# Patient Record
Sex: Female | Born: 1955
Health system: Southern US, Community
[De-identification: ages and names within clinical notes are randomized; demographics above are authoritative.]

## PROBLEM LIST (undated history)

## (undated) DIAGNOSIS — T8859XA Other complications of anesthesia, initial encounter: Secondary | ICD-10-CM

## (undated) DIAGNOSIS — E785 Hyperlipidemia, unspecified: Secondary | ICD-10-CM

## (undated) DIAGNOSIS — Z9889 Other specified postprocedural states: Secondary | ICD-10-CM

## (undated) DIAGNOSIS — F419 Anxiety disorder, unspecified: Secondary | ICD-10-CM

## (undated) DIAGNOSIS — Z8719 Personal history of other diseases of the digestive system: Secondary | ICD-10-CM

## (undated) DIAGNOSIS — Z9289 Personal history of other medical treatment: Secondary | ICD-10-CM

## (undated) DIAGNOSIS — M797 Fibromyalgia: Secondary | ICD-10-CM

## (undated) DIAGNOSIS — Z85828 Personal history of other malignant neoplasm of skin: Secondary | ICD-10-CM

## (undated) DIAGNOSIS — J40 Bronchitis, not specified as acute or chronic: Secondary | ICD-10-CM

## (undated) DIAGNOSIS — F32A Depression, unspecified: Secondary | ICD-10-CM

## (undated) DIAGNOSIS — D1800 Hemangioma unspecified site: Secondary | ICD-10-CM

## (undated) DIAGNOSIS — T4145XA Adverse effect of unspecified anesthetic, initial encounter: Secondary | ICD-10-CM

## (undated) DIAGNOSIS — I10 Essential (primary) hypertension: Secondary | ICD-10-CM

## (undated) DIAGNOSIS — Z972 Presence of dental prosthetic device (complete) (partial): Secondary | ICD-10-CM

## (undated) DIAGNOSIS — Z973 Presence of spectacles and contact lenses: Secondary | ICD-10-CM

## (undated) DIAGNOSIS — I700 Atherosclerosis of aorta: Secondary | ICD-10-CM

## (undated) DIAGNOSIS — K219 Gastro-esophageal reflux disease without esophagitis: Secondary | ICD-10-CM

## (undated) DIAGNOSIS — M069 Rheumatoid arthritis, unspecified: Secondary | ICD-10-CM

## (undated) DIAGNOSIS — R519 Headache, unspecified: Secondary | ICD-10-CM

## (undated) DIAGNOSIS — M1990 Unspecified osteoarthritis, unspecified site: Secondary | ICD-10-CM

## (undated) DIAGNOSIS — R112 Nausea with vomiting, unspecified: Secondary | ICD-10-CM

## (undated) DIAGNOSIS — Z8619 Personal history of other infectious and parasitic diseases: Secondary | ICD-10-CM

## (undated) DIAGNOSIS — C801 Malignant (primary) neoplasm, unspecified: Secondary | ICD-10-CM

## (undated) DIAGNOSIS — I2510 Atherosclerotic heart disease of native coronary artery without angina pectoris: Secondary | ICD-10-CM

## (undated) DIAGNOSIS — J3800 Paralysis of vocal cords and larynx, unspecified: Secondary | ICD-10-CM

## (undated) DIAGNOSIS — J189 Pneumonia, unspecified organism: Secondary | ICD-10-CM

## (undated) DIAGNOSIS — L821 Other seborrheic keratosis: Secondary | ICD-10-CM

## (undated) HISTORY — DX: Hemangioma unspecified site: D18.00

## (undated) HISTORY — DX: Gastro-esophageal reflux disease without esophagitis: K21.9

## (undated) HISTORY — DX: Other seborrheic keratosis: L82.1

## (undated) HISTORY — DX: Essential (primary) hypertension: I10

## (undated) HISTORY — PX: APPENDECTOMY: SHX54

## (undated) HISTORY — DX: Hyperlipidemia, unspecified: E78.5

## (undated) HISTORY — PX: KNEE ARTHROSCOPY: SUR90

## (undated) HISTORY — PX: OTHER SURGICAL HISTORY: SHX169

## (undated) HISTORY — DX: Rheumatoid arthritis, unspecified: M06.9

## (undated) HISTORY — PX: BREAST BIOPSY: SHX20

---

## 1993-02-22 HISTORY — PX: ABDOMINAL HYSTERECTOMY: SHX81

## 1995-02-23 HISTORY — PX: CHOLECYSTECTOMY: SHX55

## 1998-03-13 ENCOUNTER — Other Ambulatory Visit: Admission: RE | Admit: 1998-03-13 | Discharge: 1998-03-13 | Payer: Self-pay | Admitting: Obstetrics and Gynecology

## 1999-05-22 ENCOUNTER — Other Ambulatory Visit: Admission: RE | Admit: 1999-05-22 | Discharge: 1999-05-22 | Payer: Self-pay | Admitting: Obstetrics and Gynecology

## 2000-06-02 ENCOUNTER — Other Ambulatory Visit: Admission: RE | Admit: 2000-06-02 | Discharge: 2000-06-02 | Payer: Self-pay | Admitting: Obstetrics and Gynecology

## 2000-08-13 ENCOUNTER — Encounter: Admission: RE | Admit: 2000-08-13 | Discharge: 2000-08-13 | Payer: Self-pay

## 2000-08-16 ENCOUNTER — Encounter: Admission: RE | Admit: 2000-08-16 | Discharge: 2000-08-16 | Payer: Self-pay

## 2001-07-10 ENCOUNTER — Encounter: Admission: RE | Admit: 2001-07-10 | Discharge: 2001-09-05 | Payer: Self-pay | Admitting: Specialist

## 2001-10-14 ENCOUNTER — Emergency Department (HOSPITAL_COMMUNITY): Admission: EM | Admit: 2001-10-14 | Discharge: 2001-10-14 | Payer: Self-pay

## 2001-11-27 ENCOUNTER — Other Ambulatory Visit: Admission: RE | Admit: 2001-11-27 | Discharge: 2001-11-27 | Payer: Self-pay | Admitting: Obstetrics and Gynecology

## 2002-04-03 ENCOUNTER — Encounter: Payer: Self-pay | Admitting: Obstetrics and Gynecology

## 2002-04-03 ENCOUNTER — Encounter: Admission: RE | Admit: 2002-04-03 | Discharge: 2002-04-03 | Payer: Self-pay | Admitting: Obstetrics and Gynecology

## 2003-01-28 ENCOUNTER — Encounter: Admission: RE | Admit: 2003-01-28 | Discharge: 2003-02-05 | Payer: Self-pay | Admitting: Family Medicine

## 2004-05-08 ENCOUNTER — Encounter: Admission: RE | Admit: 2004-05-08 | Discharge: 2004-05-08 | Payer: Self-pay | Admitting: Obstetrics and Gynecology

## 2004-11-18 ENCOUNTER — Ambulatory Visit: Payer: Self-pay | Admitting: Internal Medicine

## 2004-11-23 ENCOUNTER — Ambulatory Visit: Payer: Self-pay | Admitting: Internal Medicine

## 2005-03-31 LAB — HM COLONOSCOPY: HM Colonoscopy: NORMAL

## 2005-04-30 ENCOUNTER — Ambulatory Visit (HOSPITAL_COMMUNITY): Admission: RE | Admit: 2005-04-30 | Discharge: 2005-04-30 | Payer: Self-pay | Admitting: Gastroenterology

## 2005-05-05 ENCOUNTER — Ambulatory Visit (HOSPITAL_COMMUNITY): Admission: RE | Admit: 2005-05-05 | Discharge: 2005-05-05 | Payer: Self-pay | Admitting: Gastroenterology

## 2005-05-13 ENCOUNTER — Encounter: Admission: RE | Admit: 2005-05-13 | Discharge: 2005-05-13 | Payer: Self-pay | Admitting: Obstetrics and Gynecology

## 2006-05-02 ENCOUNTER — Encounter: Admission: RE | Admit: 2006-05-02 | Discharge: 2006-05-18 | Payer: Self-pay | Admitting: Podiatry

## 2006-05-16 ENCOUNTER — Encounter: Admission: RE | Admit: 2006-05-16 | Discharge: 2006-05-16 | Payer: Self-pay | Admitting: Obstetrics and Gynecology

## 2006-06-08 ENCOUNTER — Inpatient Hospital Stay (HOSPITAL_COMMUNITY): Admission: EM | Admit: 2006-06-08 | Discharge: 2006-06-08 | Payer: Self-pay | Admitting: Emergency Medicine

## 2006-06-22 ENCOUNTER — Ambulatory Visit: Payer: Self-pay | Admitting: Cardiology

## 2006-07-04 ENCOUNTER — Ambulatory Visit: Payer: Self-pay

## 2006-07-04 ENCOUNTER — Encounter: Payer: Self-pay | Admitting: Cardiology

## 2006-07-07 ENCOUNTER — Ambulatory Visit: Payer: Self-pay

## 2006-09-20 ENCOUNTER — Ambulatory Visit: Payer: Self-pay | Admitting: Cardiology

## 2006-09-20 LAB — CONVERTED CEMR LAB
ALT: 19 units/L (ref 0–35)
AST: 21 units/L (ref 0–37)
Albumin: 3.5 g/dL (ref 3.5–5.2)
Alkaline Phosphatase: 73 units/L (ref 39–117)
Bilirubin, Direct: 0.1 mg/dL (ref 0.0–0.3)
Cholesterol: 184 mg/dL (ref 0–200)
HDL: 75.1 mg/dL (ref >39.0)
LDL Cholesterol: 84 mg/dL (ref 0–99)
Total Bilirubin: 0.4 mg/dL (ref 0.3–1.2)
Total CHOL/HDL Ratio: 2.5
Total Protein: 6.6 g/dL (ref 6.0–8.3)
Triglycerides: 126 mg/dL (ref 0–149)
VLDL: 25 mg/dL (ref 0–40)

## 2006-10-15 ENCOUNTER — Encounter: Admission: RE | Admit: 2006-10-15 | Discharge: 2006-10-15 | Payer: Self-pay | Admitting: Family Medicine

## 2006-12-08 ENCOUNTER — Encounter: Admission: RE | Admit: 2006-12-08 | Discharge: 2006-12-14 | Payer: Self-pay | Admitting: Specialist

## 2007-02-23 LAB — CONVERTED CEMR LAB: Pap Smear: NORMAL

## 2007-03-07 ENCOUNTER — Ambulatory Visit (HOSPITAL_BASED_OUTPATIENT_CLINIC_OR_DEPARTMENT_OTHER): Admission: RE | Admit: 2007-03-07 | Discharge: 2007-03-07 | Payer: Self-pay | Admitting: Orthopedic Surgery

## 2007-05-18 ENCOUNTER — Encounter: Admission: RE | Admit: 2007-05-18 | Discharge: 2007-05-18 | Payer: Self-pay | Admitting: Obstetrics and Gynecology

## 2007-05-22 ENCOUNTER — Ambulatory Visit (HOSPITAL_COMMUNITY): Admission: RE | Admit: 2007-05-22 | Discharge: 2007-05-22 | Payer: Self-pay | Admitting: Orthopedic Surgery

## 2007-05-22 ENCOUNTER — Encounter (INDEPENDENT_AMBULATORY_CARE_PROVIDER_SITE_OTHER): Payer: Self-pay | Admitting: Orthopedic Surgery

## 2007-05-22 ENCOUNTER — Ambulatory Visit: Payer: Self-pay | Admitting: *Deleted

## 2007-08-21 ENCOUNTER — Encounter: Admission: RE | Admit: 2007-08-21 | Discharge: 2007-10-05 | Payer: Self-pay | Admitting: Specialist

## 2008-04-10 ENCOUNTER — Encounter: Payer: Self-pay | Admitting: Family Medicine

## 2008-04-22 ENCOUNTER — Inpatient Hospital Stay (HOSPITAL_COMMUNITY): Admission: RE | Admit: 2008-04-22 | Discharge: 2008-04-24 | Payer: Self-pay | Admitting: Orthopedic Surgery

## 2008-05-21 ENCOUNTER — Encounter: Admission: RE | Admit: 2008-05-21 | Discharge: 2008-08-19 | Payer: Self-pay | Admitting: Orthopedic Surgery

## 2008-08-20 ENCOUNTER — Encounter: Admission: RE | Admit: 2008-08-20 | Discharge: 2008-11-18 | Payer: Self-pay | Admitting: Orthopedic Surgery

## 2008-08-23 ENCOUNTER — Encounter: Admission: RE | Admit: 2008-08-23 | Discharge: 2008-08-23 | Payer: Self-pay | Admitting: Family Medicine

## 2008-08-27 ENCOUNTER — Ambulatory Visit: Payer: Self-pay | Admitting: Family Medicine

## 2008-08-27 DIAGNOSIS — S81009A Unspecified open wound, unspecified knee, initial encounter: Secondary | ICD-10-CM | POA: Insufficient documentation

## 2008-08-27 DIAGNOSIS — I10 Essential (primary) hypertension: Secondary | ICD-10-CM

## 2008-08-27 DIAGNOSIS — S91009A Unspecified open wound, unspecified ankle, initial encounter: Secondary | ICD-10-CM

## 2008-08-27 DIAGNOSIS — L821 Other seborrheic keratosis: Secondary | ICD-10-CM

## 2008-08-27 DIAGNOSIS — E7800 Pure hypercholesterolemia, unspecified: Secondary | ICD-10-CM | POA: Insufficient documentation

## 2008-08-27 DIAGNOSIS — E785 Hyperlipidemia, unspecified: Secondary | ICD-10-CM

## 2008-08-27 DIAGNOSIS — S81809A Unspecified open wound, unspecified lower leg, initial encounter: Secondary | ICD-10-CM

## 2008-08-27 DIAGNOSIS — K219 Gastro-esophageal reflux disease without esophagitis: Secondary | ICD-10-CM | POA: Insufficient documentation

## 2008-08-27 HISTORY — DX: Other seborrheic keratosis: L82.1

## 2008-08-27 HISTORY — DX: Gastro-esophageal reflux disease without esophagitis: K21.9

## 2008-08-27 HISTORY — DX: Hyperlipidemia, unspecified: E78.5

## 2008-08-27 HISTORY — DX: Essential (primary) hypertension: I10

## 2008-08-29 ENCOUNTER — Ambulatory Visit (HOSPITAL_COMMUNITY): Admission: RE | Admit: 2008-08-29 | Discharge: 2008-08-30 | Payer: Self-pay | Admitting: Orthopedic Surgery

## 2008-09-17 ENCOUNTER — Ambulatory Visit: Payer: Self-pay | Admitting: Family Medicine

## 2008-09-17 ENCOUNTER — Encounter: Payer: Self-pay | Admitting: Family Medicine

## 2008-10-03 ENCOUNTER — Ambulatory Visit: Payer: Self-pay | Admitting: Family Medicine

## 2008-10-03 DIAGNOSIS — D1800 Hemangioma unspecified site: Secondary | ICD-10-CM | POA: Insufficient documentation

## 2008-10-03 DIAGNOSIS — R609 Edema, unspecified: Secondary | ICD-10-CM | POA: Insufficient documentation

## 2008-10-03 HISTORY — DX: Hemangioma unspecified site: D18.00

## 2008-12-30 ENCOUNTER — Ambulatory Visit: Payer: Self-pay | Admitting: Family Medicine

## 2008-12-30 DIAGNOSIS — M545 Low back pain, unspecified: Secondary | ICD-10-CM | POA: Insufficient documentation

## 2008-12-30 DIAGNOSIS — J019 Acute sinusitis, unspecified: Secondary | ICD-10-CM | POA: Insufficient documentation

## 2009-03-06 ENCOUNTER — Encounter: Admission: RE | Admit: 2009-03-06 | Discharge: 2009-06-04 | Payer: Self-pay | Admitting: Orthopedic Surgery

## 2009-04-02 ENCOUNTER — Telehealth (INDEPENDENT_AMBULATORY_CARE_PROVIDER_SITE_OTHER): Payer: Self-pay | Admitting: *Deleted

## 2009-04-21 ENCOUNTER — Telehealth: Payer: Self-pay | Admitting: Family Medicine

## 2009-08-19 ENCOUNTER — Ambulatory Visit: Payer: Self-pay | Admitting: Family Medicine

## 2009-08-19 LAB — CONVERTED CEMR LAB
ALT: 24 units/L (ref 0–35)
AST: 24 units/L (ref 0–37)
Albumin: 4 g/dL (ref 3.5–5.2)
Alkaline Phosphatase: 69 units/L (ref 39–117)
BUN: 16 mg/dL (ref 6–23)
Basophils Absolute: 0 10*3/uL (ref 0.0–0.1)
Basophils Relative: 0.7 % (ref 0.0–3.0)
Bilirubin Urine: NEGATIVE
Bilirubin, Direct: 0.2 mg/dL (ref 0.0–0.3)
CO2: 26 meq/L (ref 19–32)
Calcium: 9.3 mg/dL (ref 8.4–10.5)
Chloride: 110 meq/L (ref 96–112)
Cholesterol: 165 mg/dL (ref 0–200)
Creatinine, Ser: 0.5 mg/dL (ref 0.4–1.2)
Eosinophils Absolute: 0.1 10*3/uL (ref 0.0–0.7)
Eosinophils Relative: 2.3 % (ref 0.0–5.0)
GFR calc non Af Amer: 125.27 mL/min (ref >60)
Glucose, Bld: 92 mg/dL (ref 70–99)
Glucose, Urine, Semiquant: NEGATIVE
HCT: 39.5 % (ref 36.0–46.0)
HDL: 66.6 mg/dL (ref >39.00)
Hemoglobin: 13.2 g/dL (ref 12.0–15.0)
Ketones, urine, test strip: NEGATIVE
LDL Cholesterol: 62 mg/dL (ref 0–99)
Lymphocytes Relative: 41.6 % (ref 12.0–46.0)
Lymphs Abs: 2.2 10*3/uL (ref 0.7–4.0)
MCHC: 33.3 g/dL (ref 30.0–36.0)
MCV: 94.2 fL (ref 78.0–100.0)
Monocytes Absolute: 0.4 10*3/uL (ref 0.1–1.0)
Monocytes Relative: 7.9 % (ref 3.0–12.0)
Neutro Abs: 2.5 10*3/uL (ref 1.4–7.7)
Neutrophils Relative %: 47.5 % (ref 43.0–77.0)
Nitrite: NEGATIVE
Platelets: 286 10*3/uL (ref 150.0–400.0)
Potassium: 4.2 meq/L (ref 3.5–5.1)
Protein, U semiquant: NEGATIVE
RBC: 4.2 M/uL (ref 3.87–5.11)
RDW: 13.7 % (ref 11.5–14.6)
Sodium: 143 meq/L (ref 135–145)
Specific Gravity, Urine: 1.025
TSH: 1.9 microintl units/mL (ref 0.35–5.50)
Total Bilirubin: 0.6 mg/dL (ref 0.3–1.2)
Total CHOL/HDL Ratio: 2
Total Protein: 6.7 g/dL (ref 6.0–8.3)
Triglycerides: 181 mg/dL — ABNORMAL HIGH (ref 0.0–149.0)
Urobilinogen, UA: 0.2
VLDL: 36.2 mg/dL (ref 0.0–40.0)
WBC Urine, dipstick: NEGATIVE
WBC: 5.3 10*3/uL (ref 4.5–10.5)
pH: 5.5

## 2009-08-28 ENCOUNTER — Ambulatory Visit: Payer: Self-pay | Admitting: Family Medicine

## 2009-08-28 LAB — HM MAMMOGRAPHY: HM Mammogram: NEGATIVE

## 2009-09-11 ENCOUNTER — Encounter: Admission: RE | Admit: 2009-09-11 | Discharge: 2009-09-11 | Payer: Self-pay | Admitting: Family Medicine

## 2009-09-17 ENCOUNTER — Telehealth: Payer: Self-pay | Admitting: Family Medicine

## 2009-09-20 ENCOUNTER — Encounter: Payer: Self-pay | Admitting: Family Medicine

## 2009-10-30 ENCOUNTER — Encounter: Payer: Self-pay | Admitting: Family Medicine

## 2009-10-31 ENCOUNTER — Ambulatory Visit: Payer: Self-pay | Admitting: Family Medicine

## 2009-11-03 LAB — CONVERTED CEMR LAB
CRP, High Sensitivity: 6.99 — ABNORMAL HIGH (ref 0.00–5.00)
Sed Rate: 24 mm/hr — ABNORMAL HIGH (ref 0–22)

## 2009-12-10 ENCOUNTER — Ambulatory Visit: Payer: Self-pay | Admitting: Family Medicine

## 2009-12-10 DIAGNOSIS — F329 Major depressive disorder, single episode, unspecified: Secondary | ICD-10-CM | POA: Insufficient documentation

## 2009-12-10 DIAGNOSIS — F3289 Other specified depressive episodes: Secondary | ICD-10-CM | POA: Insufficient documentation

## 2009-12-10 DIAGNOSIS — R21 Rash and other nonspecific skin eruption: Secondary | ICD-10-CM | POA: Insufficient documentation

## 2010-01-12 ENCOUNTER — Ambulatory Visit: Payer: Self-pay | Admitting: Family Medicine

## 2010-01-12 DIAGNOSIS — M26629 Arthralgia of temporomandibular joint, unspecified side: Secondary | ICD-10-CM | POA: Insufficient documentation

## 2010-01-28 ENCOUNTER — Telehealth: Payer: Self-pay | Admitting: Family Medicine

## 2010-03-06 ENCOUNTER — Telehealth: Payer: Self-pay | Admitting: Family Medicine

## 2010-03-16 ENCOUNTER — Encounter: Payer: Self-pay | Admitting: Family Medicine

## 2010-03-24 ENCOUNTER — Telehealth: Payer: Self-pay | Admitting: Family Medicine

## 2010-03-26 NOTE — Assessment & Plan Note (Signed)
Summary: 13 day rov/njr/pt rsc/cjr   Vital Signs:  Patient profile:   55 year old female Menstrual status:  hysterectomy Temp:     98.2 degrees F oral BP sitting:   130 / 80  (left arm) Cuff size:   large  Vitals Entered By: Sid Falcon LPN (October 03, 2008 1:35 PM) CC: right lower leg punch biopsy 13 day suture removal   History of Present Illness: Patient here for followup recent biopsy leg lesion which had been present for 6 months.  Pathology came back angioma with some features consistent with lichen simplex chronicus. Wound healing well. No drainage. She is using topical antibiotic daily.  Patient has some problems with fluid retention lower extremities. No orthopnea. Recent total knee replacement in March. Orthopedist suggested possible mild diuretic. She would like something to take p.r.n.  Allergies: 1)  Penicillin V Potassium (Penicillin V Potassium) 2)  Codeine Sulfate (Codeine Sulfate)  Past History:  Past Medical History: Last updated: 08/27/2008 Arthritis GERD Hyperlipidemia Hypertension Blood transfusion  Review of Systems       The patient complains of peripheral edema.  The patient denies chest pain, syncope, dyspnea on exertion, and prolonged cough.    Physical Exam  General:  patient is alert and in no distress Lungs:  Normal respiratory effort, chest expands symmetrically. Lungs are clear to auscultation, no crackles or wheezes. Heart:  Normal rate and regular rhythm. S1 and S2 normal without gallop, murmur, click, rub or other extra sounds. Extremities:  right lower leg anteriorly reveals site of previous previous biopsy good granulation tissues no purulent drainage. Is healing well. 2 sutures are removed without difficulty. No localized tenderness.   Impression & Recommendations:  Problem # 1:  ANGIOMA (ICD-228.00) patient reassured this is a benign process. Continue good local wound care follow up p.r.n.  Problem # 2:  EDEMA  (ICD-782.3)  recommend support hose and frequent elevation and reduction of sodium and HCTZ 25 mg one half tablet daily as needed  Her updated medication list for this problem includes:    Hydrochlorothiazide 25 Mg Tabs (Hydrochlorothiazide) ..... One half tablet daily  Complete Medication List: 1)  Enbrel 50 Mg/ml Soln (Etanercept) .... Once weekly 2)  Methotrexate 2.5 Mg Tabs (Methotrexate sodium) .Marland Kitchen.. 10 tabs once a week 3)  Estrace 1 Mg Tabs (Estradiol) .... Once daily 4)  Folic Acid 1 Mg Tabs (Folic acid) .... 2 tabs daily 5)  Lisinopril 20 Mg Tabs (Lisinopril) .... Once daily 6)  Prednisone 5 Mg Tabs (Prednisone) .... Once daily 7)  Prilosec 20 Mg Cpdr (Omeprazole) .... 2 daily 8)  Aspirin 81 Mg Tabs (Aspirin) .... Once daily 9)  Tylenol Arthritis Pain 650 Mg Cr-tabs (Acetaminophen) .... As needed 10)  Zocor 20 Mg Tabs (Simvastatin) .... Once daily 11)  Ambien 10 Mg Tabs (Zolpidem tartrate) .... At bedtime 12)  Hydrochlorothiazide 25 Mg Tabs (Hydrochlorothiazide) .... One half tablet daily  Patient Instructions: 1)  Limit your Sodium(salt) .  2)  Elevate feet as much as possible. 3)  Consider support hose for edema. Prescriptions: HYDROCHLOROTHIAZIDE 25 MG TABS (HYDROCHLOROTHIAZIDE) one half tablet daily  #30 x 5   Entered and Authorized by:   Evelena Peat MD   Signed by:   Evelena Peat MD on 10/03/2008   Method used:   Faxed to ...       [facility]       [address]       [place]  [place], [place]  [address]       Ph: [phone] or [phone]       Fax: [phone]   RxID:   [id number]

## 2010-03-26 NOTE — Assessment & Plan Note (Signed)
Summary: new to [facility]-? spider bite//ccm   Vital Signs:  Patient profile:   55 year old female Menstrual status:  hysterectomy Height:      65.25 inches Weight:      252 pounds BMI:     41.76 Temp:     98.1 degrees F oral Pulse rate:   72 / minute Pulse rhythm:   regular Resp:     12 per minute BP sitting:   140 / 80  (left arm) Cuff size:   regular  Vitals Entered By: Sid Falcon LPN (August 27, 452 8:47 AM)  O2 Flow:  Room air CC: New pt to establish, right lower leg spider bite, not healing  years   days  Menstrual Status hysterectomy Last PAP Result normal   History of Present Illness: Patient is seen to establish care. She is here with left forearm scaly skin lesion that she wishes to have evaluated and reports over a 6 month period right anterior lower leg blister or lesion that comes and goes. She states that this appears as a vesicle with mostly clear fluid and this eventually heals over but has been recurrent over 6 months time. No drainage. No associated pain. Denies history of trauma to this region. No prior history of skin cancer.  Preventive Screening-Counseling & Management  Alcohol-Tobacco     Smoking Status: never  Caffeine-Diet-Exercise     Does Patient Exercise: yes  Allergies (verified): 1)  ! Penicillin V Potassium (Penicillin V Potassium) 2)  ! Codeine Sulfate (Codeine Sulfate)  Past History:  Past Medical History: Arthritis GERD Hyperlipidemia Hypertension Blood transfusion  Past Surgical History: Caesarean section 1980, 1982 Cholecystectomy, 1997 Hysterectomy, 1995 Foot surgery 2004, 2005, 2008 Knee scope 2007. 2008, 2009  Family History: Family History High cholesterol Family History Hypertension Family history breast cancer, mother Family history prostate cancer, father Family history lung cancer, parent Family history heart disease, parent Family history kidney disease, parent Family history stroke, parent Family history  diabetes, grandparent  Social History: Married Never Smoked Alcohol use-no Regular exercise-yes Smoking Status:  never Does Patient Exercise:  yes  Review of Systems  The patient denies anorexia, fever, weight loss, and peripheral edema.    Physical Exam  General:  Well-developed,well-nourished,in no acute distress; alert,appropriate and cooperative throughout examination Lungs:  Normal respiratory effort, chest expands symmetrically. Lungs are clear to auscultation, no crackles or wheezes. Heart:  Normal rate and regular rhythm. S1 and S2 normal without gallop, murmur, click, rub or other extra sounds. Skin:  left forearm reveals a 0.5 x 0.5 cm well-demarcated scaly nonpigmented lesion consistent with seborrheic keratosis.  right lower leg reveals 0.5 x 1 cm area of denuded epithelium with excellent granulation tissue and no drainage and no surrounding erythema. No evidence for necrosis   Impression & Recommendations:  Problem # 1:  WOUND, LEG (ICD-891.0) This appears benign with no elevated borders for other atypical features but concern is that this has been coming and going for 6 months. We recommended keeping clean with soap and water and avoid irritants such as peroxide or Betadine and reassess in 3 weeks' time. If this is not substantially healed at that time punch biopsy.  Problem # 2:  SEBORRHEIC KERATOSIS (ICD-702.19)  patient reassured this looks benign. She prefers treatment. Reviewed risk and benefits of liquid nitrogen therapy and she consented. This was treated without difficulty  Orders: Cryotherapy/Destruction benign or premalignant lesion (1st lesion)  ([address])  Complete Medication List: 1)  Enbrel 50 Mg/ml  Soln (Etanercept) .... Once weekly 2)  Methotrexate 2.5 Mg Tabs (Methotrexate sodium) .Marland Kitchen.. 10 tabs once a week 3)  Estrace 1 Mg Tabs (Estradiol) .... Once daily 4)  Folic Acid 1 Mg Tabs (Folic acid) .... 2 tabs daily 5)  Lisinopril 20 Mg Tabs (Lisinopril)  .... Once daily 6)  Prednisone 5 Mg Tabs (Prednisone) .... Once daily 7)  Prilosec 20 Mg Cpdr (Omeprazole) .... 2 daily 8)  Aspirin 81 Mg Tabs (Aspirin) .... Once daily 9)  Tylenol Arthritis Pain 650 Mg Cr-tabs (Acetaminophen) .... As needed 10)  Zocor 20 Mg Tabs (Simvastatin) .... Once daily 11)  Ambien 10 Mg Tabs (Zolpidem tartrate) .... At bedtime  Patient Instructions: 1)  Schedule followup appointment in 3 weeks. Keep right leg wound clean with soap and water. Avoid irritants such as alcohol and Betadine. Continue topical antibiotic.  Preventive Care Screening  Mammogram:    Date:  08/23/2008    Results:  normal   Pap Smear:    Date:  02/23/2007    Results:  normal   Colonoscopy:    Date:  02/22/2005    Results:  normal     Immunization History:  Pneumovax Immunization History:    Pneumovax:  historical (02/22/2006)

## 2010-03-26 NOTE — Assessment & Plan Note (Signed)
Summary: rough red patches on face/sore to touch/cjr   Vital Signs:  Patient profile:   55 year old female Menstrual status:  hysterectomy Weight:      278 pounds Temp:     98.3 degrees F oral BP sitting:   130 / 78  (left arm) Cuff size:   large  Vitals Entered By: Sid Falcon LPN (December 10, 2009 2:31 PM)  History of Present Illness: Pt seen for the following.  She has rash above brow region past one month. Slightly sore to touch dry and scaly surface. Tried hydrocortisone cream without improvement. No known allergies.  Does not use any hair removal products or topicals that would've aggravated. No other rashes.  Increased depressive and anxiety symptoms past several months. Low motivation. Frequent crying spells. No suicidal ideation. Loss of interest in activities. No prior history of depression.  no specific stressors.  Depression History:      The patient is having a depressed mood most of the day and has a diminished interest in her usual daily activities.  Positive alarm features for depression include fatigue (loss of energy).  However, she denies significant weight loss, hypersomnia, psychomotor agitation, and recurrent thoughts of death or suicide.  The patient denies symptoms of a manic disorder including persistently & abnormally elevated mood, less need for sleep, and talkative or feels need to keep talking.         Allergies: 1)  Penicillin V Potassium (Penicillin V Potassium) 2)  Codeine Sulfate (Codeine Sulfate)  Past History:  Past Medical History: Last updated: 12/30/2008 Arthritis GERD Hyperlipidemia Hypertension Blood transfusion Rheumatoid Arthritis  Social History: Last updated: 08/27/2008 Married Never Smoked Alcohol use-no Regular exercise-yes  Review of Systems      See HPI  Physical Exam  General:  Well-developed,well-nourished,in no acute distress; alert,appropriate and cooperative throughout examination Eyes:  pupils equal, pupils  round, and pupils reactive to light.   Mouth:  Oral mucosa and oropharynx without lesions or exudates.  Teeth in good repair. Neck:  No deformities, masses, or tenderness noted. Lungs:  Normal respiratory effort, chest expands symmetrically. Lungs are clear to auscultation, no crackles or wheezes. Heart:  Normal rate and regular rhythm. S1 and S2 normal without gallop, murmur, click, rub or other extra sounds. Skin:  Patient has some patches of rash which are erythematous with slightly rough non-scaly surface. No pustules. Nontender to palpation Cervical Nodes:  No lymphadenopathy noted Psych:  normally interactive, good eye contact, not anxious appearing, and depressed affect.     Impression & Recommendations:  Problem # 1:  DEPRESSION (ICD-311) Assessment New  Her updated medication list for this problem includes:    Sertraline Hcl 50 Mg Tabs (Sertraline hcl) ..... One by mouth once daily  Problem # 2:  SKIN RASH (ICD-782.1) Assessment: New  Her updated medication list for this problem includes:    Triamcinolone Acetonide 0.5 % Crea (Triamcinolone acetonide) .Marland Kitchen... Apply to affected rash two times a day as needed  Complete Medication List: 1)  Enbrel 50 Mg/ml Soln (Etanercept) .... Once weekly 2)  Methotrexate 2.5 Mg Tabs (Methotrexate sodium) .... Weekly injection per beekman 3)  Estrace 1 Mg Tabs (Estradiol) .... Once daily 4)  Folic Acid 1 Mg Tabs (Folic acid) .... 2 tabs daily 5)  Lisinopril 20 Mg Tabs (Lisinopril) .... Once daily 6)  Prednisone 5 Mg Tabs (Prednisone) .... Once daily 7)  Prilosec 20 Mg Cpdr (Omeprazole) .... 2 daily 8)  Aspirin 81 Mg Tabs (Aspirin) .... Once  daily 9)  Tylenol Arthritis Pain 650 Mg Cr-tabs (Acetaminophen) .... As needed 10)  Zocor 20 Mg Tabs (Simvastatin) .... Once daily 11)  Ambien 10 Mg Tabs (Zolpidem tartrate) .... At bedtime 12)  Hydrochlorothiazide 25 Mg Tabs (Hydrochlorothiazide) .... One half tablet daily 13)  Fish Oil 500 Mg Caps  (Omega-3 fatty acids) .... Two times a day 14)  Oxycodone-acetaminophen 5-325 Mg Tabs (Oxycodone-acetaminophen) .... One to two by mouth q 4-6 hours as needed pain 15)  Triamcinolone Acetonide 0.5 % Crea (Triamcinolone acetonide) .... Apply to affected rash two times a day as needed 16)  Sertraline Hcl 50 Mg Tabs (Sertraline hcl) .... One by mouth once daily  Patient Instructions: 1)  Please schedule a follow-up appointment in 1 month.  Prescriptions: SERTRALINE HCL 50 MG TABS (SERTRALINE HCL) one by mouth once daily  #30 x 3   Entered and Authorized by:   Evelena Peat MD   Signed by:   Evelena Peat MD on 12/10/2009   Method used:   Faxed to ...       [facility] (retail)       [address]       [place], [place]  [address]       Ph: [phone] or [phone]       Fax: [phone]   RxID:   [id number] TRIAMCINOLONE ACETONIDE 0.5 % CREA (TRIAMCINOLONE ACETONIDE) apply to affected rash two times a day as needed  #15 gm x 1   Entered and Authorized by:   Evelena Peat MD   Signed by:   Evelena Peat MD on 12/10/2009   Method used:   Print then Give to Patient   RxID:   [id number]    Orders Added: 1)  Est. Patient Level IV [56433]

## 2010-03-26 NOTE — Assessment & Plan Note (Signed)
Summary: ?sinus inf/lower back pain/cjr   Vital Signs:  Patient profile:   55 year old female Menstrual status:  hysterectomy Weight:      253 pounds Temp:     98.2 degrees F oral BP sitting:   130 / 80  (left arm) Cuff size:   large  Vitals Entered By: Sid Falcon LPN (December 30, 2008 1:38 PM) CC: Sinus congestion, cough X 1 week, low badk pain X 4 days   History of Present Illness: Acute visit for the following items.  One-week history of sinus congestion with frontal sinus pressure. Yellowish to greenish nasal discharge and some cough productive of green sputum. No fever. Denies dyspnea. Patient has rheumatoid arthritis on methotrexate and Enbrel injection.  Back pain 4 days duration. Pain is sharp and moderate intensity. Occasional radiation left lower extremity to the buttock and thigh. No numbness or weakness. No loss of bladder or bowel control. Pain worse with back flexion. He takes low-dose prednisone at baseline along with aspirin. Tried Robaxin from prior prescription without relief  Allergies: 1)  Penicillin V Potassium (Penicillin V Potassium) 2)  Codeine Sulfate (Codeine Sulfate)  Past History:  Social History: Last updated: 08/27/2008 Married Never Smoked Alcohol use-no Regular exercise-yes  Past Medical History: Arthritis GERD Hyperlipidemia Hypertension Blood transfusion Rheumatoid Arthritis  Review of Systems      See HPI  Physical Exam  General:  Well-developed,well-nourished,in no acute distress; alert,appropriate and cooperative throughout examination Ears:  External ear exam shows no significant lesions or deformities.  Otoscopic examination reveals clear canals, tympanic membranes are intact bilaterally without bulging, retraction, inflammation or discharge. Hearing is grossly normal bilaterally. Nose:  nasal mucosa somewhat erythematous otherwise normal Mouth:  Oral mucosa and oropharynx without lesions or exudates.  Teeth in good  repair. Neck:  No deformities, masses, or tenderness noted. Lungs:  Normal respiratory effort, chest expands symmetrically. Lungs are clear to auscultation, no crackles or wheezes. Heart:  Normal rate and regular rhythm. S1 and S2 normal without gallop, murmur, click, rub or other extra sounds. Extremities:  straight leg raise is negative bilaterally. No pitting edema lower extremities have Neurologic:  full strength plantar flexion dorsiflexion. Normal sensory function lower extremity.  2+ ankle reflexes bilaterally. Knees difficult to obtain bilaterally   Impression & Recommendations:  Problem # 1:  SINUSITIS, ACUTE (ICD-461.9)  probably viral but given her immunosuppressed status consider cover with antibiotics  Her updated medication list for this problem includes:    Azithromycin 250 Mg Tabs (Azithromycin) .Marland Kitchen... 2 by mouth today then opne by mouth once daily for 4 days  Problem # 2:  LUMBAGO (ICD-724.2) continue muscle relaxer and pain medication to use as needed. The following medications were removed from the medication list:    Vicodin 5-500 Mg Tabs (Hydrocodone-acetaminophen) ..... One to two tablets by mouth q 4-6 hours as needed pain Her updated medication list for this problem includes:    Aspirin 81 Mg Tabs (Aspirin) ..... Once daily    Tylenol Arthritis Pain 650 Mg Cr-tabs (Acetaminophen) .Marland Kitchen... As needed    Oxycodone-acetaminophen 5-325 Mg Tabs (Oxycodone-acetaminophen) ..... One to two by mouth q 4-6 hours as needed pain  Complete Medication List: 1)  Enbrel 50 Mg/ml Soln (Etanercept) .... Once weekly 2)  Methotrexate 2.5 Mg Tabs (Methotrexate sodium) .Marland Kitchen.. 10 tabs once a week 3)  Estrace 1 Mg Tabs (Estradiol) .... Once daily 4)  Folic Acid 1 Mg Tabs (Folic acid) .... 2 tabs daily 5)  Lisinopril 20 Mg  Tabs (Lisinopril) .... Once daily 6)  Prednisone 5 Mg Tabs (Prednisone) .... Once daily 7)  Prilosec 20 Mg Cpdr (Omeprazole) .... 2 daily 8)  Aspirin 81 Mg Tabs (Aspirin)  .... Once daily 9)  Tylenol Arthritis Pain 650 Mg Cr-tabs (Acetaminophen) .... As needed 10)  Zocor 20 Mg Tabs (Simvastatin) .... Once daily 11)  Ambien 10 Mg Tabs (Zolpidem tartrate) .... At bedtime 12)  Hydrochlorothiazide 25 Mg Tabs (Hydrochlorothiazide) .... One half tablet daily 13)  Fish Oil 500 Mg Caps (Omega-3 fatty acids) .... Two times a day 14)  Azithromycin 250 Mg Tabs (Azithromycin) .... 2 by mouth today then opne by mouth once daily for 4 days 15)  Oxycodone-acetaminophen 5-325 Mg Tabs (Oxycodone-acetaminophen) .... One to two by mouth q 4-6 hours as needed pain  Patient Instructions: 1)  Acute sinusitis symptoms for less than 10 days are not helped by antibiotics. Use warm moist compresses, and over the counter decongestants( only as directed). Call if no improvement in 5-7 days, sooner if increasing pain, fever, or new symptoms.  2)  Most patients (90%) with low back pain will improve with time ( 2-6 weeks). Keep active but avoid activities that are painful. Apply moist heat and/or ice to lower back several times a day.  Prescriptions: OXYCODONE-ACETAMINOPHEN 5-325 MG TABS (OXYCODONE-ACETAMINOPHEN) one to two by mouth q 4-6 hours as needed pain  #40 x 0   Entered and Authorized by:   Evelena Peat MD   Signed by:   Evelena Peat MD on 12/30/2008   Method used:   Print then Give to Patient   RxID:   [id number] VICODIN 5-500 MG TABS (HYDROCODONE-ACETAMINOPHEN) one to two tablets by mouth q 4-6 hours as needed pain  #40 x 0   Entered and Authorized by:   Evelena Peat MD   Signed by:   Evelena Peat MD on 12/30/2008   Method used:   Print then Give to Patient   RxID:   [id number] AZITHROMYCIN 250 MG TABS (AZITHROMYCIN) 2 by mouth today then opne by mouth once daily for 4 days  #6 x 0   Entered and Authorized by:   Evelena Peat MD   Signed by:   Evelena Peat MD on 12/30/2008   Method used:   Print then Give to Patient   RxID:    [id number]  Vicodin initially written in error.  Pt states she cannot take hydrocodone.  Wrote for oxycodone.

## 2010-03-26 NOTE — Progress Notes (Signed)
Summary: [facility] predetermination form completed, mailed to [employer]  Phone Note Outgoing Call   Call placed by: Sid Falcon LPN,  January 28, 2010 9:00 AM Call placed to: Patient Summary of Call: Predetermination request form completed, mailed to BC/BS, pt informed, copy scanned to EMR Initial call taken by: Sid Falcon LPN,  January 28, 2010 9:00 AM

## 2010-03-26 NOTE — Medication Information (Signed)
Summary: Zolpidem Tartrate Approved  Zolpidem Tartrate Approved   Imported By: Maryln Gottron 09/23/2009 15:49:24  _____________________________________________________________________  External Attachment:    Type:   Image     Comment:   External Document

## 2010-03-26 NOTE — Progress Notes (Signed)
Summary: REFILL Lisinopril X 1 year  Phone Note Refill Request Message from:  Fax from Pharmacy  Refills Requested: Medication #1:  LISINOPRIL 20 MG TABS once daily [facility] [phone]     FAX---[phone]  Initial call taken by: Warnell Forester,  April 21, 2009 11:11 AM    Prescriptions: LISINOPRIL 20 MG TABS (LISINOPRIL) once daily  #90 x 3   Entered by:   Sid Falcon LPN   Authorized by:   Evelena Peat MD   Signed by:   Sid Falcon LPN on 19/14/7829   Method used:   Faxed to ...       [facility]retail[facility]       [address]       [place], [place]  [address]       Ph: [phone] or [phone]       Fax: [phone]   RxID:   [id number]

## 2010-03-26 NOTE — Assessment & Plan Note (Signed)
Summary: cpx/no pap/njr   Vital Signs:  Patient profile:   55 year old female Menstrual status:  hysterectomy Height:      63.5 inches Weight:      278 pounds BMI:     48.65 Temp:     98.3 degrees F oral BP sitting:   120 / 70  (right arm) Cuff size:   large  Vitals Entered By: Duard Brady LPN (August 29, 6642 2:10 PM)  Nutrition Counseling: Patient's BMI is greater than 25 and therefore counseled on weight management options. CC: cpx - doing well Is Patient Diabetic? No   History of Present Illness: Patient here for complete physical examination. Prior history of hysterectomy. Patient due for repeat mammogram and she will set up. Prior Pneumovax. No history of tetanus documented. Prior colonoscopy 2007.  Family history reviewed signif. for sister dying of coronary artery disease age 10 a few weeks ago. She's had multiple members die prematurely of coronary artery disease. Otherwise no new family history.    patient is preparing to start regular cycling program for exercise. Weight gain during the past year. She has some dyspnea which she attributes to weight gain. All medications reviewed and compliant with all.  Preventive Screening-Counseling & Management  Alcohol-Tobacco     Smoking Status: quit  Clinical Review Panels:  Prevention   Last Mammogram:  ASSESSMENT: Negative - BI-RADS 1^MM DIGITAL SCREENING (08/23/2008)   Last Pap Smear:  normal (02/23/2007)   Last Colonoscopy:  normal (02/22/2005)  Immunizations   Last Tetanus Booster:  Tdap (08/28/2009)   Last Pneumovax:  Historical (02/22/2006)   Allergies: 1)  Penicillin V Potassium (Penicillin V Potassium) 2)  Codeine Sulfate (Codeine Sulfate)  Past History:  Past Medical History: Last updated: 12/30/2008 Arthritis GERD Hyperlipidemia Hypertension Blood transfusion Rheumatoid Arthritis  Past Surgical History: Last updated: 08/27/2008 Caesarean section 1980, 1982 Cholecystectomy,  1997 Hysterectomy, 1995 Foot surgery 2004, 2005, 2008 Knee scope 2007. 2008, 2009  Family History: Last updated: 08/27/2008 Family History High cholesterol Family History Hypertension Family history breast cancer, mother Family history prostate cancer, father Family history lung cancer, parent Family history heart disease, parent Family history kidney disease, parent Family history stroke, parent Family history diabetes, grandparent  Social History: Last updated: 08/27/2008 Married Never Smoked Alcohol use-no Regular exercise-yes  Risk Factors: Exercise: yes (08/27/2008)  Risk Factors: Smoking Status: quit (08/28/2009) PMH-FH-SH reviewed for relevance  Social History: Smoking Status:  quit  Review of Systems       The patient complains of weight gain and dyspnea on exertion.  The patient denies anorexia, fever, weight loss, vision loss, decreased hearing, chest pain, syncope, peripheral edema, prolonged cough, headaches, hemoptysis, abdominal pain, melena, hematochezia, severe indigestion/heartburn, hematuria, incontinence, genital sores, muscle weakness, suspicious skin lesions, difficulty walking, depression, enlarged lymph nodes, and breast masses.    Physical Exam  General:  Well-developed,well-nourished,in no acute distress; alert,appropriate and cooperative throughout examination Head:  Normocephalic and atraumatic without obvious abnormalities. No apparent alopecia or balding. Eyes:  No corneal or conjunctival inflammation noted. EOMI. Perrla. Funduscopic exam benign, without hemorrhages, exudates or papilledema. Vision grossly normal. Ears:  External ear exam shows no significant lesions or deformities.  Otoscopic examination reveals clear canals, tympanic membranes are intact bilaterally without bulging, retraction, inflammation or discharge. Hearing is grossly normal bilaterally. Mouth:  Oral mucosa and oropharynx without lesions or exudates.  Teeth in good  repair. Neck:  No deformities, masses, or tenderness noted. Breasts:  No mass, nodules, thickening, tenderness, bulging, retraction,  inflamation, nipple discharge or skin changes noted.   Lungs:  Normal respiratory effort, chest expands symmetrically. Lungs are clear to auscultation, no crackles or wheezes. Heart:  normal rate and regular rhythm.   Abdomen:  soft, non-tender, no masses, no hepatomegaly, and no splenomegaly.   Genitalia:  hx hysterectomy. Msk:  No deformity or scoliosis noted of thoracic or lumbar spine.   Extremities:  no pitting edema Neurologic:  alert & oriented X3, cranial nerves II-XII intact, strength normal in all extremities, and gait normal.   Skin:  no rashes.   Cervical Nodes:  No lymphadenopathy noted Psych:  normally interactive, good eye contact, not anxious appearing, and not depressed appearing.     Impression & Recommendations:  Problem # 1:  Preventive Health Care (ICD-V70.0) patient needs to work on weight loss. Received DEXA scan per rheumatologist. Pneumovax up-to-date. Needs tetanus booster. Schedule mammogram. Not using calcium and vitamin D and supplement recommended. No indication for Pap smear as she's had prior hysterectomy discussed discontinuation of Estrace which she takes for hot flushes and she is reluctant.  Complete Medication List: 1)  Enbrel 50 Mg/ml Soln (Etanercept) .... Once weekly 2)  Methotrexate 2.5 Mg Tabs (Methotrexate sodium) .Marland Kitchen.. 10 tabs once a week 3)  Estrace 1 Mg Tabs (Estradiol) .... Once daily 4)  Folic Acid 1 Mg Tabs (Folic acid) .... 2 tabs daily 5)  Lisinopril 20 Mg Tabs (Lisinopril) .... Once daily 6)  Prednisone 5 Mg Tabs (Prednisone) .... Once daily 7)  Prilosec 20 Mg Cpdr (Omeprazole) .... 2 daily 8)  Aspirin 81 Mg Tabs (Aspirin) .... Once daily 9)  Tylenol Arthritis Pain 650 Mg Cr-tabs (Acetaminophen) .... As needed 10)  Zocor 20 Mg Tabs (Simvastatin) .... Once daily 11)  Ambien 10 Mg Tabs (Zolpidem tartrate)  .... At bedtime 12)  Hydrochlorothiazide 25 Mg Tabs (Hydrochlorothiazide) .... One half tablet daily 13)  Fish Oil 500 Mg Caps (Omega-3 fatty acids) .... Two times a day 14)  Oxycodone-acetaminophen 5-325 Mg Tabs (Oxycodone-acetaminophen) .... One to two by mouth q 4-6 hours as needed pain  Other Orders: Tdap => 46yrs IM (45409) Admin 1st Vaccine (81191)  Patient Instructions: 1)  It is important that you exercise reguarly at least 20 minutes 5 times a week. If you develop chest pain, have severe difficulty breathing, or feel very tired, stop exercising immediately and seek medical attention.  2)  You need to lose weight. Consider a lower calorie diet and regular exercise.  3)  Take calcium +vitamin D daily. Calcium 1,200 mg/day and Vit D 800 International Units/day Prescriptions: AMBIEN 10 MG TABS (ZOLPIDEM TARTRATE) at bedtime  #90 x 1   Entered and Authorized by:   Evelena Peat MD   Signed by:   Evelena Peat MD on 08/28/2009   Method used:   Print then Give to Patient   RxID:   [id number] HYDROCHLOROTHIAZIDE 25 MG TABS (HYDROCHLOROTHIAZIDE) one half tablet daily  #90 x 3   Entered and Authorized by:   Evelena Peat MD   Signed by:   Evelena Peat MD on 08/28/2009   Method used:   Faxed to ...       [facility] (mail-order)             , [place]         Ph: [phone]       Fax: [phone]   RxID:   [id number] ZOCOR 20 MG TABS (SIMVASTATIN) once daily  #90 x 3   Entered and Authorized  by:   Evelena Peat MD   Signed by:   Evelena Peat MD on 08/28/2009   Method used:   Faxed to ...       [facility] (mail-order)             , [place]         Ph: [phone]       Fax: [phone]   RxID:   [id number] PRILOSEC 20 MG CPDR (OMEPRAZOLE) 2 daily  #180 x 3   Entered and Authorized by:   Evelena Peat MD   Signed by:   Evelena Peat MD on 08/28/2009   Method used:   Faxed to ...       [facility] (mail-order)             , [place]         Ph: [phone]       Fax:  [phone]   RxID:   [id number] LISINOPRIL 20 MG TABS (LISINOPRIL) once daily  #90 x 3   Entered and Authorized by:   Evelena Peat MD   Signed by:   Evelena Peat MD on 08/28/2009   Method used:   Faxed to ...       [facility] (mail-order)             , [place]         Ph: [phone]       Fax: [phone]   RxID:   [id number] ESTRACE 1 MG TABS (ESTRADIOL) once daily  #90 x 3   Entered and Authorized by:   Evelena Peat MD   Signed by:   Evelena Peat MD on 08/28/2009   Method used:   Faxed to ...       [facility] (mail-order)             , [place]         Ph: [phone]       Fax: [phone]   RxID:   [id number]     Immunizations Administered:  Tetanus Vaccine:    Vaccine Type: Tdap    Site: left deltoid    Mfr: GlaxoSmithKline    Dose: 0.5 ml    Route: IM    Given by: Sid Falcon LPN    Exp. Date: 05/16/2011    Lot #: [id number]

## 2010-03-26 NOTE — Progress Notes (Signed)
Summary: No longer using [facility], sent to [facility]  Phone Note Call from Patient   Caller: Patient Call For: Felicia Peat MD Summary of Call: Pt insurance has changed, no longer [facility].  Sent meds requested to [facility] X 1 year Initial call taken by: Sid Falcon LPN,  March 06, 2010 9:52 AM    Prescriptions: HYDROCHLOROTHIAZIDE 25 MG TABS (HYDROCHLOROTHIAZIDE) one half tablet daily  #90 x 3   Entered by:   Sid Falcon LPN   Authorized by:   Felicia Peat MD   Signed by:   Sid Falcon LPN on 11/91/4782   Method used:   Faxed to ...       Hospital doctor (retail)       [address]       [place], [place]  [address]       Ph: [phone] or [phone]       Fax: [phone]   RxID:   [id number] ZOCOR 20 MG TABS (SIMVASTATIN) once daily  #90 x 3   Entered by:   Sid Falcon LPN   Authorized by:   Felicia Peat MD   Signed by:   Sid Falcon LPN on 75/64/3329   Method used:   Faxed to ...       Hospital doctor (retail)       [address]       [place], [place]  [address]       Ph: [phone] or [phone]       Fax: [phone]   RxID:   [id number] PRILOSEC 20 MG CPDR (OMEPRAZOLE) 2 daily  #180 x 3   Entered by:   Sid Falcon LPN   Authorized by:   Felicia Peat MD   Signed by:   Sid Falcon LPN on 37/11/6267   Method used:   Faxed to ...       Hospital doctor (retail)       [address]       [place], [place]  [address]       Ph: [phone] or [phone]       Fax: [phone]   RxID:   [id number] LISINOPRIL 20 MG TABS (LISINOPRIL) once daily  #90 x 3   Entered by:   Sid Falcon LPN   Authorized by:   Felicia Peat MD   Signed by:   Sid Falcon LPN on 36/14/4315   Method used:   Faxed to ...       Hospital doctor (retail)       [address]       [place], [place]   [address]       Ph: [phone] or [phone]       Fax: [phone]   RxID:   [id number] ESTRACE 1 MG TABS (ESTRADIOL) once daily  #90 x 3   Entered by:   Sid Falcon LPN   Authorized by:   Felicia Peat MD   Signed by:   Sid Falcon LPN on 53/29/9242   Method used:   Faxed to ...       Hospital doctor (retail)       [address]       [place], [place]  [address]  Ph: [phone] or [phone]       Fax: [phone]   RxID:   [id number]

## 2010-03-26 NOTE — Progress Notes (Signed)
Summary: Pt misplaced [employer]. Pls call in to [facility]  Phone Note Call from Patient Call back at [facility] Phone [phone]   Caller: Patient Summary of Call: Pt has misplaced written script for Ambien. Mail order had sent script back because it was wriiten for too many. [facility] will only fill med for #30. Pls call in to local [facility] [phone].  Initial call taken by: Lucy Antigua,  September 17, 2009 11:22 AM  Follow-up for Phone Call        Last filled 08/28/2009 #90 with 1 refill written Rx given to pt at [facility]. Sid Falcon LPN  September 17, 2009 1:24 PM Sounds like her mail order wouldn't fill the #90 rx.  Ok to refill for #30 locally with 5 refills. Follow-up by: Evelena Peat MD,  September 17, 2009 5:42 PM  Additional Follow-up for Phone Call Additional follow up Details #1::        Rx called in, pt informed on home VM Additional Follow-up by: Sid Falcon LPN,  September 18, 2009 10:27 AM    Prescriptions: AMBIEN 10 MG TABS (ZOLPIDEM TARTRATE) at bedtime  #30 x 5   Entered by:   Sid Falcon LPN   Authorized by:   Evelena Peat MD   Signed by:   Sid Falcon LPN on 30/86/5784   Method used:   Telephoned to ...       Hospital doctor (retail)       [address]       [place], [place]  [address]       Ph: [phone] or [phone]       Fax: [phone]   RxID:   [id number]

## 2010-03-26 NOTE — Assessment & Plan Note (Signed)
Summary: 3 wk rov/njr   Vital Signs:  Patient profile:   55 year old female Menstrual status:  hysterectomy Weight:      259 pounds Temp:     97.8 degrees F oral BP sitting:   130 / 80  (left arm) Cuff size:   regular  Vitals Entered By: Sid Falcon LPN (September 17, 2008 8:51 AM) CC: Right lower leg wound improved   History of Present Illness: patient seen for followup of right leg wound which has been present for approximately 6 months and somewhat waxing and waning. This has never fully healed. She has been avoiding irritants such as Betadine or hydrogen peroxide. She's been using topical antibiotic without improvement. She has not noted any drainage. No fevers or chills. No reported history of trauma.  Allergies: 1)  ! Penicillin V Potassium (Penicillin V Potassium) 2)  ! Codeine Sulfate (Codeine Sulfate)  Review of Systems      See HPI  Physical Exam  General:  Well-developed,well-nourished,in no acute distress; alert,appropriate and cooperative throughout examination Extremities:  right leg reveals circumferential area about 1 x 1 cm which is minimally erythematous and she has on left side of this small eschar. No elevated borders. No evidence for any pigmentation. No scaling. No pustules or any drainage.   Impression & Recommendations:  Problem # 1:  WOUND, LEG (ICD-891.0)  we have previously discussed a recommendation for punch biopsy given the fact this is non-resolving over several months. Patient agrees after going over full risk and benefits.  after obtaining consent the skin was prepped with Betadine and anesthetized 1% Xylocaine with epinephrine. Punch biopsy was #6 mm punch without difficulty.  Minimal bleeding. Closed with 5-0 Ethilon 2 sutures. Antibiotic dressing applied. Return 13 days for suture removal and recheck. Wound care instruction given.  Orders: Biopsy (Punch) Skin, Single Lesion (11100)  Complete Medication List: 1)  Enbrel 50 Mg/ml Soln  (Etanercept) .... Once weekly 2)  Methotrexate 2.5 Mg Tabs (Methotrexate sodium) .Marland Kitchen.. 10 tabs once a week 3)  Estrace 1 Mg Tabs (Estradiol) .... Once daily 4)  Folic Acid 1 Mg Tabs (Folic acid) .... 2 tabs daily 5)  Lisinopril 20 Mg Tabs (Lisinopril) .... Once daily 6)  Prednisone 5 Mg Tabs (Prednisone) .... Once daily 7)  Prilosec 20 Mg Cpdr (Omeprazole) .... 2 daily 8)  Aspirin 81 Mg Tabs (Aspirin) .... Once daily 9)  Tylenol Arthritis Pain 650 Mg Cr-tabs (Acetaminophen) .... As needed 10)  Zocor 20 Mg Tabs (Simvastatin) .... Once daily 11)  Ambien 10 Mg Tabs (Zolpidem tartrate) .... At bedtime  Patient Instructions: 1)  Please schedule followup visit and the 13-14 days. Prescriptions: AMBIEN 10 MG TABS (ZOLPIDEM TARTRATE) at bedtime  #30 x 5   Entered and Authorized by:   Evelena Peat MD   Signed by:   Evelena Peat MD on 09/17/2008   Method used:   Print then Give to Patient   RxID:   [id number]

## 2010-03-26 NOTE — Progress Notes (Signed)
  Phone Note From Other Clinic   Caller: [facility] Details for Reason: Pt.Information Initial call taken by: Mcneil Sober over to [phone] [facility]  April 02, 2009 12:55 PM -

## 2010-03-26 NOTE — Assessment & Plan Note (Signed)
Summary: ONE MTH ROV // RS   Vital Signs:  Patient profile:   55 year old female Menstrual status:  hysterectomy Weight:      278 pounds Temp:     98.0 degrees F oral BP sitting:   110 / 78  (left arm) Cuff size:   large  Vitals Entered By: Felicia Falcon LPN (January 12, 2010 11:09 AM)  History of Present Illness: Patient seen for the following  Mostly asymptomatic rash above her right eyebrow unchanged with topical steroid. Slightly scaly along lateral border.  Right TMJ pain progressive over 6 months. No relief with anti-inflammatories. She notices a clicking sensation when she opens her jaw. Has not seen oral surgeon for this. No injury. Pain is moderate to severe at times.  No alleviating factors.  Exacerbated with opening mandible.  Pain is progressive.  Followup depression. Greatly improved on sertraline. No side effects. Improved mood and increased motivation. Sleeping well. No problems with libido.  Also feels less anxious.  Allergies: 1)  Penicillin V Potassium (Penicillin V Potassium) 2)  Codeine Sulfate (Codeine Sulfate)  Past History:  Past Medical History: Last updated: 12/30/2008 Arthritis GERD Hyperlipidemia Hypertension Blood transfusion Rheumatoid Arthritis  Social History: Last updated: 08/27/2008 Married Never Smoked Alcohol use-no Regular exercise-yes PMH reviewed for relevance, SH/Risk Factors reviewed for relevance  Review of Systems  The patient denies anorexia, fever, weight loss, chest pain, dyspnea on exertion, peripheral edema, prolonged cough, headaches, hemoptysis, abdominal pain, melena, and hematochezia.    Physical Exam  General:  Well-developed,well-nourished,in no acute distress; alert,appropriate and cooperative throughout examination Head:  Normocephalic and atraumatic without obvious abnormalities. No apparent alopecia or balding. Ears:  External ear exam shows no significant lesions or deformities.  Otoscopic examination  reveals clear canals, tympanic membranes are intact bilaterally without bulging, retraction, inflammation or discharge. Hearing is grossly normal bilaterally. Mouth:  patient has tenderness right TMJ joint with significant click with opening of the jaw Neck:  No deformities, masses, or tenderness noted. Lungs:  Normal respiratory effort, chest expands symmetrically. Lungs are clear to auscultation, no crackles or wheezes. Heart:  Normal rate and regular rhythm. S1 and S2 normal without gallop, murmur, click, rub or other extra sounds. Extremities:  No clubbing, cyanosis, edema, or deformity noted with normal full range of motion of all joints.   Neurologic:  alert & oriented X3 and cranial nerves II-XII intact.   Skin:  pt has blanching mild erythema above R brow > left with lateral border some mild scaling.  No ulcerative changes.  Some superficial telangiectasias.  No nodules. Cervical Nodes:  No lymphadenopathy noted Psych:  normally interactive, good eye contact, not anxious appearing, and not depressed appearing.     Impression & Recommendations:  Problem # 1:  SKIN RASH (ICD-782.1) appears to have some telangiectasias and ? early actinic keratoses lateral border.  Discussed rx with liquid N and she will consider in  near future. Her updated medication list for this problem includes:    Triamcinolone Acetonide 0.5 % Crea (Triamcinolone acetonide) .Marland Kitchen... Apply to affected rash two times a day as needed  Problem # 2:  DEPRESSION (ICD-311) Assessment: Improved cont Sertraline for minimal 6-9 months. Her updated medication list for this problem includes:    Sertraline Hcl 50 Mg Tabs (Sertraline hcl) ..... One by mouth once daily  Problem # 3:  TMJ PAIN (ICD-524.62) rec refer to oral surgeon and she will check on insurance coverage.  Complete Medication List: 1)  Enbrel 50 Mg/ml  Soln (Etanercept) .... Once weekly 2)  Methotrexate 2.5 Mg Tabs (Methotrexate sodium) .... Weekly injection  per beekman 3)  Estrace 1 Mg Tabs (Estradiol) .... Once daily 4)  Folic Acid 1 Mg Tabs (Folic acid) .... 2 tabs daily 5)  Lisinopril 20 Mg Tabs (Lisinopril) .... Once daily 6)  Prednisone 5 Mg Tabs (Prednisone) .... Once daily 7)  Prilosec 20 Mg Cpdr (Omeprazole) .... 2 daily 8)  Aspirin 81 Mg Tabs (Aspirin) .... Once daily 9)  Tylenol Arthritis Pain 650 Mg Cr-tabs (Acetaminophen) .... As needed 10)  Zocor 20 Mg Tabs (Simvastatin) .... Once daily 11)  Ambien 10 Mg Tabs (Zolpidem tartrate) .... At bedtime 12)  Hydrochlorothiazide 25 Mg Tabs (Hydrochlorothiazide) .... One half tablet daily 13)  Fish Oil 500 Mg Caps (Omega-3 fatty acids) .... Two times a day 14)  Oxycodone-acetaminophen 5-325 Mg Tabs (Oxycodone-acetaminophen) .... One to two by mouth q 4-6 hours as needed pain 15)  Triamcinolone Acetonide 0.5 % Crea (Triamcinolone acetonide) .... Apply to affected rash two times a day as needed 16)  Sertraline Hcl 50 Mg Tabs (Sertraline hcl) .... One by mouth once daily  Patient Instructions: 1)  check on insurance coverage for oral surgeon regarding your right TMJ pain and I will be happy to arrange.   Orders Added: 1)  Est. Patient Level IV [[address]]

## 2010-03-27 NOTE — Letter (Signed)
Summary: Records from [facility] 2009 - 2010  Records from [facility] 2009 - 2010   Imported By: Maryln Gottron 12/23/2008 15:36:55  _____________________________________________________________________  External Attachment:    Type:   Image     Comment:   External Document

## 2010-03-31 ENCOUNTER — Ambulatory Visit (INDEPENDENT_AMBULATORY_CARE_PROVIDER_SITE_OTHER): Payer: Medicare Other | Admitting: Family Medicine

## 2010-03-31 ENCOUNTER — Encounter: Payer: Self-pay | Admitting: Family Medicine

## 2010-03-31 VITALS — BP 120/80 | Temp 97.8°F | Ht 64.0 in | Wt 265.0 lb

## 2010-03-31 DIAGNOSIS — J019 Acute sinusitis, unspecified: Secondary | ICD-10-CM

## 2010-03-31 MED ORDER — AZITHROMYCIN 250 MG PO TABS: ORAL_TABLET | ORAL | Status: AC

## 2010-03-31 NOTE — Progress Notes (Signed)
  Subjective:    Patient ID: [name], female    DOB: 10-28-1955, 55 y.o.   MRN: [id number]  HPI  Patient seen with 3 week history of upper respiratory infection symptoms. She takes immunosuppressive drugs including methotrexate and Enbrel per her oncologist. She rates three-week history of cough, intermittent headaches, nasal congestion with recent production of greenish nasal mucus. Some productive cough as well. Initial sore throat, now improved. Past few days chills also noted in temperature up to 102 yesterday. Denies any nausea, vomiting, or diarrhea. Mild relief with Tylenol. Nausea with hydrocodone cough syrup  Review of Systems See history of present illness    Objective:   Physical Exam    patient is alert in no distress Oropharynx is clear TMs normal Nasal mucosa erythematous with some yellow mucus left naris Neck is supple with no adenopathy Chest clear to auscultation Heart regular rhythm and rate with no murmur Skin no rash    Assessment & Plan:  Acute sinusitis in patient immunocompromised by medications Start Zithromax. Saline nasal irrigation

## 2010-03-31 NOTE — Patient Instructions (Signed)

## 2010-04-01 NOTE — Progress Notes (Signed)
  Phone Note Call from Patient   Caller: Patient Call For: Evelena Peat MD Summary of Call: Pt has had an URI and is coughing all night.  Wants cough meds, but is allergic to Penicillin and Codeine. [facility] [phone] Initial call taken by: [facility] CMA AAMA,  March 24, 2010 9:20 AM  Follow-up for Phone Call        can she take hydrocodone?If so, hycodan cough syrup one tsp by mouth q 4-6 hours as needed 120 ml with no refill. Follow-up by: Evelena Peat MD,  March 24, 2010 10:12 AM    New/Updated Medications: HYDROMET 5-1.5 MG/5ML SYRP (HYDROCODONE-HOMATROPINE) as directed Prescriptions: HYDROMET 5-1.5 MG/5ML SYRP (HYDROCODONE-HOMATROPINE) as directed  #120 ml x 0   Entered by:   Lynann Beaver CMA AAMA   Authorized by:   Evelena Peat MD   Signed by:   Lynann Beaver CMA AAMA on 03/24/2010   Method used:   Telephoned to ...       Hospital doctor (retail)       [address]       [place], [place]  [address]       Ph: [phone] or [phone]       Fax: [phone]   RxID:   [id number]  Pt can take Hydrocodone and was notified.

## 2010-04-06 ENCOUNTER — Telehealth: Payer: Self-pay | Admitting: *Deleted

## 2010-04-06 NOTE — Telephone Encounter (Signed)
[facility] Triage Call Report Triage Record Num: [id number] Operator: Jeraldine Loots Patient Name: [name] Call Date & Time: 04/04/2010 8:24:42AM Patient Phone: [phone] PCP: Evelena Peat Patient Gender: Female PCP Fax : [phone] Patient DOB: Jul 04, 1955 [facility] Name: [facility] Reason for Call: Pt calling, was seen on Tuesday and dx with sinus infection. Completed the zpac today. Has blisters in her throat and ear pain and h/a. . Has "a terrible cough" with green sputum. No fever. LMP hysterectomy in 1995. Needs to be seen, going to UC or ED this am for evaluation. Protocol(s) Used: Upper Respiratory Infection (URI) Recommended Outcome per Protocol: See Provider within 24 hours Reason for Outcome: Productive cough with colored sputum (other than clear or white sputum) Care Advice: Increase fluids to 8-12 eight oz (1.6 to 2.4 liters) glasses per day, half of them to be water. Soups, popsicles, fruit juices, non-caffeinated sodas (unless restricting sodium intake), jello, broths, decaf teas, etc. are all okay. Warm fluids can be soothing. ~ ~ SYMPTOM / CONDITION MANAGEMENT Coughing up mucus or phlegm helps to get rid of an infection. A productive cough should not be stopped. A cough medicine with guaifenesin (Robitussin, Mucinex) can help loosen the mucus. Cough medicine with dextromethorphan (DM) should be avoided. Drinking lots of fluids can help loosen the mucus too, especially warm fluids. ~ Call provider if has a fever over 101.5 F (38.6 C) that has not responded to home care measures, having shaking chills or any fever in someone immunocompromised/frail elderly. ~ 04/04/2010 8:31:46AM Page 1 of 1 [id number]

## 2010-04-15 ENCOUNTER — Encounter: Payer: Self-pay | Admitting: Family Medicine

## 2010-04-15 ENCOUNTER — Ambulatory Visit (INDEPENDENT_AMBULATORY_CARE_PROVIDER_SITE_OTHER): Payer: Medicare Other | Admitting: Family Medicine

## 2010-04-15 VITALS — BP 120/80 | Temp 97.9°F | Wt 267.0 lb

## 2010-04-15 DIAGNOSIS — J019 Acute sinusitis, unspecified: Secondary | ICD-10-CM

## 2010-04-15 MED ORDER — LEVOFLOXACIN 500 MG PO TABS: 500.0000 mg | ORAL_TABLET | Freq: Every day | ORAL | Status: AC

## 2010-04-15 NOTE — Progress Notes (Signed)
  Subjective:    Patient ID: [name], female    DOB: [birth year], 55 y.o.   MRN: [id number]  HPI  Patient seen with some persistent sinus problems. Refer to prior note. She is immunocompromised secondary to treatment of her arthritis. We started on Zithromax and she did not see much improvement. She continues to have frontal and maxillary sinus pressure pain bilaterally. No fever. Some continued yellow to green nasal discharge.  Has some mild headaches. Denies any nausea, vomiting, or diarrhea. Only rare dry cough.  Review of Systems  as per history of present illness    Objective:   Physical Exam  patient is alert nontoxic in appearance Eardrums are normal Sinuses tender frontal and maxillary bilaterally Nasal exam unremarkable Oropharynx clear Neck supple no adenopathy Chest good auscultation Heart regular rhythm and rate with no murmur        Assessment & Plan:   persistent sinusitis symptoms in a patient immunocompromised. Start Levaquin 500 milligrams daily for 10 days. Consider limited CT of sinuses if no better in 2 weeks

## 2010-04-23 ENCOUNTER — Other Ambulatory Visit: Payer: Self-pay | Admitting: Family Medicine

## 2010-04-23 DIAGNOSIS — F329 Major depressive disorder, single episode, unspecified: Secondary | ICD-10-CM

## 2010-04-23 DIAGNOSIS — F32A Depression, unspecified: Secondary | ICD-10-CM

## 2010-05-14 ENCOUNTER — Ambulatory Visit (INDEPENDENT_AMBULATORY_CARE_PROVIDER_SITE_OTHER): Payer: Medicare Other | Admitting: Family Medicine

## 2010-05-14 ENCOUNTER — Encounter: Payer: Self-pay | Admitting: Family Medicine

## 2010-05-14 VITALS — BP 120/70 | Temp 97.7°F | Wt 265.0 lb

## 2010-05-14 DIAGNOSIS — J019 Acute sinusitis, unspecified: Secondary | ICD-10-CM

## 2010-05-14 MED ORDER — FLUTICASONE PROPIONATE 50 MCG/ACT NA SUSP: 2.0000 | Freq: Every day | NASAL | Status: DC

## 2010-05-14 MED ORDER — LEVOFLOXACIN 500 MG PO TABS: 500.0000 mg | ORAL_TABLET | Freq: Every day | ORAL | Status: AC

## 2010-05-14 NOTE — Patient Instructions (Signed)
Touch base in 1-2 weeks if no better.

## 2010-05-14 NOTE — Progress Notes (Signed)
  Subjective:    Patient ID: [name], female    DOB: Jun 26, 1955, 55 y.o.   MRN: [id number]  HPI  patient seen with recurrent symptoms of headache, frontal sinus pressure, productive cough , sore throat , and nasal congestion over the past week. Refer to prior note. One month ago treated with Levaquin for presumed sinusitis. Did improve some after that treatment. She's not sure she was ever fully 100% better. Developed fever of 100.5 earlier today which is first fever she's had with this. She does take methotrexate from a rheumatologist as well as low-dose prednisone. Nonsmoker.   Review of Systems  Constitutional: Positive for fever and fatigue. Negative for chills and appetite change.  HENT: Positive for congestion, postnasal drip and sinus pressure.   Respiratory: Positive for cough. Negative for shortness of breath and wheezing.   Cardiovascular: Negative for chest pain and leg swelling.       Objective:   Physical Exam  patient alert and in no distress .Nasal exam reveals minimal clear mucus bilaterally. Somewhat erythematous mucosa. no puslike drainage. Eardrums normal Neck supple with no adenopathy Chest clear to auscultation Heart regular rhythm and rate      Assessment & Plan:   Rhinitis.  Differential includes recurrent viral illness , component of chronic sinusitis , or recurrent acute sinusitis  start fluticasone nasal 2 sprays per nostril once daily and refill Levaquin 500 milligrams once daily for 10 days. Touch base one to 2 weeks if no better

## 2010-05-26 ENCOUNTER — Telehealth: Payer: Self-pay | Admitting: *Deleted

## 2010-05-26 NOTE — Telephone Encounter (Signed)
Still having exactly same symptoms of sinus infections after 3 rounds of the antibiotics.

## 2010-05-26 NOTE — Telephone Encounter (Signed)
Left message with Dr. Lucie Leather plan for CT of sinuses.

## 2010-05-26 NOTE — Telephone Encounter (Signed)
We need to set up limited CT of sinuses as I discussed with her.  Will order.

## 2010-05-31 LAB — PROTIME-INR
INR: 1 (ref 0.00–1.49)
Prothrombin Time: 13.2 seconds (ref 11.6–15.2)

## 2010-05-31 LAB — URINALYSIS, ROUTINE W REFLEX MICROSCOPIC
Bilirubin Urine: NEGATIVE
Glucose, UA: NEGATIVE mg/dL
Ketones, ur: NEGATIVE mg/dL
Leukocytes, UA: NEGATIVE
Nitrite: NEGATIVE
Protein, ur: NEGATIVE mg/dL
Specific Gravity, Urine: 1.023 (ref 1.005–1.030)
Urobilinogen, UA: 0.2 mg/dL (ref 0.0–1.0)
pH: 5 (ref 5.0–8.0)

## 2010-05-31 LAB — BASIC METABOLIC PANEL
BUN: 18 mg/dL (ref 6–23)
CO2: 27 mEq/L (ref 19–32)
Calcium: 9 mg/dL (ref 8.4–10.5)
Chloride: 105 mEq/L (ref 96–112)
Creatinine, Ser: 0.64 mg/dL (ref 0.4–1.2)
GFR calc Af Amer: >60 mL/min (ref >60)
GFR calc non Af Amer: >60 mL/min (ref >60)
Glucose, Bld: 108 mg/dL — ABNORMAL HIGH (ref 70–99)
Potassium: 4.1 mEq/L (ref 3.5–5.1)
Sodium: 139 mEq/L (ref 135–145)

## 2010-05-31 LAB — CBC
HCT: 39.2 % (ref 36.0–46.0)
Hemoglobin: 13 g/dL (ref 12.0–15.0)
MCHC: 33.3 g/dL (ref 30.0–36.0)
MCV: 93.3 fL (ref 78.0–100.0)
Platelets: 284 10*3/uL (ref 150–400)
RBC: 4.2 MIL/uL (ref 3.87–5.11)
RDW: 14.9 % (ref 11.5–15.5)
WBC: 8.3 10*3/uL (ref 4.0–10.5)

## 2010-05-31 LAB — URINE MICROSCOPIC-ADD ON

## 2010-05-31 LAB — APTT: aPTT: 24 seconds (ref 24–37)

## 2010-06-01 ENCOUNTER — Ambulatory Visit (INDEPENDENT_AMBULATORY_CARE_PROVIDER_SITE_OTHER)
Admission: RE | Admit: 2010-06-01 | Discharge: 2010-06-01 | Disposition: A | Discharge status: Home / Self Care | Payer: Medicare Other | Source: Ambulatory Visit | Attending: Family Medicine | Admitting: Family Medicine

## 2010-06-01 ENCOUNTER — Other Ambulatory Visit: Payer: Self-pay | Admitting: Family Medicine

## 2010-06-01 DIAGNOSIS — J329 Chronic sinusitis, unspecified: Secondary | ICD-10-CM

## 2010-06-01 NOTE — Progress Notes (Signed)
Quick Note:  Pt informed ______ 

## 2010-06-02 ENCOUNTER — Telehealth: Payer: Self-pay | Admitting: *Deleted

## 2010-06-02 DIAGNOSIS — F32A Depression, unspecified: Secondary | ICD-10-CM

## 2010-06-02 DIAGNOSIS — J019 Acute sinusitis, unspecified: Secondary | ICD-10-CM

## 2010-06-02 DIAGNOSIS — F329 Major depressive disorder, single episode, unspecified: Secondary | ICD-10-CM

## 2010-06-02 MED ORDER — FLUTICASONE PROPIONATE 50 MCG/ACT NA SUSP: 2.0000 | Freq: Every day | NASAL | Status: DC

## 2010-06-02 MED ORDER — OMEPRAZOLE 20 MG PO CPDR: 20.0000 mg | DELAYED_RELEASE_CAPSULE | Freq: Two times a day (BID) | ORAL | Status: DC

## 2010-06-02 MED ORDER — ESTRADIOL 1 MG PO TABS: 1.0000 mg | ORAL_TABLET | Freq: Every day | ORAL | Status: DC

## 2010-06-02 MED ORDER — SIMVASTATIN 20 MG PO TABS: 20.0000 mg | ORAL_TABLET | Freq: Every day | ORAL | Status: DC

## 2010-06-02 MED ORDER — ZOLPIDEM TARTRATE 10 MG PO TABS: 10.0000 mg | ORAL_TABLET | Freq: Every evening | ORAL | Status: DC | PRN

## 2010-06-02 MED ORDER — HYDROCHLOROTHIAZIDE 25 MG PO TABS: 25.0000 mg | ORAL_TABLET | Freq: Every day | ORAL | Status: DC

## 2010-06-02 MED ORDER — LISINOPRIL 20 MG PO TABS: 20.0000 mg | ORAL_TABLET | Freq: Every day | ORAL | Status: DC

## 2010-06-02 NOTE — Telephone Encounter (Signed)
Pt mailorder [facility] requesting refill Zolpidem Tartrate 10 mg.

## 2010-06-02 NOTE — Telephone Encounter (Signed)
Rx faxed, confirmation received.

## 2010-06-02 NOTE — Telephone Encounter (Signed)
OK to refill for 6 months 

## 2010-06-04 LAB — TYPE AND SCREEN
ABO/RH(D): B POS
Antibody Screen: NEGATIVE

## 2010-06-04 LAB — BASIC METABOLIC PANEL
BUN: 11 mg/dL (ref 6–23)
BUN: 8 mg/dL (ref 6–23)
CO2: 26 mEq/L (ref 19–32)
CO2: 28 mEq/L (ref 19–32)
Calcium: 7.8 mg/dL — ABNORMAL LOW (ref 8.4–10.5)
Calcium: 8 mg/dL — ABNORMAL LOW (ref 8.4–10.5)
Chloride: 104 mEq/L (ref 96–112)
Chloride: 105 mEq/L (ref 96–112)
Creatinine, Ser: 0.48 mg/dL (ref 0.4–1.2)
Creatinine, Ser: 0.52 mg/dL (ref 0.4–1.2)
GFR calc Af Amer: >60 mL/min (ref >60)
GFR calc Af Amer: >60 mL/min (ref >60)
GFR calc non Af Amer: >60 mL/min (ref >60)
GFR calc non Af Amer: >60 mL/min (ref >60)
Glucose, Bld: 114 mg/dL — ABNORMAL HIGH (ref 70–99)
Glucose, Bld: 147 mg/dL — ABNORMAL HIGH (ref 70–99)
Potassium: 3 mEq/L — ABNORMAL LOW (ref 3.5–5.1)
Potassium: 3.5 mEq/L (ref 3.5–5.1)
Sodium: 135 mEq/L (ref 135–145)
Sodium: 136 mEq/L (ref 135–145)

## 2010-06-04 LAB — CBC
HCT: 31.6 % — ABNORMAL LOW (ref 36.0–46.0)
HCT: 33.1 % — ABNORMAL LOW (ref 36.0–46.0)
Hemoglobin: 10.5 g/dL — ABNORMAL LOW (ref 12.0–15.0)
Hemoglobin: 11.2 g/dL — ABNORMAL LOW (ref 12.0–15.0)
MCHC: 33.2 g/dL (ref 30.0–36.0)
MCHC: 33.9 g/dL (ref 30.0–36.0)
MCV: 95.3 fL (ref 78.0–100.0)
MCV: 96.1 fL (ref 78.0–100.0)
Platelets: 261 10*3/uL (ref 150–400)
Platelets: 268 10*3/uL (ref 150–400)
RBC: 3.29 MIL/uL — ABNORMAL LOW (ref 3.87–5.11)
RBC: 3.48 MIL/uL — ABNORMAL LOW (ref 3.87–5.11)
RDW: 13 % (ref 11.5–15.5)
RDW: 14 % (ref 11.5–15.5)
WBC: 10.6 10*3/uL — ABNORMAL HIGH (ref 4.0–10.5)
WBC: 9.3 10*3/uL (ref 4.0–10.5)

## 2010-06-04 LAB — ABO/RH: ABO/RH(D): B POS

## 2010-06-09 LAB — URINALYSIS, ROUTINE W REFLEX MICROSCOPIC
Bilirubin Urine: NEGATIVE
Glucose, UA: NEGATIVE mg/dL
Ketones, ur: NEGATIVE mg/dL
Leukocytes, UA: NEGATIVE
Nitrite: NEGATIVE
Protein, ur: NEGATIVE mg/dL
Specific Gravity, Urine: 1.023 (ref 1.005–1.030)
Urobilinogen, UA: 0.2 mg/dL (ref 0.0–1.0)
pH: 5.5 (ref 5.0–8.0)

## 2010-06-09 LAB — DIFFERENTIAL
Basophils Absolute: 0 10*3/uL (ref 0.0–0.1)
Basophils Relative: 1 % (ref 0–1)
Eosinophils Absolute: 0.1 10*3/uL (ref 0.0–0.7)
Eosinophils Relative: 1 % (ref 0–5)
Lymphocytes Relative: 35 % (ref 12–46)
Lymphs Abs: 2.7 10*3/uL (ref 0.7–4.0)
Monocytes Absolute: 0.9 10*3/uL (ref 0.1–1.0)
Monocytes Relative: 11 % (ref 3–12)
Neutro Abs: 4 10*3/uL (ref 1.7–7.7)
Neutrophils Relative %: 52 % (ref 43–77)

## 2010-06-09 LAB — BASIC METABOLIC PANEL
BUN: 18 mg/dL (ref 6–23)
CO2: 30 mEq/L (ref 19–32)
Calcium: 9 mg/dL (ref 8.4–10.5)
Chloride: 105 mEq/L (ref 96–112)
Creatinine, Ser: 0.53 mg/dL (ref 0.4–1.2)
GFR calc Af Amer: >60 mL/min (ref >60)
GFR calc non Af Amer: >60 mL/min (ref >60)
Glucose, Bld: 103 mg/dL — ABNORMAL HIGH (ref 70–99)
Potassium: 4.4 mEq/L (ref 3.5–5.1)
Sodium: 141 mEq/L (ref 135–145)

## 2010-06-09 LAB — CBC
HCT: 38.4 % (ref 36.0–46.0)
Hemoglobin: 12.7 g/dL (ref 12.0–15.0)
MCHC: 33.2 g/dL (ref 30.0–36.0)
MCV: 95.5 fL (ref 78.0–100.0)
Platelets: 299 10*3/uL (ref 150–400)
RBC: 4.02 MIL/uL (ref 3.87–5.11)
RDW: 14 % (ref 11.5–15.5)
WBC: 7.7 10*3/uL (ref 4.0–10.5)

## 2010-06-09 LAB — URINE MICROSCOPIC-ADD ON

## 2010-06-09 LAB — PROTIME-INR
INR: 1 (ref 0.00–1.49)
Prothrombin Time: 12.8 seconds (ref 11.6–15.2)

## 2010-06-09 LAB — APTT: aPTT: 28 seconds (ref 24–37)

## 2010-07-07 NOTE — H&P (Signed)
NAMEBRITTANI, [name]               ACCOUNT NO.:  [id number]   MEDICAL RECORD NO.:  [id number]          PATIENT TYPE:  INP   LOCATION:  NA                           FACILITY:  [facility]   PHYSICIAN:  Madlyn Frankel. Charlann Boxer, M.D.  DATE OF BIRTH:  [birth year]   DATE OF ADMISSION:  04/22/2008  DATE OF DISCHARGE:                              HISTORY & PHYSICAL   PROCEDURE:  Left total knee replacement.   CHIEF COMPLAINT:  Left knee pain.   HISTORY OF PRESENT ILLNESS:  A 55 year old female with a history of left  knee pain secondary to osteoarthritis in the setting of rheumatoid  arthritis.  It has been refractory to all conservative treatment.  She  has been presurgically assessed by her primary care physician, Evelena Peat, M.D.   SPECIALIST:  Areatha Keas, M.D.   PAST MEDICAL HISTORY:  Significant for:  1. Osteoarthritis,  2. Rheumatoid arthritis.  3. Hypertension.  4. Dyslipidemia.  5. Reflux disease.  6. Irritable bowel syndrome.  7. Fibromyalgia.  8. Status scarlet fever.   PAST SURGERIES:  1. Knee scope in 2009.  2. Hysterectomy in 1995.  3. Two C-sections.  4. Foot surgery in 2008.  5. Cholecystectomy in 1997.   FAMILY HISTORY:  Kidney disease, hypertension, cancer, coronary artery  disease.   SOCIAL HISTORY:  Married, nonsmoker.  Primary caregiver will be husband  in the home postoperatively.   DRUG ALLERGIES:  PENICILLIN AND CODEINE.  STATES SHE HAS ALLERGIC  REACTIONS AND GETS EXTREMELY NAUSEOUS WITH HYDROCODONE AND CODEINE  DERIVATIVES.   MEDICATIONS:  1. Enbrel 50 mg q. weekly.  2. Methotrexate 2.5 mg 10 tablets weekly.  3. Prednisone 5 mg daily.  4. Estrace 1 mg daily.  5. Lisinopril 20 mg one p.o. daily.  6. Prilosec 20 mg p.o. b.i.d.  7. Aspirin 81 mg p.o. daily.  8. Tylenol arthritis p.r.n.  9. Mepergan p.r.n. for pain.  10.Zocor 20 mg p.o. daily.  11.Celebrex 200 mg one p.o. b.i.d. x2 weeks after surgery.  She is      also utilizing it  perioperatively.   REVIEW OF SYSTEMS:  HEENT:  She wears partial dentures.  CARDIOVASCULAR:  Last EKG was done in the primary care physician's office on February 17.  MUSCULOSKELETAL:  She has joint pain, joint swelling, morning stiffness  due to her rheumatoid.  Otherwise, see HPI.   PHYSICAL EXAMINATION:  This is 5', 4, 265 pounds female. Pulse 72,  respirations 16, blood pressure 134/80.  GENERAL:  Awake, alert and oriented.  HEENT:  Normocephalic.  NECK:  Supple.  No carotid bruits.  CHEST:  Lungs clear to auscultation bilaterally.  BREASTS:  Deferred.  HEART:  S1 and S2 distinct.  ABDOMEN:  Soft, nontender, bowel sounds present.  PELVIS:  Stable.  EXTREMITIES:  Left knee has medial and lateral sided tenderness.  Comes  out to full extension.  Stable all four quadrants.  SKIN:  No cellulitis.  NEUROLOGIC:  Intact distal sensibilities.   LABORATORY DATA:  Labs:  Pending presurgical testing.  EKG done in her  primary care physician's office.  Chest x-ray pending.   IMPRESSION:  Left knee osteoarthritis.   PLAN OF ACTION:  1. Left total knee replacement by Madlyn Frankel. Charlann Boxer, M.D. at [facility] on April 22, 2008.  The risks and complications were      discussed.  2. Postoperative medications were provided today including aspirin for      DVT prophylaxis.     ______________________________  Yetta Glassman [address], [place]      Madlyn Frankel. Charlann Boxer, M.D.  Electronically Signed    BLM/MEDQ  D:  04/11/2008  T:  04/11/2008  Job:  [id number]   cc:   Areatha Keas, M.D.  Fax: [phone]   Evelena Peat, M.D.

## 2010-07-07 NOTE — Op Note (Signed)
[name], [name]               ACCOUNT NO.:  [id number]   MEDICAL RECORD NO.:  [id number]          PATIENT TYPE:  AMB   LOCATION:  DAY                          FACILITY:  [facility]   PHYSICIAN:  Madlyn Frankel. Charlann Boxer, M.D.  DATE OF BIRTH:  [birth year]   DATE OF PROCEDURE:  08/29/2008  DATE OF DISCHARGE:                               OPERATIVE REPORT   PREOPERATIVE DIAGNOSIS:  Painful and stiff left total knee  postoperatively with some arthrofibrosis limited with her physical  therapy.   POSTOPERATIVE DIAGNOSIS/FINDINGS:  The patient was noted to have a  preoperative range of motion between 5-10 flexion contracture and 90  degrees of flexion.   PROCEDURE:  1. Left total knee examination under anesthesia.  2. Left knee manipulation under anesthesia.  Postoperative findings      after manipulation were a range of motion of 5-130 degrees pretty      easily with a very easily audible lysis of adhesions.   SURGEON:  Madlyn Frankel. Charlann Boxer, M.D.   ASSISTANT:  None.   ANESTHESIA:  Spinal plus MAC anesthesia.   COMPLICATIONS:  None.   SPECIMENS:  None.   INDICATIONS:  [name] is a 55 year old female with history of index  total knee replacement in March of 2010.  She had progressed fairly well  but began having problems with physical therapy due to pain and limited  motion.  She was unable to get through these barriers now 3 months out.  I felt that it was very important for [place] to consider manipulation at  this point and not wait any longer.  The risks and benefits were  discussed.  Consent was obtained for the benefit of the increased motion  and pain relief.   PROCEDURE IN DETAIL:  The patient was brought to the operative theater.  Once adequate anesthesia, preoperative antibiotics were held not  utilized due to the fact there was no open incision.  Once adequate  anesthesia established a time-out was performed identifying the patient  and the planned procedure and extremity.   An  examination under anesthesia was carried out identifying the  preoperative range of motion.  The hip was then flexed and pressure  applied on it along the proximal tibia with a very easily and audible  lysis of adhesions with flexion all the way back to her thigh limiting  her range of motion and at this point was limited only by the habitus of  her posterior thigh and calf.  I did try to apply a little bit of  pressure with extension and felt a little bit of tearing of the  posterior capsular tissues but there were no complications here.   Following this manipulation and reexamination indicated a range of  motion of 5-120-130 degrees limited only by her body habitus.  Given  this was then awakened from anesthesia and brought to the recovery room  in stable condition, tolerating the procedure well with plan for postop  stay with physical therapy.      [name], M.D.  Electronically Signed     MDO/MEDQ  D:  08/29/2008  T:  08/29/2008  Job:  [id number]

## 2010-07-07 NOTE — Op Note (Signed)
NAMEJORDY, HEWINS               ACCOUNT NO.:  [id number]   MEDICAL RECORD NO.:  [id number]          PATIENT TYPE:  AMB   LOCATION:  DSC                          FACILITY:  [facility]   PHYSICIAN:  Leonides Grills, M.D.     DATE OF BIRTH:  [birth year]   DATE OF PROCEDURE:  03/07/2007  DATE OF DISCHARGE:                               OPERATIVE REPORT   PREOPERATIVE DIAGNOSES:  1. Left second, third and fourth metatarsalgia.  2. Left second, third and fourth hammertoes.  3. Left tight gastroc.   POSTOPERATIVE DIAGNOSES:  1. Left second, third and fourth metatarsalgia.  2. Left second, third and fourth hammertoes.  3. Left tight gastroc.   OPERATION:  1. Left second, third and fourth Weil metatarsal shortening      osteotomies.  2. Stress x-rays left foot.  3. Left gastroc slide.  4. Left second through fourth toes metatarsophalangeal joint dorsal      capsulotomies with collateral release.  5. Left second through fifth toes extensor digitorum brevis to      extensor digitorum longus tendon transfers.   ANESTHESIA:  General with block.   SURGEON:  Leonides Grills MD.   ASSISTANT:  Richardean Canal, P.A.   ESTIMATED BLOOD LOSS:  Minimal.   TOURNIQUET TIME:  Approximately an hour and 20 minutes.   COMPLICATIONS:  None.   DISPOSITION:  Stable to PR.   INDICATIONS:  This is a 55 year old female who had previous second and  third web space Morton's neuroma excisions and has had very painful left  forefoot to the point where it was interfering with her life.  She also  had a fifth metatarsal head resection on the same side as well.  She has  a Morton's foot with a longer second, third and fourth metatarsal shafts  compared to the great toe and obviously resection of the fifth  metatarsal head exacerbated this phenomenon as well.  She also had  exquisitely tight gastroc.  She was consented for the above procedure.  All risks which include infection, nerve or vessel injury, nonunion,  malunion, hardware irritation, hardware failure, cock-up toe deformity,  persistent pain, worsening pain as well as avascular necrosis of the  metatarsal head and possibly hardware irritation, hardware failure with  removal were all explained, questions were encouraged and answered.   OPERATION:  The patient brought to the operating room, placed in supine  position after adequate general endotracheal tube anesthesia was  administered with block as well as Ancef 1 gram IV piggyback.  The left  lower extremity is prepped and draped in sterile manner over proximally  placed thigh tourniquet.  We then made a longitudinal incision over the  medial aspect gastrocnemius muscle tendon junction.  Dissection was  carried down through skin.  Hemostasis was obtained.  Fascia was opened  in line with the incision.  Conjoined region was then developed between  the gastroc and soleus musculature.  Soft tissue was then elevated off  the posterior aspect of the gastrocnemius.  Sural nerve was identified  and protected posteriorly with a knee retractor.  Gastrocnemius was then  tendon was then released with a curved Mayo scissors.  This had  excellent release of tight gastroc and area was copiously irrigated with  normal saline.  Subcu was closed with 3-0 Vicryl.  Skin was closed with  4-0 Monocryl subcuticular stitch.  Steri-Strips were applied.  Limb was  then gravity exsanguinated.  Tourniquet was elevated to 290 mmHg.  A  longitudinal incision through the previous webspace incisions were then  made, second, third web spaces and the fourth web space was a new  incision that was made to approach the fourth metatarsal head.  I will  describe the second metatarsal procedure and the same exact procedures  were done through separate incision for the third and fourth as well.  We started the procedure by making a longitudinal incision through the  previous incision in the second web space.  Dissection was  carried down  through skin.  Hemostasis was obtained.  Extensor digitorum longus and  brevis tendons were identified.  The longus was tenotomized proximal  medial the brevis was tenotomized distal lateral and retracted out of  harm's way for later transfer.  An MTP joint dorsal capsulotomy with  collateral release was then performed with a 15 blade scalpel protecting  the neurovascular structures both medial and laterally.  We then plantar  flexed the toe and performed a Weil metatarsal shortening osteotomy with  stacked sagittal saw blades.  The head was then translated proximally  approximately 4 mm then affixed with a 12 mm long 1.5 mm fully threaded  cortical set screw using 1.1-mm drill hole respectively.  This had  excellent purchase and maintenance of the correction.  Redundant bone  dorsally was trimmed off with a rongeur.  Area and joint were copiously  irrigated with normal saline.  Bone wax was applied to exposed bony  surfaces.  We did the same exact procedure for the third and fourth toes  respectively through separate incisions.  At this point we then  reconstructed and transferred the EDB to EDL tendon transfer using 3-0  PDS stitch.  This had an outstanding transfer and repair.  This was done  again for the second, third and fourth toes respectively through  separate incisions.  We then performed stress x-rays, AP and lateral  planes, showed no gross motion across the osteotomy site, fixation  proper position and excellent alignment as well, adequate shortening and  symmetry established.  Tourniquet was deflated.  Hemostasis was  obtained.  Once again the area was copiously irrigated normal saline.  Skin was closed with 4-0 nylon stitch.  Toes pinked up nicely.  Steri-  Strips were applied, a sterile dressing was applied.  Cam walker boot  was applied with the ankle neutral dorsiflexion.  The patient was stable  to PR.      Leonides Grills, M.D.  Electronically  Signed     PB/MEDQ  D:  03/07/2007  T:  03/07/2007  Job:  [id number]

## 2010-07-07 NOTE — Op Note (Signed)
NAMEMYLINDA, BROOK               ACCOUNT NO.:  [id number]   MEDICAL RECORD NO.:  [id number]          PATIENT TYPE:  INP   LOCATION:  0007                         FACILITY:  [facility]   PHYSICIAN:  Madlyn Frankel. Charlann Boxer, M.D.  DATE OF BIRTH:  [birth year]   DATE OF PROCEDURE:  04/22/2008  DATE OF DISCHARGE:                               OPERATIVE REPORT   PREOPERATIVE DIAGNOSIS:  Left knee rheumatoid arthritis.   POSTOPERATIVE DIAGNOSIS:  Left knee rheumatoid arthritis.   PROCEDURE:  Left total knee replacement.   COMPONENTS USED:  DePuy rotating platform posterior stabilized knee  system with size 4 narrow femur, 3 tibia, 10-mm insert and a 35 patellar  button.   SURGEON:  Madlyn Frankel. Charlann Boxer, M.D.   ASSISTANT:  Dwyane Luo, PA-C   ANESTHESIA:  Duramorph/spinal.   SPECIMENS:  None.   FINDINGS:  None.   BLOOD LOSS:  Minimal.   TOURNIQUET TIME:  42 minutes at 300 mmHg.   COMPLICATIONS:  None.   INDICATIONS FOR PROCEDURE:  Ms. Fife is a 55 year old female who  presented for evaluation of left knee pain that has failed conservative  measures including previous arthroscopic debridement and cortisone  injections.  She at this point was eager to proceed with total knee  arthroplasty.  Her predominant findings were unicompartmental, she had  global knee pain, and despite her age and size, it was felt that the  best option for her was total knee replacement. I reviewed the risks and  benefits of this including potential need for revision surgery including  manipulation, infection, DVT, component failure as well as the duration  of the implant.  Consent was obtained for the benefit of pain relief.   PROCEDURE IN DETAIL:  The patient was brought to the operative theater.  Once adequate anesthesia, preoperative antibiotics, Ancef administered,  the patient was positioned supine.  Thigh tourniquet was placed.  The  left lower extremity was prescrubbed and prepped and draped in a sterile  fashion.  Time-out was performed, identifying the patient, planned  procedure and the extremity.  The leg was exsanguinated, tourniquet  elevated to 300 mmHg.  Midline incision was made followed by dissection  to extensor mechanism.  A median arthrotomy was created.  Following  initial debridement, attention was directed to the patella.  Precut  measurement was 24 mm.  I resected down to 14 mm and used a 35 patellar  button which covered the cut surface best.  Lug holes were drilled.  A  metal shim was placed on the patella to protect it from retractors and  saw blades.   Attention was now directed to the femur.  Femoral canal was opened with  a drill, irrigated to prevent fat emboli.  At 3 degrees of valgus,  intramedullary rod was placed and 10 mm of bone was resected off the  distal femur.   The tibia was then subluxated anteriorly and using the extramedullary  guide, I resected approximately 6 mm of bone off the proximal lateral  tibia.  I then checked the cut surface to make sure it was perpendicular  and  it was.  We checked an extension block and found that the knee came  to full extension with a 10-mm block in place.   Based on this cut surface of the proximal tibia, I set the rotation of  the femur based off it.  I sized the femur to be a size 4 from anterior-  posterior dimension, used a narrow component due to the mediolateral  dimension.  The rotation block was then placed with intramedullary rod  in place, and using the 10-mm spacer, rotation was set and the rotation  pins placed.  I then placed the 4-in-1 cutting block.  The anterior,  posterior and chamfer cuts were then made without difficulty nor  notching.  Final box cut was made off the lateral aspect of the distal  femur after I had set the 4 narrow femur component on the cut surface  and marked the box location.   Attention was redirected back to the tibia and the tibia was subluxed  anteriorly.  The cut surface  fit best with the size 3 component and it  was pinned into position through the medial third of the tubercle,  drilled, keeled, and keel punched.  Trial reduction was carried out with  a 4 narrow femur, 3 tibia and a 10-mm insert.  The patella tracked  through the trochlea without application of pressure.  The knee was  stable from extension to flexion with nice ligament balance.   At this point, all trial components were removed, the knee was irrigated  with normal saline solution pulse lavage.  The synovial capsule junction  was injected with 60 mL of 0.25% Marcaine with epinephrine and 1 mL of  Toradol.  Final components were opened and the cement mixed.  The knee  was dried and final components were cemented into position and brought  to extension with the 10-mm insert.  Extruded cement was removed.  Once  cement had cured, excessive cement was removed throughout the knee and  the final 10-mm insert to match the 4 femoral component was then  inserted.  The knee was reirrigated.  We irrigated with normal saline  solution.  A medium Hemovac drain was placed deep.  The extensor  mechanism was then reapproximated over this drain with a #1 Vicryl with  the knee in flexion.  The remainder of the wound was closed with 2-0  Vicryl and running 4-0 Monocryl.  The knee was cleaned, dried and  dressed sterilely with Steri-Strips and a bulky sterile wrap.  She was  brought to the recovery room in stable condition, tolerating the  procedure well.      [name], M.D.  Electronically Signed     MDO/MEDQ  D:  04/22/2008  T:  04/22/2008  Job:  [id number]

## 2010-07-07 NOTE — Assessment & Plan Note (Signed)
[name]                            CARDIOLOGY OFFICE NOTE   PAYGE, EPPES                      MRN:          [id number]  DATE:06/22/2006                            DOB:          December 31, 1955    [name] is a 55 year old female with past medical history of  hypertension, hyperlipidemia, rheumatoid arthritis, gastroesophageal  reflux disease, migraine headaches, who we are asked to evaluate for  chest pain.  She has no prior cardiac history and typically does not  have significant dyspnea on exertion, orthopnea, PND, palpitations,  presyncope, syncope, or exertional chest pain.  She does occasionally  have pedal edema.  On 06/07/2006 the patient was admitted to [facility] with complaints of chest pain.  The pain was in the substernal  and upper chest area.  It was initially described as a sharp pain and  then an aching sensation.  It was non-pleuritic or positional, nor was  it related to food.  It did increase with exertion but it was present  for pretty much the whole day.  There was no associated shortness of  breath, nausea and vomiting, or diaphoresis.  She was admitted to [name] by the [name] service.  She did rule out for myocardial  infarction with serial enzymes.  Also note her hemoglobin and hematocrit  were normal.  A D-Dimer was normal.  She was discharged and scheduled  for followup with me today.  Note, she has had no further chest pain and  there is no dyspnea.  She has on occasion felt her heart skip.   MEDICATIONS:  1. Prevacid 30 mg p.o. b.i.d.  2. Prednisone 5 mg p.o. daily.  3. Enbrel 50 mg p.o. daily.  4. Methotrexate 2.5 mg daily.  5. Lisinopril 20 mg p.o. daily.  6. Premarin 0.9 mg p.o. daily.  7. Folate.  8. Aspirin 81 mg daily.  9. Multivitamins.  10.Zocor 20 mg p.o. daily p.o. q.h.s.  11.Ambien.   SOCIAL HISTORY:  She has remote history of tobacco use but has not  smoked in 20  years.  She does not consume alcohol.   FAMILY HISTORY:  Strongly positive for coronary artery disease, as both  her mother and her sister had myocardial infarctions in their 24's.   PAST MEDICAL HISTORY:  There is no diabetes mellitus, but there is  hypertension and hyperlipidemia.  She has a history of rheumatoid  arthritis.  She also has a history of gastroesophageal reflux disease.  There is a history of migraine headaches as well.  She has had  a prior  cholecystectomy, caesarian section x2, and a hysterectomy.  She also has  a history of hemorrhoids.   ALLERGIES:  She is allergic to PENICILLIN and CODEINE.   REVIEW OF SYSTEMS:  There is no headaches or fevers or chills.  There is  cough and she has had problems with sinusitis recently, as well as  allergies.  There is no dysphagia, odynophagia, melena, or hematochezia.  There is no dysuria or hematuria.  There is no orthopnea, PND but there  is occasionally pedal edema.  She occasionally feels joint pain.  The  remaining systems are negative.   PHYSICAL EXAMINATION:  Blood pressure 140/82 and her pulse is 85.  She  is well-developed and obese.  She is in no acute distress.  Her skin is  warm and dry.  She does not appear depressed and there is no peripheral  clubbing.  Her back is normal.  HEENT:  Significant for ongoing allergies, but otherwise is normal, with  normal eyelids.  NECK:  Supple, with a normal upstroke bilaterally.  I cannot appreciate  bruits.  There is no jugular venous distention and no thyromegaly is  noted.  CHEST:  Clear to auscultation, normal expansion.  CARDIOVASCULAR:  Regular rate and rhythm, normal S1 and S2.  There are  no murmurs, rubs or gallops noted.  ABDOMEN:  Not tender, positive bowel sounds, no hepatosplenomegaly, no  mass appreciated.  There in no abdominal bruit. She has 2+ femoral  pulses bilaterally, and no bruits.  EXTREMITIES:  Show no edema.  I can palpate no cores.  She has 2+   dorsalis pedis pulses bilaterally.  MUSCULOSKELETAL:  Grossly intact.  There is no evidence of arthritis on  examination.   Her EKG shows a sinus rhythm with no ST changes.   DIAGNOSES:  1. Atypical chest pain:  The patient has multiple risk factors      including strong family history, hypertension and hyperlipidemia.      We will plan to risk stratify with a stress Myoview.  We will also      schedule her to have an echocardiogram to quantify left ventricular      function.  If these are unremarkable, then I do not think we need      to pursue further cardiac evaluation.  2. Palpitations:  These have recently begun.  They are described as a      skip.  We will follow these expectantly.  If they worsen then we      can consider a CardioNet monitor.  3. Hypertension:  Her blood pressure is well controlled on her present      medications.  4. Hyperlipidemia:  Zocor was recently initiated and she will need to      follow up with her primary care physician for followup lipids and      liver.  5. Rheumatoid arthritis:  Per primary care physician.  6. Gastroesophageal reflux disease:  Per primary care.   We will see her back in approximately 4 weeks to review the above  information.     Felicia Frieze Jens Som, MD, [name]  Electronically Signed   BSC/MedQ  DD: 06/22/2006  DT: 06/22/2006  Job #: [id number]   cc:   Evelena Peat, M.D.

## 2010-07-07 NOTE — Discharge Summary (Signed)
[name], [name]               ACCOUNT NO.:  [id number]   MEDICAL RECORD NO.:  [id number]          PATIENT TYPE:  INP   LOCATION:  1613                         FACILITY:  [facility]   PHYSICIAN:  Madlyn Frankel. Charlann Boxer, M.D.  DATE OF BIRTH:  1955/05/05   DATE OF ADMISSION:  04/22/2008  DATE OF DISCHARGE:  04/24/2008                               DISCHARGE SUMMARY   ADMITTING DIAGNOSES:  1. Osteoarthritis.  2. Rheumatoid arthritis.  3. Hypertension.  4. Dyslipidemia.  5. Reflux disease.  6. Irritable bowel syndrome.  7. Fibromyalgia.  8. Status scarlet fever.   DISCHARGE DIAGNOSES:  1. Osteoarthritis.  2. Rheumatoid arthritis.  3. Hypertension.  4. Dyslipidemia.  5. Reflux disease.  6. Irritable bowel syndrome.  7. Fibromyalgia.  8. Status scarlet fever.  9. Postoperative hypokalemia.   HISTORY OF PRESENT ILLNESS:  A 55 year old female with a history of left  knee pain secondary to osteoarthritis in the setting of rheumatoid  arthritis.  It was refractory to all conservative treatment.   CONSULTATIONS:  None.   PROCEDURE:  A left total knee replacement by surgeon Dr. Durene Romans.  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   LABORATORY DATA:  CBC - final reading white blood cells 9.3, hemoglobin  10.5, hematocrit 31.6, platelets 261.  White cell differential all  within normal limits.  Coagulation normal.  Metabolic panel - sodium  135, potassium 3, glucose 147, BUN 8, creatinine 0.52.  Calcium was 7.8.  UA showed moderate hemoglobin, negative for nitrites.   HOSPITAL COURSE:  The patient underwent a left total knee replacement.  Was admitted to the orthopedic floor.  Her stay was unremarkable.  She  remained hemodynamically and orthopedically stable throughout her course  of stay.  Hemovac was discontinued on day one, dressing was changed on  day two.  Wound had no significant drainage.  No sign of infection.  She  was neurovascularly intact of her left lower extremity throughout with  improving quad function.  She was weightbearing as tolerated and had met  all criteria for discharge home by day two.  She did have some  postoperative hypokalemia and was replaced with K-Dur on day two.   DISCHARGE DISPOSITION:  Discharged home in stable and improved condition  with home health care selected.   DISCHARGED DIET:  Heart-healthy.   DISCHARGE WOUND CARE:  Keep dry.   DISCHARGE PHYSICAL THERAPY:  Weightbearing as tolerated.   DISCHARGE MEDICATIONS:  1. Aspirin 325 mg one p.o. b.i.d. x6 weeks.  2. Robaxin 500 mg one p.o. q.6h.  3. Iron 325 mg one p.o. t.i.d. x2 weeks.  4. Colace 100 mg one p.o. b.i.d. x2 weeks.  5. MiraLax 17 grams p.o. daily.  6. Oxycodone 5 mg one to three q.3-4h. p.r.n. pain.  7. Phenergan 12.5 mg one p.o. q.6-8h. p.r.n. nausea, vomiting.  8. Enbrel 50 mg; hold for an additional week postoperatively and then      start again as a q weekly injection.  9. Methotrexate 2.5 mg 10 tablets weekly on Friday; hold for 1 week      postoperatively.  10.Prednisone 5 mg  q.a.m.  11.Estrace 1 mg p.o. q.a.m.  12.Lisinopril 20 mg p.o. q.a.m.  13.Prilosec 20 mg p.o. b.i.d.  14.Mepergan q.4-6h. p.r.n.  15.Zocor 20 mg p.o. q.h.s.  16.Ambien 10 mg p.o. q.h.s.  17.Celebrex 200 mg one p.o. b.i.d. x2 weeks after surgery.   DISCHARGE FOLLOWUP:  Follow up with Dr. Charlann Boxer at phone number [phone] in  2 weeks for a wound check.     ______________________________  Yetta Glassman. [address], [place]      Madlyn Frankel. Charlann Boxer, M.D.  Electronically Signed    BLM/MEDQ  D:  05/16/2008  T:  05/16/2008  Job:  [id number]   cc:   Evelena Peat, M.D.   Areatha Keas, M.D.  Fax: [phone]

## 2010-07-07 NOTE — Assessment & Plan Note (Signed)
[facility]                            CARDIOLOGY OFFICE NOTE   Felicia, [name]                      MRN:          [id number]  DATE:09/20/2006                            DOB:          October 31, 1955    [name] is a very pleasant, 55 year old female that I recently saw on  June 22, 2006 secondary to chest pain. She also has a history of  hypertension, hyperlipidemia, rheumatoid arthritis and reflux. At that  time, we scheduled her to have a nuclear study on Jul 04, 2006. This  showed an ejection fraction of 75% and there was no ischemia or  infarction. She also had an echocardiogram on May 12, this showed normal  LV function. There was no significant valvular disease noted. Since that  time she has done well. She denies any dyspnea on exertion, orthopnea,  PND, pedal edema, palpitations, presyncope, syncope, or exertional chest  pain.   CURRENT MEDICATIONS:  1. Prevacid 30 mg p.o. b.i.d.  2. Prednisone 5 mg p.o. daily.  3. Enbrel 50 mg.  4. Lisinopril 20 mg p.o. daily.  5. Premarin 0.9 mg p.o. daily.  6. Folate 1 mg tablets 2 p.o. daily.  7. Aspirin 81 mg p.o. daily.  8. Multivitamin.  9. Zocor 20 mg p.o. q.h.s.  10.Ambien 10 mg p.o. q.h.s.  11.Methotrexate.   PHYSICAL EXAMINATION:  VITAL SIGNS:  Blood pressure 133/83 and her pulse  is 74.  HEENT:  Normal.  CHEST:  Clear.  CARDIOVASCULAR:  Regular rate and rhythm.  EXTREMITIES:  Show no edema.   DIAGNOSES:  1. Recent chest pain now resolved - her Myoview showed normal      perfusion and electrocardiogram showed normal left ventricular      function.  2. We will not pursue further cardiac evaluation.  3. Palpitations. These do not appear to be bothersome but a CardioNet      monitor could be considered in the future if they worsen.  4. Hypertension - her blood pressure is well-controlled on her present      medications.  5. Hyperlipidemia - her Zocor was initiated in the hospital  and we      checked lipids and liver today and I will ask her to followup with      Dr. Caryl Never concerning this issue.  6. Rheumatoid arthritis.  7. Gastroesophageal reflux disease.   We will see her back on an as needed basis.     Madolyn Frieze Jens Som, MD, [facility]  Electronically Signed    BSC/MedQ  DD: 09/20/2006  DT: 09/21/2006  Job #: [id number]   cc:   Evelena Peat, M.D.

## 2010-07-10 NOTE — Op Note (Signed)
[name], [name]               ACCOUNT NO.:  [id number]   MEDICAL RECORD NO.:  [id number]          PATIENT TYPE:  AMB   LOCATION:  ENDO                         FACILITY:  [facility]   PHYSICIAN:  Anselmo Rod, M.D.  DATE OF BIRTH:  04-Jan-1956   DATE OF PROCEDURE:  04/30/2005  DATE OF DISCHARGE:  04/30/2005                                 OPERATIVE REPORT   PROCEDURE PERFORMED:  Screening colonoscopy.   ENDOSCOPIST:  Charna Elizabeth, M.D.   INSTRUMENT USED:  Olympus video colonoscope.   INDICATIONS FOR PROCEDURE:  A 55 year old white female with a family history  of colon cancer in a sister who was diagnosed at 36 undergoing a screening  colonoscopy.  Rule out colonic polyps, masses, etc.   PREPROCEDURE PREPARATION:  Informed consent was procured from the patient.  The patient was fasted for four hours prior to the procedure and prepped  with OsmoPrep pills the night of and the morning of the procedure.  The  risks and benefits of the procedure including a 10% miss rate for cancer or  polyps was discussed with the patient as well.   PREPROCEDURE PHYSICAL:  The patient had stable vital signs.  Neck supple.  Chest clear to auscultation.  S1 and S2 regular.  Abdomen soft with normal  bowel sounds.   DESCRIPTION OF PROCEDURE:  The patient was placed in left lateral decubitus  position and sedated with an additional 125 mcg of fentanyl and 10 mg of  Versed in slow incremental doses.  Once the patient was adequately sedated  and maintained on low flow oxygen and continuous cardiac monitoring, the  Olympus video colonoscope was advanced from the rectum to the cecum.  The  appendicular orifice and ileocecal valve were clearly visualized and  photographed.  There was evidence of internal hemorrhoids seen on  retroflexion.  No masses, polyps, erosions, ulcerations or diverticula were  seen.  The patient had significant abdominal discomfort with insufflation of  air into the colon indicating  a component of visceral hypersensitivity  consistent with irritable bowel syndrome.   IMPRESSION:  1.  Normal colonoscopy up to the cecum except for small nonbleeding internal      hemorrhoids.  2.  Question visceral hypersensitivity noted on colonoscopy.  Patient      probably has irritable bowel syndrome.   RECOMMENDATIONS:  1.  Continue high fiber diet with liberal fluid intake.  2.  Repeat colonoscopy is recommended in the next five years considering the      patient's family history of colon cancer in a first degree relative.  3.  Outpatient followup in the next two weeks for further recommendations.      Anselmo Rod, M.D.  Electronically Signed     JNM/MEDQ  D:  05/02/2005  T:  05/03/2005  Job:  [id number]   cc:   Teena Irani. Arlyce Dice, M.D.  Fax: [phone]   Areatha Keas, M.D.  Fax: [phone]

## 2010-07-10 NOTE — Discharge Summary (Signed)
[name], [name]               ACCOUNT NO.:  [id number]   MEDICAL RECORD NO.:  [id number]          PATIENT TYPE:  INP   LOCATION:  4731                         FACILITY:  [facility]   PHYSICIAN:  Altha Harm, MDDATE OF BIRTH:  1955-07-14   DATE OF ADMISSION:  06/07/2006  DATE OF DISCHARGE:  06/08/2006                               DISCHARGE SUMMARY   DISCHARGE DISPOSITION:  Home.   FINAL DISCHARGE DIAGNOSES:  1. Chest pain, atypical.  2. Hyperlipidemia, new diagnosis.  3. History of hypertension.  4. History of rheumatoid arthritis.  5. History of gastroesophageal reflux disease.  6. History of migraines.  7. History of urinary tract infections.  8. History of internal hemorrhoids.   DISCHARGE MEDICATIONS:  1. Zocor 20 mg p.o. h.s.  2. Nitroglycerin 0.4 mg sublingual q. 5 minutes x 3 p.r.n.  3. Prednisone 5 mg p.o. daily.  4. Enbrel 50 mg IM weekly on Fridays.  5. Methotrexate 12.5 mg p.o. weekly on Fridays.  6. Lisinopril 20 mg p.o. daily.  7. Prevacid 30 mg p.o. daily.  8. Premarin 0.9 mg p.o. daily.  9. Folate 1 mg p.o. daily.  10.Ambien 5-10 mg p.o. h.s.  11.Tylenol 650 mg p.o. q. 4 p.r.n.  12.Aspirin 81 mg p.o. daily.   CONSULTANTS:  None.   PROCEDURES:  None.   DIAGNOSTIC STUDIES:  Chest x-ray, which shows no acute findings.   PERTINENT LABORATORY STUDIES:  At time of discharge, the patient had a  troponin of less than 0.1, a CK of 62, and CK-MB of 1.1, sodium of 142,  potassium of 3.8, chloride 109, bicarb 26, BUN 12, creatinine 0.62.  White blood cell count of 7, hemoglobin of 13.6, platelets of 332,000.  Patient had a total cholesterol of 217 and an LDL elevated at 122.   PRIMARY CARE PHYSICIAN:  Evelena Peat, M.D.   CODE STATUS:  Full code.   ALLERGIES:  CODEINE AND PENICILLIN.   CHIEF COMPLAINT:  Chest pain.   HOSPITAL COURSE:  1. Chest pain:  Patient was ruled out for resting ischemia with serial      enzymes.  Patient had resolution of  her pain after being admitted      to the hospital.  Patient was able to ambulate without any further      chest pain.  I discussed with the patient the importance of having      a stress test, given her family history of hypertension, and the      patient is opting to have her stress test as an outpatient.  2. Hyperlipidemia:  Patient was found to have elevated LDL and was      started on Zocor 20 mg p.o. h.s.  The patient should have her LFTs      and her cholesterol repeated in approximately six weeks.   All other medical problems remain stable.   CONDITION ON DISCHARGE:  Stable.   DIETARY RESTRICTIONS:  Patient should be on a low-cholesterol, low-  sodium diet.   PHYSICAL RESTRICTIONS:  Activity as tolerated.   RECOMMENDATIONS FOR FOLLOWUP:  Patient should have a  stress test done as  an outpatient.   RECOMMENDATIONS FOR FOLLOWUP LABS:  Check LFTs and cholesterol in six  weeks.      Altha Harm, MD  Electronically Signed     MAM/MEDQ  D:  06/08/2006  T:  06/08/2006  Job:  [id number]   cc:   Evelena Peat, M.D.

## 2010-07-10 NOTE — Op Note (Signed)
[name], [name]               ACCOUNT NO.:  [id number]   MEDICAL RECORD NO.:  [id number]          PATIENT TYPE:  AMB   LOCATION:  ENDO                         FACILITY:  [facility]   PHYSICIAN:  Anselmo Rod, M.D.  DATE OF BIRTH:  [birth year]   DATE OF PROCEDURE:  05/02/2005  DATE OF DISCHARGE:  04/30/2005                                 OPERATIVE REPORT   PROCEDURE PERFORMED:  Esophagogastroduodenoscopy.   ENDOSCOPIST:  Charna Elizabeth, M.D.   INSTRUMENT USED:  Olympus video panendoscope.   INDICATIONS FOR PROCEDURE:  The patient is a 55 year old white female with a  history of epigastric pain and reflux. Rule out peptic ulcer disease,  esophagitis, gastritis, Barrett's mucosa, etc.   PREPROCEDURE PREPARATION:  Informed consent was procured from the patient.  The patient was fasted for four hours prior to the procedure.   PREPROCEDURE PHYSICAL:  The patient had stable vital signs.  Neck supple,  chest clear to auscultation.  S1, S2 regular.  Abdomen soft with normal  bowel sounds.   DESCRIPTION OF PROCEDURE:  The patient was placed in the left lateral  decubitus position and sedated with 75 mcg of fentanyl and 10 mg of Versed  in slow incremental doses.  Once the patient was adequately sedated and  maintained on low-flow oxygen and continuous cardiac monitoring, the Olympus  video panendoscope was advanced through the mouth piece over the tongue into  the esophagus under direct vision.  The entire esophagus appeared normal  with no evidence of ring, stricture, masses, esophagitis or Barrett's  mucosa.  The scope was then advanced to the stomach.  The entire gastric  mucosa and proximal small bowel appeared normal.  Retroflexion in the high  cardia revealed no abnormalities, no ulcers, erosions, masses or polyps were  seen.  There was no evidence of Barrett's mucosa or esophagitis.  Proximal  small bowel appeared normal.  There was no outlet obstruction.  The patient  tolerated  the procedure well without immediate complications.   IMPRESSION:  Normal esophagogastroduodenoscopy.   RECOMMENDATIONS:  Proceed with colonoscopy at this time.  Further  recommendations will be made thereafter.      Anselmo Rod, M.D.  Electronically Signed     JNM/MEDQ  D:  05/02/2005  T:  05/03/2005  Job:  [id number]   cc:   Teena Irani. Arlyce Dice, M.D.  Fax: [phone]   Areatha Keas, M.D.  Fax: [phone]

## 2010-07-10 NOTE — H&P (Signed)
NAMEMARIENA, [name]               ACCOUNT NO.:  [id number]   MEDICAL RECORD NO.:  [id number]           PATIENT TYPE:   LOCATION:                               FACILITY:  [facility]   PHYSICIAN:  Elliot Cousin, M.D.    DATE OF BIRTH:  [birth year]   DATE OF ADMISSION:  06/07/2006  DATE OF DISCHARGE:                              HISTORY & PHYSICAL   PRIMARY CARE PHYSICIAN:  Evelena Peat, M.D.   CHIEF COMPLAINT:  Chest pain.   HISTORY OF PRESENT ILLNESS:  The patient is a 55 year old woman with a  past medical history significant for hypertension and rheumatoid  arthritis, who presents to the emergency department with a chief  complaint of chest pain.  Her chest pain started this morning at  approximately 9:30 a.m.  It started while she was getting dressed.  The  pain at the time was described as sharp.  It was mild-to-moderate in  intensity; however, the pain intensified as the day went on.  The pain  has been intermittent throughout the day.  Initially, the pain lasted a  few seconds; however, as the day went on, the pain lasted for a few  minutes.  The pain is located in the upper chest and substernal area.  It is accompanied by intermittent shortness of breath, radiation to the  neck, back, and the left arm, lightheadedness, nausea, and sweating.  She says that the pain seems to increase with activity and decreases  with rest.   Her review of systems is positive for a recent urinary tract infection  treated with Septra DS.  She also had several episodes of vomiting four  days ago without associated abdominal pain.  She also had subjective  fever and chills 3-4 days ago but not now.  Her review of systems is  also positive for fatigue, nasal congestion, and a runny nose.   The patient was evaluated by her primary care physician, Dr. Doristine Counter,  earlier today.  She was given one sublingual nitroglycerin in the  office, which decreased her pain from a 6/10 in intensity to 1-2/10 in  intensity.  When she arrived to the emergency department, the pain  returned.  She was given 2-3 sublingual nitroglycerin, which decreased  her pain to a 1-2/10.  She is currently hemodynamically stable.  Her EKG  reveals a normal sinus rhythm without any acute abnormalities.  Her  chest x-ray is nonacute.  Her initial cardiac markers are negative.  She  has a positive family history of myocardial infarction.  She will be  admitted for further evaluation and management.   PAST MEDICAL HISTORY:  1. Hypertension.  2. Rheumatoid arthritis.  3. Gastroesophageal reflux disease.  4. Migraine headaches.  5. Urinary tract infection, diagnosed approximately two weeks ago.      Treated with Septra.  6. History of total abdominal hysterectomy.  7. Normal EGD in March, 2007 per Dr. Loreta Ave.  8. Internal hemorrhoid per colonoscopy in March, 2007, per Dr. Loreta Ave.   MEDICATIONS:  1. Aspirin 81 mg daily.  2. Premarin 0.9 mg daily.  3. Lisinopril 20 mg  daily.  4. Prevacid 30 mg daily.  5. Prednisone 5 mg daily.  6. Methotrexate 2.5 mg five tablets every Friday (followed by      rheumatologist, Dr. Phylliss Bob).  7. Ambien 10 mg daily.  8. Enbrel 1 injection every Friday.   ALLERGIES:  The patient has allergies to PENICILLIN and CODEINE.   SOCIAL HISTORY:  The patient is married.  She has two children.  [place]  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  She denies  tobacco, alcohol, and illicit drug use.   FAMILY HISTORY:  Her mother died of metastatic cancer at 35 years of  age.  She had a myocardial infarction at 66 years of age.  Her father is  5 years of age and has kidney disease and hypertension.  One of her  sisters had a myocardial infarction at 63 years of age.   REVIEW OF SYSTEMS:  As above in the history of present illness.   PHYSICAL EXAMINATION:  VITAL SIGNS:  Temperature 98, blood pressure  105/47, pulse 68, respiratory rate 16, oxygen saturation 96% on room  air.   GENERAL:  The patient is a pleasant 55 year old overweight Caucasian  woman who is currently sitting up in bed in no acute distress.  HEENT:  Head is normocephalic and atraumatic.  Pupils are equal, round  and reactive to light.  Extraocular movements are intact.  Conjunctivae  are clear.  Sclerae are white.  Tympanic membranes are clear  bilaterally.  Nasal mucosa is edematous with no active drainage.  No  sinus tenderness.  Oropharynx reveals good dentition.  Mucous membranes  are mildly dry.  No posterior exudate.  No erythema.  NECK:  Supple.  No adenopathy.  No thyromegaly.  No bruits.  No JVD.  LUNGS:  Clear to auscultation bilaterally.  HEART:  S1 and S2 with no murmurs, rubs or gallops.  ABDOMEN:  Obese.  Positive bowel sounds.  Soft, nontender, nondistended.  No hepatosplenomegaly.  No masses palpated.  EXTREMITIES:  Pedal pulses are 2+ bilaterally.  No pretibial edema, and  no pedal edema.  NEUROLOGIC:  Patient is alert and oriented x3.  Cranial nerves II-XII  are intact.  Strength is 5/5 throughout.  Sensation is intact.   ADMISSION LABORATORIES:  EKG reveals a normal sinus rhythm with a heart  rate of 70 beats per minute.  No acute ST-T wave abnormalities.   Chest x-ray reported as no acute disease (official report not  available).   Sodium 140, potassium 4.1, chloride 109, BUN 14, glucose 119,  bicarbonate 25, creatinine 0.7, myoglobin 66.5, CK-MB 1.2, troponin I  less than 0.05.  WBC 9.1, hemoglobin 12.8, platelets 401, D-dimer less  than 0.22.   ASSESSMENT:  1. Chest pain:  The patient's chest pain is worrisome for angina, as      she provides all of the typical symptoms associated with chest pain      related to angina. Her pain was relieved somewhat by nitroglycerin.      However, the patient's initial cardiac markers are negative, and      her EKG is completely within normal limits. 2. Hypertension:  The patient's blood pressure is well controlled.      She is  chronically treated with lisinopril.  3. Rheumatoid arthritis:  The patient is chronically treated with      methotrexate, Enbrel, and prednisone.  4. Allergic rhinitis versus upper respirtory infection.   PLAN:  1. The patient will be admitted  for further evaluation and management.  2. Will start treatment with nitro paste, aspirin, full-dose Lovenox,      Lopressor, p.r.n. morphine, and oxygen therapy.  3. Will check cardiac enzymes q.8h. x3.  4. Will hold the Premarin for now, as it increases the risk of      myocardial infarction.  5. Will assess the patient's TSH and fasting lipid panel.  6. Supportive care, prn Robitussin and Nasonex.  7. If the patient completely rules out for a myocardial infarction      and/or if her EKG remains negative, the patient can be discharged      to home.  Consider outpatient stress test and/or outpatient      cardiology evaluation.      Elliot Cousin, M.D.  Electronically Signed     DF/MEDQ  D:  06/07/2006  T:  06/08/2006  Job:  [id number]   cc:   Evelena Peat, M.D.

## 2010-08-21 ENCOUNTER — Other Ambulatory Visit: Payer: Self-pay | Admitting: Orthopedic Surgery

## 2010-08-21 ENCOUNTER — Encounter (HOSPITAL_COMMUNITY): Payer: Medicare Other

## 2010-08-21 ENCOUNTER — Ambulatory Visit (HOSPITAL_COMMUNITY)
Admission: RE | Admit: 2010-08-21 | Discharge: 2010-08-21 | Disposition: A | Discharge status: Home / Self Care | Payer: Medicare Other | Source: Ambulatory Visit | Attending: Orthopedic Surgery | Admitting: Orthopedic Surgery

## 2010-08-21 ENCOUNTER — Other Ambulatory Visit (HOSPITAL_COMMUNITY): Payer: Self-pay | Admitting: Orthopedic Surgery

## 2010-08-21 DIAGNOSIS — R05 Cough: Secondary | ICD-10-CM | POA: Insufficient documentation

## 2010-08-21 DIAGNOSIS — Z01810 Encounter for preprocedural cardiovascular examination: Secondary | ICD-10-CM | POA: Insufficient documentation

## 2010-08-21 DIAGNOSIS — Z01812 Encounter for preprocedural laboratory examination: Secondary | ICD-10-CM | POA: Insufficient documentation

## 2010-08-21 DIAGNOSIS — Y831 Surgical operation with implant of artificial internal device as the cause of abnormal reaction of the patient, or of later complication, without mention of misadventure at the time of the procedure: Secondary | ICD-10-CM | POA: Insufficient documentation

## 2010-08-21 DIAGNOSIS — T84039A Mechanical loosening of unspecified internal prosthetic joint, initial encounter: Secondary | ICD-10-CM | POA: Insufficient documentation

## 2010-08-21 DIAGNOSIS — Z01818 Encounter for other preprocedural examination: Secondary | ICD-10-CM

## 2010-08-21 DIAGNOSIS — I10 Essential (primary) hypertension: Secondary | ICD-10-CM | POA: Insufficient documentation

## 2010-08-21 DIAGNOSIS — R059 Cough, unspecified: Secondary | ICD-10-CM | POA: Insufficient documentation

## 2010-08-21 DIAGNOSIS — Z79899 Other long term (current) drug therapy: Secondary | ICD-10-CM | POA: Insufficient documentation

## 2010-08-21 LAB — CBC
HCT: 37.4 % (ref 36.0–46.0)
Hemoglobin: 12.2 g/dL (ref 12.0–15.0)
MCH: 31 pg (ref 26.0–34.0)
MCHC: 32.6 g/dL (ref 30.0–36.0)
MCV: 95.2 fL (ref 78.0–100.0)
Platelets: 323 10*3/uL (ref 150–400)
RBC: 3.93 MIL/uL (ref 3.87–5.11)
RDW: 14.5 % (ref 11.5–15.5)
WBC: 7.6 10*3/uL (ref 4.0–10.5)

## 2010-08-21 LAB — PROTIME-INR
INR: 0.96 (ref 0.00–1.49)
Prothrombin Time: 13 seconds (ref 11.6–15.2)

## 2010-08-21 LAB — URINE MICROSCOPIC-ADD ON

## 2010-08-21 LAB — DIFFERENTIAL
Basophils Absolute: 0 10*3/uL (ref 0.0–0.1)
Basophils Relative: 0 % (ref 0–1)
Eosinophils Absolute: 0.1 10*3/uL (ref 0.0–0.7)
Eosinophils Relative: 1 % (ref 0–5)
Lymphocytes Relative: 26 % (ref 12–46)
Lymphs Abs: 2 10*3/uL (ref 0.7–4.0)
Monocytes Absolute: 0.9 10*3/uL (ref 0.1–1.0)
Monocytes Relative: 12 % (ref 3–12)
Neutro Abs: 4.6 10*3/uL (ref 1.7–7.7)
Neutrophils Relative %: 61 % (ref 43–77)

## 2010-08-21 LAB — URINALYSIS, ROUTINE W REFLEX MICROSCOPIC
Bilirubin Urine: NEGATIVE
Glucose, UA: NEGATIVE mg/dL
Ketones, ur: NEGATIVE mg/dL
Leukocytes, UA: NEGATIVE
Nitrite: NEGATIVE
Protein, ur: NEGATIVE mg/dL
Specific Gravity, Urine: 1.023 (ref 1.005–1.030)
Urobilinogen, UA: 0.2 mg/dL (ref 0.0–1.0)
pH: 6.5 (ref 5.0–8.0)

## 2010-08-21 LAB — BASIC METABOLIC PANEL
BUN: 14 mg/dL (ref 6–23)
CO2: 30 mEq/L (ref 19–32)
Calcium: 9.2 mg/dL (ref 8.4–10.5)
Chloride: 103 mEq/L (ref 96–112)
Creatinine, Ser: 0.49 mg/dL — ABNORMAL LOW (ref 0.50–1.10)
GFR calc Af Amer: >60 mL/min (ref >60)
GFR calc non Af Amer: >60 mL/min (ref >60)
Glucose, Bld: 91 mg/dL (ref 70–99)
Potassium: 3.9 mEq/L (ref 3.5–5.1)
Sodium: 140 mEq/L (ref 135–145)

## 2010-08-21 LAB — SURGICAL PCR SCREEN
MRSA, PCR: NEGATIVE
Staphylococcus aureus: NEGATIVE

## 2010-08-21 LAB — APTT: aPTT: 30 seconds (ref 24–37)

## 2010-08-24 ENCOUNTER — Ambulatory Visit (INDEPENDENT_AMBULATORY_CARE_PROVIDER_SITE_OTHER): Payer: Medicare Other | Admitting: Family Medicine

## 2010-08-24 ENCOUNTER — Encounter: Payer: Self-pay | Admitting: Family Medicine

## 2010-08-24 DIAGNOSIS — M069 Rheumatoid arthritis, unspecified: Secondary | ICD-10-CM

## 2010-08-24 DIAGNOSIS — I10 Essential (primary) hypertension: Secondary | ICD-10-CM

## 2010-08-24 DIAGNOSIS — E785 Hyperlipidemia, unspecified: Secondary | ICD-10-CM

## 2010-08-24 NOTE — Progress Notes (Signed)
  Subjective:    Patient ID: [name], female    DOB: 01-17-1956, 55 y.o.   MRN: [id number]  HPI Patient seen for preoperative visit. She's had prior history of total knee replacement and is set for revision next week. She denies any history of CAD or any chronic lung problems. She is an ex-smoker. She had recent EKG which is reviewed and showed normal sinus rhythm with no acute findings. Chest x-ray revealed no acute findings. She had CBC, coagulopathy studies with PT and PTT and basic metabolic panel and these were reviewed and normal.  She denies any recent dyspnea or chest pain. She had recent sinus surgery which went well and has had symptomatic improvement in her sinuses since then. Medications are reviewed. Takes rheumatoid drugs including Enbrel, prednisone, and methotrexate. Her Enbrel will be held during hospitalization   Review of Systems  Constitutional: Negative for fever, chills, activity change, appetite change and fatigue.  Respiratory: Negative for cough, shortness of breath and wheezing.   Cardiovascular: Negative for chest pain, palpitations and leg swelling.  Gastrointestinal: Negative for blood in stool.  Genitourinary: Negative for dysuria.  Neurological: Negative for dizziness, syncope, weakness and headaches.  Hematological: Negative for adenopathy. Does not bruise/bleed easily.       Objective:   Physical Exam  Constitutional: She is oriented to person, place, and time. She appears well-developed and well-nourished. No distress.  HENT:  Right Ear: External ear normal.  Left Ear: External ear normal.  Mouth/Throat: Oropharynx is clear and moist. No oropharyngeal exudate.  Eyes: Pupils are equal, round, and reactive to light.  Neck: Neck supple. No thyromegaly present.  Cardiovascular: Normal rate, regular rhythm and normal heart sounds.  Exam reveals no gallop.   No murmur heard. Pulmonary/Chest: Effort normal and breath sounds normal. No respiratory  distress. She has no wheezes. She has no rales.  Musculoskeletal: She exhibits no edema.  Lymphadenopathy:    She has no cervical adenopathy.  Neurological: She is alert and oriented to person, place, and time. No cranial nerve deficit.  Psychiatric: She has a normal mood and affect.          Assessment & Plan:  Patient has many chronic problems including history of rheumatoid arthritis, hyperlipidemia, hypertension, and GERD. Preop clearance for knee surgery. Studies above reviewed. No contraindications. She will discuss with rheumatologist regarding holding her Enbrel and methotrexate perioperatively.

## 2010-08-31 NOTE — H&P (Addendum)
[name], [name]               ACCOUNT NO.:  [id number]  MEDICAL RECORD NO.:  [id number]  LOCATION:                                 FACILITY:  PHYSICIAN:  Madlyn Frankel. Charlann Boxer, M.D.  DATE OF BIRTH:  [birth year]  DATE OF ADMISSION:  09/01/2010 DATE OF DISCHARGE:                             HISTORY & PHYSICAL   DATE OF SURGERY:  September 01, 2010.  ADMITTING DIAGNOSIS:  Failed total knee arthroplasty, left knee.  HISTORY OF PRESENT ILLNESS:  This is a 54-year lady with a history of total knee arthroplasty in 2010 on the left, who had aseptic loosening. She has had negative labs for infection, but continued pain in the positive bone scan.  Due to her continued symptoms, she is now scheduled for revision, total knee arthroplasty on the left.  The surgery risks, benefits, and aftercare were discussed with the patient.  Questions invited and answered.  Note that she is a candidate for tranexamic acid and will receive that in the preop holding, and she was given her home medications of aspirin, Robaxin, iron, MiraLax, and Colace at preop H and P.  Note that her medical doctors are Dr. Dierdre Forth and Dr. Catha Nottingham.  PAST MEDICAL HISTORY:  Drug allergy to PENICILLIN and CODEINE.  CURRENT MEDICATIONS: 1. Enbrel 50 mg weekly. 2. Methotrexate 1 mg weekly. 3. Lisinopril 20 mg daily. 4. Prednisone 5 mg daily. 5. Estrace 1 mg daily. 6. Prilosec 20 mg daily. 7. Aspirin 81 mg daily. 8. Zocor 20 mg daily. 9. Zoloft 50 mg daily. 10.Ambien 10 mg at bedtime.  MEDICAL ILLNESSES:  Include, rheumatoid arthritis, hypertension, hyperlipidemia, and depression.  Previous surgeries include hysterectomy, C-section x2, cholecystectomy, foot surgery, and total knee arthroplasty on the left.  FAMILY HISTORY:  Positive for cancer and diabetes.  SOCIAL HISTORY:  The patient is married.  She does not smoke and does not drink.  REVIEW OF SYSTEMS:  CENTRAL NERVOUS SYSTEM:  Negative for headache, blurred  vision, or dizziness.  PULMONARY:  Negative for shortness of breath, PND, or orthopnea.  CARDIOVASCULAR:  Negative for chest pain or palpitation.  GI: Negative for ulcers, hepatitis.  GU: Negative for urinary tract difficulty.  MUSCULOSKELETAL:  Positive in HPI.  PHYSICAL EXAM:  VITAL SIGNS:  BP 130/88, respirations 16, pulse 74 and regular. GENERAL APPEARANCE:  This is a well-developed, well-nourished lady, in no acute distress. HEENT:  Head normocephalic.  Nose patent.  Pupils are equal, round, and reactive to light.  Throat without injection. NECK:  Supple without adenopathy.  Carotids are 2+ without bruit. CHEST:  Clear to auscultation.  No rales or rhonchi.  Respirations 60. HEART:  Regular rate and rhythm at 74 beats without murmur. ABDOMEN:  Soft.  Active bowel sounds.  No masses or organomegaly. NEUROLOGIC:  The patient is alert and oriented to time, place, and person.  Cranial nerves II-XII grossly intact. EXTREMITIES:  Shows left knee with 3 degrees flexion traction, further flexion to 100 degrees.  Neurovascular status intact.  Scar is well healed.  No calf tenderness.  No cords.  Negative Homans' sign.  ASSESSMENT:  Failed total knee arthroplasty on the left.  PLAN:  Revision total knee  arthroplasty on the left.     Jaquelyn Bitter. Chabon, P.A.   ______________________________ [name], M.D.    SJC/MEDQ  D:  08/18/2010  T:  08/19/2010  Job:  [id number]  Electronically Signed by Jodene Nam P.A. on 08/30/2010 04:49:56 PM Electronically Signed by Durene Romans M.D. on 08/31/2010 09:15:40 AM

## 2010-09-01 ENCOUNTER — Inpatient Hospital Stay (HOSPITAL_COMMUNITY)
Admission: RE | Admit: 2010-09-01 | Discharge: 2010-09-04 | DRG: 468 | Disposition: A | Discharge status: Home Health | Payer: Medicare Other | Source: Ambulatory Visit | Attending: Orthopedic Surgery | Admitting: Orthopedic Surgery

## 2010-09-01 DIAGNOSIS — Z01812 Encounter for preprocedural laboratory examination: Secondary | ICD-10-CM

## 2010-09-01 DIAGNOSIS — Z79899 Other long term (current) drug therapy: Secondary | ICD-10-CM

## 2010-09-01 DIAGNOSIS — Z7982 Long term (current) use of aspirin: Secondary | ICD-10-CM

## 2010-09-01 DIAGNOSIS — M069 Rheumatoid arthritis, unspecified: Secondary | ICD-10-CM | POA: Diagnosis present

## 2010-09-01 DIAGNOSIS — I10 Essential (primary) hypertension: Secondary | ICD-10-CM | POA: Diagnosis present

## 2010-09-01 DIAGNOSIS — T84039A Mechanical loosening of unspecified internal prosthetic joint, initial encounter: Principal | ICD-10-CM | POA: Diagnosis present

## 2010-09-01 DIAGNOSIS — Z96659 Presence of unspecified artificial knee joint: Secondary | ICD-10-CM

## 2010-09-01 DIAGNOSIS — E785 Hyperlipidemia, unspecified: Secondary | ICD-10-CM | POA: Diagnosis present

## 2010-09-01 DIAGNOSIS — F3289 Other specified depressive episodes: Secondary | ICD-10-CM | POA: Diagnosis present

## 2010-09-01 DIAGNOSIS — E669 Obesity, unspecified: Secondary | ICD-10-CM | POA: Diagnosis present

## 2010-09-01 DIAGNOSIS — Y831 Surgical operation with implant of artificial internal device as the cause of abnormal reaction of the patient, or of later complication, without mention of misadventure at the time of the procedure: Secondary | ICD-10-CM | POA: Diagnosis present

## 2010-09-01 DIAGNOSIS — F329 Major depressive disorder, single episode, unspecified: Secondary | ICD-10-CM | POA: Diagnosis present

## 2010-09-01 DIAGNOSIS — Z01810 Encounter for preprocedural cardiovascular examination: Secondary | ICD-10-CM

## 2010-09-01 LAB — GRAM STAIN: Gram Stain: NONE SEEN

## 2010-09-01 LAB — TYPE AND SCREEN
ABO/RH(D): B POS
Antibody Screen: NEGATIVE

## 2010-09-02 LAB — BASIC METABOLIC PANEL
BUN: 13 mg/dL (ref 6–23)
CO2: 28 mEq/L (ref 19–32)
Calcium: 8.1 mg/dL — ABNORMAL LOW (ref 8.4–10.5)
Chloride: 105 mEq/L (ref 96–112)
Creatinine, Ser: 0.59 mg/dL (ref 0.50–1.10)
GFR calc Af Amer: >60 mL/min (ref >60)
GFR calc non Af Amer: >60 mL/min (ref >60)
Glucose, Bld: 103 mg/dL — ABNORMAL HIGH (ref 70–99)
Potassium: 3.9 mEq/L (ref 3.5–5.1)
Sodium: 138 mEq/L (ref 135–145)

## 2010-09-02 LAB — CBC
HCT: 31.8 % — ABNORMAL LOW (ref 36.0–46.0)
Hemoglobin: 10.4 g/dL — ABNORMAL LOW (ref 12.0–15.0)
MCH: 31.1 pg (ref 26.0–34.0)
MCHC: 32.7 g/dL (ref 30.0–36.0)
MCV: 95.2 fL (ref 78.0–100.0)
Platelets: 276 10*3/uL (ref 150–400)
RBC: 3.34 MIL/uL — ABNORMAL LOW (ref 3.87–5.11)
RDW: 14.1 % (ref 11.5–15.5)
WBC: 11.7 10*3/uL — ABNORMAL HIGH (ref 4.0–10.5)

## 2010-09-03 LAB — CBC
HCT: 34.9 % — ABNORMAL LOW (ref 36.0–46.0)
Hemoglobin: 11.3 g/dL — ABNORMAL LOW (ref 12.0–15.0)
MCH: 31.1 pg (ref 26.0–34.0)
MCHC: 32.4 g/dL (ref 30.0–36.0)
MCV: 96.1 fL (ref 78.0–100.0)
Platelets: 262 10*3/uL (ref 150–400)
RBC: 3.63 MIL/uL — ABNORMAL LOW (ref 3.87–5.11)
RDW: 14.4 % (ref 11.5–15.5)
WBC: 12 10*3/uL — ABNORMAL HIGH (ref 4.0–10.5)

## 2010-09-03 LAB — BASIC METABOLIC PANEL
BUN: 10 mg/dL (ref 6–23)
CO2: 25 mEq/L (ref 19–32)
Calcium: 8.6 mg/dL (ref 8.4–10.5)
Chloride: 105 mEq/L (ref 96–112)
Creatinine, Ser: 0.57 mg/dL (ref 0.50–1.10)
GFR calc Af Amer: >60 mL/min (ref >60)
GFR calc non Af Amer: >60 mL/min (ref >60)
Glucose, Bld: 117 mg/dL — ABNORMAL HIGH (ref 70–99)
Potassium: 3.7 mEq/L (ref 3.5–5.1)
Sodium: 138 mEq/L (ref 135–145)

## 2010-09-05 LAB — BODY FLUID CULTURE
Culture: NO GROWTH
Gram Stain: NONE SEEN

## 2010-09-06 LAB — ANAEROBIC CULTURE: Gram Stain: NONE SEEN

## 2010-09-07 NOTE — Op Note (Signed)
[name], [name]               ACCOUNT NO.:  [id number]  MEDICAL RECORD NO.:  [id number]  LOCATION:  1619                         FACILITY:  [facility]  PHYSICIAN:  Madlyn Frankel. Charlann Boxer, M.D.  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  PROCEDURE:  Revision left total knee replacement utilizing DePuy components, size 4 narrow femur with size 2, 4 mm distal medial and lateral augments, a 30 mm cemented sleeve with a +2 bolt with a size 3 MBT tibial tray with a 29 cemented sleeve, with a 15 mm insert to match the 4 femur posterior stabilized.  SURGEON:  Madlyn Frankel. Charlann Boxer, MD  ASSISTANT:  Jaquelyn Bitter. Chabon, PA  ANESTHESIA:  General plus a preoperative regional femoral nerve block.  SPECIMENS:  I did send left knee joint synovial fluid which was clear upon normal appearance.  Culture came back negative for gram stain.  No white blood cells.  No bacteria seen.  DRAINS:  One Hemovac.  TOURNIQUET:  70 minutes at 250 mmHg to 350 mmHg.  The patient was stable to recovery room.  INDICATIONS FOR PROCEDURE:  [name] is a 55 year old female who unfortunately is a patient of mine with previous left index total knee replacement done in March 2010.  This was complicated by postoperative arthrofibrosis requiring manipulation in July 2010.  She subsequently had improved in her overall motion, but has had persistent pain.  Workup was negative for infection at this duration of time away from surgery, however, bone scan was concerned for loosening based on increased uptake around the tibial component at least as well as femoral component.  Following further workup and rule out infection, she was consented for revision total knee replacement.  She is eager to have  this done to help with pain relief.  Risks of persistent recurring problems as well as infection, DVT were all discussed and reviewed.  Consent was obtained for above.  PROCEDURE IN DETAIL:  The patient was brought to operative theater. Once adequate anesthesia, preoperative antibiotics, Ancef administered, she was positioned supine with left thigh tourniquet placed.  Left lower extremity was then prepped and draped in sterile fashion.  A time-out was performed identifying the patient, planned procedure, and extremity. The DeMayo leg holder was used.  The leg was exsanguinated, tourniquet elevated to 250 mmHg.  Midline incision was made from her previous incision.  Soft tissue plane was created in extensor mechanism.  An arthrotomy then made medially.  Normal synovial fluid encountered, but I did send it for culture for documentation purposes.  Following initial exposure and synovectomy of the medial and lateral and anterior aspect of the knee, the patella was subluxated laterally, proximal medial peel allowed for exposure of the tibia.  The tibial component appeared to be relatively loose with little effort to get it to jar loose.  At this point, I kept it in place and then instead worked on removing the femoral component.  Using a thin ACL saw, the  anterior phalanx, chamfers and distal aspect of femur were worked on.  The femoral component were removed with minimal bone loss on the lateral anterior chamfer area, lateral chamfer area.  I removed remaining cement on the distal femur.  At this point, the tibia was subluxated anteriorly and the tibial tray was removed fairly easily.  I then used an oscillating saw and removed the proximal aspect of the tibia cement mantle.  Then using combination of a small drill through the tibial plug as well osteotomes and rongeurs, the remaining cement was removed.  At this point after removing all cement and debris in this area, the femoral  canal was opened with a canal finder and then reamed up to 15 mm reamers for possible use with stems.  Once this was done to an appropriate depth, I went ahead and prepared the proximal tibia.  We reestablished the proximal tibia cut using the extramedullary guide. This was only cut to freshen it up.  Slope was set at 2 degrees to allow for my placement of MBT revision tray.  The cut surface seemed to be best fit with size 3 as the primary component was as well.  I then broached the proximal femur for a 29 cemented sleeve due to the loss of the bone and for a keel rotational stability.  The trial was left in place and attention now directed to femur. Femoral canal was reamed again and 11 mm reamer placed into the canal. There was very little bone that needed to be removed off the distal femur.  However, I probably removed based on this cut just about 2 mm from the original procedure.  Based on the fact that there was very little bone loss, I chose to use the same size component which was a 4 narrow femur, I just was going to use a cemented stem.  We went ahead and placed the trial in with a +2 bolt to keep the anterior phalanx down to help with flexion stability.  The trial was carried out with a 4 narrow femur, 30 mm cemented stem, and a 12.5 insert.  With this, I felt there was a little bit of hyperextension.  At this point, I made the decision to use the final components.  I did decide on medial and lateral distal augments on the femur to help with the extension gap as the flexion appeared to be better or more stable. Final components were opened as noted.  The tibial component was a size 3 MBT revision tray with no augments and no stem.  I did use a 29 mm cemented sleeve.  The femoral canal was size 4 narrow femur with size 2 medial and lateral 4 mm distal augments, a +2 bolt, 5 degrees adapter and a 30 mm cemented stem.  Cement restrictors were placed in the distal femur,  proximal tibia.  The canals were irrigated and bone prepared.  Cement was mixed.  The final components were made on the back table.  Final components were then cemented with gentamicin-impregnated cement. The knee was brought to extension initially with a 12.5 and then a 15 mm insert at which point I felt the knee had no hyperextension.  The knee compressed and extruded cement was removed.  Once the cement had fully cured and excess cement was removed throughout the knee, I chose a 15 mm insert as the final insert.  It was placed in the knee.  The tourniquet had been let down.  There was no  significant hemostasis required.  The knee was re-irrigated with normal saline solution.  A medium Hemovac drain was placed deep.  The extensor mechanism was then reapproximated with the knee in flexion using #1 Vicryl and the remainder of the wound was closed with 2- 0 Vicryl and staples on the skin.  The skin was cleaned, dried and dressed sterilely with a bulky sterile wrap.  She was then brought to recovery room, extubated in stable condition tolerating the procedure well.     [name], M.D.     MDO/MEDQ  D:  09/01/2010  T:  09/01/2010  Job:  [id number]  Electronically Signed by Durene Romans M.D. on 09/07/2010 09:39:48 AM

## 2010-09-09 ENCOUNTER — Other Ambulatory Visit: Payer: Self-pay | Admitting: *Deleted

## 2010-09-09 DIAGNOSIS — F329 Major depressive disorder, single episode, unspecified: Secondary | ICD-10-CM

## 2010-09-09 DIAGNOSIS — F32A Depression, unspecified: Secondary | ICD-10-CM

## 2010-09-09 MED ORDER — SERTRALINE HCL 50 MG PO TABS: 50.0000 mg | ORAL_TABLET | Freq: Every day | ORAL | Status: DC

## 2010-09-25 NOTE — Discharge Summary (Addendum)
[name], [name]               ACCOUNT NO.:  [id number]  MEDICAL RECORD NO.:  [id number]  LOCATION:  1619                         FACILITY:  [facility]  PHYSICIAN:  Madlyn Frankel. Charlann Boxer, M.D.  DATE OF BIRTH:  November 30, 1955  DATE OF ADMISSION:  09/01/2010 DATE OF DISCHARGE:  09/04/2010                              DISCHARGE SUMMARY   PROCEDURE:  Left total knee arthroplasty revision.  ADMITTING DIAGNOSIS:  Failed left total knee arthroplasty.  DISCHARGE DIAGNOSES: 1. Status post revision of left total knee arthroplasty. 2. Rheumatoid arthritis. 3. Hypertension. 4. Hyperlipidemia. 5. Depression.  HISTORY OF PRESENT ILLNESS:  The patient is a 55 year old lady with a history of total knee arthroplasty in 2012 on the left.  The patient subsequently had aseptic loosening, causing her significant pain.  Labs have been negative for infection, but because of continued pain, she had a bone scan which was positive.  Due to continued symptoms, various options were discussed with the patient.  She wishes to proceed with revision of a total knee arthroplasty on the left.  Risks, benefits, expectations and after care were discussed with the patient.  The patient understands the risks, benefits, expectations, and after care and wishes to proceed.  The patient's questions have been invited and answered.  The patient is a candidate for tranexamic acid and received it prior to the surgery.  The patient also given her home medications at the time of H and P.  HOSPITAL COURSE:  The patient underwent the above stated procedure on September 01, 2010.  The patient tolerated the procedure well, was brought to the recovery room in good condition and subsequently to the floor.  On postoperative day 1, September 02, 2010, the patient was doing okay, afebrile, vital signs are stable, hematocrit was 31.8, Hemovac was taken out.  The IV was switched to saline lock.  DVT prophylaxis was started and the patient will be seen  by PT.  September 03, 2010, postoperative day 2, the patient had some difficulties over the night with pain, otherwise she is afebrile, vital signs stable, hematocrit 34.9.  Left knee Ace was dry.  Dressing was clean, dry, and intact.  The patient will be seen by PT.  Dressing was changed.  On postoperative day 3, September 04, 2010, the patient was doing better, she was okay, however, she was ready to go home.  She was afebrile, vital signs stable.  The patient will seen by PT/OT.  The patient felt that she was well enough to go home and be discharged from the hospital.  DISCHARGE INSTRUCTIONS:  The patient will be discharged from the hospital on September 04, 2010.  The patient is to maintain her surgical dressing for 8 days and replace this with gauze and tape.  The patient is to keep the area dry and clean until followup.  The patient will follow up in 2 weeks with Dr. Charlann Boxer at [facility].  The patient is to call if any concerns.  The patient was also discharged on a CPM machine 0-60, 4-6 hours a day, increase 10 degrees as tolerated.  DISCHARGE MEDICATIONS: 1. Folic acid 1 mg 1 p.o. q.a.m. 2. Zoloft 50 mg 1 p.o.  q.a.m. 3. Tylenol 650 mg 2 p.o. q.8 h. p.r.n. 4. Aspirin 81 mg 1 p.o. q.a.m. 5. Ambien 10 mg 1 p.o. q.h.s. 6. Zocor 20 mg 1 p.o. q.h.s. 7. Omeprazole 20 mg 1 p.o. b.i.d. 8. Lisinopril 20 mg 1 p.o. q.a.m. 9. Yeast rice 1 p.o. q.a.m. 10.Enbrel 50 mg 1 injection subcutaneously on Fridays. 11.Methotrexate 2.5 mg 1 injection IM on Fridays. 12.Prednisone 5 mg 1 p.o. q.a.m. 13.Oxycodone IR 5 mg 1-2 p.o. q.4-6 h. p.r.n. pain.    ______________________________ Lanney Gins, PA   ______________________________ Madlyn Frankel. Charlann Boxer, M.D.    MB/MEDQ  D:  09/23/2010  T:  09/24/2010  Job:  [id number]  Electronically Signed by Lanney Gins PA on 09/25/2010 10:27:43 AM Electronically Signed by Durene Romans M.D. on 09/26/2010 09:23:55 AM

## 2010-11-02 ENCOUNTER — Telehealth: Payer: Self-pay | Admitting: *Deleted

## 2010-11-02 NOTE — Telephone Encounter (Signed)
Refill request for Hydrocodone acetaminophen from [facility].   I called pt explaining we do not order controlled meds throught mail order.  Pt stated she cannot take this med, makes her stomach upset.  Pt takes oxycontin for pain.  I will remove from med list.

## 2010-11-19 ENCOUNTER — Other Ambulatory Visit: Payer: Self-pay | Admitting: Family Medicine

## 2010-11-19 NOTE — Telephone Encounter (Signed)
May refill once. 

## 2010-11-19 NOTE — Telephone Encounter (Signed)
Last filled 06-02-2010, #90 with 1 refill

## 2010-11-19 NOTE — Telephone Encounter (Signed)
Refill once 

## 2010-11-19 NOTE — Telephone Encounter (Signed)
Will fax RX 

## 2010-11-23 ENCOUNTER — Telehealth: Payer: Self-pay | Admitting: Family Medicine

## 2010-11-23 HISTORY — PX: JOINT REPLACEMENT: SHX530

## 2010-11-23 NOTE — Telephone Encounter (Signed)
Pt informed this refill was already done on 9/27 last week on VM

## 2010-11-23 NOTE — Telephone Encounter (Signed)
Pt requesting refill on zolpidem (AMBIEN) 10 MG tablet  Please send to [facility] Fax: [phone]

## 2010-12-05 ENCOUNTER — Other Ambulatory Visit: Payer: Self-pay | Admitting: Family Medicine

## 2010-12-07 ENCOUNTER — Telehealth: Payer: Self-pay | Admitting: *Deleted

## 2010-12-07 NOTE — Telephone Encounter (Signed)
Pt requesting refill Ambien.  Pt states last filled on 09-23-2010 #90 with 0 refills.  Our records show  On 11/19/10 #90 with 1 refill. [facility] number is [phone]

## 2010-12-07 NOTE — Telephone Encounter (Signed)
I did call [facility], they confirmed they last filled Ambien on 09-21-10 #90 with 0 refills? [facility] fax is [phone]

## 2010-12-08 ENCOUNTER — Telehealth: Payer: Self-pay | Admitting: Family Medicine

## 2010-12-08 ENCOUNTER — Other Ambulatory Visit: Payer: Self-pay | Admitting: Family Medicine

## 2010-12-08 DIAGNOSIS — N6452 Nipple discharge: Secondary | ICD-10-CM

## 2010-12-08 MED ORDER — ZOLPIDEM TARTRATE 10 MG PO TABS: 10.0000 mg | ORAL_TABLET | Freq: Every evening | ORAL | Status: DC | PRN

## 2010-12-08 NOTE — Telephone Encounter (Signed)
OK to refill for 3 months. 

## 2010-12-08 NOTE — Telephone Encounter (Signed)
Go ahead and order diagnostic mammogram.

## 2010-12-08 NOTE — Telephone Encounter (Signed)
Pt informed Rx will be faxed for 90 days to [facility]

## 2010-12-08 NOTE — Telephone Encounter (Signed)
Patient called [facility] to schedule her yearly mammogram. She had told them that she was having some disharge in her left breast and was instructed to call here to get an order for a diagnostic mammogram, because of her discharge. their phone number is [phone]. Thanks.

## 2010-12-21 ENCOUNTER — Other Ambulatory Visit: Payer: Self-pay | Admitting: Family Medicine

## 2010-12-21 ENCOUNTER — Ambulatory Visit
Admission: RE | Admit: 2010-12-21 | Discharge: 2010-12-21 | Disposition: A | Discharge status: Home / Self Care | Payer: Medicare Other | Source: Ambulatory Visit | Attending: Family Medicine | Admitting: Family Medicine

## 2010-12-21 DIAGNOSIS — N6452 Nipple discharge: Secondary | ICD-10-CM

## 2010-12-28 ENCOUNTER — Ambulatory Visit
Admission: RE | Admit: 2010-12-28 | Discharge: 2010-12-28 | Disposition: A | Discharge status: Home / Self Care | Payer: Medicare Other | Source: Ambulatory Visit | Attending: Family Medicine | Admitting: Family Medicine

## 2010-12-28 DIAGNOSIS — N6452 Nipple discharge: Secondary | ICD-10-CM

## 2011-01-01 ENCOUNTER — Ambulatory Visit (INDEPENDENT_AMBULATORY_CARE_PROVIDER_SITE_OTHER): Payer: Medicare Other | Admitting: Family Medicine

## 2011-01-01 ENCOUNTER — Encounter: Payer: Self-pay | Admitting: Family Medicine

## 2011-01-01 VITALS — BP 120/62 | Temp 98.1°F | Wt 266.0 lb

## 2011-01-01 DIAGNOSIS — J329 Chronic sinusitis, unspecified: Secondary | ICD-10-CM

## 2011-01-01 MED ORDER — LEVOFLOXACIN 500 MG PO TABS: 500.0000 mg | ORAL_TABLET | Freq: Every day | ORAL | Status: AC

## 2011-01-01 NOTE — Progress Notes (Signed)
  Subjective:    Patient ID: [name], female    DOB: [birth year], 55 y.o.   MRN: [id number]  HPI Acute visit. Patient had left sphenoid sinus surgery several months ago. Was doing well until about 4 or 5 days ago. She's had similar pain deep left sphenoid region. She's had some cough but no fever. She has some yellow nasal discharge. Denies sore throat.  She is immunocompromised on Enbrel and methotrexate. No chills. No nausea or vomiting. Saline nose irrigation without much improvement   Review of Systems  Constitutional: Positive for fatigue. Negative for fever and chills.  HENT: Positive for congestion and sinus pressure.   Respiratory: Positive for cough. Negative for shortness of breath.   Neurological: Positive for headaches.       Objective:   Physical Exam  Constitutional: She is oriented to person, place, and time. She appears well-developed and well-nourished. No distress.  HENT:  Right Ear: External ear normal.  Left Ear: External ear normal.  Nose: Nose normal.  Mouth/Throat: Oropharynx is clear and moist.  Neck: Neck supple.  Cardiovascular: Normal rate and regular rhythm.   Pulmonary/Chest: Effort normal and breath sounds normal. No respiratory distress. She has no wheezes. She has no rales.  Musculoskeletal: She exhibits no edema.  Lymphadenopathy:    She has no cervical adenopathy.  Neurological: She is alert and oriented to person, place, and time.          Assessment & Plan:  Acute sinusitis. Given the localized nature of pain and history of left sphenoid sinusitis start Levaquin 500 milligrams daily for 10 days. Consider ENT referral or repeat CT maxillofacial and 2 weeks if no better

## 2011-01-06 ENCOUNTER — Encounter (INDEPENDENT_AMBULATORY_CARE_PROVIDER_SITE_OTHER): Payer: Self-pay | Admitting: Surgery

## 2011-01-06 ENCOUNTER — Ambulatory Visit (INDEPENDENT_AMBULATORY_CARE_PROVIDER_SITE_OTHER): Payer: Medicare Other | Admitting: Surgery

## 2011-01-06 VITALS — BP 110/70 | HR 78 | Temp 97.0°F | Resp 18 | Ht 64.0 in | Wt 260.4 lb

## 2011-01-06 DIAGNOSIS — N6452 Nipple discharge: Secondary | ICD-10-CM | POA: Insufficient documentation

## 2011-01-06 DIAGNOSIS — N6459 Other signs and symptoms in breast: Secondary | ICD-10-CM

## 2011-01-06 NOTE — Patient Instructions (Signed)
Follow up in 6 months.  If discharge becomes bloody,  Please return to office for a check.

## 2011-01-06 NOTE — Progress Notes (Signed)
Chief Complaint  Patient presents with  . Breast Discharge    HPI [name] is a 55 y.o. female.   HPI The patient is sent today at the request of Dr. Deboraha Sprang for left breast nipple discharge. It has been present for a couple of months. She denies any breast mass. The discharge is clear. It is nonbloody. It is intermittent. It is mild in quantity. No right nipple discharge noted.  Past Medical History  Diagnosis Date  . ANGIOMA 10/03/2008  . HYPERLIPIDEMIA 08/27/2008  . DEPRESSION 12/10/2009  . HYPERTENSION 08/27/2008  . GERD 08/27/2008  . SEBORRHEIC KERATOSIS 08/27/2008    Past Surgical History  Procedure Date  . Cesarean section 1980  . Cesarean section 1982  . Cholecystectomy 1997  . Abdominal hysterectomy 1995  . Foot surgery 2004, 2005, 2008  . Knee arthroscopy 2007, 2208, 2009    Family History  Problem Relation Age of Onset  . Cancer Mother     breast  . Cancer Father     prostate  . Diabetes Other     grandparent  . Heart disease Other     parent  . Kidney disease Other     parent  . Cancer Other     parent  . Hypertension Other   . Hyperlipidemia Other     Social History History  Substance Use Topics  . Smoking status: Former Smoker -- 0.5 packs/day for 5 years    Types: Cigarettes    Quit date: 02/22/1990  . Smokeless tobacco: Not on file  . Alcohol Use: No    Allergies  Allergen Reactions  . Codeine Sulfate     REACTION: GI upset  . Penicillins     REACTION: rash    Current Outpatient Prescriptions  Medication Sig Dispense Refill  . acetaminophen (TYLENOL ARTHRITIS PAIN) 650 MG CR tablet Take 650 mg by mouth every 8 (eight) hours as needed.        Marland Kitchen aspirin 81 MG tablet Take 81 mg by mouth daily.        Marland Kitchen estradiol (ESTRACE) 1 MG tablet Take 1 tablet (1 mg total) by mouth daily.  90 tablet  3  . etanercept (ENBREL) 50 MG/ML injection Inject 50 mg into the skin once a week.        . fluticasone (FLONASE) 50 MCG/ACT nasal spray 2 sprays by  Nasal route daily.  16 g  11  . folic acid (FOLVITE) 1 MG tablet Take 1 mg by mouth daily.        . hydrochlorothiazide 25 MG tablet Take 1 tablet (25 mg total) by mouth daily.  90 tablet  3  . levofloxacin (LEVAQUIN) 500 MG tablet Take 1 tablet (500 mg total) by mouth daily.  10 tablet  0  . lisinopril (PRINIVIL,ZESTRIL) 20 MG tablet Take 1 tablet (20 mg total) by mouth daily.  90 tablet  3  . methocarbamol (ROBAXIN) 500 MG tablet Take 500 mg by mouth as needed.       . methotrexate 2.5 MG tablet Weekly injection per Dr. Dierdre Forth       . omeprazole (PRILOSEC) 20 MG capsule Take 1 capsule (20 mg total) by mouth 2 (two) times daily.  180 capsule  3  . oxyCODONE-acetaminophen (PERCOCET) 5-325 MG per tablet Take 1 tablet by mouth every 6 (six) hours as needed.        . predniSONE (DELTASONE) 5 MG tablet Take 5 mg by mouth daily.        Marland Kitchen  sertraline (ZOLOFT) 50 MG tablet Take 1 tablet (50 mg total) by mouth daily.  90 tablet  2  . simvastatin (ZOCOR) 20 MG tablet Take 1 tablet (20 mg total) by mouth at bedtime.  90 tablet  3  . zolpidem (AMBIEN) 10 MG tablet Take 1 tablet (10 mg total) by mouth at bedtime as needed for sleep.  90 tablet  0    Review of Systems Review of Systems  Constitutional: Negative for fever, chills and unexpected weight change.  HENT: Negative for hearing loss, congestion, sore throat, trouble swallowing and voice change.   Eyes: Negative for visual disturbance.  Respiratory: Negative for cough and wheezing.   Cardiovascular: Negative for chest pain, palpitations and leg swelling.  Gastrointestinal: Negative for nausea, vomiting, abdominal pain, diarrhea, constipation, blood in stool, abdominal distention and anal bleeding.  Genitourinary: Negative for hematuria, vaginal bleeding and difficulty urinating.  Musculoskeletal: Negative for arthralgias.  Skin: Negative for rash and wound.  Neurological: Negative for seizures, syncope and headaches.  Hematological: Negative for  adenopathy. Does not bruise/bleed easily.  Psychiatric/Behavioral: Negative for confusion.    Blood pressure 110/70, pulse 78, temperature 97 F (36.1 C), temperature source Temporal, resp. rate 18, height 5\' 4"  (1.626 m), weight 260 lb 6 oz (118.105 kg).  Physical Exam Physical Exam  Constitutional: She is oriented to person, place, and time. She appears well-developed and well-nourished.  HENT:  Head: Normocephalic and atraumatic.  Nose: Nose normal.  Eyes: EOM are normal. Pupils are equal, round, and reactive to light.  Neck: Normal range of motion. Neck supple.  Cardiovascular: Normal rate, regular rhythm and normal heart sounds.  Exam reveals no gallop and no friction rub.   No murmur heard. Pulmonary/Chest: Effort normal and breath sounds normal.       Normal breast exam.  No discharge from either nipple.  Abdominal: Soft. Bowel sounds are normal.  Musculoskeletal: Normal range of motion.  Neurological: She is alert and oriented to person, place, and time.  Skin: Skin is warm and dry.  Psychiatric: She has a normal mood and affect. Her behavior is normal. Judgment and thought content normal.    Data Reviewed Mammogram normal Ductogram not possible.  Assessment    Left breast nipple discharge clear    Plan    I discussed with the patient options. The drainage is clear which is a benign drainage. I do not think duct excision in this setting would be helpful since I do not have a specific target or duct to excise. I recommend follow up in 6 months. If the drainage becomes bloody or heavy she will contact me sooner.       Jameila Keeny A. 01/06/2011, 4:34 PM

## 2011-01-22 ENCOUNTER — Encounter: Payer: Self-pay | Admitting: Family Medicine

## 2011-01-22 ENCOUNTER — Ambulatory Visit (INDEPENDENT_AMBULATORY_CARE_PROVIDER_SITE_OTHER): Payer: Medicare Other | Admitting: Family Medicine

## 2011-01-22 VITALS — BP 102/58 | Temp 98.0°F | Wt 264.0 lb

## 2011-01-22 DIAGNOSIS — R519 Headache, unspecified: Secondary | ICD-10-CM

## 2011-01-22 DIAGNOSIS — J329 Chronic sinusitis, unspecified: Secondary | ICD-10-CM

## 2011-01-22 MED ORDER — CHLORPHENIRAMINE-HYDROCODONE 8-10 MG/5ML PO LQCR: 5.0000 mL | Freq: Two times a day (BID) | ORAL | Status: DC | PRN

## 2011-01-22 MED ORDER — LEVOFLOXACIN 500 MG PO TABS: 500.0000 mg | ORAL_TABLET | Freq: Every day | ORAL | Status: AC

## 2011-01-22 NOTE — Progress Notes (Signed)
  Subjective:    Patient ID: [name], female    DOB: 1955/04/28, 55 y.o.   MRN: [id number]  HPI  Persistent/recurrent headache and sinusitis symptoms. She's had some persistent greenish nasal discharge. History of surgery for sphenoid sinusitis several months ago. Presented a couple weeks ago with similar symptoms and treated with Levaquin 500 milligrams daily for 10 days. Initially somewhat better now symptoms are back in full force. She has nonproductive cough. Intermittent diffuse headaches. No nausea or vomiting. No documented fever. Extreme malaise. Feels very similar to the way she did before her initial surgery.   Review of Systems  Constitutional: Positive for fatigue. Negative for fever, chills, appetite change and unexpected weight change.  Respiratory: Positive for cough. Negative for shortness of breath and wheezing.   Cardiovascular: Negative for chest pain.  Neurological: Positive for headaches. Negative for seizures, syncope and weakness.       Objective:   Physical Exam  Constitutional: She appears well-developed and well-nourished. No distress.  HENT:  Right Ear: External ear normal.  Left Ear: External ear normal.  Nose: Nose normal.  Mouth/Throat: Oropharynx is clear and moist.  Eyes: Pupils are equal, round, and reactive to light.  Neck: Neck supple.  Cardiovascular: Normal rate and regular rhythm.   Pulmonary/Chest: Effort normal and breath sounds normal. No respiratory distress. She has no wheezes. She has no rales.          Assessment & Plan:  Acute/chronic sinusitis. Recurrent headaches. Repeat maxillofacial CT scan. Start back Levaquin 500 mg once daily. Tussionex as needed for nighttime cough. Referral back to ENT depending on the CT results

## 2011-01-22 NOTE — Patient Instructions (Signed)
Follow up immediately for any fever or worsening symptoms. 

## 2011-01-26 ENCOUNTER — Ambulatory Visit (INDEPENDENT_AMBULATORY_CARE_PROVIDER_SITE_OTHER)
Admission: RE | Admit: 2011-01-26 | Discharge: 2011-01-26 | Disposition: A | Discharge status: Home / Self Care | Payer: Medicare Other | Source: Ambulatory Visit | Attending: Family Medicine | Admitting: Family Medicine

## 2011-01-26 DIAGNOSIS — J329 Chronic sinusitis, unspecified: Secondary | ICD-10-CM

## 2011-01-26 DIAGNOSIS — R51 Headache: Secondary | ICD-10-CM

## 2011-01-26 DIAGNOSIS — R519 Headache, unspecified: Secondary | ICD-10-CM

## 2011-01-27 NOTE — Progress Notes (Signed)
Quick Note:  Pt informed and she voiced her understanding ______ 

## 2011-02-23 HISTORY — PX: NASAL SINUS SURGERY: SHX719

## 2011-02-23 HISTORY — PX: BREAST BIOPSY: SHX20

## 2011-02-24 DIAGNOSIS — M159 Polyosteoarthritis, unspecified: Secondary | ICD-10-CM | POA: Diagnosis not present

## 2011-02-24 DIAGNOSIS — Z79899 Other long term (current) drug therapy: Secondary | ICD-10-CM | POA: Diagnosis not present

## 2011-02-24 DIAGNOSIS — IMO0001 Reserved for inherently not codable concepts without codable children: Secondary | ICD-10-CM | POA: Diagnosis not present

## 2011-02-24 DIAGNOSIS — M069 Rheumatoid arthritis, unspecified: Secondary | ICD-10-CM | POA: Diagnosis not present

## 2011-03-12 ENCOUNTER — Other Ambulatory Visit: Payer: Self-pay | Admitting: Family Medicine

## 2011-03-12 DIAGNOSIS — T84099A Other mechanical complication of unspecified internal joint prosthesis, initial encounter: Secondary | ICD-10-CM | POA: Diagnosis not present

## 2011-03-12 DIAGNOSIS — M5430 Sciatica, unspecified side: Secondary | ICD-10-CM | POA: Diagnosis not present

## 2011-03-12 NOTE — Telephone Encounter (Signed)
Last filled 12-08-10, #90 with 0 refills

## 2011-03-14 NOTE — Telephone Encounter (Signed)
Refill once 

## 2011-04-07 DIAGNOSIS — M069 Rheumatoid arthritis, unspecified: Secondary | ICD-10-CM | POA: Diagnosis not present

## 2011-04-07 DIAGNOSIS — IMO0001 Reserved for inherently not codable concepts without codable children: Secondary | ICD-10-CM | POA: Diagnosis not present

## 2011-04-07 DIAGNOSIS — M159 Polyosteoarthritis, unspecified: Secondary | ICD-10-CM | POA: Diagnosis not present

## 2011-04-13 DIAGNOSIS — M5430 Sciatica, unspecified side: Secondary | ICD-10-CM | POA: Diagnosis not present

## 2011-05-05 ENCOUNTER — Encounter: Payer: Self-pay | Admitting: Family Medicine

## 2011-05-05 ENCOUNTER — Ambulatory Visit (INDEPENDENT_AMBULATORY_CARE_PROVIDER_SITE_OTHER): Payer: Medicare Other | Admitting: Family Medicine

## 2011-05-05 VITALS — BP 120/72 | Temp 97.7°F | Wt 262.0 lb

## 2011-05-05 DIAGNOSIS — J019 Acute sinusitis, unspecified: Secondary | ICD-10-CM

## 2011-05-05 MED ORDER — CEFDINIR 300 MG PO CAPS: 300.0000 mg | ORAL_CAPSULE | Freq: Two times a day (BID) | ORAL | Status: AC

## 2011-05-05 NOTE — Patient Instructions (Signed)

## 2011-05-05 NOTE — Progress Notes (Signed)
  Subjective:    Patient ID: [name], female    DOB: [birth year], 56 y.o.   MRN: [id number]  HPI  Patient seen with recurrent sinusitis symptoms. Progressive over one week. She's had complicated sinus disease in the past with previous sphenoid sinusitis. She had surgery last year for that. Recent CT scan late 2012 per ENT revealed some ethmoid sinusitis but clearing of left sphenoid sinusitis. She has cough, bilateral ear pressure, intermittent sore throat, and low-grade fever over the past several days. Intermittent headaches. Yellowish nasal discharge. Some upper teeth pain.   Review of Systems  Constitutional: Positive for fever and fatigue. Negative for chills.  HENT: Positive for congestion, postnasal drip and sinus pressure.   Respiratory: Positive for cough.   Neurological: Positive for headaches.       Objective:   Physical Exam  Constitutional: She appears well-developed and well-nourished.  HENT:  Right Ear: External ear normal.  Left Ear: External ear normal.  Mouth/Throat: Oropharynx is clear and moist.  Neck: Neck supple. No thyromegaly present.  Pulmonary/Chest: Effort normal and breath sounds normal. No respiratory distress. She has no wheezes. She has no rales.  Lymphadenopathy:    She has no cervical adenopathy.          Assessment & Plan:  Acute sinusitis. Given her prior history of complicated sinus disease start Omnicef 300 mg twice daily for 10 days.

## 2011-05-06 ENCOUNTER — Other Ambulatory Visit: Payer: Self-pay | Admitting: Family Medicine

## 2011-05-12 DIAGNOSIS — T84099A Other mechanical complication of unspecified internal joint prosthesis, initial encounter: Secondary | ICD-10-CM | POA: Diagnosis not present

## 2011-05-12 DIAGNOSIS — M5430 Sciatica, unspecified side: Secondary | ICD-10-CM | POA: Diagnosis not present

## 2011-05-13 DIAGNOSIS — J0120 Acute ethmoidal sinusitis, unspecified: Secondary | ICD-10-CM | POA: Diagnosis not present

## 2011-05-24 ENCOUNTER — Other Ambulatory Visit: Payer: Self-pay | Admitting: Otolaryngology

## 2011-05-24 ENCOUNTER — Ambulatory Visit
Admission: RE | Admit: 2011-05-24 | Discharge: 2011-05-24 | Disposition: A | Discharge status: Home / Self Care | Payer: Medicare Other | Source: Ambulatory Visit | Attending: Otolaryngology | Admitting: Otolaryngology

## 2011-05-24 DIAGNOSIS — J322 Chronic ethmoidal sinusitis: Secondary | ICD-10-CM | POA: Diagnosis not present

## 2011-05-24 DIAGNOSIS — IMO0001 Reserved for inherently not codable concepts without codable children: Secondary | ICD-10-CM

## 2011-05-24 DIAGNOSIS — J329 Chronic sinusitis, unspecified: Secondary | ICD-10-CM | POA: Diagnosis not present

## 2011-05-31 DIAGNOSIS — J322 Chronic ethmoidal sinusitis: Secondary | ICD-10-CM | POA: Diagnosis not present

## 2011-06-01 DIAGNOSIS — Z79899 Other long term (current) drug therapy: Secondary | ICD-10-CM | POA: Diagnosis not present

## 2011-06-01 DIAGNOSIS — R059 Cough, unspecified: Secondary | ICD-10-CM | POA: Diagnosis not present

## 2011-06-01 DIAGNOSIS — J329 Chronic sinusitis, unspecified: Secondary | ICD-10-CM | POA: Diagnosis not present

## 2011-06-01 DIAGNOSIS — R05 Cough: Secondary | ICD-10-CM | POA: Diagnosis not present

## 2011-06-01 DIAGNOSIS — R112 Nausea with vomiting, unspecified: Secondary | ICD-10-CM | POA: Diagnosis not present

## 2011-06-01 DIAGNOSIS — J9819 Other pulmonary collapse: Secondary | ICD-10-CM | POA: Diagnosis not present

## 2011-06-01 DIAGNOSIS — R197 Diarrhea, unspecified: Secondary | ICD-10-CM | POA: Diagnosis not present

## 2011-06-01 DIAGNOSIS — I10 Essential (primary) hypertension: Secondary | ICD-10-CM | POA: Diagnosis not present

## 2011-06-01 DIAGNOSIS — J209 Acute bronchitis, unspecified: Secondary | ICD-10-CM | POA: Diagnosis not present

## 2011-06-01 DIAGNOSIS — R6889 Other general symptoms and signs: Secondary | ICD-10-CM | POA: Diagnosis not present

## 2011-06-01 DIAGNOSIS — J019 Acute sinusitis, unspecified: Secondary | ICD-10-CM | POA: Diagnosis not present

## 2011-06-01 DIAGNOSIS — Z7982 Long term (current) use of aspirin: Secondary | ICD-10-CM | POA: Diagnosis not present

## 2011-06-03 ENCOUNTER — Encounter: Payer: Self-pay | Admitting: Family Medicine

## 2011-06-03 ENCOUNTER — Ambulatory Visit (INDEPENDENT_AMBULATORY_CARE_PROVIDER_SITE_OTHER): Payer: Medicare Other | Admitting: Family Medicine

## 2011-06-03 ENCOUNTER — Telehealth: Payer: Self-pay | Admitting: Family Medicine

## 2011-06-03 VITALS — BP 120/80 | HR 82 | Temp 98.9°F | Resp 14

## 2011-06-03 DIAGNOSIS — R05 Cough: Secondary | ICD-10-CM | POA: Diagnosis not present

## 2011-06-03 DIAGNOSIS — J320 Chronic maxillary sinusitis: Secondary | ICD-10-CM

## 2011-06-03 DIAGNOSIS — R110 Nausea: Secondary | ICD-10-CM

## 2011-06-03 DIAGNOSIS — R059 Cough, unspecified: Secondary | ICD-10-CM | POA: Diagnosis not present

## 2011-06-03 MED ORDER — PROMETHAZINE HCL 50 MG/ML IJ SOLN
25.0000 mg | Freq: Once | INTRAMUSCULAR | Status: AC
  Administered 2011-06-03: 25 mg via INTRAMUSCULAR

## 2011-06-03 MED ORDER — ONDANSETRON 8 MG PO TBDP: 8.0000 mg | ORAL_TABLET | Freq: Three times a day (TID) | ORAL | Status: AC | PRN

## 2011-06-03 NOTE — Patient Instructions (Signed)
Hold Biaxin until you talk with Dr Ezzard Standing tomorrow.

## 2011-06-03 NOTE — Telephone Encounter (Signed)
Pt was diagnosed with Bronchitis and has a few questions she would like to ask you

## 2011-06-03 NOTE — Telephone Encounter (Signed)
I called pt as requested, she has laryngitis and [facility] in [place] diagnosed her with acute bronchitis.  They put her on and an inhaller and instructed her to finish the Biaxin Dr Ezzard Standing put her on.  She saw Dr Ezzard Standing for her sinusitis issues and is supposed to call him back tomorrow to update him on how she is feeling.  Pt is still running a low grade fever 99 to 100 and tylenol is keeping that down every 8 hours.  Pt is concerned about coughing [place] green mucous and blood.  It was decided she needed to be seen. Pt will come for eval at 4:30 pm this afternoon.

## 2011-06-03 NOTE — Progress Notes (Signed)
Subjective:    Patient ID: [name], female    DOB: [birth year], 56 y.o.   MRN: [id number]  HPI  Patient seen with some nausea and vomiting for the past few days. She's had occasional mild diarrhea. Her history is that she's had recurrent sinus disease most recently right maxillary sinusitis per CT scan. She was placed on Biaxin about 8 days ago and has only 2 days remaining. Possible surgery being discussed for right maxillary sinus. She denies any fever. She's had occasional cough with some possible intermittent wheezing. She's had a couple episodes of vomiting today and nausea. She is taking hydrocodone cough syrup intermittently for cough. She's not any significant dyspnea. No history of asthma. Nonsmoker. One episode of coughing some blood-tinged mucus yesterday but none today. Denies pleuritic pain. She's had laryngitis for the past few days.  No dysuria and no significant abdominal pain.  Past Medical History  Diagnosis Date  . ANGIOMA 10/03/2008  . HYPERLIPIDEMIA 08/27/2008  . DEPRESSION 12/10/2009  . HYPERTENSION 08/27/2008  . GERD 08/27/2008  . SEBORRHEIC KERATOSIS 08/27/2008   Past Surgical History  Procedure Date  . Cesarean section 1980  . Cesarean section 1982  . Cholecystectomy 1997  . Abdominal hysterectomy 1995  . Foot surgery 2004, 2005, 2008  . Knee arthroscopy 2007, 2208, 2009    reports that she quit smoking about 21 years ago. Her smoking use included Cigarettes. She has a 2.5 pack-year smoking history. She does not have any smokeless tobacco history on file. She reports that she does not drink alcohol or use illicit drugs. family history includes Cancer in her father, mother, and other; Diabetes in her other; Heart disease in her other; Hyperlipidemia in her other; Hypertension in her other; and Kidney disease in her other. Allergies  Allergen Reactions  . Codeine Sulfate     REACTION: GI upset  . Penicillins     REACTION: rash     Review of Systems    Constitutional: Positive for fatigue. Negative for fever and chills.  HENT: Positive for congestion, voice change and sinus pressure.   Respiratory: Positive for cough and wheezing. Negative for shortness of breath.   Cardiovascular: Negative for chest pain, palpitations and leg swelling.  Gastrointestinal: Positive for nausea and vomiting. Negative for abdominal pain and blood in stool.  Genitourinary: Negative for dysuria.  Neurological: Positive for headaches. Negative for syncope.  Hematological: Negative for adenopathy.       Objective:   Physical Exam  Constitutional: She is oriented to person, place, and time. She appears well-developed and well-nourished.  HENT:  Right Ear: External ear normal.  Left Ear: External ear normal.  Mouth/Throat: Oropharynx is clear and moist.  Neck: Neck supple.  Cardiovascular: Normal rate and regular rhythm.   Pulmonary/Chest: Effort normal and breath sounds normal. No respiratory distress. She has no wheezes. She has no rales.  Abdominal: Soft. She exhibits no mass. There is no tenderness.  Lymphadenopathy:    She has no cervical adenopathy.  Neurological: She is alert and oriented to person, place, and time. No cranial nerve deficit.          Assessment & Plan:  Patient presents with several symptoms including nausea, occasional vomiting, and productive cough. No fever or other clinical indicators of infection such as pneumonia. She has right maxillary sinusitis per recent CT scan. Differential for nausea includes related to Biaxin, hydrocodone from her cough syrup, ? related to her sinus disease, or viral process. Phenergan 25 mg IM  given which helped her symptoms. Prescription for Zofran. Hold Biaxin tonight and touch base with ENT surgeon tomorrow

## 2011-06-08 DIAGNOSIS — J322 Chronic ethmoidal sinusitis: Secondary | ICD-10-CM | POA: Diagnosis not present

## 2011-06-11 ENCOUNTER — Other Ambulatory Visit: Payer: Self-pay | Admitting: *Deleted

## 2011-06-11 MED ORDER — ZOLPIDEM TARTRATE 10 MG PO TABS: 10.0000 mg | ORAL_TABLET | Freq: Every evening | ORAL | Status: DC | PRN

## 2011-06-11 NOTE — Telephone Encounter (Signed)
Zolpidem last filled 03-12-11, #90 with 0 refills [facility]

## 2011-06-11 NOTE — Telephone Encounter (Signed)
Rx faxed

## 2011-06-11 NOTE — Telephone Encounter (Signed)
Refill okay?  

## 2011-06-17 DIAGNOSIS — H6520 Chronic serous otitis media, unspecified ear: Secondary | ICD-10-CM | POA: Diagnosis not present

## 2011-06-17 DIAGNOSIS — J322 Chronic ethmoidal sinusitis: Secondary | ICD-10-CM | POA: Diagnosis not present

## 2011-06-17 DIAGNOSIS — J320 Chronic maxillary sinusitis: Secondary | ICD-10-CM | POA: Diagnosis not present

## 2011-06-18 ENCOUNTER — Telehealth: Payer: Self-pay | Admitting: Family Medicine

## 2011-06-18 DIAGNOSIS — F5104 Psychophysiologic insomnia: Secondary | ICD-10-CM

## 2011-06-18 NOTE — Telephone Encounter (Signed)
I received a fax from [facility] to complete a prior auth on this patient's zolpidem, 10mg , 90 for 90. I see she's been on it since before 2008, but I don't see Insomnia noted anywhere. Why is pt taking this med, and do you want her to remain on it? Thanks!

## 2011-06-18 NOTE — Telephone Encounter (Signed)
She has chronic insomnia.  OK to remain on.

## 2011-07-08 ENCOUNTER — Ambulatory Visit (INDEPENDENT_AMBULATORY_CARE_PROVIDER_SITE_OTHER): Payer: BC Managed Care – PPO | Admitting: Surgery

## 2011-07-08 ENCOUNTER — Other Ambulatory Visit (INDEPENDENT_AMBULATORY_CARE_PROVIDER_SITE_OTHER): Payer: Self-pay | Admitting: Surgery

## 2011-07-08 ENCOUNTER — Encounter (INDEPENDENT_AMBULATORY_CARE_PROVIDER_SITE_OTHER): Payer: Self-pay | Admitting: Surgery

## 2011-07-08 VITALS — BP 108/66 | HR 76 | Temp 98.1°F | Resp 16 | Ht 64.0 in | Wt 264.4 lb

## 2011-07-08 DIAGNOSIS — N6452 Nipple discharge: Secondary | ICD-10-CM | POA: Insufficient documentation

## 2011-07-08 DIAGNOSIS — N6459 Other signs and symptoms in breast: Secondary | ICD-10-CM

## 2011-07-08 NOTE — Progress Notes (Signed)
Chief Complaint  Patient presents with  . Re-evaluation    Left Br Drainage/discharge    HPI [name] is a 56 y.o. female.   HPI The patient returns  for left breast nipple discharge. It has been present for a 8   months. She denies any breast mass. The discharge is clear. It is nonbloody. It is intermittent. It is increasing  in quantity. No right nipple discharge noted.  Past Medical History  Diagnosis Date  . ANGIOMA 10/03/2008  . HYPERLIPIDEMIA 08/27/2008  . DEPRESSION 12/10/2009  . HYPERTENSION 08/27/2008  . GERD 08/27/2008  . SEBORRHEIC KERATOSIS 08/27/2008    Past Surgical History  Procedure Date  . Cesarean section 1980  . Cesarean section 1982  . Cholecystectomy 1997  . Abdominal hysterectomy 1995  . Foot surgery 2004, 2005, 2008  . Knee arthroscopy 2007, 2208, 2009  . Nasal sinus surgery 2013    Family History  Problem Relation Age of Onset  . Cancer Mother     breast  . Cancer Father     prostate  . Diabetes Other     grandparent  . Heart disease Other     parent  . Kidney disease Other     parent  . Cancer Other     parent  . Hypertension Other   . Hyperlipidemia Other     Social History History  Substance Use Topics  . Smoking status: Former Smoker -- 0.5 packs/day for 5 years    Types: Cigarettes    Quit date: 02/22/1990  . Smokeless tobacco: Not on file  . Alcohol Use: No    Allergies  Allergen Reactions  . Codeine Sulfate     REACTION: GI upset  . Penicillins     REACTION: rash    Current Outpatient Prescriptions  Medication Sig Dispense Refill  . acetaminophen (TYLENOL ARTHRITIS PAIN) 650 MG CR tablet Take 650 mg by mouth every 8 (eight) hours as needed.        Marland Kitchen aspirin 81 MG tablet Take 81 mg by mouth daily.        . chlorpheniramine-hydrocodone (TUSSIONEX) 8-10 MG/5ML suspension Take 5 mLs by mouth every 12 (twelve) hours as needed for cough.  90 mL  0  . estradiol (ESTRACE) 1 MG tablet TAKE 1 TABLET (1 MG TOTAL) DAILY  90  tablet  3  . etanercept (ENBREL) 50 MG/ML injection Inject 50 mg into the skin once a week.        . fluticasone (FLONASE) 50 MCG/ACT nasal spray USE 2 SPRAYS NASALLY DAILY  48 g  3  . folic acid (FOLVITE) 1 MG tablet Take 1 mg by mouth daily.        . hydrochlorothiazide (HYDRODIURIL) 25 MG tablet TAKE 1 TABLET DAILY  90 tablet  3  . lisinopril (PRINIVIL,ZESTRIL) 20 MG tablet TAKE 1 TABLET DAILY  90 tablet  3  . methotrexate 2.5 MG tablet Weekly injection per Dr. Dierdre Forth       . omeprazole (PRILOSEC) 20 MG capsule TAKE 1 CAPSULE TWICE DAILY  180 capsule  3  . oxyCODONE-acetaminophen (PERCOCET) 5-325 MG per tablet Take 1 tablet by mouth every 6 (six) hours as needed.        . predniSONE (DELTASONE) 5 MG tablet Take 5 mg by mouth daily.        . sertraline (ZOLOFT) 50 MG tablet Take 1 tablet (50 mg total) by mouth daily.  90 tablet  2  . simvastatin (ZOCOR) 20  MG tablet TAKE 1 TABLET (20MG  TOTAL) AT BEDTIME  90 tablet  3  . zolpidem (AMBIEN) 10 MG tablet Take 1 tablet (10 mg total) by mouth at bedtime as needed for sleep.  90 tablet  0    Review of Systems Review of Systems  Constitutional: Negative for fever, chills and unexpected weight change.  HENT: Negative for hearing loss, congestion, sore throat, trouble swallowing and voice change.   Eyes: Negative for visual disturbance.  Respiratory: Negative for cough and wheezing.   Cardiovascular: Negative for chest pain, palpitations and leg swelling.  Gastrointestinal: Negative for nausea, vomiting, abdominal pain, diarrhea, constipation, blood in stool, abdominal distention and anal bleeding.  Genitourinary: Negative for hematuria, vaginal bleeding and difficulty urinating.  Musculoskeletal: Negative for arthralgias.  Skin: Negative for rash and wound.  Neurological: Negative for seizures, syncope and headaches.  Hematological: Negative for adenopathy. Does not bruise/bleed easily.  Psychiatric/Behavioral: Negative for confusion.     Blood pressure 108/66, pulse 76, temperature 98.1 F (36.7 C), temperature source Temporal, resp. rate 16, height 5\' 4"  (1.626 m), weight 264 lb 6.4 oz (119.931 kg).  Physical Exam Physical Exam  Constitutional: She is oriented to person, place, and time. She appears well-developed and well-nourished.  HENT:  Head: Normocephalic and atraumatic.  Nose: Nose normal.  Eyes: EOM are normal. Pupils are equal, round, and reactive to light.  Neck: Normal range of motion. Neck supple.  Cardiovascular: Normal rate, regular rhythm and normal heart sounds.  Exam reveals no gallop and no friction rub.   No murmur heard. Pulmonary/Chest: Effort normal and breath sounds normal.       Normal breast exam.  No discharge from either nipple.  Abdominal: Soft. Bowel sounds are normal.  Musculoskeletal: Normal range of motion.  Neurological: She is alert and oriented to person, place, and time.  Skin: Skin is warm and dry.  Psychiatric: She has a normal mood and affect. Her behavior is normal. Judgment and thought content normal.    Data Reviewed Mammogram normal Ductogram not possible.  Assessment    Left breast nipple discharge clear    Plan    I discussed with the patient options. The drainage is clear which is a benign drainage.      She is tired of the drainage and would like to proceed with central duct excision on the left. The procedure has been discussed with the patient. Alternatives to surgery have been discussed with the patient.  Risks of surgery include bleeding,  Infection,  Nipple loss,   Seroma formation, death,  and the need for further surgery.   The patient understands and wishes to proceed.   Rya Rausch A. 07/08/2011, 2:42 PM

## 2011-07-08 NOTE — Patient Instructions (Signed)
Breast Biopsy WHY YOU NEED A BIOPSY Your caregiver has recommended that you have a breast tissue sample taken (biopsy). This is done to be certain that the lump or abnormality found in your breast is not cancerous (malignant). During a biopsy, a small piece of tissue is removed, so it can be examined under a microscope by a specialist (pathologist) who looks at tissues and cells and diagnoses abnormalities in them. Most lumps (tumors) or abnormalities, on or in the breast, are not cancerous (benign). However, biopsies are taken when your caregiver cannot be absolutely certain of what is wrong only from doing a physical exam, mammogram (breast X-ray), or other studies. A breast biopsy can tell you whether nothing more needs to be done, or you need more surgery or another type of treatment. A biopsy is done when there is:  Any undiagnosed breast mass.   Nipple abnormalities, dimpling, crusting, or ulcerations.   Calcium deposits (calcifications) or abnormalities seen on your mammogram, ultrasound, or MRI.   Suspicious changes in the breast (thickening, asymmetry) seen on mammogram.   Abnormal discharge from the nipple, especially blood.   Redness, swelling, and pain of the breast.  HOW A BIOPSY IS PERFORMED A biopsy is often performed on an outpatient basis (you go home the same day). This can be done in a hospital, clinic, or surgical center. Tissue samples (biopsies) are often done under local anesthesia (area is numbed). Sometimes general anesthetics are required, in which case you sleep through the procedure. Biopsies may remove the entire lump, a small piece of the lump, or a small sliver of tissue removed by needle. TYPES OF BREAST BIOPSY  Fine needle aspiration. A thin needle is placed through the skin, to the lump or cyst, and cells are removed.   Core needle biopsy. A large needle with a special tip is placed through the skin, to the abnormality, and a piece of tissue is removed.    Stereotactic biopsy. A core needle with a special X-ray is used, to direct the needle to the lump or abnormal area, which is difficult to feel or cannot be felt.   Vacuum-assisted biopsy. A hollow probe and a gentle vacuum remove a sample of tissue.   Ultrasound guided core needle biopsy. You lie on your stomach, with your breast through an opening, and a high frequency ultrasound helps guide the needle to the area of the abnormality.   Open biopsy. An incision is made in the breast, and a piece of the lump or the whole lump is removed.  LET YOUR CAREGIVER KNOW ABOUT:  Allergies.   Medicines taken, including herbs, eye drops, over-the-counter medicines, and creams.   Use of steroids (by mouth or creams).   Previous problems with anesthetics or Novocaine.   If you are taking aspirin or blood thinners.   Possibility of pregnancy, if this applies.   History of blood clots (thrombophlebitis).   History of bleeding or blood problems.   Previous surgery.   Other health problems.  RISKS AND COMPLICATIONS   Bleeding.   Infection.   Allergy to medicines.   Bruising and swelling of the breast.   Alteration in the shape of the breast.   Not finding the lump or abnormality.   Needing more surgery.  BEFORE THE PROCEDURE  You should arrive 60 minutes prior to your procedure or as directed.   Check-in at the admissions desk, to fill out necessary forms, if you are not preregistered.   There will be consent forms   to sign, prior to the procedure.   There is a waiting area for your family, while you are having your biopsy.   Try to have someone with you, to drive you home.   Do not smoke for 2 weeks before the surgery.   Let your caregiver know if you develop a cold or an infection.   Do not drink alcohol for at least 24 hours before surgery.   Wear a good support bra to the surgery.  AFTER THE PROCEDURE  After surgery, you will be taken to the recovery area, where a  nurse will watch and check your progress. Once you are awake, stable, and taking fluids well, if there are no other problems, you will be allowed to go home.   Ice packs applied to your operative site may help with discomfort and keep the swelling down.   You may resume normal diet and activities as directed. Avoid strenuous activities affecting the arm on the side of the biopsy, such as tennis, swimming, heavy lifting (more than 10 pounds) or pulling.   Bruising in the breast is normal following this procedure.   Wearing a support bra, even to bed, may be more comfortable. The bra will also help keep the dressing on.   Change dressings as directed.   Your doctor may apply a pressure dressing on your breast for 24 to 48 hours.   Only take over-the-counter or prescription medicines for pain, discomfort, or fever as directed by your caregiver.   Do not take aspirin, because it can cause bleeding.  HOME CARE INSTRUCTIONS   You may resume your usual diet.   Have someone drive you home after the surgery.   Do not do any exercise, driving, lifting or general activities without your caregiver's permission.   Take medicines and over-the-counter medicines, as ordered by your caregiver.   Keep your postoperative appointments as recommended.   Do not drink alcohol while taking pain medicine.  Finding out the results of your test Not all test results are available during your visit. If your test results are not back during the visit, make an appointment with your caregiver to find out the results. Do not assume everything is normal if you have not heard from your caregiver or the medical facility. It is important for you to follow up on all of your test results.  SEEK MEDICAL CARE IF:   You notice redness, swelling, or increasing pain in the wound.   You notice a bad smell coming from the wound or dressing.   You develop a rash.   You need stronger pain medicine.   You are having an  allergic reaction or problems with your medicines.  SEEK IMMEDIATE MEDICAL CARE IF:   You have difficulty breathing.   You have a fever.   There is increased bleeding (more than a small spot) from the wound.   Pus is coming from the wound.   The wound is breaking open.  Document Released: 02/08/2005 Document Revised: 01/28/2011 Document Reviewed: 12/27/2008 [employer] Patient Information 2012 [employer]. 

## 2011-07-29 DIAGNOSIS — R059 Cough, unspecified: Secondary | ICD-10-CM | POA: Diagnosis not present

## 2011-07-29 DIAGNOSIS — H1045 Other chronic allergic conjunctivitis: Secondary | ICD-10-CM | POA: Diagnosis not present

## 2011-07-29 DIAGNOSIS — K219 Gastro-esophageal reflux disease without esophagitis: Secondary | ICD-10-CM | POA: Diagnosis not present

## 2011-07-29 DIAGNOSIS — J310 Chronic rhinitis: Secondary | ICD-10-CM | POA: Diagnosis not present

## 2011-07-29 DIAGNOSIS — R05 Cough: Secondary | ICD-10-CM | POA: Diagnosis not present

## 2011-08-03 ENCOUNTER — Ambulatory Visit
Admission: RE | Admit: 2011-08-03 | Discharge: 2011-08-03 | Disposition: A | Discharge status: Home / Self Care | Payer: BC Managed Care – PPO | Source: Ambulatory Visit | Attending: Allergy and Immunology | Admitting: Allergy and Immunology

## 2011-08-03 ENCOUNTER — Other Ambulatory Visit: Payer: Self-pay | Admitting: Allergy and Immunology

## 2011-08-03 DIAGNOSIS — R05 Cough: Secondary | ICD-10-CM

## 2011-08-03 DIAGNOSIS — R059 Cough, unspecified: Secondary | ICD-10-CM

## 2011-08-05 DIAGNOSIS — M069 Rheumatoid arthritis, unspecified: Secondary | ICD-10-CM | POA: Diagnosis not present

## 2011-08-05 DIAGNOSIS — M159 Polyosteoarthritis, unspecified: Secondary | ICD-10-CM | POA: Diagnosis not present

## 2011-08-05 DIAGNOSIS — IMO0001 Reserved for inherently not codable concepts without codable children: Secondary | ICD-10-CM | POA: Diagnosis not present

## 2011-08-05 DIAGNOSIS — M25559 Pain in unspecified hip: Secondary | ICD-10-CM | POA: Diagnosis not present

## 2011-08-05 DIAGNOSIS — J310 Chronic rhinitis: Secondary | ICD-10-CM | POA: Diagnosis not present

## 2011-08-12 ENCOUNTER — Encounter (HOSPITAL_BASED_OUTPATIENT_CLINIC_OR_DEPARTMENT_OTHER): Payer: Self-pay | Admitting: *Deleted

## 2011-08-12 ENCOUNTER — Encounter (HOSPITAL_BASED_OUTPATIENT_CLINIC_OR_DEPARTMENT_OTHER)
Admission: RE | Admit: 2011-08-12 | Discharge: 2011-08-12 | Disposition: A | Discharge status: Home / Self Care | Payer: BC Managed Care – PPO | Source: Ambulatory Visit | Attending: Surgery | Admitting: Surgery

## 2011-08-12 LAB — COMPREHENSIVE METABOLIC PANEL
ALT: 15 U/L (ref 0–35)
AST: 16 U/L (ref 0–37)
Albumin: 3.9 g/dL (ref 3.5–5.2)
Alkaline Phosphatase: 98 U/L (ref 39–117)
BUN: 22 mg/dL (ref 6–23)
CO2: 27 mEq/L (ref 19–32)
Calcium: 9.7 mg/dL (ref 8.4–10.5)
Chloride: 101 mEq/L (ref 96–112)
Creatinine, Ser: 0.56 mg/dL (ref 0.50–1.10)
GFR calc Af Amer: >90 mL/min (ref >90)
GFR calc non Af Amer: >90 mL/min (ref >90)
Glucose, Bld: 95 mg/dL (ref 70–99)
Potassium: 3.8 mEq/L (ref 3.5–5.1)
Sodium: 139 mEq/L (ref 135–145)
Total Bilirubin: 0.2 mg/dL — ABNORMAL LOW (ref 0.3–1.2)
Total Protein: 7.2 g/dL (ref 6.0–8.3)

## 2011-08-12 LAB — DIFFERENTIAL
Basophils Absolute: 0 10*3/uL (ref 0.0–0.1)
Basophils Relative: 0 % (ref 0–1)
Eosinophils Absolute: 0.1 10*3/uL (ref 0.0–0.7)
Eosinophils Relative: 1 % (ref 0–5)
Lymphocytes Relative: 15 % (ref 12–46)
Lymphs Abs: 2.2 10*3/uL (ref 0.7–4.0)
Monocytes Absolute: 0.7 10*3/uL (ref 0.1–1.0)
Monocytes Relative: 5 % (ref 3–12)
Neutro Abs: 11.5 10*3/uL — ABNORMAL HIGH (ref 1.7–7.7)
Neutrophils Relative %: 79 % — ABNORMAL HIGH (ref 43–77)

## 2011-08-12 LAB — CBC
HCT: 39.2 % (ref 36.0–46.0)
Hemoglobin: 12.9 g/dL (ref 12.0–15.0)
MCH: 29.7 pg (ref 26.0–34.0)
MCHC: 32.9 g/dL (ref 30.0–36.0)
MCV: 90.3 fL (ref 78.0–100.0)
Platelets: 402 10*3/uL — ABNORMAL HIGH (ref 150–400)
RBC: 4.34 MIL/uL (ref 3.87–5.11)
RDW: 14.1 % (ref 11.5–15.5)
WBC: 14.5 10*3/uL — ABNORMAL HIGH (ref 4.0–10.5)

## 2011-08-12 NOTE — Progress Notes (Signed)
To come by for labs and ekg-had recent bronchitis-treated-feel bettr Denies any cardiac or sleep apnea-

## 2011-08-12 NOTE — Progress Notes (Signed)
cxr done 6/13 in epic

## 2011-08-16 ENCOUNTER — Encounter (HOSPITAL_BASED_OUTPATIENT_CLINIC_OR_DEPARTMENT_OTHER)
Admission: RE | Disposition: A | Discharge status: Home / Self Care | Payer: Self-pay | Source: Ambulatory Visit | Attending: Surgery

## 2011-08-16 ENCOUNTER — Encounter (HOSPITAL_BASED_OUTPATIENT_CLINIC_OR_DEPARTMENT_OTHER): Payer: Self-pay | Admitting: Anesthesiology

## 2011-08-16 ENCOUNTER — Ambulatory Visit (HOSPITAL_BASED_OUTPATIENT_CLINIC_OR_DEPARTMENT_OTHER): Payer: BC Managed Care – PPO | Admitting: Anesthesiology

## 2011-08-16 ENCOUNTER — Encounter (HOSPITAL_BASED_OUTPATIENT_CLINIC_OR_DEPARTMENT_OTHER): Payer: Self-pay | Admitting: Surgery

## 2011-08-16 ENCOUNTER — Ambulatory Visit (HOSPITAL_BASED_OUTPATIENT_CLINIC_OR_DEPARTMENT_OTHER)
Admission: RE | Admit: 2011-08-16 | Discharge: 2011-08-16 | Disposition: A | Discharge status: Home / Self Care | Payer: BC Managed Care – PPO | Source: Ambulatory Visit | Attending: Surgery | Admitting: Surgery

## 2011-08-16 ENCOUNTER — Encounter (HOSPITAL_BASED_OUTPATIENT_CLINIC_OR_DEPARTMENT_OTHER): Payer: Self-pay | Admitting: *Deleted

## 2011-08-16 DIAGNOSIS — M545 Low back pain, unspecified: Secondary | ICD-10-CM

## 2011-08-16 DIAGNOSIS — N6459 Other signs and symptoms in breast: Secondary | ICD-10-CM | POA: Diagnosis not present

## 2011-08-16 DIAGNOSIS — F3289 Other specified depressive episodes: Secondary | ICD-10-CM

## 2011-08-16 DIAGNOSIS — Z01812 Encounter for preprocedural laboratory examination: Secondary | ICD-10-CM | POA: Insufficient documentation

## 2011-08-16 DIAGNOSIS — F5104 Psychophysiologic insomnia: Secondary | ICD-10-CM

## 2011-08-16 DIAGNOSIS — J449 Chronic obstructive pulmonary disease, unspecified: Secondary | ICD-10-CM | POA: Insufficient documentation

## 2011-08-16 DIAGNOSIS — D249 Benign neoplasm of unspecified breast: Secondary | ICD-10-CM | POA: Insufficient documentation

## 2011-08-16 DIAGNOSIS — N6452 Nipple discharge: Secondary | ICD-10-CM

## 2011-08-16 DIAGNOSIS — J4489 Other specified chronic obstructive pulmonary disease: Secondary | ICD-10-CM | POA: Insufficient documentation

## 2011-08-16 DIAGNOSIS — R609 Edema, unspecified: Secondary | ICD-10-CM

## 2011-08-16 DIAGNOSIS — E785 Hyperlipidemia, unspecified: Secondary | ICD-10-CM

## 2011-08-16 DIAGNOSIS — K219 Gastro-esophageal reflux disease without esophagitis: Secondary | ICD-10-CM

## 2011-08-16 DIAGNOSIS — I10 Essential (primary) hypertension: Secondary | ICD-10-CM | POA: Insufficient documentation

## 2011-08-16 DIAGNOSIS — F329 Major depressive disorder, single episode, unspecified: Secondary | ICD-10-CM

## 2011-08-16 DIAGNOSIS — J019 Acute sinusitis, unspecified: Secondary | ICD-10-CM

## 2011-08-16 DIAGNOSIS — M26629 Arthralgia of temporomandibular joint, unspecified side: Secondary | ICD-10-CM

## 2011-08-16 HISTORY — PX: BREAST DUCTAL SYSTEM EXCISION: SHX5242

## 2011-08-16 HISTORY — DX: Unspecified osteoarthritis, unspecified site: M19.90

## 2011-08-16 HISTORY — DX: Bronchitis, not specified as acute or chronic: J40

## 2011-08-16 SURGERY — EXCISION DUCTAL SYSTEM BREAST
Anesthesia: General | Site: Breast | Laterality: Left | Wound class: Clean

## 2011-08-16 MED ORDER — SCOPOLAMINE 1 MG/3DAYS TD PT72
1.0000 | MEDICATED_PATCH | TRANSDERMAL | Status: DC
  Administered 2011-08-16: 1.5 mg via TRANSDERMAL

## 2011-08-16 MED ORDER — FENTANYL CITRATE 0.05 MG/ML IJ SOLN
INTRAMUSCULAR | Status: DC | PRN
  Administered 2011-08-16: 100 ug via INTRAVENOUS

## 2011-08-16 MED ORDER — LACTATED RINGERS IV SOLN
INTRAVENOUS | Status: DC
  Administered 2011-08-16 (×2): via INTRAVENOUS

## 2011-08-16 MED ORDER — DEXAMETHASONE SODIUM PHOSPHATE 4 MG/ML IJ SOLN
INTRAMUSCULAR | Status: DC | PRN
  Administered 2011-08-16: 10 mg via INTRAVENOUS

## 2011-08-16 MED ORDER — VANCOMYCIN HCL IN DEXTROSE 1-5 GM/200ML-% IV SOLN: 1000.0000 mg | INTRAVENOUS | Status: DC

## 2011-08-16 MED ORDER — CHLORHEXIDINE GLUCONATE 4 % EX LIQD: 1.0000 "application " | Freq: Once | CUTANEOUS | Status: DC

## 2011-08-16 MED ORDER — LIDOCAINE HCL (CARDIAC) 20 MG/ML IV SOLN
INTRAVENOUS | Status: DC | PRN
  Administered 2011-08-16: 50 mg via INTRAVENOUS

## 2011-08-16 MED ORDER — BUPIVACAINE-EPINEPHRINE 0.25% -1:200000 IJ SOLN
INTRAMUSCULAR | Status: DC | PRN
  Administered 2011-08-16: 10 mL

## 2011-08-16 MED ORDER — HYDROCODONE-ACETAMINOPHEN 5-325 MG PO TABS: 1.0000 | ORAL_TABLET | Freq: Four times a day (QID) | ORAL | Status: AC | PRN

## 2011-08-16 MED ORDER — VANCOMYCIN HCL 1000 MG IV SOLR
1500.0000 mg | INTRAVENOUS | Status: AC
  Administered 2011-08-16: 1500 mg via INTRAVENOUS

## 2011-08-16 MED ORDER — 0.9 % SODIUM CHLORIDE (POUR BTL) OPTIME
TOPICAL | Status: DC | PRN
  Administered 2011-08-16: 1000 mL

## 2011-08-16 MED ORDER — PROPOFOL 10 MG/ML IV EMUL
INTRAVENOUS | Status: DC | PRN
  Administered 2011-08-16: 200 mg via INTRAVENOUS

## 2011-08-16 MED ORDER — ONDANSETRON HCL 4 MG/2ML IJ SOLN
INTRAMUSCULAR | Status: DC | PRN
  Administered 2011-08-16: 4 mg via INTRAVENOUS

## 2011-08-16 MED ORDER — MIDAZOLAM HCL 5 MG/5ML IJ SOLN
INTRAMUSCULAR | Status: DC | PRN
  Administered 2011-08-16: 2 mg via INTRAVENOUS

## 2011-08-16 SURGICAL SUPPLY — 44 items
ADH SKN CLS APL DERMABOND .7 (GAUZE/BANDAGES/DRESSINGS) ×1
BINDER BREAST XXLRG (GAUZE/BANDAGES/DRESSINGS) ×2 IMPLANT
BLADE SURG 15 STRL LF DISP TIS (BLADE) ×1 IMPLANT
BLADE SURG 15 STRL SS (BLADE) ×2
CANISTER SUCTION 1200CC (MISCELLANEOUS) ×2 IMPLANT
CHLORAPREP W/TINT 26ML (MISCELLANEOUS) ×2 IMPLANT
CLIP TI WIDE RED SMALL 6 (CLIP) IMPLANT
CLOTH BEACON ORANGE TIMEOUT ST (SAFETY) ×2 IMPLANT
COVER MAYO STAND STRL (DRAPES) ×2 IMPLANT
COVER TABLE BACK 60X90 (DRAPES) ×2 IMPLANT
DECANTER SPIKE VIAL GLASS SM (MISCELLANEOUS) ×2 IMPLANT
DERMABOND ADVANCED (GAUZE/BANDAGES/DRESSINGS) ×1
DERMABOND ADVANCED .7 DNX12 (GAUZE/BANDAGES/DRESSINGS) ×1 IMPLANT
DEVICE DUBIN W/COMP PLATE 8390 (MISCELLANEOUS) IMPLANT
DRAPE LAPAROSCOPIC ABDOMINAL (DRAPES) IMPLANT
DRAPE PED LAPAROTOMY (DRAPES) ×2 IMPLANT
DRAPE UTILITY XL STRL (DRAPES) ×2 IMPLANT
ELECT COATED BLADE 2.86 ST (ELECTRODE) ×2 IMPLANT
ELECT REM PT RETURN 9FT ADLT (ELECTROSURGICAL) ×2
ELECTRODE REM PT RTRN 9FT ADLT (ELECTROSURGICAL) ×1 IMPLANT
GLOVE BIO SURGEON STRL SZ 6.5 (GLOVE) ×2 IMPLANT
GLOVE BIOGEL PI IND STRL 7.0 (GLOVE) ×1 IMPLANT
GLOVE BIOGEL PI IND STRL 8 (GLOVE) ×1 IMPLANT
GLOVE BIOGEL PI INDICATOR 7.0 (GLOVE) ×1
GLOVE BIOGEL PI INDICATOR 8 (GLOVE) ×1
GLOVE ECLIPSE 8.0 STRL XLNG CF (GLOVE) ×4 IMPLANT
GOWN PREVENTION PLUS XLARGE (GOWN DISPOSABLE) ×4 IMPLANT
NEEDLE HYPO 25X1 1.5 SAFETY (NEEDLE) ×2 IMPLANT
NS IRRIG 1000ML POUR BTL (IV SOLUTION) ×2 IMPLANT
PACK BASIN DAY SURGERY FS (CUSTOM PROCEDURE TRAY) ×2 IMPLANT
PENCIL BUTTON HOLSTER BLD 10FT (ELECTRODE) ×2 IMPLANT
SLEEVE SCD COMPRESS KNEE MED (MISCELLANEOUS) ×2 IMPLANT
SPONGE LAP 4X18 X RAY DECT (DISPOSABLE) ×2 IMPLANT
STAPLER VISISTAT 35W (STAPLE) IMPLANT
SUT MON AB 4-0 PC3 18 (SUTURE) ×2 IMPLANT
SUT SILK 2 0 SH (SUTURE) IMPLANT
SUT VIC AB 3-0 SH 27 (SUTURE) ×2
SUT VIC AB 3-0 SH 27X BRD (SUTURE) ×1 IMPLANT
SYR CONTROL 10ML LL (SYRINGE) ×2 IMPLANT
TOWEL OR 17X24 6PK STRL BLUE (TOWEL DISPOSABLE) IMPLANT
TOWEL OR NON WOVEN STRL DISP B (DISPOSABLE) ×2 IMPLANT
TUBE CONNECTING 20X1/4 (TUBING) ×2 IMPLANT
WATER STERILE IRR 1000ML POUR (IV SOLUTION) IMPLANT
YANKAUER SUCT BULB TIP NO VENT (SUCTIONS) ×2 IMPLANT

## 2011-08-16 NOTE — Anesthesia Procedure Notes (Signed)
Procedure Name: LMA Insertion Date/Time: 08/16/2011 7:34 AM Performed by: Gar Gibbon Pre-anesthesia Checklist: Patient identified, Emergency Drugs available, Suction available and Patient being monitored Patient Re-evaluated:Patient Re-evaluated prior to inductionOxygen Delivery Method: Circle System Utilized Preoxygenation: Pre-oxygenation with 100% oxygen Intubation Type: IV induction Ventilation: Mask ventilation without difficulty LMA: LMA with gastric port inserted LMA Size: 4.0 Number of attempts: 1 Placement Confirmation: positive ETCO2 Tube secured with: Tape Dental Injury: Teeth and Oropharynx as per pre-operative assessment

## 2011-08-16 NOTE — Op Note (Signed)
Preop diagnosis: Left breast nipple discharge  Postop diagnosis: Same  Procedure: left  Central breast duct excision  Surgeon: Harriette Bouillon M.D.  Anesthesia: LMA  with local anesthesia 0.25 % bupivicaine with epi  EBL: Minimal  Specimen: Breast tissue to pathology  Drains: None  Indications for procedure: The patient presents for central duct excision of the breast due to left  nipple discharge. Workup included mammography and ultrasound. No filling defect was noted in the central ducts of the breast but the drainage persisted and the patient wanted excision.  Discussion of operative and nonoperative means were done with the patient. Risks of operative excision include bleeding, infection, recurrence, nipple loss,   and the need for further  surgery, seroma, anesthesia risks. Observation is the other choice discussed. The patient wishes to proceed for excision.  Description of procedure: The patient was seen all the area. Questions are answered. The left breast was marked. The patient was taken back to the operating room. The patient was placed supine on the operating room table. General anesthesia was initiated. Upper chest was prepped and draped in a sterile fashion. A timeout was done. She received preoperative antibiotics within an hour of incision. A curvilinear incision was made along the border of the nipple. The nipple was elevated carefully. The central duct was identified a probe and correlated with preoperative imaging. The duct and neighboring tissue were excised. This was sent to pathology. The wound was irrigated and then closed with 3-0 Vicryl and 4-0 Monocryl in a subcuticular fashion Dermabond was applied. Patient was awoke and taken to recovery in satisfactory condition. All final counts the sponge, instruments and needles were correct.

## 2011-08-16 NOTE — Interval H&P Note (Signed)
History and Physical Interval Note:  08/16/2011 7:09 AM  [name]  has presented today for surgery, with the diagnosis of left nipple discharge  The various methods of treatment have been discussed with the patient and family. After consideration of risks, benefits and other options for treatment, the patient has consented to  Procedure(s) (LRB): EXCISION DUCTAL SYSTEM BREAST (Left) as a surgical intervention .  The patient's history has been reviewed, patient examined, no change in status, stable for surgery.  I have reviewed the patients' chart and labs.  Questions were answered to the patient's satisfaction.     Jachelle Fluty A.

## 2011-08-16 NOTE — H&P (Signed)
[name]    MRN: [id number]   Description: 56 year old female  Provider: Dortha Schwalbe., MD  Department: [facility]        Diagnoses     Nipple discharge in female   - Primary    611.79    left        Reason for Visit     Re-evaluation    Left Br Drainage/discharge        Vitals - Last Recorded       BP Pulse Temp Resp Ht Wt    108/66 76 98.1 F (36.7 C) (Temporal) 16 5\' 4"  (1.626 m) 264 lb 6.4 oz (119.931 kg)         BMI              45.38 kg/m2                   Patient presents with   .  Re-evaluation       Left Br Drainage/discharge      HPI [name] is a 56 y.o. female.   HPI The patient returns  for left breast nipple discharge. It has been present for a 8   months. She denies any breast mass. The discharge is clear. It is nonbloody. It is intermittent. It is increasing  in quantity. No right nipple discharge noted.    Past Medical History   Diagnosis  Date   .  ANGIOMA  10/03/2008   .  HYPERLIPIDEMIA  08/27/2008   .  DEPRESSION  12/10/2009   .  HYPERTENSION  08/27/2008   .  GERD  08/27/2008   .  SEBORRHEIC KERATOSIS  08/27/2008       Past Surgical History   Procedure  Date   .  Cesarean section  1980   .  Cesarean section  1982   .  Cholecystectomy  1997   .  Abdominal hysterectomy  1995   .  Foot surgery  2004, 2005, 2008   .  Knee arthroscopy  2007, 2208, 2009   .  Nasal sinus surgery  2013       Family History   Problem  Relation  Age of Onset   .  Cancer  Mother         breast   .  Cancer  Father         prostate   .  Diabetes  Other         grandparent   .  Heart disease  Other         parent   .  Kidney disease  Other         parent   .  Cancer  Other         parent   .  Hypertension  Other     .  Hyperlipidemia  Other        Social History History   Substance Use Topics   .  Smoking status:  Former Smoker -- 0.5 packs/day for 5 years       Types:  Cigarettes       Quit date:  02/22/1990   .   Smokeless tobacco:  Not on file   .  Alcohol Use:  No       Allergies   Allergen  Reactions   .  Codeine Sulfate         REACTION: GI upset   .  Penicillins  REACTION: rash       Current Outpatient Prescriptions   Medication  Sig  Dispense  Refill   .  acetaminophen (TYLENOL ARTHRITIS PAIN) 650 MG CR tablet  Take 650 mg by mouth every 8 (eight) hours as needed.           Marland Kitchen  aspirin 81 MG tablet  Take 81 mg by mouth daily.           .  chlorpheniramine-hydrocodone (TUSSIONEX) 8-10 MG/5ML suspension  Take 5 mLs by mouth every 12 (twelve) hours as needed for cough.   90 mL   0   .  estradiol (ESTRACE) 1 MG tablet  TAKE 1 TABLET (1 MG TOTAL) DAILY   90 tablet   3   .  etanercept (ENBREL) 50 MG/ML injection  Inject 50 mg into the skin once a week.           .  fluticasone (FLONASE) 50 MCG/ACT nasal spray  USE 2 SPRAYS NASALLY DAILY   48 g   3   .  folic acid (FOLVITE) 1 MG tablet  Take 1 mg by mouth daily.           .  hydrochlorothiazide (HYDRODIURIL) 25 MG tablet  TAKE 1 TABLET DAILY   90 tablet   3   .  lisinopril (PRINIVIL,ZESTRIL) 20 MG tablet  TAKE 1 TABLET DAILY   90 tablet   3   .  methotrexate 2.5 MG tablet  Weekly injection per Dr. Dierdre Forth          .  omeprazole (PRILOSEC) 20 MG capsule  TAKE 1 CAPSULE TWICE DAILY   180 capsule   3   .  oxyCODONE-acetaminophen (PERCOCET) 5-325 MG per tablet  Take 1 tablet by mouth every 6 (six) hours as needed.           .  predniSONE (DELTASONE) 5 MG tablet  Take 5 mg by mouth daily.           .  sertraline (ZOLOFT) 50 MG tablet  Take 1 tablet (50 mg total) by mouth daily.   90 tablet   2   .  simvastatin (ZOCOR) 20 MG tablet  TAKE 1 TABLET (20MG  TOTAL) AT BEDTIME   90 tablet   3   .  zolpidem (AMBIEN) 10 MG tablet  Take 1 tablet (10 mg total) by mouth at bedtime as needed for sleep.   90 tablet   0      Review of Systems Review of Systems  Constitutional: Negative for fever, chills and unexpected weight change.  HENT: Negative for  hearing loss, congestion, sore throat, trouble swallowing and voice change.   Eyes: Negative for visual disturbance.  Respiratory: Negative for cough and wheezing.   Cardiovascular: Negative for chest pain, palpitations and leg swelling.  Gastrointestinal: Negative for nausea, vomiting, abdominal pain, diarrhea, constipation, blood in stool, abdominal distention and anal bleeding.  Genitourinary: Negative for hematuria, vaginal bleeding and difficulty urinating.  Musculoskeletal: Negative for arthralgias.  Skin: Negative for rash and wound.  Neurological: Negative for seizures, syncope and headaches.  Hematological: Negative for adenopathy. Does not bruise/bleed easily.  Psychiatric/Behavioral: Negative for confusion.      Blood pressure 108/66, pulse 76, temperature 98.1 F (36.7 C), temperature source Temporal, resp. rate 16, height 5\' 4"  (1.626 m), weight 264 lb 6.4 oz (119.931 kg).   Physical Exam Physical Exam  Constitutional: She is oriented to person, place, and time. She appears well-developed and  well-nourished.  HENT:   Head: Normocephalic and atraumatic.   Nose: Nose normal.  Eyes: EOM are normal. Pupils are equal, round, and reactive to light.  Neck: Normal range of motion. Neck supple.  Cardiovascular: Normal rate, regular rhythm and normal heart sounds.  Exam reveals no gallop and no friction rub.    No murmur heard. Pulmonary/Chest: Effort normal and breath sounds normal.       Normal breast exam.  No discharge from either nipple.  Abdominal: Soft. Bowel sounds are normal.  Musculoskeletal: Normal range of motion.  Neurological: She is alert and oriented to person, place, and time.  Skin: Skin is warm and dry.  Psychiatric: She has a normal mood and affect. Her behavior is normal. Judgment and thought content normal.      Data Reviewed Mammogram normal Ductogram not possible.   Assessment Left breast nipple discharge clear   Plan   I discussed with  the patient options. The drainage is clear which is a benign drainage.      She is tired of the drainage and would like to proceed with central duct excision on the left. The procedure has been discussed with the patient. Alternatives to surgery have been discussed with the patient.  Risks of surgery include bleeding,  Infection,  Nipple loss,   Seroma formation, death,  and the need for further surgery.   The patient understands and wishes to proceed.     Felicia Desmarais A. 08/16/2011                Not recorded       Discontinued Medications         Reason for Discontinue    methocarbamol (ROBAXIN) 500 MG tablet Error           Patient Instructions     Breast Biopsy WHY YOU NEED A BIOPSY  Your caregiver has recommended that you have a breast tissue sample taken (biopsy). This is done to be certain that the lump or abnormality found in your breast is not cancerous (malignant). During a biopsy, a small piece of tissue is removed, so it can be examined under a microscope by a specialist (pathologist) who looks at tissues and cells and diagnoses abnormalities in them. Most lumps (tumors) or abnormalities, on or in the breast, are not cancerous (benign). However, biopsies are taken when your caregiver cannot be absolutely certain of what is wrong only from doing a physical exam, mammogram (breast X-ray), or other studies. A breast biopsy can tell you whether nothing more needs to be done, or you need more surgery or another type of treatment. A biopsy is done when there is: Any undiagnosed breast mass.   Nipple abnormalities, dimpling, crusting, or ulcerations.   Calcium deposits (calcifications) or abnormalities seen on your mammogram, ultrasound, or MRI.   Suspicious changes in the breast (thickening, asymmetry) seen on mammogram.   Abnormal discharge from the nipple, especially blood.   Redness, swelling, and pain of the breast.  HOW A BIOPSY IS PERFORMED A biopsy is often  performed on an outpatient basis (you go home the same day). This can be done in a hospital, clinic, or surgical center. Tissue samples (biopsies) are often done under local anesthesia (area is numbed). Sometimes general anesthetics are required, in which case you sleep through the procedure. Biopsies may remove the entire lump, a small piece of the lump, or a small sliver of tissue removed by needle. TYPES OF BREAST BIOPSY Fine needle aspiration. A  thin needle is placed through the skin, to the lump or cyst, and cells are removed.   Core needle biopsy. A large needle with a special tip is placed through the skin, to the abnormality, and a piece of tissue is removed.   Stereotactic biopsy. A core needle with a special X-ray is used, to direct the needle to the lump or abnormal area, which is difficult to feel or cannot be felt.   Vacuum-assisted biopsy. A hollow probe and a gentle vacuum remove a sample of tissue.   Ultrasound guided core needle biopsy. You lie on your stomach, with your breast through an opening, and a high frequency ultrasound helps guide the needle to the area of the abnormality.   Open biopsy. An incision is made in the breast, and a piece of the lump or the whole lump is removed.  LET YOUR CAREGIVER KNOW ABOUT: Allergies.   Medicines taken, including herbs, eye drops, over-the-counter medicines, and creams.   Use of steroids (by mouth or creams).   Previous problems with anesthetics or Novocaine.   If you are taking aspirin or blood thinners.   Possibility of pregnancy, if this applies.   History of blood clots (thrombophlebitis).   History of bleeding or blood problems.   Previous surgery.   Other health problems.  RISKS AND COMPLICATIONS   Bleeding.   Infection.   Allergy to medicines.   Bruising and swelling of the breast.   Alteration in the shape of the breast.   Not finding the lump or abnormality.   Needing more surgery.  BEFORE THE PROCEDURE You should arrive  60 minutes prior to your procedure or as directed.   Check-in at the admissions desk, to fill out necessary forms, if you are not preregistered.   There will be consent forms to sign, prior to the procedure.   There is a waiting area for your family, while you are having your biopsy.   Try to have someone with you, to drive you home.   Do not smoke for 2 weeks before the surgery.   Let your caregiver know if you develop a cold or an infection.   Do not drink alcohol for at least 24 hours before surgery.   Wear a good support bra to the surgery.  AFTER THE PROCEDURE After surgery, you will be taken to the recovery area, where a nurse will watch and check your progress. Once you are awake, stable, and taking fluids well, if there are no other problems, you will be allowed to go home.   Ice packs applied to your operative site may help with discomfort and keep the swelling down.   You may resume normal diet and activities as directed. Avoid strenuous activities affecting the arm on the side of the biopsy, such as tennis, swimming, heavy lifting (more than 10 pounds) or pulling.   Bruising in the breast is normal following this procedure.   Wearing a support bra, even to bed, may be more comfortable. The bra will also help keep the dressing on.   Change dressings as directed.   Your doctor may apply a pressure dressing on your breast for 24 to 48 hours.   Only take over-the-counter or prescription medicines for pain, discomfort, or fever as directed by your caregiver.   Do not take aspirin, because it can cause bleeding.  HOME CARE INSTRUCTIONS   You may resume your usual diet.   Have someone drive you home after the surgery.  Do not do any exercise, driving, lifting or general activities without your caregiver's permission.   Take medicines and over-the-counter medicines, as ordered by your caregiver.   Keep your postoperative appointments as recommended.   Do not drink alcohol while taking  pain medicine.  Finding out the results of your test Not all test results are available during your visit. If your test results are not back during the visit, make an appointment with your caregiver to find out the results. Do not assume everything is normal if you have not heard from your caregiver or the medical facility. It is important for you to follow up on all of your test results.   SEEK MEDICAL CARE IF:   You notice redness, swelling, or increasing pain in the wound.   You notice a bad smell coming from the wound or dressing.   You develop a rash.   You need stronger pain medicine.   You are having an allergic reaction or problems with your medicines.  SEEK IMMEDIATE MEDICAL CARE IF:   You have difficulty breathing.   You have a fever.   There is increased bleeding (more than a small spot) from the wound.   Pus is coming from the wound.   The wound is breaking open.  Document Released: 02/08/2005 Document Revised: 01/28/2011 Document Reviewed: 12/27/2008 [facility] Patient Information 2012 [place], [place].       Level of Service     PR OFFICE CONSULTATION NEW/ESTAB PATIENT 40 MIN [99243]      Follow-up and Disposition     Return if symptoms worsen or fail to improve.       Routing History Recorded        All Flowsheet Templates (all recorded)     Theatre stage manager Formula Data Flowsheet    Anthropometrics Flowsheet               Referring Provider

## 2011-08-16 NOTE — Anesthesia Preprocedure Evaluation (Signed)
Anesthesia Evaluation  Patient identified by MRN, date of birth, ID band Patient awake    Reviewed: Allergy & Precautions, H&P , NPO status , Patient's Chart, lab work & pertinent test results  History of Anesthesia Complications (+) PONV  Airway Mallampati: II TM Distance: >3 FB Neck ROM: Full    Dental No notable dental hx. (+) Teeth Intact, Dental Advisory Given and Missing   Pulmonary shortness of breath and with exertion, asthma , COPD COPD inhaler,  breath sounds clear to auscultation  Pulmonary exam normal       Cardiovascular hypertension, Pt. on medications Rhythm:Regular Rate:Normal     Neuro/Psych negative neurological ROS  negative psych ROS   GI/Hepatic Neg liver ROS, GERD-  Medicated and Controlled,  Endo/Other  Morbid obesity  Renal/GU negative Renal ROS     Musculoskeletal   Abdominal (+) + obese,   Peds  Hematology negative hematology ROS (+)   Anesthesia Other Findings   Reproductive/Obstetrics                           Anesthesia Physical Anesthesia Plan  ASA: III  Anesthesia Plan: General   Post-op Pain Management:    Induction: Intravenous  Airway Management Planned: LMA  Additional Equipment:   Intra-op Plan:   Post-operative Plan:   Informed Consent: I have reviewed the patients History and Physical, chart, labs and discussed the procedure including the risks, benefits and alternatives for the proposed anesthesia with the patient or authorized representative who has indicated his/her understanding and acceptance.   Dental advisory given  Plan Discussed with: CRNA and Surgeon  Anesthesia Plan Comments: (Plan routine monitors, GA- LMA )        Anesthesia Quick Evaluation

## 2011-08-16 NOTE — Anesthesia Postprocedure Evaluation (Signed)
  Anesthesia Post-op Note  Patient: [name]  Procedure(s) Performed: Procedure(s) (LRB): EXCISION DUCTAL SYSTEM BREAST (Left)  Patient Location: PACU  Anesthesia Type: General  Level of Consciousness: awake, alert  and oriented  Airway and Oxygen Therapy: Patient Spontanous Breathing  Post-op Pain: none  Post-op Assessment: Post-op Vital signs reviewed, Patient's Cardiovascular Status Stable, Respiratory Function Stable, Patent Airway, No signs of Nausea or vomiting and Pain level controlled  Post-op Vital Signs: Reviewed and stable  Complications: No apparent anesthesia complications

## 2011-08-16 NOTE — Transfer of Care (Signed)
Immediate Anesthesia Transfer of Care Note  Patient: [name]  Procedure(s) Performed: Procedure(s) (LRB): EXCISION DUCTAL SYSTEM BREAST (Left)  Patient Location: PACU  Anesthesia Type: General  Level of Consciousness: awake and oriented  Airway & Oxygen Therapy: Patient Spontanous Breathing and Patient connected to face mask oxygen  Post-op Assessment: Report given to PACU RN  Post vital signs: Reviewed and stable  Complications: No apparent anesthesia complications

## 2011-08-16 NOTE — Discharge Instructions (Signed)
[facility] °Office Phone Number [phone] ° °BREAST BIOPSY/ PARTIAL MASTECTOMY: POST OP INSTRUCTIONS ° °Always review your discharge instruction sheet given to you by the facility where your surgery was performed. ° °IF YOU HAVE DISABILITY OR FAMILY LEAVE FORMS, YOU MUST BRING THEM TO THE OFFICE FOR PROCESSING.  DO NOT GIVE THEM TO YOUR DOCTOR. ° °1. A prescription for pain medication may be given to you upon discharge.  Take your pain medication as prescribed, if needed.  If narcotic pain medicine is not needed, then you may take acetaminophen (Tylenol) or ibuprofen (Advil) as needed. °2. Take your usually prescribed medications unless otherwise directed °3. If you need a refill on your pain medication, please contact your pharmacy.  They will contact our office to request authorization.  Prescriptions will not be filled after 5pm or on week-ends. °4. You should eat very light the first 24 hours after surgery, such as soup, crackers, pudding, etc.  Resume your normal diet the day after surgery. °5. Most patients will experience some swelling and bruising in the breast.  Ice packs and a good support bra will help.  Swelling and bruising can take several days to resolve.  °6. It is common to experience some constipation if taking pain medication after surgery.  Increasing fluid intake and taking a stool softener will usually help or prevent this problem from occurring.  A mild laxative (Milk of Magnesia or Miralax) should be taken according to package directions if there are no bowel movements after 48 hours. °7. Unless discharge instructions indicate otherwise, you may remove your bandages 24-48 hours after surgery, and you may shower at that time.  You may have steri-strips (small skin tapes) in place directly over the incision.  These strips should be left on the skin for 7-10 days.  If your surgeon used skin glue on the incision, you may shower in 24 hours.  The glue will flake off over the  next 2-3 weeks.  Any sutures or staples will be removed at the office during your follow-up visit. °8. ACTIVITIES:  You may resume regular daily activities (gradually increasing) beginning the next day.  Wearing a good support bra or sports bra minimizes pain and swelling.  You may have sexual intercourse when it is comfortable. °a. You may drive when you no longer are taking prescription pain medication, you can comfortably wear a seatbelt, and you can safely maneuver your car and apply brakes. °b. RETURN TO WORK:  ______________________________________________________________________________________ °9. You should see your doctor in the office for a follow-up appointment approximately two weeks after your surgery.  Your doctor’s nurse will typically make your follow-up appointment when she calls you with your pathology report.  Expect your pathology report 2-3 business days after your surgery.  You may call to check if you do not hear from us after three days. °10. OTHER INSTRUCTIONS: _______________________________________________________________________________________________ _____________________________________________________________________________________________________________________________________ °_____________________________________________________________________________________________________________________________________ °_____________________________________________________________________________________________________________________________________ ° °WHEN TO CALL YOUR DOCTOR: °1. Fever over 101.0 °2. Nausea and/or vomiting. °3. Extreme swelling or bruising. °4. Continued bleeding from incision. °5. Increased pain, redness, or drainage from the incision. ° °The clinic staff is available to answer your questions during regular business hours.  Please don’t hesitate to call and ask to speak to one of the nurses for clinical concerns.  If you have a medical emergency, go to the nearest  emergency room or call 911.  A surgeon from [facility] is always on call at the hospital. ° °For further questions, please visit [email or web address]  ° ° °  Post Anesthesia Home Care Instructions ° °Activity: °Get plenty of rest for the remainder of the day. A responsible adult should stay with you for 24 hours following the procedure.  °For the next 24 hours, DO NOT: °-Drive a car °-Operate machinery °-Drink alcoholic beverages °-Take any medication unless instructed by your physician °-Make any legal decisions or sign important papers. ° °Meals: °Start with liquid foods such as gelatin or soup. Progress to regular foods as tolerated. Avoid greasy, spicy, heavy foods. If nausea and/or vomiting occur, drink only clear liquids until the nausea and/or vomiting subsides. Call your physician if vomiting continues. ° °Special Instructions/Symptoms: °Your throat may feel dry or sore from the anesthesia or the breathing tube placed in your throat during surgery. If this causes discomfort, gargle with warm salt water. The discomfort should disappear within 24 hours. ° °

## 2011-08-18 ENCOUNTER — Telehealth (INDEPENDENT_AMBULATORY_CARE_PROVIDER_SITE_OTHER): Payer: Self-pay

## 2011-08-18 ENCOUNTER — Encounter (HOSPITAL_BASED_OUTPATIENT_CLINIC_OR_DEPARTMENT_OTHER): Payer: Self-pay | Admitting: Surgery

## 2011-08-18 NOTE — Telephone Encounter (Signed)
Left message for patient to call, pathology results available.

## 2011-08-18 NOTE — Telephone Encounter (Signed)
Message copied by Maryan Puls on Wed Aug 18, 2011  3:29 PM ------      Message from: Harriette Bouillon A      Created: Tue Aug 17, 2011  5:33 PM       Looks great.  margin clear

## 2011-08-18 NOTE — Telephone Encounter (Signed)
Pathology results and follow up appointment for 09/02/11 @ 1:30pm w/Dr. Luisa Hart given to patient.

## 2011-09-02 ENCOUNTER — Encounter (INDEPENDENT_AMBULATORY_CARE_PROVIDER_SITE_OTHER): Payer: Self-pay | Admitting: Surgery

## 2011-09-02 ENCOUNTER — Ambulatory Visit (INDEPENDENT_AMBULATORY_CARE_PROVIDER_SITE_OTHER): Payer: BC Managed Care – PPO | Admitting: Surgery

## 2011-09-02 VITALS — BP 138/80 | HR 68 | Temp 97.9°F | Resp 16 | Ht 64.0 in | Wt 266.2 lb

## 2011-09-02 DIAGNOSIS — Z9889 Other specified postprocedural states: Secondary | ICD-10-CM

## 2011-09-02 NOTE — Patient Instructions (Signed)
Return as needed.  Final report shows papilloma.

## 2011-09-02 NOTE — Progress Notes (Signed)
Patient returns after her left breast central duct excision. Pathology showed papilloma without atypia.  Exam: Left breast wound clean dry and intact without signs of infection.  Impression: Status post left breast central duct excision  Plan: Return as needed. Resume full activity. Mammogram in one year.

## 2011-09-07 ENCOUNTER — Other Ambulatory Visit: Payer: Self-pay | Admitting: Family Medicine

## 2011-09-08 NOTE — Telephone Encounter (Signed)
May refill #90.

## 2011-09-08 NOTE — Telephone Encounter (Signed)
ambien refill request, last filled 06/11/11, #90 with 0 refill

## 2011-09-10 NOTE — Telephone Encounter (Signed)
Zolpidem at HS prn last filled 06-11-11, #90 with 0 refills

## 2011-09-28 ENCOUNTER — Ambulatory Visit (INDEPENDENT_AMBULATORY_CARE_PROVIDER_SITE_OTHER)
Admission: RE | Admit: 2011-09-28 | Discharge: 2011-09-28 | Disposition: A | Discharge status: Home / Self Care | Payer: BC Managed Care – PPO | Source: Ambulatory Visit | Attending: Family Medicine | Admitting: Family Medicine

## 2011-09-28 ENCOUNTER — Ambulatory Visit (INDEPENDENT_AMBULATORY_CARE_PROVIDER_SITE_OTHER): Payer: BC Managed Care – PPO | Admitting: Family Medicine

## 2011-09-28 ENCOUNTER — Encounter: Payer: Self-pay | Admitting: Family Medicine

## 2011-09-28 VITALS — BP 120/68 | Temp 97.8°F | Wt 265.0 lb

## 2011-09-28 DIAGNOSIS — M542 Cervicalgia: Secondary | ICD-10-CM

## 2011-09-28 NOTE — Progress Notes (Signed)
  Subjective:    Patient ID: [name], female    DOB: 07/16/1955, 56 y.o.   MRN: [id number]  HPI  Lower cervical neck pain. Duration about 6 months. No injury. Occasional tingling sensation left upper extremity. Question of some recent weakness left upper extremity. Patient has occasional neck pain 10 out of 10 but mostly 4-5/10. Tried meloxicam and Robaxin without improvement. Pain radiates lower cervical region down occasionally to hand. Occasional headaches. Frequent early morning stiffness. No other joint involvement. Symptoms are progressive. No history of neck x-rays. No appetite or weight changes. Has oxycodone prescription but does not like to take.  Past Medical History  Diagnosis Date  . ANGIOMA 10/03/2008  . HYPERLIPIDEMIA 08/27/2008  . DEPRESSION 12/10/2009  . HYPERTENSION 08/27/2008  . GERD 08/27/2008  . SEBORRHEIC KERATOSIS 08/27/2008  . Arthritis     RA  . Shortness of breath   . Bronchitis   . Hyperlipemia    Past Surgical History  Procedure Date  . Cesarean section 1980  . Cesarean section 1982  . Cholecystectomy 1997  . Abdominal hysterectomy 1995  . Foot surgery 2004, 2005, 2008  . Knee arthroscopy 2007, 2208, 2009  . Nasal sinus surgery 2013  . Joint replacement 10,12    lt total knee  . Breast ductal system excision 08/16/2011    Procedure: EXCISION DUCTAL SYSTEM BREAST;  Surgeon: Clovis Pu. Cornett, MD;  Location: [facility];  Service: General;  Laterality: Left;  left breast duct excision    reports that she quit smoking about 21 years ago. Her smoking use included Cigarettes. She has a 2.5 pack-year smoking history. She does not have any smokeless tobacco history on file. She reports that she does not drink alcohol or use illicit drugs. family history includes Cancer in her father, mother, and other; Diabetes in her other; Heart disease in her other; Hyperlipidemia in her other; Hypertension in her other; and Kidney disease in her  other. Allergies  Allergen Reactions  . Codeine Sulfate     REACTION: GI upset  . Penicillins     REACTION: rash      Review of Systems  Constitutional: Negative for appetite change and unexpected weight change.  HENT: Positive for neck pain.   Respiratory: Negative for cough and shortness of breath.   Cardiovascular: Negative for chest pain.  Neurological: Positive for weakness and numbness.       Objective:   Physical Exam  Constitutional: She appears well-developed and well-nourished.  Cardiovascular: Normal rate and regular rhythm.   Pulmonary/Chest: Effort normal and breath sounds normal. No respiratory distress. She has no wheezes. She has no rales.  Musculoskeletal:       Good range of motion cervical spine. She has some pain with extreme flexion and extension as well as lateral bending and rotation to the left but not the right. No spinal tenderness  Neurological:       Possibly very mild interosseous muscle weakness left hand compared to right. Question of mild interosseous muscle atrophy left hand. Good grip strength bilaterally. Symmetric upper extremity reflexes. Normal sensory function to touch          Assessment & Plan:  Progressive lower cervical neck pain. Question spondylosis with some mild nerve root impingement. Start with plain cervical spine films. She has already tried anti-inflammatories without relief.

## 2011-09-29 NOTE — Progress Notes (Signed)
Quick Note:  Pt informed and voiced agreement ______

## 2011-10-08 ENCOUNTER — Telehealth: Payer: Self-pay | Admitting: Family Medicine

## 2011-10-08 MED ORDER — PREDNISONE 20 MG PO TABS: 20.0000 mg | ORAL_TABLET | Freq: Two times a day (BID) | ORAL | Status: DC

## 2011-10-08 NOTE — Telephone Encounter (Signed)
Caller: [name]/Patient; Patient Name: [name]; PCP: Evelena Peat; Best Callback Phone Number: [phone] Seen in office last week and Dr. Caryl Never advised for her to call back if sx not improved. She has been using Alleve twice daily, doing self massage, and warm compresses -still hurting and  trouble sleeping, L hands tingling. Hurts worse when turning head to the L, having headaches, using cold pack to back of head - which helps with headaches. Traige per neck Pain Protocol and advised to continue with MD instructions until she hears from office. MD mentioned possibly doing MRI-  She uses [facility] in case medication called in. PLEASE f/u.

## 2011-10-08 NOTE — Telephone Encounter (Signed)
She takes chronic low dose prednisone but lets bump her prednisone to 40 mg daily for 6 days then resume regular dose. Give me feedback early next week if no improvement at that time consider MRI scan

## 2011-10-08 NOTE — Telephone Encounter (Signed)
error 

## 2011-10-26 DIAGNOSIS — M25569 Pain in unspecified knee: Secondary | ICD-10-CM | POA: Diagnosis not present

## 2011-10-26 DIAGNOSIS — M25559 Pain in unspecified hip: Secondary | ICD-10-CM | POA: Diagnosis not present

## 2011-11-04 DIAGNOSIS — M79609 Pain in unspecified limb: Secondary | ICD-10-CM | POA: Diagnosis not present

## 2011-11-12 ENCOUNTER — Ambulatory Visit (INDEPENDENT_AMBULATORY_CARE_PROVIDER_SITE_OTHER): Payer: BC Managed Care – PPO

## 2011-11-12 DIAGNOSIS — Z23 Encounter for immunization: Secondary | ICD-10-CM

## 2011-11-17 ENCOUNTER — Other Ambulatory Visit: Payer: Self-pay | Admitting: *Deleted

## 2011-11-17 DIAGNOSIS — M25559 Pain in unspecified hip: Secondary | ICD-10-CM | POA: Diagnosis not present

## 2011-11-17 NOTE — Telephone Encounter (Signed)
Last filled 09-07-11, #90 with 0 refills

## 2011-11-18 MED ORDER — ZOLPIDEM TARTRATE 10 MG PO TABS: 10.0000 mg | ORAL_TABLET | Freq: Every evening | ORAL | Status: DC | PRN

## 2011-11-18 NOTE — Telephone Encounter (Signed)
Refill OK

## 2011-12-08 DIAGNOSIS — M25559 Pain in unspecified hip: Secondary | ICD-10-CM | POA: Diagnosis not present

## 2011-12-08 DIAGNOSIS — M76899 Other specified enthesopathies of unspecified lower limb, excluding foot: Secondary | ICD-10-CM | POA: Diagnosis not present

## 2011-12-15 DIAGNOSIS — M25559 Pain in unspecified hip: Secondary | ICD-10-CM | POA: Diagnosis not present

## 2011-12-22 DIAGNOSIS — M25559 Pain in unspecified hip: Secondary | ICD-10-CM | POA: Diagnosis not present

## 2012-01-10 ENCOUNTER — Encounter (HOSPITAL_COMMUNITY): Payer: Self-pay | Admitting: Pharmacist

## 2012-01-12 ENCOUNTER — Encounter (HOSPITAL_COMMUNITY)
Admission: RE | Admit: 2012-01-12 | Discharge: 2012-01-12 | Disposition: A | Discharge status: Home / Self Care | Payer: BC Managed Care – PPO | Source: Ambulatory Visit | Attending: Orthopedic Surgery | Admitting: Orthopedic Surgery

## 2012-01-12 ENCOUNTER — Encounter (HOSPITAL_COMMUNITY): Payer: Self-pay

## 2012-01-12 DIAGNOSIS — I10 Essential (primary) hypertension: Secondary | ICD-10-CM | POA: Diagnosis present

## 2012-01-12 DIAGNOSIS — S79919A Unspecified injury of unspecified hip, initial encounter: Secondary | ICD-10-CM | POA: Diagnosis not present

## 2012-01-12 DIAGNOSIS — M6250 Muscle wasting and atrophy, not elsewhere classified, unspecified site: Secondary | ICD-10-CM | POA: Diagnosis not present

## 2012-01-12 DIAGNOSIS — M25559 Pain in unspecified hip: Secondary | ICD-10-CM | POA: Diagnosis not present

## 2012-01-12 DIAGNOSIS — M76899 Other specified enthesopathies of unspecified lower limb, excluding foot: Secondary | ICD-10-CM | POA: Diagnosis not present

## 2012-01-12 DIAGNOSIS — F3289 Other specified depressive episodes: Secondary | ICD-10-CM | POA: Diagnosis present

## 2012-01-12 DIAGNOSIS — Z87891 Personal history of nicotine dependence: Secondary | ICD-10-CM | POA: Diagnosis not present

## 2012-01-12 DIAGNOSIS — IMO0002 Reserved for concepts with insufficient information to code with codable children: Secondary | ICD-10-CM | POA: Diagnosis not present

## 2012-01-12 DIAGNOSIS — K219 Gastro-esophageal reflux disease without esophagitis: Secondary | ICD-10-CM | POA: Diagnosis present

## 2012-01-12 DIAGNOSIS — S79929A Unspecified injury of unspecified thigh, initial encounter: Secondary | ICD-10-CM | POA: Diagnosis not present

## 2012-01-12 DIAGNOSIS — M1610 Unilateral primary osteoarthritis, unspecified hip: Secondary | ICD-10-CM | POA: Diagnosis present

## 2012-01-12 DIAGNOSIS — Z880 Allergy status to penicillin: Secondary | ICD-10-CM | POA: Diagnosis not present

## 2012-01-12 DIAGNOSIS — Z885 Allergy status to narcotic agent status: Secondary | ICD-10-CM | POA: Diagnosis not present

## 2012-01-12 DIAGNOSIS — M069 Rheumatoid arthritis, unspecified: Secondary | ICD-10-CM | POA: Diagnosis present

## 2012-01-12 DIAGNOSIS — Z6841 Body Mass Index (BMI) 40.0 and over, adult: Secondary | ICD-10-CM | POA: Diagnosis not present

## 2012-01-12 DIAGNOSIS — F329 Major depressive disorder, single episode, unspecified: Secondary | ICD-10-CM | POA: Diagnosis present

## 2012-01-12 DIAGNOSIS — E785 Hyperlipidemia, unspecified: Secondary | ICD-10-CM | POA: Diagnosis present

## 2012-01-12 DIAGNOSIS — M79609 Pain in unspecified limb: Secondary | ICD-10-CM | POA: Diagnosis not present

## 2012-01-12 DIAGNOSIS — IMO0001 Reserved for inherently not codable concepts without codable children: Secondary | ICD-10-CM | POA: Diagnosis present

## 2012-01-12 DIAGNOSIS — Z7982 Long term (current) use of aspirin: Secondary | ICD-10-CM | POA: Diagnosis not present

## 2012-01-12 HISTORY — DX: Fibromyalgia: M79.7

## 2012-01-12 LAB — URINALYSIS, ROUTINE W REFLEX MICROSCOPIC
Bilirubin Urine: NEGATIVE
Glucose, UA: NEGATIVE mg/dL
Ketones, ur: NEGATIVE mg/dL
Leukocytes, UA: NEGATIVE
Nitrite: NEGATIVE
Protein, ur: NEGATIVE mg/dL
Specific Gravity, Urine: 1.022 (ref 1.005–1.030)
Urobilinogen, UA: 0.2 mg/dL (ref 0.0–1.0)
pH: 5 (ref 5.0–8.0)

## 2012-01-12 LAB — BASIC METABOLIC PANEL
BUN: 21 mg/dL (ref 6–23)
CO2: 29 mEq/L (ref 19–32)
Calcium: 9.5 mg/dL (ref 8.4–10.5)
Chloride: 99 mEq/L (ref 96–112)
Creatinine, Ser: 0.58 mg/dL (ref 0.50–1.10)
GFR calc Af Amer: >90 mL/min (ref >90)
GFR calc non Af Amer: >90 mL/min (ref >90)
Glucose, Bld: 97 mg/dL (ref 70–99)
Potassium: 3.5 mEq/L (ref 3.5–5.1)
Sodium: 138 mEq/L (ref 135–145)

## 2012-01-12 LAB — CBC
HCT: 39.8 % (ref 36.0–46.0)
Hemoglobin: 13.2 g/dL (ref 12.0–15.0)
MCH: 29.9 pg (ref 26.0–34.0)
MCHC: 33.2 g/dL (ref 30.0–36.0)
MCV: 90 fL (ref 78.0–100.0)
Platelets: 375 10*3/uL (ref 150–400)
RBC: 4.42 MIL/uL (ref 3.87–5.11)
RDW: 14.3 % (ref 11.5–15.5)
WBC: 12.8 10*3/uL — ABNORMAL HIGH (ref 4.0–10.5)

## 2012-01-12 LAB — SURGICAL PCR SCREEN
MRSA, PCR: NEGATIVE
Staphylococcus aureus: NEGATIVE

## 2012-01-12 LAB — URINE MICROSCOPIC-ADD ON

## 2012-01-12 LAB — APTT: aPTT: 29 seconds (ref 24–37)

## 2012-01-12 LAB — PROTIME-INR
INR: 0.9 (ref 0.00–1.49)
Prothrombin Time: 12.1 seconds (ref 11.6–15.2)

## 2012-01-12 NOTE — Pre-Procedure Instructions (Signed)
CBC, BMET, PT, PTT, UA, T/S, EKG WERE DONE TODAY - PREOP - AT [facility] AS PER ORDERS DR. OLIN AND ANESTHESIOLOGIST'S GUIDELINES.  PT HAS CXR IN EPIC FROM 08/03/11.

## 2012-01-12 NOTE — H&P (Signed)
[name] is an 56 y.o. female.    Chief Complaint:   Right hip pain / gluteal tendon tear  HPI: Pt is a 56 y.o. female complaining of right hip pain for  6+ months. Pain had continually increased since the beginning. She denies any trauma or initiating event. Pain started spontaneously and has been increasing since the beginning.  X-rays in the clinic show some arthritic changes of the right hip, but no acute bony abnormalities.  An MRI reveled marked partial tearing of the distal right gluteus minimus tendon with mild underlying bursitis and moderate muscle atrophy.  Pt has tried various conservative treatments which have failed to alleviate their symptoms, including steroid injections and NSAIDs. Various options are discussed with the patient. Risks, benefits and expectations were discussed with the patient. Patient understand the risks, benefits and expectations and wishes to proceed with surgery.   PCP:  Kristian Covey, MD  D/C Plans:  Home with HHPT  Post-op Meds:  Rx given for ASA, Robaxin, Iron, Colace and MiraLax  Tranexamic Acid:   To be given  Decadron:   To be given  FYI: She states that she doesn't do well with hydrocodone, but has done ok with Oxycodone in the past  PMH: Past Medical History  Diagnosis Date  . ANGIOMA 10/03/2008    REMOVED FROM RIGHT LOWER LEG-BENIGN  . HYPERLIPIDEMIA 08/27/2008  . DEPRESSION 12/10/2009  . HYPERTENSION 08/27/2008  . GERD 08/27/2008  . SEBORRHEIC KERATOSIS 08/27/2008  . Arthritis     RA  . Bronchitis     SEASONAL  . Hyperlipemia   . Fibromyalgia     PSH: Past Surgical History  Procedure Date  . Cesarean section 1980  . Cesarean section 1982  . Cholecystectomy 1997  . Abdominal hysterectomy 1995  . Foot surgery 2004, 2005, 2008  . Knee arthroscopy 2007, 2208, 2009  . Nasal sinus surgery 2013  . Joint replacement 10,12    lt total knee  . Breast ductal system excision 08/16/2011    Procedure: EXCISION DUCTAL SYSTEM  BREAST;  Surgeon: Clovis Pu. Cornett, MD;  Location: [facility];  Service: General;  Laterality: Left;  left breast duct excision    Social History:  reports that she quit smoking about 21 years ago. Her smoking use included Cigarettes. She has a 2.5 pack-year smoking history. She does not have any smokeless tobacco history on file. She reports that she does not drink alcohol or use illicit drugs.  Allergies:  Allergies  Allergen Reactions  . Codeine Sulfate     REACTION: GI upset  . Penicillins     REACTION: rash    Medications: No current facility-administered medications for this encounter.   Current Outpatient Prescriptions  Medication Sig Dispense Refill  . methotrexate 25 MG/ML injection Inject 25 mg into the skin once a week. On Sundays. Confirmed 25mg /ml concentration with the pharmacy. Patient draws up and injects 1ml once a week.      Marland Kitchen acetaminophen (TYLENOL ARTHRITIS PAIN) 650 MG CR tablet Take 1,300 mg by mouth every 8 (eight) hours as needed. pain      . aspirin EC 81 MG tablet Take 81 mg by mouth daily.      Marland Kitchen estradiol (ESTRACE) 1 MG tablet TAKE 1 TABLET (1 MG TOTAL) DAILY  90 tablet  3  . etanercept (ENBREL) 50 MG/ML injection Inject 50 mg into the skin once a week. Sundays.      . fluticasone (FLONASE) 50 MCG/ACT  nasal spray Place 2 sprays into the nose daily.      . folic acid (FOLVITE) 1 MG tablet Take 2 mg by mouth daily.       . hydrochlorothiazide (HYDRODIURIL) 25 MG tablet Take 25 mg by mouth daily.      Marland Kitchen lisinopril (PRINIVIL,ZESTRIL) 20 MG tablet Take 20 mg by mouth daily.      Marland Kitchen omeprazole (PRILOSEC) 20 MG capsule Take 20 mg by mouth 2 (two) times daily.      Marland Kitchen oxyCODONE-acetaminophen (PERCOCET) 5-325 MG per tablet Take 1 tablet by mouth every 6 (six) hours as needed. pain      . predniSONE (DELTASONE) 20 MG tablet Take 1 tablet (20 mg total) by mouth 2 (two) times daily.  12 tablet  0  . predniSONE (DELTASONE) 5 MG tablet Take 5 mg by mouth  daily.       . sertraline (ZOLOFT) 50 MG tablet Take 50 mg by mouth daily.      . simvastatin (ZOCOR) 20 MG tablet Take 20 mg by mouth at bedtime.      Marland Kitchen zolpidem (AMBIEN) 10 MG tablet Take 10 mg by mouth at bedtime as needed. Insomnia        Results for orders placed during the hospital encounter of 01/12/12 (from the past 48 hour(s))  SURGICAL PCR SCREEN     Status: Normal   Collection Time   01/12/12 12:51 PM      Component Value Range Comment   MRSA, PCR NEGATIVE  NEGATIVE    Staphylococcus aureus NEGATIVE  NEGATIVE   URINALYSIS, ROUTINE W REFLEX MICROSCOPIC     Status: Abnormal   Collection Time   01/12/12 12:52 PM      Component Value Range Comment   Color, Urine YELLOW  YELLOW    APPearance CLEAR  CLEAR    Specific Gravity, Urine 1.022  1.005 - 1.030    pH 5.0  5.0 - 8.0    Glucose, UA NEGATIVE  NEGATIVE mg/dL    Hgb urine dipstick MODERATE (*) NEGATIVE    Bilirubin Urine NEGATIVE  NEGATIVE    Ketones, ur NEGATIVE  NEGATIVE mg/dL    Protein, ur NEGATIVE  NEGATIVE mg/dL    Urobilinogen, UA 0.2  0.0 - 1.0 mg/dL    Nitrite NEGATIVE  NEGATIVE    Leukocytes, UA NEGATIVE  NEGATIVE   URINE MICROSCOPIC-ADD ON     Status: Normal   Collection Time   01/12/12 12:52 PM      Component Value Range Comment   Squamous Epithelial / LPF RARE  RARE    WBC, UA 0-2  <3 WBC/hpf    RBC / HPF 3-6  <3 RBC/hpf    Urine-Other MUCOUS PRESENT     APTT     Status: Normal   Collection Time   01/12/12  1:25 PM      Component Value Range Comment   aPTT 29  24 - 37 seconds   BASIC METABOLIC PANEL     Status: Normal   Collection Time   01/12/12  1:25 PM      Component Value Range Comment   Sodium 138  135 - 145 mEq/L    Potassium 3.5  3.5 - 5.1 mEq/L    Chloride 99  96 - 112 mEq/L    CO2 29  19 - 32 mEq/L    Glucose, Bld 97  70 - 99 mg/dL    BUN 21  6 - 23 mg/dL  Creatinine, Ser 0.58  0.50 - 1.10 mg/dL    Calcium 9.5  8.4 - 11.9 mg/dL    GFR calc non Af Amer >90  >90 mL/min    GFR calc  Af Amer >90  >90 mL/min   CBC     Status: Abnormal   Collection Time   01/12/12  1:25 PM      Component Value Range Comment   WBC 12.8 (*) 4.0 - 10.5 K/uL    RBC 4.42  3.87 - 5.11 MIL/uL    Hemoglobin 13.2  12.0 - 15.0 g/dL    HCT 14.7  82.9 - 56.2 %    MCV 90.0  78.0 - 100.0 fL    MCH 29.9  26.0 - 34.0 pg    MCHC 33.2  30.0 - 36.0 g/dL    RDW 13.0  86.5 - 78.4 %    Platelets 375  150 - 400 K/uL   PROTIME-INR     Status: Normal   Collection Time   01/12/12  1:25 PM      Component Value Range Comment   Prothrombin Time 12.1  11.6 - 15.2 seconds    INR 0.90  0.00 - 1.49   TYPE AND SCREEN     Status: Normal (Preliminary result)   Collection Time   01/12/12  1:25 PM      Component Value Range Comment   ABO/RH(D) B POS      Antibody Screen PENDING      Sample Expiration 01/26/2012        ROS: Review of Systems  Constitutional: Negative.   HENT: Negative.   Eyes: Negative.   Respiratory: Negative.   Cardiovascular: Negative.   Genitourinary: Negative.   Musculoskeletal: Positive for joint pain.  Skin: Negative.   Neurological: Negative.   Endo/Heme/Allergies: Negative.   Psychiatric/Behavioral: Negative.      Physical Exam: BP:   138/80  ;  HR:   80  ; Resp:   16  : Physical Exam  Constitutional: She is oriented to person, place, and time and well-developed, well-nourished, and in no distress.  HENT:  Head: Normocephalic and atraumatic.  Nose: Nose normal.  Mouth/Throat: Oropharynx is clear and moist.  Eyes: Pupils are equal, round, and reactive to light.  Neck: Neck supple. No JVD present. No tracheal deviation present. No thyromegaly present.  Cardiovascular: Normal rate, regular rhythm and intact distal pulses.   Pulmonary/Chest: Effort normal and breath sounds normal. No stridor. No respiratory distress. She has no wheezes.  Abdominal: Soft. There is no tenderness. There is no guarding.  Musculoskeletal:       Right hip: She exhibits decreased range of motion,  decreased strength, tenderness, bony tenderness and swelling. She exhibits no crepitus, no deformity and no laceration.  Lymphadenopathy:    She has no cervical adenopathy.  Neurological: She is alert and oriented to person, place, and time.  Skin: Skin is warm and dry.  Psychiatric: Affect normal.      Assessment/Plan Assessment:     Right hip pain / gluteal tendon tear   Plan: Patient will undergo a open repair of the right gluteal tendon tear on 01/17/2012 per Dr. Charlann Boxer at [facility]. Risks benefits and expectations were discussed with the patient. Patient understand risks, benefits and expectations and wishes to proceed.   Anastasio Auerbach Jovana Rembold   PAC  01/12/2012, 5:37 PM

## 2012-01-12 NOTE — Patient Instructions (Signed)
YOUR SURGERY IS SCHEDULED AT [facility]  ON:   Monday  11/25  AT 12:20 PM  REPORT TO [facility] SHORT STAY CENTER AT:  9:45 AM      PHONE # FOR SHORT STAY IS [phone]  DO NOT EAT ANYTHING AFTER MIDNIGHT THE NIGHT BEFORE YOUR SURGERY.  NO FOOD, NO CHEWING GUM, NO MINTS, NO CANDIES, NO CHEWING TOBACCO. YOU MAY HAVE CLEAR LIQUIDS TO DRINK FROM MIDNIGHT UNTIL 6:15 AM DAY OF SURGERY - LIKE WATER, BLACK COFFEE.  NOTHING TO DRINK AFTER 6:15 AM DAY OF SURGERY.  PLEASE TAKE THE FOLLOWING MEDICATIONS THE AM OF YOUR SURGERY WITH A FEW SIPS OF WATER:   ESTRACE, PREDNISONE, FLONASE, OMEPRAZOLE, SERTRALINE (ZOLOFT).    IF YOU USE INHALERS--USE YOUR INHALERS THE AM OF YOUR SURGERY AND BRING INHALERS TO THE HOSPITAL -TAKE TO SURGERY.    IF YOU ARE DIABETIC:  DO NOT TAKE ANY DIABETIC MEDICATIONS THE AM OF YOUR SURGERY.  IF YOU TAKE INSULIN IN THE EVENINGS--PLEASE ONLY TAKE 1/2 NORMAL EVENING DOSE THE NIGHT BEFORE YOUR SURGERY.  NO INSULIN THE AM OF YOUR SURGERY.  IF YOU HAVE SLEEP APNEA AND USE CPAP OR BIPAP--PLEASE BRING THE MASK AND THE TUBING.  DO NOT BRING YOUR MACHINE.  DO NOT BRING VALUABLES, MONEY, CREDIT CARDS.  DO NOT WEAR JEWELRY, MAKE-UP, NAIL POLISH AND NO METAL PINS OR CLIPS IN YOUR HAIR. CONTACT LENS, DENTURES / PARTIALS, GLASSES SHOULD NOT BE WORN TO SURGERY AND IN MOST CASES-HEARING AIDS WILL NEED TO BE REMOVED.  BRING YOUR GLASSES CASE, ANY EQUIPMENT NEEDED FOR YOUR CONTACT LENS. FOR PATIENTS ADMITTED TO THE HOSPITAL--CHECK OUT TIME THE DAY OF DISCHARGE IS 11:00 AM.  ALL INPATIENT ROOMS ARE PRIVATE - WITH BATHROOM, TELEPHONE, TELEVISION AND WIFI INTERNET.  IF YOU ARE BEING DISCHARGED THE SAME DAY OF YOUR SURGERY--YOU CAN NOT DRIVE YOURSELF HOME--AND SHOULD NOT GO HOME ALONE BY TAXI OR BUS.  NO DRIVING OR OPERATING MACHINERY FOR 24 HOURS FOLLOWING ANESTHESIA / PAIN MEDICATIONS.  PLEASE MAKE ARRANGEMENTS FOR SOMEONE TO BE WITH YOU AT HOME THE FIRST 24 HOURS AFTER  SURGERY. RESPONSIBLE DRIVER'S NAME___________________________                                               PHONE #   _______________________                                  PLEASE READ OVER ANY  FACT SHEETS THAT YOU WERE GIVEN: MRSA INFORMATION, BLOOD TRANSFUSION INFORMATION, INCENTIVE SPIROMETER INFORMATION.

## 2012-01-16 MED ORDER — CLINDAMYCIN PHOSPHATE 900 MG/50ML IV SOLN
900.0000 mg | INTRAVENOUS | Status: AC
  Administered 2012-01-17: 900 mg via INTRAVENOUS
  Filled 2012-01-16: qty 50

## 2012-01-17 ENCOUNTER — Ambulatory Visit (HOSPITAL_COMMUNITY): Payer: BC Managed Care – PPO | Admitting: Anesthesiology

## 2012-01-17 ENCOUNTER — Encounter (HOSPITAL_COMMUNITY): Payer: Self-pay | Admitting: *Deleted

## 2012-01-17 ENCOUNTER — Encounter (HOSPITAL_COMMUNITY)
Admission: RE | Disposition: A | Discharge status: Home Health | Payer: Self-pay | Source: Ambulatory Visit | Attending: Orthopedic Surgery

## 2012-01-17 ENCOUNTER — Encounter (HOSPITAL_COMMUNITY): Payer: Self-pay | Admitting: Anesthesiology

## 2012-01-17 ENCOUNTER — Inpatient Hospital Stay (HOSPITAL_COMMUNITY)
Admission: RE | Admit: 2012-01-17 | Discharge: 2012-01-19 | DRG: 227 | Disposition: A | Discharge status: Home Health | Payer: BC Managed Care – PPO | Source: Ambulatory Visit | Attending: Orthopedic Surgery | Admitting: Orthopedic Surgery

## 2012-01-17 DIAGNOSIS — M67952 Unspecified disorder of synovium and tendon, left thigh: Secondary | ICD-10-CM

## 2012-01-17 DIAGNOSIS — K219 Gastro-esophageal reflux disease without esophagitis: Secondary | ICD-10-CM | POA: Diagnosis present

## 2012-01-17 DIAGNOSIS — M069 Rheumatoid arthritis, unspecified: Secondary | ICD-10-CM | POA: Diagnosis present

## 2012-01-17 DIAGNOSIS — Y929 Unspecified place or not applicable: Secondary | ICD-10-CM

## 2012-01-17 DIAGNOSIS — M1610 Unilateral primary osteoarthritis, unspecified hip: Secondary | ICD-10-CM | POA: Diagnosis present

## 2012-01-17 DIAGNOSIS — X58XXXA Exposure to other specified factors, initial encounter: Secondary | ICD-10-CM | POA: Diagnosis present

## 2012-01-17 DIAGNOSIS — Z885 Allergy status to narcotic agent status: Secondary | ICD-10-CM

## 2012-01-17 DIAGNOSIS — Z7982 Long term (current) use of aspirin: Secondary | ICD-10-CM

## 2012-01-17 DIAGNOSIS — IMO0002 Reserved for concepts with insufficient information to code with codable children: Principal | ICD-10-CM | POA: Diagnosis present

## 2012-01-17 DIAGNOSIS — E785 Hyperlipidemia, unspecified: Secondary | ICD-10-CM | POA: Diagnosis present

## 2012-01-17 DIAGNOSIS — Z6841 Body Mass Index (BMI) 40.0 and over, adult: Secondary | ICD-10-CM

## 2012-01-17 DIAGNOSIS — F3289 Other specified depressive episodes: Secondary | ICD-10-CM | POA: Diagnosis present

## 2012-01-17 DIAGNOSIS — I10 Essential (primary) hypertension: Secondary | ICD-10-CM | POA: Diagnosis present

## 2012-01-17 DIAGNOSIS — Z880 Allergy status to penicillin: Secondary | ICD-10-CM

## 2012-01-17 DIAGNOSIS — Z87891 Personal history of nicotine dependence: Secondary | ICD-10-CM

## 2012-01-17 DIAGNOSIS — F329 Major depressive disorder, single episode, unspecified: Secondary | ICD-10-CM | POA: Diagnosis present

## 2012-01-17 DIAGNOSIS — M6250 Muscle wasting and atrophy, not elsewhere classified, unspecified site: Secondary | ICD-10-CM | POA: Diagnosis present

## 2012-01-17 DIAGNOSIS — M76899 Other specified enthesopathies of unspecified lower limb, excluding foot: Secondary | ICD-10-CM | POA: Diagnosis present

## 2012-01-17 DIAGNOSIS — IMO0001 Reserved for inherently not codable concepts without codable children: Secondary | ICD-10-CM | POA: Diagnosis present

## 2012-01-17 HISTORY — PX: OPEN SURGICAL REPAIR OF GLUTEAL TENDON: SHX5995

## 2012-01-17 LAB — TYPE AND SCREEN
ABO/RH(D): B POS
Antibody Screen: NEGATIVE

## 2012-01-17 SURGERY — REPAIR, TENDON, GLUTEUS MEDIUS, OPEN
Anesthesia: General | Site: Hip | Laterality: Right | Wound class: Clean

## 2012-01-17 MED ORDER — KETOROLAC TROMETHAMINE 30 MG/ML IJ SOLN: 15.0000 mg | Freq: Once | INTRAMUSCULAR | Status: DC | PRN

## 2012-01-17 MED ORDER — ZOLPIDEM TARTRATE 5 MG PO TABS: 5.0000 mg | ORAL_TABLET | Freq: Every evening | ORAL | Status: DC | PRN

## 2012-01-17 MED ORDER — METHOCARBAMOL 100 MG/ML IJ SOLN
500.0000 mg | Freq: Four times a day (QID) | INTRAVENOUS | Status: DC | PRN
  Administered 2012-01-17 – 2012-01-18 (×3): 500 mg via INTRAVENOUS
  Filled 2012-01-17 (×4): qty 5

## 2012-01-17 MED ORDER — PANTOPRAZOLE SODIUM 40 MG PO TBEC
40.0000 mg | DELAYED_RELEASE_TABLET | Freq: Two times a day (BID) | ORAL | Status: DC
  Administered 2012-01-17 – 2012-01-19 (×5): 40 mg via ORAL
  Filled 2012-01-17 (×7): qty 1

## 2012-01-17 MED ORDER — DIPHENHYDRAMINE HCL 25 MG PO CAPS: 25.0000 mg | ORAL_CAPSULE | Freq: Four times a day (QID) | ORAL | Status: DC | PRN

## 2012-01-17 MED ORDER — NEOSTIGMINE METHYLSULFATE 1 MG/ML IJ SOLN
INTRAMUSCULAR | Status: DC | PRN
  Administered 2012-01-17: 5 mg via INTRAVENOUS

## 2012-01-17 MED ORDER — LACTATED RINGERS IV SOLN
INTRAVENOUS | Status: DC
  Administered 2012-01-17: 1000 mL via INTRAVENOUS
  Administered 2012-01-17: 13:00:00 via INTRAVENOUS

## 2012-01-17 MED ORDER — SERTRALINE HCL 50 MG PO TABS
50.0000 mg | ORAL_TABLET | Freq: Every day | ORAL | Status: DC
  Administered 2012-01-18 – 2012-01-19 (×2): 50 mg via ORAL
  Filled 2012-01-17 (×2): qty 1

## 2012-01-17 MED ORDER — SODIUM CHLORIDE 0.9 % IV SOLN
100.0000 mL/h | INTRAVENOUS | Status: DC
  Administered 2012-01-17 – 2012-01-18 (×2): 100 mL/h via INTRAVENOUS
  Filled 2012-01-17 (×7): qty 1000

## 2012-01-17 MED ORDER — SIMVASTATIN 20 MG PO TABS
20.0000 mg | ORAL_TABLET | Freq: Every day | ORAL | Status: DC
  Administered 2012-01-17 – 2012-01-18 (×2): 20 mg via ORAL
  Filled 2012-01-17 (×3): qty 1

## 2012-01-17 MED ORDER — PROPOFOL 10 MG/ML IV BOLUS
INTRAVENOUS | Status: DC | PRN
  Administered 2012-01-17: 180 mg via INTRAVENOUS

## 2012-01-17 MED ORDER — GLYCOPYRROLATE 0.2 MG/ML IJ SOLN
INTRAMUSCULAR | Status: DC | PRN
  Administered 2012-01-17: .9 mg via INTRAVENOUS

## 2012-01-17 MED ORDER — METOCLOPRAMIDE HCL 10 MG PO TABS: 5.0000 mg | ORAL_TABLET | Freq: Three times a day (TID) | ORAL | Status: DC | PRN

## 2012-01-17 MED ORDER — ACETAMINOPHEN 10 MG/ML IV SOLN
INTRAVENOUS | Status: DC | PRN
  Administered 2012-01-17: 1000 mg via INTRAVENOUS

## 2012-01-17 MED ORDER — CHLORHEXIDINE GLUCONATE 4 % EX LIQD
60.0000 mL | Freq: Once | CUTANEOUS | Status: DC
  Filled 2012-01-17: qty 60

## 2012-01-17 MED ORDER — BISACODYL 10 MG RE SUPP: 10.0000 mg | Freq: Every day | RECTAL | Status: DC | PRN

## 2012-01-17 MED ORDER — HYDROMORPHONE HCL PF 1 MG/ML IJ SOLN
0.5000 mg | INTRAMUSCULAR | Status: DC | PRN
  Administered 2012-01-17: 2 mg via INTRAVENOUS
  Administered 2012-01-17: 1 mg via INTRAVENOUS
  Administered 2012-01-17: 2 mg via INTRAVENOUS
  Administered 2012-01-18 (×2): 1 mg via INTRAVENOUS
  Filled 2012-01-17 (×3): qty 1
  Filled 2012-01-17: qty 2
  Filled 2012-01-17 (×2): qty 1

## 2012-01-17 MED ORDER — CLINDAMYCIN PHOSPHATE 900 MG/50ML IV SOLN
INTRAVENOUS | Status: AC
  Filled 2012-01-17: qty 50

## 2012-01-17 MED ORDER — DEXAMETHASONE SODIUM PHOSPHATE 10 MG/ML IJ SOLN
10.0000 mg | Freq: Once | INTRAMUSCULAR | Status: DC
  Filled 2012-01-17: qty 1

## 2012-01-17 MED ORDER — ACETAMINOPHEN 10 MG/ML IV SOLN
INTRAVENOUS | Status: AC
  Filled 2012-01-17: qty 100

## 2012-01-17 MED ORDER — ROCURONIUM BROMIDE 100 MG/10ML IV SOLN
INTRAVENOUS | Status: DC | PRN
  Administered 2012-01-17: 10 mg via INTRAVENOUS
  Administered 2012-01-17: 40 mg via INTRAVENOUS
  Administered 2012-01-17: 10 mg via INTRAVENOUS

## 2012-01-17 MED ORDER — HYDROMORPHONE HCL PF 1 MG/ML IJ SOLN
INTRAMUSCULAR | Status: AC
  Filled 2012-01-17: qty 1

## 2012-01-17 MED ORDER — ONDANSETRON HCL 4 MG/2ML IJ SOLN
4.0000 mg | Freq: Four times a day (QID) | INTRAMUSCULAR | Status: DC | PRN
  Administered 2012-01-18: 4 mg via INTRAVENOUS
  Filled 2012-01-17: qty 2

## 2012-01-17 MED ORDER — MENTHOL 3 MG MT LOZG
1.0000 | LOZENGE | OROMUCOSAL | Status: DC | PRN
  Filled 2012-01-17: qty 9

## 2012-01-17 MED ORDER — ALUM & MAG HYDROXIDE-SIMETH 200-200-20 MG/5ML PO SUSP: 30.0000 mL | ORAL | Status: DC | PRN

## 2012-01-17 MED ORDER — PROMETHAZINE HCL 25 MG/ML IJ SOLN: 6.2500 mg | INTRAMUSCULAR | Status: DC | PRN

## 2012-01-17 MED ORDER — METHOCARBAMOL 500 MG PO TABS
500.0000 mg | ORAL_TABLET | Freq: Four times a day (QID) | ORAL | Status: DC | PRN
  Administered 2012-01-18 – 2012-01-19 (×3): 500 mg via ORAL
  Filled 2012-01-17 (×3): qty 1

## 2012-01-17 MED ORDER — CELECOXIB 200 MG PO CAPS
200.0000 mg | ORAL_CAPSULE | Freq: Two times a day (BID) | ORAL | Status: DC
  Administered 2012-01-17 – 2012-01-19 (×4): 200 mg via ORAL
  Filled 2012-01-17 (×5): qty 1

## 2012-01-17 MED ORDER — DEXAMETHASONE SODIUM PHOSPHATE 10 MG/ML IJ SOLN
INTRAMUSCULAR | Status: DC | PRN
  Administered 2012-01-17: 10 mg via INTRAVENOUS

## 2012-01-17 MED ORDER — PREDNISONE 5 MG PO TABS
5.0000 mg | ORAL_TABLET | Freq: Every day | ORAL | Status: DC
  Administered 2012-01-18 – 2012-01-19 (×2): 5 mg via ORAL
  Filled 2012-01-17 (×3): qty 1

## 2012-01-17 MED ORDER — DOCUSATE SODIUM 100 MG PO CAPS
100.0000 mg | ORAL_CAPSULE | Freq: Two times a day (BID) | ORAL | Status: DC
  Administered 2012-01-17 – 2012-01-19 (×4): 100 mg via ORAL

## 2012-01-17 MED ORDER — LIDOCAINE HCL (CARDIAC) 20 MG/ML IV SOLN
INTRAVENOUS | Status: DC | PRN
  Administered 2012-01-17: 50 mg via INTRAVENOUS

## 2012-01-17 MED ORDER — MIDAZOLAM HCL 5 MG/5ML IJ SOLN
INTRAMUSCULAR | Status: DC | PRN
  Administered 2012-01-17: 2 mg via INTRAVENOUS

## 2012-01-17 MED ORDER — RIVAROXABAN 10 MG PO TABS
10.0000 mg | ORAL_TABLET | ORAL | Status: DC
  Administered 2012-01-18 – 2012-01-19 (×2): 10 mg via ORAL
  Filled 2012-01-17 (×3): qty 1

## 2012-01-17 MED ORDER — ONDANSETRON HCL 4 MG/2ML IJ SOLN
INTRAMUSCULAR | Status: DC | PRN
  Administered 2012-01-17: 4 mg via INTRAVENOUS

## 2012-01-17 MED ORDER — FLEET ENEMA 7-19 GM/118ML RE ENEM: 1.0000 | ENEMA | Freq: Once | RECTAL | Status: AC | PRN

## 2012-01-17 MED ORDER — ONDANSETRON HCL 4 MG PO TABS: 4.0000 mg | ORAL_TABLET | Freq: Four times a day (QID) | ORAL | Status: DC | PRN

## 2012-01-17 MED ORDER — PHENOL 1.4 % MT LIQD: 1.0000 | OROMUCOSAL | Status: DC | PRN

## 2012-01-17 MED ORDER — ESTRADIOL 1 MG PO TABS
1.0000 mg | ORAL_TABLET | Freq: Every day | ORAL | Status: DC
  Administered 2012-01-17 – 2012-01-19 (×3): 1 mg via ORAL
  Filled 2012-01-17 (×3): qty 1

## 2012-01-17 MED ORDER — HYDROMORPHONE HCL PF 1 MG/ML IJ SOLN
0.2500 mg | INTRAMUSCULAR | Status: DC | PRN
  Administered 2012-01-17 (×4): 0.5 mg via INTRAVENOUS

## 2012-01-17 MED ORDER — FLUTICASONE PROPIONATE 50 MCG/ACT NA SUSP
2.0000 | Freq: Every day | NASAL | Status: DC
  Administered 2012-01-17 – 2012-01-19 (×3): 2 via NASAL
  Filled 2012-01-17: qty 16

## 2012-01-17 MED ORDER — POLYETHYLENE GLYCOL 3350 17 G PO PACK
17.0000 g | PACK | Freq: Two times a day (BID) | ORAL | Status: DC
  Administered 2012-01-17 – 2012-01-19 (×3): 17 g via ORAL

## 2012-01-17 MED ORDER — FERROUS SULFATE 325 (65 FE) MG PO TABS
325.0000 mg | ORAL_TABLET | Freq: Three times a day (TID) | ORAL | Status: DC
  Administered 2012-01-17 – 2012-01-19 (×5): 325 mg via ORAL
  Filled 2012-01-17 (×8): qty 1

## 2012-01-17 MED ORDER — CLINDAMYCIN PHOSPHATE 600 MG/50ML IV SOLN
600.0000 mg | Freq: Four times a day (QID) | INTRAVENOUS | Status: AC
  Administered 2012-01-17 (×2): 600 mg via INTRAVENOUS
  Filled 2012-01-17 (×2): qty 50

## 2012-01-17 MED ORDER — OXYCODONE HCL 5 MG PO TABS
5.0000 mg | ORAL_TABLET | ORAL | Status: DC
  Administered 2012-01-17: 5 mg via ORAL
  Administered 2012-01-17: 10 mg via ORAL
  Administered 2012-01-17: 5 mg via ORAL
  Administered 2012-01-18 – 2012-01-19 (×9): 10 mg via ORAL
  Filled 2012-01-17 (×7): qty 2
  Filled 2012-01-17 (×2): qty 1
  Filled 2012-01-17 (×3): qty 2

## 2012-01-17 MED ORDER — METOCLOPRAMIDE HCL 5 MG/ML IJ SOLN: 5.0000 mg | Freq: Three times a day (TID) | INTRAMUSCULAR | Status: DC | PRN

## 2012-01-17 MED ORDER — HYDROCHLOROTHIAZIDE 25 MG PO TABS
25.0000 mg | ORAL_TABLET | Freq: Every day | ORAL | Status: DC
  Administered 2012-01-17 – 2012-01-19 (×3): 25 mg via ORAL
  Filled 2012-01-17 (×3): qty 1

## 2012-01-17 MED ORDER — FENTANYL CITRATE 0.05 MG/ML IJ SOLN
INTRAMUSCULAR | Status: DC | PRN
  Administered 2012-01-17: 100 ug via INTRAVENOUS
  Administered 2012-01-17 (×8): 50 ug via INTRAVENOUS

## 2012-01-17 MED ORDER — TRANEXAMIC ACID 100 MG/ML IV SOLN
1785.0000 mg | Freq: Once | INTRAVENOUS | Status: AC
  Administered 2012-01-17: 1790 mg via INTRAVENOUS
  Filled 2012-01-17: qty 17.85

## 2012-01-17 SURGICAL SUPPLY — 51 items
ADH SKN CLS APL DERMABOND .7 (GAUZE/BANDAGES/DRESSINGS) ×1
BAG SPEC THK2 15X12 ZIP CLS (MISCELLANEOUS) ×1
BAG ZIPLOCK 12X15 (MISCELLANEOUS) ×2 IMPLANT
BIT DRILL 2.8X128 (BIT) ×2 IMPLANT
CLOTH BEACON ORANGE TIMEOUT ST (SAFETY) ×2 IMPLANT
DERMABOND ADVANCED (GAUZE/BANDAGES/DRESSINGS) ×1
DERMABOND ADVANCED .7 DNX12 (GAUZE/BANDAGES/DRESSINGS) ×1 IMPLANT
DRAPE ORTHO SPLIT 77X108 STRL (DRAPES) ×2
DRAPE SURG 17X11 SM STRL (DRAPES) ×2 IMPLANT
DRAPE SURG ORHT 6 SPLT 77X108 (DRAPES) ×2 IMPLANT
DRAPE U-SHAPE 47X51 STRL (DRAPES) ×2 IMPLANT
DRSG AQUACEL AG ADV 3.5X14 (GAUZE/BANDAGES/DRESSINGS) ×2 IMPLANT
DRSG EMULSION OIL 3X16 NADH (GAUZE/BANDAGES/DRESSINGS) IMPLANT
DRSG MEPILEX BORDER 4X4 (GAUZE/BANDAGES/DRESSINGS) IMPLANT
DRSG MEPILEX BORDER 4X8 (GAUZE/BANDAGES/DRESSINGS) IMPLANT
DRSG PAD ABDOMINAL 8X10 ST (GAUZE/BANDAGES/DRESSINGS) IMPLANT
DURAPREP 26ML APPLICATOR (WOUND CARE) ×4 IMPLANT
ELECT REM PT RETURN 9FT ADLT (ELECTROSURGICAL) ×2
ELECTRODE REM PT RTRN 9FT ADLT (ELECTROSURGICAL) ×1 IMPLANT
GLOVE BIOGEL PI IND STRL 6.5 (GLOVE) ×2 IMPLANT
GLOVE BIOGEL PI IND STRL 7.5 (GLOVE) ×1 IMPLANT
GLOVE BIOGEL PI IND STRL 8 (GLOVE) ×1 IMPLANT
GLOVE BIOGEL PI INDICATOR 6.5 (GLOVE) ×2
GLOVE BIOGEL PI INDICATOR 7.5 (GLOVE) ×1
GLOVE BIOGEL PI INDICATOR 8 (GLOVE) ×1
GLOVE ECLIPSE 8.0 STRL XLNG CF (GLOVE) ×2 IMPLANT
GLOVE ORTHO TXT STRL SZ7.5 (GLOVE) ×6 IMPLANT
GLOVE SURG ORTHO 8.0 STRL STRW (GLOVE) IMPLANT
GLOVE SURG SS PI 6.5 STRL IVOR (GLOVE) ×4 IMPLANT
GOWN BRE IMP PREV XXLGXLNG (GOWN DISPOSABLE) ×2 IMPLANT
GOWN BRE IMP SLV SIRUS LXLNG (GOWN DISPOSABLE) ×2 IMPLANT
GOWN STRL NON-REIN LRG LVL3 (GOWN DISPOSABLE) ×4 IMPLANT
KIT BASIN OR (CUSTOM PROCEDURE TRAY) ×2 IMPLANT
MANIFOLD NEPTUNE II (INSTRUMENTS) ×2 IMPLANT
NEEDLE MA TROC 1/2 (NEEDLE) ×2 IMPLANT
NS IRRIG 1000ML POUR BTL (IV SOLUTION) ×2 IMPLANT
PACK TOTAL JOINT (CUSTOM PROCEDURE TRAY) ×2 IMPLANT
POSITIONER SURGICAL ARM (MISCELLANEOUS) ×2 IMPLANT
SPONGE GAUZE 4X4 12PLY (GAUZE/BANDAGES/DRESSINGS) IMPLANT
STAPLER VISISTAT (STAPLE) IMPLANT
SUT ETHIBOND NAB CT1 #1 30IN (SUTURE) IMPLANT
SUT FIBERWIRE #2 38 T-5 BLUE (SUTURE) ×6
SUT MNCRL AB 4-0 PS2 18 (SUTURE) ×2 IMPLANT
SUT VIC AB 1 CT1 27 (SUTURE) ×2
SUT VIC AB 1 CT1 27XBRD ANTBC (SUTURE) ×1 IMPLANT
SUT VIC AB 1 CT1 36 (SUTURE) ×4 IMPLANT
SUT VIC AB 2-0 CT1 27 (SUTURE) ×2
SUT VIC AB 2-0 CT1 TAPERPNT 27 (SUTURE) ×1 IMPLANT
SUTURE FIBERWR #2 38 T-5 BLUE (SUTURE) ×3 IMPLANT
TOWEL OR 17X26 10 PK STRL BLUE (TOWEL DISPOSABLE) ×4 IMPLANT
WATER STERILE IRR 1500ML POUR (IV SOLUTION) IMPLANT

## 2012-01-17 NOTE — Anesthesia Preprocedure Evaluation (Signed)
Anesthesia Evaluation  Patient identified by MRN, date of birth, ID band Patient awake    Reviewed: Allergy & Precautions, H&P , NPO status , Patient's Chart, lab work & pertinent test results  Airway Mallampati: II TM Distance: <3 FB Neck ROM: Full    Dental No notable dental hx.    Pulmonary shortness of breath, with exertion and lying,  breath sounds clear to auscultation  + decreased breath sounds      Cardiovascular negative cardio ROS  Rhythm:Regular Rate:Normal     Neuro/Psych negative neurological ROS  negative psych ROS   GI/Hepatic Neg liver ROS, GERD-  ,  Endo/Other  negative endocrine ROSMorbid obesity  Renal/GU negative Renal ROS  negative genitourinary   Musculoskeletal  (+) Arthritis -, Rheumatoid disorders and on steriods ,  Fibromyalgia -  Abdominal   Peds negative pediatric ROS (+)  Hematology negative hematology ROS (+)   Anesthesia Other Findings   Reproductive/Obstetrics negative OB ROS                           Anesthesia Physical Anesthesia Plan  ASA: III  Anesthesia Plan: General   Post-op Pain Management:    Induction: Intravenous  Airway Management Planned: Oral ETT  Additional Equipment:   Intra-op Plan:   Post-operative Plan:   Informed Consent: I have reviewed the patients History and Physical, chart, labs and discussed the procedure including the risks, benefits and alternatives for the proposed anesthesia with the patient or authorized representative who has indicated his/her understanding and acceptance.   Dental advisory given  Plan Discussed with: CRNA and Surgeon  Anesthesia Plan Comments:         Anesthesia Quick Evaluation

## 2012-01-17 NOTE — Interval H&P Note (Signed)
History and Physical Interval Note:  01/17/2012 11:26 AM  [name]  has presented today for surgery, with the diagnosis of Right Gluteal Medial Tear  The various methods of treatment have been discussed with the patient and family. After consideration of risks, benefits and other options for treatment, the patient has consented to  Procedure(s) (LRB) with comments: OPEN SURGICAL REPAIR OF GLUTEAL TENDON (Right) as a surgical intervention .  The patient's history has been reviewed, patient examined, no change in status, stable for surgery.  I have reviewed the patient's chart and labs.  Questions were answered to the patient's satisfaction.     Shelda Pal

## 2012-01-17 NOTE — Brief Op Note (Signed)
01/17/2012  2:31 PM  PATIENT:  Randon Goldsmith  56 y.o. female  PRE-OPERATIVE DIAGNOSIS:  Right Gluteus medius/minimus tear  POST-OPERATIVE DIAGNOSIS:   Right Gluteus medius/minimus tear  PROCEDURE:  Procedure(s) (LRB) with comments: OPEN SURGICAL REPAIR OF GLUTEAL TENDON (Right)  SURGEON:  Surgeon(s) and Role:    * Shelda Pal, MD - Primary  PHYSICIAN ASSISTANT: Lanney Gins, PA-C  ANESTHESIA:   general  EBL:  Total I/O In: 1000 [I.V.:1000] Out: 150 [Blood:150]  BLOOD ADMINISTERED:none  DRAINS: none   LOCAL MEDICATIONS USED:  NONE  SPECIMEN:  No Specimen  DISPOSITION OF SPECIMEN:  N/A  COUNTS:  YES  TOURNIQUET:  * No tourniquets in log *  DICTATION: .Other Dictation: Dictation Number [id number]  PLAN OF CARE: Admit for overnight observation  PATIENT DISPOSITION:  PACU - hemodynamically stable.   Delay start of Pharmacological VTE agent (>24hrs) due to surgical blood loss or risk of bleeding: not applicable

## 2012-01-17 NOTE — Transfer of Care (Signed)
Immediate Anesthesia Transfer of Care Note  Patient: [name]  Procedure(s) Performed: Procedure(s) (LRB) with comments: OPEN SURGICAL REPAIR OF GLUTEAL TENDON (Right)  Patient Location: PACU  Anesthesia Type:General  Level of Consciousness: awake, alert  and oriented  Airway & Oxygen Therapy: Patient Spontanous Breathing and Patient connected to face mask oxygen  Post-op Assessment: Report given to PACU RN and Post -op Vital signs reviewed and stable  Post vital signs: Reviewed and stable  Complications: No apparent anesthesia complications

## 2012-01-17 NOTE — Anesthesia Postprocedure Evaluation (Signed)
  Anesthesia Post-op Note  Patient: [name]  Procedure(s) Performed: Procedure(s) (LRB): OPEN SURGICAL REPAIR OF GLUTEAL TENDON (Right)  Patient Location: PACU  Anesthesia Type: General  Level of Consciousness: awake and alert   Airway and Oxygen Therapy: Patient Spontanous Breathing  Post-op Pain: mild  Post-op Assessment: Post-op Vital signs reviewed, Patient's Cardiovascular Status Stable, Respiratory Function Stable, Patent Airway and No signs of Nausea or vomiting  Last Vitals:  Filed Vitals:   01/17/12 1515  BP: 127/52  Pulse: 82  Temp:   Resp: 16    Post-op Vital Signs: stable   Complications: No apparent anesthesia complications

## 2012-01-18 ENCOUNTER — Encounter (HOSPITAL_COMMUNITY): Payer: Self-pay | Admitting: Orthopedic Surgery

## 2012-01-18 DIAGNOSIS — Z6841 Body Mass Index (BMI) 40.0 and over, adult: Secondary | ICD-10-CM

## 2012-01-18 LAB — BASIC METABOLIC PANEL
BUN: 19 mg/dL (ref 6–23)
CO2: 28 mEq/L (ref 19–32)
Calcium: 8.7 mg/dL (ref 8.4–10.5)
Chloride: 99 mEq/L (ref 96–112)
Creatinine, Ser: 0.63 mg/dL (ref 0.50–1.10)
GFR calc Af Amer: >90 mL/min (ref >90)
GFR calc non Af Amer: >90 mL/min (ref >90)
Glucose, Bld: 133 mg/dL — ABNORMAL HIGH (ref 70–99)
Potassium: 4 mEq/L (ref 3.5–5.1)
Sodium: 136 mEq/L (ref 135–145)

## 2012-01-18 LAB — CBC
HCT: 36.6 % (ref 36.0–46.0)
Hemoglobin: 11.8 g/dL — ABNORMAL LOW (ref 12.0–15.0)
MCH: 29.6 pg (ref 26.0–34.0)
MCHC: 32.2 g/dL (ref 30.0–36.0)
MCV: 91.7 fL (ref 78.0–100.0)
Platelets: 344 10*3/uL (ref 150–400)
RBC: 3.99 MIL/uL (ref 3.87–5.11)
RDW: 14.3 % (ref 11.5–15.5)
WBC: 16.8 10*3/uL — ABNORMAL HIGH (ref 4.0–10.5)

## 2012-01-18 NOTE — Progress Notes (Signed)
   01/18/12 1300  PT Visit Information  Last PT Received On 01/18/12  PT/OT Co-Evaluation/Treatment Yes  PT Time Calculation  PT Start Time 1324  PT Stop Time 1338  PT Time Calculation (min) 14 min  Subjective Data  Subjective I just got back to bed  Patient Stated Goal home  Precautions  Precautions Fall  Precaution Comments NO  ABDUCTION RIGHT HIP  Restrictions  RLE Weight Bearing TWB  Cognition  Overall Cognitive Status Appears within functional limits for tasks assessed/performed  Arousal/Alertness Awake/alert  Orientation Level Appears intact for tasks assessed  Behavior During Session [facility] for tasks performed  Bed Mobility  Bed Mobility Sit to Supine;Supine to Sit  Supine to Sit 4: Min assist  Sit to Supine 4: Min assist  Details for Bed Mobility Assistance cues for technique and precautions; pt with c/o of significant pain upon returning to supone; PT supporting LE throughout,and maintaining neutral hip, although pt still attempting to lift her own leg, cues to relax and decrease stress on RLE  Transfers  Transfers Sit to Stand;Stand to Sit  Sit to Stand 4: Min assist;4: Min guard;From bed  Stand to Sit 4: Min assist;To bed  Ambulation/Gait  Ambulation/Gait Assistance 4: Min assist;4: Min Air cabin crew (Feet) 20 Feet  Assistive device Rolling walker  Ambulation/Gait Assistance Details cues for TDWB and sequence  Gait Pattern Step-to pattern  PT - End of Session  Activity Tolerance Patient tolerated treatment well  Patient left in bed;with family/visitor present;with call bell/phone within reach  Nurse Communication Other (comment) (pain meds)  PT - Assessment/Plan  Comments on Treatment Session pt progressing thsi session, will need to  do stairs prior to D/C; Pt with lots of pain returning to bed wtih neutral hip maintained throughout; pt reports decreased pain after 30secs; RN notified  PT Plan Discharge plan remains appropriate;Frequency remains  appropriate  PT Frequency Min 6X/week  Follow Up Recommendations Home health PT  Equipment Recommended None recommended by PT  Acute Rehab PT Goals  Time For Goal Achievement 01/25/12  Potential to Achieve Goals Good  Pt will go Supine/Side to Sit with min assist  PT Goal: Supine/Side to Sit - Progress Progressing toward goal  Pt will go Sit to Supine/Side with min assist  PT Goal: Sit to Supine/Side - Progress Progressing toward goal  Pt will go Sit to Stand with supervision  PT Goal: Sit to Stand - Progress Progressing toward goal  Pt will go Stand to Sit with supervision  PT Goal: Stand to Sit - Progress Progressing toward goal  Pt will Ambulate 16 - 50 feet;with supervision;with rolling walker  PT Goal: Ambulate - Progress Progressing toward goal  PT General Charges  $$ ACUTE PT VISIT 1 Procedure  PT Treatments  $Gait Training 8-22 mins

## 2012-01-18 NOTE — Op Note (Signed)
NAMELAURELL, [name]               ACCOUNT NO.:  [id number]  MEDICAL RECORD NO.:  [id number]  LOCATION:  1620                         FACILITY:  [facility]  PHYSICIAN:  Madlyn Frankel. Charlann Boxer, M.D.  DATE OF BIRTH:  [birth year]  DATE OF PROCEDURE:  01/17/2012 DATE OF DISCHARGE:                              OPERATIVE REPORT   PREOPERATIVE DIAGNOSIS:  Right gluteal medius/minimus tear.  POSTOP DIAGNOSIS:  Right gluteal medius/minimus tear.  PROCEDURE:  Open repair of gluteus tendon tares utilizing #2 FiberWire sutures through drill holes with primary repair of overlying tensor fascia and gluteal fascia.  SURGEON:  Madlyn Frankel. Charlann Boxer, M.D.  ASSISTANT:  Lanney Gins, PA-C.  Note that [name] was present for the entirety of the case, helpful in critical part of the retraction of soft tissues as she was very large female positioned in the extremity as well as general facilitation of the case, and primary wound closure.  ANESTHESIA:  General.  SPECIMENS:  None.  COMPLICATION:  None.  BLOOD LOSS:  About 200 mL.  DRAINS:  None.  INDICATIONS FOR PROCEDURE:  [name] is a 56 year old female, who I have been following for chronic right lateral hip pain.  She had failed multiple injections, physical therapy, anti-inflammatories, ultimately MRI evaluation revealed concern for gluteus tendon tearing on the anterior aspect of the greater trochanter.  After reviewing with her the prospect of possible fixation, the risks of nonhealing, the risks of persistent discomfort related to gluteal weakness as well as the postoperative course and expectations given the duration and time to potentially to get heal bone of 6-8 weeks.  She wished at this point to proceed.  Consent was obtained for benefit of pain relief.  After reviewing risks and additional risk of infection and DVT consent was obtained.  PROCEDURE IN DETAIL:  Patient was brought to operative theater.  Once adequate anesthesia  preoperative antibiotics, Ancef administered, she was positioned to the left lateral decubitus position with the right side up.  The right lower extremity was then prepped and draped in sterile fashion.  Time-out was performed identifying the patient, planned procedure, and extremity.  A lateral incision was made over the trochanter, sharp dissection was carried down to the trochanter to identify the fascia of the tensor fascia lata as well as the gluteal fascia.  The iliotibial band and gluteal fascia were then incised longitudinally from distal to proximal direction to expose the underlying gluteus tissue.  With the hip externally rotated, this exposed the anterior aspect of the hip joint, and I was able identify the remaining gluteus medius muscle tendon attachment to the femur as well as the exposed area distal to this.  I then was able to identify disassociate the distinction between the gluteus medius, minimus, and the joint capsule.  At this point, I passed 3 #2 FiberWire sutures into the remaining gluteus minimus and medius tendon tissue.  It was noted this tissue was not extremely substantial most likely due to the chronicity of the issues as well as to the overlying adipose tissue.  Nonetheless, I was able to get purchase with 3 good bites into these layers of tissue.  I then made drill holes into  the proximal trochanter and reapproximated with a free needle, the gluteus medius and minimus tendons back to bone.  At this point, I had a palpable closed layer of muscle and tendon over top of this.  I then reapproximated the remaining fascia of the gluteus medius with #1 Vicryl over top of all this repair.  The iliotibial band and gluteal fascia overlying gluteus maximus fascia and tensor fascia were then reapproximated using #1 Vicryl.  The remaining wound with 2-0 Vicryl and running 4-0 Monocryl on the skin. The skin was cleaned and dried dressed sterilely using Dermabond  Aquacel dressing.  No drain was used.  Ms.  [name] will be admitted to the hospital for overnight observation and visit with physical therapy.  She will be touchdown weightbearing to try to limit the stress and no active abduction for 6-8 weeks to try to get this to heal as best as possible. Findings will be reviewed with the patient as well as family.     [name], M.D.     MDO/MEDQ  D:  01/17/2012  T:  01/18/2012  Job:  [id number]

## 2012-01-18 NOTE — Progress Notes (Signed)
   Subjective: 1 Day Post-Op Procedure(s) (LRB): OPEN SURGICAL REPAIR OF GLUTEAL TENDON (Right)   Patient reports pain as mild, pain well controlled. No events throughout the night.  Objective:   VITALS:   Filed Vitals:   01/18/12 0459  BP: 116/72  Pulse: 71  Temp: 97.6 F (36.4 C)  Resp: 16    Neurovascular intact Dorsiflexion/Plantar flexion intact Incision: dressing C/D/I No cellulitis present Compartment soft  LABS  Basename 01/18/12 0425  HGB 11.8*  HCT 36.6  WBC 16.8*  PLT 344     Basename 01/18/12 0425  NA 136  K 4.0  BUN 19  CREATININE 0.63  GLUCOSE 133*     Assessment/Plan: 1 Day Post-Op Procedure(s) (LRB): OPEN SURGICAL REPAIR OF GLUTEAL TENDON (Right) Foley cath d/c'ed Advance diet Up with therapy D/C IV fluids Plan for discharge tomorrow if continues to do well  Morbid Obesity (BMI >40)  Estimated Body mass index is 45.14 kg/(m^2) as calculated from the following:   Height as of this encounter: 5\' 4" (1.626 m).   Weight as of this encounter: 263 lb(119.296 kg). Patient also counseled that weight may inhibit the healing process Patient counseled that losing weight will help with future health issues     Anastasio Auerbach. Rino Hosea   PAC  01/18/2012, 7:45 AM

## 2012-01-18 NOTE — Progress Notes (Signed)
CARE MANAGEMENT NOTE 01/18/2012  Patient:  [name], [name]   Account Number:  [id number]  Date Initiated:  01/18/2012  Documentation initiated by:  Colleen Can  Subjective/Objective Assessment:   DX open repair of gluteus tendon     Action/Plan:   CM spoke with patient. Plans are for pt to return to her home in [facility] where her spouse and mother-in-law will be caregivers. Pt states she already has RW and commode seat.   Anticipated DC Date:  01/19/2012   Anticipated DC Plan:  HOME W HOME HEALTH SERVICES  In-house referral  NA      DC Planning Services  CM consult      Choice offered to / List presented to:     DME arranged  NA      DME agency  NA        Status of service:  In process, will continue to follow  If discussed at Long Length of Stay Meetings, dates discussed:    Comments:  01/18/2012 Damaris Schooner RN CCM Pt does not think she will need home PT. CM will folllow.

## 2012-01-18 NOTE — Evaluation (Signed)
Occupational Therapy Evaluation Patient Details Name: DRINA JOBST MRN: [id number] DOB: 16-Jan-1956 Today's Date: 01/18/2012 Time: 4098-1191 OT Time Calculation (min): 15 min  OT Assessment / Plan / Recommendation Clinical Impression  Pt s/p R glut tendon repair. Limited by pain and nausea at eval. Skilled OT recommended to maximize independence with BADLs to supervision level in prep for safe d/c home with prn A from family.    OT Assessment  Patient needs continued OT Services    Follow Up Recommendations  No OT follow up;Home health OT (based on progress)    Barriers to Discharge      Equipment Recommendations  None recommended by OT    Recommendations for Other Services    Frequency  Min 2X/week    Precautions / Restrictions Precautions Precautions: Fall Precaution Comments: NO  ABDUCTION RIGHT HIP Restrictions Weight Bearing Restrictions: Yes RLE Weight Bearing: Touchdown weight bearing   Pertinent Vitals/Pain Pt reported extensive pain in R hip which she did not rate. RN made aware, repositioned and cold applied.    ADL  Grooming: Simulated;Set up Where Assessed - Grooming: Unsupported sitting Upper Body Bathing: Simulated;Set up Where Assessed - Upper Body Bathing: Unsupported sitting Lower Body Bathing: Simulated;Moderate assistance Where Assessed - Lower Body Bathing: Supported sit to stand Upper Body Dressing: Simulated;Set up Where Assessed - Upper Body Dressing: Unsupported sitting Lower Body Dressing: Simulated;Moderate assistance Where Assessed - Lower Body Dressing: Supported sit to stand Toilet Transfer: Mining engineer Method: Sit to stand Toileting - Architect and Hygiene: Simulated;Minimal assistance Where Assessed - Engineer, mining and Hygiene: Standing Equipment Used: Rolling walker Transfers/Ambulation Related to ADLs: Pt ambulated around bed with minguard A. Pt does well maintaining  PWB status. ADL Comments: Eval limited 2* pt pain and nausea.    OT Diagnosis: Generalized weakness  OT Problem List: Decreased activity tolerance;Pain OT Treatment Interventions: Self-care/ADL training;Therapeutic activities;DME and/or AE instruction;Patient/family education   OT Goals Acute Rehab OT Goals OT Goal Formulation: With patient Time For Goal Achievement: 02/01/12 Potential to Achieve Goals: Good ADL Goals Pt Will Perform Grooming: with supervision;Standing at sink ADL Goal: Grooming - Progress: Goal set today Pt Will Perform Lower Body Bathing: with supervision;Sit to stand from chair;Sit to stand from bed ADL Goal: Lower Body Bathing - Progress: Goal set today Pt Will Perform Lower Body Dressing: with supervision;Sit to stand from bed;Sit to stand from chair ADL Goal: Lower Body Dressing - Progress: Goal set today Pt Will Transfer to Toilet: with supervision;Maintaining weight bearing status;Ambulation;with DME ADL Goal: Toilet Transfer - Progress: Goal set today Pt Will Perform Toileting - Hygiene: with supervision;Sit to stand from 3-in-1/toilet ADL Goal: Toileting - Hygiene - Progress: Goal set today Pt Will Perform Tub/Shower Transfer: Shower transfer;with supervision;Ambulation ADL Goal: Tub/Shower Transfer - Progress: Goal set today  Visit Information  Last OT Received On: 01/18/12 Assistance Needed: +1 PT/OT Co-Evaluation/Treatment: Yes    Subjective Data  Subjective: This is one sore butt... Patient Stated Goal: Not asked.   Prior Functioning     Home Living Lives With: Spouse;Family Available Help at Discharge: Family Type of Home: House Home Access: Stairs to enter Secretary/administrator of Steps: 5 Entrance Stairs-Rails: Right Home Layout: One level Bathroom Shower/Tub: Health visitor: Standard Home Adaptive Equipment: Bedside commode/3-in-1;Shower chair with back;Walker - rolling;Straight cane Additional Comments: amb with  cane prior to admission Prior Function Level of Independence: Independent with assistive device(s) Able to Take Stairs?: Yes Driving: Yes Communication Communication: No difficulties  Dominant Hand: Right         Vision/Perception     Cognition  Overall Cognitive Status: Appears within functional limits for tasks assessed/performed Arousal/Alertness: Awake/alert Orientation Level: Appears intact for tasks assessed Behavior During Session: [facility] for tasks performed    Extremity/Trunk Assessment Right Upper Extremity Assessment RUE ROM/Strength/Tone: [facility] for tasks assessed Left Upper Extremity Assessment LUE ROM/Strength/Tone: [facility] for tasks assessed Right Lower Extremity Assessment RLE ROM/Strength/Tone: Deficits;Unable to fully assess;Due to precautions;Due to pain RLE ROM/Strength/Tone Deficits: ankle WFL Left Lower Extremity Assessment LLE ROM/Strength/Tone: [facility] for tasks assessed     Mobility Bed Mobility Bed Mobility: Sit to Supine;Supine to Sit Supine to Sit: 4: Min assist Sit to Supine: 4: Min assist Details for Bed Mobility Assistance: cues for technique and precautions; pt with c/o of significant pain upon returning to supone; PT supporting LE throughout,and maintaining neutral hip, although pt still attempting to lift her own leg, cues to relax and decrease stress on RLE Transfers Sit to Stand: 4: Min assist;4: Min guard;From bed Stand to Sit: 4: Min assist;To bed Details for Transfer Assistance: cues for hand placement, RLE position and TDWB; assist with balance and RLE     Shoulder Instructions     Exercise     Balance     End of Session OT - End of Session Activity Tolerance: Patient limited by pain Patient left: in bed;with call bell/phone within reach;with family/visitor present  GO     Kielyn Kardell A OTR/L [phone] 01/18/2012, 2:23 PM

## 2012-01-18 NOTE — Evaluation (Signed)
Physical Therapy Evaluation Patient Details Name: [name] MRN: [id number] DOB: [birth year] Today's Date: 01/18/2012 Time: 7829-5621 PT Time Calculation (min): 31 min  PT Assessment / Plan / Recommendation Clinical Impression  pt s/p glut tendon repair and will benefit from PT to maximize independence and safety for home; Pt has 5 stps to enter house, will need to be safe with this prior to D/C . Pt OOB with nursing and reports no knowledge of precautions    PT Assessment  Patient needs continued PT services    Follow Up Recommendations  Home health PT    Does the patient have the potential to tolerate intense rehabilitation      Barriers to Discharge   5 steps    Equipment Recommendations  None recommended by PT    Recommendations for Other Services     Frequency Min 6X/week    Precautions / Restrictions Precautions Precautions: Fall Precaution Comments: NO  ABDUCTION RIGHT HIP Restrictions RLE Weight Bearing: Touchdown weight bearing   Pertinent Vitals/Pain Pain 8/10; pt too nauseated to take meds; RN aware      Mobility  Bed Mobility Bed Mobility: Supine to Sit Supine to Sit: 4: Min assist Details for Bed Mobility Assistance: cues for technique and precautions Transfers Transfers: Sit to Stand;Stand to Sit;Stand Pivot Transfers Sit to Stand: 4: Min assist;From bed;From chair/3-in-1 Stand to Sit: To bed;To chair/3-in-1;4: Min assist Stand Pivot Transfers: 4: Min assist Details for Transfer Assistance: cues for hand placement, RLE position and TDWB; assist with balance and RLE Ambulation/Gait Ambulation/Gait Assistance: 4: Min assist Ambulation Distance (Feet): 5 Feet (x 2) Assistive device: Rolling walker Ambulation/Gait Assistance Details: cues for TDWB and sequence Gait Pattern: Step-to pattern    Shoulder Instructions     Exercises     PT Diagnosis: Difficulty walking  PT Problem List: Decreased strength;Decreased range of motion;Decreased  activity tolerance;Decreased balance;Decreased mobility;Decreased knowledge of precautions;Decreased safety awareness;Decreased knowledge of use of DME PT Treatment Interventions: DME instruction;Gait training;Stair training;Functional mobility training;Therapeutic activities;Therapeutic exercise;Balance training;Patient/family education   PT Goals Acute Rehab PT Goals PT Goal Formulation: With patient Time For Goal Achievement: 01/25/12 Potential to Achieve Goals: Good Pt will go Supine/Side to Sit: with min assist PT Goal: Supine/Side to Sit - Progress: Goal set today Pt will go Sit to Supine/Side: with min assist PT Goal: Sit to Supine/Side - Progress: Goal set today (family able to assist) Pt will go Sit to Stand: with supervision PT Goal: Sit to Stand - Progress: Goal set today Pt will go Stand to Sit: with supervision PT Goal: Stand to Sit - Progress: Goal set today Pt will Ambulate: 16 - 50 feet;with supervision;with rolling walker PT Goal: Ambulate - Progress: Goal set today Pt will Go Up / Down Stairs: 3-5 stairs;with min assist;with least restrictive assistive device;with rail(s) PT Goal: Up/Down Stairs - Progress: Goal set today  Visit Information  Last PT Received On: 01/18/12 Assistance Needed: +1    Subjective Data  Subjective: I am hurting Patient Stated Goal: home   Prior Functioning  Home Living Lives With: Spouse;Other (Comment) (mother in law) Available Help at Discharge: Family Type of Home: House Home Access: Stairs to enter [employer] of Steps: 5 Entrance Stairs-Rails: Right Home Layout: One level Home Adaptive Equipment: Walker - rolling;Bedside commode/3-in-1;Straight cane Additional Comments: amb with cane prior to admission Prior Function Level of Independence: Independent with assistive device(s) Able to Take Stairs?: Yes Communication Communication: No difficulties    Cognition  Overall Cognitive Status:  Appears within functional  limits for tasks assessed/performed Arousal/Alertness: Awake/alert Orientation Level: Appears intact for tasks assessed Behavior During Session: [facility] for tasks performed    Extremity/Trunk Assessment Right Lower Extremity Assessment RLE ROM/Strength/Tone: Deficits;Unable to fully assess;Due to precautions;Due to pain RLE ROM/Strength/Tone Deficits: ankle WFL Left Lower Extremity Assessment LLE ROM/Strength/Tone: [facility] for tasks assessed   Balance    End of Session PT - End of Session Equipment Utilized During Treatment: Gait belt Activity Tolerance: Patient tolerated treatment well Patient left: in bed;with family/visitor present;with call bell/phone within reach  GP     [employer] 01/18/2012, 11:20 AM

## 2012-01-18 NOTE — Progress Notes (Signed)
Utilization review completed.  

## 2012-01-18 NOTE — Progress Notes (Signed)
01/18/12 Nursing 0230 Patient unable to void at this time. I/O cath for 575cc urine at ths time

## 2012-01-19 LAB — CBC
HCT: 33.9 % — ABNORMAL LOW (ref 36.0–46.0)
Hemoglobin: 10.9 g/dL — ABNORMAL LOW (ref 12.0–15.0)
MCH: 29.2 pg (ref 26.0–34.0)
MCHC: 32.2 g/dL (ref 30.0–36.0)
MCV: 90.9 fL (ref 78.0–100.0)
Platelets: 333 10*3/uL (ref 150–400)
RBC: 3.73 MIL/uL — ABNORMAL LOW (ref 3.87–5.11)
RDW: 14.3 % (ref 11.5–15.5)
WBC: 10.9 10*3/uL — ABNORMAL HIGH (ref 4.0–10.5)

## 2012-01-19 LAB — BASIC METABOLIC PANEL
BUN: 17 mg/dL (ref 6–23)
CO2: 31 mEq/L (ref 19–32)
Calcium: 8.8 mg/dL (ref 8.4–10.5)
Chloride: 100 mEq/L (ref 96–112)
Creatinine, Ser: 0.57 mg/dL (ref 0.50–1.10)
GFR calc Af Amer: >90 mL/min (ref >90)
GFR calc non Af Amer: >90 mL/min (ref >90)
Glucose, Bld: 121 mg/dL — ABNORMAL HIGH (ref 70–99)
Potassium: 3.6 mEq/L (ref 3.5–5.1)
Sodium: 137 mEq/L (ref 135–145)

## 2012-01-19 MED ORDER — DSS 100 MG PO CAPS: 100.0000 mg | ORAL_CAPSULE | Freq: Two times a day (BID) | ORAL | Status: DC

## 2012-01-19 MED ORDER — DIPHENHYDRAMINE HCL 25 MG PO CAPS: 25.0000 mg | ORAL_CAPSULE | Freq: Four times a day (QID) | ORAL | Status: DC | PRN

## 2012-01-19 MED ORDER — FERROUS SULFATE 325 (65 FE) MG PO TABS: 325.0000 mg | ORAL_TABLET | Freq: Three times a day (TID) | ORAL | Status: DC

## 2012-01-19 MED ORDER — POLYETHYLENE GLYCOL 3350 17 G PO PACK: 17.0000 g | PACK | Freq: Two times a day (BID) | ORAL | Status: DC

## 2012-01-19 MED ORDER — ASPIRIN EC 325 MG PO TBEC: 325.0000 mg | DELAYED_RELEASE_TABLET | Freq: Two times a day (BID) | ORAL | Status: DC

## 2012-01-19 MED ORDER — OXYCODONE HCL 5 MG PO TABS: 5.0000 mg | ORAL_TABLET | ORAL | Status: DC | PRN

## 2012-01-19 MED ORDER — METHOCARBAMOL 500 MG PO TABS: 500.0000 mg | ORAL_TABLET | Freq: Four times a day (QID) | ORAL | Status: DC | PRN

## 2012-01-19 NOTE — Progress Notes (Signed)
Physical Therapy Treatment Patient Details Name: [name] MRN: [id number] DOB: [birth year] Today's Date: 01/19/2012 Time: 4098-1191 PT Time Calculation (min): 24 min  PT Assessment / Plan / Recommendation Comments on Treatment Session  Attempted 5 steps with 2 family members.  Pt unable do to just one as she demon increased anxiety/pain and fear of putting too much weight thru her R LE. Pt given handout on HEP. Instructed on NO active ABD R LE and strict TTWB.    Follow Up Recommendations  Home health PT     Does the patient have the potential to tolerate intense rehabilitation     Barriers to Discharge        Equipment Recommendations  Other (comment) (Pt has all equipment from prior)    Recommendations for Other Services    Frequency Min 6X/week   Plan Discharge plan remains appropriate    Precautions / Restrictions Precautions Precautions: Fall Precaution Comments: NO ACTIVE R LE ABDUCTION Restrictions Weight Bearing Restrictions: Yes RLE Weight Bearing: Touchdown weight bearing Other Position/Activity Restrictions: Toe Touch Weight Bearing    Pertinent Vitals/Pain C/o 8/10 R hip pain ICE applied    Mobility  Bed Mobility Bed Mobility: Not assessed Details for Bed Mobility Assistance: Pt OOB in recliner Transfers Transfers: Sit to Stand;Stand to Sit Sit to Stand: 4: Min guard;4: Min assist;From chair/3-in-1 Stand to Sit: 4: Min assist;4: Min guard;To chair/3-in-1 Details for Transfer Assistance: 50% VC's on proper tech and hand placement.  Increased time needed due to anxiety/pain level. Ambulation/Gait Ambulation/Gait Assistance: 4: Min guard;4: Min Environmental consultant (Feet): 4 Feet Assistive device: Rolling walker Ambulation/Gait Assistance Details: 25% VC's on sequencing and increased time due to pain level and some anxiety. Gait Pattern: Step-to pattern;Decreased stance time - right Gait velocity: decreased Stairs: Yes Stairs Assistance:  2: Max Editor, commissioning Details (indicate cue type and reason): Attempted 5 steps using one rail an L and one crutch on R with 2 family members however pt unable to even perform one step as she demon max anxiety/fear of pain and limited WBing. Stair Management Technique: One rail Left;With crutches Number of Stairs: 0     PT Goals                               progressing   Visit Information  Last PT Received On: 01/19/12 Assistance Needed: +1    Subjective Data  Subjective: I did not sleep well last night Patient Stated Goal: home   Cognition  Overall Cognitive Status: Appears within functional limits for tasks assessed/performed Arousal/Alertness: Awake/alert Orientation Level: Appears intact for tasks assessed Behavior During Session: [facility] for tasks performed    Balance     End of Session PT - End of Session Equipment Utilized During Treatment: Gait belt Activity Tolerance: Patient limited by fatigue;Patient limited by pain Patient left: in chair;with call bell/phone within reach;with family/visitor present (ICE to R thigh)   [name]  PTA [facility]  Acute  Rehab Pager     [phone]

## 2012-01-19 NOTE — Progress Notes (Addendum)
Physical Therapy Treatment Patient Details Name: [name] MRN: [id number] DOB: 03-Dec-1955 Today's Date: 01/19/2012 Time: 8295-6213 PT Time Calculation (min): 42 min  PT Assessment / Plan / Recommendation Comments on Treatment Session  Pt progressing slowly and limited by pain and Toe Touch Weight Bearing  Assisted py OOB to [facility] then to recliner to perform TE's.  Pt required increased time and freq rest breaks due to pain and anxiety.   Follow Up Recommendations  Home health PT     Does the patient have the potential to tolerate intense rehabilitation     Barriers to Discharge        Equipment Recommendations  Other (comment) (has all equipment)    Recommendations for Other Services    Frequency Min 6X/week   Plan Discharge plan remains appropriate    Precautions / Restrictions Precautions Precautions: Fall Precaution Comments: NO ACTIVE R LE ABDUCTION Restrictions Weight Bearing Restrictions: Yes RLE Weight Bearing: Touchdown weight bearing Other Position/Activity Restrictions: Toe Touch Weight Bearing    Pertinent Vitals/Pain C/o 8/10 R hip pain ICE applied    Mobility  Transfers Sit to Stand: 4: Min assist;With upper extremity assist;From chair/3-in-1 Stand to Sit: 4: Min assist;With upper extremity assist;With armrests;To chair/3-in-1 Details for Transfer Assistance: cues for hand placement, RLE position and TDWB; assist with balance and RLE  Amb limited distance 2' at [facility] assist w/ RW at TTWB R LE with 25% VC's on safety with turns and backward gait.    Exercises Total Joint Exercises Ankle Circles/Pumps: Both;10 reps;Supine;AROM Quad Sets: AROM;Both;10 reps;Supine Gluteal Sets: AROM;Both;10 reps;Supine Short Arc Quad: AROM;Right;10 reps;Supine Heel Slides: AAROM;Right;10 reps;Supine   PT Goals                                                                 progressing    Visit Information  Last PT Received On: 01/19/12 Assistance Needed: +1      Subjective Data  Subjective: I did not sleep well last night Patient Stated Goal: home   Cognition  Overall Cognitive Status: Appears within functional limits for tasks assessed/performed Arousal/Alertness: Awake/alert Orientation Level: Appears intact for tasks assessed Behavior During Session: [facility] for tasks performed    Balance     End of Session PT - End of Session Equipment Utilized During Treatment: Gait belt Activity Tolerance: Patient limited by pain Patient left: in chair;with call bell/phone within reach (ICE to R hip area)   [name]  PTA [facility]  Acute  Rehab Pager     [phone]

## 2012-01-19 NOTE — Progress Notes (Signed)
CARE MANAGEMENT NOTE 01/19/2012  Patient:  [name], [name]   Account Number:  [id number]  Date Initiated:  01/18/2012  Documentation initiated by:  Colleen Can  Subjective/Objective Assessment:   DX open repair of gluteus tendon     Action/Plan:   CM spoke with patient. Plans are for pt to return to her home in [place] where her spouse and mother-in-law will be caregivers. Pt states she already has RW and commode seat.   Anticipated DC Date:  01/19/2012   Anticipated DC Plan:  HOME W HOME HEALTH SERVICES  In-house referral  NA      DC Planning Services  CM consult      Choice offered to / List presented to:     DME arranged  NA      DME agency  NA        Status of service:  Completed, signed off Medicare Important Message given?   (If response is "NO", the following Medicare IM given date fields will be blank) Date Medicare IM given:   Date Additional Medicare IM given:    Discharge Disposition:  HOME/SELF CARE     Comments:  01/19/2012 Colleen Can BSN RN CCM [phone] Orders for HHpt. Unsuccessful at setting up HHpt d/t no available resources in [place].[facility], [facility] and [facility] have no available resources in area. [facility] are not in network with insurance. Matt Babish notified of lack of HHpt availablity by care co-ordinator Physical therapy has given patient PT homework. Pt advised of the abve and states that her spouse and mother-in-law will be able to assist her with exercises. States she has follow up appt with MD in 2 weeks and plans to start outpt physical therapy when it is recommended by her MD.

## 2012-01-19 NOTE — Progress Notes (Signed)
Pt assessment has not changed from am. Pt verbalized understanding of discharge instructions. Pt was given d/c forms and prescription. [facility] was called for patient transport to home related to patient could not go up stairs with physical therapy.

## 2012-01-19 NOTE — Progress Notes (Signed)
Occupational Therapy Treatment Patient Details Name: [name] MRN: [id number] DOB: Feb 08, 1956 Today's Date: 01/19/2012 Time: 1032-1100 OT Time Calculation (min): 28 min  OT Assessment / Plan / Recommendation Comments on Treatment Session Pt doing much better today but still having a significant amount of pain.    Follow Up Recommendations  No OT follow up    Barriers to Discharge       Equipment Recommendations  None recommended by OT    Recommendations for Other Services    Frequency Min 2X/week   Plan Discharge plan remains appropriate    Precautions / Restrictions Precautions Precautions: Fall Restrictions Weight Bearing Restrictions: Yes RLE Weight Bearing: Touchdown weight bearing   Pertinent Vitals/Pain Reported 7/10 pain in R hip/buttocks. Repositioned for comfort.    ADL  Upper Body Dressing: Performed;Set up Where Assessed - Upper Body Dressing: Unsupported sitting Lower Body Dressing: Performed;Minimal assistance Where Assessed - Lower Body Dressing: Supported sit to stand Toilet Transfer: Performed;Minimal assistance Toilet Transfer Method: Surveyor, minerals: Materials engineer and Hygiene: Performed;Min guard Where Assessed - Engineer, mining and Hygiene: Sit to stand from 3-in-1 or toilet ADL Comments: Educated pt how to use AE for LB ADLs with good return demo. Pt's husband will also be available to assist. Verbally instructed and demo'd how to safely step into and out of shower. Pt declined ambulation to the bathroom.    OT Diagnosis:    OT Problem List:   OT Treatment Interventions:     OT Goals ADL Goals ADL Goal: Lower Body Dressing - Progress: Progressing toward goals ADL Goal: Toilet Transfer - Progress: Progressing toward goals ADL Goal: Toileting - Hygiene - Progress: Progressing toward goals  Visit Information  Last OT Received On: 01/19/12 Assistance Needed: +1      Subjective Data  Subjective: I've always had a hard time putting on my socks.   Prior Functioning       Cognition  Overall Cognitive Status: Appears within functional limits for tasks assessed/performed Arousal/Alertness: Awake/alert Orientation Level: Appears intact for tasks assessed Behavior During Session: [facility] for tasks performed    Mobility  Shoulder Instructions Transfers Sit to Stand: 4: Min assist;With upper extremity assist;From chair/3-in-1 Stand to Sit: 4: Min assist;With upper extremity assist;With armrests;To chair/3-in-1 Details for Transfer Assistance: cues for hand placement, RLE position and TDWB; assist with balance and RLE        Exercises      Balance     End of Session OT - End of Session Activity Tolerance: Patient limited by pain Patient left: in chair;with call bell/phone within reach  GO     Ayodele Hartsock A 01/19/2012, 11:06 AM

## 2012-01-19 NOTE — Progress Notes (Signed)
   Subjective: 2 Days Post-Op Procedure(s) (LRB): OPEN SURGICAL REPAIR OF GLUTEAL TENDON (Right)   Patient reports pain as mild, pain well controlled. No events throughout the night. Ready to be discharged home.  Objective:   VITALS:   Filed Vitals:   01/19/12 0452  BP: 139/80  Pulse: 78  Temp: 98.1 F (36.7 C)  Resp: 16    Neurovascular intact Dorsiflexion/Plantar flexion intact Incision: dressing C/D/I No cellulitis present Compartment soft  LABS  Basename 01/19/12 0430 01/18/12 0425  HGB 10.9* 11.8*  HCT 33.9* 36.6  WBC 10.9* 16.8*  PLT 333 344     Basename 01/19/12 0430 01/18/12 0425  NA 137 136  K 3.6 4.0  BUN 17 19  CREATININE 0.57 0.63  GLUCOSE 121* 133*     Assessment/Plan: 2 Days Post-Op Procedure(s) (LRB): OPEN SURGICAL REPAIR OF GLUTEAL TENDON (Right) Up with therapy Discharge home with home health Follow up in 2 weeks at [facility]. Follow up with OLIN,Iyona Pehrson D in 2 weeks.  Contact information:  [facility] [address] [place] [address] [phone]    Morbid Obesity (BMI >40)  Estimated Body mass index is 45.14 kg/(m^2) as calculated from the following:  Height as of this encounter: 5\' 4" (1.626 m).  Weight as of this encounter: 263 lb(119.296 kg).  Patient also counseled that weight may inhibit the healing process  Patient counseled that losing weight will help with future health issues     Anastasio Auerbach. Cahlil Sattar   PAC  01/19/2012, 9:33 AM

## 2012-01-24 ENCOUNTER — Telehealth: Payer: Self-pay | Admitting: Family Medicine

## 2012-01-24 NOTE — Telephone Encounter (Signed)
I spoke with pt, D/C from hospital Wed, 11/27, symptoms started on Thurs., 11/28.  Pt did have cath in hospital.  Sx include frequency, burning, foul odor.  Denies fever, taking Azor, not helping with discomfort.

## 2012-01-24 NOTE — Telephone Encounter (Signed)
Pt was taken home by ambucab, husband working 12 hour shifts and cannot get to office from [place] during the day.  No neighbor is going to drive that far to pick up a container and then return with urine.  Pt is not to bear any weight on her leg at all, has surgeon appt next Monday.

## 2012-01-24 NOTE — Telephone Encounter (Signed)
This patient really needs to have urine culture, especially with recent hospitalization and increased risk of resistant pathogen.   She should be seen tomorrow at latest and sooner (ER) if fever.

## 2012-01-24 NOTE — Telephone Encounter (Signed)
Pt called and said that she just had hip surgery. Pt has a uti and is req an abx to be called in to [facility]

## 2012-01-24 NOTE — Telephone Encounter (Signed)
Call in Cipro 500 mg po bid for 5 days.  This is less then ideal with recent hospitalization and foley though I understand difficulties of situation. She has higher risk of resistant pathogen.

## 2012-01-25 MED ORDER — CIPROFLOXACIN HCL 500 MG PO TABS: 500.0000 mg | ORAL_TABLET | Freq: Two times a day (BID) | ORAL | Status: DC

## 2012-01-25 NOTE — Telephone Encounter (Signed)
Pt informed on personally identified VM, Rx will be sent to [facility]

## 2012-01-26 NOTE — Discharge Summary (Signed)
Physician Discharge Summary  Patient ID: [name] MRN: [id number] DOB/AGE: 1956/01/16 56 y.o.  Admit date: 01/17/2012 Discharge date: 01/19/2012   Procedures:  Procedure(s) (LRB): OPEN SURGICAL REPAIR OF GLUTEAL TENDON (Right)  Attending Physician:  Dr. Durene Romans   Admission Diagnoses:   Right hip pain / gluteal tendon tear  Discharge Diagnoses:  Principal Problem:  *Right gluteus tear Active Problems:  Morbid obesity with BMI of 45.0-49.9, adult HYPERLIPIDEMIA   DEPRESSION   HYPERTENSION   GERD   SEBORRHEIC KERATOSIS   Arthritis - RA   Bronchitis - SEASONAL    Fibromyalgia   HPI: Pt is a 56 y.o. female complaining of right hip pain for 6+ months. Pain had continually increased since the beginning. She denies any trauma or initiating event. Pain started spontaneously and has been increasing since the beginning. X-rays in the clinic show some arthritic changes of the right hip, but no acute bony abnormalities. An MRI reveled marked partial tearing of the distal right gluteus minimus tendon with mild underlying bursitis and moderate muscle atrophy. Pt has tried various conservative treatments which have failed to alleviate their symptoms, including steroid injections and NSAIDs. Various options are discussed with the patient. Risks, benefits and expectations were discussed with the patient. Patient understand the risks, benefits and expectations and wishes to proceed with surgery.  PCP: Kristian Covey, MD   Discharged Condition: good  Hospital Course:  Patient underwent the above stated procedure on 01/17/2012. Patient tolerated the procedure well and brought to the recovery room in good condition and subsequently to the floor.  POD #1 BP: 116/72 ; Pulse: 71 ; Temp: 97.6 F (36.4 C) ; Resp: 16  Pt's foley was removed, as well as the hemovac drain removed. IV was changed to a saline lock. Patient reports pain as mild, pain well controlled. No events throughout  the night. Neurovascular intact, dorsiflexion/plantar flexion intact, incision: dressing C/D/I, no cellulitis present and compartment soft.   LABS  Basename  01/18/12 0425   HGB  11.8  HCT  36.6   POD #2  BP: 139/80 ; Pulse: 78 ; Temp: 98.1 F (36.7 C) ; Resp: 16  Patient reports pain as mild, pain well controlled. No events throughout the night. Ready to be discharged home. Neurovascular intact, dorsiflexion/plantar flexion intact, incision: dressing C/D/I, no cellulitis present and compartment soft.   LABS  Basename  01/19/12 0430   HGB  10.9  HCT  33.9    Discharge Exam: General appearance: alert, cooperative and no distress Extremities: Homans sign is negative, no sign of DVT, no edema, redness or tenderness in the calves or thighs and no ulcers, gangrene or trophic changes  Disposition:  Home-Health Care Svc with follow up in 2 weeks   Follow-up Information    Follow up with Shelda Pal, MD. Schedule an appointment as soon as possible for a visit in 2 weeks.   Contact information:   [address] [place] [address] [phone]          Discharge Orders    Future Orders Please Complete By Expires   Diet - low sodium heart healthy      Call MD / Call 911      Comments:   If you experience chest pain or shortness of breath, CALL 911 and be transported to the hospital emergency room.  If you develope a fever above 101 F, pus (white drainage) or increased drainage or redness at the wound, or calf pain,  call your surgeon's office.   Discharge instructions      Comments:   Maintain surgical dressing for 10-14 days, then replace with gauze and tape. Keep the area dry and clean until follow up. Follow up in 2 weeks at [facility]. Call with any questions or concerns.   Constipation Prevention      Comments:   Drink plenty of fluids.  Prune juice may be helpful.  You may use a stool softener, such as Colace (over the counter) 100 mg twice a  day.  Use MiraLax (over the counter) for constipation as needed.   Touch down weight bearing         Discharge Medication List as of 01/19/2012 11:35 AM    START taking these medications   Details  diphenhydrAMINE (BENADRYL) 25 mg capsule Take 1 capsule (25 mg total) by mouth every 6 (six) hours as needed for itching, allergies or sleep., Starting 01/19/2012, Until Discontinued, No Print    docusate sodium 100 MG CAPS Take 100 mg by mouth 2 (two) times daily., Starting 01/19/2012, Until Discontinued, No Print    ferrous sulfate 325 (65 FE) MG tablet Take 1 tablet (325 mg total) by mouth 3 (three) times daily after meals., Starting 01/19/2012, Until Discontinued, No Print    methocarbamol (ROBAXIN) 500 MG tablet Take 1 tablet (500 mg total) by mouth every 6 (six) hours as needed (muscle spasms)., Starting 01/19/2012, Until Discontinued, No Print    oxyCODONE (OXY IR/ROXICODONE) 5 MG immediate release tablet Take 1-2 tablets (5-10 mg total) by mouth every 4 (four) hours as needed for pain., Starting 01/19/2012, Until Discontinued, Print    polyethylene glycol (MIRALAX / GLYCOLAX) packet Take 17 g by mouth 2 (two) times daily., Starting 01/19/2012, Until Discontinued, No Print      CONTINUE these medications which have CHANGED   Details  aspirin EC 325 MG tablet Take 1 tablet (325 mg total) by mouth 2 (two) times daily. X 4 weeks, Starting 01/19/2012, Until Discontinued, No Print      CONTINUE these medications which have NOT CHANGED   Details  acetaminophen (TYLENOL ARTHRITIS PAIN) 650 MG CR tablet Take 1,300 mg by mouth every 8 (eight) hours as needed. pain, Until Discontinued, Historical Med    estradiol (ESTRACE) 1 MG tablet TAKE 1 TABLET (1 MG TOTAL) DAILY, Normal    fluticasone (FLONASE) 50 MCG/ACT nasal spray Place 2 sprays into the nose daily., Until Discontinued, Historical Med    folic acid (FOLVITE) 1 MG tablet Take 2 mg by mouth daily. , Until Discontinued, Historical Med      hydrochlorothiazide (HYDRODIURIL) 25 MG tablet Take 25 mg by mouth daily., Until Discontinued, Historical Med    lisinopril (PRINIVIL,ZESTRIL) 20 MG tablet Take 20 mg by mouth daily., Until Discontinued, Historical Med    omeprazole (PRILOSEC) 20 MG capsule Take 20 mg by mouth 2 (two) times daily., Until Discontinued, Historical Med    predniSONE (DELTASONE) 5 MG tablet Take 5 mg by mouth daily. , Until Discontinued, Historical Med    sertraline (ZOLOFT) 50 MG tablet Take 50 mg by mouth daily., Until Discontinued, Historical Med    simvastatin (ZOCOR) 20 MG tablet Take 20 mg by mouth at bedtime., Until Discontinued, Historical Med    zolpidem (AMBIEN) 10 MG tablet Take 10 mg by mouth at bedtime as needed. Insomnia, Until Discontinued, Historical Med      STOP taking these medications     etanercept (ENBREL) 50 MG/ML injection Comments:  Reason for  Stopping:       methotrexate 25 MG/ML injection Comments:  Reason for Stopping:       oxyCODONE-acetaminophen (PERCOCET) 5-325 MG per tablet Comments:  Reason for Stopping:           Signed: Anastasio Auerbach. Jeannene Tschetter   PAC  01/26/2012, 7:58 PM

## 2012-01-27 DIAGNOSIS — Z4789 Encounter for other orthopedic aftercare: Secondary | ICD-10-CM | POA: Diagnosis not present

## 2012-01-27 DIAGNOSIS — M069 Rheumatoid arthritis, unspecified: Secondary | ICD-10-CM | POA: Diagnosis not present

## 2012-01-27 DIAGNOSIS — I10 Essential (primary) hypertension: Secondary | ICD-10-CM | POA: Diagnosis not present

## 2012-01-27 DIAGNOSIS — M6281 Muscle weakness (generalized): Secondary | ICD-10-CM | POA: Diagnosis not present

## 2012-01-27 DIAGNOSIS — IMO0001 Reserved for inherently not codable concepts without codable children: Secondary | ICD-10-CM | POA: Diagnosis not present

## 2012-01-27 DIAGNOSIS — R269 Unspecified abnormalities of gait and mobility: Secondary | ICD-10-CM | POA: Diagnosis not present

## 2012-01-28 DIAGNOSIS — M069 Rheumatoid arthritis, unspecified: Secondary | ICD-10-CM | POA: Diagnosis not present

## 2012-01-28 DIAGNOSIS — R269 Unspecified abnormalities of gait and mobility: Secondary | ICD-10-CM | POA: Diagnosis not present

## 2012-01-28 DIAGNOSIS — IMO0001 Reserved for inherently not codable concepts without codable children: Secondary | ICD-10-CM | POA: Diagnosis not present

## 2012-01-28 DIAGNOSIS — M6281 Muscle weakness (generalized): Secondary | ICD-10-CM | POA: Diagnosis not present

## 2012-01-28 DIAGNOSIS — Z4789 Encounter for other orthopedic aftercare: Secondary | ICD-10-CM | POA: Diagnosis not present

## 2012-02-03 DIAGNOSIS — Z4789 Encounter for other orthopedic aftercare: Secondary | ICD-10-CM | POA: Diagnosis not present

## 2012-02-03 DIAGNOSIS — R269 Unspecified abnormalities of gait and mobility: Secondary | ICD-10-CM | POA: Diagnosis not present

## 2012-02-03 DIAGNOSIS — IMO0001 Reserved for inherently not codable concepts without codable children: Secondary | ICD-10-CM | POA: Diagnosis not present

## 2012-02-03 DIAGNOSIS — M6281 Muscle weakness (generalized): Secondary | ICD-10-CM | POA: Diagnosis not present

## 2012-02-03 DIAGNOSIS — M069 Rheumatoid arthritis, unspecified: Secondary | ICD-10-CM | POA: Diagnosis not present

## 2012-02-04 ENCOUNTER — Other Ambulatory Visit: Payer: Self-pay | Admitting: Family Medicine

## 2012-02-04 DIAGNOSIS — M069 Rheumatoid arthritis, unspecified: Secondary | ICD-10-CM | POA: Diagnosis not present

## 2012-02-04 DIAGNOSIS — R269 Unspecified abnormalities of gait and mobility: Secondary | ICD-10-CM | POA: Diagnosis not present

## 2012-02-04 DIAGNOSIS — Z4789 Encounter for other orthopedic aftercare: Secondary | ICD-10-CM | POA: Diagnosis not present

## 2012-02-04 DIAGNOSIS — M6281 Muscle weakness (generalized): Secondary | ICD-10-CM | POA: Diagnosis not present

## 2012-02-04 DIAGNOSIS — IMO0001 Reserved for inherently not codable concepts without codable children: Secondary | ICD-10-CM | POA: Diagnosis not present

## 2012-02-07 DIAGNOSIS — M6281 Muscle weakness (generalized): Secondary | ICD-10-CM | POA: Diagnosis not present

## 2012-02-07 DIAGNOSIS — IMO0001 Reserved for inherently not codable concepts without codable children: Secondary | ICD-10-CM | POA: Diagnosis not present

## 2012-02-07 DIAGNOSIS — R269 Unspecified abnormalities of gait and mobility: Secondary | ICD-10-CM | POA: Diagnosis not present

## 2012-02-07 DIAGNOSIS — Z4789 Encounter for other orthopedic aftercare: Secondary | ICD-10-CM | POA: Diagnosis not present

## 2012-02-07 DIAGNOSIS — M069 Rheumatoid arthritis, unspecified: Secondary | ICD-10-CM | POA: Diagnosis not present

## 2012-02-08 DIAGNOSIS — IMO0001 Reserved for inherently not codable concepts without codable children: Secondary | ICD-10-CM | POA: Diagnosis not present

## 2012-02-08 DIAGNOSIS — M069 Rheumatoid arthritis, unspecified: Secondary | ICD-10-CM | POA: Diagnosis not present

## 2012-02-08 DIAGNOSIS — M6281 Muscle weakness (generalized): Secondary | ICD-10-CM | POA: Diagnosis not present

## 2012-02-08 DIAGNOSIS — R269 Unspecified abnormalities of gait and mobility: Secondary | ICD-10-CM | POA: Diagnosis not present

## 2012-02-08 DIAGNOSIS — Z4789 Encounter for other orthopedic aftercare: Secondary | ICD-10-CM | POA: Diagnosis not present

## 2012-02-11 DIAGNOSIS — M6281 Muscle weakness (generalized): Secondary | ICD-10-CM | POA: Diagnosis not present

## 2012-02-11 DIAGNOSIS — IMO0001 Reserved for inherently not codable concepts without codable children: Secondary | ICD-10-CM | POA: Diagnosis not present

## 2012-02-11 DIAGNOSIS — M069 Rheumatoid arthritis, unspecified: Secondary | ICD-10-CM | POA: Diagnosis not present

## 2012-02-11 DIAGNOSIS — Z4789 Encounter for other orthopedic aftercare: Secondary | ICD-10-CM | POA: Diagnosis not present

## 2012-02-11 DIAGNOSIS — R269 Unspecified abnormalities of gait and mobility: Secondary | ICD-10-CM | POA: Diagnosis not present

## 2012-02-14 DIAGNOSIS — R269 Unspecified abnormalities of gait and mobility: Secondary | ICD-10-CM | POA: Diagnosis not present

## 2012-02-14 DIAGNOSIS — M6281 Muscle weakness (generalized): Secondary | ICD-10-CM | POA: Diagnosis not present

## 2012-02-14 DIAGNOSIS — IMO0001 Reserved for inherently not codable concepts without codable children: Secondary | ICD-10-CM | POA: Diagnosis not present

## 2012-02-14 DIAGNOSIS — M069 Rheumatoid arthritis, unspecified: Secondary | ICD-10-CM | POA: Diagnosis not present

## 2012-02-14 DIAGNOSIS — Z4789 Encounter for other orthopedic aftercare: Secondary | ICD-10-CM | POA: Diagnosis not present

## 2012-02-15 DIAGNOSIS — IMO0001 Reserved for inherently not codable concepts without codable children: Secondary | ICD-10-CM | POA: Diagnosis not present

## 2012-02-15 DIAGNOSIS — Z4789 Encounter for other orthopedic aftercare: Secondary | ICD-10-CM | POA: Diagnosis not present

## 2012-02-15 DIAGNOSIS — M069 Rheumatoid arthritis, unspecified: Secondary | ICD-10-CM | POA: Diagnosis not present

## 2012-02-15 DIAGNOSIS — R269 Unspecified abnormalities of gait and mobility: Secondary | ICD-10-CM | POA: Diagnosis not present

## 2012-02-15 DIAGNOSIS — M6281 Muscle weakness (generalized): Secondary | ICD-10-CM | POA: Diagnosis not present

## 2012-02-18 DIAGNOSIS — M6281 Muscle weakness (generalized): Secondary | ICD-10-CM | POA: Diagnosis not present

## 2012-02-18 DIAGNOSIS — IMO0001 Reserved for inherently not codable concepts without codable children: Secondary | ICD-10-CM | POA: Diagnosis not present

## 2012-02-18 DIAGNOSIS — M069 Rheumatoid arthritis, unspecified: Secondary | ICD-10-CM | POA: Diagnosis not present

## 2012-02-18 DIAGNOSIS — Z4789 Encounter for other orthopedic aftercare: Secondary | ICD-10-CM | POA: Diagnosis not present

## 2012-02-18 DIAGNOSIS — R269 Unspecified abnormalities of gait and mobility: Secondary | ICD-10-CM | POA: Diagnosis not present

## 2012-02-25 ENCOUNTER — Other Ambulatory Visit: Payer: Self-pay | Admitting: Family Medicine

## 2012-02-25 MED ORDER — ZOLPIDEM TARTRATE 10 MG PO TABS: 10.0000 mg | ORAL_TABLET | Freq: Every evening | ORAL | Status: DC | PRN

## 2012-02-25 NOTE — Telephone Encounter (Signed)
Pt needs new rx sent to [facility] zolpidem 10 mg #90 with refills. Pt stated [facility] never received rx

## 2012-02-25 NOTE — Telephone Encounter (Signed)
I called [facility], confirmed with pharmacist that the 02/04/12 Rx for Zolpidem 10 mg, #90 with 0 refills was not received, so I called it in today.

## 2012-04-18 DIAGNOSIS — M76899 Other specified enthesopathies of unspecified lower limb, excluding foot: Secondary | ICD-10-CM | POA: Diagnosis not present

## 2012-04-18 DIAGNOSIS — M159 Polyosteoarthritis, unspecified: Secondary | ICD-10-CM | POA: Diagnosis not present

## 2012-04-18 DIAGNOSIS — IMO0001 Reserved for inherently not codable concepts without codable children: Secondary | ICD-10-CM | POA: Diagnosis not present

## 2012-04-18 DIAGNOSIS — M069 Rheumatoid arthritis, unspecified: Secondary | ICD-10-CM | POA: Diagnosis not present

## 2012-04-20 DIAGNOSIS — Z4789 Encounter for other orthopedic aftercare: Secondary | ICD-10-CM | POA: Diagnosis not present

## 2012-05-11 ENCOUNTER — Ambulatory Visit (INDEPENDENT_AMBULATORY_CARE_PROVIDER_SITE_OTHER): Payer: BC Managed Care – PPO | Admitting: Family Medicine

## 2012-05-11 ENCOUNTER — Encounter: Payer: Self-pay | Admitting: Family Medicine

## 2012-05-11 VITALS — BP 130/68 | HR 78 | Temp 98.2°F | Wt 276.0 lb

## 2012-05-11 DIAGNOSIS — R5383 Other fatigue: Secondary | ICD-10-CM

## 2012-05-11 DIAGNOSIS — R5381 Other malaise: Secondary | ICD-10-CM | POA: Diagnosis not present

## 2012-05-11 DIAGNOSIS — I10 Essential (primary) hypertension: Secondary | ICD-10-CM

## 2012-05-11 DIAGNOSIS — G4730 Sleep apnea, unspecified: Secondary | ICD-10-CM | POA: Diagnosis not present

## 2012-05-11 LAB — TSH: TSH: 1.05 u[IU]/mL (ref 0.35–5.50)

## 2012-05-11 MED ORDER — LISINOPRIL 20 MG PO TABS: 20.0000 mg | ORAL_TABLET | Freq: Every day | ORAL | Status: DC

## 2012-05-11 NOTE — Progress Notes (Signed)
Subjective:    Patient ID: [name], female    DOB: 1955-04-02, 57 y.o.   MRN: [id number]  HPI Patient seen with concern for predominantly increasing fatigue She has chronic problems including history of hypertension, hyperlipidemia, rheumatoid arthritis. She is followed closely by rheumatology had recent CBC and comprehensive metabolic panel has not been called yet regarding those results.  She's had progressive fatigue over several months. She's had some sleep disturbance. She frequently snores and husband has noted episodes where she quits breathing sometimes for several seconds. Generally does not feel rested at all when she gets up in the morning. She's had some progressive weight gain in recent months. No history of depressed mood currently. She has been treated for depression in the past.  She also reports some cold intolerance and constipation in addition to her weight gain. She's not any recent thyroid functions. No reported alopecia.  Past Medical History  Diagnosis Date  . ANGIOMA 10/03/2008    REMOVED FROM RIGHT LOWER LEG-BENIGN  . HYPERLIPIDEMIA 08/27/2008  . DEPRESSION 12/10/2009  . HYPERTENSION 08/27/2008  . GERD 08/27/2008  . SEBORRHEIC KERATOSIS 08/27/2008  . Arthritis     RA  . Bronchitis     SEASONAL  . Hyperlipemia   . Fibromyalgia    Past Surgical History  Procedure Laterality Date  . Cesarean section  1980  . Cesarean section  1982  . Cholecystectomy  1997  . Abdominal hysterectomy  1995  . Foot surgery  2004, 2005, 2008  . Knee arthroscopy  2007, 2208, 2009  . Nasal sinus surgery  2013  . Joint replacement  10,12    lt total knee  . Breast ductal system excision  08/16/2011    Procedure: EXCISION DUCTAL SYSTEM BREAST;  Surgeon: Clovis Pu. Cornett, MD;  Location: [facility];  Service: General;  Laterality: Left;  left breast duct excision  . Open surgical repair of gluteal tendon  01/17/2012    Procedure: OPEN SURGICAL REPAIR OF GLUTEAL  TENDON;  Surgeon: Shelda Pal, MD;  Location: [facility];  Service: Orthopedics;  Laterality: Right;    reports that she quit smoking about 22 years ago. Her smoking use included Cigarettes. She has a 2.5 pack-year smoking history. She does not have any smokeless tobacco history on file. She reports that she does not drink alcohol or use illicit drugs. family history includes Cancer in her father, mother, and other; Diabetes in her other; Heart disease in her other; Hyperlipidemia in her other; Hypertension in her other; and Kidney disease in her other. Allergies  Allergen Reactions  . Codeine Sulfate     REACTION: GI upset  . Penicillins     REACTION: rash     Review of Systems  Constitutional: Positive for fatigue and unexpected weight change. Negative for appetite change.  Respiratory: Positive for shortness of breath. Negative for cough and wheezing.   Cardiovascular: Positive for leg swelling. Negative for chest pain and palpitations.  Endocrine: Positive for cold intolerance. Negative for polydipsia, polyphagia and polyuria.  Neurological: Negative for dizziness.       Objective:   Physical Exam  Constitutional: She appears well-developed and well-nourished.  Neck: Neck supple. No thyromegaly present.  Cardiovascular: Normal rate and regular rhythm.  Exam reveals no gallop.   Pulmonary/Chest: Effort normal and breath sounds normal. No respiratory distress. She has no wheezes. She has no rales.  Musculoskeletal: She exhibits no edema.  No pitting edema  Neurological: She has normal reflexes.  Skin: No rash noted.          Assessment & Plan:  Patient presents with progressive fatigue and lethargy. By history, suspect probable obstructive sleep apnea. Set up sleep study with split night study. Check TSH to rule out hypothyroidism. Epworth Sleepiness Scale 18.

## 2012-05-12 NOTE — Progress Notes (Signed)
Quick Note:  Pt informed on personally identified VM ______ 

## 2012-06-06 ENCOUNTER — Encounter (HOSPITAL_BASED_OUTPATIENT_CLINIC_OR_DEPARTMENT_OTHER): Payer: BC Managed Care – PPO

## 2012-06-13 ENCOUNTER — Telehealth: Payer: Self-pay | Admitting: Family Medicine

## 2012-06-13 MED ORDER — ZOLPIDEM TARTRATE 10 MG PO TABS: 10.0000 mg | ORAL_TABLET | Freq: Every evening | ORAL | Status: DC | PRN

## 2012-06-13 NOTE — Telephone Encounter (Signed)
Refill OK

## 2012-06-13 NOTE — Telephone Encounter (Signed)
Rx will be faxed to [facility]

## 2012-06-13 NOTE — Telephone Encounter (Signed)
Pt called to request a refill of her zolpidem (AMBIEN) 10 MG tablet. She would like it called into [facility]. Please assist.

## 2012-06-13 NOTE — Telephone Encounter (Signed)
Ambien last filled 02-25-12, #90 with 0 refills

## 2012-06-29 ENCOUNTER — Encounter (HOSPITAL_BASED_OUTPATIENT_CLINIC_OR_DEPARTMENT_OTHER): Payer: BC Managed Care – PPO

## 2012-07-18 DIAGNOSIS — M159 Polyosteoarthritis, unspecified: Secondary | ICD-10-CM | POA: Diagnosis not present

## 2012-07-18 DIAGNOSIS — M76899 Other specified enthesopathies of unspecified lower limb, excluding foot: Secondary | ICD-10-CM | POA: Diagnosis not present

## 2012-07-18 DIAGNOSIS — R51 Headache: Secondary | ICD-10-CM | POA: Diagnosis not present

## 2012-07-18 DIAGNOSIS — M069 Rheumatoid arthritis, unspecified: Secondary | ICD-10-CM | POA: Diagnosis not present

## 2012-07-18 DIAGNOSIS — IMO0001 Reserved for inherently not codable concepts without codable children: Secondary | ICD-10-CM | POA: Diagnosis not present

## 2012-07-18 DIAGNOSIS — J309 Allergic rhinitis, unspecified: Secondary | ICD-10-CM | POA: Diagnosis not present

## 2012-08-16 ENCOUNTER — Ambulatory Visit (HOSPITAL_BASED_OUTPATIENT_CLINIC_OR_DEPARTMENT_OTHER): Payer: BC Managed Care – PPO | Attending: Family Medicine | Admitting: Radiology

## 2012-08-16 VITALS — Ht 64.0 in | Wt 275.0 lb

## 2012-08-16 DIAGNOSIS — G4710 Hypersomnia, unspecified: Secondary | ICD-10-CM | POA: Diagnosis not present

## 2012-08-16 DIAGNOSIS — R259 Unspecified abnormal involuntary movements: Secondary | ICD-10-CM | POA: Insufficient documentation

## 2012-08-16 DIAGNOSIS — G4730 Sleep apnea, unspecified: Secondary | ICD-10-CM

## 2012-08-16 DIAGNOSIS — G4733 Obstructive sleep apnea (adult) (pediatric): Secondary | ICD-10-CM

## 2012-08-16 DIAGNOSIS — R5381 Other malaise: Secondary | ICD-10-CM

## 2012-08-17 DIAGNOSIS — L819 Disorder of pigmentation, unspecified: Secondary | ICD-10-CM | POA: Diagnosis not present

## 2012-08-17 DIAGNOSIS — L570 Actinic keratosis: Secondary | ICD-10-CM | POA: Diagnosis not present

## 2012-08-17 DIAGNOSIS — D485 Neoplasm of uncertain behavior of skin: Secondary | ICD-10-CM | POA: Diagnosis not present

## 2012-08-30 DIAGNOSIS — G4730 Sleep apnea, unspecified: Secondary | ICD-10-CM | POA: Diagnosis not present

## 2012-08-30 DIAGNOSIS — G4710 Hypersomnia, unspecified: Secondary | ICD-10-CM

## 2012-08-30 DIAGNOSIS — G4761 Periodic limb movement disorder: Secondary | ICD-10-CM

## 2012-08-30 NOTE — Procedures (Signed)
[name], [name]               ACCOUNT NO.:  [id number]  MEDICAL RECORD NO.:  [id number]          PATIENT TYPE:  OUT  LOCATION:  SLEEP CENTER                 FACILITY:  [facility]  PHYSICIAN:  Barbaraann Share, MD,FCCPDATE OF BIRTH:  03-07-55  DATE OF STUDY:  08/16/2012                           NOCTURNAL POLYSOMNOGRAM  REFERRING PHYSICIAN:  Evelena Peat, M.D.  INDICATION FOR STUDY:  Hypersomnia with sleep apnea.  EPWORTH SCORE:  14.  SLEEP ARCHITECTURE:  The patient had total sleep time of 260 minutes with decreased slow wave sleep and only 14 minutes of REM.  Sleep onset latency was prolonged at 119 minutes and REM onset was prolonged at 225 minutes.  Sleep efficiency was poor at 66%.  RESPIRATORY DATA:  The patient was found to have one obstructive apnea and 7 obstructive hypopneas, giving her an apnea-hypopnea index of 2 events per hour.  The events occurred in all body positions, and there was moderate snoring noted throughout.  The patient did not meet split night protocol secondary to her small numbers of events.  OXYGEN DATA:  There was transient O2 desaturation as low as 90% during the study.  CARDIAC DATA:  Occasional PAC noted, but no clinically significant arrhythmias were seen.  MOVEMENTS/PARASOMNIA:  The patient was found to have 174 periodic limb movements, with 7 per hour resulting in arousal or awakening.  There were no abnormal behaviors noted.  IMPRESSIONS/RECOMMENDATIONS: 1. Small numbers of obstructive events, which do not meet the AHI     criteria for the obstructive sleep apnea syndrome.  The patient     should be encouraged to work aggressively on weight loss. 2. Occasional PAC noted, but no clinically significant arrhythmias     were seen. 3. Large numbers of periodic limb movements, with 7 per hour resulting     in arousal or awakening.  This is suspicious for a primary movement     disorder of sleep such as the periodic limb movement disorder  or     the     restless legs syndrome.  Perhaps this is the etiology for the     patient's sleep disruption.  Clinical correlation is suggested.     Barbaraann Share, MD,FCCP Diplomate, American Board of Sleep Medicine    [facility]/MEDQ  D:  08/30/2012 07:54:03  T:  08/30/2012 08:05:50  Job:  [id number]

## 2012-09-05 ENCOUNTER — Telehealth: Payer: Self-pay | Admitting: Family Medicine

## 2012-09-05 NOTE — Telephone Encounter (Signed)
PT states that she completed a sleep study on 6/25, and would like to discuss the results with Dr. Caryl Never. Please assist.

## 2012-09-06 NOTE — Telephone Encounter (Signed)
Yes, can you please set patient appointment

## 2012-09-06 NOTE — Telephone Encounter (Signed)
Scheduled

## 2012-09-11 ENCOUNTER — Ambulatory Visit: Payer: BC Managed Care – PPO | Admitting: Family Medicine

## 2012-09-18 ENCOUNTER — Ambulatory Visit (INDEPENDENT_AMBULATORY_CARE_PROVIDER_SITE_OTHER): Payer: BC Managed Care – PPO | Admitting: Family Medicine

## 2012-09-18 ENCOUNTER — Encounter: Payer: Self-pay | Admitting: Family Medicine

## 2012-09-18 VITALS — BP 128/70 | HR 73 | Temp 98.1°F | Wt 277.0 lb

## 2012-09-18 DIAGNOSIS — G2581 Restless legs syndrome: Secondary | ICD-10-CM | POA: Insufficient documentation

## 2012-09-18 MED ORDER — PRAMIPEXOLE DIHYDROCHLORIDE 0.125 MG PO TABS: 0.1250 mg | ORAL_TABLET | Freq: Every day | ORAL | Status: DC

## 2012-09-18 MED ORDER — ZOLPIDEM TARTRATE 10 MG PO TABS: 10.0000 mg | ORAL_TABLET | Freq: Every evening | ORAL | Status: DC | PRN

## 2012-09-18 NOTE — Patient Instructions (Signed)
Restless Legs Syndrome Restless legs syndrome is a movement disorder. It may also be called a sensori-motor disorder.  CAUSES  No one knows what specifically causes restless legs syndrome, but it tends to run in families. It is also more common in people with low iron, in pregnancy, in people who need dialysis, and those with nerve damage (neuropathy).Some medications may make restless legs syndrome worse.Those medications include drugs to treat high blood pressure, some heart conditions, nausea, colds, allergies, and depression. SYMPTOMS Symptoms include uncomfortable sensations in the legs. These leg sensations are worse during periods of inactivity or rest. They are also worse while sitting or lying down. Individuals that have the disorder describe sensations in the legs that feel like:  Pulling.  Drawing.  Crawling.  Worming.  Boring.  Tingling.  Pins and needles.  Prickling.  Pain. The sensations are usually accompanied by an overwhelming urge to move the legs. Sudden muscle jerks may also occur. Movement provides temporary relief from the discomfort. In rare cases, the arms may also be affected. Symptoms may interfere with going to sleep (sleep onset insomnia). Restless legs syndrome may also be related to periodic limb movement disorder (PLMD). PLMD is another more common motor disorder. It also causes interrupted sleep. The symptoms from PLMD usually occur most often when you are awake. TREATMENT  Treatment for restless legs syndrome is symptomatic. This means that the symptoms are treated.   Massage and cold compresses may provide temporary relief.  Walk, stretch, or take a cold or hot bath.  Get regular exercise and a good night's sleep.  Avoid caffeine, alcohol, nicotine, and medications that can make it worse.  Do activities that provide mental stimulation like discussions, needlework, and video games. These may be helpful if you are not able to walk or  stretch. Some medications are effective in relieving the symptoms. However, many of these medications have side effects. Ask your caregiver about medications that may help your symptoms. Correcting iron deficiency may improve symptoms for some patients. Document Released: 01/29/2002 Document Revised: 05/03/2011 Document Reviewed: 05/07/2010 ExitCare Patient Information 2014 [employer]. Insomnia Insomnia is frequent trouble falling and/or staying asleep. Insomnia can be a long term problem or a short term problem. Both are common. Insomnia can be a short term problem when the wakefulness is related to a certain stress or worry. Long term insomnia is often related to ongoing stress during waking hours and/or poor sleeping habits. Overtime, sleep deprivation itself can make the problem worse. Every little thing feels more severe because you are overtired and your ability to cope is decreased. CAUSES   Stress, anxiety, and depression.  Poor sleeping habits.  Distractions such as TV in the bedroom.  Naps close to bedtime.  Engaging in emotionally charged conversations before bed.  Technical reading before sleep.  Alcohol and other sedatives. They may make the problem worse. They can hurt normal sleep patterns and normal dream activity.  Stimulants such as caffeine for several hours prior to bedtime.  Pain syndromes and shortness of breath can cause insomnia.  Exercise late at night.  Changing time zones may cause sleeping problems (jet lag). It is sometimes helpful to have someone observe your sleeping patterns. They should look for periods of not breathing during the night (sleep apnea). They should also look to see how long those periods last. If you live alone or observers are uncertain, you can also be observed at a sleep clinic where your sleep patterns will be professionally monitored. Sleep apnea   requires a checkup and treatment. Give your caregivers your medical history. Give  your caregivers observations your family has made about your sleep.  SYMPTOMS   Not feeling rested in the morning.  Anxiety and restlessness at bedtime.  Difficulty falling and staying asleep. TREATMENT   Your caregiver may prescribe treatment for an underlying medical disorders. Your caregiver can give advice or help if you are using alcohol or other drugs for self-medication. Treatment of underlying problems will usually eliminate insomnia problems.  Medications can be prescribed for short time use. They are generally not recommended for lengthy use.  Over-the-counter sleep medicines are not recommended for lengthy use. They can be habit forming.  You can promote easier sleeping by making lifestyle changes such as:  Using relaxation techniques that help with breathing and reduce muscle tension.  Exercising earlier in the day.  Changing your diet and the time of your last meal. No night time snacks.  Establish a regular time to go to bed.  Counseling can help with stressful problems and worry.  Soothing music and white noise may be helpful if there are background noises you cannot remove.  Stop tedious detailed work at least one hour before bedtime. HOME CARE INSTRUCTIONS   Keep a diary. Inform your caregiver about your progress. This includes any medication side effects. See your caregiver regularly. Take note of:  Times when you are asleep.  Times when you are awake during the night.  The quality of your sleep.  How you feel the next day. This information will help your caregiver care for you.  Get out of bed if you are still awake after 15 minutes. Read or do some quiet activity. Keep the lights down. Wait until you feel sleepy and go back to bed.  Keep regular sleeping and waking hours. Avoid naps.  Exercise regularly.  Avoid distractions at bedtime. Distractions include watching television or engaging in any intense or detailed activity like attempting to  balance the household checkbook.  Develop a bedtime ritual. Keep a familiar routine of bathing, brushing your teeth, climbing into bed at the same time each night, listening to soothing music. Routines increase the success of falling to sleep faster.  Use relaxation techniques. This can be using breathing and muscle tension release routines. It can also include visualizing peaceful scenes. You can also help control troubling or intruding thoughts by keeping your mind occupied with boring or repetitive thoughts like the old concept of counting sheep. You can make it more creative like imagining planting one beautiful flower after another in your backyard garden.  During your day, work to eliminate stress. When this is not possible use some of the previous suggestions to help reduce the anxiety that accompanies stressful situations. MAKE SURE YOU:   Understand these instructions.  Will watch your condition.  Will get help right away if you are not doing well or get worse. Document Released: 02/06/2000 Document Revised: 05/03/2011 Document Reviewed: 03/08/2007 ExitCare Patient Information 2014 [employer].  

## 2012-09-18 NOTE — Progress Notes (Signed)
  Subjective:    Patient ID: [name], female    DOB: April 08, 1955, 57 y.o.   MRN: [id number]  HPI Patient here to discuss recent sleep study results. She had Epworth score of 14 She has significant fatigue and insomnia issues. Increased sleep latency Patient underwent sleep study on 08/16/2012. Small number of obstructive events which did not meet criteria for obstructive sleep apnea syndrome. She did, however, have a large number of periodic limb movements resulting in arousal or awakening. Strong suspicion for primary movement disorder such as restless leg syndrome. Patient does not use much caffeine  She currently takes Ambien for sleep this does not seem to help much recently  Past Medical History  Diagnosis Date  . ANGIOMA 10/03/2008    REMOVED FROM RIGHT LOWER LEG-BENIGN  . HYPERLIPIDEMIA 08/27/2008  . DEPRESSION 12/10/2009  . HYPERTENSION 08/27/2008  . GERD 08/27/2008  . SEBORRHEIC KERATOSIS 08/27/2008  . Arthritis     RA  . Bronchitis     SEASONAL  . Hyperlipemia   . Fibromyalgia    Past Surgical History  Procedure Laterality Date  . Cesarean section  1980  . Cesarean section  1982  . Cholecystectomy  1997  . Abdominal hysterectomy  1995  . Foot surgery  2004, 2005, 2008  . Knee arthroscopy  2007, 2208, 2009  . Nasal sinus surgery  2013  . Joint replacement  10,12    lt total knee  . Breast ductal system excision  08/16/2011    Procedure: EXCISION DUCTAL SYSTEM BREAST;  Surgeon: Clovis Pu. Cornett, MD;  Location: [facility];  Service: General;  Laterality: Left;  left breast duct excision  . Open surgical repair of gluteal tendon  01/17/2012    Procedure: OPEN SURGICAL REPAIR OF GLUTEAL TENDON;  Surgeon: Shelda Pal, MD;  Location: [facility];  Service: Orthopedics;  Laterality: Right;    reports that she quit smoking about 22 years ago. Her smoking use included Cigarettes. She has a 2.5 pack-year smoking history. She does not have any smokeless tobacco  history on file. She reports that she does not drink alcohol or use illicit drugs. family history includes Cancer in her father, mother, and other; Diabetes in her other; Heart disease in her other; Hyperlipidemia in her other; Hypertension in her other; and Kidney disease in her other. Allergies  Allergen Reactions  . Codeine Sulfate     REACTION: GI upset  . Penicillins     REACTION: rash     Review of Systems  Constitutional: Positive for fatigue. Negative for appetite change and unexpected weight change.  Respiratory: Negative for shortness of breath.   Cardiovascular: Negative for chest pain and leg swelling.  Psychiatric/Behavioral: Negative for dysphoric mood.       Objective:   Physical Exam  Constitutional: She appears well-developed and well-nourished.  Neck: Neck supple. No thyromegaly present.  Cardiovascular: Normal rate and regular rhythm.   Pulmonary/Chest: Effort normal and breath sounds normal. No respiratory distress. She has no wheezes. She has no rales.  Musculoskeletal: She exhibits no edema.          Assessment & Plan:  Chronic insomnia. Recent sleep study suggestive of restless leg syndrome. Minimize caffeine. Sleep hygiene discussed. Trial of Mirapex 0.125 mg each bedtime after one week titrate to 2 tablets if necessary. Reassess one month

## 2012-09-27 DIAGNOSIS — M25819 Other specified joint disorders, unspecified shoulder: Secondary | ICD-10-CM | POA: Diagnosis not present

## 2012-10-06 ENCOUNTER — Telehealth: Payer: Self-pay | Admitting: Family Medicine

## 2012-10-06 NOTE — Telephone Encounter (Signed)
Boneta Lucks from [facility] called and stated that the pt has enrolled in case management. Boneta Lucks will be her case manager and if theres anything that you would like her to address with the patient, you may contact her. FYI.

## 2012-10-09 DIAGNOSIS — C4441 Basal cell carcinoma of skin of scalp and neck: Secondary | ICD-10-CM | POA: Diagnosis not present

## 2012-10-17 DIAGNOSIS — L570 Actinic keratosis: Secondary | ICD-10-CM | POA: Diagnosis not present

## 2012-10-18 DIAGNOSIS — IMO0001 Reserved for inherently not codable concepts without codable children: Secondary | ICD-10-CM | POA: Diagnosis not present

## 2012-10-18 DIAGNOSIS — M76899 Other specified enthesopathies of unspecified lower limb, excluding foot: Secondary | ICD-10-CM | POA: Diagnosis not present

## 2012-10-18 DIAGNOSIS — M069 Rheumatoid arthritis, unspecified: Secondary | ICD-10-CM | POA: Diagnosis not present

## 2012-10-18 DIAGNOSIS — M159 Polyosteoarthritis, unspecified: Secondary | ICD-10-CM | POA: Diagnosis not present

## 2012-10-19 ENCOUNTER — Ambulatory Visit (INDEPENDENT_AMBULATORY_CARE_PROVIDER_SITE_OTHER): Payer: BC Managed Care – PPO | Admitting: Family Medicine

## 2012-10-19 ENCOUNTER — Encounter: Payer: Self-pay | Admitting: Family Medicine

## 2012-10-19 VITALS — BP 124/68 | HR 73 | Temp 97.9°F | Wt 279.0 lb

## 2012-10-19 DIAGNOSIS — G2581 Restless legs syndrome: Secondary | ICD-10-CM | POA: Diagnosis not present

## 2012-10-19 MED ORDER — PRAMIPEXOLE DIHYDROCHLORIDE 0.125 MG PO TABS: ORAL_TABLET | ORAL | Status: DC

## 2012-10-19 NOTE — Progress Notes (Signed)
  Subjective:    Patient ID: [name], female    DOB: [birth year], 57 y.o.   MRN: [id number]  HPI Followup regarding restless leg syndrome. Refer to recent notes She had sleep study which did not show any obstructive sleep apnea but did have evidence for probable restless leg syndrome. We started Mirapex 0.125 mg but she has not titrated up. She has not seen much improvement yet. She does not use caffeine much. She recently donated blood so we know that her previous anemia has corrected  Past Medical History  Diagnosis Date  . ANGIOMA 10/03/2008    REMOVED FROM RIGHT LOWER LEG-BENIGN  . HYPERLIPIDEMIA 08/27/2008  . DEPRESSION 12/10/2009  . HYPERTENSION 08/27/2008  . GERD 08/27/2008  . SEBORRHEIC KERATOSIS 08/27/2008  . Arthritis     RA  . Bronchitis     SEASONAL  . Hyperlipemia   . Fibromyalgia    Past Surgical History  Procedure Laterality Date  . Cesarean section  1980  . Cesarean section  1982  . Cholecystectomy  1997  . Abdominal hysterectomy  1995  . Foot surgery  2004, 2005, 2008  . Knee arthroscopy  2007, 2208, 2009  . Nasal sinus surgery  2013  . Joint replacement  10,12    lt total knee  . Breast ductal system excision  08/16/2011    Procedure: EXCISION DUCTAL SYSTEM BREAST;  Surgeon: Clovis Pu. Cornett, MD;  Location: [facility];  Service: General;  Laterality: Left;  left breast duct excision  . Open surgical repair of gluteal tendon  01/17/2012    Procedure: OPEN SURGICAL REPAIR OF GLUTEAL TENDON;  Surgeon: Shelda Pal, MD;  Location: [facility];  Service: Orthopedics;  Laterality: Right;    reports that she quit smoking about 22 years ago. Her smoking use included Cigarettes. She has a 2.5 pack-year smoking history. She does not have any smokeless tobacco history on file. She reports that she does not drink alcohol or use illicit drugs. family history includes Cancer in her father, mother, and other; Diabetes in her other; Heart disease in her other;  Hyperlipidemia in her other; Hypertension in her other; Kidney disease in her other. Allergies  Allergen Reactions  . Codeine Sulfate     REACTION: GI upset  . Penicillins     REACTION: rash      Review of Systems  Constitutional: Negative for fatigue.  Eyes: Negative for visual disturbance.  Respiratory: Negative for cough, chest tightness, shortness of breath and wheezing.   Cardiovascular: Negative for chest pain, palpitations and leg swelling.  Endocrine: Negative for polydipsia and polyuria.  Neurological: Negative for dizziness, seizures, syncope, weakness, light-headedness and headaches.       Objective:   Physical Exam  Constitutional: She appears well-developed and well-nourished.  Cardiovascular: Normal rate and regular rhythm.   Pulmonary/Chest: Effort normal and breath sounds normal. No respiratory distress. She has no wheezes. She has no rales.          Assessment & Plan:  Restless leg syndrome. Titrate Mirapex to 0.25 mg and titrate up 0.125 mg every week until she sees adequate response or until she reaches 0.5 mg.

## 2012-10-19 NOTE — Patient Instructions (Addendum)
Increase Mirapex to two tablets at night.   May increase each week by one additional tablet to total maximum of 4 tablets   Restless Legs Syndrome Restless legs syndrome is a movement disorder. It may also be called a sensori-motor disorder.  CAUSES  No one knows what specifically causes restless legs syndrome, but it tends to run in families. It is also more common in people with low iron, in pregnancy, in people who need dialysis, and those with nerve damage (neuropathy).Some medications may make restless legs syndrome worse.Those medications include drugs to treat high blood pressure, some heart conditions, nausea, colds, allergies, and depression. SYMPTOMS Symptoms include uncomfortable sensations in the legs. These leg sensations are worse during periods of inactivity or rest. They are also worse while sitting or lying down. Individuals that have the disorder describe sensations in the legs that feel like:  Pulling.  Drawing.  Crawling.  Worming.  Boring.  Tingling.  Pins and needles.  Prickling.  Pain. The sensations are usually accompanied by an overwhelming urge to move the legs. Sudden muscle jerks may also occur. Movement provides temporary relief from the discomfort. In rare cases, the arms may also be affected. Symptoms may interfere with going to sleep (sleep onset insomnia). Restless legs syndrome may also be related to periodic limb movement disorder (PLMD). PLMD is another more common motor disorder. It also causes interrupted sleep. The symptoms from PLMD usually occur most often when you are awake. TREATMENT  Treatment for restless legs syndrome is symptomatic. This means that the symptoms are treated.   Massage and cold compresses may provide temporary relief.  Walk, stretch, or take a cold or hot bath.  Get regular exercise and a good night's sleep.  Avoid caffeine, alcohol, nicotine, and medications that can make it worse.  Do activities that provide  mental stimulation like discussions, needlework, and video games. These may be helpful if you are not able to walk or stretch. Some medications are effective in relieving the symptoms. However, many of these medications have side effects. Ask your caregiver about medications that may help your symptoms. Correcting iron deficiency may improve symptoms for some patients. Document Released: 01/29/2002 Document Revised: 05/03/2011 Document Reviewed: 05/07/2010 [facility] Patient Information 2014 [place], [place].

## 2012-11-01 DIAGNOSIS — M7660 Achilles tendinitis, unspecified leg: Secondary | ICD-10-CM | POA: Diagnosis not present

## 2012-11-01 DIAGNOSIS — M7750 Other enthesopathy of unspecified foot: Secondary | ICD-10-CM | POA: Diagnosis not present

## 2012-11-17 DIAGNOSIS — M25579 Pain in unspecified ankle and joints of unspecified foot: Secondary | ICD-10-CM | POA: Diagnosis not present

## 2012-11-30 ENCOUNTER — Other Ambulatory Visit: Payer: Self-pay | Admitting: Family Medicine

## 2012-12-01 ENCOUNTER — Ambulatory Visit (INDEPENDENT_AMBULATORY_CARE_PROVIDER_SITE_OTHER): Payer: BC Managed Care – PPO

## 2012-12-01 ENCOUNTER — Ambulatory Visit (INDEPENDENT_AMBULATORY_CARE_PROVIDER_SITE_OTHER): Payer: BC Managed Care – PPO | Admitting: Family Medicine

## 2012-12-01 DIAGNOSIS — Z23 Encounter for immunization: Secondary | ICD-10-CM

## 2012-12-14 ENCOUNTER — Telehealth: Payer: Self-pay | Admitting: Family Medicine

## 2012-12-14 NOTE — Telephone Encounter (Signed)
Pt had to increase her mirapex to 4 tablets at bedtime. Pt needs new rx call into [facility]

## 2012-12-14 NOTE — Telephone Encounter (Signed)
Pt already has refills at the pharmacy.

## 2012-12-18 DIAGNOSIS — M7660 Achilles tendinitis, unspecified leg: Secondary | ICD-10-CM | POA: Diagnosis not present

## 2012-12-20 ENCOUNTER — Emergency Department (HOSPITAL_COMMUNITY)
Admission: EM | Admit: 2012-12-20 | Discharge: 2012-12-21 | Disposition: A | Discharge status: Home / Self Care | Payer: BC Managed Care – PPO | Attending: Emergency Medicine | Admitting: Emergency Medicine

## 2012-12-20 ENCOUNTER — Emergency Department (HOSPITAL_COMMUNITY): Payer: BC Managed Care – PPO

## 2012-12-20 ENCOUNTER — Encounter (HOSPITAL_COMMUNITY): Payer: Self-pay | Admitting: Emergency Medicine

## 2012-12-20 DIAGNOSIS — J209 Acute bronchitis, unspecified: Secondary | ICD-10-CM | POA: Diagnosis not present

## 2012-12-20 DIAGNOSIS — IMO0001 Reserved for inherently not codable concepts without codable children: Secondary | ICD-10-CM | POA: Diagnosis not present

## 2012-12-20 DIAGNOSIS — E785 Hyperlipidemia, unspecified: Secondary | ICD-10-CM | POA: Diagnosis not present

## 2012-12-20 DIAGNOSIS — F3289 Other specified depressive episodes: Secondary | ICD-10-CM | POA: Diagnosis not present

## 2012-12-20 DIAGNOSIS — M069 Rheumatoid arthritis, unspecified: Secondary | ICD-10-CM | POA: Diagnosis not present

## 2012-12-20 DIAGNOSIS — R062 Wheezing: Secondary | ICD-10-CM | POA: Insufficient documentation

## 2012-12-20 DIAGNOSIS — F329 Major depressive disorder, single episode, unspecified: Secondary | ICD-10-CM | POA: Diagnosis not present

## 2012-12-20 DIAGNOSIS — R509 Fever, unspecified: Secondary | ICD-10-CM | POA: Diagnosis not present

## 2012-12-20 DIAGNOSIS — K219 Gastro-esophageal reflux disease without esophagitis: Secondary | ICD-10-CM | POA: Insufficient documentation

## 2012-12-20 DIAGNOSIS — I10 Essential (primary) hypertension: Secondary | ICD-10-CM | POA: Diagnosis not present

## 2012-12-20 DIAGNOSIS — Z87891 Personal history of nicotine dependence: Secondary | ICD-10-CM | POA: Insufficient documentation

## 2012-12-20 DIAGNOSIS — R51 Headache: Secondary | ICD-10-CM | POA: Insufficient documentation

## 2012-12-20 DIAGNOSIS — Z79899 Other long term (current) drug therapy: Secondary | ICD-10-CM | POA: Insufficient documentation

## 2012-12-20 DIAGNOSIS — Z7982 Long term (current) use of aspirin: Secondary | ICD-10-CM | POA: Diagnosis not present

## 2012-12-20 DIAGNOSIS — Z880 Allergy status to penicillin: Secondary | ICD-10-CM | POA: Insufficient documentation

## 2012-12-20 DIAGNOSIS — IMO0002 Reserved for concepts with insufficient information to code with codable children: Secondary | ICD-10-CM | POA: Diagnosis not present

## 2012-12-20 MED ORDER — ALBUTEROL SULFATE (5 MG/ML) 0.5% IN NEBU
5.0000 mg | INHALATION_SOLUTION | Freq: Once | RESPIRATORY_TRACT | Status: AC
  Administered 2012-12-20: 5 mg via RESPIRATORY_TRACT
  Filled 2012-12-20: qty 1

## 2012-12-20 MED ORDER — ONDANSETRON 4 MG PO TBDP
4.0000 mg | ORAL_TABLET | Freq: Once | ORAL | Status: AC
  Administered 2012-12-20: 4 mg via ORAL
  Filled 2012-12-20: qty 1

## 2012-12-20 MED ORDER — ALBUTEROL SULFATE (5 MG/ML) 0.5% IN NEBU
2.5000 mg | INHALATION_SOLUTION | Freq: Once | RESPIRATORY_TRACT | Status: AC
  Administered 2012-12-20: 2.5 mg via RESPIRATORY_TRACT
  Filled 2012-12-20: qty 0.5

## 2012-12-20 MED ORDER — MAGNESIUM SULFATE 40 MG/ML IJ SOLN
1.0000 g | Freq: Once | INTRAMUSCULAR | Status: AC
  Administered 2012-12-21: 1 g via INTRAVENOUS
  Filled 2012-12-20: qty 50

## 2012-12-20 MED ORDER — ALBUTEROL SULFATE (5 MG/ML) 0.5% IN NEBU
5.0000 mg | INHALATION_SOLUTION | Freq: Once | RESPIRATORY_TRACT | Status: AC
  Administered 2012-12-21: 5 mg via RESPIRATORY_TRACT
  Filled 2012-12-20: qty 1

## 2012-12-20 MED ORDER — PREDNISONE 50 MG PO TABS
60.0000 mg | ORAL_TABLET | Freq: Once | ORAL | Status: AC
  Administered 2012-12-20: 60 mg via ORAL
  Filled 2012-12-20 (×2): qty 1

## 2012-12-20 MED ORDER — IPRATROPIUM BROMIDE 0.02 % IN SOLN
0.5000 mg | Freq: Once | RESPIRATORY_TRACT | Status: AC
  Administered 2012-12-20: 0.5 mg via RESPIRATORY_TRACT
  Filled 2012-12-20: qty 2.5

## 2012-12-20 MED ORDER — HYDROCOD POLST-CHLORPHEN POLST 10-8 MG/5ML PO LQCR
5.0000 mL | Freq: Once | ORAL | Status: AC
  Administered 2012-12-20: 5 mL via ORAL
  Filled 2012-12-20: qty 5

## 2012-12-20 NOTE — ED Notes (Signed)
RT at the bedside setting up for breathing treatment

## 2012-12-20 NOTE — ED Provider Notes (Signed)
CSN: [id number]     Arrival date & time 12/20/12  2140 History   First MD Initiated Contact with Patient 12/20/12 2151     Chief Complaint  Patient presents with  . Cough  . Fever  . Wheezing    Patient is a 57 y.o. female presenting with cough, fever, and wheezing. The history is provided by the patient.  Cough Cough characteristics:  Non-productive Severity:  Moderate Onset quality:  Gradual Duration:  2 days Timing:  Intermittent Progression:  Worsening Chronicity:  Recurrent Smoker: no   Context: weather changes   Context comment:  Hx of bronchitis Relieved by:  Nothing Worsened by:  Nothing tried Associated symptoms: fever, headaches, myalgias and wheezing   Associated symptoms: no chest pain, no eye discharge and no shortness of breath   Associated symptoms comment:  Cough and hoarseness. Risk factors: no chemical exposure and no recent travel   Fever Associated symptoms: cough, headaches and myalgias   Associated symptoms: no chest pain, no confusion and no dysuria   Wheezing Associated symptoms: cough, fever and headaches   Associated symptoms: no chest pain and no shortness of breath     Past Medical History  Diagnosis Date  . ANGIOMA 10/03/2008    REMOVED FROM RIGHT LOWER LEG-BENIGN  . HYPERLIPIDEMIA 08/27/2008  . DEPRESSION 12/10/2009  . HYPERTENSION 08/27/2008  . GERD 08/27/2008  . SEBORRHEIC KERATOSIS 08/27/2008  . Arthritis     RA  . Bronchitis     SEASONAL  . Hyperlipemia   . Fibromyalgia    Past Surgical History  Procedure Laterality Date  . Cesarean section  1980  . Cesarean section  1982  . Cholecystectomy  1997  . Abdominal hysterectomy  1995  . Foot surgery  2004, 2005, 2008  . Knee arthroscopy  2007, 2208, 2009  . Nasal sinus surgery  2013  . Joint replacement  10,12    lt total knee  . Breast ductal system excision  08/16/2011    Procedure: EXCISION DUCTAL SYSTEM BREAST;  Surgeon: Clovis Pu. Cornett, MD;  Location: [facility];   Service: General;  Laterality: Left;  left breast duct excision  . Open surgical repair of gluteal tendon  01/17/2012    Procedure: OPEN SURGICAL REPAIR OF GLUTEAL TENDON;  Surgeon: Shelda Pal, MD;  Location: [facility];  Service: Orthopedics;  Laterality: Right;   Family History  Problem Relation Age of Onset  . Cancer Mother     breast  . Cancer Father     prostate  . Diabetes Other     grandparent  . Heart disease Other     parent  . Kidney disease Other     parent  . Cancer Other     parent  . Hypertension Other   . Hyperlipidemia Other    History  Substance Use Topics  . Smoking status: Former Smoker -- 0.50 packs/day for 5 years    Types: Cigarettes    Quit date: 02/22/1990  . Smokeless tobacco: Never Used  . Alcohol Use: No   OB History   Grav Para Term Preterm Abortions TAB SAB Ect Mult Living                 Review of Systems  Constitutional: Positive for fever. Negative for activity change.       All ROS Neg except as noted in HPI  HENT: Negative for nosebleeds.   Eyes: Negative for photophobia and discharge.  Respiratory: Positive for cough  and wheezing. Negative for shortness of breath.   Cardiovascular: Negative for chest pain and palpitations.  Gastrointestinal: Negative for abdominal pain and blood in stool.  Genitourinary: Negative for dysuria, frequency and hematuria.  Musculoskeletal: Positive for myalgias. Negative for arthralgias, back pain and neck pain.  Skin: Negative.   Neurological: Positive for headaches. Negative for dizziness, seizures and speech difficulty.  Psychiatric/Behavioral: Negative for hallucinations and confusion.    Allergies  Codeine sulfate and Penicillins  Home Medications   Current Outpatient Rx  Name  Route  Sig  Dispense  Refill  . acetaminophen (TYLENOL) 500 MG tablet   Oral   Take 100 mg by mouth every 4 (four) hours.         Marland Kitchen aspirin EC 81 MG tablet   Oral   Take 81 mg by mouth daily.         Marland Kitchen  estradiol (ESTRACE) 1 MG tablet   Oral   Take 1 mg by mouth daily.         . fluticasone (FLONASE) 50 MCG/ACT nasal spray   Nasal   Place 2 sprays into the nose daily.         . folic acid (FOLVITE) 1 MG tablet   Oral   Take 2 mg by mouth daily.          . hydrochlorothiazide (HYDRODIURIL) 25 MG tablet   Oral   Take 25 mg by mouth daily.         Marland Kitchen lisinopril (PRINIVIL,ZESTRIL) 20 MG tablet   Oral   Take 1 tablet (20 mg total) by mouth daily.   90 tablet   3   . omeprazole (PRILOSEC) 20 MG capsule   Oral   Take 20 mg by mouth 2 (two) times daily.         Marland Kitchen oxyCODONE-acetaminophen (PERCOCET/ROXICET) 5-325 MG per tablet   Oral   Take 1 tablet by mouth every 4 (four) hours as needed for pain.         . pramipexole (MIRAPEX) 0.125 MG tablet   Oral   Take 0.75 mg by mouth at bedtime. 3 tablets taken at bedtime         . predniSONE (DELTASONE) 5 MG tablet   Oral   Take 5 mg by mouth daily.          . sertraline (ZOLOFT) 50 MG tablet   Oral   Take 50 mg by mouth daily.         . simvastatin (ZOCOR) 20 MG tablet   Oral   Take 20 mg by mouth every evening.         . zolpidem (AMBIEN) 10 MG tablet   Oral   Take 1 tablet (10 mg total) by mouth at bedtime as needed for sleep.   90 tablet   1   . predniSONE (DELTASONE) 10 MG tablet   Oral   Take 6 tablets (60 mg total) by mouth daily.   30 tablet   0    BP 122/59  Pulse 90  Temp(Src) 98.2 F (36.8 C)  Resp 20  SpO2 99% Physical Exam  Nursing note and vitals reviewed. Constitutional: She is oriented to person, place, and time. She appears well-developed and well-nourished.  Non-toxic appearance.  HENT:  Head: Normocephalic.  Right Ear: Tympanic membrane and external ear normal.  Left Ear: Tympanic membrane and external ear normal.  Hoarseness   Eyes: EOM and lids are normal. Pupils are equal, round, and reactive to  light.  Neck: Normal range of motion. Carotid bruit is not present.   Cardiovascular: Normal rate, regular rhythm, normal heart sounds, intact distal pulses and normal pulses.   Pulmonary/Chest: No respiratory distress. She has wheezes.  Abdominal: Soft. Bowel sounds are normal. There is no tenderness.  Musculoskeletal: Normal range of motion. She exhibits no edema.  Lymphadenopathy:       Head (right side): No submandibular adenopathy present.       Head (left side): No submandibular adenopathy present.    She has no cervical adenopathy.  Neurological: She is alert and oriented to person, place, and time. She has normal strength. No sensory deficit.  Skin: Skin is warm and dry.  Psychiatric: She has a normal mood and affect. Her speech is normal.    ED Course  Procedures (including critical care time)  CRITICAL CARE Performed by: Lyanne Co Total critical care time: 32 Critical care time was exclusive of separately billable procedures and treating other patients. Critical care was necessary to treat or prevent imminent or life-threatening deterioration. Critical care was time spent personally by me on the following activities: development of treatment plan with patient and/or surrogate as well as nursing, discussions with consultants, evaluation of patient's response to treatment, examination of patient, obtaining history from patient or surrogate, ordering and performing treatments and interventions, ordering and review of laboratory studies, ordering and review of radiographic studies, pulse oximetry and re-evaluation of patient's condition.   Labs Review Labs Reviewed  CBC WITH DIFFERENTIAL - Abnormal; Notable for the following:    WBC 11.7 (*)    Hemoglobin 11.6 (*)    Neutro Abs 8.2 (*)    All other components within normal limits  BASIC METABOLIC PANEL - Abnormal; Notable for the following:    Potassium 3.4 (*)    Glucose, Bld 130 (*)    All other components within normal limits  TROPONIN I   Imaging Review Dg Chest 2  View  12/20/2012   CLINICAL DATA:  Cough, fever  EXAM: CHEST - 2 VIEW  COMPARISON:  08/03/2011  FINDINGS: The heart size and mediastinal contours are within normal limits. Both lungs are clear. The visualized skeletal structures are unremarkable. No effusion. Surgical clips in the upper abdomen.  IMPRESSION: No acute cardiopulmonary disease.   Electronically Signed   By: Oley Balm M.D.   On: 12/20/2012 22:52  I personally reviewed the imaging tests through PACS system I reviewed available ER/hospitalization records through the EMR   EKG Interpretation   None       MDM   1. Bronchospasm with bronchitis, acute     Significant SOB on arrival. Will require albuterol atrovent and IV magnesium. Does not need bipap at this time but will need close monitoring  12:36 AM Pt feels some what better at this time, however, not baseline. Still with wheezing. Will place on continuous albuterol neb at this time. Will monitor likely through night in ER with decision to be made in AM regarding admission  2: 16 AM Feeling better after continuous but still with some wheezing. Breathing not back at baseline  2:56 AM Patient feels much better at this time.  She would like to go home.  Discharge home with pulmonology outpatient followup.  Home with prednisone and albuterol.  She understands return to ER for new or worsening symptoms  Lyanne Co, MD 12/21/12 [phone]

## 2012-12-20 NOTE — ED Notes (Signed)
Pt complaining of cough, fever, headache, wheezing, and aching x 2 days. UTD on flu and pneumonia vaccine (~2 weeks ago)

## 2012-12-21 LAB — CBC WITH DIFFERENTIAL/PLATELET
Basophils Absolute: 0 10*3/uL (ref 0.0–0.1)
Basophils Relative: 0 % (ref 0–1)
Eosinophils Absolute: 0.3 10*3/uL (ref 0.0–0.7)
Eosinophils Relative: 3 % (ref 0–5)
HCT: 36.1 % (ref 36.0–46.0)
Hemoglobin: 11.6 g/dL — ABNORMAL LOW (ref 12.0–15.0)
Lymphocytes Relative: 21 % (ref 12–46)
Lymphs Abs: 2.4 10*3/uL (ref 0.7–4.0)
MCH: 27.3 pg (ref 26.0–34.0)
MCHC: 32.1 g/dL (ref 30.0–36.0)
MCV: 84.9 fL (ref 78.0–100.0)
Monocytes Absolute: 0.8 10*3/uL (ref 0.1–1.0)
Monocytes Relative: 7 % (ref 3–12)
Neutro Abs: 8.2 10*3/uL — ABNORMAL HIGH (ref 1.7–7.7)
Neutrophils Relative %: 69 % (ref 43–77)
Platelets: 355 10*3/uL (ref 150–400)
RBC: 4.25 MIL/uL (ref 3.87–5.11)
RDW: 15.4 % (ref 11.5–15.5)
WBC: 11.7 10*3/uL — ABNORMAL HIGH (ref 4.0–10.5)

## 2012-12-21 LAB — BASIC METABOLIC PANEL
BUN: 13 mg/dL (ref 6–23)
CO2: 22 mEq/L (ref 19–32)
Calcium: 9.6 mg/dL (ref 8.4–10.5)
Chloride: 100 mEq/L (ref 96–112)
Creatinine, Ser: 0.65 mg/dL (ref 0.50–1.10)
GFR calc Af Amer: >90 mL/min (ref >90)
GFR calc non Af Amer: >90 mL/min (ref >90)
Glucose, Bld: 130 mg/dL — ABNORMAL HIGH (ref 70–99)
Potassium: 3.4 mEq/L — ABNORMAL LOW (ref 3.5–5.1)
Sodium: 137 mEq/L (ref 135–145)

## 2012-12-21 LAB — TROPONIN I: Troponin I: <0.3 ng/mL (ref <0.30)

## 2012-12-21 MED ORDER — ALBUTEROL SULFATE HFA 108 (90 BASE) MCG/ACT IN AERS
2.0000 | INHALATION_SPRAY | RESPIRATORY_TRACT | Status: DC
  Administered 2012-12-21: 2 via RESPIRATORY_TRACT
  Filled 2012-12-21: qty 6.7

## 2012-12-21 MED ORDER — ALBUTEROL SULFATE (5 MG/ML) 0.5% IN NEBU
15.0000 mg | INHALATION_SOLUTION | Freq: Once | RESPIRATORY_TRACT | Status: AC
  Administered 2012-12-21: 15 mg via RESPIRATORY_TRACT
  Filled 2012-12-21 (×2): qty 3

## 2012-12-21 MED ORDER — PREDNISONE 10 MG PO TABS: 60.0000 mg | ORAL_TABLET | Freq: Every day | ORAL | Status: DC

## 2012-12-22 ENCOUNTER — Ambulatory Visit: Payer: Self-pay | Admitting: Family Medicine

## 2012-12-22 ENCOUNTER — Encounter (HOSPITAL_COMMUNITY): Payer: Self-pay | Admitting: Emergency Medicine

## 2012-12-22 ENCOUNTER — Telehealth: Payer: Self-pay | Admitting: Family Medicine

## 2012-12-22 ENCOUNTER — Emergency Department (HOSPITAL_COMMUNITY)
Admission: EM | Admit: 2012-12-22 | Discharge: 2012-12-22 | Disposition: A | Discharge status: Home / Self Care | Payer: BC Managed Care – PPO | Attending: Emergency Medicine | Admitting: Emergency Medicine

## 2012-12-22 ENCOUNTER — Emergency Department (HOSPITAL_COMMUNITY): Payer: BC Managed Care – PPO

## 2012-12-22 DIAGNOSIS — Z7982 Long term (current) use of aspirin: Secondary | ICD-10-CM | POA: Insufficient documentation

## 2012-12-22 DIAGNOSIS — I10 Essential (primary) hypertension: Secondary | ICD-10-CM | POA: Insufficient documentation

## 2012-12-22 DIAGNOSIS — J069 Acute upper respiratory infection, unspecified: Secondary | ICD-10-CM | POA: Insufficient documentation

## 2012-12-22 DIAGNOSIS — Z872 Personal history of diseases of the skin and subcutaneous tissue: Secondary | ICD-10-CM | POA: Insufficient documentation

## 2012-12-22 DIAGNOSIS — Z880 Allergy status to penicillin: Secondary | ICD-10-CM | POA: Insufficient documentation

## 2012-12-22 DIAGNOSIS — E785 Hyperlipidemia, unspecified: Secondary | ICD-10-CM | POA: Insufficient documentation

## 2012-12-22 DIAGNOSIS — M069 Rheumatoid arthritis, unspecified: Secondary | ICD-10-CM | POA: Insufficient documentation

## 2012-12-22 DIAGNOSIS — K219 Gastro-esophageal reflux disease without esophagitis: Secondary | ICD-10-CM | POA: Insufficient documentation

## 2012-12-22 DIAGNOSIS — Z87891 Personal history of nicotine dependence: Secondary | ICD-10-CM | POA: Insufficient documentation

## 2012-12-22 DIAGNOSIS — F3289 Other specified depressive episodes: Secondary | ICD-10-CM | POA: Insufficient documentation

## 2012-12-22 DIAGNOSIS — Z79899 Other long term (current) drug therapy: Secondary | ICD-10-CM | POA: Insufficient documentation

## 2012-12-22 DIAGNOSIS — IMO0002 Reserved for concepts with insufficient information to code with codable children: Secondary | ICD-10-CM | POA: Insufficient documentation

## 2012-12-22 DIAGNOSIS — F329 Major depressive disorder, single episode, unspecified: Secondary | ICD-10-CM | POA: Insufficient documentation

## 2012-12-22 LAB — CBC WITH DIFFERENTIAL/PLATELET
Basophils Absolute: 0 10*3/uL (ref 0.0–0.1)
Basophils Relative: 0 % (ref 0–1)
Eosinophils Absolute: 0.1 10*3/uL (ref 0.0–0.7)
Eosinophils Relative: 0 % (ref 0–5)
HCT: 34.3 % — ABNORMAL LOW (ref 36.0–46.0)
Hemoglobin: 10.9 g/dL — ABNORMAL LOW (ref 12.0–15.0)
Lymphocytes Relative: 22 % (ref 12–46)
Lymphs Abs: 3.7 10*3/uL (ref 0.7–4.0)
MCH: 27.2 pg (ref 26.0–34.0)
MCHC: 31.8 g/dL (ref 30.0–36.0)
MCV: 85.5 fL (ref 78.0–100.0)
Monocytes Absolute: 1.3 10*3/uL — ABNORMAL HIGH (ref 0.1–1.0)
Monocytes Relative: 8 % (ref 3–12)
Neutro Abs: 11.6 10*3/uL — ABNORMAL HIGH (ref 1.7–7.7)
Neutrophils Relative %: 69 % (ref 43–77)
Platelets: 367 10*3/uL (ref 150–400)
RBC: 4.01 MIL/uL (ref 3.87–5.11)
RDW: 15.5 % (ref 11.5–15.5)
WBC: 16.8 10*3/uL — ABNORMAL HIGH (ref 4.0–10.5)

## 2012-12-22 MED ORDER — METHYLPREDNISOLONE SODIUM SUCC 125 MG IJ SOLR
125.0000 mg | Freq: Once | INTRAMUSCULAR | Status: AC
  Administered 2012-12-22: 125 mg via INTRAVENOUS
  Filled 2012-12-22: qty 2

## 2012-12-22 MED ORDER — ONDANSETRON 4 MG PO TBDP: ORAL_TABLET | ORAL | Status: DC

## 2012-12-22 MED ORDER — IPRATROPIUM BROMIDE 0.02 % IN SOLN
0.5000 mg | Freq: Once | RESPIRATORY_TRACT | Status: AC
  Administered 2012-12-22: 0.5 mg via RESPIRATORY_TRACT
  Filled 2012-12-22: qty 2.5

## 2012-12-22 MED ORDER — RACEPINEPHRINE HCL 2.25 % IN NEBU
0.5000 mL | INHALATION_SOLUTION | Freq: Once | RESPIRATORY_TRACT | Status: AC
  Administered 2012-12-22: 0.5 mL via RESPIRATORY_TRACT
  Filled 2012-12-22: qty 0.5

## 2012-12-22 MED ORDER — LORAZEPAM 2 MG/ML IJ SOLN
1.0000 mg | Freq: Once | INTRAMUSCULAR | Status: AC
  Administered 2012-12-22: 1 mg via INTRAVENOUS
  Filled 2012-12-22: qty 1

## 2012-12-22 MED ORDER — ALBUTEROL SULFATE (5 MG/ML) 0.5% IN NEBU
5.0000 mg | INHALATION_SOLUTION | Freq: Once | RESPIRATORY_TRACT | Status: AC
  Administered 2012-12-22: 5 mg via RESPIRATORY_TRACT
  Filled 2012-12-22: qty 1

## 2012-12-22 MED ORDER — HYDROCOD POLST-CHLORPHEN POLST 10-8 MG/5ML PO LQCR: ORAL | Status: DC

## 2012-12-22 MED ORDER — MOXIFLOXACIN HCL 400 MG PO TABS: 400.0000 mg | ORAL_TABLET | Freq: Every day | ORAL | Status: DC

## 2012-12-22 NOTE — ED Provider Notes (Signed)
CSN: [id number]     Arrival date & time 12/22/12  1343 History  This chart was scribed for Benny Lennert, MD by Bennett Scrape, ED Scribe. This patient was seen in room APA02/APA02 and the patient's care was started at 1:57 PM.   Chief Complaint  Patient presents with  . Shortness of Breath    Patient is a 57 y.o. female presenting with shortness of breath. The history is provided by the patient. No language interpreter was used.  Shortness of Breath Severity:  Moderate Onset quality:  Gradual Duration:  4 days Timing:  Constant Progression:  Worsening Chronicity:  New Context: URI   Relieved by:  Nothing Worsened by:  Nothing tried Ineffective treatments:  Inhaler Associated symptoms: cough and wheezing   Associated symptoms: no abdominal pain, no chest pain, no fever, no headaches and no rash     HPI Comments: [name] is a 57 y.o. female who presents to the Emergency Department complaining of 4 days of gradually worsening, persistent SOB described as having trouble "getting breath in" with associated hoarse voice, wheezing and cough. She was seen in the ED 2 nights ago for the same and was diagnosed with bronchitis. She was discharged with an inhaler and 60 mg Prednisone which she has been using with no improvement. Last dose was at 11 AM today. She denies any fevers as associated symptoms.   Past Medical History  Diagnosis Date  . ANGIOMA 10/03/2008    REMOVED FROM RIGHT LOWER LEG-BENIGN  . HYPERLIPIDEMIA 08/27/2008  . DEPRESSION 12/10/2009  . HYPERTENSION 08/27/2008  . GERD 08/27/2008  . SEBORRHEIC KERATOSIS 08/27/2008  . Arthritis     RA  . Bronchitis     SEASONAL  . Hyperlipemia   . Fibromyalgia    Past Surgical History  Procedure Laterality Date  . Cesarean section  1980  . Cesarean section  1982  . Cholecystectomy  1997  . Abdominal hysterectomy  1995  . Foot surgery  2004, 2005, 2008  . Knee arthroscopy  2007, 2208, 2009  . Nasal sinus surgery  2013  .  Joint replacement  10,12    lt total knee  . Breast ductal system excision  08/16/2011    Procedure: EXCISION DUCTAL SYSTEM BREAST;  Surgeon: Clovis Pu. Cornett, MD;  Location: [facility];  Service: General;  Laterality: Left;  left breast duct excision  . Open surgical repair of gluteal tendon  01/17/2012    Procedure: OPEN SURGICAL REPAIR OF GLUTEAL TENDON;  Surgeon: Shelda Pal, MD;  Location: [facility];  Service: Orthopedics;  Laterality: Right;   Family History  Problem Relation Age of Onset  . Cancer Mother     breast  . Cancer Father     prostate  . Diabetes Other     grandparent  . Heart disease Other     parent  . Kidney disease Other     parent  . Cancer Other     parent  . Hypertension Other   . Hyperlipidemia Other    History  Substance Use Topics  . Smoking status: Former Smoker -- 0.50 packs/day for 5 years    Types: Cigarettes    Quit date: 02/22/1990  . Smokeless tobacco: Never Used  . Alcohol Use: No   No OB history provided.  Review of Systems  Constitutional: Negative for fever, appetite change and fatigue.  HENT: Positive for voice change. Negative for congestion, ear discharge and sinus pressure.   Eyes:  Negative for discharge.  Respiratory: Positive for cough, shortness of breath and wheezing.   Cardiovascular: Negative for chest pain.  Gastrointestinal: Negative for abdominal pain and diarrhea.  Genitourinary: Negative for frequency and hematuria.  Musculoskeletal: Negative for back pain.  Skin: Negative for rash.  Neurological: Negative for seizures and headaches.  Psychiatric/Behavioral: Negative for hallucinations.  All other systems reviewed and are negative.    Allergies  Codeine sulfate and Penicillins  Home Medications   Current Outpatient Rx  Name  Route  Sig  Dispense  Refill  . acetaminophen (TYLENOL) 500 MG tablet   Oral   Take 100 mg by mouth every 4 (four) hours.         Marland Kitchen aspirin EC 81 MG tablet   Oral    Take 81 mg by mouth daily.         Marland Kitchen estradiol (ESTRACE) 1 MG tablet   Oral   Take 1 mg by mouth daily.         . fluticasone (FLONASE) 50 MCG/ACT nasal spray   Nasal   Place 2 sprays into the nose daily.         . folic acid (FOLVITE) 1 MG tablet   Oral   Take 2 mg by mouth daily.          . hydrochlorothiazide (HYDRODIURIL) 25 MG tablet   Oral   Take 25 mg by mouth daily.         Marland Kitchen lisinopril (PRINIVIL,ZESTRIL) 20 MG tablet   Oral   Take 1 tablet (20 mg total) by mouth daily.   90 tablet   3   . omeprazole (PRILOSEC) 20 MG capsule   Oral   Take 20 mg by mouth 2 (two) times daily.         Marland Kitchen oxyCODONE-acetaminophen (PERCOCET/ROXICET) 5-325 MG per tablet   Oral   Take 1 tablet by mouth every 4 (four) hours as needed for pain.         . pramipexole (MIRAPEX) 0.125 MG tablet   Oral   Take 0.75 mg by mouth at bedtime. 3 tablets taken at bedtime         . predniSONE (DELTASONE) 10 MG tablet   Oral   Take 6 tablets (60 mg total) by mouth daily.   30 tablet   0   . predniSONE (DELTASONE) 5 MG tablet   Oral   Take 5 mg by mouth daily.          . sertraline (ZOLOFT) 50 MG tablet   Oral   Take 50 mg by mouth daily.         . simvastatin (ZOCOR) 20 MG tablet   Oral   Take 20 mg by mouth every evening.         . zolpidem (AMBIEN) 10 MG tablet   Oral   Take 1 tablet (10 mg total) by mouth at bedtime as needed for sleep.   90 tablet   1    Triage Vitals: BP 151/95  Pulse 88  Temp(Src) 98 F (36.7 C) (Oral)  Resp 30  Ht 5\' 4"  (1.626 m)  Wt 272 lb (123.378 kg)  BMI 46.67 kg/m2  SpO2 95%  Physical Exam  Nursing note and vitals reviewed. Constitutional: She is oriented to person, place, and time. She appears well-developed and well-nourished.  Hoarse voice   HENT:  Head: Normocephalic and atraumatic.  Eyes: Conjunctivae and EOM are normal. No scleral icterus.  Neck: Neck supple. No thyromegaly  present.  Cardiovascular: Normal rate  and regular rhythm.  Exam reveals no gallop and no friction rub.   No murmur heard. Pulmonary/Chest: Effort normal and breath sounds normal. Stridor present. She has no wheezes. She has no rales. She exhibits no tenderness.  Abdominal: Soft. She exhibits no distension. There is no tenderness. There is no rebound.  Musculoskeletal: Normal range of motion. She exhibits no edema.  Lymphadenopathy:    She has no cervical adenopathy.  Neurological: She is alert and oriented to person, place, and time. She exhibits normal muscle tone. Coordination normal.  Skin: Skin is warm and dry. No rash noted. No erythema.  Psychiatric: She has a normal mood and affect. Her behavior is normal.    ED Course  Procedures (including critical care time)  Medications  albuterol (PROVENTIL) (5 MG/ML) 0.5% nebulizer solution 5 mg (not administered)  ipratropium (ATROVENT) nebulizer solution 0.5 mg (not administered)  LORazepam (ATIVAN) injection 1 mg (not administered)    DIAGNOSTIC STUDIES: Oxygen Saturation is 95% on room air, normal by my interpretation.    COORDINATION OF CARE: 1:59 PM-Discussed treatment plan which includes medications with pt at bedside and pt agreed to plan.   Labs Review Labs Reviewed - No data to display Imaging Review Dg Chest 2 View  12/20/2012   CLINICAL DATA:  Cough, fever  EXAM: CHEST - 2 VIEW  COMPARISON:  08/03/2011  FINDINGS: The heart size and mediastinal contours are within normal limits. Both lungs are clear. The visualized skeletal structures are unremarkable. No effusion. Surgical clips in the upper abdomen.  IMPRESSION: No acute cardiopulmonary disease.   Electronically Signed   By: Oley Balm M.D.   On: 12/20/2012 22:52    EKG Interpretation   None       MDM  No diagnosis found.  The chart was scribed for me under my direct supervision.  I personally performed the history, physical, and medical decision making and all procedures in the evaluation of  this patient.Benny Lennert, MD 12/22/12 [phone]

## 2012-12-22 NOTE — Telephone Encounter (Signed)
Patient Information:  Caller Name: [name]  Phone: [phone]  Patient: [name], [name]  Gender: Female  DOB: April 21, 1955  Age: 57 Years  PCP: Evelena Peat [facility])  Office Follow Up:  Does the office need to follow up with this patient?: No  Instructions For The Office: N/A  RN Note:  Seen in ED at [facility] 12/20/12 and diagnsoed with bronchitis; given 5 breathing treatments, started on prednisone.  States did well 12/21/12 but onset of new coughing with blood in it 12/22/12.  Afebrile.  Denies resp distress.  Emergent symptoms denied per cough protocol; advised appt today.  Appt scheduled 1515 12/22/12 with Dr. Clent Ridges.  krs/can  Symptoms  Reason For Call & Symptoms: Seen in ED at [facility] 12/20/12 and diagnsoed with bronchitis; given 5 breathing treatments, started on prednisone.  Reviewed Health History In EMR: Yes  Reviewed Medications In EMR: Yes  Reviewed Allergies In EMR: Yes  Reviewed Surgeries / Procedures: Yes  Date of Onset of Symptoms: 12/20/2012  Guideline(s) Used:  Cough  Disposition Per Guideline:   See Today in Office  Reason For Disposition Reached:   Coughing up rusty-colored (reddish-brown) or blood-tinged sputum  Advice Given:  N/A  Patient Will Follow Care Advice:  YES  Appointment Scheduled:  12/22/2012 15:15:00 Appointment Scheduled Provider:  Gershon Crane [facility])

## 2012-12-22 NOTE — Telephone Encounter (Signed)
Noted  

## 2012-12-22 NOTE — ED Notes (Signed)
Treated earlier this week for bronchitis.  Used inhaler and took Prednisone 60mg  at approx 11am today.  Reports no relief from inhaler.  Audible wheezing heard with labored breathing.  Worsening sob starting today.

## 2012-12-26 ENCOUNTER — Institutional Professional Consult (permissible substitution): Payer: BC Managed Care – PPO | Admitting: Internal Medicine

## 2013-01-05 DIAGNOSIS — G609 Hereditary and idiopathic neuropathy, unspecified: Secondary | ICD-10-CM | POA: Diagnosis not present

## 2013-01-05 DIAGNOSIS — F339 Major depressive disorder, recurrent, unspecified: Secondary | ICD-10-CM | POA: Diagnosis not present

## 2013-01-05 DIAGNOSIS — M069 Rheumatoid arthritis, unspecified: Secondary | ICD-10-CM | POA: Diagnosis not present

## 2013-01-10 ENCOUNTER — Telehealth: Payer: Self-pay | Admitting: Family Medicine

## 2013-01-10 NOTE — Telephone Encounter (Signed)
Ambien  Last visit 10/19/12 Last refill 09/18/12 #90 1 refill

## 2013-01-10 NOTE — Telephone Encounter (Signed)
Pt request refill of: pramipexole (MIRAPEX) 0.125 MG tablet (pt up to 3 pills /day)  #360 90 day supply w/ refills zolpidem (AMBIEN) 10 MG tablet  90 day  1/day lisinopril (PRINIVIL,ZESTRIL) 20 MG tablet 90 day  1/day  Pt has NEW pharm:  [facility]  Member ID [id number]

## 2013-01-10 NOTE — Telephone Encounter (Signed)
Refill for 6 months. 

## 2013-01-11 MED ORDER — PRAMIPEXOLE DIHYDROCHLORIDE 0.125 MG PO TABS: 0.3750 mg | ORAL_TABLET | Freq: Every day | ORAL | Status: DC

## 2013-01-11 MED ORDER — ZOLPIDEM TARTRATE 10 MG PO TABS: 10.0000 mg | ORAL_TABLET | Freq: Every evening | ORAL | Status: DC | PRN

## 2013-01-11 MED ORDER — LISINOPRIL 20 MG PO TABS: 20.0000 mg | ORAL_TABLET | Freq: Every day | ORAL | Status: DC

## 2013-01-11 NOTE — Telephone Encounter (Signed)
Sent and Faxed RX to [facility]

## 2013-01-12 DIAGNOSIS — M25579 Pain in unspecified ankle and joints of unspecified foot: Secondary | ICD-10-CM | POA: Diagnosis not present

## 2013-01-16 DIAGNOSIS — Z1211 Encounter for screening for malignant neoplasm of colon: Secondary | ICD-10-CM | POA: Diagnosis not present

## 2013-01-16 DIAGNOSIS — K625 Hemorrhage of anus and rectum: Secondary | ICD-10-CM | POA: Diagnosis not present

## 2013-01-16 DIAGNOSIS — K5900 Constipation, unspecified: Secondary | ICD-10-CM | POA: Diagnosis not present

## 2013-01-16 DIAGNOSIS — Z800 Family history of malignant neoplasm of digestive organs: Secondary | ICD-10-CM | POA: Diagnosis not present

## 2013-01-23 DIAGNOSIS — IMO0001 Reserved for inherently not codable concepts without codable children: Secondary | ICD-10-CM | POA: Diagnosis not present

## 2013-01-23 DIAGNOSIS — M76899 Other specified enthesopathies of unspecified lower limb, excluding foot: Secondary | ICD-10-CM | POA: Diagnosis not present

## 2013-01-23 DIAGNOSIS — M069 Rheumatoid arthritis, unspecified: Secondary | ICD-10-CM | POA: Diagnosis not present

## 2013-01-23 DIAGNOSIS — M159 Polyosteoarthritis, unspecified: Secondary | ICD-10-CM | POA: Diagnosis not present

## 2013-02-17 ENCOUNTER — Inpatient Hospital Stay (HOSPITAL_COMMUNITY)
Admission: EM | Admit: 2013-02-17 | Discharge: 2013-02-19 | DRG: 194 | Disposition: A | Discharge status: Home / Self Care | Payer: BC Managed Care – PPO | Attending: Family Medicine | Admitting: Family Medicine

## 2013-02-17 ENCOUNTER — Emergency Department (HOSPITAL_COMMUNITY): Payer: BC Managed Care – PPO

## 2013-02-17 ENCOUNTER — Encounter (HOSPITAL_COMMUNITY): Payer: Self-pay | Admitting: Emergency Medicine

## 2013-02-17 DIAGNOSIS — E785 Hyperlipidemia, unspecified: Secondary | ICD-10-CM | POA: Diagnosis not present

## 2013-02-17 DIAGNOSIS — M545 Low back pain, unspecified: Secondary | ICD-10-CM | POA: Diagnosis present

## 2013-02-17 DIAGNOSIS — Z79899 Other long term (current) drug therapy: Secondary | ICD-10-CM

## 2013-02-17 DIAGNOSIS — Z7982 Long term (current) use of aspirin: Secondary | ICD-10-CM | POA: Diagnosis not present

## 2013-02-17 DIAGNOSIS — G8929 Other chronic pain: Secondary | ICD-10-CM | POA: Diagnosis present

## 2013-02-17 DIAGNOSIS — G4700 Insomnia, unspecified: Secondary | ICD-10-CM | POA: Diagnosis present

## 2013-02-17 DIAGNOSIS — F3289 Other specified depressive episodes: Secondary | ICD-10-CM | POA: Diagnosis not present

## 2013-02-17 DIAGNOSIS — Z833 Family history of diabetes mellitus: Secondary | ICD-10-CM | POA: Diagnosis not present

## 2013-02-17 DIAGNOSIS — I10 Essential (primary) hypertension: Secondary | ICD-10-CM | POA: Diagnosis not present

## 2013-02-17 DIAGNOSIS — E7800 Pure hypercholesterolemia, unspecified: Secondary | ICD-10-CM | POA: Diagnosis present

## 2013-02-17 DIAGNOSIS — IMO0001 Reserved for inherently not codable concepts without codable children: Secondary | ICD-10-CM | POA: Diagnosis present

## 2013-02-17 DIAGNOSIS — K219 Gastro-esophageal reflux disease without esophagitis: Secondary | ICD-10-CM

## 2013-02-17 DIAGNOSIS — Z96659 Presence of unspecified artificial knee joint: Secondary | ICD-10-CM | POA: Diagnosis not present

## 2013-02-17 DIAGNOSIS — Z6841 Body Mass Index (BMI) 40.0 and over, adult: Secondary | ICD-10-CM | POA: Diagnosis not present

## 2013-02-17 DIAGNOSIS — IMO0002 Reserved for concepts with insufficient information to code with codable children: Secondary | ICD-10-CM | POA: Diagnosis not present

## 2013-02-17 DIAGNOSIS — F5104 Psychophysiologic insomnia: Secondary | ICD-10-CM

## 2013-02-17 DIAGNOSIS — Z87891 Personal history of nicotine dependence: Secondary | ICD-10-CM | POA: Diagnosis not present

## 2013-02-17 DIAGNOSIS — F329 Major depressive disorder, single episode, unspecified: Secondary | ICD-10-CM | POA: Diagnosis present

## 2013-02-17 DIAGNOSIS — M069 Rheumatoid arthritis, unspecified: Secondary | ICD-10-CM | POA: Diagnosis present

## 2013-02-17 DIAGNOSIS — E876 Hypokalemia: Secondary | ICD-10-CM | POA: Diagnosis present

## 2013-02-17 DIAGNOSIS — R51 Headache: Secondary | ICD-10-CM | POA: Diagnosis present

## 2013-02-17 DIAGNOSIS — J189 Pneumonia, unspecified organism: Principal | ICD-10-CM

## 2013-02-17 DIAGNOSIS — R062 Wheezing: Secondary | ICD-10-CM | POA: Diagnosis not present

## 2013-02-17 LAB — BASIC METABOLIC PANEL
BUN: 11 mg/dL (ref 6–23)
CO2: 22 mEq/L (ref 19–32)
Calcium: 8.3 mg/dL — ABNORMAL LOW (ref 8.4–10.5)
Chloride: 102 mEq/L (ref 96–112)
Creatinine, Ser: 0.57 mg/dL (ref 0.50–1.10)
GFR calc Af Amer: >90 mL/min (ref >90)
GFR calc non Af Amer: >90 mL/min (ref >90)
Glucose, Bld: 140 mg/dL — ABNORMAL HIGH (ref 70–99)
Potassium: 3.1 mEq/L — ABNORMAL LOW (ref 3.5–5.1)
Sodium: 138 mEq/L (ref 135–145)

## 2013-02-17 LAB — URINALYSIS, ROUTINE W REFLEX MICROSCOPIC
Bilirubin Urine: NEGATIVE
Glucose, UA: NEGATIVE mg/dL
Ketones, ur: >80 mg/dL — AB
Leukocytes, UA: NEGATIVE
Nitrite: NEGATIVE
Protein, ur: NEGATIVE mg/dL
Specific Gravity, Urine: 1.02 (ref 1.005–1.030)
Urobilinogen, UA: 0.2 mg/dL (ref 0.0–1.0)
pH: 7 (ref 5.0–8.0)

## 2013-02-17 LAB — CBC WITH DIFFERENTIAL/PLATELET
Basophils Absolute: 0 10*3/uL (ref 0.0–0.1)
Basophils Relative: 0 % (ref 0–1)
Eosinophils Absolute: 0 10*3/uL (ref 0.0–0.7)
Eosinophils Relative: 0 % (ref 0–5)
HCT: 30.5 % — ABNORMAL LOW (ref 36.0–46.0)
Hemoglobin: 9.5 g/dL — ABNORMAL LOW (ref 12.0–15.0)
Lymphocytes Relative: 12 % (ref 12–46)
Lymphs Abs: 1.1 10*3/uL (ref 0.7–4.0)
MCH: 26.5 pg (ref 26.0–34.0)
MCHC: 31.1 g/dL (ref 30.0–36.0)
MCV: 85 fL (ref 78.0–100.0)
Monocytes Absolute: 0.7 10*3/uL (ref 0.1–1.0)
Monocytes Relative: 7 % (ref 3–12)
Neutro Abs: 7.9 10*3/uL — ABNORMAL HIGH (ref 1.7–7.7)
Neutrophils Relative %: 81 % — ABNORMAL HIGH (ref 43–77)
Platelets: 276 10*3/uL (ref 150–400)
RBC: 3.59 MIL/uL — ABNORMAL LOW (ref 3.87–5.11)
RDW: 16.5 % — ABNORMAL HIGH (ref 11.5–15.5)
WBC: 9.8 10*3/uL (ref 4.0–10.5)

## 2013-02-17 LAB — INFLUENZA PANEL BY PCR (TYPE A & B)
H1N1 flu by pcr: NOT DETECTED
Influenza A By PCR: NEGATIVE
Influenza B By PCR: NEGATIVE

## 2013-02-17 LAB — URINE MICROSCOPIC-ADD ON

## 2013-02-17 MED ORDER — ALBUTEROL SULFATE (5 MG/ML) 0.5% IN NEBU
2.5000 mg | INHALATION_SOLUTION | Freq: Once | RESPIRATORY_TRACT | Status: AC
  Administered 2013-02-17: 2.5 mg via RESPIRATORY_TRACT
  Filled 2013-02-17: qty 0.5

## 2013-02-17 MED ORDER — FOLIC ACID 1 MG PO TABS
2.0000 mg | ORAL_TABLET | Freq: Every day | ORAL | Status: DC
  Administered 2013-02-17 – 2013-02-19 (×3): 2 mg via ORAL
  Filled 2013-02-17 (×3): qty 2

## 2013-02-17 MED ORDER — SODIUM CHLORIDE 0.9 % IV BOLUS (SEPSIS)
1000.0000 mL | Freq: Once | INTRAVENOUS | Status: AC
  Administered 2013-02-17: 1000 mL via INTRAVENOUS

## 2013-02-17 MED ORDER — SIMVASTATIN 20 MG PO TABS
20.0000 mg | ORAL_TABLET | Freq: Every evening | ORAL | Status: DC
  Administered 2013-02-17 – 2013-02-18 (×2): 20 mg via ORAL
  Filled 2013-02-17 (×2): qty 1

## 2013-02-17 MED ORDER — POLYETHYLENE GLYCOL 3350 17 G PO PACK: 17.0000 g | PACK | Freq: Every day | ORAL | Status: DC | PRN

## 2013-02-17 MED ORDER — DEXTROSE 5 % IV SOLN
INTRAVENOUS | Status: AC
  Filled 2013-02-17: qty 10

## 2013-02-17 MED ORDER — PREDNISONE 20 MG PO TABS
20.0000 mg | ORAL_TABLET | Freq: Every day | ORAL | Status: DC
  Administered 2013-02-18 – 2013-02-19 (×2): 20 mg via ORAL
  Filled 2013-02-17: qty 1
  Filled 2013-02-17: qty 2

## 2013-02-17 MED ORDER — PANTOPRAZOLE SODIUM 40 MG PO TBEC
40.0000 mg | DELAYED_RELEASE_TABLET | Freq: Every day | ORAL | Status: DC
  Administered 2013-02-17 – 2013-02-19 (×3): 40 mg via ORAL
  Filled 2013-02-17 (×4): qty 1

## 2013-02-17 MED ORDER — ONDANSETRON HCL 4 MG PO TABS: 4.0000 mg | ORAL_TABLET | Freq: Four times a day (QID) | ORAL | Status: DC | PRN

## 2013-02-17 MED ORDER — HEPARIN SODIUM (PORCINE) 5000 UNIT/ML IJ SOLN: 5000.0000 [IU] | Freq: Three times a day (TID) | INTRAMUSCULAR | Status: DC

## 2013-02-17 MED ORDER — DEXTROSE 5 % IV SOLN
500.0000 mg | INTRAVENOUS | Status: DC
  Administered 2013-02-17: 500 mg via INTRAVENOUS
  Filled 2013-02-17 (×2): qty 500

## 2013-02-17 MED ORDER — PREDNISONE 20 MG PO TABS: 20.0000 mg | ORAL_TABLET | Freq: Every day | ORAL | Status: DC

## 2013-02-17 MED ORDER — GUAIFENESIN-DM 100-10 MG/5ML PO SYRP: 5.0000 mL | ORAL_SOLUTION | ORAL | Status: DC | PRN

## 2013-02-17 MED ORDER — ONDANSETRON 4 MG PO TBDP
4.0000 mg | ORAL_TABLET | Freq: Once | ORAL | Status: AC
  Administered 2013-02-17: 4 mg via ORAL
  Filled 2013-02-17: qty 1

## 2013-02-17 MED ORDER — ONDANSETRON HCL 4 MG/2ML IJ SOLN
4.0000 mg | Freq: Four times a day (QID) | INTRAMUSCULAR | Status: DC | PRN
  Administered 2013-02-17: 4 mg via INTRAVENOUS
  Filled 2013-02-17: qty 2

## 2013-02-17 MED ORDER — OSELTAMIVIR PHOSPHATE 75 MG PO CAPS
75.0000 mg | ORAL_CAPSULE | Freq: Two times a day (BID) | ORAL | Status: DC
  Administered 2013-02-18: 75 mg via ORAL
  Filled 2013-02-17 (×2): qty 1

## 2013-02-17 MED ORDER — POTASSIUM CHLORIDE CRYS ER 20 MEQ PO TBCR
40.0000 meq | EXTENDED_RELEASE_TABLET | Freq: Once | ORAL | Status: AC
  Administered 2013-02-17: 40 meq via ORAL
  Filled 2013-02-17: qty 2

## 2013-02-17 MED ORDER — ZOLPIDEM TARTRATE 5 MG PO TABS
5.0000 mg | ORAL_TABLET | Freq: Every evening | ORAL | Status: DC | PRN
  Administered 2013-02-17 – 2013-02-18 (×2): 5 mg via ORAL
  Filled 2013-02-17 (×2): qty 1

## 2013-02-17 MED ORDER — LEVOFLOXACIN IN D5W 750 MG/150ML IV SOLN
750.0000 mg | Freq: Once | INTRAVENOUS | Status: DC
  Administered 2013-02-17: 750 mg via INTRAVENOUS
  Filled 2013-02-17: qty 150

## 2013-02-17 MED ORDER — DEXTROSE 5 % IV SOLN
INTRAVENOUS | Status: AC
  Filled 2013-02-17: qty 500

## 2013-02-17 MED ORDER — PRAMIPEXOLE DIHYDROCHLORIDE 0.25 MG PO TABS
0.3750 mg | ORAL_TABLET | Freq: Every day | ORAL | Status: DC
  Filled 2013-02-17 (×2): qty 1

## 2013-02-17 MED ORDER — ESTRADIOL 1 MG PO TABS
1.0000 mg | ORAL_TABLET | Freq: Every day | ORAL | Status: DC
  Administered 2013-02-18 – 2013-02-19 (×2): 1 mg via ORAL
  Filled 2013-02-17 (×5): qty 1

## 2013-02-17 MED ORDER — HEPARIN SODIUM (PORCINE) 5000 UNIT/ML IJ SOLN
5000.0000 [IU] | Freq: Three times a day (TID) | INTRAMUSCULAR | Status: DC
  Administered 2013-02-17 – 2013-02-19 (×5): 5000 [IU] via SUBCUTANEOUS
  Filled 2013-02-17 (×5): qty 1

## 2013-02-17 MED ORDER — IBUPROFEN 400 MG PO TABS
600.0000 mg | ORAL_TABLET | Freq: Once | ORAL | Status: AC
  Administered 2013-02-17: 600 mg via ORAL
  Filled 2013-02-17 (×2): qty 2

## 2013-02-17 MED ORDER — ONDANSETRON HCL 4 MG/2ML IJ SOLN
INTRAMUSCULAR | Status: AC
  Administered 2013-02-17: 4 mg
  Filled 2013-02-17: qty 2

## 2013-02-17 MED ORDER — ONDANSETRON HCL 4 MG/2ML IJ SOLN: 4.0000 mg | Freq: Once | INTRAMUSCULAR | Status: DC

## 2013-02-17 MED ORDER — POTASSIUM CHLORIDE IN NACL 40-0.9 MEQ/L-% IV SOLN
INTRAVENOUS | Status: AC
  Administered 2013-02-17 – 2013-02-18 (×2): via INTRAVENOUS
  Filled 2013-02-17 (×2): qty 1000

## 2013-02-17 MED ORDER — ASPIRIN EC 81 MG PO TBEC
81.0000 mg | DELAYED_RELEASE_TABLET | Freq: Every day | ORAL | Status: DC
Start: 2013-02-17 — End: 2013-02-19
  Administered 2013-02-17 – 2013-02-19 (×3): 81 mg via ORAL
  Filled 2013-02-17 (×3): qty 1

## 2013-02-17 MED ORDER — ALBUTEROL SULFATE (5 MG/ML) 0.5% IN NEBU
2.5000 mg | INHALATION_SOLUTION | RESPIRATORY_TRACT | Status: DC | PRN
  Administered 2013-02-17: 2.5 mg via RESPIRATORY_TRACT
  Filled 2013-02-17: qty 0.5

## 2013-02-17 MED ORDER — ALBUTEROL SULFATE (5 MG/ML) 0.5% IN NEBU
5.0000 mg | INHALATION_SOLUTION | Freq: Once | RESPIRATORY_TRACT | Status: AC
  Administered 2013-02-17: 5 mg via RESPIRATORY_TRACT
  Filled 2013-02-17: qty 1

## 2013-02-17 MED ORDER — DEXTROSE 5 % IV SOLN
1.0000 g | INTRAVENOUS | Status: DC
  Filled 2013-02-17: qty 10

## 2013-02-17 MED ORDER — HYDROCODONE-ACETAMINOPHEN 5-325 MG PO TABS: 1.0000 | ORAL_TABLET | ORAL | Status: DC | PRN

## 2013-02-17 MED ORDER — DEXTROSE 5 % IV SOLN
500.0000 mg | INTRAVENOUS | Status: DC
  Filled 2013-02-17: qty 500

## 2013-02-17 MED ORDER — SERTRALINE HCL 50 MG PO TABS
50.0000 mg | ORAL_TABLET | Freq: Every day | ORAL | Status: DC
Start: 2013-02-17 — End: 2013-02-19
  Administered 2013-02-17 – 2013-02-19 (×3): 50 mg via ORAL
  Filled 2013-02-17 (×3): qty 1

## 2013-02-17 MED ORDER — DEXTROSE 5 % IV SOLN
1.0000 g | INTRAVENOUS | Status: DC
  Administered 2013-02-17: 1 g via INTRAVENOUS
  Filled 2013-02-17 (×2): qty 10

## 2013-02-17 NOTE — ED Notes (Signed)
Lab at bedside attempting to obtain blood cultures.

## 2013-02-17 NOTE — ED Notes (Signed)
Pt. Cont. To await bed assignment, floor and ac notified. Pt resting in bed, voices no complaints.

## 2013-02-17 NOTE — H&P (Signed)
[facility]                                                                                    Patient Demographics  [name], is a 57 y.o. female  MRN: [id number]   DOB - 03-24-1955  Admit Date - 02/17/2013  Outpatient Primary MD for the patient is Kristian Covey, MD   With History of -  Past Medical History  Diagnosis Date  . ANGIOMA 10/03/2008    REMOVED FROM RIGHT LOWER LEG-BENIGN  . HYPERLIPIDEMIA 08/27/2008  . DEPRESSION 12/10/2009  . HYPERTENSION 08/27/2008  . GERD 08/27/2008  . SEBORRHEIC KERATOSIS 08/27/2008  . Arthritis     RA  . Bronchitis     SEASONAL  . Hyperlipemia   . Fibromyalgia       Past Surgical History  Procedure Laterality Date  . Cesarean section  1980  . Cesarean section  1982  . Cholecystectomy  1997  . Abdominal hysterectomy  1995  . Foot surgery  2004, 2005, 2008  . Knee arthroscopy  2007, 2208, 2009  . Nasal sinus surgery  2013  . Joint replacement  10,12    lt total knee  . Breast ductal system excision  08/16/2011    Procedure: EXCISION DUCTAL SYSTEM BREAST;  Surgeon: Clovis Pu. Cornett, MD;  Location: [facility];  Service: General;  Laterality: Left;  left breast duct excision  . Open surgical repair of gluteal tendon  01/17/2012    Procedure: OPEN SURGICAL REPAIR OF GLUTEAL TENDON;  Surgeon: Shelda Pal, MD;  Location: [facility];  Service: Orthopedics;  Laterality: Right;    in for   Chief Complaint  Patient presents with  . Wheezing  . Cough     HPI  [name]  is a 57 y.o. female, history of rheumatoid arthritis who is on 5 mg of prednisone along with methotrexate and Embrel sees Dr. Dierdre Forth for rheumatological problems, morbid obesity, hypertension, GERD, dyslipidemia comes to the hospital with 4-5 day history of dry cough, fever chills, body aches and mild generalized headaches, symptoms were not getting better so she presented to the ER where she was found to be febrile with leukocytosis, chest x-ray  was suggestive of bibasilar infiltrate and UA was 30 although specimen was not the best correction. She was diagnosed with community-acquired pneumonia along with possible UTI and I was called to admit the patient.   She denies any chest pain, no abdominal pain, mild nausea, no diarrhea, no blood or mucus in stool, no new joint pains or aches no focal weakness.    Review of Systems    In addition to the HPI above,   Positive Fever-chills, and body aches +ve mild generalized Headache, No changes with Vision or hearing, No problems swallowing food or Liquids, No Chest pain, positive cough Cough mild Shortness of Breath, No Abdominal pain, mild Nausea but no Vommitting, Bowel movements are regular, No Blood in stool or Urine, No dysuria, No new skin rashes or bruises, No new joints pains-aches,  No new weakness, tingling, numbness in any extremity, No recent weight gain or loss, No polyuria, polydypsia or polyphagia, No significant Mental  Stressors.  A full 10 point Review of Systems was done, except as stated above, all other Review of Systems were negative.   Social History History  Substance Use Topics  . Smoking status: Former Smoker -- 0.50 packs/day for 5 years    Types: Cigarettes    Quit date: 02/22/1990  . Smokeless tobacco: Never Used  . Alcohol Use: No      Family History Family History  Problem Relation Age of Onset  . Cancer Mother     breast  . Cancer Father     prostate  . Diabetes Other     grandparent  . Heart disease Other     parent  . Kidney disease Other     parent  . Cancer Other     parent  . Hypertension Other   . Hyperlipidemia Other       Prior to Admission medications   Medication Sig Start Date End Date Taking? Authorizing Provider  acetaminophen (TYLENOL) 500 MG tablet Take 1,000 mg by mouth every 6 (six) hours as needed for pain.    Yes Historical Provider, MD  aspirin EC 81 MG tablet Take 81 mg by mouth daily.   Yes Historical  Provider, MD  estradiol (ESTRACE) 1 MG tablet Take 1 mg by mouth daily.   Yes Historical Provider, MD  etanercept (ENBREL) 50 MG/ML injection Inject 50 mg into the skin once a week.   Yes Historical Provider, MD  fluticasone (FLONASE) 50 MCG/ACT nasal spray Place 2 sprays into the nose daily as needed for allergies.    Yes Historical Provider, MD  folic acid (FOLVITE) 1 MG tablet Take 2 mg by mouth daily.    Yes Historical Provider, MD  hydrochlorothiazide (HYDRODIURIL) 25 MG tablet Take 25 mg by mouth daily.   Yes Historical Provider, MD  lisinopril (PRINIVIL,ZESTRIL) 20 MG tablet Take 1 tablet (20 mg total) by mouth daily. 01/11/13  Yes Kristian Covey, MD  Methotrexate, Anti-Rheumatic, (METHOTREXATE, PF, [place]) Inject 1 mg into the skin once a week.    Yes Historical Provider, MD  omeprazole (PRILOSEC) 20 MG capsule Take 20 mg by mouth 2 (two) times daily.   Yes Historical Provider, MD  oxyCODONE-acetaminophen (PERCOCET/ROXICET) 5-325 MG per tablet Take 1 tablet by mouth every 4 (four) hours as needed for pain.   Yes Historical Provider, MD  pramipexole (MIRAPEX) 0.125 MG tablet Take 3 tablets (0.375 mg total) by mouth at bedtime. 01/11/13  Yes Kristian Covey, MD  predniSONE (DELTASONE) 5 MG tablet Take 5 mg by mouth daily with breakfast.   Yes Historical Provider, MD  sertraline (ZOLOFT) 50 MG tablet Take 50 mg by mouth daily.   Yes Historical Provider, MD  simvastatin (ZOCOR) 20 MG tablet Take 20 mg by mouth every evening.   Yes Historical Provider, MD  zolpidem (AMBIEN) 10 MG tablet Take 1 tablet (10 mg total) by mouth at bedtime as needed for sleep. 01/11/13  Yes Kristian Covey, MD    Allergies  Allergen Reactions  . Codeine Sulfate Other (See Comments)    GI upset  . Penicillins Rash    Physical Exam  Vitals  Blood pressure 123/90, pulse 77, temperature 98.2 F (36.8 C), temperature source Oral, resp. rate 18, weight 124.739 kg (275 lb), SpO2 97.00%.   1. General   relation obese white female lying in bed in distress due to nausea  2. Normal affect and insight, Not Suicidal or Homicidal, Awake Alert, Oriented X 3.  3.  No F.N deficits, ALL C.Nerves Intact, Strength 5/5 all 4 extremities, Sensation intact all 4 extremities, Plantars down going.  4. Ears and Eyes appear Normal, Conjunctivae clear, PERRLA. Moist Oral Mucosa.  5. Supple Neck, No JVD, No cervical lymphadenopathy appriciated, No Carotid Bruits.  6. Symmetrical Chest wall movement, Good air movement bilaterally, bi basilar rales  7. RRR, No Gallops, Rubs or Murmurs, No Parasternal Heave.  8. Positive Bowel Sounds, Abdomen Soft, Non tender, No organomegaly appriciated,No rebound -guarding or rigidity.  9.  No Cyanosis, Normal Skin Turgor, No Skin Rash or Bruise.  10. Good muscle tone,  joints appear normal , no effusions, Normal ROM.  11. No Palpable Lymph Nodes in Neck or Axillae     Data Review  CBC  Recent Labs Lab 02/17/13 1635  WBC 9.8  HGB 9.5*  HCT 30.5*  PLT 276  MCV 85.0  MCH 26.5  MCHC 31.1  RDW 16.5*  LYMPHSABS 1.1  MONOABS 0.7  EOSABS 0.0  BASOSABS 0.0   ------------------------------------------------------------------------------------------------------------------  Chemistries   Recent Labs Lab 02/17/13 1635  NA 138  K 3.1*  CL 102  CO2 22  GLUCOSE 140*  BUN 11  CREATININE 0.57  CALCIUM 8.3*   ------------------------------------------------------------------------------------------------------------------ CrCl is unknown because both a height and weight (above a minimum accepted value) are required for this calculation. ------------------------------------------------------------------------------------------------------------------ No results found for this basename: TSH, T4TOTAL, FREET3, T3FREE, THYROIDAB,  in the last 72 hours   Coagulation profile No results found for this basename: INR, PROTIME,  in the last 168  hours ------------------------------------------------------------------------------------------------------------------- No results found for this basename: DDIMER,  in the last 72 hours -------------------------------------------------------------------------------------------------------------------  Cardiac Enzymes No results found for this basename: CK, CKMB, TROPONINI, MYOGLOBIN,  in the last 168 hours ------------------------------------------------------------------------------------------------------------------ No components found with this basename: POCBNP,    ---------------------------------------------------------------------------------------------------------------  Urinalysis    Component Value Date/Time   COLORURINE YELLOW 02/17/2013 1524   APPEARANCEUR HAZY* 02/17/2013 1524   LABSPEC 1.020 02/17/2013 1524   PHURINE 7.0 02/17/2013 1524   GLUCOSEU NEGATIVE 02/17/2013 1524   HGBUR TRACE* 02/17/2013 1524   HGBUR 2+ 08/19/2009 0949   BILIRUBINUR NEGATIVE 02/17/2013 1524   KETONESUR >80* 02/17/2013 1524   PROTEINUR NEGATIVE 02/17/2013 1524   UROBILINOGEN 0.2 02/17/2013 1524   NITRITE NEGATIVE 02/17/2013 1524   LEUKOCYTESUR NEGATIVE 02/17/2013 1524    ----------------------------------------------------------------------------------------------------------------  Imaging results:   Dg Chest 2 View  02/17/2013   CLINICAL DATA:  Fever.  Cough.  EXAM: CHEST  2 VIEW  COMPARISON:  12/20/2012, 08/03/2011, 06/01/2011, 08/21/2010.  FINDINGS: Suboptimal inspiration accounts for crowded bronchovascular markings, especially in the bases, and accentuates the cardiac silhouette. Taking this into account, cardiomediastinal silhouette unremarkable and unchanged. Patchy airspace opacities in the lower lobes bilaterally and in the right middle lobe. Upper lobes clear. No visible pleural effusions. Degenerative changes involving the thoracic spine.  IMPRESSION: Suboptimal inspiration.  Patchy pneumonia involving the lower lobes bilaterally and in the right middle lobe.   Electronically Signed   By: Hulan Saas M.D.   On: 02/17/2013 15:17         Assessment & Plan   1. Community acquired pneumonia suspicious at least initially for influenza with likely superimposed bacterial infection - we'll admit to MedSurg bed, sputum, blood cultures, influenza panel. Place her on Tamiflu along with Rocephin and azithromycin, oxygen and nebulizer treatments as needed. Gentle IV fluids and Supportive care. Currently does not appear to be toxic. Not requiring any oxygen not hypotensive.    2.  History of rheumatoid arthritis. For now methotrexate and Embrel will BE held due to #1 above, she was taking 5 mg of prednisone on a daily basis I will increase it to 20 mg for now. If blood pressure drops or she appears to be toxic she could be switched to stress dose steroids. Continue pain control for chronic joint pains and aches.     3. GERD. Continue PPI.     4. Dyslipidemia continue home dose statin.     5. Hypertension. Blood pressure stable but not high, once she is hydrated her home medications which are ACE inhibitor and a diuretic can be resumed.     6. Mild nausea. Likely due to #1 above, Zofran and PPI, abdominal exam unremarkable. Monitor.     7. Hypokalemia - replace in IV fluids and monitor.     8. Possible UTI. Specimen is not of good quality. Will repeat UA.        DVT Prophylaxis Heparin    AM Labs Ordered, also please review Full Orders  Family Communication: Admission, patients condition and plan of care including tests being ordered have been discussed with the patient and husband who indicate understanding and agree with the plan and Code Status.  Code Status full  Likely DC to  home  Condition GUARDED   Time spent in minutes : 35    Susa Raring K M.D on 02/17/2013 at 5:23 PM  Between 7am to 7pm - Pager -  [phone]  After 7pm go to [email or web address] - password [email or web address]  And look for the night coverage person covering me after hours  [facility] Office  [phone]

## 2013-02-17 NOTE — ED Provider Notes (Signed)
CSN: [id number]     Arrival date & time 02/17/13  1300 History  This chart was scribed for Raeford Razor, MD by Smiley Houseman, ED Scribe. The patient was seen in room APA04/APA04. Patient's care was started at 2:34 PM.    Chief Complaint  Patient presents with  . Wheezing  . Cough   The history is provided by the patient and the spouse. No language interpreter was used.   HPI Comments: [name] is a 57 y.o. female who presents to the Emergency Department complaining of persistent worsening cough that started about five days ago.  She also has associated chills, sore throat, cough, nausea, right ear pain, wheezing, SOB, generalized body aches and fever.  She states her symptoms were improving until this morning when they began worsening again.  She states fever was 1042F, ED temperature is 98.42F.  She also states she has slight dysuria.  Her husband states she does have sick contacts at their church.     Past Medical History  Diagnosis Date  . ANGIOMA 10/03/2008    REMOVED FROM RIGHT LOWER LEG-BENIGN  . HYPERLIPIDEMIA 08/27/2008  . DEPRESSION 12/10/2009  . HYPERTENSION 08/27/2008  . GERD 08/27/2008  . SEBORRHEIC KERATOSIS 08/27/2008  . Arthritis     RA  . Bronchitis     SEASONAL  . Hyperlipemia   . Fibromyalgia    Past Surgical History  Procedure Laterality Date  . Cesarean section  1980  . Cesarean section  1982  . Cholecystectomy  1997  . Abdominal hysterectomy  1995  . Foot surgery  2004, 2005, 2008  . Knee arthroscopy  2007, 2208, 2009  . Nasal sinus surgery  2013  . Joint replacement  10,12    lt total knee  . Breast ductal system excision  08/16/2011    Procedure: EXCISION DUCTAL SYSTEM BREAST;  Surgeon: Clovis Pu. Cornett, MD;  Location: [facility];  Service: General;  Laterality: Left;  left breast duct excision  . Open surgical repair of gluteal tendon  01/17/2012    Procedure: OPEN SURGICAL REPAIR OF GLUTEAL TENDON;  Surgeon: Shelda Pal, MD;   Location: [facility];  Service: Orthopedics;  Laterality: Right;   Family History  Problem Relation Age of Onset  . Cancer Mother     breast  . Cancer Father     prostate  . Diabetes Other     grandparent  . Heart disease Other     parent  . Kidney disease Other     parent  . Cancer Other     parent  . Hypertension Other   . Hyperlipidemia Other    History  Substance Use Topics  . Smoking status: Former Smoker -- 0.50 packs/day for 5 years    Types: Cigarettes    Quit date: 02/22/1990  . Smokeless tobacco: Never Used  . Alcohol Use: No   OB History   Grav Para Term Preterm Abortions TAB SAB Ect Mult Living                 Review of Systems  All other systems reviewed and are negative.    Allergies  Codeine sulfate and Penicillins  Home Medications   Current Outpatient Rx  Name  Route  Sig  Dispense  Refill  . acetaminophen (TYLENOL) 500 MG tablet   Oral   Take 1,000 mg by mouth every 6 (six) hours as needed for pain.          Marland Kitchen  aspirin EC 81 MG tablet   Oral   Take 81 mg by mouth daily.         Marland Kitchen estradiol (ESTRACE) 1 MG tablet   Oral   Take 1 mg by mouth daily.         Marland Kitchen etanercept (ENBREL) 50 MG/ML injection   Subcutaneous   Inject 50 mg into the skin once a week.         . fluticasone (FLONASE) 50 MCG/ACT nasal spray   Nasal   Place 2 sprays into the nose daily as needed for allergies.          . folic acid (FOLVITE) 1 MG tablet   Oral   Take 2 mg by mouth daily.          . hydrochlorothiazide (HYDRODIURIL) 25 MG tablet   Oral   Take 25 mg by mouth daily.         Marland Kitchen lisinopril (PRINIVIL,ZESTRIL) 20 MG tablet   Oral   Take 1 tablet (20 mg total) by mouth daily.   90 tablet   3   . Methotrexate, Anti-Rheumatic, (METHOTREXATE, PF, [employer])   Subcutaneous   Inject 1 mg into the skin once a week.          Marland Kitchen omeprazole (PRILOSEC) 20 MG capsule   Oral   Take 20 mg by mouth 2 (two) times daily.         Marland Kitchen oxyCODONE-acetaminophen  (PERCOCET/ROXICET) 5-325 MG per tablet   Oral   Take 1 tablet by mouth every 4 (four) hours as needed for pain.         . pramipexole (MIRAPEX) 0.125 MG tablet   Oral   Take 3 tablets (0.375 mg total) by mouth at bedtime.   360 tablet   3   . predniSONE (DELTASONE) 5 MG tablet   Oral   Take 5 mg by mouth daily with breakfast.         . sertraline (ZOLOFT) 50 MG tablet   Oral   Take 50 mg by mouth daily.         . simvastatin (ZOCOR) 20 MG tablet   Oral   Take 20 mg by mouth every evening.         . zolpidem (AMBIEN) 10 MG tablet   Oral   Take 1 tablet (10 mg total) by mouth at bedtime as needed for sleep.   90 tablet   1    Triage Vitals: BP 129/51  Pulse 76  Temp(Src) 98.2 F (36.8 C) (Oral)  Resp 24  Wt 275 lb (124.739 kg)  SpO2 100% Physical Exam  Nursing note and vitals reviewed. Constitutional: She is oriented to person, place, and time. She appears well-developed and well-nourished. No distress.  HENT:  Head: Normocephalic and atraumatic.  Right Ear: Tympanic membrane, external ear and ear canal normal.  Left Ear: Tympanic membrane, external ear and ear canal normal.  Mouth/Throat: No oropharyngeal exudate, posterior oropharyngeal edema or posterior oropharyngeal erythema.  Eyes: Conjunctivae are normal. Right eye exhibits no discharge. Left eye exhibits no discharge.  Right tympanostomy tube.   Ears otherwsie normal  Neck: Neck supple.  Cardiovascular: Normal rate, regular rhythm and normal heart sounds.  Exam reveals no gallop and no friction rub.   No murmur heard. Pulmonary/Chest: Effort normal. No respiratory distress. She has wheezes.  Expiratory wheezing bilaterally Mild tachypnea  Abdominal: Soft. She exhibits no distension. There is tenderness. There is no rebound and no guarding.  Mild lower abdominal tenderness  Musculoskeletal: She exhibits no edema and no tenderness.  Neurological: She is alert and oriented to person, place, and time.   Skin: Skin is warm and dry.  Psychiatric: She has a normal mood and affect. Her behavior is normal. Thought content normal.    ED Course  Procedures (including critical care time) DIAGNOSTIC STUDIES: Oxygen Saturation is 100% on RA, normal by my interpretation.    COORDINATION OF CARE: 2:37 PM-Patient informed of current plan of treatment and evaluation and agrees with plan.    Labs Review Labs Reviewed  URINALYSIS, ROUTINE W REFLEX MICROSCOPIC - Abnormal; Notable for the following:    APPearance HAZY (*)    Hgb urine dipstick TRACE (*)    Ketones, ur >80 (*)    All other components within normal limits  URINE MICROSCOPIC-ADD ON - Abnormal; Notable for the following:    Squamous Epithelial / LPF MANY (*)    Bacteria, UA FEW (*)    All other components within normal limits  CBC WITH DIFFERENTIAL - Abnormal; Notable for the following:    RBC 3.59 (*)    Hemoglobin 9.5 (*)    HCT 30.5 (*)    RDW 16.5 (*)    Neutrophils Relative % 81 (*)    Neutro Abs 7.9 (*)    All other components within normal limits  BASIC METABOLIC PANEL - Abnormal; Notable for the following:    Potassium 3.1 (*)    Glucose, Bld 140 (*)    Calcium 8.3 (*)    All other components within normal limits  BASIC METABOLIC PANEL - Abnormal; Notable for the following:    Potassium 3.3 (*)    Calcium 8.0 (*)    All other components within normal limits  CBC - Abnormal; Notable for the following:    RBC 3.31 (*)    Hemoglobin 9.0 (*)    HCT 28.4 (*)    RDW 17.0 (*)    All other components within normal limits  CULTURE, BLOOD (ROUTINE X 2)  CULTURE, BLOOD (ROUTINE X 2)  CULTURE, RESPIRATORY (NON-EXPECTORATED)  CULTURE, EXPECTORATED SPUTUM-ASSESSMENT  GRAM STAIN  HIV ANTIBODY (ROUTINE TESTING)  INFLUENZA PANEL BY PCR  GLUCOSE, CAPILLARY   Imaging Review No results found.  Dg Chest 2 View  02/17/2013   CLINICAL DATA:  Fever.  Cough.  EXAM: CHEST  2 VIEW  COMPARISON:  12/20/2012, 08/03/2011,  06/01/2011, 08/21/2010.  FINDINGS: Suboptimal inspiration accounts for crowded bronchovascular markings, especially in the bases, and accentuates the cardiac silhouette. Taking this into account, cardiomediastinal silhouette unremarkable and unchanged. Patchy airspace opacities in the lower lobes bilaterally and in the right middle lobe. Upper lobes clear. No visible pleural effusions. Degenerative changes involving the thoracic spine.  IMPRESSION: Suboptimal inspiration. Patchy pneumonia involving the lower lobes bilaterally and in the right middle lobe.   Electronically Signed   By: Hulan Saas M.D.   On: 02/17/2013 15:17      MDM   1. CAP (community acquired pneumonia)   2. Chronic insomnia   3. Depressive disorder, not elsewhere classified   4. Esophageal reflux   5. Unspecified essential hypertension    I personally reviewed the pt's radiographic studies.  I personally preformed the services scribed in my presence. The recorded information has been reviewed is accurate. Raeford Razor, MD.    Raeford Razor, MD 02/22/13 1346

## 2013-02-17 NOTE — ED Notes (Signed)
Coughing and wheezing since the 23rd.   Nausea,  Fever and achy all over

## 2013-02-18 DIAGNOSIS — J189 Pneumonia, unspecified organism: Secondary | ICD-10-CM | POA: Diagnosis not present

## 2013-02-18 LAB — BASIC METABOLIC PANEL
BUN: 9 mg/dL (ref 6–23)
CO2: 24 mEq/L (ref 19–32)
Calcium: 8 mg/dL — ABNORMAL LOW (ref 8.4–10.5)
Chloride: 107 mEq/L (ref 96–112)
Creatinine, Ser: 0.63 mg/dL (ref 0.50–1.10)
GFR calc Af Amer: >90 mL/min (ref >90)
GFR calc non Af Amer: >90 mL/min (ref >90)
Glucose, Bld: 93 mg/dL (ref 70–99)
Potassium: 3.3 mEq/L — ABNORMAL LOW (ref 3.5–5.1)
Sodium: 140 mEq/L (ref 135–145)

## 2013-02-18 LAB — CBC
HCT: 28.4 % — ABNORMAL LOW (ref 36.0–46.0)
Hemoglobin: 9 g/dL — ABNORMAL LOW (ref 12.0–15.0)
MCH: 27.2 pg (ref 26.0–34.0)
MCHC: 31.7 g/dL (ref 30.0–36.0)
MCV: 85.8 fL (ref 78.0–100.0)
Platelets: 279 10*3/uL (ref 150–400)
RBC: 3.31 MIL/uL — ABNORMAL LOW (ref 3.87–5.11)
RDW: 17 % — ABNORMAL HIGH (ref 11.5–15.5)
WBC: 4.7 10*3/uL (ref 4.0–10.5)

## 2013-02-18 LAB — HIV ANTIBODY (ROUTINE TESTING W REFLEX): HIV: NONREACTIVE

## 2013-02-18 LAB — GLUCOSE, CAPILLARY: Glucose-Capillary: 93 mg/dL (ref 70–99)

## 2013-02-18 MED ORDER — FLUTICASONE PROPIONATE 50 MCG/ACT NA SUSP
NASAL | Status: AC
  Filled 2013-02-18: qty 16

## 2013-02-18 MED ORDER — FLUTICASONE PROPIONATE 50 MCG/ACT NA SUSP
1.0000 | Freq: Two times a day (BID) | NASAL | Status: DC
  Administered 2013-02-18 – 2013-02-19 (×2): 1 via NASAL
  Filled 2013-02-18: qty 16

## 2013-02-18 MED ORDER — PRAMIPEXOLE DIHYDROCHLORIDE 0.25 MG PO TABS
0.3750 mg | ORAL_TABLET | Freq: Every day | ORAL | Status: DC
  Administered 2013-02-18: 0.375 mg via ORAL
  Filled 2013-02-18 (×3): qty 1.5

## 2013-02-18 MED ORDER — LEVOFLOXACIN 500 MG PO TABS
500.0000 mg | ORAL_TABLET | Freq: Every day | ORAL | Status: DC
  Administered 2013-02-18 – 2013-02-19 (×2): 500 mg via ORAL
  Filled 2013-02-18 (×2): qty 1

## 2013-02-18 NOTE — Progress Notes (Signed)
ANTIBIOTIC CONSULT NOTE - INITIAL  Pharmacy Consult for Renal adjustment antibiotics Indication: pneumonia  Allergies  Allergen Reactions  . Codeine Sulfate Other (See Comments)    GI upset  . Penicillins Rash    Patient Measurements: Height: 5\' 4"  (162.6 cm) Weight: 277 lb (125.646 kg) IBW/kg (Calculated) : 54.7 Adjusted Body Weight:   Vital Signs: Temp: 97.3 F (36.3 C) (12/28 0552) Temp src: Oral (12/28 0552) BP: 115/72 mmHg (12/28 0552) Pulse Rate: 64 (12/28 0552) Intake/Output from previous day: 12/27 0701 - 12/28 0700 In: 1003.8 [I.V.:753.8; IV Piggyback:250] Out: 200 [Urine:200] Intake/Output from this shift:    Labs:  Recent Labs  02/17/13 1635 02/18/13 0609  WBC 9.8 4.7  HGB 9.5* 9.0*  PLT 276 279  CREATININE 0.57 0.63   Estimated Creatinine Clearance: 101.8 ml/min (by C-G formula based on Cr of 0.63). No results found for this basename: VANCOTROUGH, VANCOPEAK, VANCORANDOM, GENTTROUGH, GENTPEAK, GENTRANDOM, TOBRATROUGH, TOBRAPEAK, TOBRARND, AMIKACINPEAK, AMIKACINTROU, AMIKACIN,  in the last 72 hours   Microbiology: Recent Results (from the past 720 hour(s))  CULTURE, BLOOD (ROUTINE X 2)     Status: None   Collection Time    02/17/13  6:01 PM      Result Value Range Status   Specimen Description LEFT ANTECUBITAL   Final   Special Requests BOTTLES DRAWN AEROBIC AND ANAEROBIC 12CC EACH   Final   Culture NO GROWTH 1 DAY   Final   Report Status PENDING   Incomplete  CULTURE, BLOOD (ROUTINE X 2)     Status: None   Collection Time    02/17/13  6:06 PM      Result Value Range Status   Specimen Description BLOOD LEFT HAND   Final   Special Requests BOTTLES DRAWN AEROBIC AND ANAEROBIC 10CC EACH   Final   Culture NO GROWTH 1 DAY   Final   Report Status PENDING   Incomplete    Medical History: Past Medical History  Diagnosis Date  . ANGIOMA 10/03/2008    REMOVED FROM RIGHT LOWER LEG-BENIGN  . HYPERLIPIDEMIA 08/27/2008  . DEPRESSION 12/10/2009  .  HYPERTENSION 08/27/2008  . GERD 08/27/2008  . SEBORRHEIC KERATOSIS 08/27/2008  . Arthritis     RA  . Bronchitis     SEASONAL  . Hyperlipemia   . Fibromyalgia     Medications:  Scheduled:  . aspirin EC  81 mg Oral Daily  . azithromycin  500 mg Intravenous Q24H  . cefTRIAXone (ROCEPHIN)  IV  1 g Intravenous Q24H  . estradiol  1 mg Oral Daily  . folic acid  2 mg Oral Daily  . heparin  5,000 Units Subcutaneous Q8H  . ondansetron  4 mg Intramuscular Once  . oseltamivir  75 mg Oral BID  . pantoprazole  40 mg Oral Daily  . pramipexole  0.375 mg Oral QHS  . predniSONE  20 mg Oral Q breakfast  . sertraline  50 mg Oral Daily  . simvastatin  20 mg Oral QPM   Assessment: Admitted community acquired pneumonia Ceftriaxone 1 GM IV every 24 hours started Azithromycin 500 mg IV every 24 hours started  Goal of Therapy:  Eradicate infection  Plan:  Continue Azithromycin 500 mg IV every 24 hours Continue Ceftriaxone 1 GM IV every 24 hours No renal adjustment necessary F/U when/if antibiotic changes  [name], [name] 02/18/2013,11:03 AM

## 2013-02-18 NOTE — Progress Notes (Signed)
[name] ZOX:[id number] DOB: [birth year] DOA: 02/17/2013 PCP: Kristian Covey, MD  Brief narrative:  57 y/o ?, known rheumatoid arthritis for the past 20 years currently on MTX, Enbrel:  Hypertension, depression,  Angioma, fibromyalgia, admitted  12/27 with pneumonia  Past medical history-As per Problem list Chart reviewed as below- reviewed  Consultants:  none  Procedures:   CXr   Antibiotics:    Azithromycin 12/27- stop  Ceftriaxone 12/27- stop  Levaquin 12/27 >>>  Tamiflu 12/27- stop   Subjective   feels 50% of her regular self Has some congestionand headache No chest pain no shortness of breath not choosing much sputum    Objective    Interim History: None  Telemetry:  sinus rhythm   Objective: Filed Vitals:   02/17/13 2302 02/18/13 0552 02/18/13 1145 02/18/13 1520  BP:  115/72  126/66  Pulse:  64 66 70  Temp:  97.3 F (36.3 C)  97.9 F (36.6 C)  TempSrc:  Oral  Oral  Resp:  18 18 18   Height:  5\' 4"  (1.626 m)    Weight:  125.646 kg (277 lb)    SpO2: 98% 100% 99% 98%    Intake/Output Summary (Last 24 hours) at 02/18/13 1700 Last data filed at 02/18/13 1519  Gross per 24 hour  Intake 1233.75 ml  Output    350 ml  Net 883.75 ml    Exam:  General:  EOMI, Body mass index is 47.52 kg/(m^2). Cardiovascular: S1-S2 no murmur rub or gallop Respiratory:  Clinically clear no fremitus or resonance but decreased air entry bilaterally posteriorly Abdomen:  obese Skin and 80 Neuro grossly intact  Data Reviewed: Basic Metabolic Panel:  Recent Labs Lab 02/17/13 1635 02/18/13 0609  NA 138 140  K 3.1* 3.3*  CL 102 107  CO2 22 24  GLUCOSE 140* 93  BUN 11 9  CREATININE 0.57 0.63  CALCIUM 8.3* 8.0*   Liver Function Tests: No results found for this basename: AST, ALT, ALKPHOS, BILITOT, PROT, ALBUMIN,  in the last 168 hours No results found for this basename: LIPASE, AMYLASE,  in the last 168 hours No results found for this  basename: AMMONIA,  in the last 168 hours CBC:  Recent Labs Lab 02/17/13 1635 02/18/13 0609  WBC 9.8 4.7  NEUTROABS 7.9*  --   HGB 9.5* 9.0*  HCT 30.5* 28.4*  MCV 85.0 85.8  PLT 276 279   Cardiac Enzymes: No results found for this basename: CKTOTAL, CKMB, CKMBINDEX, TROPONINI,  in the last 168 hours BNP: No components found with this basename: POCBNP,  CBG:  Recent Labs Lab 02/18/13 0722  GLUCAP 93    Recent Results (from the past 240 hour(s))  CULTURE, BLOOD (ROUTINE X 2)     Status: None   Collection Time    02/17/13  6:01 PM      Result Value Range Status   Specimen Description LEFT ANTECUBITAL   Final   Special Requests BOTTLES DRAWN AEROBIC AND ANAEROBIC 12CC EACH   Final   Culture NO GROWTH 1 DAY   Final   Report Status PENDING   Incomplete  CULTURE, BLOOD (ROUTINE X 2)     Status: None   Collection Time    02/17/13  6:06 PM      Result Value Range Status   Specimen Description BLOOD LEFT HAND   Final   Special Requests BOTTLES DRAWN AEROBIC AND ANAEROBIC 10CC EACH   Final   Culture NO GROWTH 1 DAY  Final   Report Status PENDING   Incomplete     Studies:              All Imaging reviewed and is as per above notation   Scheduled Meds: . aspirin EC  81 mg Oral Daily  . estradiol  1 mg Oral Daily  . folic acid  2 mg Oral Daily  . heparin  5,000 Units Subcutaneous Q8H  . levofloxacin  500 mg Oral Daily  . ondansetron  4 mg Intramuscular Once  . pantoprazole  40 mg Oral Daily  . pramipexole  0.375 mg Oral QHS  . predniSONE  20 mg Oral Q breakfast  . sertraline  50 mg Oral Daily  . simvastatin  20 mg Oral QPM   Continuous Infusions: . 0.9 % NaCl with KCl 40 mEq / L 75 mL/hr at 02/18/13 1122     Assessment/Plan: 1.  likely community-acquired pneumonia-narrow antibiotics from  CAP coverage to Levaquin monotherapy, duration 5-7 days.   Looking better today and hopefully can aim for discharge in one to 2 days 2.   Rheumatoid arthritis-current  biological agents on hold. Continue steroids 3.  Hypertension- blood pressure medications on hold for now  Code Status:  full Family Communication:  None at bedside Disposition Plan:  And patient   Pleas Koch, MD  [facility] Pager [phone] 02/18/2013, 5:00 PM    LOS: 1 day

## 2013-02-18 NOTE — Progress Notes (Signed)
Utilization review Completed Artia Singley RN BSN   

## 2013-02-19 DIAGNOSIS — J189 Pneumonia, unspecified organism: Secondary | ICD-10-CM | POA: Diagnosis not present

## 2013-02-19 MED ORDER — PREDNISONE 20 MG PO TABS: 20.0000 mg | ORAL_TABLET | Freq: Every day | ORAL | Status: DC

## 2013-02-19 MED ORDER — LEVOFLOXACIN 500 MG PO TABS: 500.0000 mg | ORAL_TABLET | Freq: Every day | ORAL | Status: DC

## 2013-02-19 NOTE — Progress Notes (Signed)
IV removed. Discharge instructions reviewed with patient. Understanding verbalized. Ready for discharge home 

## 2013-02-19 NOTE — Discharge Summary (Signed)
Physician Discharge Summary  [name] GNF:[id number] DOB: [birth year] DOA: 02/17/2013  PCP: Kristian Covey, MD  Admit date: 02/17/2013 Discharge date: 02/19/2013  Time spent: 35 minutes  Recommendations for Outpatient Follow-up:  1. Please follow respiratory cultures 2. Recommend basic metabolic panel in one to 2 days 3. Consider colonoscopy as an outpatient if not done 4. Consider discontinuation of  diuretic-blood pressure control was good off of antihypertensives in hospital 5. Patient has been instructed to not take her methotrexate or Enbrel until she sees her rheumatologist 6. Patient will get a steroid burst of 20 mg prednisone ending on 02/22/2013 7. She's to complete Levaquin dosage 8. Would recommend discussion with her regarding her fibromyalgia and other pain issues as an outpatient  Discharge Diagnoses:  Principal Problem:   CAP (community acquired pneumonia) Active Problems:   HYPERLIPIDEMIA   DEPRESSION   HYPERTENSION   GERD   Lumbago   Chronic insomnia   Morbid obesity with BMI of 45.0-49.9, adult   Discharge Condition: Good  Diet recommendation: Heart healthy  Filed Weights   02/17/13 1326 02/18/13 0552 02/19/13 0421  Weight: 124.739 kg (275 lb) 125.646 kg (277 lb) 127.914 kg (282 lb)    History of present illness:  57 y.o. female, history of rheumatoid arthritis who is on 5 mg of prednisone along with methotrexate and Embrel sees Dr. Dierdre Forth for rheumatological problems, morbid obesity, hypertension, GERD, dyslipidemia comes to the hospital with 4-5 day history of dry cough, fever chills, body aches and mild generalized headaches, symptoms were not getting better so she presented to the ER where she was found to be febrile with leukocytosis, chest x-ray was suggestive of bibasilar infiltrate and UA was 30 She was treated along the community-acquired pneumonia pathway and initially was started on Tamiflu, azithromycin, ceftriaxone which were  subsequently quickly narrowed to Levaquin. She experienced no fevers or chills  Blood cultures did not show any growth Her respiratory cultures were pending on day of discharge home She felt close to her normal self on day 3 of hospital stay and as she was on completely oral therapy ambulatory and had no oxygen requirement, patient was deemed as fit to be discharged and having met maximum medical benefit from hospital inpatient stay  Discharge Exam: Filed Vitals:   02/19/13 0421  BP: 120/80  Pulse: 60  Temp: 97.7 F (36.5 C)  Resp: 18   Alert pleasant oriented feels about 70% of her normal self and wants to go home tolerating diet No pains or joint aches No chest pain No shortness of breath Sputum has cleared up  General: EOMI, NCAT Cardiovascular: S1-S2 no murmur or gallop Respiratory: Clinically clear no added sound no egophony, no fremitus  Discharge Instructions  Discharge Orders   Future Orders Complete By Expires   Diet - low sodium heart healthy  As directed    Discharge instructions  As directed    Comments:     Do not take methotrexate other rheumatoid medications until you speak to Dr. Dierdre Forth Continue prednisone at 20 mg for 5 days Complete course of antibiotics If you feel ill or sick, likely primary care physician know  You were cared for by a hospitalist during your hospital stay. If you have any questions about your discharge medications or the care you received while you were in the hospital after you are discharged, you can call the unit and asked to speak with the hospitalist on call if the hospitalist that took care of you is not  available. Once you are discharged, your primary care physician will handle any further medical issues. Please note that NO REFILLS for any discharge medications will be authorized once you are discharged, as it is imperative that you return to your primary care physician (or establish a relationship with a primary care physician if you  do not have one) for your aftercare needs so that they can reassess your need for medications and monitor your lab values. If you do not have a primary care physician, you can call [phone] for a physician referral.   Increase activity slowly  As directed        Medication List    STOP taking these medications       ENBREL 50 MG/ML injection  Generic drug:  etanercept     METHOTREXATE (PF) [place]      TAKE these medications       acetaminophen 500 MG tablet  Commonly known as:  TYLENOL  Take 1,000 mg by mouth every 6 (six) hours as needed for pain.     aspirin EC 81 MG tablet  Take 81 mg by mouth daily.     estradiol 1 MG tablet  Commonly known as:  ESTRACE  Take 1 mg by mouth daily.     fluticasone 50 MCG/ACT nasal spray  Commonly known as:  FLONASE  Place 2 sprays into the nose daily as needed for allergies.     folic acid 1 MG tablet  Commonly known as:  FOLVITE  Take 2 mg by mouth daily.     hydrochlorothiazide 25 MG tablet  Commonly known as:  HYDRODIURIL  Take 25 mg by mouth daily.     levofloxacin 500 MG tablet  Commonly known as:  LEVAQUIN  Take 1 tablet (500 mg total) by mouth daily.     lisinopril 20 MG tablet  Commonly known as:  PRINIVIL,ZESTRIL  Take 1 tablet (20 mg total) by mouth daily.     omeprazole 20 MG capsule  Commonly known as:  PRILOSEC  Take 20 mg by mouth 2 (two) times daily.     oxyCODONE-acetaminophen 5-325 MG per tablet  Commonly known as:  PERCOCET/ROXICET  Take 1 tablet by mouth every 4 (four) hours as needed for pain.     pramipexole 0.125 MG tablet  Commonly known as:  MIRAPEX  Take 3 tablets (0.375 mg total) by mouth at bedtime.     predniSONE 20 MG tablet  Commonly known as:  DELTASONE  Take 1 tablet (20 mg total) by mouth daily with breakfast.     sertraline 50 MG tablet  Commonly known as:  ZOLOFT  Take 50 mg by mouth daily.     simvastatin 20 MG tablet  Commonly known as:  ZOCOR  Take 20 mg by mouth every evening.      zolpidem 10 MG tablet  Commonly known as:  AMBIEN  Take 1 tablet (10 mg total) by mouth at bedtime as needed for sleep.       Allergies  Allergen Reactions  . Codeine Sulfate Other (See Comments)    GI upset  . Penicillins Rash      The results of significant diagnostics from this hospitalization (including imaging, microbiology, ancillary and laboratory) are listed below for reference.    Significant Diagnostic Studies: Dg Chest 2 View  02/17/2013   CLINICAL DATA:  Fever.  Cough.  EXAM: CHEST  2 VIEW  COMPARISON:  12/20/2012, 08/03/2011, 06/01/2011, 08/21/2010.  FINDINGS: Suboptimal inspiration accounts for crowded  bronchovascular markings, especially in the bases, and accentuates the cardiac silhouette. Taking this into account, cardiomediastinal silhouette unremarkable and unchanged. Patchy airspace opacities in the lower lobes bilaterally and in the right middle lobe. Upper lobes clear. No visible pleural effusions. Degenerative changes involving the thoracic spine.  IMPRESSION: Suboptimal inspiration. Patchy pneumonia involving the lower lobes bilaterally and in the right middle lobe.   Electronically Signed   By: Hulan Saas M.D.   On: 02/17/2013 15:17    Microbiology: Recent Results (from the past 240 hour(s))  CULTURE, BLOOD (ROUTINE X 2)     Status: None   Collection Time    02/17/13  6:01 PM      Result Value Range Status   Specimen Description LEFT ANTECUBITAL   Final   Special Requests BOTTLES DRAWN AEROBIC AND ANAEROBIC 12CC EACH   Final   Culture NO GROWTH 1 DAY   Final   Report Status PENDING   Incomplete  CULTURE, BLOOD (ROUTINE X 2)     Status: None   Collection Time    02/17/13  6:06 PM      Result Value Range Status   Specimen Description BLOOD LEFT HAND   Final   Special Requests BOTTLES DRAWN AEROBIC AND ANAEROBIC 10CC EACH   Final   Culture NO GROWTH 1 DAY   Final   Report Status PENDING   Incomplete     Labs: Basic Metabolic  Panel:  Recent Labs Lab 02/17/13 1635 02/18/13 0609  NA 138 140  K 3.1* 3.3*  CL 102 107  CO2 22 24  GLUCOSE 140* 93  BUN 11 9  CREATININE 0.57 0.63  CALCIUM 8.3* 8.0*   Liver Function Tests: No results found for this basename: AST, ALT, ALKPHOS, BILITOT, PROT, ALBUMIN,  in the last 168 hours No results found for this basename: LIPASE, AMYLASE,  in the last 168 hours No results found for this basename: AMMONIA,  in the last 168 hours CBC:  Recent Labs Lab 02/17/13 1635 02/18/13 0609  WBC 9.8 4.7  NEUTROABS 7.9*  --   HGB 9.5* 9.0*  HCT 30.5* 28.4*  MCV 85.0 85.8  PLT 276 279   Cardiac Enzymes: No results found for this basename: CKTOTAL, CKMB, CKMBINDEX, TROPONINI,  in the last 168 hours BNP: BNP (last 3 results) No results found for this basename: PROBNP,  in the last 8760 hours CBG:  Recent Labs Lab 02/18/13 0722  GLUCAP 93       Signed:  Rhetta Mura  [facility] 02/19/2013, 9:42 AM

## 2013-02-20 LAB — CULTURE, RESPIRATORY W GRAM STAIN: Culture: NORMAL

## 2013-02-22 LAB — CULTURE, BLOOD (ROUTINE X 2)
Culture: NO GROWTH
Culture: NO GROWTH

## 2013-02-28 ENCOUNTER — Encounter: Payer: Self-pay | Admitting: Family Medicine

## 2013-02-28 ENCOUNTER — Ambulatory Visit (INDEPENDENT_AMBULATORY_CARE_PROVIDER_SITE_OTHER): Payer: BC Managed Care – PPO | Admitting: Family Medicine

## 2013-02-28 VITALS — BP 126/70 | HR 78 | Temp 98.3°F | Wt 274.0 lb

## 2013-02-28 DIAGNOSIS — R197 Diarrhea, unspecified: Secondary | ICD-10-CM

## 2013-02-28 DIAGNOSIS — D649 Anemia, unspecified: Secondary | ICD-10-CM

## 2013-02-28 DIAGNOSIS — J189 Pneumonia, unspecified organism: Secondary | ICD-10-CM

## 2013-02-28 DIAGNOSIS — E876 Hypokalemia: Secondary | ICD-10-CM

## 2013-02-28 LAB — FERRITIN: Ferritin: 8.7 ng/mL — ABNORMAL LOW (ref 10.0–291.0)

## 2013-02-28 LAB — VITAMIN B12: Vitamin B-12: 310 pg/mL (ref 211–911)

## 2013-02-28 LAB — BASIC METABOLIC PANEL
BUN: 13 mg/dL (ref 6–23)
CO2: 25 mEq/L (ref 19–32)
Calcium: 9.1 mg/dL (ref 8.4–10.5)
Chloride: 105 mEq/L (ref 96–112)
Creatinine, Ser: 0.6 mg/dL (ref 0.4–1.2)
GFR: 101.64 mL/min (ref >60.00)
Glucose, Bld: 93 mg/dL (ref 70–99)
Potassium: 4.2 mEq/L (ref 3.5–5.1)
Sodium: 138 mEq/L (ref 135–145)

## 2013-02-28 MED ORDER — METRONIDAZOLE 500 MG PO TABS: 500.0000 mg | ORAL_TABLET | Freq: Three times a day (TID) | ORAL | Status: DC

## 2013-02-28 NOTE — Progress Notes (Signed)
Pre visit review using our clinic review tool, if applicable. No additional management support is needed unless otherwise documented below in the visit note. 

## 2013-02-28 NOTE — Progress Notes (Signed)
Subjective:    Patient ID: [name], female    DOB: Jul 09, 1955, 58 y.o.   MRN: [id number]  HPI Followup from recent hospitalization Patient had developed acute upper respiratory infection in December. She developed fever up to about 102 and presented to emergency department on 02/17/2013. Chest x-ray revealed bibasilar pneumonia she was diagnosed with community acquired pneumonia. She was treated with Zithromax and ceftriaxone and then switched to Levaquin. She had initial leukocytosis. She quickly defervesced. Blood cultures were negative. Respiratory cultures revealed normal oral flora. She had mild hypokalemia on admission and her diuretic with HCTZ was held. Blood pressures remained stable.  She is followed by rheumatology and has been maintained on immunosuppressants with Enbrel and methotrexate and these were held on admission.  Since discharge, she's had no fever. Respiratory status stable. She has had though severe diarrhea several times per day but no bloody stools. No fever. Somewhat poor appetite. She's had some diffuse lower abdominal cramps.  Patient's chronic normocytic anemia and recent hemoglobin 9.0 which was low for her. She does take chronic PPI. She had colonoscopy 2007. Strong family history colon cancer in mother and sister. She has been scheduled for repeat colonoscopy in a couple of weeks   Past Medical History  Diagnosis Date  . ANGIOMA 10/03/2008    REMOVED FROM RIGHT LOWER LEG-BENIGN  . HYPERLIPIDEMIA 08/27/2008  . DEPRESSION 12/10/2009  . HYPERTENSION 08/27/2008  . GERD 08/27/2008  . SEBORRHEIC KERATOSIS 08/27/2008  . Arthritis     RA  . Bronchitis     SEASONAL  . Hyperlipemia   . Fibromyalgia    Past Surgical History  Procedure Laterality Date  . Cesarean section  1980  . Cesarean section  1982  . Cholecystectomy  1997  . Abdominal hysterectomy  1995  . Foot surgery  2004, 2005, 2008  . Knee arthroscopy  2007, 2208, 2009  . Nasal sinus surgery   2013  . Joint replacement  10,12    lt total knee  . Breast ductal system excision  08/16/2011    Procedure: EXCISION DUCTAL SYSTEM BREAST;  Surgeon: Joyice Faster. Cornett, MD;  Location: [facility];  Service: General;  Laterality: Left;  left breast duct excision  . Open surgical repair of gluteal tendon  01/17/2012    Procedure: OPEN SURGICAL REPAIR OF GLUTEAL TENDON;  Surgeon: Mauri Pole, MD;  Location: [facility];  Service: Orthopedics;  Laterality: Right;    reports that she quit smoking about 23 years ago. Her smoking use included Cigarettes. She has a 2.5 pack-year smoking history. She has never used smokeless tobacco. She reports that she does not drink alcohol or use illicit drugs. family history includes Cancer in her father, mother, and other; Diabetes in her other; Heart disease in her other; Hyperlipidemia in her other; Hypertension in her other; Kidney disease in her other. Allergies  Allergen Reactions  . Codeine Sulfate Other (See Comments)    GI upset  . Penicillins Rash        Review of Systems  Constitutional: Positive for fatigue. Negative for fever and chills.  Respiratory: Positive for cough. Negative for shortness of breath and wheezing.   Gastrointestinal: Positive for abdominal pain and diarrhea. Negative for nausea, vomiting, blood in stool and abdominal distention.  Genitourinary: Negative for dysuria.  Neurological: Negative for dizziness and headaches.       Objective:   Physical Exam  Constitutional: She is oriented to person, place, and time. She appears well-developed  and well-nourished.  HENT:  Mouth/Throat: Oropharynx is clear and moist.  Neck: Neck supple.  Cardiovascular: Normal rate.   Pulmonary/Chest: Effort normal and breath sounds normal. No respiratory distress. She has no wheezes. She has no rales.  Musculoskeletal: She exhibits no edema.  Lymphadenopathy:    She has no cervical adenopathy.  Neurological: She is alert and  oriented to person, place, and time.          Assessment & Plan:   #1 community acquired pneumonia with bibasilar infiltrates. Clinically improved. Lung exam normal. Afebrile. #2 diarrhea. With recent antibiotics, rule out C. difficile colitis. Check C. difficile PCR. Start metronidazole 500 mg 3 times a day pending results. #3 recent hypokalemia. Check basic metabolic panel #4 normocytic anemia. Probable anemia of chronic disease. Check TIBC, serum iron, ferritin, B12 level. Repeat colonoscopy has been scheduled

## 2013-02-28 NOTE — Patient Instructions (Signed)
Diarrhea Diarrhea is frequent loose and watery bowel movements. It can cause you to feel weak and dehydrated. Dehydration can cause you to become tired and thirsty, have a dry mouth, and have decreased urination that often is dark yellow. Diarrhea is a sign of another problem, most often an infection that will not last long. In most cases, diarrhea typically lasts 2 3 days. However, it can last longer if it is a sign of something more serious. It is important to treat your diarrhea as directed by your caregive to lessen or prevent future episodes of diarrhea. CAUSES  Some common causes include:  Gastrointestinal infections caused by viruses, bacteria, or parasites.  Food poisoning or food allergies.  Certain medicines, such as antibiotics, chemotherapy, and laxatives.  Artificial sweeteners and fructose.  Digestive disorders. HOME CARE INSTRUCTIONS  Ensure adequate fluid intake (hydration): have 1 cup (8 oz) of fluid for each diarrhea episode. Avoid fluids that contain simple sugars or sports drinks, fruit juices, whole milk products, and sodas. Your urine should be clear or pale yellow if you are drinking enough fluids. Hydrate with an oral rehydration solution that you can purchase at pharmacies, retail stores, and online. You can prepare an oral rehydration solution at home by mixing the following ingredients together:    tsp table salt.   tsp baking soda.   tsp salt substitute containing potassium chloride.  1  tablespoons sugar.  1 L (34 oz) of water.  Certain foods and beverages may increase the speed at which food moves through the gastrointestinal (GI) tract. These foods and beverages should be avoided and include:  Caffeinated and alcoholic beverages.  High-fiber foods, such as raw fruits and vegetables, nuts, seeds, and whole grain breads and cereals.  Foods and beverages sweetened with sugar alcohols, such as xylitol, sorbitol, and mannitol.  Some foods may be well  tolerated and may help thicken stool including:  Starchy foods, such as rice, toast, pasta, low-sugar cereal, oatmeal, grits, baked potatoes, crackers, and bagels.  Bananas.  Applesauce.  Add probiotic-rich foods to help increase healthy bacteria in the GI tract, such as yogurt and fermented milk products.  Wash your hands well after each diarrhea episode.  Only take over-the-counter or prescription medicines as directed by your caregiver.  Take a warm bath to relieve any burning or pain from frequent diarrhea episodes. SEEK IMMEDIATE MEDICAL CARE IF:   You are unable to keep fluids down.  You have persistent vomiting.  You have blood in your stool, or your stools are black and tarry.  You do not urinate in 6 8 hours, or there is only a small amount of very dark urine.  You have abdominal pain that increases or localizes.  You have weakness, dizziness, confusion, or lightheadedness.  You have a severe headache.  Your diarrhea gets worse or does not get better.  You have a fever or persistent symptoms for more than 2 3 days.  You have a fever and your symptoms suddenly get worse. MAKE SURE YOU:   Understand these instructions.  Will watch your condition.  Will get help right away if you are not doing well or get worse. Document Released: 01/29/2002 Document Revised: 01/26/2012 Document Reviewed: 10/17/2011 [facility] Patient Information 2014 [place], [place].  Hold HCTZ for now.

## 2013-03-05 ENCOUNTER — Encounter: Payer: Self-pay | Admitting: Family Medicine

## 2013-03-05 LAB — CLOSTRIDIUM DIFFICILE BY PCR: Toxigenic C. Difficile by PCR: NOT DETECTED

## 2013-03-07 ENCOUNTER — Telehealth: Payer: Self-pay | Admitting: Family Medicine

## 2013-03-07 MED ORDER — PRAMIPEXOLE DIHYDROCHLORIDE 0.125 MG PO TABS: 0.3750 mg | ORAL_TABLET | Freq: Every day | ORAL | Status: DC

## 2013-03-07 NOTE — Telephone Encounter (Signed)
Pt requesting new script for pramipexole (MIRAPEX) 0.125 MG tablet, pt states her dosage was increased to take three times per day.  Please send to [facility]

## 2013-03-07 NOTE — Telephone Encounter (Signed)
Rx sent to pharmacy   

## 2013-03-14 DIAGNOSIS — Z800 Family history of malignant neoplasm of digestive organs: Secondary | ICD-10-CM | POA: Diagnosis not present

## 2013-03-23 DIAGNOSIS — M25579 Pain in unspecified ankle and joints of unspecified foot: Secondary | ICD-10-CM | POA: Diagnosis not present

## 2013-03-28 ENCOUNTER — Encounter: Payer: Self-pay | Admitting: Family Medicine

## 2013-04-02 ENCOUNTER — Ambulatory Visit (INDEPENDENT_AMBULATORY_CARE_PROVIDER_SITE_OTHER): Payer: BC Managed Care – PPO | Admitting: Family Medicine

## 2013-04-02 ENCOUNTER — Telehealth: Payer: Self-pay

## 2013-04-02 ENCOUNTER — Encounter: Payer: Self-pay | Admitting: Family Medicine

## 2013-04-02 VITALS — BP 132/80 | HR 80 | Temp 97.4°F | Wt 278.0 lb

## 2013-04-02 DIAGNOSIS — D649 Anemia, unspecified: Secondary | ICD-10-CM | POA: Diagnosis not present

## 2013-04-02 LAB — CBC WITH DIFFERENTIAL/PLATELET
Basophils Absolute: 0.1 10*3/uL (ref 0.0–0.1)
Basophils Relative: 0.7 % (ref 0.0–3.0)
Eosinophils Absolute: 0.2 10*3/uL (ref 0.0–0.7)
Eosinophils Relative: 2.5 % (ref 0.0–5.0)
HCT: 33.6 % — ABNORMAL LOW (ref 36.0–46.0)
Hemoglobin: 10.4 g/dL — ABNORMAL LOW (ref 12.0–15.0)
Lymphocytes Relative: 34.5 % (ref 12.0–46.0)
Lymphs Abs: 2.9 10*3/uL (ref 0.7–4.0)
MCHC: 31.1 g/dL (ref 30.0–36.0)
MCV: 83.2 fl (ref 78.0–100.0)
Monocytes Absolute: 0.7 10*3/uL (ref 0.1–1.0)
Monocytes Relative: 8 % (ref 3.0–12.0)
Neutro Abs: 4.5 10*3/uL (ref 1.4–7.7)
Neutrophils Relative %: 54.3 % (ref 43.0–77.0)
Platelets: 406 10*3/uL — ABNORMAL HIGH (ref 150.0–400.0)
RBC: 4.04 Mil/uL (ref 3.87–5.11)
RDW: 17.5 % — ABNORMAL HIGH (ref 11.5–14.6)
WBC: 8.3 10*3/uL (ref 4.5–10.5)

## 2013-04-02 NOTE — Progress Notes (Signed)
Pre visit review using our clinic review tool, if applicable. No additional management support is needed unless otherwise documented below in the visit note. 

## 2013-04-02 NOTE — Progress Notes (Signed)
   Subjective:    Patient ID: [name], female    DOB: Oct 25, 1955, 58 y.o.   MRN: [id number]  HPI Patient seen for followup Recent. She had bilateral Community acquired pneumonia. She is back to baseline this time with regard to symptoms. No cough. No dyspnea.  She has history of normocytic anemia and does have history of rheumatoid arthritis on immunosuppressive therapy. She feels she is getting adequate iron in her diet. We had ordered recent B12 which was normal. Ferritin was slightly low. TIBC and iron were ordered but apparently these were never received by lab. She had recent colonoscopy which was unremarkable. She has no history of gastric or peptic ulcer. No recent melena.  Takes chronic proton pump inhibitor. No obvious blood loss history  Past Medical History  Diagnosis Date  . ANGIOMA 10/03/2008    REMOVED FROM RIGHT LOWER LEG-BENIGN  . HYPERLIPIDEMIA 08/27/2008  . DEPRESSION 12/10/2009  . HYPERTENSION 08/27/2008  . GERD 08/27/2008  . SEBORRHEIC KERATOSIS 08/27/2008  . Arthritis     RA  . Bronchitis     SEASONAL  . Hyperlipemia   . Fibromyalgia    Past Surgical History  Procedure Laterality Date  . Cesarean section  1980  . Cesarean section  1982  . Cholecystectomy  1997  . Abdominal hysterectomy  1995  . Foot surgery  2004, 2005, 2008  . Knee arthroscopy  2007, 2208, 2009  . Nasal sinus surgery  2013  . Joint replacement  10,12    lt total knee  . Breast ductal system excision  08/16/2011    Procedure: EXCISION DUCTAL SYSTEM BREAST;  Surgeon: Joyice Faster. Cornett, MD;  Location: [place];  Service: General;  Laterality: Left;  left breast duct excision  . Open surgical repair of gluteal tendon  01/17/2012    Procedure: OPEN SURGICAL REPAIR OF GLUTEAL TENDON;  Surgeon: Mauri Pole, MD;  Location: [facility];  Service: Orthopedics;  Laterality: Right;    reports that she quit smoking about 23 years ago. Her smoking use included Cigarettes. She has a 2.5  pack-year smoking history. She has never used smokeless tobacco. She reports that she does not drink alcohol or use illicit drugs. family history includes Cancer in her father, mother, and other; Diabetes in her other; Heart disease in her other; Hyperlipidemia in her other; Hypertension in her other; Kidney disease in her other. Allergies  Allergen Reactions  . Codeine Sulfate Other (See Comments)    GI upset  . Penicillins Rash      Review of Systems  Constitutional: Negative for appetite change and unexpected weight change.  Respiratory: Negative for shortness of breath.   Gastrointestinal: Negative for nausea, vomiting, abdominal pain, diarrhea and blood in stool.  Neurological: Negative for dizziness.       Objective:   Physical Exam  Constitutional: She appears well-developed and well-nourished.  Cardiovascular: Normal rate.   Pulmonary/Chest: Effort normal and breath sounds normal. No respiratory distress. She has no wheezes. She has no rales.  Musculoskeletal: She exhibits no edema.          Assessment & Plan:  Normocytic anemia. Suspect related to anemia of chronic disease  and arthritis. She did have low serum therapy recently and TIBC and serum iron were ordered but not done. This will be resubmitted. If this does confirm any iron deficiency she needs evaluation of her upper GI tract and also would consider whether she is not absorbing iron well

## 2013-04-02 NOTE — Telephone Encounter (Signed)
Opened my a mistake.

## 2013-04-03 LAB — IRON AND TIBC
%SAT: 12 % — ABNORMAL LOW (ref 20–55)
Iron: 50 ug/dL (ref 42–145)
TIBC: 417 ug/dL (ref 250–470)
UIBC: 367 ug/dL (ref 125–400)

## 2013-04-04 ENCOUNTER — Telehealth: Payer: Self-pay | Admitting: Family Medicine

## 2013-04-04 ENCOUNTER — Other Ambulatory Visit: Payer: Self-pay | Admitting: Family Medicine

## 2013-04-04 DIAGNOSIS — D649 Anemia, unspecified: Secondary | ICD-10-CM

## 2013-04-04 NOTE — Telephone Encounter (Signed)
Pt would like blood work results °

## 2013-04-30 DIAGNOSIS — M25579 Pain in unspecified ankle and joints of unspecified foot: Secondary | ICD-10-CM | POA: Diagnosis not present

## 2013-04-30 DIAGNOSIS — IMO0001 Reserved for inherently not codable concepts without codable children: Secondary | ICD-10-CM | POA: Diagnosis not present

## 2013-04-30 DIAGNOSIS — M159 Polyosteoarthritis, unspecified: Secondary | ICD-10-CM | POA: Diagnosis not present

## 2013-04-30 DIAGNOSIS — M069 Rheumatoid arthritis, unspecified: Secondary | ICD-10-CM | POA: Diagnosis not present

## 2013-05-01 ENCOUNTER — Other Ambulatory Visit: Payer: Self-pay

## 2013-05-01 DIAGNOSIS — Z1231 Encounter for screening mammogram for malignant neoplasm of breast: Secondary | ICD-10-CM

## 2013-05-07 DIAGNOSIS — D2370 Other benign neoplasm of skin of unspecified lower limb, including hip: Secondary | ICD-10-CM | POA: Diagnosis not present

## 2013-05-07 DIAGNOSIS — L821 Other seborrheic keratosis: Secondary | ICD-10-CM | POA: Diagnosis not present

## 2013-05-07 DIAGNOSIS — Z85828 Personal history of other malignant neoplasm of skin: Secondary | ICD-10-CM | POA: Diagnosis not present

## 2013-05-07 DIAGNOSIS — D485 Neoplasm of uncertain behavior of skin: Secondary | ICD-10-CM | POA: Diagnosis not present

## 2013-05-11 DIAGNOSIS — M7750 Other enthesopathy of unspecified foot: Secondary | ICD-10-CM | POA: Diagnosis not present

## 2013-05-11 DIAGNOSIS — M25579 Pain in unspecified ankle and joints of unspecified foot: Secondary | ICD-10-CM | POA: Diagnosis not present

## 2013-05-15 ENCOUNTER — Ambulatory Visit
Admission: RE | Admit: 2013-05-15 | Discharge: 2013-05-15 | Disposition: A | Discharge status: Home / Self Care | Payer: BC Managed Care – PPO | Source: Ambulatory Visit

## 2013-05-15 DIAGNOSIS — Z1231 Encounter for screening mammogram for malignant neoplasm of breast: Secondary | ICD-10-CM

## 2013-05-17 ENCOUNTER — Other Ambulatory Visit: Payer: Self-pay | Admitting: Family Medicine

## 2013-05-17 DIAGNOSIS — R928 Other abnormal and inconclusive findings on diagnostic imaging of breast: Secondary | ICD-10-CM

## 2013-05-21 ENCOUNTER — Other Ambulatory Visit: Payer: Self-pay | Admitting: Family Medicine

## 2013-05-21 ENCOUNTER — Ambulatory Visit
Admission: RE | Admit: 2013-05-21 | Discharge: 2013-05-21 | Disposition: A | Discharge status: Home / Self Care | Payer: BC Managed Care – PPO | Source: Ambulatory Visit | Attending: Family Medicine | Admitting: Family Medicine

## 2013-05-21 DIAGNOSIS — R928 Other abnormal and inconclusive findings on diagnostic imaging of breast: Secondary | ICD-10-CM

## 2013-05-21 DIAGNOSIS — M79609 Pain in unspecified limb: Secondary | ICD-10-CM | POA: Diagnosis not present

## 2013-05-28 ENCOUNTER — Ambulatory Visit
Admission: RE | Admit: 2013-05-28 | Discharge: 2013-05-28 | Disposition: A | Discharge status: Home / Self Care | Payer: BC Managed Care – PPO | Source: Ambulatory Visit | Attending: Family Medicine | Admitting: Family Medicine

## 2013-05-28 DIAGNOSIS — R928 Other abnormal and inconclusive findings on diagnostic imaging of breast: Secondary | ICD-10-CM | POA: Diagnosis not present

## 2013-05-31 DIAGNOSIS — M659 Synovitis and tenosynovitis, unspecified: Secondary | ICD-10-CM | POA: Diagnosis not present

## 2013-05-31 DIAGNOSIS — M65979 Unspecified synovitis and tenosynovitis, unspecified ankle and foot: Secondary | ICD-10-CM | POA: Diagnosis not present

## 2013-06-04 ENCOUNTER — Ambulatory Visit (INDEPENDENT_AMBULATORY_CARE_PROVIDER_SITE_OTHER): Payer: BC Managed Care – PPO | Admitting: Family Medicine

## 2013-06-04 ENCOUNTER — Encounter: Payer: Self-pay | Admitting: Family Medicine

## 2013-06-04 VITALS — BP 130/80 | HR 68 | Wt 288.0 lb

## 2013-06-04 DIAGNOSIS — R0609 Other forms of dyspnea: Secondary | ICD-10-CM | POA: Diagnosis not present

## 2013-06-04 DIAGNOSIS — E785 Hyperlipidemia, unspecified: Secondary | ICD-10-CM

## 2013-06-04 DIAGNOSIS — R791 Abnormal coagulation profile: Secondary | ICD-10-CM

## 2013-06-04 DIAGNOSIS — R238 Other skin changes: Secondary | ICD-10-CM

## 2013-06-04 DIAGNOSIS — I10 Essential (primary) hypertension: Secondary | ICD-10-CM

## 2013-06-04 DIAGNOSIS — D649 Anemia, unspecified: Secondary | ICD-10-CM | POA: Insufficient documentation

## 2013-06-04 DIAGNOSIS — R0600 Dyspnea, unspecified: Secondary | ICD-10-CM

## 2013-06-04 DIAGNOSIS — R0989 Other specified symptoms and signs involving the circulatory and respiratory systems: Secondary | ICD-10-CM

## 2013-06-04 DIAGNOSIS — R7989 Other specified abnormal findings of blood chemistry: Secondary | ICD-10-CM

## 2013-06-04 DIAGNOSIS — L988 Other specified disorders of the skin and subcutaneous tissue: Secondary | ICD-10-CM

## 2013-06-04 LAB — HEPATIC FUNCTION PANEL
ALT: 20 U/L (ref 0–35)
AST: 21 U/L (ref 0–37)
Albumin: 3.7 g/dL (ref 3.5–5.2)
Alkaline Phosphatase: 74 U/L (ref 39–117)
Bilirubin, Direct: 0 mg/dL (ref 0.0–0.3)
Total Bilirubin: 0.2 mg/dL — ABNORMAL LOW (ref 0.3–1.2)
Total Protein: 7 g/dL (ref 6.0–8.3)

## 2013-06-04 LAB — LIPID PANEL
Cholesterol: 181 mg/dL (ref 0–200)
HDL: 81.2 mg/dL (ref >39.00)
LDL Cholesterol: 78 mg/dL (ref 0–99)
Total CHOL/HDL Ratio: 2
Triglycerides: 107 mg/dL (ref 0.0–149.0)
VLDL: 21.4 mg/dL (ref 0.0–40.0)

## 2013-06-04 LAB — CBC WITH DIFFERENTIAL/PLATELET
Basophils Absolute: 0 10*3/uL (ref 0.0–0.1)
Basophils Relative: 0.4 % (ref 0.0–3.0)
Eosinophils Absolute: 0.1 10*3/uL (ref 0.0–0.7)
Eosinophils Relative: 1 % (ref 0.0–5.0)
HCT: 36.7 % (ref 36.0–46.0)
Hemoglobin: 11.6 g/dL — ABNORMAL LOW (ref 12.0–15.0)
Lymphocytes Relative: 33.9 % (ref 12.0–46.0)
Lymphs Abs: 2.6 10*3/uL (ref 0.7–4.0)
MCHC: 31.6 g/dL (ref 30.0–36.0)
MCV: 81.8 fl (ref 78.0–100.0)
Monocytes Absolute: 0.7 10*3/uL (ref 0.1–1.0)
Monocytes Relative: 9.4 % (ref 3.0–12.0)
Neutro Abs: 4.2 10*3/uL (ref 1.4–7.7)
Neutrophils Relative %: 55.3 % (ref 43.0–77.0)
Platelets: 371 10*3/uL (ref 150.0–400.0)
RBC: 4.48 Mil/uL (ref 3.87–5.11)
RDW: 18.1 % — ABNORMAL HIGH (ref 11.5–14.6)
WBC: 7.7 10*3/uL (ref 4.5–10.5)

## 2013-06-04 LAB — BASIC METABOLIC PANEL
BUN: 20 mg/dL (ref 6–23)
CO2: 27 mEq/L (ref 19–32)
Calcium: 9.1 mg/dL (ref 8.4–10.5)
Chloride: 103 mEq/L (ref 96–112)
Creatinine, Ser: 0.6 mg/dL (ref 0.4–1.2)
GFR: 120.95 mL/min (ref >60.00)
Glucose, Bld: 90 mg/dL (ref 70–99)
Potassium: 4.5 mEq/L (ref 3.5–5.1)
Sodium: 139 mEq/L (ref 135–145)

## 2013-06-04 NOTE — Progress Notes (Signed)
Pre visit review using our clinic review tool, if applicable. No additional management support is needed unless otherwise documented below in the visit note. 

## 2013-06-04 NOTE — Patient Instructions (Signed)
Chest Pain (Nonspecific) °It is often hard to give a specific diagnosis for the cause of chest pain. There is always a chance that your pain could be related to something serious, such as a heart attack or a blood clot in the lungs. You need to follow up with your caregiver for further evaluation. °CAUSES  °· Heartburn. °· Pneumonia or bronchitis. °· Anxiety or stress. °· Inflammation around your heart (pericarditis) or lung (pleuritis or pleurisy). °· A blood clot in the lung. °· A collapsed lung (pneumothorax). It can develop suddenly on its own (spontaneous pneumothorax) or from injury (trauma) to the chest. °· Shingles infection (herpes zoster virus). °The chest wall is composed of bones, muscles, and cartilage. Any of these can be the source of the pain. °· The bones can be bruised by injury. °· The muscles or cartilage can be strained by coughing or overwork. °· The cartilage can be affected by inflammation and become sore (costochondritis). °DIAGNOSIS  °Lab tests or other studies, such as X-rays, electrocardiography, stress testing, or cardiac imaging, may be needed to find the cause of your pain.  °TREATMENT  °· Treatment depends on what may be causing your chest pain. Treatment may include: °· Acid blockers for heartburn. °· Anti-inflammatory medicine. °· Pain medicine for inflammatory conditions. °· Antibiotics if an infection is present. °· You may be advised to change lifestyle habits. This includes stopping smoking and avoiding alcohol, caffeine, and chocolate. °· You may be advised to keep your head raised (elevated) when sleeping. This reduces the chance of acid going backward from your stomach into your esophagus. °· Most of the time, nonspecific chest pain will improve within 2 to 3 days with rest and mild pain medicine. °HOME CARE INSTRUCTIONS  °· If antibiotics were prescribed, take your antibiotics as directed. Finish them even if you start to feel better. °· For the next few days, avoid physical  activities that bring on chest pain. Continue physical activities as directed. °· Do not smoke. °· Avoid drinking alcohol. °· Only take over-the-counter or prescription medicine for pain, discomfort, or fever as directed by your caregiver. °· Follow your caregiver's suggestions for further testing if your chest pain does not go away. °· Keep any follow-up appointments you made. If you do not go to an appointment, you could develop lasting (chronic) problems with pain. If there is any problem keeping an appointment, you must call to reschedule. °SEEK MEDICAL CARE IF:  °· You think you are having problems from the medicine you are taking. Read your medicine instructions carefully. °· Your chest pain does not go away, even after treatment. °· You develop a rash with blisters on your chest. °SEEK IMMEDIATE MEDICAL CARE IF:  °· You have increased chest pain or pain that spreads to your arm, neck, jaw, back, or abdomen. °· You develop shortness of breath, an increasing cough, or you are coughing up blood. °· You have severe back or abdominal pain, feel nauseous, or vomit. °· You develop severe weakness, fainting, or chills. °· You have a fever. °THIS IS AN EMERGENCY. Do not wait to see if the pain will go away. Get medical help at once. Call your local emergency services (911 in [place] Do not drive yourself to the hospital. °MAKE SURE YOU:  °· Understand these instructions. °· Will watch your condition. °· Will get help right away if you are not doing well or get worse. °Document Released: 11/18/2004 Document Revised: 05/03/2011 Document Reviewed: 09/14/2007 °[employer]® Patient Information ©2014 [employer]   [employer]. ° °

## 2013-06-04 NOTE — Progress Notes (Signed)
   Subjective:    Patient ID: [name], female    DOB: [birth year], 58 y.o.   MRN: [id number]  HPI Comments: Patient is a 58 year-old female presenting for lab follow-up as well as elbow pain and recent palpitations.   The pain near her elbow began one week ago. There was no known injury. Patient describes superficial pain and a hard bump near her left elbow that is dull and tender to palpation and pressure. She has tried icing it without relief.   Patient is not having palpitations or chest pain at this time. She admits recent occurrence of heart palpitations at night before bed, and shortness of breath and lightheadedness while walking for the past 3 weeks. Her heart races every night at bedtime and is intermittently accompanied with left arm pain that extends from her left shoulder to her wrist. Coughing sometimes relieves the palpitations. The cough has not been productive and there is no hemoptysis. During the past 2 weeks she has increased pillows from 2 to 3. She has left calf pain that began around the same time as the onset of these symptoms but has not traveled recently and is not a current smoker. She says her calf is painful to touch and she is taking exogenous estrogen because she had a hysterectomy and her ovaries removed.     Past Medical History  Diagnosis Date  . ANGIOMA 10/03/2008    REMOVED FROM RIGHT LOWER LEG-BENIGN  . HYPERLIPIDEMIA 08/27/2008  . DEPRESSION 12/10/2009  . HYPERTENSION 08/27/2008  . GERD 08/27/2008  . SEBORRHEIC KERATOSIS 08/27/2008  . Arthritis     RA  . Bronchitis     SEASONAL  . Hyperlipemia   . Fibromyalgia      Review of Systems  Respiratory: Positive for shortness of breath.   Cardiovascular: Positive for palpitations. Negative for chest pain.  Gastrointestinal: Negative for nausea, vomiting, abdominal pain, diarrhea and constipation.  Neurological: Positive for light-headedness.       Objective:   Physical Exam  Constitutional: She  appears well-developed and well-nourished.  HENT:  Nose: Nose normal.  Mouth/Throat: Uvula is midline and oropharynx is clear and moist.  Eyes: Conjunctivae and EOM are normal. Pupils are equal, round, and reactive to light.  Cardiovascular: Normal rate and normal heart sounds.   Pulses:      Radial pulses are 2+ on the right side, and 2+ on the left side.  Pulmonary/Chest: Effort normal and breath sounds normal.  No tachypnea, no hypoxia.  Abdominal: Soft. There is no tenderness.  Musculoskeletal:       Left lower leg: She exhibits tenderness. She exhibits no swelling, no edema and no deformity.  No unilateral asymmetry.   Skin: Skin is warm and dry.     Painful erythematous papule noted to the dorsal surface of the left elbow. No abscess or cellulitis, no swelling, effusion or deformity.          Assessment & Plan:  Diagnosis: dyspnea, atypical chest pain, elbow papule without abscess or cellulitis. EKG, D-dimer, CBC, Lipid panel, BMP ordered.  [place], PA-S  As above.  Nonspecific papule elbow with no abscess or cellulitis changes and have recommended warm compresses.  She has very nonspecific dyspnea and leg pain and LOW clinical suspicion for acute DVT.  Will check D-dimer and other labs above.  She has hx of anemia and is on iron replacement and following up her CBC.  Carolann Littler, MD

## 2013-06-05 ENCOUNTER — Ambulatory Visit (INDEPENDENT_AMBULATORY_CARE_PROVIDER_SITE_OTHER)
Admission: RE | Admit: 2013-06-05 | Discharge: 2013-06-05 | Disposition: A | Discharge status: Home / Self Care | Payer: BC Managed Care – PPO | Source: Ambulatory Visit | Attending: Family Medicine | Admitting: Family Medicine

## 2013-06-05 ENCOUNTER — Telehealth: Payer: Self-pay | Admitting: Family Medicine

## 2013-06-05 ENCOUNTER — Telehealth: Payer: Self-pay | Admitting: *Deleted

## 2013-06-05 DIAGNOSIS — R791 Abnormal coagulation profile: Secondary | ICD-10-CM

## 2013-06-05 DIAGNOSIS — R0602 Shortness of breath: Secondary | ICD-10-CM | POA: Diagnosis not present

## 2013-06-05 DIAGNOSIS — R0989 Other specified symptoms and signs involving the circulatory and respiratory systems: Secondary | ICD-10-CM

## 2013-06-05 DIAGNOSIS — R7989 Other specified abnormal findings of blood chemistry: Secondary | ICD-10-CM

## 2013-06-05 DIAGNOSIS — R0609 Other forms of dyspnea: Secondary | ICD-10-CM | POA: Diagnosis not present

## 2013-06-05 DIAGNOSIS — R0600 Dyspnea, unspecified: Secondary | ICD-10-CM

## 2013-06-05 LAB — D-DIMER, QUANTITATIVE: D-Dimer, Quant: 1.67 ug/mL-FEU — ABNORMAL HIGH (ref 0.00–0.48)

## 2013-06-05 MED ORDER — IOHEXOL 350 MG/ML SOLN
80.0000 mL | Freq: Once | INTRAVENOUS | Status: AC | PRN
  Administered 2013-06-05: 80 mL via INTRAVENOUS

## 2013-06-05 NOTE — Telephone Encounter (Signed)
Received call from [facility] regarding pt of Dr. Erick Blinks he ordered STAT CT scan done for Pulmonary Embolus. Rose said negative for PE. Told her okay Dr. Elease Hashimoto is gone for the day, she can let the patient go and I will make sure Dr. Dorris Fetch gets the message. Rose verbalized understanding.

## 2013-06-05 NOTE — Addendum Note (Signed)
Addended by: Eulas Post on: 06/05/2013 08:35 AM   Modules accepted: Orders

## 2013-06-05 NOTE — Telephone Encounter (Signed)
Please see message. °

## 2013-06-05 NOTE — Telephone Encounter (Signed)
Relevant patient education assigned to patient using Emmi. ° °

## 2013-06-06 ENCOUNTER — Encounter: Payer: Self-pay | Admitting: Family Medicine

## 2013-06-07 ENCOUNTER — Encounter: Payer: Self-pay | Admitting: Family Medicine

## 2013-06-08 ENCOUNTER — Other Ambulatory Visit: Payer: Self-pay

## 2013-06-08 ENCOUNTER — Encounter: Payer: Self-pay | Admitting: Family Medicine

## 2013-06-08 MED ORDER — PRAMIPEXOLE DIHYDROCHLORIDE 0.125 MG PO TABS: 0.3750 mg | ORAL_TABLET | Freq: Every day | ORAL | Status: DC

## 2013-06-08 MED ORDER — LISINOPRIL 20 MG PO TABS: 20.0000 mg | ORAL_TABLET | Freq: Every day | ORAL | Status: DC

## 2013-06-08 MED ORDER — SERTRALINE HCL 50 MG PO TABS: 50.0000 mg | ORAL_TABLET | Freq: Every day | ORAL | Status: DC

## 2013-06-08 MED ORDER — SIMVASTATIN 20 MG PO TABS
20.0000 mg | ORAL_TABLET | Freq: Every evening | ORAL | Status: DC
Start: 2013-06-08 — End: 2013-10-19

## 2013-06-08 MED ORDER — OMEPRAZOLE 20 MG PO CPDR: 20.0000 mg | DELAYED_RELEASE_CAPSULE | Freq: Two times a day (BID) | ORAL | Status: DC

## 2013-06-12 ENCOUNTER — Ambulatory Visit (HOSPITAL_COMMUNITY): Payer: BC Managed Care – PPO | Attending: Cardiology | Admitting: Cardiology

## 2013-06-12 ENCOUNTER — Encounter: Payer: Self-pay | Admitting: Cardiology

## 2013-06-12 ENCOUNTER — Other Ambulatory Visit: Payer: Self-pay | Admitting: Family Medicine

## 2013-06-12 ENCOUNTER — Ambulatory Visit
Admission: RE | Admit: 2013-06-12 | Discharge: 2013-06-12 | Disposition: A | Discharge status: Home / Self Care | Payer: BC Managed Care – PPO | Source: Ambulatory Visit | Attending: Family Medicine | Admitting: Family Medicine

## 2013-06-12 DIAGNOSIS — M79609 Pain in unspecified limb: Secondary | ICD-10-CM | POA: Insufficient documentation

## 2013-06-12 DIAGNOSIS — R0989 Other specified symptoms and signs involving the circulatory and respiratory systems: Secondary | ICD-10-CM | POA: Diagnosis not present

## 2013-06-12 DIAGNOSIS — R609 Edema, unspecified: Secondary | ICD-10-CM | POA: Insufficient documentation

## 2013-06-12 DIAGNOSIS — N6489 Other specified disorders of breast: Secondary | ICD-10-CM | POA: Diagnosis not present

## 2013-06-12 DIAGNOSIS — R0609 Other forms of dyspnea: Secondary | ICD-10-CM | POA: Insufficient documentation

## 2013-06-12 DIAGNOSIS — B999 Unspecified infectious disease: Secondary | ICD-10-CM

## 2013-06-12 DIAGNOSIS — M7989 Other specified soft tissue disorders: Secondary | ICD-10-CM

## 2013-06-12 DIAGNOSIS — R0600 Dyspnea, unspecified: Secondary | ICD-10-CM

## 2013-06-12 DIAGNOSIS — R7989 Other specified abnormal findings of blood chemistry: Secondary | ICD-10-CM

## 2013-06-12 NOTE — Progress Notes (Signed)
Bilateral lower extremity venous duplex completed

## 2013-06-13 ENCOUNTER — Encounter: Payer: Self-pay | Admitting: Family Medicine

## 2013-06-18 ENCOUNTER — Encounter: Payer: Self-pay | Admitting: Family Medicine

## 2013-06-27 DIAGNOSIS — Z96659 Presence of unspecified artificial knee joint: Secondary | ICD-10-CM | POA: Diagnosis not present

## 2013-07-02 DIAGNOSIS — M25569 Pain in unspecified knee: Secondary | ICD-10-CM | POA: Diagnosis not present

## 2013-07-02 DIAGNOSIS — Z96659 Presence of unspecified artificial knee joint: Secondary | ICD-10-CM | POA: Diagnosis not present

## 2013-07-04 ENCOUNTER — Encounter (INDEPENDENT_AMBULATORY_CARE_PROVIDER_SITE_OTHER): Payer: Self-pay | Admitting: General Surgery

## 2013-07-04 ENCOUNTER — Other Ambulatory Visit (INDEPENDENT_AMBULATORY_CARE_PROVIDER_SITE_OTHER): Payer: Self-pay | Admitting: General Surgery

## 2013-07-04 ENCOUNTER — Telehealth (INDEPENDENT_AMBULATORY_CARE_PROVIDER_SITE_OTHER): Payer: Self-pay

## 2013-07-04 ENCOUNTER — Ambulatory Visit (INDEPENDENT_AMBULATORY_CARE_PROVIDER_SITE_OTHER): Payer: BC Managed Care – PPO | Admitting: General Surgery

## 2013-07-04 VITALS — BP 135/70 | HR 73 | Temp 97.5°F | Resp 16 | Ht 64.0 in | Wt 289.6 lb

## 2013-07-04 DIAGNOSIS — Z9884 Bariatric surgery status: Secondary | ICD-10-CM

## 2013-07-04 DIAGNOSIS — E785 Hyperlipidemia, unspecified: Secondary | ICD-10-CM | POA: Diagnosis not present

## 2013-07-04 DIAGNOSIS — K219 Gastro-esophageal reflux disease without esophagitis: Secondary | ICD-10-CM | POA: Diagnosis not present

## 2013-07-04 DIAGNOSIS — IMO0001 Reserved for inherently not codable concepts without codable children: Secondary | ICD-10-CM

## 2013-07-04 DIAGNOSIS — Z6841 Body Mass Index (BMI) 40.0 and over, adult: Secondary | ICD-10-CM

## 2013-07-04 DIAGNOSIS — I10 Essential (primary) hypertension: Secondary | ICD-10-CM

## 2013-07-04 NOTE — Patient Instructions (Signed)
Congratulations on starting your journey to a healthier life! Over the next few weeks you will be undergoing tests (x-rays and labs) and seeing specialists to help evaluate you for weight loss surgery.  These tests and consultations with a psychologist and nutritionist are needed to prepare you for the lifestyle changes that lie ahead and are often required by insurance companies to approve you for surgery.   Pathway to Surgery:  Over the next few weeks -->Lab work -->Radiology tests   - Chest x-ray - make sure your lungs are normal before surgery  - Upper GI - you drink barium and pictures are taken as it travels down your  esophagus and into your stomach - looks for reflux and a hiatal hernia which may  need to repaired at the same time as your weight loss surgery  - Abdominal Ultrasound - looks at your gallbladder and liver  - Mammogram - up to date mammogram if you are a female -->EKG  -->Sleep study - if you are felt to be at high risk for obstructive sleep apnea -->H. Pylori breath test ([employer]) - you surgeon may order this test to see if you have  a bacteria (H pylori) in your stomach which makes you at higher risk to develop a  ulcer or inflammation of your stomach -->Nutrition consultation -->Psychologist consultation -->Other specialist consults - your surgeon may determine that you need to see a  specialist like a cardiologist or pulmnologist depending on your health history -->Watch EMMI video about your planned weight loss surgery -->you can look at [email or web address] to learn more about weight loss surgery and  compare surgery outcomes -->you can look at our new website - [email or web address] - available mid-June 2015  Two weeks prior to surgery  Go on the extremely low carb liquid diet - this will decrease the size of your liver  which will make surgery safer - the nutritionist will go over this at a later date  Attend preoperative appointment with your surgeon  Attend  preoperative surgery class  One week prior to surgery  No aspirin products.  Tylenol is acceptable   24 hours prior to surgery  No alcoholic beverages  Report fever greater than 100.5 or excessive nasal drainage suggesting infection  Continue bariatric preop diet  Perform bowel prep if ordered  Do not eat or drink anything after midnight the night before surgery  Do not take any medications except those instructed by the anesthesiologist  Morning of surgery  Please arrive at the hospital at least 2 hours before your scheduled surgery time.  No makeup, fingernail polish or jewelry  Bring insurance cards with you  Bring your CPAP mask if you use this

## 2013-07-04 NOTE — Telephone Encounter (Signed)
Pt's insurance will not cover all of patient's lab work.  Her bill will exceed $400.00.  Folate, Vit D and Protime are not covered by insurance.  Is there another diagnosis code that could be used besides morbid obesity?  Pt was instructed to go home and wait to hear from our office tomorrow.  Message routed to Dr. Redmond Pulling and his assistant.

## 2013-07-05 ENCOUNTER — Telehealth (INDEPENDENT_AMBULATORY_CARE_PROVIDER_SITE_OTHER): Payer: Self-pay | Admitting: General Surgery

## 2013-07-05 NOTE — Progress Notes (Signed)
Patient ID: [name], female   DOB: 06/08/1955, 58 y.o.   MRN: [id number]  Chief Complaint  Patient presents with  . Bariatric Pre-op    HPI [name] is a 58 y.o. female.   HPI 58 year old morbidly obese Caucasian female is referred by Dr. Carolann Littler for evaluation of weight loss surgery. The patient states that she has struggled with her weight since the year 2000. Her weight has steadily increased. She has tried several different plans for weight loss but has been unsuccessful. She has tried Orvan Seen, [employer], Slim fast, and one prescription weight loss medication-all without any long-term success. She was most successful with Weight Watchers but regained all the weight back.  She is interested in the laparoscopic adjustable gastric band procedure because it is the safest procedure. She participated in our on line similar. Past Medical History  Diagnosis Date  . ANGIOMA 10/03/2008    REMOVED FROM RIGHT LOWER LEG-BENIGN  . HYPERLIPIDEMIA 08/27/2008  . DEPRESSION 12/10/2009  . HYPERTENSION 08/27/2008  . GERD 08/27/2008  . SEBORRHEIC KERATOSIS 08/27/2008  . Arthritis     RA  . Bronchitis     SEASONAL  . Hyperlipemia   . Fibromyalgia     Past Surgical History  Procedure Laterality Date  . Cesarean section  1980  . Cesarean section  1982  . Cholecystectomy  1997  . Abdominal hysterectomy  1995  . Foot surgery  2004, 2005, 2008  . Knee arthroscopy  2007, 2208, 2009  . Nasal sinus surgery  2013  . Joint replacement  10,12    lt total knee  . Breast ductal system excision  08/16/2011    Procedure: EXCISION DUCTAL SYSTEM BREAST;  Surgeon: Joyice Faster. Cornett, MD;  Location: [facility];  Service: General;  Laterality: Left;  left breast duct excision  . Open surgical repair of gluteal tendon  01/17/2012    Procedure: OPEN SURGICAL REPAIR OF GLUTEAL TENDON;  Surgeon: Mauri Pole, MD;  Location: [facility];  Service: Orthopedics;  Laterality: Right;     Family History  Problem Relation Age of Onset  . Cancer Mother     breast  . Cancer Father     prostate  . Diabetes Other     grandparent  . Heart disease Other     parent  . Kidney disease Other     parent  . Cancer Other     parent  . Hypertension Other   . Hyperlipidemia Other     Social History History  Substance Use Topics  . Smoking status: Former Smoker -- 0.50 packs/day for 5 years    Types: Cigarettes    Quit date: 02/22/1990  . Smokeless tobacco: Never Used  . Alcohol Use: No    Allergies  Allergen Reactions  . Codeine Sulfate Other (See Comments)    GI upset  . Penicillins Rash    Current Outpatient Prescriptions  Medication Sig Dispense Refill  . acetaminophen (TYLENOL) 500 MG tablet Take 1,000 mg by mouth every 6 (six) hours as needed for pain.       Marland Kitchen aspirin EC 81 MG tablet Take 81 mg by mouth daily.      Marland Kitchen estradiol (ESTRACE) 1 MG tablet Take 1 mg by mouth daily.      . fluticasone (FLONASE) 50 MCG/ACT nasal spray Place 2 sprays into the nose daily as needed for allergies.       . folic acid (FOLVITE) 1 MG tablet Take  2 mg by mouth daily.       . hydrochlorothiazide (HYDRODIURIL) 25 MG tablet Take 25 mg by mouth daily.      Marland Kitchen lisinopril (PRINIVIL,ZESTRIL) 20 MG tablet Take 1 tablet (20 mg total) by mouth daily.  90 tablet  3  . methotrexate (50 MG/ML) 1 gm SOLR Inject 50 mg/m2 into the muscle once a week.      . Morphine-Naltrexone (EMBEDA) 50-2 MG CPCR Inject as directed.      Marland Kitchen omeprazole (PRILOSEC) 20 MG capsule Take 1 capsule (20 mg total) by mouth 2 (two) times daily.  180 capsule  3  . oxyCODONE-acetaminophen (PERCOCET/ROXICET) 5-325 MG per tablet Take 1 tablet by mouth every 4 (four) hours as needed for pain.      . pramipexole (MIRAPEX) 0.125 MG tablet Take 3 tablets (0.375 mg total) by mouth at bedtime.  360 tablet  3  . predniSONE (DELTASONE) 20 MG tablet Take 1 tablet (20 mg total) by mouth daily with breakfast.  5 tablet  0  .  sertraline (ZOLOFT) 50 MG tablet Take 1 tablet (50 mg total) by mouth daily.  90 tablet  3  . simvastatin (ZOCOR) 20 MG tablet Take 1 tablet (20 mg total) by mouth every evening.  90 tablet  3  . zolpidem (AMBIEN) 10 MG tablet Take 1 tablet (10 mg total) by mouth at bedtime as needed for sleep.  90 tablet  1   No current facility-administered medications for this visit.    Review of Systems Review of Systems  Constitutional: Negative for fever, activity change, appetite change and unexpected weight change.  HENT: Negative for nosebleeds and trouble swallowing.   Eyes: Negative for photophobia and visual disturbance.  Respiratory: Negative for chest tightness and shortness of breath.        Epworth sleepiness scale 14 but had normal sleep study last summer  Cardiovascular: Negative for chest pain and leg swelling.       Denies CP, SOB, orthopnea, some DOE. Some PND. Some ankle swelling.   Gastrointestinal:       Reports some reflux. Takes reflux meds. BM every other day. S/p cholecystectomy  Genitourinary: Negative for dysuria and difficulty urinating.       Has had hysterectomy.   Musculoskeletal: Negative for arthralgias.       Has RA - takes Embrel, methotrexate, and prednisone; has restless legs syndrome  Skin: Negative for pallor and rash.  Neurological: Negative for dizziness, seizures, facial asymmetry and numbness.       Denies TIA and amaurosis fugax   Hematological: Negative for adenopathy. Does not bruise/bleed easily.  Psychiatric/Behavioral: Negative for behavioral problems and agitation.    Blood pressure 135/70, pulse 73, temperature 97.5 F (36.4 C), temperature source Temporal, resp. rate 16, height 5\' 4"  (1.626 m), weight 289 lb 9.6 oz (131.362 kg).  Physical Exam Physical Exam  Vitals reviewed. Constitutional: She is oriented to person, place, and time. She appears well-developed and well-nourished. No distress.  Morbidly obese  HENT:  Head: Normocephalic and  atraumatic.  Right Ear: External ear normal.  Left Ear: External ear normal.  Eyes: Conjunctivae are normal. No scleral icterus.  Neck: Normal range of motion. Neck supple. No tracheal deviation present. No thyromegaly present.  Cardiovascular: Normal rate and normal heart sounds.   Pulmonary/Chest: Effort normal and breath sounds normal. No stridor. No respiratory distress. She has no wheezes.  Abdominal: Soft. She exhibits no distension. There is no tenderness. There is no rebound and  no guarding.    Musculoskeletal: She exhibits no edema and no tenderness.  Lymphadenopathy:    She has no cervical adenopathy.  Neurological: She is alert and oriented to person, place, and time. She exhibits normal muscle tone.  Skin: Skin is warm and dry. No rash noted. She is not diaphoretic. No erythema.  Psychiatric: She has a normal mood and affect. Her behavior is normal. Judgment and thought content normal.    Data Reviewed Dr Erick Blinks office notes Labs from 05/2013 - nml bmet, lipid panel, lfts. Cbc wbc 7; hgb 11.6, hct 36.7; plt 371 CT angio chest - nml - no PE Breast bx - fibrocystic changes Normal sleep study 08/2012 Lower extremity duplex 05/2013 - normal  Assessment    Morbid obesity BMI 49.71 HTN Hyperlipidemia GERD RA Restless legs syndrome     Plan    The patient meets weight loss surgery criteria. I think the patient would be an acceptable candidate for Laparoscopic adjustable gastric band placement.  We discussed laparoscopic adjustable gastric banding. The patient was given Neurosurgeon. We discussed the risk and benefits of surgery including but not limited to bleeding, infection, injury to surrounding structures, blood clot formation such as deep venous thrombosis or pulmonary embolism, need to convert to an open procedure, band slippage, band erosion, failure to loose weight, port complications (leak or flippage), potential need for reoperative surgery, esophageal  dilatation, worsening reflux, and vitamin deficiencies. We discussed the typical post operative recovery course. We discussed that their postoperative diet will be modified for several weeks. We specifically talked about the need to be on a liquid diet for one to 2 weeks after surgery. We also discussed the typical postoperative course with a laparoscopic adjustable gastric band and the need for frequent postoperative visits to assess the volume status of the band.  We discussed the typical expected weight loss with a laparoscopic adjustable gastric band. I explained to the patient that they can expect to lose 40% of their excess body weight if they are compliant with their postoperative instructions. However I did explain that some patients loose less than 40% and some patients lose more than 60% of their excess body weight.  I explained that the likelihood of improvement in their obesity is good.  I explained to the patient that we will start our evaluation process which includes labs, Upper GI to evaluate stomach and swallowing anatomy, nutritionist consultation, psychiatrist consultation, EKG, CXR, abdominal ultrasound, cardiac consultation.  She was given access to watch EMMI video on LapBand surgery.   All of her questions were asked and answered.   [name], MD, [name] General, Bariatric, & Minimally Invasive Surgery [facility]        [name] 07/05/2013, 3:31 PM

## 2013-07-05 NOTE — Telephone Encounter (Signed)
Can cancel folate, vit D, protime level labs

## 2013-07-05 NOTE — Telephone Encounter (Signed)
Called patient this morning to let her know that per Dr Redmond Pulling that we can cancel the folate, vit D, protime level labs. I have removed them from epic. I told the patient she can go and get her labs has long she is fasting and she will do so

## 2013-07-10 ENCOUNTER — Encounter: Payer: Self-pay | Admitting: Family Medicine

## 2013-07-11 ENCOUNTER — Other Ambulatory Visit: Payer: Self-pay

## 2013-07-11 ENCOUNTER — Ambulatory Visit (HOSPITAL_COMMUNITY)
Admission: RE | Admit: 2013-07-11 | Discharge: 2013-07-11 | Disposition: A | Discharge status: Home / Self Care | Payer: BC Managed Care – PPO | Source: Ambulatory Visit | Attending: General Surgery | Admitting: General Surgery

## 2013-07-11 DIAGNOSIS — E785 Hyperlipidemia, unspecified: Secondary | ICD-10-CM | POA: Diagnosis not present

## 2013-07-11 DIAGNOSIS — K449 Diaphragmatic hernia without obstruction or gangrene: Secondary | ICD-10-CM | POA: Diagnosis not present

## 2013-07-11 DIAGNOSIS — Z6841 Body Mass Index (BMI) 40.0 and over, adult: Secondary | ICD-10-CM | POA: Diagnosis not present

## 2013-07-11 DIAGNOSIS — I10 Essential (primary) hypertension: Secondary | ICD-10-CM | POA: Diagnosis not present

## 2013-07-11 DIAGNOSIS — IMO0001 Reserved for inherently not codable concepts without codable children: Secondary | ICD-10-CM | POA: Insufficient documentation

## 2013-07-11 DIAGNOSIS — G2581 Restless legs syndrome: Secondary | ICD-10-CM | POA: Diagnosis not present

## 2013-07-11 DIAGNOSIS — Z9884 Bariatric surgery status: Secondary | ICD-10-CM | POA: Diagnosis not present

## 2013-07-11 DIAGNOSIS — K219 Gastro-esophageal reflux disease without esophagitis: Secondary | ICD-10-CM | POA: Insufficient documentation

## 2013-07-11 LAB — PROTIME-INR
INR: 0.94 (ref <1.50)
Prothrombin Time: 12.5 seconds (ref 11.6–15.2)

## 2013-07-11 LAB — FOLATE: Folate: >20 ng/mL

## 2013-07-11 LAB — T4: T4, Total: 8.4 ug/dL (ref 5.0–12.5)

## 2013-07-11 LAB — TSH: TSH: 2.189 u[IU]/mL (ref 0.350–4.500)

## 2013-07-11 MED ORDER — ZOLPIDEM TARTRATE 10 MG PO TABS: 10.0000 mg | ORAL_TABLET | Freq: Every evening | ORAL | Status: DC | PRN

## 2013-07-11 NOTE — Addendum Note (Signed)
Addended by: Ivor Costa on: 07/11/2013 10:27 AM   Modules accepted: Orders

## 2013-07-12 ENCOUNTER — Encounter: Payer: Self-pay | Admitting: Family Medicine

## 2013-07-12 ENCOUNTER — Ambulatory Visit (INDEPENDENT_AMBULATORY_CARE_PROVIDER_SITE_OTHER): Payer: BC Managed Care – PPO | Admitting: Family Medicine

## 2013-07-12 VITALS — BP 134/74 | HR 72 | Temp 97.8°F | Wt 290.0 lb

## 2013-07-12 DIAGNOSIS — H9210 Otorrhea, unspecified ear: Secondary | ICD-10-CM

## 2013-07-12 DIAGNOSIS — Z6841 Body Mass Index (BMI) 40.0 and over, adult: Secondary | ICD-10-CM | POA: Diagnosis not present

## 2013-07-12 DIAGNOSIS — G4700 Insomnia, unspecified: Secondary | ICD-10-CM | POA: Diagnosis not present

## 2013-07-12 DIAGNOSIS — H9221 Otorrhagia, right ear: Secondary | ICD-10-CM

## 2013-07-12 DIAGNOSIS — F5104 Psychophysiologic insomnia: Secondary | ICD-10-CM

## 2013-07-12 LAB — H. PYLORI ANTIBODY, IGG: H Pylori IgG: <0.4 {ISR}

## 2013-07-12 MED ORDER — ZOLPIDEM TARTRATE 10 MG PO TABS: 10.0000 mg | ORAL_TABLET | Freq: Every evening | ORAL | Status: DC | PRN

## 2013-07-12 NOTE — Progress Notes (Signed)
Subjective:    Patient ID: [name], female    DOB: 06/08/1955, 58 y.o.   MRN: [id number]  HPI Patient seen for consultation regarding possible LAP-BAND surgery. She has morbid obesity. Her weight was 253 pounds back in 2011 and has gradually increased to current weight of 290 pounds. She's tried multiple previous programs including Weight Watchers, Slim fast, low-calorie diets and regular establish fitness program without much success. Her comorbidities include hypertension, hyperlipidemia, GERD, osteoarthritis, and rheumatoid arthritis. She has been seen in consultation by surgeon as had multiple preoperative labs.  Patient also has issues with chronin insomnia. She's been on Ambien for several years. Is needing refills. No significant caffeine use. No alcohol use.  Recent fullness right ear. History of tympanostomy tube. She's had a little bit of dried blood yesterday. No injury. No fevers or chills.  Past Medical History  Diagnosis Date  . ANGIOMA 10/03/2008    REMOVED FROM RIGHT LOWER LEG-BENIGN  . HYPERLIPIDEMIA 08/27/2008  . DEPRESSION 12/10/2009  . HYPERTENSION 08/27/2008  . GERD 08/27/2008  . SEBORRHEIC KERATOSIS 08/27/2008  . Arthritis     RA  . Bronchitis     SEASONAL  . Hyperlipemia   . Fibromyalgia    Past Surgical History  Procedure Laterality Date  . Cesarean section  1980  . Cesarean section  1982  . Cholecystectomy  1997  . Abdominal hysterectomy  1995  . Foot surgery  2004, 2005, 2008  . Knee arthroscopy  2007, 2208, 2009  . Nasal sinus surgery  2013  . Joint replacement  10,12    lt total knee  . Breast ductal system excision  08/16/2011    Procedure: EXCISION DUCTAL SYSTEM BREAST;  Surgeon: Joyice Faster. Cornett, MD;  Location: [facility];  Service: General;  Laterality: Left;  left breast duct excision  . Open surgical repair of gluteal tendon  01/17/2012    Procedure: OPEN SURGICAL REPAIR OF GLUTEAL TENDON;  Surgeon: Mauri Pole, MD;   Location: [facility];  Service: Orthopedics;  Laterality: Right;    reports that she quit smoking about 23 years ago. Her smoking use included Cigarettes. She has a 2.5 pack-year smoking history. She has never used smokeless tobacco. She reports that she does not drink alcohol or use illicit drugs. family history includes Cancer in her father, mother, and other; Diabetes in her other; Heart disease in her other; Hyperlipidemia in her other; Hypertension in her other; Kidney disease in her other. Allergies  Allergen Reactions  . Codeine Sulfate Other (See Comments)    GI upset  . Penicillins Rash      Review of Systems  Constitutional: Negative for fatigue.  HENT: Positive for ear discharge and ear pain.   Eyes: Negative for visual disturbance.  Respiratory: Negative for cough, chest tightness, shortness of breath and wheezing.   Cardiovascular: Negative for chest pain, palpitations and leg swelling.  Neurological: Negative for dizziness, seizures, syncope, weakness, light-headedness and headaches.       Objective:   Physical Exam  Constitutional: She appears well-developed and well-nourished.  HENT:  Patient some dark maroon-colored blood right inferior canal. This is mostly involving the lower portion of the ear canal. She does have tympanostomy tube which is barely visible surrounded by blood  Neck: Neck supple. No thyromegaly present.  Cardiovascular: Normal rate and regular rhythm.   Pulmonary/Chest: Effort normal and breath sounds normal. No respiratory distress. She has no wheezes. She has no rales.  Musculoskeletal: She exhibits  no edema.          Assessment & Plan:  #1 morbid obesity with multiple comorbidities. We'll produce support letter for consideration for lap band surgery. We do feel she would benefit based on her multiple comorbidities #2 chronic insomnia. Sleep hygiene discussed. Refill Ambien for 6 months #3 dried blood right ear canal. Recommend followup with  ENT if symptoms persist.

## 2013-07-12 NOTE — Progress Notes (Signed)
Pre visit review using our clinic review tool, if applicable. No additional management support is needed unless otherwise documented below in the visit note. 

## 2013-07-13 DIAGNOSIS — J310 Chronic rhinitis: Secondary | ICD-10-CM | POA: Diagnosis not present

## 2013-07-13 DIAGNOSIS — H6980 Other specified disorders of Eustachian tube, unspecified ear: Secondary | ICD-10-CM | POA: Diagnosis not present

## 2013-07-13 DIAGNOSIS — M159 Polyosteoarthritis, unspecified: Secondary | ICD-10-CM | POA: Diagnosis not present

## 2013-07-13 DIAGNOSIS — M069 Rheumatoid arthritis, unspecified: Secondary | ICD-10-CM | POA: Diagnosis not present

## 2013-07-13 DIAGNOSIS — M25579 Pain in unspecified ankle and joints of unspecified foot: Secondary | ICD-10-CM | POA: Diagnosis not present

## 2013-07-13 DIAGNOSIS — IMO0001 Reserved for inherently not codable concepts without codable children: Secondary | ICD-10-CM | POA: Diagnosis not present

## 2013-07-15 LAB — VITAMIN D 1,25 DIHYDROXY
Vitamin D 1, 25 (OH)2 Total: 62 pg/mL (ref 18–72)
Vitamin D2 1, 25 (OH)2: <8 pg/mL
Vitamin D3 1, 25 (OH)2: 62 pg/mL

## 2013-07-19 ENCOUNTER — Encounter: Payer: Self-pay | Admitting: Family Medicine

## 2013-07-28 ENCOUNTER — Encounter: Payer: BC Managed Care – PPO | Attending: General Surgery | Admitting: Dietician

## 2013-07-28 ENCOUNTER — Encounter: Payer: Self-pay | Admitting: Dietician

## 2013-07-28 VITALS — Ht 64.0 in | Wt 291.4 lb

## 2013-07-28 DIAGNOSIS — Z6841 Body Mass Index (BMI) 40.0 and over, adult: Secondary | ICD-10-CM | POA: Insufficient documentation

## 2013-07-28 DIAGNOSIS — Z713 Dietary counseling and surveillance: Secondary | ICD-10-CM | POA: Insufficient documentation

## 2013-07-28 NOTE — Progress Notes (Signed)
  Pre-Op Assessment Visit:  Pre-Operative LAGB Surgery  Medical Nutrition Therapy:  Appt start time: 1400   End time:  1430.  Patient was seen on 07/28/2013 for Pre-Operative LAGB Nutrition Assessment. Assessment and letter of approval faxed to [facility]ariatric [facility] Program coordinator on 07/28/2013.   Preferred Learning Style:  No preference indicated   Learning Readiness:  Ready  Handouts given during visit include:  Pre-Op Goals Bariatric Surgery Protein Shakes  Teaching Method Utilized:  Visual Auditory Hands on  Barriers to learning/adherence to lifestyle change: none  Demonstrated degree of understanding via:  Teach Back   Patient to call the [facility] to enroll in Pre-Op and Post-Op Nutrition Education when surgery date is scheduled.

## 2013-07-31 ENCOUNTER — Ambulatory Visit: Payer: BC Managed Care – PPO | Attending: Orthopedic Surgery | Admitting: Physical Therapy

## 2013-07-31 DIAGNOSIS — IMO0001 Reserved for inherently not codable concepts without codable children: Secondary | ICD-10-CM | POA: Diagnosis not present

## 2013-07-31 DIAGNOSIS — M25569 Pain in unspecified knee: Secondary | ICD-10-CM | POA: Insufficient documentation

## 2013-07-31 DIAGNOSIS — M25669 Stiffness of unspecified knee, not elsewhere classified: Secondary | ICD-10-CM | POA: Diagnosis not present

## 2013-07-31 DIAGNOSIS — Z96659 Presence of unspecified artificial knee joint: Secondary | ICD-10-CM | POA: Diagnosis not present

## 2013-07-31 DIAGNOSIS — M25559 Pain in unspecified hip: Secondary | ICD-10-CM | POA: Diagnosis not present

## 2013-07-31 DIAGNOSIS — Z9889 Other specified postprocedural states: Secondary | ICD-10-CM | POA: Insufficient documentation

## 2013-08-01 ENCOUNTER — Ambulatory Visit: Payer: BC Managed Care – PPO | Admitting: Physical Therapy

## 2013-08-01 DIAGNOSIS — IMO0001 Reserved for inherently not codable concepts without codable children: Secondary | ICD-10-CM | POA: Diagnosis not present

## 2013-08-02 ENCOUNTER — Ambulatory Visit (INDEPENDENT_AMBULATORY_CARE_PROVIDER_SITE_OTHER): Payer: BC Managed Care – PPO | Admitting: Cardiovascular Disease

## 2013-08-02 ENCOUNTER — Encounter: Payer: Self-pay | Admitting: Cardiovascular Disease

## 2013-08-02 VITALS — BP 132/84 | HR 70 | Ht 64.0 in | Wt 292.0 lb

## 2013-08-02 DIAGNOSIS — Z6841 Body Mass Index (BMI) 40.0 and over, adult: Secondary | ICD-10-CM

## 2013-08-02 NOTE — Progress Notes (Signed)
[name] Date of Birth  08-14-1955       [place] [address], [address], [place], [place]  [address]   [place], [place]  [address] [place]   Fax  [phone]     Fax [phone]  Problem List: 1. Morbid obesity 2. Hypertension 3. Hyperlipidemia  History of Present Illness:  [name] presents for pre-op evaluation prior to Lap-band.  She has occasional episodes of dyspnea and also has some palpitations. She was referred here for further evaluation.  She's had some occasional palpitations. These last for only one second. These occurred both with rest and with exertion. There is no association with eating or drinking. Clinically the stomach premature ventricular contractions.  She's also had some shortness of breath which she relates to her weight. She has fairly significant dyspnea with exertion that resolves quickly at rest.    Current Outpatient Prescriptions on File Prior to Visit  Medication Sig Dispense Refill  . acetaminophen (TYLENOL) 500 MG tablet Take 1,000 mg by mouth every 6 (six) hours as needed for pain.       Marland Kitchen aspirin EC 81 MG tablet Take 81 mg by mouth daily.      Marland Kitchen estradiol (ESTRACE) 1 MG tablet Take 1 mg by mouth daily.      Marland Kitchen etanercept (ENBREL) 50 MG/ML injection Inject 50 mg into the skin once a week.      . fluticasone (FLONASE) 50 MCG/ACT nasal spray Place 2 sprays into the nose daily as needed for allergies.       . folic acid (FOLVITE) 1 MG tablet Take 2 mg by mouth daily.       . hydrochlorothiazide (HYDRODIURIL) 25 MG tablet Take 25 mg by mouth daily.      Marland Kitchen lisinopril (PRINIVIL,ZESTRIL) 20 MG tablet Take 1 tablet (20 mg total) by mouth daily.  90 tablet  3  . methotrexate (50 MG/ML) 1 gm SOLR Inject 50 mg/m2 into the muscle once a week.      Marland Kitchen oxyCODONE-acetaminophen (PERCOCET/ROXICET) 5-325 MG per tablet Take 1 tablet by mouth every 4 (four) hours as needed for  pain.      . pramipexole (MIRAPEX) 0.125 MG tablet Take 3 tablets (0.375 mg total) by mouth at bedtime.  360 tablet  3  . predniSONE (DELTASONE) 20 MG tablet Take 1 tablet (20 mg total) by mouth daily with breakfast.  5 tablet  0  . sertraline (ZOLOFT) 50 MG tablet Take 1 tablet (50 mg total) by mouth daily.  90 tablet  3  . simvastatin (ZOCOR) 20 MG tablet Take 1 tablet (20 mg total) by mouth every evening.  90 tablet  3  . zolpidem (AMBIEN) 10 MG tablet Take 1 tablet (10 mg total) by mouth at bedtime as needed for sleep.  90 tablet  1   No current facility-administered medications on file prior to visit.    Allergies  Allergen Reactions  . Codeine Sulfate Other (See Comments)    GI upset  . Penicillins Rash    Past Medical History  Diagnosis Date  . ANGIOMA 10/03/2008    REMOVED FROM RIGHT LOWER LEG-BENIGN  . HYPERLIPIDEMIA 08/27/2008  . DEPRESSION 12/10/2009  . HYPERTENSION 08/27/2008  . GERD 08/27/2008  . SEBORRHEIC KERATOSIS 08/27/2008  . Arthritis     RA  . Bronchitis     SEASONAL  . Hyperlipemia   .  Fibromyalgia     Past Surgical History  Procedure Laterality Date  . Cesarean section  1980  . Cesarean section  1982  . Cholecystectomy  1997  . Abdominal hysterectomy  1995  . Foot surgery  2004, 2005, 2008  . Knee arthroscopy  2007, 2208, 2009  . Nasal sinus surgery  2013  . Joint replacement  10,12    lt total knee  . Breast ductal system excision  08/16/2011    Procedure: EXCISION DUCTAL SYSTEM BREAST;  Surgeon: Joyice Faster. Cornett, MD;  Location: [facility];  Service: General;  Laterality: Left;  left breast duct excision  . Open surgical repair of gluteal tendon  01/17/2012    Procedure: OPEN SURGICAL REPAIR OF GLUTEAL TENDON;  Surgeon: Mauri Pole, MD;  Location: [facility];  Service: Orthopedics;  Laterality: Right;    History  Smoking status  . Former Smoker -- 0.50 packs/day for 5 years  . Types: Cigarettes  . Quit date: 02/22/1990  Smokeless  tobacco  . Never Used    History  Alcohol Use No    Family History  Problem Relation Age of Onset  . Cancer Mother     breast  . Cancer Father     prostate  . Diabetes Other     grandparent  . Heart disease Other     parent  . Kidney disease Other     parent  . Cancer Other     parent  . Hypertension Other   . Hyperlipidemia Other     Reviw of Systems:  Reviewed in the HPI.  All other systems are negative.  Physical Exam: Blood pressure 132/84, pulse 70, height 5\' 4"  (1.626 m), weight 292 lb (132.45 kg). Wt Readings from Last 3 Encounters:  08/02/13 292 lb (132.45 kg)  07/28/13 291 lb 6.4 oz (132.178 kg)  07/12/13 290 lb (131.543 kg)     General: Well developed, well nourished, in no acute distress.  Head: Normocephalic, atraumatic, sclera non-icteric, mucus membranes are moist,   Neck: Supple. Carotids are 2 + without bruits. No JVD   Lungs: Clear   Heart: RR, normal S1S2  Abdomen: Soft, non-tender, morbid obesity..  Msk:  Strength and tone are normal   Extremities: No clubbing or cyanosis. No edema.  Distal pedal pulses are 2+ and equal    Neuro: CN II - XII intact.  Alert and oriented X 3.   Psych:  Normal   ECG: Jul 11, 2013:  NSR at 20.  Normal ECG  Assessment / Plan:

## 2013-08-02 NOTE — Assessment & Plan Note (Signed)
[name] today as part of a preoperative evaluation prior to lab band surgery. She has shortness breath which I think is due to her morbid obesity. She does not have any other signs or symptoms of congestive heart failure. Her resting EKG is normal. She has no PND or  orthopnea. She denies any leg edema. She denies any chest pain.  I Think that she is at low risk for upcoming lap band surgery. I'll see her on an as-needed basis.

## 2013-08-02 NOTE — Patient Instructions (Signed)
Your physician recommends that you continue on your current medications as directed. Please refer to the Current Medication list given to you today.  Your physician recommends that you schedule a follow-up appointment as needed  You are cleared for your upcoming surgery

## 2013-08-06 ENCOUNTER — Ambulatory Visit: Payer: BC Managed Care – PPO | Admitting: Physical Therapy

## 2013-08-06 DIAGNOSIS — IMO0001 Reserved for inherently not codable concepts without codable children: Secondary | ICD-10-CM | POA: Diagnosis not present

## 2013-08-08 ENCOUNTER — Ambulatory Visit: Payer: BC Managed Care – PPO | Admitting: Physical Therapy

## 2013-08-08 DIAGNOSIS — IMO0001 Reserved for inherently not codable concepts without codable children: Secondary | ICD-10-CM | POA: Diagnosis not present

## 2013-08-13 ENCOUNTER — Ambulatory Visit: Payer: BC Managed Care – PPO | Admitting: Physical Therapy

## 2013-08-13 DIAGNOSIS — IMO0001 Reserved for inherently not codable concepts without codable children: Secondary | ICD-10-CM | POA: Diagnosis not present

## 2013-08-15 DIAGNOSIS — Z96659 Presence of unspecified artificial knee joint: Secondary | ICD-10-CM | POA: Diagnosis not present

## 2013-08-20 ENCOUNTER — Encounter: Payer: BC Managed Care – PPO | Admitting: *Deleted

## 2013-08-21 ENCOUNTER — Encounter (INDEPENDENT_AMBULATORY_CARE_PROVIDER_SITE_OTHER): Payer: Self-pay | Admitting: General Surgery

## 2013-08-23 ENCOUNTER — Encounter: Payer: BC Managed Care – PPO | Admitting: Physical Therapy

## 2013-09-05 ENCOUNTER — Other Ambulatory Visit (INDEPENDENT_AMBULATORY_CARE_PROVIDER_SITE_OTHER): Payer: Self-pay

## 2013-09-05 ENCOUNTER — Ambulatory Visit (INDEPENDENT_AMBULATORY_CARE_PROVIDER_SITE_OTHER): Payer: BC Managed Care – PPO | Admitting: General Surgery

## 2013-09-05 ENCOUNTER — Encounter (INDEPENDENT_AMBULATORY_CARE_PROVIDER_SITE_OTHER): Payer: Self-pay | Admitting: General Surgery

## 2013-09-05 VITALS — BP 124/80 | HR 78 | Temp 97.8°F | Ht 64.0 in | Wt 295.0 lb

## 2013-09-05 DIAGNOSIS — Z6841 Body Mass Index (BMI) 40.0 and over, adult: Secondary | ICD-10-CM | POA: Diagnosis not present

## 2013-09-05 MED ORDER — ONDANSETRON HCL 4 MG PO TABS: 4.0000 mg | ORAL_TABLET | Freq: Three times a day (TID) | ORAL | Status: DC | PRN

## 2013-09-05 NOTE — Patient Instructions (Signed)
zofran was sent in electronically to pharmacy  Congratulations on starting your journey to a healthier life! Over the next few weeks you will be undergoing tests (x-rays and labs) and seeing specialists to help evaluate you for weight loss surgery.  These tests and consultations with a psychologist and nutritionist are needed to prepare you for the lifestyle changes that lie ahead and are often required by insurance companies to approve you for surgery.   Pathway to Surgery:  Over the next few weeks -->Lab work -->Radiology tests   - Chest x-ray - make sure your lungs are normal before surgery  - Upper GI - you drink barium and pictures are taken as it travels down your  esophagus and into your stomach - looks for reflux and a hiatal hernia which may  need to repaired at the same time as your weight loss surgery  - Abdominal Ultrasound - looks at your gallbladder and liver  - Mammogram - up to date mammogram if you are a female -->EKG  -->Sleep study - if you are felt to be at high risk for obstructive sleep apnea -->H. Pylori breath test ([employer]) - you surgeon may order this test to see if you have  a bacteria (H pylori) in your stomach which makes you at higher risk to develop a  ulcer or inflammation of your stomach -->Nutrition consultation -->Psychologist consultation -->Other specialist consults - your surgeon may determine that you need to see a  specialist like a cardiologist or pulmnologist depending on your health history -->Watch EMMI video about your planned weight loss surgery -->you can look at [email or web address] to learn more about weight loss surgery and  compare surgery outcomes -->you can look at our new website - [email or web address] - available mid-August 2015  Two weeks prior to surgery  Go on the extremely low carb liquid diet - this will decrease the size of your liver  which will make surgery safer - the nutritionist will go over this at a later date  Attend  preoperative appointment with your surgeon  Attend preoperative surgery class  One week prior to surgery  No aspirin products.  Tylenol is acceptable   24 hours prior to surgery  No alcoholic beverages  Report fever greater than 100.5 or excessive nasal drainage suggesting infection  Continue bariatric preop diet  Perform bowel prep if ordered  Do not eat or drink anything after midnight the night before surgery  Do not take any medications except those instructed by the anesthesiologist  Morning of surgery  Please arrive at the hospital at least 2 hours before your scheduled surgery time.  No makeup, fingernail polish or jewelry  Bring insurance cards with you  Bring your CPAP mask if you use this

## 2013-09-06 ENCOUNTER — Other Ambulatory Visit (INDEPENDENT_AMBULATORY_CARE_PROVIDER_SITE_OTHER): Payer: Self-pay | Admitting: General Surgery

## 2013-09-06 NOTE — Progress Notes (Signed)
Subjective:     Patient ID: [name], female   DOB: 10/15/1955, 58 y.o.   MRN: [id number]  HPI 58-year-old Caucasian female comes in to rediscuss weight-loss surgery. I initially met her a few months ago. At that time she was interested in laparoscopic adjustable gastric band placement; however, after doing additional research she believes that she would be best served by a sleeve gastrectomy. She states that with her age and her current mobility issues she thinks a sleeve gastrectomy would give her the best chance for sustained weight loss. She is here to talk about sleeve gastrectomy surgery. She denies any significant medical changes since she was last seen.  PMHx, PSHx, SOCHx, FAMHx, ALL reviewed and unchanged  Review of Systems A 12 point Review of systems was performed and all systems are unchanged from my initial H&P     Objective:   Physical Exam BP 124/80  Pulse 78  Temp(Src) 97.8 F (36.6 C)  Ht 5' 4" (1.626 m)  Wt 295 lb (133.811 kg)  BMI 50.61 kg/m2 deferred    Assessment:     Morbid obesity BMI 50.64 HTN  Hyperlipidemia  GERD  RA  Restless legs syndrome      Plan:     The patient meets weight loss surgery criteria. I think the patient would be an acceptable candidate for Laparoscopic vertical sleeve gastrectomy.   We discussed laparoscopic sleeve gastrectomy. We discussed the preoperative, operative and postoperative process. Using diagrams, I explained the surgery in detail including the performance of an EGD near the end of the surgery and an Upper GI swallow study on POD 1. We discussed the typical hospital course including a 2-3 day stay baring any complications.   The patient was given educational material. I quoted the patient that most patients can lose up to 50-70% of their excess weight. We did discuss the possibility of weight regain several years after the procedure.  The risks of infection, bleeding, pain, scarring, weight regain, too little or  too much weight loss, vitamin deficiencies and need for lifelong vitamin supplementation, hair loss, need for protein supplementation, leaks, stricture, reflux, food intolerance, gallstone formation, hernia, need for reoperation and conversion to roux Y gastric bypass, need for open surgery, injury to spleen or surrounding structures, DVT's, PE, and death again discussed with the patient and the patient expressed understanding and desires to proceed with laparoscopic vertical sleeve gastrectomy, possible open, intraoperative endoscopy. I did explain to the patient that with her current in his suppression for her rheumatoid arthritis her risk of infection and leak is higher than the patient he was not on those types of medications. In the interim we will try to contact her rheumatologist and see if there is any way we can hold some of her immunosuppression medication perioperatively  We discussed that before and after surgery that there would be an alteration in their diet. I explained that we have put them on a diet 2 weeks before surgery. I also explained that they would be on a liquid diet for 2 weeks after surgery. We discussed that they would have to avoid certain foods after surgery. We discussed the importance of physical activity as well as compliance with our dietary and supplement recommendations and routine follow-up.  The patient was given EMMI video access on sleeve gastrectomy.  We continue with her bariatric evaluation   M. , MD, [name] General, Bariatric, & Minimally Invasive Surgery [facility]        

## 2013-09-13 ENCOUNTER — Ambulatory Visit (INDEPENDENT_AMBULATORY_CARE_PROVIDER_SITE_OTHER): Payer: BC Managed Care – PPO | Admitting: Family Medicine

## 2013-09-13 ENCOUNTER — Encounter: Payer: Self-pay | Admitting: Family Medicine

## 2013-09-13 VITALS — BP 130/80 | HR 84

## 2013-09-13 DIAGNOSIS — R51 Headache: Secondary | ICD-10-CM | POA: Diagnosis not present

## 2013-09-13 DIAGNOSIS — R519 Headache, unspecified: Secondary | ICD-10-CM

## 2013-09-13 MED ORDER — LEVOFLOXACIN 750 MG PO TABS: 750.0000 mg | ORAL_TABLET | Freq: Every day | ORAL | Status: DC

## 2013-09-13 NOTE — Progress Notes (Signed)
   Subjective:    Patient ID: [name], female    DOB: 1956/02/21, 58 y.o.   MRN: [id number]  Headache  Associated symptoms include sinus pressure. Pertinent negatives include no coughing, dizziness or fever.   Patient seen with 2 week history of fairly constant headache. She's had some mild nausea without vomiting. No fevers or chills. Somewhat similar headache couple years ago and she had sphenoid sinusitis. She's had some mild nasal congestion but no purulent secretions. She's been taking Nasacort, Zyrtec, and Tylenol without much relief. Denies any visual changes. No sore throat. No cough. No exacerbating factors. She does note pain over her frontal sinus region bilaterally  Past Medical History  Diagnosis Date  . ANGIOMA 10/03/2008    REMOVED FROM RIGHT LOWER LEG-BENIGN  . HYPERLIPIDEMIA 08/27/2008  . DEPRESSION 12/10/2009  . HYPERTENSION 08/27/2008  . GERD 08/27/2008  . SEBORRHEIC KERATOSIS 08/27/2008  . Arthritis     RA  . Bronchitis     SEASONAL  . Hyperlipemia   . Fibromyalgia    Past Surgical History  Procedure Laterality Date  . Cesarean section  1980  . Cesarean section  1982  . Cholecystectomy  1997  . Abdominal hysterectomy  1995  . Foot surgery  2004, 2005, 2008  . Knee arthroscopy  2007, 2208, 2009  . Nasal sinus surgery  2013  . Joint replacement  10,12    lt total knee  . Breast ductal system excision  08/16/2011    Procedure: EXCISION DUCTAL SYSTEM BREAST;  Surgeon: Joyice Faster. Cornett, MD;  Location: [facility];  Service: General;  Laterality: Left;  left breast duct excision  . Open surgical repair of gluteal tendon  01/17/2012    Procedure: OPEN SURGICAL REPAIR OF GLUTEAL TENDON;  Surgeon: Mauri Pole, MD;  Location: [facility];  Service: Orthopedics;  Laterality: Right;    reports that she quit smoking about 23 years ago. Her smoking use included Cigarettes. She has a 2.5 pack-year smoking history. She has never used smokeless tobacco. She  reports that she does not drink alcohol or use illicit drugs. family history includes Cancer in her father, mother, and other; Diabetes in her other; Heart disease in her other; Hyperlipidemia in her other; Hypertension in her other; Kidney disease in her other. Allergies  Allergen Reactions  . Codeine   . Codeine Sulfate Other (See Comments)    GI upset  . Penicillin V   . Penicillins Rash      Review of Systems  Constitutional: Negative for fever and chills.  HENT: Positive for congestion and sinus pressure.   Respiratory: Negative for cough and shortness of breath.   Neurological: Positive for headaches. Negative for dizziness and syncope.       Objective:   Physical Exam  Constitutional: She appears well-developed and well-nourished. No distress.  HENT:  Right Ear: External ear normal.  Left Ear: External ear normal.  Mouth/Throat: Oropharynx is clear and moist.  Neck: Neck supple.  Cardiovascular: Normal rate and regular rhythm.   Pulmonary/Chest: Effort normal and breath sounds normal. No respiratory distress. She has no wheezes. She has no rales.  Lymphadenopathy:    She has no cervical adenopathy.          Assessment & Plan:  Bifrontal headache. Question recurrent sinusitis. Given her past history, start Levaquin 750 mg daily for 10 days. Consider limited CT sinuses if no better after treatment

## 2013-09-13 NOTE — Progress Notes (Signed)
Pre visit review using our clinic review tool, if applicable. No additional management support is needed unless otherwise documented below in the visit note. 

## 2013-09-13 NOTE — Patient Instructions (Signed)

## 2013-09-18 ENCOUNTER — Encounter (INDEPENDENT_AMBULATORY_CARE_PROVIDER_SITE_OTHER): Payer: Self-pay | Admitting: General Surgery

## 2013-09-20 ENCOUNTER — Ambulatory Visit (INDEPENDENT_AMBULATORY_CARE_PROVIDER_SITE_OTHER): Payer: BC Managed Care – PPO | Admitting: General Surgery

## 2013-10-01 ENCOUNTER — Encounter: Payer: BC Managed Care – PPO | Attending: General Surgery

## 2013-10-01 DIAGNOSIS — Z713 Dietary counseling and surveillance: Secondary | ICD-10-CM | POA: Insufficient documentation

## 2013-10-01 DIAGNOSIS — Z6841 Body Mass Index (BMI) 40.0 and over, adult: Secondary | ICD-10-CM | POA: Insufficient documentation

## 2013-10-04 NOTE — Progress Notes (Signed)
  Pre-Operative Nutrition Class:  Appt start time: 2542   End time:  1830.  Patient was seen on 10/01/2013 for Pre-Operative Bariatric Surgery Education at [facility].   Surgery date: 10/30/2013 Surgery type: gastric sleeve Start weight at [facility]: 291.5 lbs on 07/28/13 Weight today: 298 lbs  TANITA  BODY COMP RESULTS  10/01/13   BMI (kg/m^2) 51.2   Fat Mass (lbs) 142.5   Fat Free Mass (lbs) 155.5   Total Body Water (lbs) 114    Samples given per MNT protocol. Patient educated on appropriate usage:   Unjury protein powder (unflavored - qty 1)  Lot #: [id number]  Exp: 11/2014   Bariactiv Calcium Citrate (qty 1)  Lot #: [id number]  Exp: 07/2014   Celebrate Vitamins Multivitamin (qty 1)  Lot #: [id number]  Exp: 05/2014   Premier protein shake (chocolate - qty 1)  Lot #: [id number]  Exp: 05/2014   The following the learning objectives were met by the patient during this course:  Identify Pre-Op Dietary Goals and will begin 2 weeks pre-operatively  Identify appropriate sources of fluids and proteins   State protein recommendations and appropriate sources pre and post-operatively  Identify Post-Operative Dietary Goals and will follow for 2 weeks post-operatively  Identify appropriate multivitamin and calcium sources  Describe the need for physical activity post-operatively and will follow MD recommendations  State when to call healthcare provider regarding medication questions or post-operative complications  Handouts given during class include:  Pre-Op Bariatric Surgery Diet Handout  Protein Shake Handout  Post-Op Bariatric Surgery Nutrition Handout  BELT Program Information Flyer  Support Group Information Flyer  [facility] Bariatric Supplements Price List  Follow-Up Plan: Patient will follow-up at [facility] 2 weeks post operatively for diet advancement per MD.

## 2013-10-09 ENCOUNTER — Other Ambulatory Visit (INDEPENDENT_AMBULATORY_CARE_PROVIDER_SITE_OTHER): Payer: Self-pay | Admitting: General Surgery

## 2013-10-15 DIAGNOSIS — M159 Polyosteoarthritis, unspecified: Secondary | ICD-10-CM | POA: Diagnosis not present

## 2013-10-15 DIAGNOSIS — M069 Rheumatoid arthritis, unspecified: Secondary | ICD-10-CM | POA: Diagnosis not present

## 2013-10-15 DIAGNOSIS — IMO0001 Reserved for inherently not codable concepts without codable children: Secondary | ICD-10-CM | POA: Diagnosis not present

## 2013-10-16 ENCOUNTER — Telehealth (INDEPENDENT_AMBULATORY_CARE_PROVIDER_SITE_OTHER): Payer: Self-pay

## 2013-10-16 NOTE — Telephone Encounter (Signed)
Called and spoke to patient with pre op and post op appointments.  Patient given 10/17/13 @ 10:00 am pre-op and 11/07/13 @ 10:30 am post op

## 2013-10-17 ENCOUNTER — Ambulatory Visit (INDEPENDENT_AMBULATORY_CARE_PROVIDER_SITE_OTHER): Payer: BC Managed Care – PPO | Admitting: General Surgery

## 2013-10-17 ENCOUNTER — Encounter (INDEPENDENT_AMBULATORY_CARE_PROVIDER_SITE_OTHER): Payer: Self-pay | Admitting: General Surgery

## 2013-10-17 VITALS — BP 136/76 | HR 86 | Temp 98.0°F | Resp 18 | Ht 64.0 in | Wt 284.0 lb

## 2013-10-17 DIAGNOSIS — Z6841 Body Mass Index (BMI) 40.0 and over, adult: Secondary | ICD-10-CM

## 2013-10-17 MED ORDER — OXYCODONE HCL 5 MG/5ML PO SOLN: 5.0000 mg | ORAL | Status: DC | PRN

## 2013-10-17 NOTE — Patient Instructions (Signed)
Read over surgical consent and bring back to office.  Call me if you have any questions regarding surgery or consent Stop methotrexate and embrel per Rheumatologist direction Continue with preop diet

## 2013-10-19 ENCOUNTER — Telehealth: Payer: Self-pay | Admitting: Family Medicine

## 2013-10-19 ENCOUNTER — Encounter: Payer: Self-pay | Admitting: Family Medicine

## 2013-10-19 ENCOUNTER — Ambulatory Visit (INDEPENDENT_AMBULATORY_CARE_PROVIDER_SITE_OTHER): Payer: BC Managed Care – PPO | Admitting: Family Medicine

## 2013-10-19 VITALS — BP 132/78 | HR 78 | Wt 295.0 lb

## 2013-10-19 DIAGNOSIS — N3000 Acute cystitis without hematuria: Secondary | ICD-10-CM

## 2013-10-19 DIAGNOSIS — R319 Hematuria, unspecified: Secondary | ICD-10-CM

## 2013-10-19 LAB — POCT URINALYSIS DIPSTICK
Bilirubin, UA: NEGATIVE
Glucose, UA: NEGATIVE
Ketones, UA: NEGATIVE
Nitrite, UA: POSITIVE
Protein, UA: NEGATIVE
Spec Grav, UA: 1.015
Urobilinogen, UA: 0.2
pH, UA: 5

## 2013-10-19 MED ORDER — CIPROFLOXACIN HCL 500 MG PO TABS: 500.0000 mg | ORAL_TABLET | Freq: Two times a day (BID) | ORAL | Status: DC

## 2013-10-19 NOTE — Telephone Encounter (Signed)
Pt said she a UTI and would like to know if Dr Elease Hashimoto would call her in something.Marland Kitchen  [place]

## 2013-10-19 NOTE — Patient Instructions (Signed)

## 2013-10-19 NOTE — Progress Notes (Signed)
Pre visit review using our clinic review tool, if applicable. No additional management support is needed unless otherwise documented below in the visit note. 

## 2013-10-19 NOTE — Telephone Encounter (Signed)
Pt is going to come in for a follow up visit.

## 2013-10-19 NOTE — Progress Notes (Signed)
   Subjective:    Patient ID: [name], female    DOB: [birth year], 58 y.o.   MRN: [id number]  Dysuria  Associated symptoms include frequency. Pertinent negatives include no chills, nausea or vomiting.    Acute visit. Dysuria for the past 2 days. Suprapubic pressure and urine frequency and mild burning with urination. No fever or chills. No nausea or vomiting. She has been scheduled for gastric bypass surgery in October. She denies any flank pain. No history of frequent UTIs in the past. Penicillin allergic. Drinking lots of fluids  Past Medical History  Diagnosis Date  . ANGIOMA 10/03/2008    REMOVED FROM RIGHT LOWER LEG-BENIGN  . HYPERLIPIDEMIA 08/27/2008  . DEPRESSION 12/10/2009  . HYPERTENSION 08/27/2008  . GERD 08/27/2008  . SEBORRHEIC KERATOSIS 08/27/2008  . Arthritis     RA  . Bronchitis     SEASONAL  . Hyperlipemia   . Fibromyalgia    Past Surgical History  Procedure Laterality Date  . Cesarean section  1980  . Cesarean section  1982  . Cholecystectomy  1997  . Abdominal hysterectomy  1995  . Foot surgery  2004, 2005, 2008  . Knee arthroscopy  2007, 2208, 2009  . Nasal sinus surgery  2013  . Joint replacement  10,12    lt total knee  . Breast ductal system excision  08/16/2011    Procedure: EXCISION DUCTAL SYSTEM BREAST;  Surgeon: Joyice Faster. Cornett, MD;  Location: [facility];  Service: General;  Laterality: Left;  left breast duct excision  . Open surgical repair of gluteal tendon  01/17/2012    Procedure: OPEN SURGICAL REPAIR OF GLUTEAL TENDON;  Surgeon: Mauri Pole, MD;  Location: [facility];  Service: Orthopedics;  Laterality: Right;    reports that she quit smoking about 23 years ago. Her smoking use included Cigarettes. She has a 2.5 pack-year smoking history. She has never used smokeless tobacco. She reports that she does not drink alcohol or use illicit drugs. family history includes Cancer in her father, mother, and other; Diabetes in her other;  Heart disease in her other; Hyperlipidemia in her other; Hypertension in her other; Kidney disease in her other. Allergies  Allergen Reactions  . Codeine   . Codeine Sulfate Other (See Comments)    GI upset  . Penicillin V   . Penicillins Rash     Review of Systems  Constitutional: Negative for fever, chills and appetite change.  Gastrointestinal: Negative for nausea, vomiting, abdominal pain, diarrhea and constipation.  Genitourinary: Positive for dysuria and frequency.  Musculoskeletal: Negative for back pain.  Neurological: Negative for dizziness.       Objective:   Physical Exam  Constitutional: She appears well-developed and well-nourished.  HENT:  Head: Normocephalic and atraumatic.  Cardiovascular: Normal rate, regular rhythm and normal heart sounds.   Pulmonary/Chest: Breath sounds normal.          Assessment & Plan:  Probable uncomplicated cystitis. Urine culture sent. Cipro 500 mg twice a day for 5 days

## 2013-10-19 NOTE — Progress Notes (Addendum)
Patient ID: [name], female   DOB: Jun 09, 1955, 58 y.o.   MRN: [id number]  Chief Complaint  Patient presents with  . Bariatric Follow Up    sleeve sx 10/30/13    HPI [name] is a 58 y.o. female.   HPI 58 year old morbidly obese Caucasian female comes in today for her preoperative appointment. She has been approved for laparoscopic sleeve gastrectomy with possible hiatal hernia repair. She is currently scheduled to undergo surgery on September 8. She denies any changes since she was last seen. I last saw her in mid July. I have spoken with cerumen collect his since then. She also states that she recently saw him in the past week. They're going to stop the methotrexate and Enbrel one week prior to surgery. She continues to take 5mg  prednisone daily. She has lost about 11 pounds since I last saw her a few weeks ago. Past Medical History  Diagnosis Date  . ANGIOMA 10/03/2008    REMOVED FROM RIGHT LOWER LEG-BENIGN  . HYPERLIPIDEMIA 08/27/2008  . DEPRESSION 12/10/2009  . HYPERTENSION 08/27/2008  . GERD 08/27/2008  . SEBORRHEIC KERATOSIS 08/27/2008  . Arthritis     RA  . Bronchitis     SEASONAL  . Hyperlipemia   . Fibromyalgia     Past Surgical History  Procedure Laterality Date  . Cesarean section  1980  . Cesarean section  1982  . Cholecystectomy  1997  . Abdominal hysterectomy  1995  . Foot surgery  2004, 2005, 2008  . Knee arthroscopy  2007, 2208, 2009  . Nasal sinus surgery  2013  . Joint replacement  10,12    lt total knee  . Breast ductal system excision  08/16/2011    Procedure: EXCISION DUCTAL SYSTEM BREAST;  Surgeon: Joyice Faster. Cornett, MD;  Location: [place];  Service: General;  Laterality: Left;  left breast duct excision  . Open surgical repair of gluteal tendon  01/17/2012    Procedure: OPEN SURGICAL REPAIR OF GLUTEAL TENDON;  Surgeon: Mauri Pole, MD;  Location: [facility];  Service: Orthopedics;  Laterality: Right;    Family History  Problem  Relation Age of Onset  . Cancer Mother     breast  . Cancer Father     prostate  . Diabetes Other     grandparent  . Heart disease Other     parent  . Kidney disease Other     parent  . Cancer Other     parent  . Hypertension Other   . Hyperlipidemia Other     Social History History  Substance Use Topics  . Smoking status: Former Smoker -- 0.50 packs/day for 5 years    Types: Cigarettes    Quit date: 02/22/1990  . Smokeless tobacco: Never Used  . Alcohol Use: No    Allergies  Allergen Reactions  . Codeine   . Codeine Sulfate Other (See Comments)    GI upset  . Penicillin V   . Penicillins Rash    Current Outpatient Prescriptions  Medication Sig Dispense Refill  . acetaminophen (TYLENOL) 500 MG tablet Take 1,000 mg by mouth every 6 (six) hours as needed for pain.       Marland Kitchen aspirin EC 81 MG tablet Take 81 mg by mouth daily.      . ciprofloxacin (CIPRO) 500 MG tablet Take 1 tablet (500 mg total) by mouth 2 (two) times daily.  10 tablet  0  . estradiol (ESTRACE) 1 MG tablet  Take 1 mg by mouth daily.      Marland Kitchen etanercept (ENBREL) 50 MG/ML injection Inject 50 mg into the skin once a week.      . fluticasone (FLONASE) 50 MCG/ACT nasal spray Place 2 sprays into the nose daily as needed for allergies.       . folic acid (FOLVITE) 1 MG tablet Take 2 mg by mouth daily.       . hydrochlorothiazide (HYDRODIURIL) 25 MG tablet Take 25 mg by mouth daily.      . methotrexate (50 MG/ML) 1 gm SOLR Inject 50 mg/m2 into the muscle once a week.      Marland Kitchen omeprazole (PRILOSEC) 20 MG capsule       . oxyCODONE (ROXICODONE) 5 MG/5ML solution Take 5-10 mLs (5-10 mg total) by mouth every 4 (four) hours as needed for severe pain.  200 mL  0  . oxyCODONE-acetaminophen (PERCOCET/ROXICET) 5-325 MG per tablet Take 1 tablet by mouth every 4 (four) hours as needed for pain.       No current facility-administered medications for this visit.    Review of Systems Review of Systems  Constitutional: Negative  for fever, activity change, appetite change and unexpected weight change.  HENT: Negative for nosebleeds and trouble swallowing.   Eyes: Negative for photophobia and visual disturbance.  Respiratory: Negative for chest tightness and shortness of breath.   Cardiovascular: Negative for chest pain and leg swelling.       Denies CP, SOB, orthopnea, PND, DOE  Gastrointestinal: Negative for nausea, vomiting, abdominal pain and diarrhea.       + reflux  Genitourinary: Negative for dysuria and difficulty urinating.  Musculoskeletal: Negative for arthralgias.       +RA on methotrexate, Enbrel, and prednisone. Has been on prednisone for many years.  Skin: Negative for pallor and rash.  Neurological: Negative for dizziness, seizures, facial asymmetry and numbness.       Denies TIA and amaurosis fugax   Hematological: Negative for adenopathy. Does not bruise/bleed easily.  Psychiatric/Behavioral: Negative for behavioral problems and agitation.    Blood pressure 136/76, pulse 86, temperature 98 F (36.7 C), resp. rate 18, height 5\' 4"  (1.626 m), weight 284 lb (128.822 kg).  Physical Exam Physical Exam  Vitals reviewed. Constitutional: She is oriented to person, place, and time. She appears well-developed and well-nourished. No distress.  Morbidly obese  HENT:  Head: Normocephalic and atraumatic.  Right Ear: External ear normal.  Left Ear: External ear normal.  Eyes: Conjunctivae are normal. No scleral icterus.  Neck: Normal range of motion. Neck supple. No tracheal deviation present. No thyromegaly present.  Cardiovascular: Normal rate and normal heart sounds.   Pulmonary/Chest: Effort normal and breath sounds normal. No stridor. No respiratory distress. She has no wheezes.  Abdominal: Soft. She exhibits no distension. There is no tenderness. There is no rebound and no guarding.    Musculoskeletal: She exhibits no edema and no tenderness.  Lymphadenopathy:    She has no cervical  adenopathy.  Neurological: She is alert and oriented to person, place, and time. She exhibits normal muscle tone.  Skin: Skin is warm and dry. No rash noted. She is not diaphoretic. No erythema.  Psychiatric: She has a normal mood and affect. Her behavior is normal. Judgment and thought content normal.    Data Reviewed My office notes Cardiology clearance letter 6/11 Dr Cathie Olden Labs April 2015- ok - hgb 11.6 UGI - +hiatal hernia CXR - normal  Assessment    Morbid  obesity BMI 48.75 Gastroesophageal reflux disease Hypertension Hypothyroidism Rheumatoid arthritis on immunosuppression History of hyperlipidemia     Plan    We reviewed her workup today. We discussed the findings of a hiatal hernia. I discussed that during surgery we would test for a clinically significant hiatal hernia. Found to have a significant defect I recommended proceeding with repair at the time of surgery. We discussed that would involve dissecting out the diaphragm muscles and placing several stitches between the diaphragm muscles.  Otherwise her workup was not that revealing. We'll rediscuss the fact that she was at a higher risk for perioperative infection, leak because of her immunosuppression. She understands this. Her husband was present during this consultation. Fortunately we will be able to hold her Enbrel and methotrexate however it was strongly encouraged by her rheumatologist to continue her prednisone 5mg  daily which we will do.   She was given her postoperative pain medicine prescription today. We discussed the importance of the preoperative diet. I congratulated her on her weight loss today. I advised her to contact the office should she have any additional questions over the next week or so  Note: This dictation was prepared with Dragon/digital dictation along with [employer]. Any transcriptional errors that result from this process are unintentional.  [name], MD, [name] General,  Bariatric, & Minimally Invasive Surgery [facility], [place]         [facility] M 10/19/2013, 1:01 PM

## 2013-10-22 ENCOUNTER — Encounter (HOSPITAL_COMMUNITY): Payer: Self-pay | Admitting: Pharmacy Technician

## 2013-10-22 LAB — URINE CULTURE: Colony Count: >=100000

## 2013-10-24 ENCOUNTER — Encounter (HOSPITAL_COMMUNITY): Payer: Self-pay

## 2013-10-24 ENCOUNTER — Encounter (HOSPITAL_COMMUNITY)
Admission: RE | Admit: 2013-10-24 | Discharge: 2013-10-24 | Disposition: A | Discharge status: Home / Self Care | Payer: BC Managed Care – PPO | Source: Ambulatory Visit | Attending: General Surgery | Admitting: General Surgery

## 2013-10-24 DIAGNOSIS — Z6841 Body Mass Index (BMI) 40.0 and over, adult: Secondary | ICD-10-CM | POA: Insufficient documentation

## 2013-10-24 DIAGNOSIS — Z01818 Encounter for other preprocedural examination: Secondary | ICD-10-CM | POA: Diagnosis present

## 2013-10-24 HISTORY — DX: Nausea with vomiting, unspecified: R11.2

## 2013-10-24 HISTORY — DX: Personal history of other malignant neoplasm of skin: Z85.828

## 2013-10-24 HISTORY — DX: Other complications of anesthesia, initial encounter: T88.59XA

## 2013-10-24 HISTORY — DX: Nausea with vomiting, unspecified: Z98.890

## 2013-10-24 HISTORY — DX: Malignant (primary) neoplasm, unspecified: C80.1

## 2013-10-24 HISTORY — DX: Adverse effect of unspecified anesthetic, initial encounter: T41.45XA

## 2013-10-24 LAB — COMPREHENSIVE METABOLIC PANEL
ALT: 22 U/L (ref 0–35)
AST: 21 U/L (ref 0–37)
Albumin: 3.5 g/dL (ref 3.5–5.2)
Alkaline Phosphatase: 61 U/L (ref 39–117)
Anion gap: 12 (ref 5–15)
BUN: 20 mg/dL (ref 6–23)
CO2: 25 mEq/L (ref 19–32)
Calcium: 9.4 mg/dL (ref 8.4–10.5)
Chloride: 102 mEq/L (ref 96–112)
Creatinine, Ser: 0.6 mg/dL (ref 0.50–1.10)
GFR calc Af Amer: >90 mL/min (ref >90)
GFR calc non Af Amer: >90 mL/min (ref >90)
Glucose, Bld: 79 mg/dL (ref 70–99)
Potassium: 4 mEq/L (ref 3.7–5.3)
Sodium: 139 mEq/L (ref 137–147)
Total Bilirubin: 0.4 mg/dL (ref 0.3–1.2)
Total Protein: 6.7 g/dL (ref 6.0–8.3)

## 2013-10-24 LAB — CBC WITH DIFFERENTIAL/PLATELET
Basophils Absolute: 0 10*3/uL (ref 0.0–0.1)
Basophils Relative: 1 % (ref 0–1)
Eosinophils Absolute: 0.3 10*3/uL (ref 0.0–0.7)
Eosinophils Relative: 3 % (ref 0–5)
HCT: 37.5 % (ref 36.0–46.0)
Hemoglobin: 12 g/dL (ref 12.0–15.0)
Lymphocytes Relative: 33 % (ref 12–46)
Lymphs Abs: 2.5 10*3/uL (ref 0.7–4.0)
MCH: 27.5 pg (ref 26.0–34.0)
MCHC: 32 g/dL (ref 30.0–36.0)
MCV: 85.8 fL (ref 78.0–100.0)
Monocytes Absolute: 0.7 10*3/uL (ref 0.1–1.0)
Monocytes Relative: 10 % (ref 3–12)
Neutro Abs: 4 10*3/uL (ref 1.7–7.7)
Neutrophils Relative %: 53 % (ref 43–77)
Platelets: 358 10*3/uL (ref 150–400)
RBC: 4.37 MIL/uL (ref 3.87–5.11)
RDW: 17.4 % — ABNORMAL HIGH (ref 11.5–15.5)
WBC: 7.5 10*3/uL (ref 4.0–10.5)

## 2013-10-24 NOTE — Patient Instructions (Addendum)
[name]  10/24/2013                           YOUR PROCEDURE IS SCHEDULED ON: 10/30/13               ENTER THRU [place] MAIN HOSPITAL ENTRANCE AND                            FOLLOW  SIGNS TO SHORT STAY CENTER                 ARRIVE AT SHORT STAY AT: 5:15 AM               CALL THIS NUMBER IF ANY PROBLEMS THE DAY OF SURGERY :               [phone]                                REMEMBER:   Do not eat food or drink liquids AFTER MIDNIGHT              NO ESTROGEN / PROGESTERONE 4 WKS PREOP             STOP ASPIRIN / HERBS 7 DAYS PREOP                  Take these medicines the morning of surgery with               A SIPS OF WATER :   PRILOSEC / PREDNISONE / SERTRALINE / FLONASE NASAL SPRAY      Do not wear jewelry, make-up   Do not wear lotions, powders, or perfumes.   Do not shave legs or underarms 12 hrs. before surgery (men may shave face)  Do not bring valuables to the hospital.  Contacts, dentures or bridgework may not be worn into surgery.  Leave suitcase in the car. After surgery it may be brought to your room.  For patients admitted to the hospital more than one night, checkout time is            11:00 AM                                                        ________________________________________________________________________                                                 [place]  Before surgery, you can play an important role.  Because skin is not sterile, your skin needs to be as free of germs as possible.  You can reduce the number of germs on your skin by washing with CHG (chlorahexidine gluconate) soap before surgery.  CHG is an antiseptic cleaner which kills germs and bonds with the skin to continue killing germs even after washing. Please DO NOT use if you have an allergy to CHG or antibacterial soaps.  If your skin becomes reddened/irritated stop using the CHG and inform your nurse when you arrive at Short  Stay. Do not shave (including legs and underarms) for at least 48 hours prior to the first CHG shower.  You may shave your face. Please follow these instructions carefully:   1.  Shower with CHG Soap the night before surgery and the  morning of Surgery.   2.  If you choose to wash your hair, wash your hair first as usual with your  normal  Shampoo.   3.  After you shampoo, rinse your hair and body thoroughly to remove the  shampoo.                                         4.  Use CHG as you would any other liquid soap.  You can apply chg directly  to the skin and wash . Gently wash with scrungie or clean wascloth    5.  Apply the CHG Soap to your body ONLY FROM THE NECK DOWN.   Do not use on open                           Wound or open sores. Avoid contact with eyes, ears mouth and genitals (private parts).                        Genitals (private parts) with your normal soap.              6.  Wash thoroughly, paying special attention to the area where your surgery  will be performed.   7.  Thoroughly rinse your body with warm water from the neck down.   8.  DO NOT shower/wash with your normal soap after using and rinsing off  the CHG Soap .                9.  Pat yourself dry with a clean towel.             10.  Wear clean pajamas.             11.  Place clean sheets on your bed the night of your first shower and do not  sleep with pets.  Day of Surgery : Do not apply any lotions/deodorants the morning of surgery.  Please wear clean clothes to the hospital/surgery center.  FAILURE TO FOLLOW THESE INSTRUCTIONS MAY RESULT IN THE CANCELLATION OF YOUR SURGERY    PATIENT SIGNATURE_________________________________  ______________________________________________________________________

## 2013-10-29 NOTE — Anesthesia Preprocedure Evaluation (Addendum)
Anesthesia Evaluation  Patient identified by MRN, date of birth, ID band Patient awake    Reviewed: Allergy & Precautions, H&P , NPO status , Patient's Chart, lab work & pertinent test results  History of Anesthesia Complications (+) PONV and history of anesthetic complications  Airway Mallampati: II TM Distance: >3 FB Neck ROM: Full    Dental  (+) Partial Lower   Pulmonary neg pneumonia -, former smoker,  breath sounds clear to auscultation  Pulmonary exam normal       Cardiovascular hypertension, Pt. on medications Rhythm:Regular Rate:Normal     Neuro/Psych PSYCHIATRIC DISORDERS Anxiety Depression  Neuromuscular disease    GI/Hepatic Neg liver ROS, GERD-  Medicated and Controlled,  Endo/Other  Morbid obesity  Renal/GU negative Renal ROS  negative genitourinary   Musculoskeletal  (+) Arthritis -, Rheumatoid disorders and on steriods ,  Fibromyalgia -, narcotic dependent  Abdominal (+) + obese,  Abdomen: soft.    Peds negative pediatric ROS (+)  Hematology  (+) anemia ,   Anesthesia Other Findings Prior anesthetic 11/13 under general: grade 1 view with MAC 4 blade   Reproductive/Obstetrics negative OB ROS                        Anesthesia Physical Anesthesia Plan  ASA: III  Anesthesia Plan: General   Post-op Pain Management:    Induction: Intravenous  Airway Management Planned: Oral ETT  Additional Equipment:   Intra-op Plan:   Post-operative Plan:   Informed Consent: I have reviewed the patients History and Physical, chart, labs and discussed the procedure including the risks, benefits and alternatives for the proposed anesthesia with the patient or authorized representative who has indicated his/her understanding and acceptance.   Dental advisory given  Plan Discussed with: CRNA  Anesthesia Plan Comments:         Anesthesia Quick Evaluation

## 2013-10-30 ENCOUNTER — Inpatient Hospital Stay (HOSPITAL_COMMUNITY)
Admission: RE | Admit: 2013-10-30 | Discharge: 2013-11-01 | DRG: 621 | Disposition: A | Discharge status: Home / Self Care | Payer: BC Managed Care – PPO | Source: Ambulatory Visit | Attending: General Surgery | Admitting: General Surgery

## 2013-10-30 ENCOUNTER — Encounter (HOSPITAL_COMMUNITY): Admission: RE | Disposition: A | Discharge status: Home / Self Care | Payer: Self-pay | Source: Ambulatory Visit

## 2013-10-30 ENCOUNTER — Encounter (HOSPITAL_COMMUNITY): Payer: Self-pay | Admitting: *Deleted

## 2013-10-30 ENCOUNTER — Inpatient Hospital Stay (HOSPITAL_COMMUNITY): Payer: BC Managed Care – PPO | Admitting: Anesthesiology

## 2013-10-30 ENCOUNTER — Encounter (HOSPITAL_COMMUNITY): Payer: BC Managed Care – PPO | Admitting: Anesthesiology

## 2013-10-30 DIAGNOSIS — Z885 Allergy status to narcotic agent status: Secondary | ICD-10-CM | POA: Diagnosis not present

## 2013-10-30 DIAGNOSIS — Z8249 Family history of ischemic heart disease and other diseases of the circulatory system: Secondary | ICD-10-CM

## 2013-10-30 DIAGNOSIS — IMO0001 Reserved for inherently not codable concepts without codable children: Secondary | ICD-10-CM | POA: Diagnosis not present

## 2013-10-30 DIAGNOSIS — E785 Hyperlipidemia, unspecified: Secondary | ICD-10-CM | POA: Diagnosis present

## 2013-10-30 DIAGNOSIS — Z833 Family history of diabetes mellitus: Secondary | ICD-10-CM | POA: Diagnosis not present

## 2013-10-30 DIAGNOSIS — Z803 Family history of malignant neoplasm of breast: Secondary | ICD-10-CM | POA: Diagnosis not present

## 2013-10-30 DIAGNOSIS — M069 Rheumatoid arthritis, unspecified: Secondary | ICD-10-CM | POA: Diagnosis present

## 2013-10-30 DIAGNOSIS — R110 Nausea: Secondary | ICD-10-CM | POA: Diagnosis not present

## 2013-10-30 DIAGNOSIS — F3289 Other specified depressive episodes: Secondary | ICD-10-CM | POA: Diagnosis present

## 2013-10-30 DIAGNOSIS — Z8042 Family history of malignant neoplasm of prostate: Secondary | ICD-10-CM | POA: Diagnosis not present

## 2013-10-30 DIAGNOSIS — I10 Essential (primary) hypertension: Secondary | ICD-10-CM | POA: Diagnosis present

## 2013-10-30 DIAGNOSIS — Z7982 Long term (current) use of aspirin: Secondary | ICD-10-CM

## 2013-10-30 DIAGNOSIS — F329 Major depressive disorder, single episode, unspecified: Secondary | ICD-10-CM | POA: Diagnosis present

## 2013-10-30 DIAGNOSIS — Z9884 Bariatric surgery status: Secondary | ICD-10-CM

## 2013-10-30 DIAGNOSIS — Z79899 Other long term (current) drug therapy: Secondary | ICD-10-CM

## 2013-10-30 DIAGNOSIS — E039 Hypothyroidism, unspecified: Secondary | ICD-10-CM | POA: Diagnosis present

## 2013-10-30 DIAGNOSIS — M1990 Unspecified osteoarthritis, unspecified site: Secondary | ICD-10-CM | POA: Diagnosis not present

## 2013-10-30 DIAGNOSIS — K219 Gastro-esophageal reflux disease without esophagitis: Secondary | ICD-10-CM | POA: Diagnosis not present

## 2013-10-30 DIAGNOSIS — K449 Diaphragmatic hernia without obstruction or gangrene: Secondary | ICD-10-CM | POA: Diagnosis present

## 2013-10-30 DIAGNOSIS — Z8349 Family history of other endocrine, nutritional and metabolic diseases: Secondary | ICD-10-CM | POA: Diagnosis not present

## 2013-10-30 DIAGNOSIS — E7800 Pure hypercholesterolemia, unspecified: Secondary | ICD-10-CM | POA: Diagnosis present

## 2013-10-30 DIAGNOSIS — Z880 Allergy status to penicillin: Secondary | ICD-10-CM

## 2013-10-30 DIAGNOSIS — Z01812 Encounter for preprocedural laboratory examination: Secondary | ICD-10-CM | POA: Diagnosis not present

## 2013-10-30 DIAGNOSIS — Z87891 Personal history of nicotine dependence: Secondary | ICD-10-CM | POA: Diagnosis not present

## 2013-10-30 DIAGNOSIS — Z6841 Body Mass Index (BMI) 40.0 and over, adult: Secondary | ICD-10-CM | POA: Diagnosis not present

## 2013-10-30 DIAGNOSIS — Z841 Family history of disorders of kidney and ureter: Secondary | ICD-10-CM | POA: Diagnosis not present

## 2013-10-30 HISTORY — PX: LAPAROSCOPIC GASTRIC SLEEVE RESECTION: SHX5895

## 2013-10-30 LAB — HEMOGLOBIN AND HEMATOCRIT, BLOOD
HCT: 38.8 % (ref 36.0–46.0)
Hemoglobin: 12.7 g/dL (ref 12.0–15.0)

## 2013-10-30 SURGERY — GASTRECTOMY, SLEEVE, LAPAROSCOPIC
Anesthesia: General | Site: Abdomen

## 2013-10-30 MED ORDER — FENTANYL CITRATE 0.05 MG/ML IJ SOLN
25.0000 ug | INTRAMUSCULAR | Status: DC | PRN
  Administered 2013-10-30 (×4): 25 ug via INTRAVENOUS

## 2013-10-30 MED ORDER — PROMETHAZINE HCL 25 MG/ML IJ SOLN
12.5000 mg | Freq: Four times a day (QID) | INTRAMUSCULAR | Status: DC | PRN
  Administered 2013-10-30 – 2013-10-31 (×3): 12.5 mg via INTRAVENOUS
  Filled 2013-10-30 (×3): qty 1

## 2013-10-30 MED ORDER — HEPARIN SODIUM (PORCINE) 5000 UNIT/ML IJ SOLN
5000.0000 [IU] | INTRAMUSCULAR | Status: AC
  Administered 2013-10-30: 5000 [IU] via SUBCUTANEOUS
  Filled 2013-10-30: qty 1

## 2013-10-30 MED ORDER — LIDOCAINE HCL (CARDIAC) 20 MG/ML IV SOLN
INTRAVENOUS | Status: DC | PRN
  Administered 2013-10-30: 100 mg via INTRAVENOUS

## 2013-10-30 MED ORDER — MIDAZOLAM HCL 2 MG/2ML IJ SOLN
INTRAMUSCULAR | Status: AC
  Filled 2013-10-30: qty 2

## 2013-10-30 MED ORDER — CHLORHEXIDINE GLUCONATE 4 % EX LIQD: 60.0000 mL | Freq: Once | CUTANEOUS | Status: DC

## 2013-10-30 MED ORDER — GLYCOPYRROLATE 0.2 MG/ML IJ SOLN
INTRAMUSCULAR | Status: AC
  Filled 2013-10-30: qty 3

## 2013-10-30 MED ORDER — ONDANSETRON HCL 4 MG/2ML IJ SOLN
INTRAMUSCULAR | Status: AC
  Filled 2013-10-30: qty 2

## 2013-10-30 MED ORDER — NEOSTIGMINE METHYLSULFATE 10 MG/10ML IV SOLN
INTRAVENOUS | Status: AC
  Filled 2013-10-30: qty 1

## 2013-10-30 MED ORDER — BUPIVACAINE-EPINEPHRINE 0.25% -1:200000 IJ SOLN
INTRAMUSCULAR | Status: DC | PRN
  Administered 2013-10-30: 28 mL

## 2013-10-30 MED ORDER — UNJURY VANILLA POWDER
2.0000 [oz_av] | Freq: Four times a day (QID) | ORAL | Status: DC
  Administered 2013-11-01: 2 [oz_av] via ORAL

## 2013-10-30 MED ORDER — PROPOFOL 10 MG/ML IV BOLUS
INTRAVENOUS | Status: AC
  Filled 2013-10-30: qty 20

## 2013-10-30 MED ORDER — LIDOCAINE HCL (CARDIAC) 20 MG/ML IV SOLN
INTRAVENOUS | Status: AC
  Filled 2013-10-30: qty 5

## 2013-10-30 MED ORDER — ACETAMINOPHEN 10 MG/ML IV SOLN
1000.0000 mg | Freq: Once | INTRAVENOUS | Status: AC
  Administered 2013-10-30: 1000 mg via INTRAVENOUS
  Filled 2013-10-30: qty 100

## 2013-10-30 MED ORDER — UNJURY CHICKEN SOUP POWDER: 2.0000 [oz_av] | Freq: Four times a day (QID) | ORAL | Status: DC

## 2013-10-30 MED ORDER — FENTANYL CITRATE 0.05 MG/ML IJ SOLN
INTRAMUSCULAR | Status: AC
  Filled 2013-10-30: qty 5

## 2013-10-30 MED ORDER — LACTATED RINGERS IV SOLN
INTRAVENOUS | Status: DC | PRN
  Administered 2013-10-30 (×2): via INTRAVENOUS

## 2013-10-30 MED ORDER — ROCURONIUM BROMIDE 100 MG/10ML IV SOLN
INTRAVENOUS | Status: AC
  Filled 2013-10-30: qty 1

## 2013-10-30 MED ORDER — PROPOFOL 10 MG/ML IV BOLUS
INTRAVENOUS | Status: DC | PRN
  Administered 2013-10-30: 200 mg via INTRAVENOUS

## 2013-10-30 MED ORDER — DEXAMETHASONE SODIUM PHOSPHATE 10 MG/ML IJ SOLN
INTRAMUSCULAR | Status: AC
  Filled 2013-10-30: qty 1

## 2013-10-30 MED ORDER — ONDANSETRON HCL 4 MG/2ML IJ SOLN
INTRAMUSCULAR | Status: DC | PRN
  Administered 2013-10-30: 4 mg via INTRAVENOUS

## 2013-10-30 MED ORDER — ONDANSETRON HCL 4 MG/2ML IJ SOLN
4.0000 mg | INTRAMUSCULAR | Status: DC | PRN
  Administered 2013-10-30 – 2013-10-31 (×2): 4 mg via INTRAVENOUS
  Filled 2013-10-30 (×2): qty 2

## 2013-10-30 MED ORDER — LABETALOL HCL 5 MG/ML IV SOLN
INTRAVENOUS | Status: DC | PRN
  Administered 2013-10-30: 5 mg via INTRAVENOUS

## 2013-10-30 MED ORDER — MORPHINE SULFATE 2 MG/ML IJ SOLN
2.0000 mg | INTRAMUSCULAR | Status: DC | PRN
  Administered 2013-10-30: 4 mg via INTRAVENOUS
  Administered 2013-10-30 (×2): 2 mg via INTRAVENOUS
  Administered 2013-10-30 – 2013-10-31 (×4): 4 mg via INTRAVENOUS
  Administered 2013-10-31: 2 mg via INTRAVENOUS
  Filled 2013-10-30 (×2): qty 1
  Filled 2013-10-30 (×4): qty 2
  Filled 2013-10-30: qty 1

## 2013-10-30 MED ORDER — ATROPINE SULFATE 0.4 MG/ML IJ SOLN
INTRAMUSCULAR | Status: AC
  Filled 2013-10-30: qty 1

## 2013-10-30 MED ORDER — LEVOFLOXACIN IN D5W 750 MG/150ML IV SOLN
750.0000 mg | INTRAVENOUS | Status: AC
  Administered 2013-10-30: 750 mg via INTRAVENOUS
  Filled 2013-10-30: qty 150

## 2013-10-30 MED ORDER — LACTATED RINGERS IV SOLN: INTRAVENOUS | Status: DC

## 2013-10-30 MED ORDER — TISSEEL VH 10 ML EX KIT
PACK | CUTANEOUS | Status: AC
  Filled 2013-10-30: qty 1

## 2013-10-30 MED ORDER — ACETAMINOPHEN 160 MG/5ML PO SOLN: 650.0000 mg | ORAL | Status: DC | PRN

## 2013-10-30 MED ORDER — BUPIVACAINE-EPINEPHRINE 0.25% -1:200000 IJ SOLN
INTRAMUSCULAR | Status: AC
  Filled 2013-10-30: qty 1

## 2013-10-30 MED ORDER — ENOXAPARIN SODIUM 30 MG/0.3ML ~~LOC~~ SOLN
30.0000 mg | Freq: Two times a day (BID) | SUBCUTANEOUS | Status: DC
  Administered 2013-10-31 – 2013-11-01 (×3): 30 mg via SUBCUTANEOUS
  Filled 2013-10-30 (×6): qty 0.3

## 2013-10-30 MED ORDER — PROMETHAZINE HCL 25 MG/ML IJ SOLN
6.2500 mg | INTRAMUSCULAR | Status: DC | PRN
  Administered 2013-10-30: 6.25 mg via INTRAVENOUS

## 2013-10-30 MED ORDER — NEOSTIGMINE METHYLSULFATE 10 MG/10ML IV SOLN
INTRAVENOUS | Status: DC | PRN
  Administered 2013-10-30: 5 mg via INTRAVENOUS

## 2013-10-30 MED ORDER — GLYCOPYRROLATE 0.2 MG/ML IJ SOLN
INTRAMUSCULAR | Status: DC | PRN
  Administered 2013-10-30: .8 mg via INTRAVENOUS

## 2013-10-30 MED ORDER — DEXAMETHASONE SODIUM PHOSPHATE 10 MG/ML IJ SOLN
INTRAMUSCULAR | Status: DC | PRN
  Administered 2013-10-30: 10 mg via INTRAVENOUS

## 2013-10-30 MED ORDER — ACETAMINOPHEN 160 MG/5ML PO SOLN: 325.0000 mg | ORAL | Status: DC | PRN

## 2013-10-30 MED ORDER — ALUM & MAG HYDROXIDE-SIMETH 200-200-20 MG/5ML PO SUSP: 15.0000 mL | ORAL | Status: DC | PRN

## 2013-10-30 MED ORDER — ACETAMINOPHEN 160 MG/5ML PO SOLN
325.0000 mg | ORAL | Status: DC | PRN
  Filled 2013-10-30: qty 20.3

## 2013-10-30 MED ORDER — MORPHINE SULFATE 4 MG/ML IJ SOLN
INTRAMUSCULAR | Status: AC
  Filled 2013-10-30: qty 1

## 2013-10-30 MED ORDER — SUCCINYLCHOLINE CHLORIDE 20 MG/ML IJ SOLN
INTRAMUSCULAR | Status: DC | PRN
  Administered 2013-10-30: 160 mg via INTRAVENOUS

## 2013-10-30 MED ORDER — FENTANYL CITRATE 0.05 MG/ML IJ SOLN
INTRAMUSCULAR | Status: DC | PRN
  Administered 2013-10-30 (×10): 50 ug via INTRAVENOUS

## 2013-10-30 MED ORDER — MIDAZOLAM HCL 5 MG/5ML IJ SOLN
INTRAMUSCULAR | Status: DC | PRN
  Administered 2013-10-30: 2 mg via INTRAVENOUS

## 2013-10-30 MED ORDER — 0.9 % SODIUM CHLORIDE (POUR BTL) OPTIME
TOPICAL | Status: DC | PRN
  Administered 2013-10-30: 1000 mL

## 2013-10-30 MED ORDER — FENTANYL CITRATE 0.05 MG/ML IJ SOLN
INTRAMUSCULAR | Status: AC
  Filled 2013-10-30: qty 2

## 2013-10-30 MED ORDER — PANTOPRAZOLE SODIUM 40 MG IV SOLR
40.0000 mg | INTRAVENOUS | Status: DC
  Administered 2013-10-30 – 2013-10-31 (×2): 40 mg via INTRAVENOUS
  Filled 2013-10-30 (×3): qty 40

## 2013-10-30 MED ORDER — OXYCODONE HCL 5 MG/5ML PO SOLN
5.0000 mg | ORAL | Status: DC | PRN
  Administered 2013-10-31 – 2013-11-01 (×2): 10 mg via ORAL
  Filled 2013-10-30: qty 25
  Filled 2013-10-30: qty 5
  Filled 2013-10-30: qty 10

## 2013-10-30 MED ORDER — SODIUM CHLORIDE 0.9 % IJ SOLN
INTRAMUSCULAR | Status: AC
  Filled 2013-10-30: qty 10

## 2013-10-30 MED ORDER — ROCURONIUM BROMIDE 100 MG/10ML IV SOLN
INTRAVENOUS | Status: DC | PRN
  Administered 2013-10-30: 10 mg via INTRAVENOUS
  Administered 2013-10-30: 40 mg via INTRAVENOUS
  Administered 2013-10-30: 5 mg via INTRAVENOUS

## 2013-10-30 MED ORDER — KCL IN DEXTROSE-NACL 20-5-0.45 MEQ/L-%-% IV SOLN
INTRAVENOUS | Status: DC
  Administered 2013-10-30 – 2013-10-31 (×3): via INTRAVENOUS
  Administered 2013-10-31: 125 mL via INTRAVENOUS
  Administered 2013-10-31: 14:00:00 via INTRAVENOUS
  Administered 2013-11-01: 125 mL via INTRAVENOUS
  Filled 2013-10-30 (×9): qty 1000

## 2013-10-30 MED ORDER — UNJURY CHOCOLATE CLASSIC POWDER: 2.0000 [oz_av] | Freq: Four times a day (QID) | ORAL | Status: DC

## 2013-10-30 MED ORDER — MEPERIDINE HCL 50 MG/ML IJ SOLN: 6.2500 mg | INTRAMUSCULAR | Status: DC | PRN

## 2013-10-30 MED ORDER — EPHEDRINE SULFATE 50 MG/ML IJ SOLN
INTRAMUSCULAR | Status: AC
  Filled 2013-10-30: qty 1

## 2013-10-30 MED ORDER — LACTATED RINGERS IR SOLN
Status: DC | PRN
  Administered 2013-10-30: 1000 mL

## 2013-10-30 MED ORDER — PREDNISONE 5 MG PO TABS
5.0000 mg | ORAL_TABLET | Freq: Every day | ORAL | Status: DC
  Administered 2013-10-31 – 2013-11-01 (×2): 5 mg via ORAL
  Filled 2013-10-30 (×4): qty 1

## 2013-10-30 MED ORDER — ACETAMINOPHEN 325 MG PO TABS: 325.0000 mg | ORAL_TABLET | ORAL | Status: DC | PRN

## 2013-10-30 MED ORDER — PROMETHAZINE HCL 25 MG/ML IJ SOLN
INTRAMUSCULAR | Status: AC
  Filled 2013-10-30: qty 1

## 2013-10-30 SURGICAL SUPPLY — 69 items
ADH SKN CLS APL DERMABOND .7 (GAUZE/BANDAGES/DRESSINGS) ×1
APL SRG 32X5 SNPLK LF DISP (MISCELLANEOUS) ×1
APPLICATOR COTTON TIP 6IN STRL (MISCELLANEOUS) IMPLANT
APPLIER CLIP ROT 10 11.4 M/L (STAPLE)
APR CLP MED LRG 11.4X10 (STAPLE)
BLADE SURG SZ11 CARB STEEL (BLADE) ×2 IMPLANT
CABLE HIGH FREQUENCY MONO STRZ (ELECTRODE) IMPLANT
CHLORAPREP W/TINT 26ML (MISCELLANEOUS) ×4 IMPLANT
CLIP APPLIE ROT 10 11.4 M/L (STAPLE) IMPLANT
DERMABOND ADVANCED (GAUZE/BANDAGES/DRESSINGS) ×1
DERMABOND ADVANCED .7 DNX12 (GAUZE/BANDAGES/DRESSINGS) ×1 IMPLANT
DEVICE SUT QUICK LOAD TK 5 (STAPLE) ×4 IMPLANT
DEVICE SUT TI-KNOT TK 5X26 (MISCELLANEOUS) IMPLANT
DEVICE SUTURE ENDOST 10MM (ENDOMECHANICALS) ×2 IMPLANT
DEVICE TROCAR PUNCTURE CLOSURE (ENDOMECHANICALS) ×2 IMPLANT
DRAPE CAMERA CLOSED 9X96 (DRAPES) ×2 IMPLANT
DRAPE UTILITY XL STRL (DRAPES) ×4 IMPLANT
ELECT REM PT RETURN 9FT ADLT (ELECTROSURGICAL) ×2
ELECTRODE REM PT RTRN 9FT ADLT (ELECTROSURGICAL) ×1 IMPLANT
EVACUATOR SMOKE 8.L (FILTER) ×2 IMPLANT
GAUZE SPONGE 4X4 12PLY STRL (GAUZE/BANDAGES/DRESSINGS) IMPLANT
GLOVE BIOGEL M STRL SZ7.5 (GLOVE) ×2 IMPLANT
GLOVE BIOGEL PI IND STRL 7.0 (GLOVE) ×1 IMPLANT
GLOVE BIOGEL PI IND STRL 7.5 (GLOVE) ×1 IMPLANT
GLOVE BIOGEL PI INDICATOR 7.0 (GLOVE) ×1
GLOVE BIOGEL PI INDICATOR 7.5 (GLOVE) ×1
GLOVE ECLIPSE 6.5 STRL STRAW (GLOVE) ×2 IMPLANT
GLOVE SURG SS PI 6.5 STRL IVOR (GLOVE) ×2 IMPLANT
GOWN STRL REUS W/TWL XL LVL3 (GOWN DISPOSABLE) ×8 IMPLANT
HANDLE STAPLE EGIA 4 XL (STAPLE) ×2 IMPLANT
HOVERMATT SINGLE USE (MISCELLANEOUS) ×2 IMPLANT
KIT BASIN OR (CUSTOM PROCEDURE TRAY) ×2 IMPLANT
NEEDLE SPNL 22GX3.5 QUINCKE BK (NEEDLE) ×2 IMPLANT
PACK UNIVERSAL I (CUSTOM PROCEDURE TRAY) ×2 IMPLANT
PEN SKIN MARKING BROAD (MISCELLANEOUS) ×2 IMPLANT
RELOAD STAPLER BLUE 60MM (STAPLE) IMPLANT
RELOAD STAPLER GOLD 60MM (STAPLE) IMPLANT
RELOAD STAPLER GREEN 60MM (STAPLE) IMPLANT
RELOAD TRI 45 ART MED THCK BLK (STAPLE) ×2 IMPLANT
RELOAD TRI 60 ART MED THCK BLK (STAPLE) ×2 IMPLANT
RELOAD TRI 60 ART MED THCK PUR (STAPLE) ×8 IMPLANT
SCISSORS LAP 5X35 DISP (ENDOMECHANICALS) IMPLANT
SCISSORS LAP 5X45 EPIX DISP (ENDOMECHANICALS) ×2 IMPLANT
SEALANT SURGICAL APPL DUAL CAN (MISCELLANEOUS) ×2 IMPLANT
SET IRRIG TUBING LAPAROSCOPIC (IRRIGATION / IRRIGATOR) ×2 IMPLANT
SHEARS CURVED HARMONIC AC 45CM (MISCELLANEOUS) ×2 IMPLANT
SLEEVE ADV FIXATION 5X100MM (TROCAR) ×4 IMPLANT
SLEEVE GASTRECTOMY 36FR VISIGI (MISCELLANEOUS) ×2 IMPLANT
SLEEVE XCEL OPT CAN 5 100 (ENDOMECHANICALS) IMPLANT
SOLUTION ANTI FOG 6CC (MISCELLANEOUS) ×2 IMPLANT
STAPLE ECHEON FLEX 60 POW ENDO (STAPLE) IMPLANT
STAPLER ECHELON LONG 60 440 (INSTRUMENTS) IMPLANT
STAPLER RELOAD BLUE 60MM (STAPLE)
STAPLER RELOAD GOLD 60MM (STAPLE)
STAPLER RELOAD GREEN 60MM (STAPLE)
SUT MNCRL AB 4-0 PS2 18 (SUTURE) ×4 IMPLANT
SUT SURGIDAC NAB ES-9 0 48 120 (SUTURE) ×2 IMPLANT
SUT VICRYL 0 TIES 12 18 (SUTURE) ×2 IMPLANT
SYR 20CC LL (SYRINGE) ×2 IMPLANT
SYR 50ML LL SCALE MARK (SYRINGE) ×2 IMPLANT
TOWEL OR 17X26 10 PK STRL BLUE (TOWEL DISPOSABLE) ×2 IMPLANT
TOWEL OR NON WOVEN STRL DISP B (DISPOSABLE) ×2 IMPLANT
TRAY FOLEY CATH 14FRSI W/METER (CATHETERS) IMPLANT
TROCAR ADV FIXATION 5X100MM (TROCAR) ×2 IMPLANT
TROCAR BLADELESS 15MM (ENDOMECHANICALS) IMPLANT
TROCAR BLADELESS OPT 5 100 (ENDOMECHANICALS) ×2 IMPLANT
TUBING CONNECTING 10 (TUBING) ×2 IMPLANT
TUBING ENDO SMARTCAP (MISCELLANEOUS) ×2 IMPLANT
TUBING FILTER THERMOFLATOR (ELECTROSURGICAL) ×2 IMPLANT

## 2013-10-30 NOTE — Interval H&P Note (Signed)
History and Physical Interval Note:  10/30/2013 7:05 AM  [name]  has presented today for surgery, with the diagnosis of Morbid Obesity  The various methods of treatment have been discussed with the patient and family. After consideration of risks, benefits and other options for treatment, the patient has consented to  Procedure(s): LAPAROSCOPIC GASTRIC SLEEVE RESECTION (N/A) as a surgical intervention .  The patient's history has been reviewed, patient examined, no change in status, stable for surgery.  I have reviewed the patient's chart and labs.  Questions were answered to the patient's satisfaction.    [name], MD, [place], Bariatric, & Minimally Invasive Surgery [facility], [place]   [facility] M

## 2013-10-30 NOTE — H&P (View-Only) (Signed)
Patient ID: [name], female   DOB: 1955-10-20, 58 y.o.   MRN: [id number]  Chief Complaint  Patient presents with  . Bariatric Follow Up    sleeve sx 10/30/13    HPI [name] is a 58 y.o. female.   HPI 58 year old morbidly obese Caucasian female comes in today for her preoperative appointment. She has been approved for laparoscopic sleeve gastrectomy with possible hiatal hernia repair. She is currently scheduled to undergo surgery on September 8. She denies any changes since she was last seen. I last saw her in mid July. I have spoken with cerumen collect his since then. She also states that she recently saw him in the past week. They're going to stop the methotrexate and Enbrel one week prior to surgery. She continues to take 5mg  prednisone daily. She has lost about 11 pounds since I last saw her a few weeks ago. Past Medical History  Diagnosis Date  . ANGIOMA 10/03/2008    REMOVED FROM RIGHT LOWER LEG-BENIGN  . HYPERLIPIDEMIA 08/27/2008  . DEPRESSION 12/10/2009  . HYPERTENSION 08/27/2008  . GERD 08/27/2008  . SEBORRHEIC KERATOSIS 08/27/2008  . Arthritis     RA  . Bronchitis     SEASONAL  . Hyperlipemia   . Fibromyalgia     Past Surgical History  Procedure Laterality Date  . Cesarean section  1980  . Cesarean section  1982  . Cholecystectomy  1997  . Abdominal hysterectomy  1995  . Foot surgery  2004, 2005, 2008  . Knee arthroscopy  2007, 2208, 2009  . Nasal sinus surgery  2013  . Joint replacement  10,12    lt total knee  . Breast ductal system excision  08/16/2011    Procedure: EXCISION DUCTAL SYSTEM BREAST;  Surgeon: Joyice Faster. Cornett, MD;  Location: [place];  Service: General;  Laterality: Left;  left breast duct excision  . Open surgical repair of gluteal tendon  01/17/2012    Procedure: OPEN SURGICAL REPAIR OF GLUTEAL TENDON;  Surgeon: Mauri Pole, MD;  Location: [facility];  Service: Orthopedics;  Laterality: Right;    Family History  Problem  Relation Age of Onset  . Cancer Mother     breast  . Cancer Father     prostate  . Diabetes Other     grandparent  . Heart disease Other     parent  . Kidney disease Other     parent  . Cancer Other     parent  . Hypertension Other   . Hyperlipidemia Other     Social History History  Substance Use Topics  . Smoking status: Former Smoker -- 0.50 packs/day for 5 years    Types: Cigarettes    Quit date: 02/22/1990  . Smokeless tobacco: Never Used  . Alcohol Use: No    Allergies  Allergen Reactions  . Codeine   . Codeine Sulfate Other (See Comments)    GI upset  . Penicillin V   . Penicillins Rash    Current Outpatient Prescriptions  Medication Sig Dispense Refill  . acetaminophen (TYLENOL) 500 MG tablet Take 1,000 mg by mouth every 6 (six) hours as needed for pain.       Marland Kitchen aspirin EC 81 MG tablet Take 81 mg by mouth daily.      . ciprofloxacin (CIPRO) 500 MG tablet Take 1 tablet (500 mg total) by mouth 2 (two) times daily.  10 tablet  0  . estradiol (ESTRACE) 1 MG tablet  Take 1 mg by mouth daily.      Marland Kitchen etanercept (ENBREL) 50 MG/ML injection Inject 50 mg into the skin once a week.      . fluticasone (FLONASE) 50 MCG/ACT nasal spray Place 2 sprays into the nose daily as needed for allergies.       . folic acid (FOLVITE) 1 MG tablet Take 2 mg by mouth daily.       . hydrochlorothiazide (HYDRODIURIL) 25 MG tablet Take 25 mg by mouth daily.      . methotrexate (50 MG/ML) 1 gm SOLR Inject 50 mg/m2 into the muscle once a week.      Marland Kitchen omeprazole (PRILOSEC) 20 MG capsule       . oxyCODONE (ROXICODONE) 5 MG/5ML solution Take 5-10 mLs (5-10 mg total) by mouth every 4 (four) hours as needed for severe pain.  200 mL  0  . oxyCODONE-acetaminophen (PERCOCET/ROXICET) 5-325 MG per tablet Take 1 tablet by mouth every 4 (four) hours as needed for pain.       No current facility-administered medications for this visit.    Review of Systems Review of Systems  Constitutional: Negative  for fever, activity change, appetite change and unexpected weight change.  HENT: Negative for nosebleeds and trouble swallowing.   Eyes: Negative for photophobia and visual disturbance.  Respiratory: Negative for chest tightness and shortness of breath.   Cardiovascular: Negative for chest pain and leg swelling.       Denies CP, SOB, orthopnea, PND, DOE  Gastrointestinal: Negative for nausea, vomiting, abdominal pain and diarrhea.       + reflux  Genitourinary: Negative for dysuria and difficulty urinating.  Musculoskeletal: Negative for arthralgias.       +RA on methotrexate, Enbrel, and prednisone. Has been on prednisone for many years.  Skin: Negative for pallor and rash.  Neurological: Negative for dizziness, seizures, facial asymmetry and numbness.       Denies TIA and amaurosis fugax   Hematological: Negative for adenopathy. Does not bruise/bleed easily.  Psychiatric/Behavioral: Negative for behavioral problems and agitation.    Blood pressure 136/76, pulse 86, temperature 98 F (36.7 C), resp. rate 18, height 5\' 4"  (1.626 m), weight 284 lb (128.822 kg).  Physical Exam Physical Exam  Vitals reviewed. Constitutional: She is oriented to person, place, and time. She appears well-developed and well-nourished. No distress.  Morbidly obese  HENT:  Head: Normocephalic and atraumatic.  Right Ear: External ear normal.  Left Ear: External ear normal.  Eyes: Conjunctivae are normal. No scleral icterus.  Neck: Normal range of motion. Neck supple. No tracheal deviation present. No thyromegaly present.  Cardiovascular: Normal rate and normal heart sounds.   Pulmonary/Chest: Effort normal and breath sounds normal. No stridor. No respiratory distress. She has no wheezes.  Abdominal: Soft. She exhibits no distension. There is no tenderness. There is no rebound and no guarding.    Musculoskeletal: She exhibits no edema and no tenderness.  Lymphadenopathy:    She has no cervical  adenopathy.  Neurological: She is alert and oriented to person, place, and time. She exhibits normal muscle tone.  Skin: Skin is warm and dry. No rash noted. She is not diaphoretic. No erythema.  Psychiatric: She has a normal mood and affect. Her behavior is normal. Judgment and thought content normal.    Data Reviewed My office notes Cardiology clearance letter 6/11 Dr Cathie Olden Labs April 2015- ok - hgb 11.6 UGI - +hiatal hernia CXR - normal  Assessment    Morbid  obesity BMI 48.75 Gastroesophageal reflux disease Hypertension Hypothyroidism Rheumatoid arthritis on immunosuppression History of hyperlipidemia     Plan    We reviewed her workup today. We discussed the findings of a hiatal hernia. I discussed that during surgery we would test for a clinically significant hiatal hernia. Found to have a significant defect I recommended proceeding with repair at the time of surgery. We discussed that would involve dissecting out the diaphragm muscles and placing several stitches between the diaphragm muscles.  Otherwise her workup was not that revealing. We'll rediscuss the fact that she was at a higher risk for perioperative infection, leak because of her immunosuppression. She understands this. Her husband was present during this consultation. Fortunately we will be able to hold her Enbrel and methotrexate however it was strongly encouraged by her rheumatologist to continue her prednisone 5mg  daily which we will do.   She was given her postoperative pain medicine prescription today. We discussed the importance of the preoperative diet. I congratulated her on her weight loss today. I advised her to contact the office should she have any additional questions over the next week or so  Note: This dictation was prepared with Dragon/digital dictation along with [employer]. Any transcriptional errors that result from this process are unintentional.  [name], MD, [name] General,  Bariatric, & Minimally Invasive Surgery [facility], [place]         [facility] M 10/19/2013, 1:01 PM

## 2013-10-30 NOTE — Anesthesia Postprocedure Evaluation (Signed)
   Anesthesia Post-op Note  Patient: [name]  Procedure(s) Performed: Procedure(s) (LRB): LAPAROSCOPIC GASTRIC SLEEVE RESECTION WITH HIATAL HERNIA REPAIR (N/A)  Patient Location: PACU  Anesthesia Type: General  Level of Consciousness: awake and alert   Airway and Oxygen Therapy: Patient Spontanous Breathing  Post-op Pain: mild  Post-op Assessment: Post-op Vital signs reviewed, Patient's Cardiovascular Status Stable, Respiratory Function Stable, Patent Airway and No signs of Nausea or vomiting  Last Vitals:  Filed Vitals:   10/30/13 0931  BP: 148/65  Pulse: 68  Temp: 36.3 C  Resp: 16    Post-op Vital Signs: stable   Complications: No apparent anesthesia complications

## 2013-10-30 NOTE — Op Note (Signed)
10/30/2013 Royetta Crochet November 07, 1955 [id number]   PRE-OPERATIVE DIAGNOSIS:   Morbid obesity BMI 49  Gastroesophageal reflux disease  Hypertension  Hypothyroidism  Rheumatoid arthritis on immunosuppression  History of hyperlipidemia  Hiatal Hernia  POST-OPERATIVE DIAGNOSIS:  same  PROCEDURE:  Procedure(s): LAPAROSCOPIC SLEEVE GASTRECTOMY with Hiatal Hernia Repair UPPER GI ENDOSCOPY  SURGEON:  Surgeon(s): [name], MD [name]  ASSISTANTS: Johnathan Hausen MD [name]  ANESTHESIA:   general  DRAINS: none   BOUGIE: 51 fr ViSiGi  LOCAL MEDICATIONS USED:  MARCAINE     SPECIMEN:  Source of Specimen:  Greater curvature of stomach  DISPOSITION OF SPECIMEN:  PATHOLOGY  COUNTS:  YES  INDICATION FOR PROCEDURE: This is a very pleasant 58 year old morbidly obese WF who has had unsuccessful attempts for sustained weight loss. She presents today for a planned laparoscopic sleeve gastrectomy with upper endoscopy & possible hiatal hernia repair. We have discussed the risk and benefits of the procedure extensively preoperatively. Please see my separate notes.  PROCEDURE: After obtaining informed consent and receiving 5000 units of subcutaneous heparin, the patient was brought to the operating room at [facility] and placed supine on the operating room table. General endotracheal anesthesia was established. Sequential compression devices were placed. A Foley catheter was placed. A orogastric tube was placed. The patient's abdomen was prepped and draped in the usual standard surgical fashion. She received preoperative IV antibiotics. A surgical timeout was performed.  Access to the abdomen was achieved using a 5 mm 0 laparoscope thru a 5 mm trocar In the left upper Quadrant 2 fingerbreadths below the left subcostal margin using the Optiview technique. Pneumoperitoneum was smoothly established up to 15 mm of mercury. The laparoscope was advanced and the abdominal cavity was surveilled. There  were some thin omental adhesions in the midline.  A 5 mm trocar was placed slightly above and to the left of the umbilicus under direct visualization. I then took down some of the adhesions in the upper midline with harmonic. The patient was then placed in reverse Trendelenburg. The [facility] liver retractor was placed under the left lobe of the liver through a 5 mm trocar incision site in the subxiphoid position. A 5 mm trocar was placed in the lateral right upper quadrant along with a 15 mm trocar in the mid right abdomen  All under direct visualization after local had been infiltrated.  The stomach was inspected. It was completely decompressed and the orogastric tube was removed. Because the patient had GERD and a hiatal hernia was identified on preop imaging, I decided to test intraoperatively for a significant hiatal hernia. A calibration tube was passed down the oropharynx and into the stomach. 15cc of air was inflated into the balloon and then the calibration tube was gently pulled back toward the GE junction. There was no resistance at the GE junciton/hiatus. The calibration tube was desufflated and re-advanced into the stomach and 10 cc of air was inflated into the balloon and then the calibration tube was gently pulled back toward the GE junction. There was no resistance at the GE junciton/hiatus. The tube was desufflated and pulled back into the esophagus. I decided we needed to repair the hiatal hernia defect. The gastrophepatic ligament was incised with harmonic. The right crus was identified and the overlying fat and peritoneum was incised with harmonic. I then bluntly dissected out part of the right crus. The left crus was identified along with where the left and right crus joined. There was a space in  this area. The vagus was identified and preserved. Using a 0 ethibond Endostitch, I reapproximated the left and right crus with a single suture and secured it with a titanium tie knot.   The  calibration tube was re-advanced into the stomach and 10 cc of air was inflated into the balloon and then the calibration tube was gently pulled back toward the GE junction. There was resistance at the GE junciton/hiatus. The tube was desufflated and removed from the patient. We identified the pylorus and measured 6 cm proximal to the pylorus and identified an area of where we would start taking down the short gastric vessels. Harmonic scalpel was used to take down the short gastric vessels along the greater curvature of the stomach. We were able to enter the lesser sac. We continued to march along the greater curvature of the stomach taking down the short gastrics. As we approached the gastrosplenic ligament we took care in this area not to injure the spleen. We were able to take down the entire gastrosplenic ligament. We then mobilized the fundus away from the left crus of diaphragm. There were a few posterior gastric avascular attachments which were taken down with harmonic. This left the stomach completely mobilized. No vessels had been taken down along the lesser curvature of the stomach.  We then reidentified the pylorus. A 36Fr ViSiGi was then placed in the oropharynx and advanced down into the stomach and placed in the distal antrum and positioned along the lesser curvature. It was placed under suction which secured the 36Fr ViSiGi in place along the lesser curve. Then using the Covidien Endo-GIA Tri-Stapler with TRS with 45 mm stapler with a black load, I placed a stapler along the antrum approximately 5-6 cm from the pylorus. The stapler was angled so that there is ample room at the angularis incisura. I then fired the first staple load after inspecting it posteriorly to ensure adequate space both anteriorly and posteriorly. At this point I still was not completely past the angularis so with another black load [this time 68mm black load with TRS], I placed the stapler in position just inside the prior  stapleline. We then rotated the stomach to insure that there was adequate anteriorly as well as posteriorly. The stapler was then fired. At this point I started using 64mm purple load staple cartridges with TRS. The  stapler was then repositioned with a 60 mm purple load with TRS  and we continued to march up along the [place]. My assistant was holding traction along the greater curvature stomach along the cauterized short gastric vessels ensuring that the stomach was symmetrically retracted. Prior to each firing of the staple, we rotated the stomach to ensure that there is adequate stomach left.  As we approached the fundus, the last firing of the stapler was lateral to the esophageal fat pad.  The sleeve was inspected. There is no evidence of cork screw. The staple line appeared hemostatic. The CRNA inflated the ViSiGi to the green zone and the upper abdomen was flooded with saline. There were no bubbles. The sleeve was decompressed and the ViSiGi removed. My assistant scrubbed out and performed an upper endoscopy. The sleeve easily distended with air and the scope was easily advanced to the pylorus. There is no evidence of internal bleeding or cork screwing. There was no narrowing at the angularis. There is no evidence of bubbles. Please see his operative note for further details. The gastric sleeve was decompressed and the endoscope was removed. Tisseel  tissue sealant was applied along the entire length of the staple line. The greater curvature the stomach was grasped with a laparoscopic grasper and removed from the 15 mm trocar site.  The liver retractor was removed. I then closed the 15 mm trocar site with 2 interrupted 0 Vicryl sutures through the fascia using the endoclose. The closure was viewed laparoscopically and it was airtight. Pneumoperitoneum was released. All trocar sites were closed with a 4-0 Monocryl in a subcuticular fashion followed by the application of Dermabond. The Foley catheter was  removed. The patient was extubated and taken to the recovery room in stable condition. All needle, instrument, and sponge counts were correct x2. There are no immediate complications  Covidien Endo GIA Tri-stapler with TRS on all loads - (1) 45 mm black, (1) 60 mm black, (4) 60 mm purple PLAN OF CARE: Admit to inpatient   PATIENT DISPOSITION:  PACU - hemodynamically stable.   Delay start of Pharmacological VTE agent (>24hrs) due to surgical blood loss or risk of bleeding:  no  [name], MD, [name] General, Bariatric, & Minimally Invasive Surgery [facility] Surgery, [place]

## 2013-10-30 NOTE — Op Note (Signed)
CHIHIRO FREY [id number] 06-20-1955 10/30/2013  Preoperative diagnosis: morbid obesity and GERD with [facility]  Postoperative diagnosis: Same   Procedure: Upper endoscopy   Surgeon: Catalina Antigua B. Hassell Done  M.D., [name]   Anesthesia: Gen.   Indications for procedure: 58 yo female undergoing a laparoscopic sleeve gastrectomy and an upper endoscopy was requested to evaluate the staple line .  Description of procedure:  After the final staple firing and the VisiGi was removed, I passed the endoscope with ease and visualized the entire sleeve.  The tube was of uniform caliber, no bleeding was noted, and no bubbles were seen.  The scope was withdrawn followed deflation.    Matt B. Hassell Done, MD, [name] General, Bariatric, & Minimally Invasive Surgery [facility], [place]

## 2013-10-30 NOTE — Transfer of Care (Signed)
Immediate Anesthesia Transfer of Care Note  Patient: [name]  Procedure(s) Performed: Procedure(s) (LRB): LAPAROSCOPIC GASTRIC SLEEVE RESECTION WITH HIATAL HERNIA REPAIR (N/A)  Patient Location: PACU  Anesthesia Type: General  Level of Consciousness: sedated, patient cooperative and responds to stimulation  Airway & Oxygen Therapy: Patient Spontanous Breathing and Patient connected to face mask oxgen  Post-op Assessment: Report given to PACU RN and Post -op Vital signs reviewed and stable  Post vital signs: Reviewed and stable  Complications: No apparent anesthesia complications

## 2013-10-31 ENCOUNTER — Encounter (HOSPITAL_COMMUNITY): Payer: Self-pay | Admitting: General Surgery

## 2013-10-31 ENCOUNTER — Inpatient Hospital Stay (HOSPITAL_COMMUNITY): Payer: BC Managed Care – PPO

## 2013-10-31 LAB — COMPREHENSIVE METABOLIC PANEL
ALT: 54 U/L — ABNORMAL HIGH (ref 0–35)
AST: 56 U/L — ABNORMAL HIGH (ref 0–37)
Albumin: 3.3 g/dL — ABNORMAL LOW (ref 3.5–5.2)
Alkaline Phosphatase: 58 U/L (ref 39–117)
Anion gap: 10 (ref 5–15)
BUN: 12 mg/dL (ref 6–23)
CO2: 26 mEq/L (ref 19–32)
Calcium: 9 mg/dL (ref 8.4–10.5)
Chloride: 105 mEq/L (ref 96–112)
Creatinine, Ser: 0.67 mg/dL (ref 0.50–1.10)
GFR calc Af Amer: >90 mL/min (ref >90)
GFR calc non Af Amer: >90 mL/min (ref >90)
Glucose, Bld: 141 mg/dL — ABNORMAL HIGH (ref 70–99)
Potassium: 4.2 mEq/L (ref 3.7–5.3)
Sodium: 141 mEq/L (ref 137–147)
Total Bilirubin: 0.2 mg/dL — ABNORMAL LOW (ref 0.3–1.2)
Total Protein: 6.3 g/dL (ref 6.0–8.3)

## 2013-10-31 LAB — CBC WITH DIFFERENTIAL/PLATELET
Basophils Absolute: 0 10*3/uL (ref 0.0–0.1)
Basophils Relative: 0 % (ref 0–1)
Eosinophils Absolute: 0 10*3/uL (ref 0.0–0.7)
Eosinophils Relative: 0 % (ref 0–5)
HCT: 37.9 % (ref 36.0–46.0)
Hemoglobin: 12 g/dL (ref 12.0–15.0)
Lymphocytes Relative: 12 % (ref 12–46)
Lymphs Abs: 1.5 10*3/uL (ref 0.7–4.0)
MCH: 27.8 pg (ref 26.0–34.0)
MCHC: 31.7 g/dL (ref 30.0–36.0)
MCV: 87.9 fL (ref 78.0–100.0)
Monocytes Absolute: 1.3 10*3/uL — ABNORMAL HIGH (ref 0.1–1.0)
Monocytes Relative: 10 % (ref 3–12)
Neutro Abs: 10.1 10*3/uL — ABNORMAL HIGH (ref 1.7–7.7)
Neutrophils Relative %: 78 % — ABNORMAL HIGH (ref 43–77)
Platelets: 316 10*3/uL (ref 150–400)
RBC: 4.31 MIL/uL (ref 3.87–5.11)
RDW: 17.2 % — ABNORMAL HIGH (ref 11.5–15.5)
WBC: 13 10*3/uL — ABNORMAL HIGH (ref 4.0–10.5)

## 2013-10-31 LAB — HEMOGLOBIN AND HEMATOCRIT, BLOOD
HCT: 38 % (ref 36.0–46.0)
Hemoglobin: 11.9 g/dL — ABNORMAL LOW (ref 12.0–15.0)

## 2013-10-31 MED ORDER — PROMETHAZINE HCL 25 MG/ML IJ SOLN: 12.5000 mg | Freq: Four times a day (QID) | INTRAMUSCULAR | Status: DC | PRN

## 2013-10-31 MED ORDER — IOHEXOL 300 MG/ML  SOLN
50.0000 mL | Freq: Once | INTRAMUSCULAR | Status: AC | PRN
  Administered 2013-10-31: 20 mL via ORAL

## 2013-10-31 NOTE — Progress Notes (Signed)
Patient ID: [name], female   DOB: Aug 01, 1955, 58 y.o.   MRN: [id number] 1 Day Post-Op  Subjective: Some pain and nausea last night, better with meds and definitely improved this morning. Has been ambulatory.  Objective: Vital signs in last 24 hours: Temp:  [97.4 F (36.3 C)-99.2 F (37.3 C)] 98.1 F (36.7 C) (09/09 0520) Pulse Rate:  [54-72] 67 (09/09 0520) Resp:  [8-18] 18 (09/09 0520) BP: (103-148)/(42-72) 135/62 mmHg (09/09 0520) SpO2:  [97 %-100 %] 100 % (09/09 0520) Weight:  [288 lb 11.2 oz (130.953 kg)] 288 lb 11.2 oz (130.953 kg) (09/09 0525) Last BM Date: 10/28/13  Intake/Output from previous day: 09/08 0701 - 09/09 0700 In: 3615.4 [I.V.:3615.4] Out: 3700 [Urine:3700] Intake/Output this shift:    General appearance: alert, cooperative and no distress Resp: clear to auscultation bilaterally GI: mild to moderate appropriate left upper quadrant tenderness. Incision/Wound: clean and dry  Lab Results:   Recent Labs  10/30/13 1034 10/31/13 0454  WBC  --  13.0*  HGB 12.7 12.0  HCT 38.8 37.9  PLT  --  316   BMET  Recent Labs  10/31/13 0454  NA 141  K 4.2  CL 105  CO2 26  GLUCOSE 141*  BUN 12  CREATININE 0.67  CALCIUM 9.0     Studies/Results: No results found.  Anti-infectives: Anti-infectives   Start     Dose/Rate Route Frequency Ordered Stop   10/30/13 0531  levofloxacin (LEVAQUIN) IVPB 750 mg     750 mg 100 mL/hr over 90 Minutes Intravenous On call to O.R. 10/30/13 0531 10/30/13 0848      Assessment/Plan: s/p Procedure(s): LAPAROSCOPIC GASTRIC SLEEVE RESECTION WITH HIATAL HERNIA REPAIR AND UPPER ENDOSCOPY Stable postoperatively without apparent complication. For Gastrografin swallow this morning.   LOS: 1 day    Yarah Fuente T 10/31/2013

## 2013-10-31 NOTE — Progress Notes (Signed)
Patient alert and oriented, Post op day 1.  Provided support and encouragement.  Encouraged pulmonary toilet, ambulation and small sips of liquids.  All questions answered.  Will continue to monitor. 

## 2013-10-31 NOTE — Care Management Note (Addendum)
    Page 1 of 1   11/01/2013     2:26:46 PM CARE MANAGEMENT NOTE 11/01/2013  Patient:  [name], [name]   Account Number:  [id number]  Date Initiated:  10/31/2013  Documentation initiated by:  [facility]  Subjective/Objective Assessment:   58 Y/O F ADMITTED W/MORBID OBESITY.LAP SLEEVE GASTRECTOMY.     Action/Plan:   FROM HOME.   Anticipated DC Date:  11/01/2013   Anticipated DC Plan:  [place]  CM consult      Choice offered to / List presented to:             Status of service:  Completed, signed off Medicare Important Message given?   (If response is "NO", the following Medicare IM given date fields will be blank) Date Medicare IM given:   Medicare IM given by:   Date Additional Medicare IM given:   Additional Medicare IM given by:    Discharge Disposition:  HOME/SELF CARE  Per UR Regulation:  Reviewed for med. necessity/level of care/duration of stay  If discussed at Long Length of Stay Meetings, dates discussed:    Comments:  11/01/13 Kyo Cocuzza RN,BSN NCM [phone] NO ANTICIPATED D/C NEEDS.

## 2013-10-31 NOTE — Progress Notes (Signed)
Nutrition Education Note  Received consult for diet education per DROP protocol.   Discussed 2 week post op diet with pt. Emphasized that liquids must be non carbonated, non caffeinated, and sugar free. Fluid goals discussed. Pt to follow up with outpatient bariatric RD for further diet progression after 2 weeks. Multivitamins and minerals also reviewed. Teach back method used, pt expressed understanding, expect good compliance.   Diet: First 2 Weeks  You will see the nutritionist about two (2) weeks after your surgery. The nutritionist will increase the types of foods you can eat if you are handling liquids well:  If you have severe vomiting or nausea and cannot handle clear liquids lasting longer than 1 day, call your surgeon  Protein Shake  Drink at least 2 ounces of shake 5-6 times per day  Each serving of protein shakes (usually 8 - 12 ounces) should have a minimum of:  15 grams of protein  And no more than 5 grams of carbohydrate  Goal for protein each day:  Men = 80 grams per day  Women = 60 grams per day  Protein powder may be added to fluids such as non-fat milk or Lactaid milk or Soy milk (limit to 35 grams added protein powder per serving)   Hydration  Slowly increase the amount of water and other clear liquids as tolerated (See Acceptable Fluids)  Slowly increase the amount of protein shake as tolerated  Sip fluids slowly and throughout the day  May use sugar substitutes in small amounts (no more than 6 - 8 packets per day; i.e. Splenda)   Fluid Goal  The first goal is to drink at least 8 ounces of protein shake/drink per day (or as directed by the nutritionist); some examples of protein shakes are [employer], [employer], EAS Edge HP, and Unjury. See handout from pre-op Bariatric Education Class:  Slowly increase the amount of protein shake you drink as tolerated  You may find it easier to slowly sip shakes throughout the day  It is important to get your proteins  in first  Your fluid goal is to drink 64 - 100 ounces of fluid daily  It may take a few weeks to build up to this  32 oz (or more) should be clear liquids  And  32 oz (or more) should be full liquids (see below for examples)  Liquids should not contain sugar, caffeine, or carbonation   Clear Liquids:  Water or Sugar-free flavored water (i.e. Fruit H2O, Propel)  Decaffeinated coffee or tea (sugar-free)  Crystal Lite, Wyler's Lite, Minute Maid Lite  Sugar-free Jell-O  Bouillon or broth  Sugar-free Popsicle: *Less than 20 calories each; Limit 1 per day   Full Liquids:  Protein Shakes/Drinks + 2 choices per day of other full liquids  Full liquids must be:  No More Than 12 grams of Carbs per serving  No More Than 3 grams of Fat per serving  Strained low-fat cream soup  Non-Fat milk  Fat-free Lactaid Milk  Sugar-free yogurt (Dannon Lite & Fit, [place] yogurt)     [place] MS, RD, [place] [phone] Pager [phone] Weekend/After Hours Pager

## 2013-11-01 DIAGNOSIS — M069 Rheumatoid arthritis, unspecified: Secondary | ICD-10-CM | POA: Diagnosis present

## 2013-11-01 LAB — CBC WITH DIFFERENTIAL/PLATELET
Basophils Absolute: 0.1 10*3/uL (ref 0.0–0.1)
Basophils Relative: 1 % (ref 0–1)
Eosinophils Absolute: 0.2 10*3/uL (ref 0.0–0.7)
Eosinophils Relative: 2 % (ref 0–5)
HCT: 37.4 % (ref 36.0–46.0)
Hemoglobin: 11.5 g/dL — ABNORMAL LOW (ref 12.0–15.0)
Lymphocytes Relative: 31 % (ref 12–46)
Lymphs Abs: 2.7 10*3/uL (ref 0.7–4.0)
MCH: 27.5 pg (ref 26.0–34.0)
MCHC: 30.7 g/dL (ref 30.0–36.0)
MCV: 89.5 fL (ref 78.0–100.0)
Monocytes Absolute: 0.8 10*3/uL (ref 0.1–1.0)
Monocytes Relative: 9 % (ref 3–12)
Neutro Abs: 5 10*3/uL (ref 1.7–7.7)
Neutrophils Relative %: 57 % (ref 43–77)
Platelets: 301 10*3/uL (ref 150–400)
RBC: 4.18 MIL/uL (ref 3.87–5.11)
RDW: 17.7 % — ABNORMAL HIGH (ref 11.5–15.5)
WBC: 8.8 10*3/uL (ref 4.0–10.5)

## 2013-11-01 MED ORDER — SERTRALINE HCL 50 MG PO TABS
50.0000 mg | ORAL_TABLET | Freq: Every morning | ORAL | Status: DC
  Administered 2013-11-01: 50 mg via ORAL
  Filled 2013-11-01: qty 1

## 2013-11-01 MED ORDER — LISINOPRIL 20 MG PO TABS
20.0000 mg | ORAL_TABLET | Freq: Every morning | ORAL | Status: DC
  Administered 2013-11-01: 20 mg via ORAL
  Filled 2013-11-01: qty 1

## 2013-11-01 MED ORDER — ETANERCEPT 50 MG/ML ~~LOC~~ SOSY: 50.0000 mg | PREFILLED_SYRINGE | SUBCUTANEOUS | Status: DC

## 2013-11-01 MED ORDER — ONDANSETRON HCL 4 MG PO TABS: 4.0000 mg | ORAL_TABLET | Freq: Three times a day (TID) | ORAL | Status: DC | PRN

## 2013-11-01 MED ORDER — METHOTREXATE INJECTION FOR WOMEN'S HOSPITAL: INTRAMUSCULAR | Status: DC

## 2013-11-01 NOTE — Progress Notes (Signed)
Patient alert and oriented, pain is controlled. Patient is tolerating fluids, advanced to protein shake today patient tolerated well.  Reviewed Gastric sleeve discharge instructions with patient and patient is able to articulate understanding.  Provided information on BELT program, Support Group and [facility] outpatient pharmacy. All questions answered, will continue to monitor.  

## 2013-11-01 NOTE — Progress Notes (Signed)
2 Days Post-Op  Subjective: Doing well. Pain controlled. Some nausea with pain med - just some nausea with all pain med. Water going ok. Occasionally feels it just sits there  Objective: Vital signs in last 24 hours: Temp:  [98.1 F (36.7 C)-98.8 F (37.1 C)] 98.8 F (37.1 C) (09/10 0558) Pulse Rate:  [64-82] 82 (09/10 0558) Resp:  [18] 18 (09/10 0558) BP: (108-151)/(50-61) 108/51 mmHg (09/10 0558) SpO2:  [95 %-100 %] 95 % (09/10 0558) Weight:  [291 lb 3.6 oz (132.1 kg)] 291 lb 3.6 oz (132.1 kg) (09/10 0707) Last BM Date: 10/28/13  Intake/Output from previous day: 09/09 0701 - 09/10 0700 In: 2987.1 [P.O.:60; I.V.:2927.1] Out: 1550 [Urine:1550] Intake/Output this shift:    Alert, nad, sitting in rocking chair cta b/l Reg Soft, obese, mild approp incisional TTP, incisions c/d/i No edema  Lab Results:   Recent Labs  10/31/13 0454 10/31/13 1556 11/01/13 0510  WBC 13.0*  --  8.8  HGB 12.0 11.9* 11.5*  HCT 37.9 38.0 37.4  PLT 316  --  301   BMET  Recent Labs  10/31/13 0454  NA 141  K 4.2  CL 105  CO2 26  GLUCOSE 141*  BUN 12  CREATININE 0.67  CALCIUM 9.0   PT/INR No results found for this basename: LABPROT, INR,  in the last 72 hours ABG No results found for this basename: PHART, PCO2, PO2, HCO3,  in the last 72 hours  Studies/Results: Dg Ugi W/water Sol Cm  10/31/2013   CLINICAL DATA:  Postop gastric sleeve.  EXAM: WATER SOLUBLE UPPER GI SERIES  TECHNIQUE: Single-column upper GI series was performed using water soluble contrast.  CONTRAST:  28mL OMNIPAQUE IOHEXOL 300 MG/ML  SOLN  COMPARISON:  07/11/2013 upper GI series  FLUOROSCOPY TIME:  1 min 46 seconds  FINDINGS: Scout radiograph demonstrates right upper quadrant surgical clips and left upper quadrant suture material. Gas is present nondilated loops of small and large bowel. Small amount of stool is present in the colon.  The patient drank small sips of contrast without difficulty. Contrast passed readily  through the esophagus into the proximal stomach. Contrast initially collected in the proximal stomach with very slow progression into decompressed mid and distal stomach. Distal passage improved with more upright positioning. Contrast did pass into the proximal small bowel. There is no evidence of contrast leak.  IMPRESSION: 1. No evidence of leak. 2. Delayed passage of contrast from the proximal stomach into the mid and distal stomach and small bowel, which may reflect gastric edema secondary to recent surgery.   Electronically Signed   By: Logan Bores   On: 10/31/2013 09:00    Anti-infectives: Anti-infectives   Start     Dose/Rate Route Frequency Ordered Stop   10/30/13 0531  levofloxacin (LEVAQUIN) IVPB 750 mg     750 mg 100 mL/hr over 90 Minutes Intravenous On call to O.R. 10/30/13 0531 10/30/13 0848      Assessment/Plan: s/p Procedure(s): LAPAROSCOPIC GASTRIC SLEEVE RESECTION WITH HIATAL HERNIA REPAIR AND UPPER ENDOSCOPY (N/A)  No fever, tachycardia. Tolerating water as expected will adv to POD2 diet Restart essential home meds including home daily prednisone. Rheumatologist strongly discouraged from holding perioperatively while holding enbrel and mtx Discussed d/c instructions Has issues will all oral narcotics. Will give zofran at dc as needed for nausea.  Will check back around noon to see how doing to make determination about d/c home today  [name], MD, [name] General, Bariatric, & Minimally Invasive Surgery  [facility], [place]   LOS: 2 days    [name] 11/01/2013

## 2013-11-01 NOTE — Discharge Instructions (Signed)

## 2013-11-01 NOTE — Discharge Summary (Signed)
Physician Discharge Summary  [name] GXQ:[id number] DOB: 1955-09-15 DOA: 10/30/2013  PCP: Eulas Post, MD  Admit date: 10/30/2013 Discharge date: 11/01/2013  Recommendations for Outpatient Follow-up:   Follow-up Information   Follow up with [name], MD On 11/07/2013. (10:30 (arrive 10 AM))    Specialty:  General Surgery   Contact information:   [address] [place] [address] [phone]      Discharge Diagnoses:  Active Problems:   HYPERLIPIDEMIA   DEPRESSION   HYPERTENSION   Morbid obesity with BMI of 45.0-49.9, adult   S/P laparoscopic sleeve gastrectomy   Rheumatoid arthritis   Surgical Procedure: Laparoscopic Sleeve Gastrectomy with hiatal hernia repair, upper endoscopy  Discharge Condition: Good Disposition: Home  Diet recommendation: Postoperative gastric sleeve diet  Filed Weights   10/30/13 0540 10/31/13 0525 11/01/13 0707  Weight: 286 lb 2 oz (129.785 kg) 288 lb 11.2 oz (130.953 kg) 291 lb 3.6 oz (132.1 kg)     Hospital Course:  The patient was admitted for a planned laparoscopic sleeve gastrectomy. Please see operative note. Preoperatively the patient was given 5000 units of subcutaneous heparin for DVT prophylaxis. Postoperative prophylactic Lovenox dosing was started on the morning of postoperative day 1. The patient underwent an upper GI on postoperative day 1 which demonstrated no extravasation of contrast and a little bit of slow emptying of the contrast into the small intestine. The patient was started on ice chips and water which they tolerated. On postoperative day 2 The patient's diet was advanced to protein shakes which they also tolerated. The patient was ambulating without difficulty. Their vital signs are stable without fever or tachycardia.. The patient had received discharge instructions and counseling. They were deemed stable for discharge.   Discharge Instructions     Medication List    STOP taking these  medications       aspirin EC 81 MG tablet     ciprofloxacin 500 MG tablet  Commonly known as:  CIPRO     estradiol 1 MG tablet  Commonly known as:  ESTRACE     hydrochlorothiazide 25 MG tablet  Commonly known as:  HYDRODIURIL      TAKE these medications       acetaminophen 500 MG tablet  Commonly known as:  TYLENOL  Take 1,000 mg by mouth every 6 (six) hours as needed for pain.     B-complex with vitamin C tablet  Take 1 tablet by mouth daily.     calcium-vitamin D 500-200 MG-UNIT per tablet  Commonly known as:  OSCAL WITH D  Take 2 tablets by mouth 2 (two) times daily.     etanercept 50 MG/ML injection  Commonly known as:  ENBREL  Inject 0.98 mLs (50 mg total) into the skin once a week. Resume end of next week     FLINSTONES GUMMIES OMEGA-3 DHA PO  Take 4 tablets by mouth daily.     fluticasone 50 MCG/ACT nasal spray  Commonly known as:  FLONASE  Place 2 sprays into the nose daily as needed for allergies.     folic acid 1 MG tablet  Commonly known as:  FOLVITE  Take 2 mg by mouth daily.     lisinopril 20 MG tablet  Commonly known as:  PRINIVIL,ZESTRIL  Take 20 mg by mouth every morning.     methotrexate 1 gm Solr  Commonly known as:  50 mg/ml  May resume previous dose at  end of next week  omeprazole 20 MG capsule  Commonly known as:  PRILOSEC  Take 20 mg by mouth daily.     ondansetron 4 MG tablet  Commonly known as:  ZOFRAN  Take 1 tablet (4 mg total) by mouth every 8 (eight) hours as needed for nausea or vomiting.     oxyCODONE-acetaminophen 5-325 MG per tablet  Commonly known as:  PERCOCET/ROXICET  Take 1 tablet by mouth every 6 (six) hours as needed for moderate pain or severe pain.     pramipexole 0.125 MG tablet  Commonly known as:  MIRAPEX  Take 0.375 mg by mouth at bedtime.     predniSONE 5 MG tablet  Commonly known as:  DELTASONE  Take 5 mg by mouth daily with breakfast.     sertraline 50 MG tablet  Commonly known as:  ZOLOFT  Take  50 mg by mouth every morning.     simvastatin 20 MG tablet  Commonly known as:  ZOCOR  Take 20 mg by mouth daily.     zolpidem 10 MG tablet  Commonly known as:  AMBIEN  Take 10 mg by mouth at bedtime as needed for sleep.           Follow-up Information   Follow up with [name], MD On 11/07/2013. (10:30 (arrive 10 AM))    Specialty:  General Surgery   Contact information:   [address] [place]  [address] [phone]        The results of significant diagnostics from this hospitalization (including imaging, microbiology, ancillary and laboratory) are listed below for reference.    Significant Diagnostic Studies: Dg Ugi W/water Sol Cm  10/31/2013   CLINICAL DATA:  Postop gastric sleeve.  EXAM: WATER SOLUBLE UPPER GI SERIES  TECHNIQUE: Single-column upper GI series was performed using water soluble contrast.  CONTRAST:  58mL OMNIPAQUE IOHEXOL 300 MG/ML  SOLN  COMPARISON:  07/11/2013 upper GI series  FLUOROSCOPY TIME:  1 min 46 seconds  FINDINGS: Scout radiograph demonstrates right upper quadrant surgical clips and left upper quadrant suture material. Gas is present nondilated loops of small and large bowel. Small amount of stool is present in the colon.  The patient drank small sips of contrast without difficulty. Contrast passed readily through the esophagus into the proximal stomach. Contrast initially collected in the proximal stomach with very slow progression into decompressed mid and distal stomach. Distal passage improved with more upright positioning. Contrast did pass into the proximal small bowel. There is no evidence of contrast leak.  IMPRESSION: 1. No evidence of leak. 2. Delayed passage of contrast from the proximal stomach into the mid and distal stomach and small bowel, which may reflect gastric edema secondary to recent surgery.   Electronically Signed   By: Logan Bores   On: 10/31/2013 09:00    Labs: Basic Metabolic Panel:  Recent Labs Lab  10/31/13 0454  NA 141  K 4.2  CL 105  CO2 26  GLUCOSE 141*  BUN 12  CREATININE 0.67  CALCIUM 9.0   Liver Function Tests:  Recent Labs Lab 10/31/13 0454  AST 56*  ALT 54*  ALKPHOS 58  BILITOT 0.2*  PROT 6.3  ALBUMIN 3.3*    CBC:  Recent Labs Lab 10/30/13 1034 10/31/13 0454 10/31/13 1556 11/01/13 0510  WBC  --  13.0*  --  8.8  NEUTROABS  --  10.1*  --  5.0  HGB 12.7 12.0 11.9* 11.5*  HCT 38.8 37.9 38.0 37.4  MCV  --  87.9  --  89.5  PLT  --  316  --  301    CBG: No results found for this basename: GLUCAP,  in the last 168 hours  Active Problems:   HYPERLIPIDEMIA   DEPRESSION   HYPERTENSION   Morbid obesity with BMI of 45.0-49.9, adult   S/P laparoscopic sleeve gastrectomy   Rheumatoid arthritis   Time coordinating discharge: 15 minutes  Signed:  [name], MD [facility], [place] [phone] 11/01/2013, 1:03 PM

## 2013-11-05 ENCOUNTER — Encounter (HOSPITAL_COMMUNITY): Payer: Self-pay | Admitting: Anesthesiology

## 2013-11-05 ENCOUNTER — Encounter (HOSPITAL_COMMUNITY): Payer: Self-pay | Admitting: *Deleted

## 2013-11-05 ENCOUNTER — Other Ambulatory Visit (INDEPENDENT_AMBULATORY_CARE_PROVIDER_SITE_OTHER): Payer: Self-pay | Admitting: General Surgery

## 2013-11-05 ENCOUNTER — Inpatient Hospital Stay (HOSPITAL_COMMUNITY): Payer: BC Managed Care – PPO

## 2013-11-05 ENCOUNTER — Inpatient Hospital Stay (HOSPITAL_COMMUNITY)
Admission: AD | Admit: 2013-11-05 | Discharge: 2013-11-09 | DRG: 392 | Disposition: A | Discharge status: Home / Self Care | Payer: BC Managed Care – PPO | Source: Ambulatory Visit | Attending: General Surgery | Admitting: General Surgery

## 2013-11-05 ENCOUNTER — Telehealth (HOSPITAL_COMMUNITY): Payer: Self-pay

## 2013-11-05 DIAGNOSIS — R112 Nausea with vomiting, unspecified: Secondary | ICD-10-CM | POA: Diagnosis present

## 2013-11-05 DIAGNOSIS — M069 Rheumatoid arthritis, unspecified: Secondary | ICD-10-CM | POA: Diagnosis present

## 2013-11-05 DIAGNOSIS — IMO0002 Reserved for concepts with insufficient information to code with codable children: Secondary | ICD-10-CM | POA: Diagnosis not present

## 2013-11-05 DIAGNOSIS — E669 Obesity, unspecified: Secondary | ICD-10-CM | POA: Diagnosis present

## 2013-11-05 DIAGNOSIS — Z23 Encounter for immunization: Secondary | ICD-10-CM

## 2013-11-05 DIAGNOSIS — I10 Essential (primary) hypertension: Secondary | ICD-10-CM | POA: Diagnosis present

## 2013-11-05 DIAGNOSIS — Z9884 Bariatric surgery status: Secondary | ICD-10-CM | POA: Diagnosis not present

## 2013-11-05 DIAGNOSIS — Z6841 Body Mass Index (BMI) 40.0 and over, adult: Secondary | ICD-10-CM | POA: Diagnosis not present

## 2013-11-05 DIAGNOSIS — R1013 Epigastric pain: Secondary | ICD-10-CM | POA: Diagnosis present

## 2013-11-05 MED ORDER — MORPHINE SULFATE 2 MG/ML IJ SOLN
2.0000 mg | INTRAMUSCULAR | Status: DC | PRN
  Administered 2013-11-05 – 2013-11-06 (×7): 2 mg via INTRAVENOUS
  Filled 2013-11-05 (×7): qty 1

## 2013-11-05 MED ORDER — ONDANSETRON HCL 4 MG/2ML IJ SOLN
4.0000 mg | INTRAMUSCULAR | Status: DC | PRN
  Administered 2013-11-05 – 2013-11-08 (×9): 4 mg via INTRAVENOUS
  Filled 2013-11-05 (×9): qty 2

## 2013-11-05 MED ORDER — ONDANSETRON HCL 4 MG/2ML IJ SOLN
4.0000 mg | Freq: Four times a day (QID) | INTRAMUSCULAR | Status: DC | PRN
  Administered 2013-11-05: 4 mg via INTRAVENOUS
  Filled 2013-11-05: qty 2

## 2013-11-05 MED ORDER — KCL IN DEXTROSE-NACL 20-5-0.9 MEQ/L-%-% IV SOLN
INTRAVENOUS | Status: DC
Start: 2013-11-05 — End: 2013-11-09
  Administered 2013-11-05 – 2013-11-08 (×5): via INTRAVENOUS
  Administered 2013-11-09: 100 mL via INTRAVENOUS
  Filled 2013-11-05 (×11): qty 1000

## 2013-11-05 MED ORDER — PANTOPRAZOLE SODIUM 40 MG IV SOLR
40.0000 mg | Freq: Every day | INTRAVENOUS | Status: DC
  Administered 2013-11-05 – 2013-11-08 (×4): 40 mg via INTRAVENOUS
  Filled 2013-11-05 (×5): qty 40

## 2013-11-05 NOTE — H&P (Signed)
[name]. Demars 11/05/2013 4:29 PM Location: [facility] Patient #: [id number] DOB: April 05, 1955 Married / Language: Cleophus Molt / Race: White Female  History of Present Illness Sammuel Hines. Marlou Starks MD; 11/05/2013 4:51 PM) Patient words: S/P gastric sleeve on 10/30/2013, has been unable to keep liquids donw x 24hrs.  The patient is a 58 year old female who presents for a follow-up for vomiting. the patient is a 58 year old white female who is 6 days status post gastric sleeve for weight loss surgery. She initially did well but yesterday began having spasms in her upper abdomen. She has had significant nausea and vomiting. She has not been able to keep any thing down since yesterday. She denies any fevers or chills. She is lightheaded.   Other Problems Lenoria Farrier; 11/05/2013 4:30 PM) Arthritis Cholelithiasis Gastroesophageal Reflux Disease High blood pressure Hypercholesterolemia Melanoma  Past Surgical History Lenoria Farrier; 11/05/2013 4:30 PM) Appendectomy Breast Biopsy Left. Cesarean Section - Multiple Foot Surgery Bilateral. Gallbladder Surgery - Laparoscopic Hip Surgery Right. Hysterectomy (not due to cancer) - Complete Knee Surgery Left. Resection of Stomach Sleeve Gastrectomy  Diagnostic Studies History Lenoria Farrier; 11/05/2013 4:30 PM) Colonoscopy within last year Mammogram within last year Pap Smear >5 years ago  Allergies Lenoria Farrier; 11/05/2013 4:30 PM) Codeine/Codeine Derivatives Penicillins  Medication History Lenoria Farrier; 11/05/2013 4:37 PM) FHP Lisinopril (20MG  Tablet, Oral daily) Active. Flintstones Complete (60MG  Tablet Chewable, Oral daily) Active. [facility] Folic Acid (440NUU Tablet, Oral two times daily) Active. Biotin (10MG  Tablet, Oral daily) Active. B12-Active (1MG  Tablet Chewable, Oral daily) Active. PriLOSEC OTC (20MG  Tablet DR, Oral two times daily) Active. Zofran (4MG /5ML Solution, Oral as needed)  Active. OxyCODONE HCl (5MG  Capsule, Oral as needed) Active. PredniSONE (Pak) (5MG  Tablet, Oral daily) Active. Simvastatin (20MG  Tablet, Oral daily) Active. Zoloft (50MG  Tablet, Oral daily) Active. Mirapex ER (1.5MG  Tablet ER 24HR, 3 Oral daily) Active.  Social History Lenoria Farrier; 11/05/2013 4:30 PM) No alcohol use No caffeine use No drug use Tobacco use Former smoker.  Family History Lenoria Farrier; 11/05/2013 4:30 PM) Arthritis Father, Mother. Breast Cancer Mother. Cancer Mother, Sister. Cervical Cancer Sister. Colon Cancer Sister. Colon Polyps Sister. Diabetes Mellitus Son. Heart Disease Mother, Sister. Heart disease in female family member before age 78 Hypertension Father, Mother, Sister. Kidney Disease Father. Melanoma Family Members In General. Ovarian Cancer Sister. Respiratory Condition Mother, Sister. Thyroid problems Son.  Pregnancy / Birth History Lenoria Farrier; 11/05/2013 4:30 PM) Age at menarche 3 years. Age of menopause <45 Gravida 2 Maternal age 59-25 Para 2  Review of Systems Lenoria Farrier; 11/05/2013 4:30 PM) General Present- Appetite Loss. Not Present- Chills, Fatigue, Fever, Night Sweats, Weight Gain and Weight Loss. Skin Not Present- Change in Wart/Mole, Dryness, Hives, Jaundice, New Lesions, Non-Healing Wounds, Rash and Ulcer. HEENT Not Present- Earache, Hearing Loss, Hoarseness, Nose Bleed, Oral Ulcers, Ringing in the Ears, Seasonal Allergies, Sinus Pain, Sore Throat, Visual Disturbances, Wears glasses/contact lenses and Yellow Eyes. Respiratory Not Present- Bloody sputum, Chronic Cough, Difficulty Breathing, Snoring and Wheezing. Breast Not Present- Breast Mass, Breast Pain, Nipple Discharge and Skin Changes. Cardiovascular Not Present- Chest Pain, Difficulty Breathing Lying Down, Leg Cramps, Palpitations, Rapid Heart Rate, Shortness of Breath and Swelling of Extremities. Gastrointestinal Present- Abdominal  Pain and Nausea. Not Present- Bloating, Bloody Stool, Change in Bowel Habits, Chronic diarrhea, Constipation, Difficulty Swallowing, Excessive gas, Gets full quickly at meals, Hemorrhoids, Indigestion, Rectal Pain and Vomiting. Female Genitourinary Not Present- Frequency, Nocturia, Painful Urination, Pelvic Pain and Urgency. Musculoskeletal Not Present- Back Pain, Joint  Pain, Joint Stiffness, Muscle Pain, Muscle Weakness and Swelling of Extremities. Neurological Not Present- Decreased Memory, Fainting, Headaches, Numbness, Seizures, Tingling, Tremor, Trouble walking and Weakness. Psychiatric Not Present- Anxiety, Bipolar, Change in Sleep Pattern, Depression, Fearful and Frequent crying. Endocrine Not Present- Cold Intolerance, Excessive Hunger, Hair Changes, Heat Intolerance, Hot flashes and New Diabetes. Hematology Not Present- Easy Bruising, Excessive bleeding, Gland problems, HIV and Persistent Infections.   Vitals Apolonio Schneiders [place]; 11/05/2013 4:30 PM) 11/05/2013 4:30 PM Weight: 280.38 lb Height: 64in Body Surface Area: 2.4 m Body Mass Index: 48.13 kg/m Temp.: 98.77F(Oral)  Pulse: 90 (Regular)  Resp.: 18 (Unlabored)  BP: 156/86 (Sitting, Left Arm, Standard)    Physical Exam Eddie Dibbles S. Marlou Starks MD; 11/05/2013 4:52 PM) General Mental Status-Alert. General Appearance-Consistent with stated age. Hydration-Well hydrated. Voice-Normal.  Head and Neck Head-normocephalic, atraumatic with no lesions or palpable masses. Trachea-midline. Thyroid Gland Characteristics - normal size and consistency.  Eye Eyeball - Bilateral-Extraocular movements intact. Sclera/Conjunctiva - Bilateral-No scleral icterus.  Chest and Lung Exam Chest and lung exam reveals -quiet, even and easy respiratory effort with no use of accessory muscles, normal resonance, no flatness or dullness, non-tender and normal tactile fremitus and on auscultation, normal breath sounds, no adventitious  sounds and normal vocal resonance. Inspection Chest Wall - Normal. Back - normal.  Breast Breast - Left-Symmetric, Non Tender, No Biopsy scars, no Dimpling, No Inflammation, No Lumpectomy scars, No Mastectomy scars, No Peau d' Orange. Breast - Right-Symmetric, Non Tender, No Biopsy scars, no Dimpling, No Inflammation, No Lumpectomy scars, No Mastectomy scars, No Peau d' Orange. Breast Lump-No Palpable Breast Mass.  Cardiovascular Cardiovascular examination reveals -on palpation PMI is normal in location and amplitude, no palpable S3 or S4. Normal cardiac borders., normal heart sounds, regular rate and rhythm with no murmurs, carotid auscultation reveals no bruits and normal pedal pulses bilaterally.  Abdomen Inspection Inspection of the abdomen reveals - No Hernias. Palpation/Percussion Palpation and Percussion of the abdomen reveal - Soft, No Rebound tenderness, No Rigidity (guarding) and No hepatosplenomegaly. Note: there is tenderness along the upper abdomen but there does not appear to be peritonitis. Her abdomen is quiet with no bowel sounds. Auscultation Auscultation of the abdomen reveals - Bowel sounds normal.  Peripheral Vascular Upper Extremity Inspection - Bilateral - Normal - No Clubbing, No Cyanosis, No Edema, Pulses Intact. Palpation - Pulses bilaterally normal. Lower Extremity Palpation - Pulses bilaterally normal.  Neurologic Neurologic evaluation reveals -alert and oriented x 3 with no impairment of recent or remote memory. Mental Status-Normal.  Musculoskeletal Normal Exam - Left-Upper Extremity Strength Normal and Lower Extremity Strength Normal. Normal Exam - Right-Upper Extremity Strength Normal, Lower Extremity Weakness.  Lymphatic Head & Neck  General Head & Neck Lymphatics: Bilateral - Description - Normal. Axillary  General Axillary Region: Bilateral - Description - Normal. Tenderness - Non Tender. Femoral & Inguinal  Generalized  Femoral & Inguinal Lymphatics: Bilateral - Description - Normal. Tenderness - Non Tender.    Assessment & Plan Eddie Dibbles S. Marlou Starks MD; 11/05/2013 4:53 PM) VOMITING (787.03  R11.10) Impression: the patient had recent weight loss surgery with a gastric sleeve. She was initially doing well but is now having persistent nausea and vomiting and is unable to keep anything down. I am concerned that she may be getting dehydrated. I will plan to admit her for IV hydration. I will plan to get a CT scan to evaluate her abdomen.     Signed by Luella Cook, MD (11/05/2013 4:54 PM)

## 2013-11-05 NOTE — Telephone Encounter (Addendum)
Attempted dc call, left message 11/05/13 @1054   Patient called back informed me that she is having severe dizziness, stomach pain not relieved by pain meds and nausea today --felt great yesterday but has taken a turn -- asked about HTN meds, patient has not taken, asked about nausea meds - patient has taken zofran.  Encouraged patient to call [facility] per pain medications are not helping with stomach pain.  No fever, No increased HR, no trouble breathing.  Will continue to monitor  Made discharge phone call to patient per DROP protocol. Asking the following questions.    1. Do you have someone to care for you now that you are home?  yes 2. Are you having pain now that is not relieved by your pain medication?  yes 3. Are you able to drink the recommended daily amount of fluids (48 ounces minimum/day) and protein (60-80 grams/day) as prescribed by the dietitian or nutritional counselor?  Yes, until today feeling nauseated unable to drink 4. Are you taking the vitamins and minerals as prescribed?  Not today but did this weekend with no problems 5. Do you have the "on call" number to contact your surgeon if you have a problem or question?  yes 6. Are your incisions free of redness, swelling or drainage? (If steri strips, address that these can fall off, shower as tolerated) yes 7. Have your bowels moved since your surgery?  If not, are you passing gas?  yes 8. Are you up and walking 3-4 times per day?  Yes, but very dizzy today     1. Do you have an appointment made to see your surgeon in the next month?  yes 2. Were you provided your discharge medications before your surgery or before you were discharged from the hospital and are you taking them without problem?  yes 3. Were you provided phone numbers to the clinic/surgeon's office?  yes 4. Did you watch the patient education video module in the (clinic, surgeon's office, etc.) before your surgery? yes 5. Do you have a discharge checklist that was  provided to you in the hospital to reference with instructions on how to take care of yourself after surgery?  yes 6. Did you see a dietitian or nutritional counselor while you were in the hospital?  yes 7. Do you have an appointment to see a dietitian or nutritional counselor in the next month?  yes

## 2013-11-06 ENCOUNTER — Inpatient Hospital Stay (HOSPITAL_COMMUNITY): Payer: BC Managed Care – PPO

## 2013-11-06 DIAGNOSIS — R112 Nausea with vomiting, unspecified: Principal | ICD-10-CM | POA: Diagnosis present

## 2013-11-06 LAB — COMPREHENSIVE METABOLIC PANEL
ALT: 29 U/L (ref 0–35)
AST: 19 U/L (ref 0–37)
Albumin: 3.1 g/dL — ABNORMAL LOW (ref 3.5–5.2)
Alkaline Phosphatase: 71 U/L (ref 39–117)
Anion gap: 13 (ref 5–15)
BUN: 16 mg/dL (ref 6–23)
CO2: 24 mEq/L (ref 19–32)
Calcium: 8.9 mg/dL (ref 8.4–10.5)
Chloride: 105 mEq/L (ref 96–112)
Creatinine, Ser: 0.66 mg/dL (ref 0.50–1.10)
GFR calc Af Amer: >90 mL/min (ref >90)
GFR calc non Af Amer: >90 mL/min (ref >90)
Glucose, Bld: 101 mg/dL — ABNORMAL HIGH (ref 70–99)
Potassium: 4 mEq/L (ref 3.7–5.3)
Sodium: 142 mEq/L (ref 137–147)
Total Bilirubin: 0.3 mg/dL (ref 0.3–1.2)
Total Protein: 6.2 g/dL (ref 6.0–8.3)

## 2013-11-06 LAB — CBC WITH DIFFERENTIAL/PLATELET
Basophils Absolute: 0.1 10*3/uL (ref 0.0–0.1)
Basophils Relative: 1 % (ref 0–1)
Eosinophils Absolute: 0.5 10*3/uL (ref 0.0–0.7)
Eosinophils Relative: 7 % — ABNORMAL HIGH (ref 0–5)
HCT: 38 % (ref 36.0–46.0)
Hemoglobin: 12.1 g/dL (ref 12.0–15.0)
Lymphocytes Relative: 28 % (ref 12–46)
Lymphs Abs: 2.1 10*3/uL (ref 0.7–4.0)
MCH: 27.4 pg (ref 26.0–34.0)
MCHC: 31.8 g/dL (ref 30.0–36.0)
MCV: 86 fL (ref 78.0–100.0)
Monocytes Absolute: 0.9 10*3/uL (ref 0.1–1.0)
Monocytes Relative: 12 % (ref 3–12)
Neutro Abs: 4 10*3/uL (ref 1.7–7.7)
Neutrophils Relative %: 52 % (ref 43–77)
Platelets: 332 10*3/uL (ref 150–400)
RBC: 4.42 MIL/uL (ref 3.87–5.11)
RDW: 16.6 % — ABNORMAL HIGH (ref 11.5–15.5)
WBC: 7.6 10*3/uL (ref 4.0–10.5)

## 2013-11-06 MED ORDER — ENOXAPARIN SODIUM 30 MG/0.3ML ~~LOC~~ SOLN
30.0000 mg | Freq: Two times a day (BID) | SUBCUTANEOUS | Status: DC
  Administered 2013-11-06 – 2013-11-08 (×5): 30 mg via SUBCUTANEOUS
  Filled 2013-11-06 (×7): qty 0.3

## 2013-11-06 MED ORDER — METHYLPREDNISOLONE SODIUM SUCC 40 MG IJ SOLR
4.0000 mg | Freq: Every day | INTRAMUSCULAR | Status: DC
  Administered 2013-11-07 – 2013-11-09 (×3): 4 mg via INTRAVENOUS
  Filled 2013-11-06 (×3): qty 0.1

## 2013-11-06 MED ORDER — ENOXAPARIN SODIUM 40 MG/0.4ML ~~LOC~~ SOLN
40.0000 mg | SUBCUTANEOUS | Status: DC
  Administered 2013-11-06: 40 mg via SUBCUTANEOUS
  Filled 2013-11-06: qty 0.4

## 2013-11-06 MED ORDER — MORPHINE SULFATE 2 MG/ML IJ SOLN
2.0000 mg | INTRAMUSCULAR | Status: DC | PRN
  Administered 2013-11-06 – 2013-11-08 (×10): 4 mg via INTRAVENOUS
  Administered 2013-11-08: 2 mg via INTRAVENOUS
  Filled 2013-11-06 (×10): qty 2
  Filled 2013-11-06: qty 1

## 2013-11-06 MED ORDER — METHYLPREDNISOLONE SODIUM SUCC 40 MG IJ SOLR: 20.0000 mg | Freq: Every day | INTRAMUSCULAR | Status: DC

## 2013-11-06 MED ORDER — FENTANYL CITRATE 0.05 MG/ML IJ SOLN: 25.0000 ug | INTRAMUSCULAR | Status: DC | PRN

## 2013-11-06 MED ORDER — ALUM & MAG HYDROXIDE-SIMETH 200-200-20 MG/5ML PO SUSP
15.0000 mL | ORAL | Status: DC | PRN
  Administered 2013-11-08: 15 mL via ORAL
  Filled 2013-11-06: qty 30

## 2013-11-06 MED ORDER — PROMETHAZINE HCL 25 MG/ML IJ SOLN
12.5000 mg | Freq: Four times a day (QID) | INTRAMUSCULAR | Status: DC | PRN
  Administered 2013-11-06: 12.5 mg via INTRAVENOUS
  Filled 2013-11-06 (×2): qty 1

## 2013-11-06 NOTE — Progress Notes (Signed)
  Subjective: Pt reports onset of epigastric cramping on Sunday 2/10. With oral intake cramping increases to 7/10. Got to point couldn't drink water bc of discomfort. No f/c/L shoulder/LUQ pain. No hematemesis. Ice chips ok. No difference with room temp liquids  Objective: Vital signs in last 24 hours: Temp:  [97.8 F (36.6 C)-98.4 F (36.9 C)] 98.3 F (36.8 C) (09/15 1353) Pulse Rate:  [59-75] 73 (09/15 1353) Resp:  [16-18] 16 (09/15 1353) BP: (107-151)/(45-74) 147/66 mmHg (09/15 1353) SpO2:  [97 %-100 %] 100 % (09/15 1353) Weight:  [280 lb 6.4 oz (127.189 kg)-291 lb 3.6 oz (132.1 kg)] 280 lb 6.4 oz (127.189 kg) (09/15 0450) Last BM Date: 11/03/13  Intake/Output from previous day: 09/14 0701 - 09/15 0700 In: 761.7 [P.O.:60; I.V.:701.7] Out: 200 [Urine:200] Intake/Output this shift: Total I/O In: 910 [P.O.:10; I.V.:900] Out: 550 [Urine:550]  Alert, nad, nontoxic- doesn't appear ill cta bl Reg  Obese, soft, incisions c/d/i; with some bruising; mild epigastric TTP +SCDs, no edema  Lab Results:   Recent Labs  11/06/13 0500  WBC 7.6  HGB 12.1  HCT 38.0  PLT 332   BMET  Recent Labs  11/06/13 0500  NA 142  K 4.0  CL 105  CO2 24  GLUCOSE 101*  BUN 16  CREATININE 0.66  CALCIUM 8.9   PT/INR No results found for this basename: LABPROT, INR,  in the last 72 hours ABG No results found for this basename: PHART, PCO2, PO2, HCO3,  in the last 72 hours  Studies/Results: No results found.  Anti-infectives: Anti-infectives   None      Assessment/Plan: Principal Problem:   Nausea with vomiting Active Problems:   S/P laparoscopic sleeve gastrectomy  No fever, tachycardia, no wbc or left shift + clinically doesn't appear to have leak Cont antiemetics, IVF, pain meds as needed Bowel rest today, can have ice chips and sips of water as tolerated Will get PICC line since she has such difft peripheral IV access.  Start prophylactic chemical VTE prophylaxis Will  convert prednisone for RA from po to IV since not tolerating po  [name], MD, [name] General, Bariatric, & Minimally Invasive Surgery [facility], [place]     LOS: 1 day    [name] 11/06/2013

## 2013-11-06 NOTE — Progress Notes (Signed)
At bedside to place PICC/ultrasound assessment performed/measurement results determine that 5 french PICC catheter would be greater than 87% of vein occupancy/no veins large enough to place PICC/20G x 2inch peripheral IV obtained via ultrasound for CT exam/floor RN aware. Lorri Frederick, RN IV Team

## 2013-11-07 ENCOUNTER — Ambulatory Visit (INDEPENDENT_AMBULATORY_CARE_PROVIDER_SITE_OTHER): Payer: BC Managed Care – PPO | Admitting: General Surgery

## 2013-11-07 LAB — CBC WITH DIFFERENTIAL/PLATELET
Basophils Absolute: 0.1 10*3/uL (ref 0.0–0.1)
Basophils Relative: 1 % (ref 0–1)
Eosinophils Absolute: 0.5 10*3/uL (ref 0.0–0.7)
Eosinophils Relative: 6 % — ABNORMAL HIGH (ref 0–5)
HCT: 40.5 % (ref 36.0–46.0)
Hemoglobin: 12.6 g/dL (ref 12.0–15.0)
Lymphocytes Relative: 28 % (ref 12–46)
Lymphs Abs: 2.1 10*3/uL (ref 0.7–4.0)
MCH: 27.7 pg (ref 26.0–34.0)
MCHC: 31.1 g/dL (ref 30.0–36.0)
MCV: 89 fL (ref 78.0–100.0)
Monocytes Absolute: 0.9 10*3/uL (ref 0.1–1.0)
Monocytes Relative: 13 % — ABNORMAL HIGH (ref 3–12)
Neutro Abs: 3.8 10*3/uL (ref 1.7–7.7)
Neutrophils Relative %: 52 % (ref 43–77)
Platelets: 300 10*3/uL (ref 150–400)
RBC: 4.55 MIL/uL (ref 3.87–5.11)
RDW: 16.9 % — ABNORMAL HIGH (ref 11.5–15.5)
WBC: 7.3 10*3/uL (ref 4.0–10.5)

## 2013-11-07 MED ORDER — METOCLOPRAMIDE HCL 5 MG/ML IJ SOLN
10.0000 mg | Freq: Three times a day (TID) | INTRAMUSCULAR | Status: DC
  Administered 2013-11-07 – 2013-11-09 (×7): 10 mg via INTRAVENOUS
  Filled 2013-11-07 (×11): qty 2

## 2013-11-07 NOTE — Progress Notes (Signed)
  Subjective: Slept ok. Feels about the same to slightly better with respect to epigastric cramping and nausea. Definitely not worse. No emesis. ambulating  Objective: Vital signs in last 24 hours: Temp:  [98.1 F (36.7 C)-98.5 F (36.9 C)] 98.1 F (36.7 C) (09/16 0611) Pulse Rate:  [60-73] 60 (09/16 0611) Resp:  [16-17] 17 (09/16 0611) BP: (116-147)/(44-66) 120/44 mmHg (09/16 0611) SpO2:  [97 %-100 %] 99 % (09/16 0611) Weight:  [283 lb 6.4 oz (128.549 kg)] 283 lb 6.4 oz (128.549 kg) (09/16 0700) Last BM Date: 11/03/13  Intake/Output from previous day: 09/15 0701 - 09/16 0700 In: 2510 [P.O.:10; I.V.:2500] Out: 1300 [Urine:1300] Intake/Output this shift:    Alert, sitting up; doesn't appear ill, nontoxic cta b/l Reg Obese, soft, nd, incisions c/d/i; some mild epigastric TTP  Lab Results:   Recent Labs  11/06/13 0500  WBC 7.6  HGB 12.1  HCT 38.0  PLT 332   BMET  Recent Labs  11/06/13 0500  NA 142  K 4.0  CL 105  CO2 24  GLUCOSE 101*  BUN 16  CREATININE 0.66  CALCIUM 8.9   PT/INR No results found for this basename: LABPROT, INR,  in the last 72 hours ABG No results found for this basename: PHART, PCO2, PO2, HCO3,  in the last 72 hours  Studies/Results: No results found.  Anti-infectives: Anti-infectives   None      Assessment/Plan: Principal Problem:   Nausea with vomiting Active Problems:   HYPERTENSION   S/P laparoscopic sleeve gastrectomy   Rheumatoid arthritis  No fever, tachycardia, will check cbc today. No clinical signs of leak Pt had delayed gastric empyting on postop UGI. Will start reglan to see if it with symptoms Ice and water as tolerated Will start IV solumedrol since pt on chronic prednisone If po doesn't improve today or symptoms persist, will ask IR to place PICC and start TPN Cont VTE prophylaxis  [name], MD, [name] General, Bariatric, & Minimally Invasive Surgery [facility], [place]    LOS: 2 days     [name] 11/07/2013

## 2013-11-07 NOTE — Progress Notes (Signed)
Patient alert and oriented.  Provided support and encouragement.  Encouraged pulmonary toilet, ambulation and small sips of liquids.  All questions answered.  Will continue to monitor. 

## 2013-11-07 NOTE — Progress Notes (Signed)
Patient unable to tolerate liquids. Spoke with IV team and they said radiology would be able to place a PICC line it may need to be placed in her chest since her veins are so small. Still having pain and nausea consistently.

## 2013-11-08 MED ORDER — HYDROCODONE-ACETAMINOPHEN 7.5-325 MG/15ML PO SOLN
10.0000 mL | ORAL | Status: DC | PRN
  Administered 2013-11-08 (×2): 10 mL via ORAL
  Filled 2013-11-08 (×2): qty 15

## 2013-11-08 MED ORDER — OXYCODONE HCL 5 MG/5ML PO SOLN: 5.0000 mg | ORAL | Status: DC | PRN

## 2013-11-08 NOTE — Progress Notes (Signed)
  Subjective: No nausea; walking; when drinks liquids, will have some cramping in upper abdomen that lasts for few minutes then goes away. No pain/cramping at rest which is an improvement.   Objective: Vital signs in last 24 hours: Temp:  [97.8 F (36.6 C)-99.3 F (37.4 C)] 97.8 F (36.6 C) (09/17 0946) Pulse Rate:  [56-68] 65 (09/17 0946) Resp:  [16-18] 18 (09/17 0946) BP: (109-139)/(42-68) 111/48 mmHg (09/17 0946) SpO2:  [97 %-100 %] 100 % (09/17 0946) Weight:  [294 lb 8 oz (133.584 kg)] 294 lb 8 oz (133.584 kg) (09/17 0601) Last BM Date: 11/03/13  Intake/Output from previous day: 09/16 0701 - 09/17 0700 In: 2400 [I.V.:2400] Out: 500 [Urine:500] Intake/Output this shift: Total I/O In: 0  Out: 300 [Urine:300]  Looks great, good color. Smiling, laughing cta  rrr Obese, soft, nt nd No edema  Lab Results:   Recent Labs  11/06/13 0500 11/07/13 0800  WBC 7.6 7.3  HGB 12.1 12.6  HCT 38.0 40.5  PLT 332 300   BMET  Recent Labs  11/06/13 0500  NA 142  K 4.0  CL 105  CO2 24  GLUCOSE 101*  BUN 16  CREATININE 0.66  CALCIUM 8.9   PT/INR No results found for this basename: LABPROT, INR,  in the last 72 hours ABG No results found for this basename: PHART, PCO2, PO2, HCO3,  in the last 72 hours  Studies/Results: No results found.  Anti-infectives: Anti-infectives   None      Assessment/Plan: Principal Problem:   Nausea with vomiting Active Problems:   HYPERTENSION   S/P laparoscopic sleeve gastrectomy   Rheumatoid arthritis  Cont low dose solumedrol for RA Cont VTE prophylaxis N/V/Abd pain -improving, now not constant, only last a few minutes after oral intake. Cont reglan. Asked pt to premedicate with either oxycodone, maalox or both then trying PO. See how does today. No fever, tachycardia, or wbc, and no clinical sign of leak  [name], MD, [name] General, Bariatric, & Minimally Invasive Surgery [facility], [place]    LOS: 3  days    [name] 11/08/2013

## 2013-11-09 DIAGNOSIS — R1013 Epigastric pain: Secondary | ICD-10-CM | POA: Diagnosis present

## 2013-11-09 MED ORDER — ALUM & MAG HYDROXIDE-SIMETH 200-200-20 MG/5ML PO SUSP: 15.0000 mL | ORAL | Status: DC | PRN

## 2013-11-09 MED ORDER — INFLUENZA VAC SPLIT QUAD 0.5 ML IM SUSY
0.5000 mL | PREFILLED_SYRINGE | Freq: Once | INTRAMUSCULAR | Status: AC
  Administered 2013-11-09: 0.5 mL via INTRAMUSCULAR
  Filled 2013-11-09: qty 0.5

## 2013-11-09 MED ORDER — HYDROCODONE-ACETAMINOPHEN 7.5-325 MG/15ML PO SOLN: 10.0000 mL | ORAL | Status: DC | PRN

## 2013-11-09 NOTE — Progress Notes (Signed)
Patient alert and oriented, pain is controlled. Patient is tolerating fluids and protein shake.  Discussed diet progression and advancement as well as vitamin and mineral supplementation. All questions answered, will continue to monitor.

## 2013-11-09 NOTE — Discharge Instructions (Signed)

## 2013-11-09 NOTE — Discharge Summary (Signed)
Reviewed discharge instructions with pt and spouse including follow-up, medications, and precautions. Pt had no questions and verbalized understanding.  She was d/c via w/c to care of spouse.

## 2013-11-09 NOTE — Discharge Summary (Signed)
Physician Discharge Summary  [name] GUR:[id number] DOB: 1955-11-26 DOA: 11/05/2013  PCP: Eulas Post, MD  Admit date: 11/05/2013 Discharge date: 11/09/2013  Recommendations for Outpatient Follow-up:   Follow-up Information   Follow up with [name], MD. Schedule an appointment as soon as possible for a visit in 2 weeks.   Specialty:  General Surgery   Contact information:   [address] [place] [address] [phone]      Discharge Diagnoses:  Principal Problem:   Nausea with vomiting Active Problems:   HYPERTENSION   S/P laparoscopic sleeve gastrectomy   Rheumatoid arthritis   Epigastric cramping  Discharge Condition: fair Disposition: home  Diet recommendation: bariatric liquid diet  Filed Weights   11/06/13 0450 11/07/13 0700 11/08/13 0601  Weight: 280 lb 6.4 oz (127.189 kg) 283 lb 6.4 oz (128.549 kg) 294 lb 8 oz (133.584 kg)    History of present illness: 58 year old Caucasian female status post laparoscopic sleeve gastrectomy was readmitted from our urgent office on September 14 with complaints of oral intolerance. She had been doing well up to the day before readmission. She had stated that with any liquid intake she would get cramping in her epigastric area she also had regurgitation and nausea. She had been unable to keep anything down for 24-hour. She had denied any fevers or chills.  Hospital Course:  She was started on IV fluid resuscitation. She was given nausea medicine and pain medicine as needed. Electrolytes and labs were checked. She had no elevated white blood cell count. She had no fever or tachycardia. She had no clinical signs of a staple line leak. We placed her on stress ulcer prophylaxis as well as DVT prophylaxis. She was continued on prednisone via IV for her chronic rheumatoid arthritis. The epigastric pain which was always present which would worsen with liquid intake gradually eased off. It then became only  problematic when she would try to drink liquid and then  it would spasm for several minutes and then resolve. We started pre-medicating her with Maalox as well as oral hycet. With this regimen she was able to tolerate water as well as protein shakes without discomfort or intolerance. On day of discharge she had drank 8 ounces of protein shake as well as 8 ounces of liquid within the past 24 hours. She clinically looked great. She was deemed stable for discharge. She no longer was having the epigastric cramps or discomfort. We discussed discharge instructions and what to call for. She is scheduled to see the nutritionist next week for diet advancement however I advised her that I wanted her to keep on the liquid diet for little while longer. I also encouraged her to experiment with premedication with just taking the Maalox alone and trying to cut back on the oral narcotic. We discussed the possibility of constipation. All of her questions were asked and answered.  BP 110/75  Pulse 65  Temp(Src) 98.2 F (36.8 C) (Oral)  Resp 18  Ht 5\' 4"  (1.626 m)  Wt 294 lb 8 oz (133.584 kg)  BMI 50.53 kg/m2  SpO2 100%  Gen: alert, NAD, non-toxic appearing, looks great Pupils: equal, no scleral icterus Pulm: Lungs clear to auscultation, symmetric chest rise CV: regular rate and rhythm Abd: soft, nontender, nondistended. Well-healed trocar sites. No cellulitis. No incisional hernia Ext: no edema, no calf tenderness Skin: no rash, no jaundice    Discharge Instructions  Discharge Instructions   Discharge instructions    Complete by:  As directed   See bariatric discharge instructions. Premedicate with Maalox &/or hycet (oral pain medication) prior to oral intake. Try to use less pain medication as much as possible. Take nausea medication (zofran) as needed. Since taking oral narcotics, may need to take Miralax or milk of magnesia for constipation. If can't find hycet, can use oxycodone. Call for recurrent  symptoms. Please hold off on resuming methotrexate and enbrel to next week     Increase activity slowly    Complete by:  As directed             Medication List    STOP taking these medications       oxyCODONE-acetaminophen 5-325 MG per tablet  Commonly known as:  PERCOCET/ROXICET      TAKE these medications       acetaminophen 500 MG tablet  Commonly known as:  TYLENOL  Take 1,000 mg by mouth every 6 (six) hours as needed for pain.     alum & mag hydroxide-simeth 200-200-20 MG/5ML suspension  Commonly known as:  MAALOX/MYLANTA  Take 15 mLs by mouth every 4 (four) hours as needed for indigestion or heartburn.     B-complex with vitamin C tablet  Take 1 tablet by mouth daily.     calcium-vitamin D 500-200 MG-UNIT per tablet  Commonly known as:  OSCAL WITH D  Take 2 tablets by mouth 2 (two) times daily.     etanercept 50 MG/ML injection  Commonly known as:  ENBREL  Inject 0.98 mLs (50 mg total) into the skin once a week. Resume end of next week     FLINSTONES GUMMIES OMEGA-3 DHA PO  Take 4 tablets by mouth daily.     fluticasone 50 MCG/ACT nasal spray  Commonly known as:  FLONASE  Place 2 sprays into the nose daily as needed for allergies.     folic acid 1 MG tablet  Commonly known as:  FOLVITE  Take 2 mg by mouth daily.     HYDROcodone-acetaminophen 7.5-325 mg/15 ml solution  Commonly known as:  HYCET  Take 10 mLs by mouth every 3 (three) hours as needed for moderate pain.     lisinopril 20 MG tablet  Commonly known as:  PRINIVIL,ZESTRIL  Take 20 mg by mouth every morning.     methotrexate 1 gm Solr  Commonly known as:  50 mg/ml  May resume previous dose at  end of next week     omeprazole 20 MG capsule  Commonly known as:  PRILOSEC  Take 20 mg by mouth daily.     ondansetron 4 MG tablet  Commonly known as:  ZOFRAN  Take 1 tablet (4 mg total) by mouth every 8 (eight) hours as needed for nausea or vomiting.     pramipexole 0.125 MG tablet  Commonly  known as:  MIRAPEX  Take 0.375 mg by mouth at bedtime.     predniSONE 5 MG tablet  Commonly known as:  DELTASONE  Take 5 mg by mouth daily with breakfast.     sertraline 50 MG tablet  Commonly known as:  ZOLOFT  Take 50 mg by mouth every morning.     simvastatin 20 MG tablet  Commonly known as:  ZOCOR  Take 20 mg by mouth daily.     zolpidem 10 MG tablet  Commonly known as:  AMBIEN  Take 10 mg by mouth at bedtime as needed for sleep.           Follow-up Information  Follow up with [name], MD. Schedule an appointment as soon as possible for a visit in 2 weeks.   Specialty:  General Surgery   Contact information:   [address] [place] [address] [phone]        The results of significant diagnostics from this hospitalization (including imaging, microbiology, ancillary and laboratory) are listed below for reference.    Significant Diagnostic Studies: Dg Ugi W/water Sol Cm  10/31/2013   CLINICAL DATA:  Postop gastric sleeve.  EXAM: WATER SOLUBLE UPPER GI SERIES  TECHNIQUE: Single-column upper GI series was performed using water soluble contrast.  CONTRAST:  88mL OMNIPAQUE IOHEXOL 300 MG/ML  SOLN  COMPARISON:  07/11/2013 upper GI series  FLUOROSCOPY TIME:  1 min 46 seconds  FINDINGS: Scout radiograph demonstrates right upper quadrant surgical clips and left upper quadrant suture material. Gas is present nondilated loops of small and large bowel. Small amount of stool is present in the colon.  The patient drank small sips of contrast without difficulty. Contrast passed readily through the esophagus into the proximal stomach. Contrast initially collected in the proximal stomach with very slow progression into decompressed mid and distal stomach. Distal passage improved with more upright positioning. Contrast did pass into the proximal small bowel. There is no evidence of contrast leak.  IMPRESSION: 1. No evidence of leak. 2. Delayed passage of contrast from  the proximal stomach into the mid and distal stomach and small bowel, which may reflect gastric edema secondary to recent surgery.   Electronically Signed   By: Logan Bores   On: 10/31/2013 09:00    Microbiology: No results found for this or any previous visit (from the past 240 hour(s)).   Labs: Basic Metabolic Panel:  Recent Labs Lab 11/06/13 0500  NA 142  K 4.0  CL 105  CO2 24  GLUCOSE 101*  BUN 16  CREATININE 0.66  CALCIUM 8.9   Liver Function Tests:  Recent Labs Lab 11/06/13 0500  AST 19  ALT 29  ALKPHOS 71  BILITOT 0.3  PROT 6.2  ALBUMIN 3.1*   No results found for this basename: LIPASE, AMYLASE,  in the last 168 hours No results found for this basename: AMMONIA,  in the last 168 hours CBC:  Recent Labs Lab 11/06/13 0500 11/07/13 0800  WBC 7.6 7.3  NEUTROABS 4.0 3.8  HGB 12.1 12.6  HCT 38.0 40.5  MCV 86.0 89.0  PLT 332 300   Cardiac Enzymes: No results found for this basename: CKTOTAL, CKMB, CKMBINDEX, TROPONINI,  in the last 168 hours BNP: BNP (last 3 results) No results found for this basename: PROBNP,  in the last 8760 hours CBG: No results found for this basename: GLUCAP,  in the last 168 hours  Principal Problem:   Nausea with vomiting Active Problems:   HYPERTENSION   S/P laparoscopic sleeve gastrectomy   Rheumatoid arthritis   Epigastric cramping   Time coordinating discharge: 15 minutes  Signed:  [name], MD [facility], [place] [phone] 11/09/2013, 10:54 AM

## 2013-11-13 ENCOUNTER — Encounter: Payer: BC Managed Care – PPO | Attending: General Surgery

## 2013-11-13 VITALS — Ht 64.0 in | Wt 280.5 lb

## 2013-11-13 DIAGNOSIS — Z6841 Body Mass Index (BMI) 40.0 and over, adult: Secondary | ICD-10-CM | POA: Diagnosis not present

## 2013-11-13 DIAGNOSIS — Z713 Dietary counseling and surveillance: Secondary | ICD-10-CM | POA: Insufficient documentation

## 2013-11-13 NOTE — Progress Notes (Signed)
Bariatric Class:  Appt start time: 1530 end time:  1630.  2 Week Post-Operative Nutrition Class  Patient was seen on 11/13/13 for Post-Operative Nutrition education at [facility].   Surgery date: 10/30/2013 Surgery type: gastric sleeve Start weight at [facility]: 291.5 lbs on 07/28/13 Weight today: 280.5 lbs  Weight change: 17.5 lbs  TANITA  BODY COMP RESULTS  10/01/13 11/13/13   BMI (kg/m^2) 51.2 48.1   Fat Mass (lbs) 142.5 140.5   Fat Free Mass (lbs) 155.5 140.0   Total Body Water (lbs) 114 102.5    The following the learning objectives were met by the patient during this course:  Identifies Phase 3A (Soft, High Proteins) Dietary Goals and will begin from 2 weeks post-operatively to 2 months post-operatively  Identifies appropriate sources of fluids and proteins   States protein recommendations and appropriate sources post-operatively  Identifies the need for appropriate texture modifications, mastication, and bite sizes when consuming solids  Identifies appropriate multivitamin and calcium sources post-operatively  Describes the need for physical activity post-operatively and will follow MD recommendations  States when to call healthcare provider regarding medication questions or post-operative complications  Handouts given during class include:  Phase 3A: Soft, High Protein Diet Handout  Follow-Up Plan: Patient will follow-up at [facility] in 6 weeks for 2 month post-op nutrition visit for diet advancement per MD.

## 2013-11-13 NOTE — Patient Instructions (Signed)
Patient to follow Phase 3A-Soft, High Protein Diet and follow-up at [facility] in 6 weeks for 2 months post-op nutrition visit for diet advancement. 

## 2013-12-26 ENCOUNTER — Ambulatory Visit (INDEPENDENT_AMBULATORY_CARE_PROVIDER_SITE_OTHER): Payer: BC Managed Care – PPO | Admitting: Family Medicine

## 2013-12-26 ENCOUNTER — Encounter: Payer: Self-pay | Admitting: Family Medicine

## 2013-12-26 VITALS — BP 128/78 | HR 86 | Temp 97.7°F | Wt 258.0 lb

## 2013-12-26 DIAGNOSIS — Z9884 Bariatric surgery status: Secondary | ICD-10-CM

## 2013-12-26 DIAGNOSIS — I10 Essential (primary) hypertension: Secondary | ICD-10-CM

## 2013-12-26 NOTE — Progress Notes (Signed)
Subjective:    Patient ID: [name], female    DOB: 06-Jun-1955, 58 y.o.   MRN: [id number]  HPI   Patient seen for medical follow-up. She had lap band gastric bypass surgery September 8. She had some, patient of nausea vomiting and dehydration afterwards better after getting replete with IV fluids she has done extremely well. She feels much better is already lost about 40 pounds. She is walking a mile per day. No dizziness. No chest pains. She has occasional abdominal pains that she overeats but has been fair instructed in her intake. Overall, she is quite pleased. She is on multiple chronic medications as outlined. She had only mild anemia following her surgery.  Past Medical History  Diagnosis Date  . ANGIOMA 10/03/2008    REMOVED FROM RIGHT LOWER LEG-BENIGN  . HYPERLIPIDEMIA 08/27/2008  . DEPRESSION 12/10/2009  . HYPERTENSION 08/27/2008  . GERD 08/27/2008  . SEBORRHEIC KERATOSIS 08/27/2008  . Arthritis     RA  . Fibromyalgia   . Complication of anesthesia   . PONV (postoperative nausea and vomiting)   . History of nonmelanoma skin cancer   . Cancer     BASAL CELL SKIN CANCER   Past Surgical History  Procedure Laterality Date  . Cesarean section  1980  . Cesarean section  1982  . Cholecystectomy  1997  . Abdominal hysterectomy  1995  . Foot surgery  2004, 2005, 2008  . Knee arthroscopy  2007, 2208, 2009  . Nasal sinus surgery  2013  . Breast ductal system excision  08/16/2011    Procedure: EXCISION DUCTAL SYSTEM BREAST;  Surgeon: Joyice Faster. Cornett, MD;  Location: [facility];  Service: General;  Laterality: Left;  left breast duct excision  . Open surgical repair of gluteal tendon  01/17/2012    Procedure: OPEN SURGICAL REPAIR OF GLUTEAL TENDON;  Surgeon: Mauri Pole, MD;  Location: [facility];  Service: Orthopedics;  Laterality: Right;  . Joint replacement  10,12    lt total knee X2  . Laparoscopic gastric sleeve resection N/A 10/30/2013    Procedure:  LAPAROSCOPIC GASTRIC SLEEVE RESECTION WITH HIATAL HERNIA REPAIR AND UPPER ENDOSCOPY;  Surgeon: Greer Pickerel, MD;  Location: [facility];  Service: General;  Laterality: N/A;    reports that she quit smoking about 33 years ago. Her smoking use included Cigarettes. She has a 2.5 pack-year smoking history. She has never used smokeless tobacco. She reports that she does not drink alcohol or use illicit drugs. family history includes Cancer in her father, mother, and other; Diabetes in her other; Heart disease in her other; Hyperlipidemia in her other; Hypertension in her other; Kidney disease in her other. Allergies  Allergen Reactions  . Codeine Sulfate Other (See Comments)    GI upset  . Penicillins Rash      Review of Systems  Constitutional: Negative for appetite change and unexpected weight change.  Respiratory: Negative for cough and shortness of breath.   Cardiovascular: Negative for chest pain.  Gastrointestinal: Negative for nausea, vomiting and abdominal pain.       Objective:   Physical Exam  Constitutional: She appears well-developed and well-nourished.  Cardiovascular: Normal rate and regular rhythm.   Pulmonary/Chest: Effort normal and breath sounds normal. No respiratory distress. She has no wheezes. She has no rales.  Abdominal: Soft. Bowel sounds are normal. There is no tenderness.  Musculoskeletal: She exhibits no edema.          Assessment & Plan:  Status post gastric bypass surgery. Doing well without 40 pound weight loss and overall she feels much better.Will plan three-month labs with CBC, competence metabolic panel, ferritin level, B12, lipid panel, 25-hydroxy vitamin D, thiamine, folate, zinc, and copper levels. We'll plan routine medical follow-up 4 months  Hypertension which is stable and at goal. She is aware that we may be would eventually taper her off some of her medications.

## 2013-12-26 NOTE — Progress Notes (Signed)
Pre visit review using our clinic review tool, if applicable. No additional management support is needed unless otherwise documented below in the visit note. 

## 2013-12-31 ENCOUNTER — Encounter: Payer: BC Managed Care – PPO | Attending: General Surgery | Admitting: Dietician

## 2013-12-31 VITALS — Ht 64.0 in | Wt 257.5 lb

## 2013-12-31 DIAGNOSIS — Z6841 Body Mass Index (BMI) 40.0 and over, adult: Secondary | ICD-10-CM | POA: Insufficient documentation

## 2013-12-31 DIAGNOSIS — Z713 Dietary counseling and surveillance: Secondary | ICD-10-CM | POA: Insufficient documentation

## 2013-12-31 NOTE — Progress Notes (Signed)
  Follow-up visit:  8 Weeks Post-Operative Sleeve Gastrectomy Surgery  Medical Nutrition Therapy:  Appt start time: 1130 end time:  4801.  Primary concerns today: Post-operative Bariatric Surgery Nutrition Management. Returns with a 23 lbs weight loss. Feels like she is getting bored with her food. Getting in 2 protein shakes per day. Tolerating foods ok. Not feeling very hungry.   Surgery date: 10/30/2013 Surgery type: gastric sleeve Start weight at [facility]: 291.5 lbs on 07/28/13 Weight today: 257.5 lbs  Weight change: 23 lbs  TANITA BODY COMP RESULTS  10/01/13 11/13/13 01/10/14  BMI (kg/m^2) 51.2 48.1 44.2  Fat Mass (lbs) 142.5 140.5 130.0  Fat Free Mass (lbs) 155.5 140.0 127.5  Total Body Water (lbs) 114 102.5 93.5    Preferred Learning Style:   No preference indicated   Learning Readiness:   Ready  24-hr recall: B (AM):Premier Protein shake (30 g) Snk (AM): none L (PM): none Snk (PM): egg, cheese or grilled chicken/fish or squash (6-14g) D (PM): Protein Shake (30 g) Snk (PM): cheese stick or 1/3 cup cottage cheese (6-14 g)  Fluid intake: 22 oz fluid, 1 cup decaf coffee, or 50 oz water with flavor (about 80 oz) Estimated total protein intake: 66-88 g  Medications:  See list Supplementation: taking  Using straws: No Drinking while eating: No Hair loss: No Carbonated beverages: No N/V/D/C: vomited a few times when she overate or ate too quickly Dumping syndrome: No  Recent physical activity:  Walking everyday for 1 mile (15 minutes)  Progress Towards Goal(s):  In progress.  Handouts given during visit include:  Phase 3B High Protein + Non Starchy Vegetables   Nutritional Diagnosis:  [place]-3.3 Overweight/obesity related to past poor dietary habits and physical inactivity as evidenced by patient w/ recent sleeve gastrectomy surgery following dietary guidelines for continued weight loss.    Intervention:  Nutrition education/diet  advancement.  Teaching Method Utilized:  Visual Auditory Hands on  Barriers to learning/adherence to lifestyle change: none  Demonstrated degree of understanding via:  Teach Back   Monitoring/Evaluation:  Dietary intake, exercise, and body weight. Follow up in 1 months for 3 month post-op visit.

## 2013-12-31 NOTE — Patient Instructions (Signed)
Goals:  Follow Phase 3B: High Protein + Non-Starchy Vegetables  Eat 3-6 small meals/snacks, every 3-5 hrs  Increase lean protein foods to meet 60g goal  Try to eat at least 2 meals per day  Increase fluid intake to 64oz +  Avoid drinking 15 minutes before, during and 30 minutes after eating  Aim for >30 min of physical activity daily (think about adding exercise class)

## 2014-01-10 ENCOUNTER — Encounter: Payer: Self-pay | Admitting: Family Medicine

## 2014-02-05 ENCOUNTER — Other Ambulatory Visit (INDEPENDENT_AMBULATORY_CARE_PROVIDER_SITE_OTHER): Payer: BC Managed Care – PPO

## 2014-02-05 ENCOUNTER — Encounter: Payer: BC Managed Care – PPO | Attending: General Surgery | Admitting: Dietician

## 2014-02-05 VITALS — Ht 64.0 in | Wt 240.5 lb

## 2014-02-05 DIAGNOSIS — Z6841 Body Mass Index (BMI) 40.0 and over, adult: Secondary | ICD-10-CM | POA: Insufficient documentation

## 2014-02-05 DIAGNOSIS — Z713 Dietary counseling and surveillance: Secondary | ICD-10-CM | POA: Diagnosis not present

## 2014-02-05 DIAGNOSIS — D649 Anemia, unspecified: Secondary | ICD-10-CM

## 2014-02-05 DIAGNOSIS — Z9884 Bariatric surgery status: Secondary | ICD-10-CM

## 2014-02-05 LAB — LIPID PANEL
Cholesterol: 142 mg/dL (ref 0–200)
HDL: 54.8 mg/dL (ref >39.00)
LDL Cholesterol: 61 mg/dL (ref 0–99)
NonHDL: 87.2
Total CHOL/HDL Ratio: 3
Triglycerides: 132 mg/dL (ref 0.0–149.0)
VLDL: 26.4 mg/dL (ref 0.0–40.0)

## 2014-02-05 LAB — CBC WITH DIFFERENTIAL/PLATELET
Basophils Absolute: 0 10*3/uL (ref 0.0–0.1)
Basophils Relative: 0.6 % (ref 0.0–3.0)
Eosinophils Absolute: 0.1 10*3/uL (ref 0.0–0.7)
Eosinophils Relative: 1.3 % (ref 0.0–5.0)
HCT: 38.7 % (ref 36.0–46.0)
Hemoglobin: 12.4 g/dL (ref 12.0–15.0)
Lymphocytes Relative: 33.9 % (ref 12.0–46.0)
Lymphs Abs: 2.3 10*3/uL (ref 0.7–4.0)
MCHC: 32.1 g/dL (ref 30.0–36.0)
MCV: 91.2 fl (ref 78.0–100.0)
Monocytes Absolute: 0.6 10*3/uL (ref 0.1–1.0)
Monocytes Relative: 9 % (ref 3.0–12.0)
Neutro Abs: 3.8 10*3/uL (ref 1.4–7.7)
Neutrophils Relative %: 55.2 % (ref 43.0–77.0)
Platelets: 281 10*3/uL (ref 150.0–400.0)
RBC: 4.24 Mil/uL (ref 3.87–5.11)
RDW: 19.7 % — ABNORMAL HIGH (ref 11.5–15.5)
WBC: 6.9 10*3/uL (ref 4.0–10.5)

## 2014-02-05 LAB — BASIC METABOLIC PANEL
BUN: 19 mg/dL (ref 6–23)
CO2: 24 mEq/L (ref 19–32)
Calcium: 9.3 mg/dL (ref 8.4–10.5)
Chloride: 105 mEq/L (ref 96–112)
Creatinine, Ser: 0.6 mg/dL (ref 0.4–1.2)
GFR: 118.18 mL/min (ref >60.00)
Glucose, Bld: 90 mg/dL (ref 70–99)
Potassium: 3.5 mEq/L (ref 3.5–5.1)
Sodium: 137 mEq/L (ref 135–145)

## 2014-02-05 LAB — HEPATIC FUNCTION PANEL
ALT: 17 U/L (ref 0–35)
AST: 19 U/L (ref 0–37)
Albumin: 3.9 g/dL (ref 3.5–5.2)
Alkaline Phosphatase: 63 U/L (ref 39–117)
Bilirubin, Direct: 0 mg/dL (ref 0.0–0.3)
Total Bilirubin: 0.5 mg/dL (ref 0.2–1.2)
Total Protein: 6.5 g/dL (ref 6.0–8.3)

## 2014-02-05 LAB — FOLATE: Folate: 24.5 ng/mL (ref >5.9)

## 2014-02-05 LAB — VITAMIN D 25 HYDROXY (VIT D DEFICIENCY, FRACTURES): VITD: 32.7 ng/mL (ref 30.00–100.00)

## 2014-02-05 LAB — FERRITIN: Ferritin: 15.8 ng/mL (ref 10.0–291.0)

## 2014-02-05 LAB — VITAMIN B12: Vitamin B-12: 662 pg/mL (ref 211–911)

## 2014-02-05 NOTE — Patient Instructions (Addendum)
Goals:  Follow Phase 3B: High Protein + Non-Starchy Vegetables  Eat 3-6 small meals/snacks, every 3-5 hrs  Increase lean protein foods to meet 60g goal  Increase fluid intake to 64oz +  Avoid drinking 15 minutes before, during and 30 minutes after eating  Have about 15 grams of carbs per meal  TANITA BODY COMP RESULTS  10/01/13 11/13/13 01/10/14 02/05/14  BMI (kg/m^2) 51.2 48.1 44.2 41.3  Fat Mass (lbs) 142.5 140.5 130.0 113  Fat Free Mass (lbs) 155.5 140.0 127.5 127.5  Total Body Water (lbs) 114 102.5 93.5 93.5   

## 2014-02-05 NOTE — Progress Notes (Signed)
  Follow-up visit:  3 months Post-Operative Sleeve Gastrectomy Surgery  Medical Nutrition Therapy:  Appt start time: 1130 end time:  9163.  Primary concerns today: Post-operative Bariatric Surgery Nutrition Management.   [name] returns today with her husband having lost 17 lbs of fat. She reports eating protein foods first. Still not having feelings of hunger but feeling satisfied.   Surgery date: 10/30/2013 Surgery type: gastric sleeve Start weight at [facility]: 291.5 lbs on 07/28/13 Weight today: 240.5 lbs Weight change: 17 lbs Total weight lost: 51 lbs Goal weight: 160-199 lbs  TANITA BODY COMP RESULTS  10/01/13 11/13/13 01/10/14 02/05/14  BMI (kg/m^2) 51.2 48.1 44.2 41.3  Fat Mass (lbs) 142.5 140.5 130.0 113  Fat Free Mass (lbs) 155.5 140.0 127.5 127.5  Total Body Water (lbs) 114 102.5 93.5 93.5    Preferred Learning Style:   No preference indicated   Learning Readiness:   Ready  24-hr recall: B (AM):Premier Protein shake OR egg (6-30 g), coffee Snk (AM): none L (PM): 1.5 oz chicken or tilapia with vegetables or salad (10g) Snk (PM): cheese or cottage cheese or raw carrots (6-12g) D (PM): 1.5 oz meat with vegetables (10g) Snk (PM): cheese stick or 1/3 cup cottage cheese (6-14 g)  Fluid intake: 11 oz fluid, 1 cup decaf coffee and 50 oz water (about 60 oz) Estimated total protein intake: 60+ g  Medications:  See list Supplementation: taking  Using straws: No Drinking while eating: No Hair loss: No Carbonated beverages: No N/V/D/C: vomited a few times with Brussels sprouts and overeating; some constipation Dumping syndrome: No  Recent physical activity:  "Fit and strong" low impact aerobics 3x a week  Progress Towards Goal(s):  In progress.    Nutritional Diagnosis:  [place]-3.3 Overweight/obesity related to past poor dietary habits and physical inactivity as evidenced by patient w/ recent sleeve gastrectomy surgery following dietary guidelines for  continued weight loss.    Intervention:  Nutrition education/diet advancement.  Teaching Method Utilized:  Visual Auditory Hands on  Barriers to learning/adherence to lifestyle change: none  Demonstrated degree of understanding via:  Teach Back   Monitoring/Evaluation:  Dietary intake, exercise, and body weight. Follow up in 1 months for 4 month post-op visit.

## 2014-02-06 ENCOUNTER — Other Ambulatory Visit: Payer: BC Managed Care – PPO

## 2014-02-07 LAB — COPPER, SERUM: Copper: 108 ug/dL (ref 70–175)

## 2014-02-07 LAB — ZINC: Zinc: 68 ug/dL (ref 60–130)

## 2014-02-09 LAB — VITAMIN B1: Vitamin B1 (Thiamine): 8 nmol/L (ref 8–30)

## 2014-02-11 ENCOUNTER — Telehealth: Payer: Self-pay | Admitting: Family Medicine

## 2014-02-11 NOTE — Telephone Encounter (Signed)
Pt returning your call. pls call

## 2014-02-11 NOTE — Telephone Encounter (Signed)
Pt informed

## 2014-02-18 ENCOUNTER — Ambulatory Visit: Payer: BC Managed Care – PPO | Admitting: Family Medicine

## 2014-02-18 ENCOUNTER — Encounter: Payer: Self-pay | Admitting: Family Medicine

## 2014-02-19 ENCOUNTER — Other Ambulatory Visit: Payer: Self-pay

## 2014-02-19 MED ORDER — ZOLPIDEM TARTRATE 10 MG PO TABS: 10.0000 mg | ORAL_TABLET | Freq: Every evening | ORAL | Status: DC | PRN

## 2014-02-26 DIAGNOSIS — M79672 Pain in left foot: Secondary | ICD-10-CM | POA: Diagnosis not present

## 2014-02-26 DIAGNOSIS — M7062 Trochanteric bursitis, left hip: Secondary | ICD-10-CM | POA: Diagnosis not present

## 2014-02-26 DIAGNOSIS — M25552 Pain in left hip: Secondary | ICD-10-CM | POA: Diagnosis not present

## 2014-03-06 ENCOUNTER — Telehealth: Payer: Self-pay

## 2014-03-06 NOTE — Telephone Encounter (Signed)
Zolpidem #90  Last refill 02/19/14 #15 0 refill Last visit 12/26/13  [facility].

## 2014-03-06 NOTE — Telephone Encounter (Signed)
Refill OK

## 2014-03-07 MED ORDER — ZOLPIDEM TARTRATE 10 MG PO TABS: 10.0000 mg | ORAL_TABLET | Freq: Every evening | ORAL | Status: DC | PRN

## 2014-03-07 NOTE — Telephone Encounter (Signed)
Faxed Rx to pharmacy  

## 2014-03-13 ENCOUNTER — Encounter: Payer: BLUE CROSS/BLUE SHIELD | Attending: General Surgery | Admitting: Dietician

## 2014-03-13 VITALS — Ht 64.0 in | Wt 227.0 lb

## 2014-03-13 DIAGNOSIS — Z6841 Body Mass Index (BMI) 40.0 and over, adult: Secondary | ICD-10-CM | POA: Insufficient documentation

## 2014-03-13 DIAGNOSIS — Z713 Dietary counseling and surveillance: Secondary | ICD-10-CM | POA: Insufficient documentation

## 2014-03-13 NOTE — Progress Notes (Signed)
  Follow-up visit:  4 months Post-Operative Sleeve Gastrectomy Surgery  Medical Nutrition Therapy:  Appt start time: 3094 end time:  1130  Primary concerns today: Post-operative Bariatric Surgery Nutrition Management.   [name] returns today having lost another 13.5 pounds. She is meeting her fluid and protein needs but having a lot of regurgitation lately. Her vitamin B1 was on the low side in December at 8 nmol/L (normal range 8-30 nmol/L).  Surgery date: 10/30/2013 Surgery type: gastric sleeve Start weight at [facility]: 291.5 lbs on 07/28/13 Weight today: 227 lbs Weight change: 13.5 lbs Total weight lost: 64.5 lbs Goal weight: 160-199 lbs  TANITA BODY COMP RESULTS  10/01/13 11/13/13 01/10/14 02/05/14 03/13/14  BMI (kg/m^2) 51.2 48.1 44.2 41.3 39  Fat Mass (lbs) 142.5 140.5 130.0 113 115  Fat Free Mass (lbs) 155.5 140.0 127.5 127.5 111.5  Total Body Water (lbs) 114 102.5 93.5 93.5 81.5    Preferred Learning Style:   No preference indicated   Learning Readiness:   Ready  24-hr recall: B (AM):Premier Protein shake (30 g), coffee Snk (AM): none L (PM): chicken and vegetable soup Snk (PM): cottage cheese D (PM): shrimp salad OR fish and vegetables Snk (PM): cheese stick or 1/3 cup cottage cheese (6-14 g)  Fluid intake: 11 oz protein shake, 1 cup decaf coffee and 50 oz water (about 60 oz) Estimated total protein intake: 60+ g  Medications:  See list Supplementation: taking  Using straws: No Drinking while eating: No Hair loss: No Carbonated beverages: No  N/V/D/C: vomited a few days ago after baked fish and Brussel's sprouts Dumping syndrome: No  Recent physical activity:  "Fit and strong" low impact aerobics 3x a week and added situps   Progress Towards Goal(s):  In progress.    Nutritional Diagnosis:  [place]-3.3 Overweight/obesity related to past poor dietary habits and physical inactivity as evidenced by patient w/ recent sleeve gastrectomy surgery  following dietary guidelines for continued weight loss.    Intervention:  Nutrition education/diet advancement.  Teaching Method Utilized:  Visual Auditory Hands on  Barriers to learning/adherence to lifestyle change: none  Demonstrated degree of understanding via:  Teach Back   Monitoring/Evaluation:  Dietary intake, exercise, and body weight. Follow up in 2 months for 6 month post-op visit.

## 2014-03-13 NOTE — Patient Instructions (Signed)
Goals:  Follow Phase 3B: High Protein + Non-Starchy Vegetables  Eat 3-6 small meals/snacks, every 3-5 hrs  Increase lean protein foods to meet 60g goal  Increase fluid intake to 64oz +  Avoid drinking 15 minutes before, during and 30 minutes after eating  Have about 15 grams of carbs per meal  TANITA BODY COMP RESULTS  10/01/13 11/13/13 01/10/14 02/05/14  BMI (kg/m^2) 51.2 48.1 44.2 41.3  Fat Mass (lbs) 142.5 140.5 130.0 113  Fat Free Mass (lbs) 155.5 140.0 127.5 127.5  Total Body Water (lbs) 114 102.5 93.5 93.5

## 2014-03-27 DIAGNOSIS — M25552 Pain in left hip: Secondary | ICD-10-CM | POA: Diagnosis not present

## 2014-03-27 DIAGNOSIS — M79672 Pain in left foot: Secondary | ICD-10-CM | POA: Diagnosis not present

## 2014-03-27 DIAGNOSIS — M7062 Trochanteric bursitis, left hip: Secondary | ICD-10-CM | POA: Diagnosis not present

## 2014-03-29 ENCOUNTER — Ambulatory Visit: Payer: BLUE CROSS/BLUE SHIELD | Admitting: Family Medicine

## 2014-04-01 ENCOUNTER — Telehealth: Payer: Self-pay | Admitting: Family

## 2014-04-01 ENCOUNTER — Encounter: Payer: Self-pay | Admitting: Family Medicine

## 2014-04-01 DIAGNOSIS — J069 Acute upper respiratory infection, unspecified: Secondary | ICD-10-CM

## 2014-04-01 MED ORDER — ZITHROMAX 200 MG/5ML PO SUSR: ORAL | Status: DC

## 2014-04-01 NOTE — Progress Notes (Signed)
We are sorry that you are not feeling well.  Here is how we plan to help!  Based on what you have shared with me it looks like you have sinusitis.  Sinusitis is inflammation and infection in the sinus cavities of the head.  Based on your presentation I believe you most likely have Acute Bacterial sinusitis.  This is an infection caused by bacteria and is treated with antibiotics.  I have prescribed Azithromycin, an antibiotic that is not the penicillin family, 500mg  liquid once today, then 250mg  liquid  daily with food, for 4 more days.  You may use an oral decongestant such as Mucinex D or if you have glaucoma or high blood pressure use plain Mucinex.  Saline nasal sprays help an can sefely be used as often as needed for congestion.  If you develop worsening sinus pain, fever or notice severe headache and vision changes, or if symptoms are not better after completion of antibiotic, please schedule an appointment with a health care provider.  Sinus infections are not as easily transmitted as other respiratory infection, however we still recommend that you avoid close contact with loved ones, especially the very young and elderly.  Remember to wash your hands thoroughly throughout the day as this is the number one way to prevent the spread of infection!  Home Care:  Only take medications as instructed by your medical team.  Complete the entire course of an antibiotic.  Do not take these medications with alcohol.  A steam or ultrasonic humidifier can help congestion.  You can place a towel over your head and breathe in the steam from hot water coming from a faucet.  Avoid close contacts especially the very young and the elderly.  Cover your mouth when you cough or sneeze.  Always remember to wash your hands.  Get Help Right Away If:  You develop worsening fever or sinus pain.  You develop a severe head ache or visual changes.  Your symptoms persist after you have completed your treatment  plan.  Make sure you  Understand these instructions.  Will watch your condition.  Will get help right away if you are not doing well or get worse.  Your e-visit answers were reviewed by a board certified advanced clinical practitioner to complete your personal care plan.  Depending on the condition, your plan could have included both over the counter or prescription medications.  If there is a problem please reply  once you have received a response from your provider.  Your safety is important to [place].  If you have drug allergies check your prescription carefully.    You can use MyChart to ask questions about today's visit, request a non-urgent call back, or ask for a work or school excuse.  You will get an e-mail in the next two days asking about your experience.  I hope that your e-visit has been valuable and will speed your recovery. Thank you for using e-visits.

## 2014-04-02 ENCOUNTER — Other Ambulatory Visit: Payer: Self-pay

## 2014-04-02 MED ORDER — CEFUROXIME AXETIL 250 MG/5ML PO SUSR: 250.0000 mg | Freq: Two times a day (BID) | ORAL | Status: DC

## 2014-04-04 DIAGNOSIS — E669 Obesity, unspecified: Secondary | ICD-10-CM | POA: Diagnosis not present

## 2014-04-04 DIAGNOSIS — Z9889 Other specified postprocedural states: Secondary | ICD-10-CM | POA: Diagnosis not present

## 2014-04-04 DIAGNOSIS — I10 Essential (primary) hypertension: Secondary | ICD-10-CM | POA: Diagnosis not present

## 2014-04-04 DIAGNOSIS — E519 Thiamine deficiency, unspecified: Secondary | ICD-10-CM | POA: Diagnosis not present

## 2014-04-04 DIAGNOSIS — M1990 Unspecified osteoarthritis, unspecified site: Secondary | ICD-10-CM | POA: Diagnosis not present

## 2014-04-04 DIAGNOSIS — K219 Gastro-esophageal reflux disease without esophagitis: Secondary | ICD-10-CM | POA: Diagnosis not present

## 2014-04-09 DIAGNOSIS — M25552 Pain in left hip: Secondary | ICD-10-CM | POA: Diagnosis not present

## 2014-04-17 DIAGNOSIS — M0589 Other rheumatoid arthritis with rheumatoid factor of multiple sites: Secondary | ICD-10-CM | POA: Diagnosis not present

## 2014-04-17 DIAGNOSIS — M150 Primary generalized (osteo)arthritis: Secondary | ICD-10-CM | POA: Diagnosis not present

## 2014-04-17 DIAGNOSIS — Z79899 Other long term (current) drug therapy: Secondary | ICD-10-CM | POA: Diagnosis not present

## 2014-04-17 DIAGNOSIS — M797 Fibromyalgia: Secondary | ICD-10-CM | POA: Diagnosis not present

## 2014-04-19 DIAGNOSIS — Z9889 Other specified postprocedural states: Secondary | ICD-10-CM | POA: Diagnosis not present

## 2014-04-19 DIAGNOSIS — J329 Chronic sinusitis, unspecified: Secondary | ICD-10-CM | POA: Diagnosis not present

## 2014-04-19 DIAGNOSIS — J0100 Acute maxillary sinusitis, unspecified: Secondary | ICD-10-CM | POA: Diagnosis not present

## 2014-04-26 ENCOUNTER — Ambulatory Visit: Payer: BC Managed Care – PPO | Admitting: Family Medicine

## 2014-04-30 ENCOUNTER — Ambulatory Visit (INDEPENDENT_AMBULATORY_CARE_PROVIDER_SITE_OTHER): Payer: BLUE CROSS/BLUE SHIELD | Admitting: Family Medicine

## 2014-04-30 ENCOUNTER — Encounter: Payer: Self-pay | Admitting: Family Medicine

## 2014-04-30 VITALS — BP 120/70 | HR 69 | Temp 98.0°F | Wt 220.0 lb

## 2014-04-30 DIAGNOSIS — I10 Essential (primary) hypertension: Secondary | ICD-10-CM | POA: Diagnosis not present

## 2014-04-30 DIAGNOSIS — E785 Hyperlipidemia, unspecified: Secondary | ICD-10-CM

## 2014-04-30 NOTE — Progress Notes (Signed)
Subjective:    Patient ID: [name], female    DOB: [birth year], 59 y.o.   MRN: [id number]  HPI Patient has history of morbid obesity. She had gastric sleeve surgery last fall and has lost about 87 pounds. She has lost 38 pounds since last visit here 4 months ago. She is doing extremely well. She is walking one and 1/2 miles per day. Sometimes limited by hip pain related to her rheumatoid arthritis. Overall, her stamina is improving. She has hypertension treated with lisinopril 20 mg daily. She inquires about whether we might be able to taper this back. No recent dizziness. She takes simvastatin for hyperlipidemia. Lipids were very well controlled last fall. No history of CAD or peripheral vascular disease  Past Medical History  Diagnosis Date  . ANGIOMA 10/03/2008    REMOVED FROM RIGHT LOWER LEG-BENIGN  . HYPERLIPIDEMIA 08/27/2008  . DEPRESSION 12/10/2009  . HYPERTENSION 08/27/2008  . GERD 08/27/2008  . SEBORRHEIC KERATOSIS 08/27/2008  . Arthritis     RA  . Fibromyalgia   . Complication of anesthesia   . PONV (postoperative nausea and vomiting)   . History of nonmelanoma skin cancer   . Cancer     BASAL CELL SKIN CANCER   Past Surgical History  Procedure Laterality Date  . Cesarean section  1980  . Cesarean section  1982  . Cholecystectomy  1997  . Abdominal hysterectomy  1995  . Foot surgery  2004, 2005, 2008  . Knee arthroscopy  2007, 2208, 2009  . Nasal sinus surgery  2013  . Breast ductal system excision  08/16/2011    Procedure: EXCISION DUCTAL SYSTEM BREAST;  Surgeon: Joyice Faster. Cornett, MD;  Location: [place];  Service: General;  Laterality: Left;  left breast duct excision  . Open surgical repair of gluteal tendon  01/17/2012    Procedure: OPEN SURGICAL REPAIR OF GLUTEAL TENDON;  Surgeon: Mauri Pole, MD;  Location: [facility];  Service: Orthopedics;  Laterality: Right;  . Joint replacement  10,12    lt total knee X2  . Laparoscopic gastric sleeve  resection N/A 10/30/2013    Procedure: LAPAROSCOPIC GASTRIC SLEEVE RESECTION WITH HIATAL HERNIA REPAIR AND UPPER ENDOSCOPY;  Surgeon: Greer Pickerel, MD;  Location: [facility];  Service: General;  Laterality: N/A;    reports that she quit smoking about 34 years ago. Her smoking use included Cigarettes. She has a 2.5 pack-year smoking history. She has never used smokeless tobacco. She reports that she does not drink alcohol or use illicit drugs. family history includes Cancer in her father, mother, and other; Diabetes in her other; Heart disease in her other; Hyperlipidemia in her other; Hypertension in her other; Kidney disease in her other. Allergies  Allergen Reactions  . Codeine Sulfate Other (See Comments)    GI upset  . Penicillins Rash      Review of Systems  Constitutional: Negative for fatigue.  Eyes: Negative for visual disturbance.  Respiratory: Negative for cough, chest tightness, shortness of breath and wheezing.   Cardiovascular: Negative for chest pain, palpitations and leg swelling.  Endocrine: Negative for polydipsia and polyuria.  Neurological: Negative for dizziness, seizures, syncope, weakness, light-headedness and headaches.       Objective:   Physical Exam  Constitutional: She appears well-developed and well-nourished.  Neck: Neck supple.  Cardiovascular: Normal rate and regular rhythm.  Exam reveals no gallop.   Pulmonary/Chest: Effort normal and breath sounds normal. No respiratory distress. She has no wheezes. She  has no rales.  Musculoskeletal: She exhibits no edema.          Assessment & Plan:  #1 hypertension. Well controlled. With recent weight loss efforts reduce lisinopril 10 mg once daily. Reassess in 4 months. If blood pressure stable that point consider stopping altogether #2 dyslipidemia. Discontinue simvastatin. We'll plan repeat fasting lipids in 4 months

## 2014-04-30 NOTE — Patient Instructions (Signed)
Decrease the Lisinopril to one half tablet daily Stop the Simvastatin and let's plan to repeat lipids in 3-4 months.

## 2014-04-30 NOTE — Progress Notes (Signed)
Pre visit review using our clinic review tool, if applicable. No additional management support is needed unless otherwise documented below in the visit note. 

## 2014-05-01 DIAGNOSIS — M25552 Pain in left hip: Secondary | ICD-10-CM | POA: Diagnosis not present

## 2014-05-02 DIAGNOSIS — J310 Chronic rhinitis: Secondary | ICD-10-CM | POA: Diagnosis not present

## 2014-05-13 ENCOUNTER — Ambulatory Visit: Payer: Medicare Other | Admitting: Dietician

## 2014-05-21 DIAGNOSIS — D485 Neoplasm of uncertain behavior of skin: Secondary | ICD-10-CM | POA: Diagnosis not present

## 2014-05-21 DIAGNOSIS — Z85828 Personal history of other malignant neoplasm of skin: Secondary | ICD-10-CM | POA: Diagnosis not present

## 2014-05-21 DIAGNOSIS — L814 Other melanin hyperpigmentation: Secondary | ICD-10-CM | POA: Diagnosis not present

## 2014-05-21 DIAGNOSIS — L570 Actinic keratosis: Secondary | ICD-10-CM | POA: Diagnosis not present

## 2014-05-28 ENCOUNTER — Encounter: Payer: Self-pay | Admitting: Family Medicine

## 2014-05-28 ENCOUNTER — Encounter: Payer: BLUE CROSS/BLUE SHIELD | Attending: General Surgery | Admitting: Dietician

## 2014-05-28 VITALS — Ht 64.0 in | Wt 214.5 lb

## 2014-05-28 DIAGNOSIS — Z713 Dietary counseling and surveillance: Secondary | ICD-10-CM | POA: Insufficient documentation

## 2014-05-28 DIAGNOSIS — Z6841 Body Mass Index (BMI) 40.0 and over, adult: Secondary | ICD-10-CM | POA: Diagnosis not present

## 2014-05-28 NOTE — Patient Instructions (Signed)
Goals:  Follow Phase 3B: High Protein + Non-Starchy Vegetables  Eat 3-6 small meals/snacks, every 3-5 hrs  Increase lean protein foods to meet 60g goal  Increase fluid intake to 64oz +  Avoid drinking 15 minutes before, during and 30 minutes after eating  Have about 15 grams of carbs per meal  TANITA BODY COMP RESULTS  10/01/13 11/13/13 01/10/14 02/05/14 03/13/14 05/28/14  BMI (kg/m^2) 51.2 48.1 44.2 41.3 39 36.8  Fat Mass (lbs) 142.5 140.5 130.0 113 115 98  Fat Free Mass (lbs) 155.5 140.0 127.5 127.5 111.5 116.5  Total Body Water (lbs) 114 102.5 93.5 93.5 81.5 85.5

## 2014-05-28 NOTE — Progress Notes (Signed)
  Follow-up visit:  7 months Post-Operative Sleeve Gastrectomy Surgery  Medical Nutrition Therapy:  Appt start time: 315 end time:  335  Primary concerns today: Post-operative Bariatric Surgery Nutrition Management.   [name] returns today having lost another 12.5 pounds. She is meeting her fluid and protein needs and exercising regularly.  Surgery date: 10/30/2013 Surgery type: gastric sleeve Start weight at [facility]: 291.5 lbs on 07/28/13 Weight today: 214.5 lbs Weight change: 12.5 lbs Total weight lost: 77 lbs Goal weight: 160-199 lbs  TANITA BODY COMP RESULTS  10/01/13 11/13/13 01/10/14 02/05/14 03/13/14 05/28/14  BMI (kg/m^2) 51.2 48.1 44.2 41.3 39 36.8  Fat Mass (lbs) 142.5 140.5 130.0 113 115 98  Fat Free Mass (lbs) 155.5 140.0 127.5 127.5 111.5 116.5  Total Body Water (lbs) 114 102.5 93.5 93.5 81.5 85.5    Preferred Learning Style:   No preference indicated   Learning Readiness:   Ready  24-hr recall: B (AM): Premier Protein shake (30 g), coffee Snk (AM): none L (PM): 3 oz chicken and salad (21g) Snk (PM): cottage cheese or cheese stick or shake (7-30g) D (PM): 3 oz protein and vegetables (21g) Snk (PM): cheese stick or 1/3 cup cottage cheese (6-14 g)  Fluid intake: 11 oz protein shake, 1 cup decaf coffee and 50 oz water (about 64 oz per patient estimate) Estimated total protein intake: 60+ g  Medications:  See list Supplementation: taking (taking only 2 calcium per day)  Using straws: No Drinking while eating: No Hair loss: yes, taking biotin and folic acid Carbonated beverages: No  N/V/D/C: none Dumping syndrome: No  Recent physical activity:  "Fit and strong" low impact aerobics 3x a week and added situps; walking almost daily   Progress Towards Goal(s):  In progress.    Nutritional Diagnosis:  [place]ulk-3.3 Overweight/obesity related to past poor dietary habits and physical inactivity as evidenced by patient w/ recent sleeve gastrectomy surgery  following dietary guidelines for continued weight loss.    Intervention:  Nutrition education/diet advancement.  Teaching Method Utilized:  Visual Auditory Hands on  Barriers to learning/adherence to lifestyle change: none  Demonstrated degree of understanding via:  Teach Back   Monitoring/Evaluation:  Dietary intake, exercise, and body weight. Follow up in 3 months for 10 month post-op visit.

## 2014-05-30 ENCOUNTER — Other Ambulatory Visit: Payer: Self-pay

## 2014-05-30 MED ORDER — ESTRADIOL 1 MG PO TABS: 1.0000 mg | ORAL_TABLET | Freq: Every day | ORAL | Status: DC

## 2014-06-04 ENCOUNTER — Other Ambulatory Visit: Payer: Self-pay | Admitting: Family Medicine

## 2014-06-05 ENCOUNTER — Other Ambulatory Visit: Payer: Self-pay

## 2014-06-05 MED ORDER — ZOLPIDEM TARTRATE 10 MG PO TABS: 10.0000 mg | ORAL_TABLET | Freq: Every evening | ORAL | Status: DC | PRN

## 2014-06-12 DIAGNOSIS — Z471 Aftercare following joint replacement surgery: Secondary | ICD-10-CM | POA: Diagnosis not present

## 2014-06-12 DIAGNOSIS — M7062 Trochanteric bursitis, left hip: Secondary | ICD-10-CM | POA: Diagnosis not present

## 2014-06-12 DIAGNOSIS — Z96652 Presence of left artificial knee joint: Secondary | ICD-10-CM | POA: Diagnosis not present

## 2014-06-12 DIAGNOSIS — M7061 Trochanteric bursitis, right hip: Secondary | ICD-10-CM | POA: Diagnosis not present

## 2014-06-14 ENCOUNTER — Other Ambulatory Visit: Payer: Self-pay | Admitting: Family Medicine

## 2014-07-03 ENCOUNTER — Other Ambulatory Visit: Payer: Self-pay

## 2014-07-03 DIAGNOSIS — Z1231 Encounter for screening mammogram for malignant neoplasm of breast: Secondary | ICD-10-CM

## 2014-07-04 DIAGNOSIS — Z6841 Body Mass Index (BMI) 40.0 and over, adult: Secondary | ICD-10-CM | POA: Diagnosis not present

## 2014-07-04 DIAGNOSIS — Z9889 Other specified postprocedural states: Secondary | ICD-10-CM | POA: Diagnosis not present

## 2014-07-08 ENCOUNTER — Encounter: Payer: Self-pay | Admitting: Family Medicine

## 2014-07-08 ENCOUNTER — Ambulatory Visit (INDEPENDENT_AMBULATORY_CARE_PROVIDER_SITE_OTHER): Payer: BLUE CROSS/BLUE SHIELD | Admitting: Family Medicine

## 2014-07-08 ENCOUNTER — Ambulatory Visit (INDEPENDENT_AMBULATORY_CARE_PROVIDER_SITE_OTHER)
Admission: RE | Admit: 2014-07-08 | Discharge: 2014-07-08 | Disposition: A | Discharge status: Home / Self Care | Payer: BLUE CROSS/BLUE SHIELD | Source: Ambulatory Visit | Attending: Family Medicine | Admitting: Family Medicine

## 2014-07-08 VITALS — BP 116/72 | HR 75 | Temp 98.1°F | Wt 209.0 lb

## 2014-07-08 DIAGNOSIS — R109 Unspecified abdominal pain: Secondary | ICD-10-CM | POA: Diagnosis not present

## 2014-07-08 DIAGNOSIS — R3129 Other microscopic hematuria: Secondary | ICD-10-CM

## 2014-07-08 DIAGNOSIS — R312 Other microscopic hematuria: Secondary | ICD-10-CM

## 2014-07-08 LAB — POCT URINALYSIS DIPSTICK
Bilirubin, UA: NEGATIVE
Glucose, UA: NEGATIVE
Leukocytes, UA: NEGATIVE
Nitrite, UA: NEGATIVE
Protein, UA: NEGATIVE
Spec Grav, UA: 1.025
Urobilinogen, UA: 0.2
pH, UA: 5.5

## 2014-07-08 MED ORDER — ONDANSETRON 8 MG PO TBDP: 8.0000 mg | ORAL_TABLET | Freq: Three times a day (TID) | ORAL | Status: DC | PRN

## 2014-07-08 NOTE — Progress Notes (Signed)
Pre visit review using our clinic review tool, if applicable. No additional management support is needed unless otherwise documented below in the visit note. 

## 2014-07-08 NOTE — Progress Notes (Signed)
Subjective:    Patient ID: [name], female    DOB: Feb 26, 1955, 59 y.o.   MRN: [id number]  HPI Acute visit. Patient seen with left flank pain. She describes a dull to sharp pain which radiates from her left flank area anterior. She's not noted any rash. Onset was about 3 days ago. Pain is currently 5 out of 10 intensity and up to 9 out of 10 at times. No exacerbating or alleviating factors. She tried some Tylenol which did not help. She's had occasional nausea with severe pain but no vomiting. No stool changes. No dysuria. No gross hematuria. No history of kidney stones. She had gastric bypass surgery last year and has done very well since then. She's had some gradual weight loss since then.  Past Medical History  Diagnosis Date  . ANGIOMA 10/03/2008    REMOVED FROM RIGHT LOWER LEG-BENIGN  . HYPERLIPIDEMIA 08/27/2008  . DEPRESSION 12/10/2009  . HYPERTENSION 08/27/2008  . GERD 08/27/2008  . SEBORRHEIC KERATOSIS 08/27/2008  . Arthritis     RA  . Fibromyalgia   . Complication of anesthesia   . PONV (postoperative nausea and vomiting)   . History of nonmelanoma skin cancer   . Cancer     BASAL CELL SKIN CANCER   Past Surgical History  Procedure Laterality Date  . Cesarean section  1980  . Cesarean section  1982  . Cholecystectomy  1997  . Abdominal hysterectomy  1995  . Foot surgery  2004, 2005, 2008  . Knee arthroscopy  2007, 2208, 2009  . Nasal sinus surgery  2013  . Breast ductal system excision  08/16/2011    Procedure: EXCISION DUCTAL SYSTEM BREAST;  Surgeon: Joyice Faster. Cornett, MD;  Location: [place];  Service: General;  Laterality: Left;  left breast duct excision  . Open surgical repair of gluteal tendon  01/17/2012    Procedure: OPEN SURGICAL REPAIR OF GLUTEAL TENDON;  Surgeon: Mauri Pole, MD;  Location: [facility];  Service: Orthopedics;  Laterality: Right;  . Joint replacement  10,12    lt total knee X2  . Laparoscopic gastric sleeve resection N/A  10/30/2013    Procedure: LAPAROSCOPIC GASTRIC SLEEVE RESECTION WITH HIATAL HERNIA REPAIR AND UPPER ENDOSCOPY;  Surgeon: Greer Pickerel, MD;  Location: [facility];  Service: General;  Laterality: N/A;    reports that she quit smoking about 34 years ago. Her smoking use included Cigarettes. She has a 2.5 pack-year smoking history. She has never used smokeless tobacco. She reports that she does not drink alcohol or use illicit drugs. family history includes Cancer in her father, mother, and other; Diabetes in her other; Heart disease in her other; Hyperlipidemia in her other; Hypertension in her other; Kidney disease in her other. Allergies  Allergen Reactions  . Codeine Sulfate Other (See Comments)    GI upset  . Penicillins Rash      Review of Systems  Constitutional: Negative for fever and chills.  Respiratory: Negative for cough and shortness of breath.   Cardiovascular: Negative for chest pain.  Gastrointestinal: Positive for nausea. Negative for vomiting, diarrhea, constipation and blood in stool.  Genitourinary: Negative for dysuria.       Objective:   Physical Exam  Constitutional: She appears well-developed and well-nourished. No distress.  Cardiovascular: Normal rate and regular rhythm.   Pulmonary/Chest: Effort normal and breath sounds normal. No respiratory distress. She has no wheezes. She has no rales.  Abdominal: Soft. She exhibits no distension and no  mass. There is tenderness. There is no rebound and no guarding.  She has some mild tenderness left upper quadrant. No masses. No splenomegaly or hepatomegaly noted  Skin: No rash noted.          Assessment & Plan:  Left flank pain. Doubt musculoskeletal as this is not triggered by movement. Question early shingles but no rash visible this point. Check urinalysis-shows microscopic hematuria. Would definitely consider nephrolithiasis and differential. Patient is in no distress this time. Start with plain film of abdomen. She is  aware that this may require CT scan to further assess for kidney stone. She has Percocet at home to use as needed for pain. Wrote prescription for Zofran 8 mg every 8 hours as needed for nausea. Stay well-hydrated. Reassess one week and sooner for any fever or worsening pain

## 2014-07-08 NOTE — Patient Instructions (Signed)
Go and get abdominal film Stay well hydrated Follow up promptly for any fever, recurrent nausea and vomiting, or increasing pain.

## 2014-07-10 ENCOUNTER — Ambulatory Visit
Admission: RE | Admit: 2014-07-10 | Discharge: 2014-07-10 | Disposition: A | Discharge status: Home / Self Care | Payer: Medicare Other | Source: Ambulatory Visit

## 2014-07-10 DIAGNOSIS — Z1231 Encounter for screening mammogram for malignant neoplasm of breast: Secondary | ICD-10-CM

## 2014-07-15 ENCOUNTER — Encounter: Payer: Self-pay | Admitting: Family Medicine

## 2014-07-15 ENCOUNTER — Ambulatory Visit (INDEPENDENT_AMBULATORY_CARE_PROVIDER_SITE_OTHER): Payer: BLUE CROSS/BLUE SHIELD | Admitting: Family Medicine

## 2014-07-15 VITALS — BP 128/70 | HR 61 | Temp 97.6°F | Wt 208.0 lb

## 2014-07-15 DIAGNOSIS — R300 Dysuria: Secondary | ICD-10-CM | POA: Diagnosis not present

## 2014-07-15 LAB — POCT URINALYSIS DIPSTICK
Bilirubin, UA: NEGATIVE
Glucose, UA: NEGATIVE
Leukocytes, UA: NEGATIVE
Nitrite, UA: NEGATIVE
Protein, UA: NEGATIVE
Spec Grav, UA: <=1.005
Urobilinogen, UA: 0.2
pH, UA: 5

## 2014-07-15 LAB — URINALYSIS, MICROSCOPIC ONLY

## 2014-07-15 NOTE — Patient Instructions (Signed)

## 2014-07-15 NOTE — Progress Notes (Signed)
Subjective:    Patient ID: [name], female    DOB: 11/23/1955, 59 y.o.   MRN: [id number]  HPI Patient seen for follow-up regarding recent left flank pain. Refer to recent note. We suspected possible kidney stone. Plain film did not reveal any obvious abnormalities. Her pain is essentially resolved at this time. She has not had any gross hematuria but had 3+ blood on urine dipstick. No personal history of kidney stones. She does have a sister who had kidney stones. She also states there is a family history of Bright disease.  She has not noted any skin rashes. No fevers or chills  Past Medical History  Diagnosis Date  . ANGIOMA 10/03/2008    REMOVED FROM RIGHT LOWER LEG-BENIGN  . HYPERLIPIDEMIA 08/27/2008  . DEPRESSION 12/10/2009  . HYPERTENSION 08/27/2008  . GERD 08/27/2008  . SEBORRHEIC KERATOSIS 08/27/2008  . Arthritis     RA  . Fibromyalgia   . Complication of anesthesia   . PONV (postoperative nausea and vomiting)   . History of nonmelanoma skin cancer   . Cancer     BASAL CELL SKIN CANCER   Past Surgical History  Procedure Laterality Date  . Cesarean section  1980  . Cesarean section  1982  . Cholecystectomy  1997  . Abdominal hysterectomy  1995  . Foot surgery  2004, 2005, 2008  . Knee arthroscopy  2007, 2208, 2009  . Nasal sinus surgery  2013  . Breast ductal system excision  08/16/2011    Procedure: EXCISION DUCTAL SYSTEM BREAST;  Surgeon: Joyice Faster. Cornett, MD;  Location: [place];  Service: General;  Laterality: Left;  left breast duct excision  . Open surgical repair of gluteal tendon  01/17/2012    Procedure: OPEN SURGICAL REPAIR OF GLUTEAL TENDON;  Surgeon: Mauri Pole, MD;  Location: [facility];  Service: Orthopedics;  Laterality: Right;  . Joint replacement  10,12    lt total knee X2  . Laparoscopic gastric sleeve resection N/A 10/30/2013    Procedure: LAPAROSCOPIC GASTRIC SLEEVE RESECTION WITH HIATAL HERNIA REPAIR AND UPPER ENDOSCOPY;  Surgeon:  Greer Pickerel, MD;  Location: [facility];  Service: General;  Laterality: N/A;    reports that she quit smoking about 34 years ago. Her smoking use included Cigarettes. She has a 2.5 pack-year smoking history. She has never used smokeless tobacco. She reports that she does not drink alcohol or use illicit drugs. family history includes Cancer in her father, mother, and other; Diabetes in her other; Heart disease in her other; Hyperlipidemia in her other; Hypertension in her other; Kidney disease in her other. Allergies  Allergen Reactions  . Codeine Sulfate Other (See Comments)    GI upset  . Penicillins Rash      Review of Systems  Constitutional: Negative for fever and chills.  Gastrointestinal: Negative for abdominal pain.  Musculoskeletal: Negative for back pain.  Skin: Negative for rash.       Objective:   Physical Exam  Constitutional: She appears well-developed and well-nourished.  Cardiovascular: Normal rate and regular rhythm.   Pulmonary/Chest: Effort normal and breath sounds normal. No respiratory distress. She has no wheezes. She has no rales.  Musculoskeletal:  No flank tenderness  Skin: No rash noted.          Assessment & Plan:  Recent left flank pain. Symptoms resolved. Urine dipstick again today shows 3+ blood but no leukocytes and no nitrites. Send urine micro-. If greater than 3 red cells  per high-power field, urology referral

## 2014-07-15 NOTE — Progress Notes (Signed)
Pre visit review using our clinic review tool, if applicable. No additional management support is needed unless otherwise documented below in the visit note. 

## 2014-07-16 ENCOUNTER — Other Ambulatory Visit: Payer: Self-pay | Admitting: Family Medicine

## 2014-07-16 DIAGNOSIS — R319 Hematuria, unspecified: Secondary | ICD-10-CM

## 2014-07-17 DIAGNOSIS — M797 Fibromyalgia: Secondary | ICD-10-CM | POA: Diagnosis not present

## 2014-07-17 DIAGNOSIS — Z79899 Other long term (current) drug therapy: Secondary | ICD-10-CM | POA: Diagnosis not present

## 2014-07-17 DIAGNOSIS — M150 Primary generalized (osteo)arthritis: Secondary | ICD-10-CM | POA: Diagnosis not present

## 2014-07-17 DIAGNOSIS — M0589 Other rheumatoid arthritis with rheumatoid factor of multiple sites: Secondary | ICD-10-CM | POA: Diagnosis not present

## 2014-07-18 ENCOUNTER — Encounter: Payer: Self-pay | Admitting: Family Medicine

## 2014-07-31 DIAGNOSIS — M7061 Trochanteric bursitis, right hip: Secondary | ICD-10-CM | POA: Diagnosis not present

## 2014-07-31 DIAGNOSIS — Z96652 Presence of left artificial knee joint: Secondary | ICD-10-CM | POA: Diagnosis not present

## 2014-07-31 DIAGNOSIS — M7062 Trochanteric bursitis, left hip: Secondary | ICD-10-CM | POA: Diagnosis not present

## 2014-07-31 DIAGNOSIS — Z471 Aftercare following joint replacement surgery: Secondary | ICD-10-CM | POA: Diagnosis not present

## 2014-08-27 ENCOUNTER — Ambulatory Visit: Payer: Medicare Other | Admitting: Dietician

## 2014-08-28 ENCOUNTER — Other Ambulatory Visit: Payer: Self-pay | Admitting: Family Medicine

## 2014-08-28 NOTE — Telephone Encounter (Signed)
Zolpidem  Last visit 07/15/14 Last refill 06/05/14 #90 0 refill

## 2014-08-28 NOTE — Telephone Encounter (Signed)
Refill request for Zolpidem tartrate and send to [facility].

## 2014-08-28 NOTE — Telephone Encounter (Signed)
Refill for one year 

## 2014-08-29 ENCOUNTER — Ambulatory Visit (INDEPENDENT_AMBULATORY_CARE_PROVIDER_SITE_OTHER): Payer: BLUE CROSS/BLUE SHIELD | Admitting: Family Medicine

## 2014-08-29 ENCOUNTER — Encounter: Payer: Self-pay | Admitting: Family Medicine

## 2014-08-29 VITALS — BP 120/70 | HR 70 | Temp 97.8°F | Wt 199.0 lb

## 2014-08-29 DIAGNOSIS — I10 Essential (primary) hypertension: Secondary | ICD-10-CM | POA: Diagnosis not present

## 2014-08-29 MED ORDER — ZOLPIDEM TARTRATE 10 MG PO TABS: 10.0000 mg | ORAL_TABLET | Freq: Every evening | ORAL | Status: DC | PRN

## 2014-08-29 NOTE — Telephone Encounter (Signed)
Faxed to mail order. 

## 2014-08-29 NOTE — Progress Notes (Signed)
Pre visit review using our clinic review tool, if applicable. No additional management support is needed unless otherwise documented below in the visit note. 

## 2014-08-29 NOTE — Patient Instructions (Signed)
Stop Lisinopril. Monitor blood pressure and start back if consistently > 140/90.

## 2014-08-29 NOTE — Progress Notes (Signed)
Subjective:    Patient ID: [name], female    DOB: 1955-09-10, 59 y.o.   MRN: [id number]  HPI Patient seen for medical follow-up She had gastric surgery back in the fall is done extremely well since that time. She has just didn't below 200 pounds her weight is to get down 160 pounds eventually. She feels well overall. We have thus far been L discontinue her proton pump inhibitor as well as cholesterol medication. She takes lisinopril 10 mg daily for hypertension and blood pressures been very well controlled. No dizziness. She is walking at least 10,000 steps per day and stamina improving  Past Medical History  Diagnosis Date  . ANGIOMA 10/03/2008    REMOVED FROM RIGHT LOWER LEG-BENIGN  . HYPERLIPIDEMIA 08/27/2008  . DEPRESSION 12/10/2009  . HYPERTENSION 08/27/2008  . GERD 08/27/2008  . SEBORRHEIC KERATOSIS 08/27/2008  . Arthritis     RA  . Fibromyalgia   . Complication of anesthesia   . PONV (postoperative nausea and vomiting)   . History of nonmelanoma skin cancer   . Cancer     BASAL CELL SKIN CANCER   Past Surgical History  Procedure Laterality Date  . Cesarean section  1980  . Cesarean section  1982  . Cholecystectomy  1997  . Abdominal hysterectomy  1995  . Foot surgery  2004, 2005, 2008  . Knee arthroscopy  2007, 2208, 2009  . Nasal sinus surgery  2013  . Breast ductal system excision  08/16/2011    Procedure: EXCISION DUCTAL SYSTEM BREAST;  Surgeon: Joyice Faster. Cornett, MD;  Location: [place];  Service: General;  Laterality: Left;  left breast duct excision  . Open surgical repair of gluteal tendon  01/17/2012    Procedure: OPEN SURGICAL REPAIR OF GLUTEAL TENDON;  Surgeon: Mauri Pole, MD;  Location: [facility];  Service: Orthopedics;  Laterality: Right;  . Joint replacement  10,12    lt total knee X2  . Laparoscopic gastric sleeve resection N/A 10/30/2013    Procedure: LAPAROSCOPIC GASTRIC SLEEVE RESECTION WITH HIATAL HERNIA REPAIR AND UPPER ENDOSCOPY;   Surgeon: Greer Pickerel, MD;  Location: [facility];  Service: General;  Laterality: N/A;    reports that she quit smoking about 34 years ago. Her smoking use included Cigarettes. She has a 2.5 pack-year smoking history. She has never used smokeless tobacco. She reports that she does not drink alcohol or use illicit drugs. family history includes Cancer in her father, mother, and other; Diabetes in her other; Heart disease in her other; Hyperlipidemia in her other; Hypertension in her other; Kidney disease in her other. Allergies  Allergen Reactions  . Codeine Sulfate Other (See Comments)    GI upset  . Penicillins Rash      Review of Systems  Constitutional: Negative for fever, chills, fatigue and unexpected weight change.  Eyes: Negative for visual disturbance.  Respiratory: Negative for cough, chest tightness, shortness of breath and wheezing.   Cardiovascular: Negative for chest pain, palpitations and leg swelling.  Endocrine: Negative for polydipsia and polyuria.  Neurological: Negative for dizziness, seizures, syncope, weakness, light-headedness and headaches.       Objective:   Physical Exam  Constitutional: She appears well-developed and well-nourished.  Neck: Neck supple. No JVD present. No thyromegaly present.  Cardiovascular: Normal rate and regular rhythm.   Pulmonary/Chest: Effort normal and breath sounds normal. No respiratory distress. She has no wheezes. She has no rales.  Musculoskeletal: She exhibits no edema.  Assessment & Plan:  Hypertension. Well controlled with recent weight loss. Discontinue lisinopril. Monitor blood pressure and be in touch if consistently greater than 140/90. Routine follow-up 6 months

## 2014-09-12 DIAGNOSIS — M7061 Trochanteric bursitis, right hip: Secondary | ICD-10-CM | POA: Diagnosis not present

## 2014-09-18 ENCOUNTER — Encounter: Payer: Self-pay | Admitting: Family Medicine

## 2014-09-19 ENCOUNTER — Other Ambulatory Visit: Payer: Self-pay

## 2014-09-19 MED ORDER — SCOPOLAMINE 1 MG/3DAYS TD PT72: 1.0000 | MEDICATED_PATCH | TRANSDERMAL | Status: DC

## 2014-09-19 MED ORDER — EPINEPHRINE 0.3 MG/0.3ML IJ SOAJ: 0.3000 mg | Freq: Once | INTRAMUSCULAR | Status: DC

## 2014-09-23 ENCOUNTER — Encounter: Payer: Self-pay | Admitting: Family Medicine

## 2014-09-23 ENCOUNTER — Ambulatory Visit (INDEPENDENT_AMBULATORY_CARE_PROVIDER_SITE_OTHER): Payer: BLUE CROSS/BLUE SHIELD | Admitting: Family Medicine

## 2014-09-23 VITALS — BP 120/80 | HR 70 | Temp 98.1°F | Wt 198.0 lb

## 2014-09-23 DIAGNOSIS — R1012 Left upper quadrant pain: Secondary | ICD-10-CM | POA: Diagnosis not present

## 2014-09-23 DIAGNOSIS — R109 Unspecified abdominal pain: Secondary | ICD-10-CM | POA: Diagnosis not present

## 2014-09-23 DIAGNOSIS — R312 Other microscopic hematuria: Secondary | ICD-10-CM | POA: Diagnosis not present

## 2014-09-23 DIAGNOSIS — R300 Dysuria: Secondary | ICD-10-CM

## 2014-09-23 DIAGNOSIS — R3129 Other microscopic hematuria: Secondary | ICD-10-CM

## 2014-09-23 LAB — COMPREHENSIVE METABOLIC PANEL
ALT: 13 U/L (ref 0–35)
AST: 17 U/L (ref 0–37)
Albumin: 3.8 g/dL (ref 3.5–5.2)
Alkaline Phosphatase: 67 U/L (ref 39–117)
BUN: 17 mg/dL (ref 6–23)
CO2: 29 mEq/L (ref 19–32)
Calcium: 9.5 mg/dL (ref 8.4–10.5)
Chloride: 105 mEq/L (ref 96–112)
Creatinine, Ser: 0.54 mg/dL (ref 0.40–1.20)
GFR: 122.98 mL/min (ref >60.00)
Glucose, Bld: 79 mg/dL (ref 70–99)
Potassium: 3.9 mEq/L (ref 3.5–5.1)
Sodium: 142 mEq/L (ref 135–145)
Total Bilirubin: 0.4 mg/dL (ref 0.2–1.2)
Total Protein: 6.4 g/dL (ref 6.0–8.3)

## 2014-09-23 LAB — POCT URINALYSIS DIPSTICK
Bilirubin, UA: NEGATIVE
Glucose, UA: NEGATIVE
Ketones, UA: NEGATIVE
Leukocytes, UA: NEGATIVE
Nitrite, UA: NEGATIVE
Protein, UA: NEGATIVE
Spec Grav, UA: 1.025
Urobilinogen, UA: 0.2
pH, UA: 5.5

## 2014-09-23 LAB — CBC WITH DIFFERENTIAL/PLATELET
Basophils Absolute: 0 10*3/uL (ref 0.0–0.1)
Basophils Relative: 0.6 % (ref 0.0–3.0)
Eosinophils Absolute: 0.1 10*3/uL (ref 0.0–0.7)
Eosinophils Relative: 2.3 % (ref 0.0–5.0)
HCT: 35.9 % — ABNORMAL LOW (ref 36.0–46.0)
Hemoglobin: 11.6 g/dL — ABNORMAL LOW (ref 12.0–15.0)
Lymphocytes Relative: 30.4 % (ref 12.0–46.0)
Lymphs Abs: 2 10*3/uL (ref 0.7–4.0)
MCHC: 32.3 g/dL (ref 30.0–36.0)
MCV: 91.8 fl (ref 78.0–100.0)
Monocytes Absolute: 0.8 10*3/uL (ref 0.1–1.0)
Monocytes Relative: 11.8 % (ref 3.0–12.0)
Neutro Abs: 3.6 10*3/uL (ref 1.4–7.7)
Neutrophils Relative %: 54.9 % (ref 43.0–77.0)
Platelets: 325 10*3/uL (ref 150.0–400.0)
RBC: 3.91 Mil/uL (ref 3.87–5.11)
RDW: 15.1 % (ref 11.5–15.5)
WBC: 6.5 10*3/uL (ref 4.0–10.5)

## 2014-09-23 NOTE — Progress Notes (Signed)
Subjective:    Patient ID: [name], female    DOB: [birth year], 59 y.o.   MRN: [id number]  HPI Patient seen with persistent left upper quadrant abdominal pain with some radiation to the left flank region over the past several weeks. This started back in May. She was seen at that time and had no gross hematuria but urine dipstick did reveal microscopic hematuria. She was referred to urology but apparently was not given appointment until September. She has had some weight loss that she attributes to her efforts with recent gastric bypass surgery. She's had occasional urine frequency but no burning with urination. One-day history of some nausea without vomiting.  Pain is constant and dull with no exacerbating or alleviating factors. No change with position change. No burning with urination. No stool changes. No melena.  She had plain film of abdomen back in May which showed no abnormal calcifications. No history of any known kidney stones.  Past Medical History  Diagnosis Date  . ANGIOMA 10/03/2008    REMOVED FROM RIGHT LOWER LEG-BENIGN  . HYPERLIPIDEMIA 08/27/2008  . DEPRESSION 12/10/2009  . HYPERTENSION 08/27/2008  . GERD 08/27/2008  . SEBORRHEIC KERATOSIS 08/27/2008  . Arthritis     RA  . Fibromyalgia   . Complication of anesthesia   . PONV (postoperative nausea and vomiting)   . History of nonmelanoma skin cancer   . Cancer     BASAL CELL SKIN CANCER   Past Surgical History  Procedure Laterality Date  . Cesarean section  1980  . Cesarean section  1982  . Cholecystectomy  1997  . Abdominal hysterectomy  1995  . Foot surgery  2004, 2005, 2008  . Knee arthroscopy  2007, 2208, 2009  . Nasal sinus surgery  2013  . Breast ductal system excision  08/16/2011    Procedure: EXCISION DUCTAL SYSTEM BREAST;  Surgeon: Joyice Faster. Cornett, MD;  Location: [facility];  Service: General;  Laterality: Left;  left breast duct excision  . Open surgical repair of gluteal tendon   01/17/2012    Procedure: OPEN SURGICAL REPAIR OF GLUTEAL TENDON;  Surgeon: Mauri Pole, MD;  Location: [facility];  Service: Orthopedics;  Laterality: Right;  . Joint replacement  10,12    lt total knee X2  . Laparoscopic gastric sleeve resection N/A 10/30/2013    Procedure: LAPAROSCOPIC GASTRIC SLEEVE RESECTION WITH HIATAL HERNIA REPAIR AND UPPER ENDOSCOPY;  Surgeon: Greer Pickerel, MD;  Location: [facility];  Service: General;  Laterality: N/A;    reports that she quit smoking about 34 years ago. Her smoking use included Cigarettes. She has a 2.5 pack-year smoking history. She has never used smokeless tobacco. She reports that she does not drink alcohol or use illicit drugs. family history includes Cancer in her father, mother, and other; Diabetes in her other; Heart disease in her other; Hyperlipidemia in her other; Hypertension in her other; Kidney disease in her other. Allergies  Allergen Reactions  . Codeine Sulfate Other (See Comments)    GI upset  . Penicillins Rash      Review of Systems  Constitutional: Negative for fever, chills, appetite change and unexpected weight change.  Respiratory: Negative for shortness of breath.   Cardiovascular: Negative for chest pain.  Gastrointestinal: Positive for nausea and abdominal pain. Negative for vomiting, diarrhea, constipation, blood in stool and abdominal distention.  Endocrine: Negative for polydipsia and polyuria.  Genitourinary: Positive for frequency. Negative for dysuria and hematuria.  Musculoskeletal: Positive for back  pain.  Skin: Negative for rash.  Neurological: Negative for dizziness.       Objective:   Physical Exam  Constitutional: She appears well-developed and well-nourished. No distress.  Neck: Neck supple. No thyromegaly present.  Cardiovascular: Normal rate and regular rhythm.   Pulmonary/Chest: Effort normal and breath sounds normal. No respiratory distress. She has no wheezes. She has no rales.  Abdominal: Soft. Bowel  sounds are normal. She exhibits no distension and no mass. There is tenderness. There is no rebound and no guarding.  She has some mild tenderness to palpation left upper quadrant. No masses palpated. No guarding or rebound  Skin: No rash noted.          Assessment & Plan:  Persistent left upper quadrant abdominal pain with radiation to left flank and microscopic hematuria. Pending referral to urology. Check CBC and comprehensive metabolic panel. CT abdomen and pelvis with contrast given duration of symptoms.

## 2014-09-23 NOTE — Progress Notes (Signed)
Pre visit review using our clinic review tool, if applicable. No additional management support is needed unless otherwise documented below in the visit note. 

## 2014-09-27 ENCOUNTER — Encounter: Payer: Self-pay | Admitting: Family Medicine

## 2014-09-27 ENCOUNTER — Telehealth: Payer: Self-pay

## 2014-09-27 NOTE — Telephone Encounter (Signed)
Pt wants to know her results to about CT Scan

## 2014-09-27 NOTE — Telephone Encounter (Signed)
i do not see that result has been released yet.  Find out when this was done

## 2014-09-27 NOTE — Telephone Encounter (Signed)
Pt is being scheduled for appt.

## 2014-09-30 ENCOUNTER — Inpatient Hospital Stay: Admission: RE | Admit: 2014-09-30 | Payer: Medicare Other | Source: Ambulatory Visit

## 2014-10-04 ENCOUNTER — Ambulatory Visit (INDEPENDENT_AMBULATORY_CARE_PROVIDER_SITE_OTHER)
Admission: RE | Admit: 2014-10-04 | Discharge: 2014-10-04 | Disposition: A | Discharge status: Home / Self Care | Payer: BLUE CROSS/BLUE SHIELD | Source: Ambulatory Visit | Attending: Family Medicine | Admitting: Family Medicine

## 2014-10-04 DIAGNOSIS — M25551 Pain in right hip: Secondary | ICD-10-CM | POA: Diagnosis not present

## 2014-10-04 DIAGNOSIS — R1012 Left upper quadrant pain: Secondary | ICD-10-CM

## 2014-10-04 DIAGNOSIS — R312 Other microscopic hematuria: Secondary | ICD-10-CM | POA: Diagnosis not present

## 2014-10-04 MED ORDER — IOHEXOL 300 MG/ML  SOLN
100.0000 mL | Freq: Once | INTRAMUSCULAR | Status: AC | PRN
  Administered 2014-10-04: 100 mL via INTRAVENOUS

## 2014-10-07 ENCOUNTER — Other Ambulatory Visit: Payer: Self-pay | Admitting: Family Medicine

## 2014-10-07 DIAGNOSIS — R319 Hematuria, unspecified: Secondary | ICD-10-CM

## 2014-10-09 ENCOUNTER — Encounter: Payer: Self-pay | Admitting: Family Medicine

## 2014-10-10 ENCOUNTER — Encounter: Payer: Self-pay | Admitting: Family Medicine

## 2014-10-10 ENCOUNTER — Ambulatory Visit (INDEPENDENT_AMBULATORY_CARE_PROVIDER_SITE_OTHER): Payer: BLUE CROSS/BLUE SHIELD | Admitting: Family Medicine

## 2014-10-10 VITALS — BP 120/70 | HR 64 | Temp 97.6°F | Wt 197.0 lb

## 2014-10-10 DIAGNOSIS — R21 Rash and other nonspecific skin eruption: Secondary | ICD-10-CM | POA: Diagnosis not present

## 2014-10-10 MED ORDER — METHYLPREDNISOLONE ACETATE 80 MG/ML IJ SUSP
80.0000 mg | Freq: Once | INTRAMUSCULAR | Status: AC
  Administered 2014-10-10: 80 mg via INTRAMUSCULAR

## 2014-10-10 NOTE — Patient Instructions (Signed)
Consider supplementing anti-histamine with Pepcid or Zantac.   Let me know if rash not clearing by early next week.

## 2014-10-10 NOTE — Progress Notes (Signed)
Subjective:    Patient ID: [name], female    DOB: [birth year], 59 y.o.   MRN: [id number]  HPI Patient seen with persistent rash involving her legs and trunk area and buttocks. Significant pruritus. She has slightly raised erythematous rash. No vesicles. No pustules. Nonscaly. No clear triggers. She's tried topical prescription steroid cream and Benadryl at night without relief. She tried bumping her oral prednisone up to 20 mg daily which has not helped. No history of similar rash. No one else at home with similar rash.  Past Medical History  Diagnosis Date  . ANGIOMA 10/03/2008    REMOVED FROM RIGHT LOWER LEG-BENIGN  . HYPERLIPIDEMIA 08/27/2008  . DEPRESSION 12/10/2009  . HYPERTENSION 08/27/2008  . GERD 08/27/2008  . SEBORRHEIC KERATOSIS 08/27/2008  . Arthritis     RA  . Fibromyalgia   . Complication of anesthesia   . PONV (postoperative nausea and vomiting)   . History of nonmelanoma skin cancer   . Cancer     BASAL CELL SKIN CANCER   Past Surgical History  Procedure Laterality Date  . Cesarean section  1980  . Cesarean section  1982  . Cholecystectomy  1997  . Abdominal hysterectomy  1995  . Foot surgery  2004, 2005, 2008  . Knee arthroscopy  2007, 2208, 2009  . Nasal sinus surgery  2013  . Breast ductal system excision  08/16/2011    Procedure: EXCISION DUCTAL SYSTEM BREAST;  Surgeon: Joyice Faster. Cornett, MD;  Location: [facility];  Service: General;  Laterality: Left;  left breast duct excision  . Open surgical repair of gluteal tendon  01/17/2012    Procedure: OPEN SURGICAL REPAIR OF GLUTEAL TENDON;  Surgeon: Mauri Pole, MD;  Location: [facility];  Service: Orthopedics;  Laterality: Right;  . Joint replacement  10,12    lt total knee X2  . Laparoscopic gastric sleeve resection N/A 10/30/2013    Procedure: LAPAROSCOPIC GASTRIC SLEEVE RESECTION WITH HIATAL HERNIA REPAIR AND UPPER ENDOSCOPY;  Surgeon: Greer Pickerel, MD;  Location: [facility];  Service: General;   Laterality: N/A;    reports that she quit smoking about 34 years ago. Her smoking use included Cigarettes. She has a 2.5 pack-year smoking history. She has never used smokeless tobacco. She reports that she does not drink alcohol or use illicit drugs. family history includes Cancer in her father, mother, and other; Diabetes in her other; Heart disease in her other; Hyperlipidemia in her other; Hypertension in her other; Kidney disease in her other. Allergies  Allergen Reactions  . Bee Venom   . Codeine Sulfate Other (See Comments)    GI upset  . Penicillins Rash      Review of Systems  Constitutional: Negative for fever and chills.  Skin: Positive for rash.       Objective:   Physical Exam  Constitutional: She appears well-developed and well-nourished. No distress.  Cardiovascular: Normal rate and regular rhythm.   Pulmonary/Chest: Effort normal and breath sounds normal. No respiratory distress. She has no wheezes. She has no rales.  Skin: Rash noted.  She has nonspecific erythematous slightly raised rash scattered irregular distribution fairly symmetrically involving her legs thighs back and buttock region. No pustules. No vesicles. Nonscaly          Assessment & Plan:  Nonspecific skin rash. This appears more allergic. Consider addition of Zantac or Pepcid to her Benadryl. She'll also consider daytime use of Zyrtec. Depo-Medrol 80 mg IM. Touch base early next  week if rash not resolving

## 2014-10-10 NOTE — Progress Notes (Signed)
Pre visit review using our clinic review tool, if applicable. No additional management support is needed unless otherwise documented below in the visit note. 

## 2014-10-15 ENCOUNTER — Encounter (HOSPITAL_COMMUNITY): Payer: Self-pay

## 2014-10-15 ENCOUNTER — Encounter (HOSPITAL_COMMUNITY)
Admission: RE | Admit: 2014-10-15 | Discharge: 2014-10-15 | Disposition: A | Discharge status: Home / Self Care | Payer: BLUE CROSS/BLUE SHIELD | Source: Ambulatory Visit | Attending: Orthopedic Surgery | Admitting: Orthopedic Surgery

## 2014-10-15 DIAGNOSIS — I10 Essential (primary) hypertension: Secondary | ICD-10-CM | POA: Diagnosis not present

## 2014-10-15 DIAGNOSIS — F329 Major depressive disorder, single episode, unspecified: Secondary | ICD-10-CM | POA: Diagnosis not present

## 2014-10-15 DIAGNOSIS — R1013 Epigastric pain: Secondary | ICD-10-CM | POA: Insufficient documentation

## 2014-10-15 DIAGNOSIS — K219 Gastro-esophageal reflux disease without esophagitis: Secondary | ICD-10-CM | POA: Insufficient documentation

## 2014-10-15 DIAGNOSIS — Z01810 Encounter for preprocedural cardiovascular examination: Secondary | ICD-10-CM | POA: Insufficient documentation

## 2014-10-15 DIAGNOSIS — M25759 Osteophyte, unspecified hip: Secondary | ICD-10-CM | POA: Insufficient documentation

## 2014-10-15 DIAGNOSIS — E785 Hyperlipidemia, unspecified: Secondary | ICD-10-CM | POA: Diagnosis not present

## 2014-10-15 DIAGNOSIS — Z01812 Encounter for preprocedural laboratory examination: Secondary | ICD-10-CM | POA: Insufficient documentation

## 2014-10-15 DIAGNOSIS — J019 Acute sinusitis, unspecified: Secondary | ICD-10-CM | POA: Diagnosis not present

## 2014-10-15 DIAGNOSIS — R609 Edema, unspecified: Secondary | ICD-10-CM | POA: Diagnosis not present

## 2014-10-15 DIAGNOSIS — M2662 Arthralgia of temporomandibular joint: Secondary | ICD-10-CM | POA: Insufficient documentation

## 2014-10-15 HISTORY — DX: Pneumonia, unspecified organism: J18.9

## 2014-10-15 HISTORY — DX: Personal history of other diseases of the digestive system: Z87.19

## 2014-10-15 HISTORY — DX: Personal history of other infectious and parasitic diseases: Z86.19

## 2014-10-15 HISTORY — DX: Anxiety disorder, unspecified: F41.9

## 2014-10-15 HISTORY — DX: Personal history of other medical treatment: Z92.89

## 2014-10-15 LAB — URINALYSIS, ROUTINE W REFLEX MICROSCOPIC
Bilirubin Urine: NEGATIVE
Glucose, UA: NEGATIVE mg/dL
Ketones, ur: NEGATIVE mg/dL
Leukocytes, UA: NEGATIVE
Nitrite: NEGATIVE
Protein, ur: NEGATIVE mg/dL
Specific Gravity, Urine: 1.015 (ref 1.005–1.030)
Urobilinogen, UA: 0.2 mg/dL (ref 0.0–1.0)
pH: 6 (ref 5.0–8.0)

## 2014-10-15 LAB — SURGICAL PCR SCREEN
MRSA, PCR: NEGATIVE
Staphylococcus aureus: NEGATIVE

## 2014-10-15 LAB — CBC
HCT: 38.6 % (ref 36.0–46.0)
Hemoglobin: 12.1 g/dL (ref 12.0–15.0)
MCH: 28.6 pg (ref 26.0–34.0)
MCHC: 31.3 g/dL (ref 30.0–36.0)
MCV: 91.3 fL (ref 78.0–100.0)
Platelets: 375 10*3/uL (ref 150–400)
RBC: 4.23 MIL/uL (ref 3.87–5.11)
RDW: 13.9 % (ref 11.5–15.5)
WBC: 6.5 10*3/uL (ref 4.0–10.5)

## 2014-10-15 LAB — URINE MICROSCOPIC-ADD ON

## 2014-10-15 LAB — PROTIME-INR
INR: 0.97 (ref 0.00–1.49)
Prothrombin Time: 13.1 seconds (ref 11.6–15.2)

## 2014-10-15 LAB — APTT: aPTT: 28 seconds (ref 24–37)

## 2014-10-15 NOTE — Progress Notes (Signed)
CMP results/epic 09/23/2014

## 2014-10-15 NOTE — Patient Instructions (Signed)
[name]  10/15/2014   Your procedure is scheduled on: Monday October 21, 2014   Report to [facility]  Entrance take [place]  elevators to 3rd floor to  [place] at 12:00 PM.  Call this number if you have problems the morning of surgery [phone]   Remember: ONLY 1 PERSON MAY GO WITH YOU TO SHORT STAY TO GET  READY MORNING OF [employer].  Do not eat food After Midnight but may take clearl liquids till 8:00 am day of surgery then nothing by mouth.      Take these medicines the morning of surgery with A SIP OF WATER: Oxycodone-Acetaminophen if needed; Sertraline (Zoloft); Prednisone                               You may not have any metal on your body including hair pins and              piercings  Do not wear jewelry, make-up, lotions, powders or perfumes, deodorant             Do not wear nail polish.  Do not shave  48 hours prior to surgery.                 Do not bring valuables to the hospital. [place].  Contacts, dentures or bridgework may not be worn into surgery.  Leave suitcase in the car. After surgery it may be brought to your room.                  Please read over the following fact sheets you were given:MRSA INFORMATION SHEET; INCENTIVE SPIROMETER; BLOOD TRANSFUSION INFORMATION SHEET _____________________________________________________________________             [facility] - Preparing for Surgery Before surgery, you can play an important role.  Because skin is not sterile, your skin needs to be as free of germs as possible.  You can reduce the number of germs on your skin by washing with CHG (chlorahexidine gluconate) soap before surgery.  CHG is an antiseptic cleaner which kills germs and bonds with the skin to continue killing germs even after washing. Please DO NOT use if you have an allergy to CHG or antibacterial soaps.  If your skin becomes reddened/irritated stop  using the CHG and inform your nurse when you arrive at Short Stay. Do not shave (including legs and underarms) for at least 48 hours prior to the first CHG shower.  You may shave your face/neck. Please follow these instructions carefully:  1.  Shower with CHG Soap the night before surgery and the  morning of Surgery.  2.  If you choose to wash your hair, wash your hair first as usual with your  normal  shampoo.  3.  After you shampoo, rinse your hair and body thoroughly to remove the  shampoo.                           4.  Use CHG as you would any other liquid soap.  You can apply chg directly  to the skin and wash  Gently with a scrungie or clean washcloth.  5.  Apply the CHG Soap to your body ONLY FROM THE NECK DOWN.   Do not use on face/ open                           Wound or open sores. Avoid contact with eyes, ears mouth and genitals (private parts).                       Wash face,  Genitals (private parts) with your normal soap.             6.  Wash thoroughly, paying special attention to the area where your surgery  will be performed.  7.  Thoroughly rinse your body with warm water from the neck down.  8.  DO NOT shower/wash with your normal soap after using and rinsing off  the CHG Soap.                9.  Pat yourself dry with a clean towel.            10.  Wear clean pajamas.            11.  Place clean sheets on your bed the night of your first shower and do not  sleep with pets. Day of Surgery : Do not apply any lotions/deodorants the morning of surgery.  Please wear clean clothes to the hospital/surgery center.  FAILURE TO FOLLOW THESE INSTRUCTIONS MAY RESULT IN THE CANCELLATION OF YOUR SURGERY PATIENT SIGNATURE_________________________________  NURSE SIGNATURE__________________________________  ________________________________________________________________________    CLEAR LIQUID DIET   Foods Allowed                                                                      Foods Excluded  Coffee and tea, regular and decaf                             liquids that you cannot  Plain Jell-O in any flavor                                             see through such as: Fruit ices (not with fruit pulp)                                     milk, soups, orange juice  Iced Popsicles                                    All solid food Carbonated beverages, regular and diet                                    Cranberry, grape and apple juices Sports drinks like Gatorade Lightly seasoned clear broth or consume(fat free) Sugar, honey syrup  Sample Menu Breakfast                                Lunch                                     Supper Cranberry juice                    Beef broth                            Chicken broth Jell-O                                     Grape juice                           Apple juice Coffee or tea                        Jell-O                                      Popsicle                                                Coffee or tea                        Coffee or tea  _____________________________________________________________________    Incentive Spirometer  An incentive spirometer is a tool that can help keep your lungs clear and active. This tool measures how well you are filling your lungs with each breath. Taking long deep breaths may help reverse or decrease the chance of developing breathing (pulmonary) problems (especially infection) following:  A long period of time when you are unable to move or be active. BEFORE THE PROCEDURE   If the spirometer includes an indicator to show your best effort, your nurse or respiratory therapist will set it to a desired goal.  If possible, sit up straight or lean slightly forward. Try not to slouch.  Hold the incentive spirometer in an upright position. INSTRUCTIONS FOR USE   Sit on the edge of your bed if possible, or sit up as far as you can in bed or on a  chair.  Hold the incentive spirometer in an upright position.  Breathe out normally.  Place the mouthpiece in your mouth and seal your lips tightly around it.  Breathe in slowly and as deeply as possible, raising the piston or the ball toward the top of the column.  Hold your breath for 3-5 seconds or for as long as possible. Allow the piston or ball to fall to the bottom of the column.  Remove the mouthpiece from your mouth and breathe out normally.  Rest for a few seconds and repeat Steps 1 through 7 at least 10 times every 1-2 hours when you are awake. Take your time and take a few normal breaths between  deep breaths.  The spirometer may include an indicator to show your best effort. Use the indicator as a goal to work toward during each repetition.  After each set of 10 deep breaths, practice coughing to be sure your lungs are clear. If you have an incision (the cut made at the time of surgery), support your incision when coughing by placing a pillow or rolled up towels firmly against it. Once you are able to get out of bed, walk around indoors and cough well. You may stop using the incentive spirometer when instructed by your caregiver.  RISKS AND COMPLICATIONS  Take your time so you do not get dizzy or light-headed.  If you are in pain, you may need to take or ask for pain medication before doing incentive spirometry. It is harder to take a deep breath if you are having pain. AFTER USE  Rest and breathe slowly and easily.  It can be helpful to keep track of a log of your progress. Your caregiver can provide you with a simple table to help with this. If you are using the spirometer at home, follow these instructions: [place] IF:   You are having difficultly using the spirometer.  You have trouble using the spirometer as often as instructed.  Your pain medication is not giving enough relief while using the spirometer.  You develop fever of 100.5 F (38.1 C) or  higher. SEEK IMMEDIATE MEDICAL CARE IF:   You cough up bloody sputum that had not been present before.  You develop fever of 102 F (38.9 C) or greater.  You develop worsening pain at or near the incision site. MAKE SURE YOU:   Understand these instructions.  Will watch your condition.  Will get help right away if you are not doing well or get worse. Document Released: 06/21/2006 Document Revised: 05/03/2011 Document Reviewed: 08/22/2006 Exit[employer] Patient Information 2014 [employer], [place].   ________________________________________________________________________  WHAT IS A BLOOD TRANSFUSION? Blood Transfusion Information  A transfusion is the replacement of blood or some of its parts. Blood is made up of multiple cells which provide different functions.  Red blood cells carry oxygen and are used for blood loss replacement.  White blood cells fight against infection.  Platelets control bleeding.  Plasma helps clot blood.  Other blood products are available for specialized needs, such as hemophilia or other clotting disorders. BEFORE THE TRANSFUSION  Who gives blood for transfusions?   Healthy volunteers who are fully evaluated to make sure their blood is safe. This is blood bank blood. Transfusion therapy is the safest it has ever been in the practice of medicine. Before blood is taken from a donor, a complete history is taken to make sure that person has no history of diseases nor engages in risky social behavior (examples are intravenous drug use or sexual activity with multiple partners). The donor's travel history is screened to minimize risk of transmitting infections, such as malaria. The donated blood is tested for signs of infectious diseases, such as HIV and hepatitis. The blood is then tested to be sure it is compatible with you in order to minimize the chance of a transfusion reaction. If you or a relative donates blood, this is often done in anticipation of surgery  and is not appropriate for emergency situations. It takes many days to process the donated blood. RISKS AND COMPLICATIONS Although transfusion therapy is very safe and saves many lives, the main dangers of transfusion include:   Getting an infectious disease.  Developing a transfusion reaction. This is an allergic reaction to something in the blood you were given. Every precaution is taken to prevent this. The decision to have a blood transfusion has been considered carefully by your caregiver before blood is given. Blood is not given unless the benefits outweigh the risks. AFTER THE TRANSFUSION  Right after receiving a blood transfusion, you will usually feel much better and more energetic. This is especially true if your red blood cells have gotten low (anemic). The transfusion raises the level of the red blood cells which carry oxygen, and this usually causes an energy increase.  The nurse administering the transfusion will monitor you carefully for complications. HOME CARE INSTRUCTIONS  No special instructions are needed after a transfusion. You may find your energy is better. Speak with your caregiver about any limitations on activity for underlying diseases you may have. SEEK MEDICAL CARE IF:   Your condition is not improving after your transfusion.  You develop redness or irritation at the intravenous (IV) site. SEEK IMMEDIATE MEDICAL CARE IF:  Any of the following symptoms occur over the next 12 hours:  Shaking chills.  You have a temperature by mouth above 102 F (38.9 C), not controlled by medicine.  Chest, back, or muscle pain.  People around you feel you are not acting correctly or are confused.  Shortness of breath or difficulty breathing.  Dizziness and fainting.  You get a rash or develop hives.  You have a decrease in urine output.  Your urine turns a dark color or changes to pink, red, or brown. Any of the following symptoms occur over the next 10  days:  You have a temperature by mouth above 102 F (38.9 C), not controlled by medicine.  Shortness of breath.  Weakness after normal activity.  The white part of the eye turns yellow (jaundice).  You have a decrease in the amount of urine or are urinating less often.  Your urine turns a dark color or changes to pink, red, or brown. Document Released: 02/06/2000 Document Revised: 05/03/2011 Document Reviewed: 09/25/2007 [facility] Patient Information 2014 [place], [place].  _______________________________________________________________________

## 2014-10-15 NOTE — Progress Notes (Signed)
Urinalysis and micro results in epic per PAT visit 10/15/2014 sent to Dr Alvan Dame

## 2014-10-15 NOTE — Progress Notes (Signed)
CT abd/pelvis w/contrast lungs discussed (epic) 10/04/2014 ECHO epic 07/04/2006

## 2014-10-16 DIAGNOSIS — M79643 Pain in unspecified hand: Secondary | ICD-10-CM | POA: Diagnosis not present

## 2014-10-16 DIAGNOSIS — M797 Fibromyalgia: Secondary | ICD-10-CM | POA: Diagnosis not present

## 2014-10-16 DIAGNOSIS — M0589 Other rheumatoid arthritis with rheumatoid factor of multiple sites: Secondary | ICD-10-CM | POA: Diagnosis not present

## 2014-10-16 DIAGNOSIS — M150 Primary generalized (osteo)arthritis: Secondary | ICD-10-CM | POA: Diagnosis not present

## 2014-10-17 NOTE — H&P (Signed)
[name] is an 59 y.o. female.    Chief Complaint: Right lateral hip pain, gluteus medius/minimus tear and bursitis   Procedure:  Open right hip bursectomy and open gluteus medius/minimus repair  HPI: Pt is a 59 y.o. female complaining of right hip pain for 6+ months. Pain had continually increased since the beginning. MRI of the right hip reveal possible gluteal tear.  She has previously had a bursectomy of the right hip and repair of the gluteal musculature with good success.  She feels that this is similar to the previous symptoms and wishes to try the procedure again.  Dr. Alvan Dame has had a long discussion with the patient stating that he is unsure of the success being that it will be the second time doing the surgery and gives her 50% chance of the surgery relieving her symptoms.  She understands the chance of success and still wishes to proceed, especially with the success of the first surgery to this point.   Pt has tried various conservative treatments which have failed to alleviate their symptoms, including NSAIDs, cortisone injections and activity modifications. Various options are discussed with the patient. Risks, benefits and expectations were discussed with the patient. Patient understand the risks, benefits and expectations and wishes to proceed with surgery.   PCP: Eulas Post, MD  D/C Plans:      Home with HHPT  Post-op Meds:       No Rx given  Tranexamic Acid:      To be given - IV  Decadron:      Is to be given  FYI:     ASA post-op  Oxycodone post-op    PMH: Past Medical History  Diagnosis Date  . ANGIOMA 10/03/2008    REMOVED FROM RIGHT LOWER LEG-BENIGN  . HYPERLIPIDEMIA 08/27/2008  . GERD 08/27/2008  . SEBORRHEIC KERATOSIS 08/27/2008  . Arthritis     RA  . Fibromyalgia   . Complication of anesthesia   . PONV (postoperative nausea and vomiting)   . History of nonmelanoma skin cancer   . Cancer     BASAL CELL SKIN CANCER  . HYPERTENSION 08/27/2008   pt is currently on no meds   . Pneumonia     hx of   . Bronchitis     hx of   . Anxiety   . History of hiatal hernia     hx of has had surgically repaired  . History of blood transfusion     1995  . History of measles   . History of mumps     PSH: Past Surgical History  Procedure Laterality Date  . Cesarean section  1980    times 2; 1980 and 1982  . Cesarean section  1982  . Cholecystectomy  1997  . Abdominal hysterectomy  1995  . Foot surgery  2004, 2005, 2008    bilat secondary to neuropathy   . Knee arthroscopy  2007, 2008, 2009  . Nasal sinus surgery  2013  . Breast ductal system excision  08/16/2011    Procedure: EXCISION DUCTAL SYSTEM BREAST;  Surgeon: Joyice Faster. Cornett, MD;  Location: [facility];  Service: General;  Laterality: Left;  left breast duct excision  . Open surgical repair of gluteal tendon  01/17/2012    Procedure: OPEN SURGICAL REPAIR OF GLUTEAL TENDON;  Surgeon: Mauri Pole, MD;  Location: [facility];  Service: Orthopedics;  Laterality: Right;  . Joint replacement  10,12    lt total  knee X2  . Laparoscopic gastric sleeve resection N/A 10/30/2013    Procedure: LAPAROSCOPIC GASTRIC SLEEVE RESECTION WITH HIATAL HERNIA REPAIR AND UPPER ENDOSCOPY;  Surgeon: Greer Pickerel, MD;  Location: [facility];  Service: General;  Laterality: N/A;  . Appendectomy      Social History:  reports that she quit smoking about 30 years ago. Her smoking use included Cigarettes. She has a 1 pack-year smoking history. She has never used smokeless tobacco. She reports that she does not drink alcohol or use illicit drugs.  Allergies:  Allergies  Allergen Reactions  . Bee Venom   . Codeine Sulfate Other (See Comments)    GI upset  . Penicillins Rash    Medications: No current facility-administered medications for this encounter.   Current Outpatient Prescriptions  Medication Sig Dispense Refill  . acetaminophen (TYLENOL) 500 MG tablet Take 1,000 mg by mouth every 6  (six) hours as needed for pain.     . B Complex-C (B-COMPLEX WITH VITAMIN C) tablet Take 1 tablet by mouth daily.    . Biotin 1000 MCG tablet Take 3,000 mcg by mouth daily.    . calcium-vitamin D (OSCAL WITH D) 500-200 MG-UNIT per tablet Take 2 tablets by mouth 2 (two) times daily.    Marland Kitchen EPINEPHrine (EPIPEN 2-PAK) 0.3 mg/0.3 mL IJ SOAJ injection Inject 0.3 mLs (0.3 mg total) into the muscle once. 1 Device 0  . estradiol (ESTRACE) 1 MG tablet Take 1 tablet (1 mg total) by mouth daily. 30 tablet 5  . etanercept (ENBREL) 50 MG/ML injection Inject 0.98 mLs (50 mg total) into the skin once a week. Resume end of next week (Patient taking differently: Inject 50 mg into the skin once a week. Friday) 3.81 mL 0  . folic acid (FOLVITE) 1 MG tablet Take 2 mg by mouth daily.     . hydroxychloroquine (PLAQUENIL) 200 MG tablet Take 200 mg by mouth 2 (two) times daily.    . methotrexate (50 MG/ML) 1 gm SOLR May resume previous dose at  end of next week (Patient taking differently: Inject 50 mg into the skin once a week. Friday)    . ondansetron (ZOFRAN ODT) 8 MG disintegrating tablet Take 1 tablet (8 mg total) by mouth every 8 (eight) hours as needed for nausea or vomiting. 15 tablet 0  . oxyCODONE-acetaminophen (PERCOCET/ROXICET) 5-325 MG per tablet Take 1 tablet by mouth every 8 (eight) hours as needed for moderate pain.     . Pediatric Multiple Vit-C-FA (FLINSTONES GUMMIES OMEGA-3 DHA PO) Take 4 tablets by mouth daily.    . pramipexole (MIRAPEX) 0.125 MG tablet TAKE 3 TABLETS AT BEDTIME 360 tablet 2  . predniSONE (DELTASONE) 5 MG tablet Take 5 mg by mouth daily with breakfast.    . scopolamine (TRANSDERM-SCOP) 1 MG/3DAYS Place 1 patch (1.5 mg total) onto the skin every 3 (three) days. Behind ear 10 patch 0  . sertraline (ZOLOFT) 50 MG tablet TAKE 1 TABLET DAILY 90 tablet 2  . zolpidem (AMBIEN) 10 MG tablet Take 1 tablet (10 mg total) by mouth at bedtime as needed for sleep. 90 tablet 3     Review of Systems    Constitutional: Negative.   HENT: Negative.   Eyes: Negative.   Respiratory: Negative.   Cardiovascular: Negative.   Gastrointestinal: Positive for heartburn.  Genitourinary: Negative.   Musculoskeletal: Positive for joint pain.  Skin: Negative.   Neurological: Negative.   Endo/Heme/Allergies: Negative.   Psychiatric/Behavioral: The patient is nervous/anxious.  Physical Exam  Constitutional: She is oriented to person, place, and time. She appears well-developed and well-nourished.  HENT:  Head: Normocephalic and atraumatic.  Eyes: Pupils are equal, round, and reactive to light.  Neck: Neck supple. No JVD present. No tracheal deviation present. No thyromegaly present.  Cardiovascular: Normal rate, regular rhythm, normal heart sounds and intact distal pulses.   Respiratory: Effort normal and breath sounds normal. No stridor. No respiratory distress. She has no wheezes.  GI: Soft. There is no tenderness. There is no guarding.  Musculoskeletal:       Right hip: She exhibits decreased strength, tenderness, swelling and laceration (healed previous incision). She exhibits normal range of motion and no deformity.  Lymphadenopathy:    She has no cervical adenopathy.  Neurological: She is alert and oriented to person, place, and time.  Skin: Skin is warm and dry.  Psychiatric: She has a normal mood and affect.       Assessment/Plan Assessment:    Right lateral hip pain, gluteus medius/minimus tear  and bursitis  Plan: Patient will undergo a open right hip bursectomy and open gluteus medius/minimus repair on 10/21/2014 per Dr. Alvan Dame at [facility]. Risks benefits and expectations were discussed with the patient. Patient understand risks, benefits and expectations and wishes to proceed.     West Pugh Jahmarion Popoff   PA-C  10/17/2014, 3:48 PM

## 2014-10-21 ENCOUNTER — Encounter (HOSPITAL_COMMUNITY)
Admission: RE | Disposition: A | Discharge status: Home / Self Care | Payer: Self-pay | Source: Ambulatory Visit | Attending: Orthopedic Surgery

## 2014-10-21 ENCOUNTER — Ambulatory Visit (HOSPITAL_COMMUNITY): Payer: BLUE CROSS/BLUE SHIELD | Admitting: Anesthesiology

## 2014-10-21 ENCOUNTER — Encounter (HOSPITAL_COMMUNITY): Payer: Self-pay | Admitting: *Deleted

## 2014-10-21 ENCOUNTER — Observation Stay (HOSPITAL_COMMUNITY)
Admission: RE | Admit: 2014-10-21 | Discharge: 2014-10-22 | Disposition: A | Discharge status: Home / Self Care | Payer: BLUE CROSS/BLUE SHIELD | Source: Ambulatory Visit | Attending: Orthopedic Surgery | Admitting: Orthopedic Surgery

## 2014-10-21 DIAGNOSIS — M25551 Pain in right hip: Secondary | ICD-10-CM | POA: Diagnosis not present

## 2014-10-21 DIAGNOSIS — M25759 Osteophyte, unspecified hip: Secondary | ICD-10-CM | POA: Diagnosis present

## 2014-10-21 DIAGNOSIS — Z7982 Long term (current) use of aspirin: Secondary | ICD-10-CM | POA: Insufficient documentation

## 2014-10-21 DIAGNOSIS — Z96652 Presence of left artificial knee joint: Secondary | ICD-10-CM | POA: Diagnosis not present

## 2014-10-21 DIAGNOSIS — Z885 Allergy status to narcotic agent status: Secondary | ICD-10-CM | POA: Diagnosis not present

## 2014-10-21 DIAGNOSIS — Z91030 Bee allergy status: Secondary | ICD-10-CM | POA: Insufficient documentation

## 2014-10-21 DIAGNOSIS — M7071 Other bursitis of hip, right hip: Secondary | ICD-10-CM | POA: Diagnosis not present

## 2014-10-21 DIAGNOSIS — Z79899 Other long term (current) drug therapy: Secondary | ICD-10-CM | POA: Insufficient documentation

## 2014-10-21 DIAGNOSIS — M899 Disorder of bone, unspecified: Secondary | ICD-10-CM | POA: Diagnosis not present

## 2014-10-21 DIAGNOSIS — F419 Anxiety disorder, unspecified: Secondary | ICD-10-CM | POA: Diagnosis not present

## 2014-10-21 DIAGNOSIS — Z880 Allergy status to penicillin: Secondary | ICD-10-CM | POA: Insufficient documentation

## 2014-10-21 DIAGNOSIS — E785 Hyperlipidemia, unspecified: Secondary | ICD-10-CM | POA: Diagnosis not present

## 2014-10-21 DIAGNOSIS — Z9884 Bariatric surgery status: Secondary | ICD-10-CM | POA: Diagnosis not present

## 2014-10-21 DIAGNOSIS — Z87891 Personal history of nicotine dependence: Secondary | ICD-10-CM | POA: Diagnosis not present

## 2014-10-21 DIAGNOSIS — Z9049 Acquired absence of other specified parts of digestive tract: Secondary | ICD-10-CM | POA: Diagnosis not present

## 2014-10-21 DIAGNOSIS — M7061 Trochanteric bursitis, right hip: Secondary | ICD-10-CM | POA: Diagnosis not present

## 2014-10-21 DIAGNOSIS — Z90710 Acquired absence of both cervix and uterus: Secondary | ICD-10-CM | POA: Insufficient documentation

## 2014-10-21 DIAGNOSIS — M797 Fibromyalgia: Secondary | ICD-10-CM | POA: Diagnosis not present

## 2014-10-21 DIAGNOSIS — K219 Gastro-esophageal reflux disease without esophagitis: Secondary | ICD-10-CM | POA: Insufficient documentation

## 2014-10-21 DIAGNOSIS — S76311A Strain of muscle, fascia and tendon of the posterior muscle group at thigh level, right thigh, initial encounter: Secondary | ICD-10-CM | POA: Diagnosis not present

## 2014-10-21 DIAGNOSIS — Z85828 Personal history of other malignant neoplasm of skin: Secondary | ICD-10-CM | POA: Insufficient documentation

## 2014-10-21 DIAGNOSIS — I10 Essential (primary) hypertension: Secondary | ICD-10-CM | POA: Insufficient documentation

## 2014-10-21 DIAGNOSIS — M25751 Osteophyte, right hip: Secondary | ICD-10-CM | POA: Diagnosis not present

## 2014-10-21 DIAGNOSIS — M7070 Other bursitis of hip, unspecified hip: Secondary | ICD-10-CM | POA: Diagnosis present

## 2014-10-21 HISTORY — PX: EXCISION/RELEASE BURSA HIP: SHX5014

## 2014-10-21 LAB — TYPE AND SCREEN
ABO/RH(D): B POS
Antibody Screen: NEGATIVE

## 2014-10-21 SURGERY — RELEASE, BURSA, TROCHANTERIC
Anesthesia: General | Laterality: Right

## 2014-10-21 MED ORDER — PHENOL 1.4 % MT LIQD: 1.0000 | OROMUCOSAL | Status: DC | PRN

## 2014-10-21 MED ORDER — PRAMIPEXOLE DIHYDROCHLORIDE 0.25 MG PO TABS
0.3750 mg | ORAL_TABLET | Freq: Every day | ORAL | Status: DC
  Administered 2014-10-21: 0.375 mg via ORAL
  Filled 2014-10-21 (×2): qty 1

## 2014-10-21 MED ORDER — DEXAMETHASONE SODIUM PHOSPHATE 10 MG/ML IJ SOLN
10.0000 mg | Freq: Once | INTRAMUSCULAR | Status: DC
  Filled 2014-10-21: qty 1

## 2014-10-21 MED ORDER — LACTATED RINGERS IV SOLN
INTRAVENOUS | Status: DC
  Administered 2014-10-21: 1000 mL via INTRAVENOUS

## 2014-10-21 MED ORDER — GLYCOPYRROLATE 0.2 MG/ML IJ SOLN
INTRAMUSCULAR | Status: DC | PRN
  Administered 2014-10-21: .8 mg via INTRAVENOUS

## 2014-10-21 MED ORDER — SCOPOLAMINE 1 MG/3DAYS TD PT72
MEDICATED_PATCH | TRANSDERMAL | Status: AC
Start: 2014-10-21 — End: 2014-10-21
  Filled 2014-10-21: qty 1

## 2014-10-21 MED ORDER — NEOSTIGMINE METHYLSULFATE 10 MG/10ML IV SOLN
INTRAVENOUS | Status: AC
  Filled 2014-10-21: qty 1

## 2014-10-21 MED ORDER — ALUM & MAG HYDROXIDE-SIMETH 200-200-20 MG/5ML PO SUSP: 30.0000 mL | ORAL | Status: DC | PRN

## 2014-10-21 MED ORDER — LIDOCAINE HCL (CARDIAC) 20 MG/ML IV SOLN
INTRAVENOUS | Status: AC
  Filled 2014-10-21: qty 5

## 2014-10-21 MED ORDER — ONDANSETRON HCL 4 MG/2ML IJ SOLN
INTRAMUSCULAR | Status: DC | PRN
  Administered 2014-10-21: 4 mg via INTRAVENOUS

## 2014-10-21 MED ORDER — HYDROMORPHONE HCL 1 MG/ML IJ SOLN
0.2500 mg | INTRAMUSCULAR | Status: DC | PRN
  Administered 2014-10-21 (×4): 0.5 mg via INTRAVENOUS

## 2014-10-21 MED ORDER — DOCUSATE SODIUM 100 MG PO CAPS
100.0000 mg | ORAL_CAPSULE | Freq: Two times a day (BID) | ORAL | Status: DC
  Administered 2014-10-21 – 2014-10-22 (×2): 100 mg via ORAL

## 2014-10-21 MED ORDER — ASPIRIN EC 325 MG PO TBEC
325.0000 mg | DELAYED_RELEASE_TABLET | Freq: Two times a day (BID) | ORAL | Status: DC
  Administered 2014-10-22: 325 mg via ORAL
  Filled 2014-10-21 (×3): qty 1

## 2014-10-21 MED ORDER — BUPIVACAINE HCL (PF) 0.25 % IJ SOLN
INTRAMUSCULAR | Status: AC
  Filled 2014-10-21: qty 30

## 2014-10-21 MED ORDER — FENTANYL CITRATE (PF) 100 MCG/2ML IJ SOLN
INTRAMUSCULAR | Status: AC
  Administered 2014-10-21: 25 ug via INTRAVENOUS
  Filled 2014-10-21: qty 2

## 2014-10-21 MED ORDER — PREDNISONE 5 MG PO TABS
5.0000 mg | ORAL_TABLET | Freq: Every day | ORAL | Status: DC
  Administered 2014-10-22: 5 mg via ORAL
  Filled 2014-10-21 (×2): qty 1

## 2014-10-21 MED ORDER — METOCLOPRAMIDE HCL 10 MG PO TABS: 5.0000 mg | ORAL_TABLET | Freq: Three times a day (TID) | ORAL | Status: DC | PRN

## 2014-10-21 MED ORDER — ACETAMINOPHEN 500 MG PO TABS: 1000.0000 mg | ORAL_TABLET | Freq: Four times a day (QID) | ORAL | Status: DC | PRN

## 2014-10-21 MED ORDER — CELECOXIB 200 MG PO CAPS
200.0000 mg | ORAL_CAPSULE | Freq: Two times a day (BID) | ORAL | Status: DC
  Administered 2014-10-21 – 2014-10-22 (×2): 200 mg via ORAL
  Filled 2014-10-21 (×3): qty 1

## 2014-10-21 MED ORDER — LIDOCAINE HCL (CARDIAC) 20 MG/ML IV SOLN
INTRAVENOUS | Status: DC | PRN
  Administered 2014-10-21: 50 mg via INTRAVENOUS

## 2014-10-21 MED ORDER — CHLORHEXIDINE GLUCONATE 4 % EX LIQD: 60.0000 mL | Freq: Once | CUTANEOUS | Status: DC

## 2014-10-21 MED ORDER — ONDANSETRON HCL 4 MG PO TABS: 4.0000 mg | ORAL_TABLET | Freq: Four times a day (QID) | ORAL | Status: DC | PRN

## 2014-10-21 MED ORDER — METHOCARBAMOL 1000 MG/10ML IJ SOLN
500.0000 mg | Freq: Four times a day (QID) | INTRAVENOUS | Status: DC | PRN
  Administered 2014-10-21: 500 mg via INTRAVENOUS
  Filled 2014-10-21 (×2): qty 5

## 2014-10-21 MED ORDER — ROCURONIUM BROMIDE 100 MG/10ML IV SOLN
INTRAVENOUS | Status: DC | PRN
Start: 2014-10-21 — End: 2014-10-21
  Administered 2014-10-21: 40 mg via INTRAVENOUS

## 2014-10-21 MED ORDER — SODIUM CHLORIDE 0.9 % IV SOLN
100.0000 mL/h | INTRAVENOUS | Status: DC
  Administered 2014-10-21: 100 mL/h via INTRAVENOUS
  Filled 2014-10-21 (×3): qty 1000

## 2014-10-21 MED ORDER — ONDANSETRON HCL 4 MG/2ML IJ SOLN
INTRAMUSCULAR | Status: AC
  Filled 2014-10-21: qty 2

## 2014-10-21 MED ORDER — PROMETHAZINE HCL 25 MG/ML IJ SOLN: 6.2500 mg | INTRAMUSCULAR | Status: DC | PRN

## 2014-10-21 MED ORDER — METOCLOPRAMIDE HCL 5 MG/ML IJ SOLN: 5.0000 mg | Freq: Three times a day (TID) | INTRAMUSCULAR | Status: DC | PRN

## 2014-10-21 MED ORDER — ZOLPIDEM TARTRATE 5 MG PO TABS: 5.0000 mg | ORAL_TABLET | Freq: Every evening | ORAL | Status: DC | PRN

## 2014-10-21 MED ORDER — POLYETHYLENE GLYCOL 3350 17 G PO PACK
17.0000 g | PACK | Freq: Two times a day (BID) | ORAL | Status: DC
  Administered 2014-10-21 – 2014-10-22 (×2): 17 g via ORAL

## 2014-10-21 MED ORDER — SCOPOLAMINE 1 MG/3DAYS TD PT72
1.0000 | MEDICATED_PATCH | Freq: Once | TRANSDERMAL | Status: DC
  Administered 2014-10-21: 1.5 mg via TRANSDERMAL
  Filled 2014-10-21: qty 1

## 2014-10-21 MED ORDER — CEFAZOLIN SODIUM-DEXTROSE 2-3 GM-% IV SOLR: 2.0000 g | INTRAVENOUS | Status: DC

## 2014-10-21 MED ORDER — MIDAZOLAM HCL 5 MG/5ML IJ SOLN
INTRAMUSCULAR | Status: DC | PRN
  Administered 2014-10-21: 2 mg via INTRAVENOUS

## 2014-10-21 MED ORDER — SERTRALINE HCL 50 MG PO TABS
50.0000 mg | ORAL_TABLET | Freq: Every day | ORAL | Status: DC
  Administered 2014-10-22: 50 mg via ORAL
  Filled 2014-10-21: qty 1

## 2014-10-21 MED ORDER — MAGNESIUM CITRATE PO SOLN: 1.0000 | Freq: Once | ORAL | Status: DC | PRN

## 2014-10-21 MED ORDER — TRANEXAMIC ACID 1000 MG/10ML IV SOLN
1000.0000 mg | Freq: Once | INTRAVENOUS | Status: AC
  Administered 2014-10-21: 1000 mg via INTRAVENOUS
  Filled 2014-10-21: qty 10

## 2014-10-21 MED ORDER — MIDAZOLAM HCL 2 MG/2ML IJ SOLN
INTRAMUSCULAR | Status: AC
  Filled 2014-10-21: qty 4

## 2014-10-21 MED ORDER — DEXAMETHASONE SODIUM PHOSPHATE 10 MG/ML IJ SOLN
10.0000 mg | Freq: Once | INTRAMUSCULAR | Status: AC
  Administered 2014-10-21: 10 mg via INTRAVENOUS

## 2014-10-21 MED ORDER — CEFAZOLIN SODIUM-DEXTROSE 2-3 GM-% IV SOLR
INTRAVENOUS | Status: AC
  Filled 2014-10-21: qty 50

## 2014-10-21 MED ORDER — HYDROMORPHONE HCL 1 MG/ML IJ SOLN
INTRAMUSCULAR | Status: AC
  Filled 2014-10-21: qty 1

## 2014-10-21 MED ORDER — BISACODYL 10 MG RE SUPP: 10.0000 mg | Freq: Every day | RECTAL | Status: DC | PRN

## 2014-10-21 MED ORDER — DEXAMETHASONE SODIUM PHOSPHATE 10 MG/ML IJ SOLN
INTRAMUSCULAR | Status: AC
  Filled 2014-10-21: qty 1

## 2014-10-21 MED ORDER — MENTHOL 3 MG MT LOZG: 1.0000 | LOZENGE | OROMUCOSAL | Status: DC | PRN

## 2014-10-21 MED ORDER — METHOCARBAMOL 500 MG PO TABS
500.0000 mg | ORAL_TABLET | Freq: Four times a day (QID) | ORAL | Status: DC | PRN
  Administered 2014-10-22: 500 mg via ORAL
  Filled 2014-10-21: qty 1

## 2014-10-21 MED ORDER — GLYCOPYRROLATE 0.2 MG/ML IJ SOLN
INTRAMUSCULAR | Status: AC
  Filled 2014-10-21: qty 4

## 2014-10-21 MED ORDER — FERROUS SULFATE 325 (65 FE) MG PO TABS
325.0000 mg | ORAL_TABLET | Freq: Three times a day (TID) | ORAL | Status: DC
  Administered 2014-10-22: 325 mg via ORAL
  Filled 2014-10-21 (×4): qty 1

## 2014-10-21 MED ORDER — FENTANYL CITRATE (PF) 100 MCG/2ML IJ SOLN
INTRAMUSCULAR | Status: DC | PRN
  Administered 2014-10-21: 100 ug via INTRAVENOUS
  Administered 2014-10-21 (×3): 50 ug via INTRAVENOUS

## 2014-10-21 MED ORDER — NEOSTIGMINE METHYLSULFATE 10 MG/10ML IV SOLN
INTRAVENOUS | Status: DC | PRN
  Administered 2014-10-21: 4 mg via INTRAVENOUS

## 2014-10-21 MED ORDER — DIPHENHYDRAMINE HCL 25 MG PO CAPS
25.0000 mg | ORAL_CAPSULE | Freq: Four times a day (QID) | ORAL | Status: DC | PRN
  Administered 2014-10-21: 25 mg via ORAL
  Filled 2014-10-21: qty 1

## 2014-10-21 MED ORDER — ONDANSETRON HCL 4 MG/2ML IJ SOLN: 4.0000 mg | Freq: Four times a day (QID) | INTRAMUSCULAR | Status: DC | PRN

## 2014-10-21 MED ORDER — 0.9 % SODIUM CHLORIDE (POUR BTL) OPTIME
TOPICAL | Status: DC | PRN
  Administered 2014-10-21: 1000 mL

## 2014-10-21 MED ORDER — CEFAZOLIN SODIUM-DEXTROSE 2-3 GM-% IV SOLR
2.0000 g | INTRAVENOUS | Status: AC
  Administered 2014-10-21: 2 g via INTRAVENOUS

## 2014-10-21 MED ORDER — PROPOFOL 10 MG/ML IV BOLUS
INTRAVENOUS | Status: AC
  Filled 2014-10-21: qty 20

## 2014-10-21 MED ORDER — PROPOFOL 10 MG/ML IV BOLUS
INTRAVENOUS | Status: DC | PRN
  Administered 2014-10-21: 150 mg via INTRAVENOUS

## 2014-10-21 MED ORDER — CEFAZOLIN SODIUM-DEXTROSE 2-3 GM-% IV SOLR
2.0000 g | Freq: Four times a day (QID) | INTRAVENOUS | Status: AC
  Administered 2014-10-21 – 2014-10-22 (×2): 2 g via INTRAVENOUS
  Filled 2014-10-21 (×2): qty 50

## 2014-10-21 MED ORDER — FENTANYL CITRATE (PF) 250 MCG/5ML IJ SOLN
INTRAMUSCULAR | Status: AC
  Filled 2014-10-21: qty 25

## 2014-10-21 MED ORDER — HYDROMORPHONE HCL 1 MG/ML IJ SOLN: 0.5000 mg | INTRAMUSCULAR | Status: DC | PRN

## 2014-10-21 MED ORDER — BUPIVACAINE HCL 0.25 % IJ SOLN
INTRAMUSCULAR | Status: DC | PRN
  Administered 2014-10-21: 30 mL

## 2014-10-21 MED ORDER — FENTANYL CITRATE (PF) 100 MCG/2ML IJ SOLN
25.0000 ug | INTRAMUSCULAR | Status: DC | PRN
  Administered 2014-10-21 (×4): 25 ug via INTRAVENOUS

## 2014-10-21 MED ORDER — ESTRADIOL 1 MG PO TABS
1.0000 mg | ORAL_TABLET | Freq: Every day | ORAL | Status: DC
  Administered 2014-10-21 – 2014-10-22 (×2): 1 mg via ORAL
  Filled 2014-10-21 (×2): qty 1

## 2014-10-21 MED ORDER — OXYCODONE HCL 5 MG PO TABS
5.0000 mg | ORAL_TABLET | ORAL | Status: DC
  Administered 2014-10-21: 5 mg via ORAL
  Administered 2014-10-22 (×4): 10 mg via ORAL
  Filled 2014-10-21 (×2): qty 2
  Filled 2014-10-21: qty 1
  Filled 2014-10-21 (×2): qty 2

## 2014-10-21 SURGICAL SUPPLY — 43 items
BAG SPEC THK2 15X12 ZIP CLS (MISCELLANEOUS) ×1
BAG ZIPLOCK 12X15 (MISCELLANEOUS) ×2 IMPLANT
DRAPE ORTHO SPLIT 77X108 STRL (DRAPES) ×2
DRAPE SURG 17X11 SM STRL (DRAPES) ×2 IMPLANT
DRAPE SURG ORHT 6 SPLT 77X108 (DRAPES) ×1 IMPLANT
DRAPE U-SHAPE 47X51 STRL (DRAPES) ×2 IMPLANT
DRSG AQUACEL AG ADV 3.5X14 (GAUZE/BANDAGES/DRESSINGS) ×2 IMPLANT
DRSG EMULSION OIL 3X16 NADH (GAUZE/BANDAGES/DRESSINGS) ×2 IMPLANT
DRSG MEPILEX BORDER 4X4 (GAUZE/BANDAGES/DRESSINGS) IMPLANT
DRSG MEPILEX BORDER 4X8 (GAUZE/BANDAGES/DRESSINGS) IMPLANT
DRSG PAD ABDOMINAL 8X10 ST (GAUZE/BANDAGES/DRESSINGS) IMPLANT
DURAPREP 26ML APPLICATOR (WOUND CARE) ×2 IMPLANT
ELECT BLADE TIP CTD 4 INCH (ELECTRODE) ×2 IMPLANT
ELECT REM PT RETURN 9FT ADLT (ELECTROSURGICAL) ×2
ELECTRODE REM PT RTRN 9FT ADLT (ELECTROSURGICAL) ×1 IMPLANT
GAUZE SPONGE 4X4 12PLY STRL (GAUZE/BANDAGES/DRESSINGS) IMPLANT
GLOVE BIOGEL PI IND STRL 7.5 (GLOVE) ×1 IMPLANT
GLOVE BIOGEL PI IND STRL 8.5 (GLOVE) ×1 IMPLANT
GLOVE BIOGEL PI INDICATOR 7.5 (GLOVE) ×1
GLOVE BIOGEL PI INDICATOR 8.5 (GLOVE) ×1
GLOVE ECLIPSE 8.0 STRL XLNG CF (GLOVE) IMPLANT
GLOVE ORTHO TXT STRL SZ7.5 (GLOVE) ×2 IMPLANT
GLOVE SURG ORTHO 8.0 STRL STRW (GLOVE) ×2 IMPLANT
GOWN SPEC L3 XXLG W/TWL (GOWN DISPOSABLE) ×4 IMPLANT
GOWN STRL REUS W/TWL LRG LVL3 (GOWN DISPOSABLE) ×2 IMPLANT
KIT BASIN OR (CUSTOM PROCEDURE TRAY) ×2 IMPLANT
LIQUID BAND (GAUZE/BANDAGES/DRESSINGS) ×2 IMPLANT
MANIFOLD NEPTUNE II (INSTRUMENTS) ×2 IMPLANT
NEEDLE MA TROC 1/2 (NEEDLE) ×2 IMPLANT
NS IRRIG 1000ML POUR BTL (IV SOLUTION) ×2 IMPLANT
PACK TOTAL JOINT (CUSTOM PROCEDURE TRAY) ×2 IMPLANT
POSITIONER SURGICAL ARM (MISCELLANEOUS) ×2 IMPLANT
STAPLER VISISTAT (STAPLE) IMPLANT
SUT ETHIBOND NAB CT1 #1 30IN (SUTURE) IMPLANT
SUT FIBERWIRE #2 38 T-5 BLUE (SUTURE)
SUT FIBERWIRE #5 38 BLUE (WIRE) ×2 IMPLANT
SUT MNCRL AB 4-0 PS2 18 (SUTURE) ×2 IMPLANT
SUT VIC AB 1 CT1 27 (SUTURE) ×6
SUT VIC AB 1 CT1 27XBRD ANTBC (SUTURE) ×3 IMPLANT
SUT VIC AB 2-0 CT1 27 (SUTURE) ×2
SUT VIC AB 2-0 CT1 TAPERPNT 27 (SUTURE) ×1 IMPLANT
SUTURE FIBERWR #2 38 T-5 BLUE (SUTURE) IMPLANT
TOWEL OR 17X26 10 PK STRL BLUE (TOWEL DISPOSABLE) ×4 IMPLANT

## 2014-10-21 NOTE — Anesthesia Procedure Notes (Signed)

## 2014-10-21 NOTE — Transfer of Care (Signed)
Immediate Anesthesia Transfer of Care Note  Patient: [name]  Procedure(s) Performed: Procedure(s): OPEN RIGHT HIP BURSECTOMY, EXOSECTOMY (Right)  Patient Location: PACU  Anesthesia Type:General  Level of Consciousness:  sedated, patient cooperative and responds to stimulation  Airway & Oxygen Therapy:Patient Spontanous Breathing and Patient connected to face mask oxgen  Post-op Assessment:  Report given to PACU RN and Post -op Vital signs reviewed and stable  Post vital signs:  Reviewed and stable  Last Vitals:  Filed Vitals:   10/21/14 1206  BP: 148/65  Pulse: 60  Temp: 36.4 C  Resp: 18    Complications: No apparent anesthesia complications

## 2014-10-21 NOTE — Anesthesia Postprocedure Evaluation (Signed)
  Anesthesia Post-op Note  Patient: [name]  Procedure(s) Performed: Procedure(s) (LRB): OPEN RIGHT HIP BURSECTOMY, EXOSECTOMY (Right)  Patient Location: PACU  Anesthesia Type: General  Level of Consciousness: awake and alert   Airway and Oxygen Therapy: Patient Spontanous Breathing  Post-op Pain: mild  Post-op Assessment: Post-op Vital signs reviewed, Patient's Cardiovascular Status Stable, Respiratory Function Stable, Patent Airway and No signs of Nausea or vomiting  Last Vitals:  Filed Vitals:   10/21/14 2133  BP: 136/65  Pulse: 65  Temp: 36.8 C  Resp: 16    Post-op Vital Signs: stable   Complications: No apparent anesthesia complications

## 2014-10-21 NOTE — Brief Op Note (Signed)
10/21/2014  4:59 PM  PATIENT:  [name]  59 y.o. female  PRE-OPERATIVE DIAGNOSIS:  1.  Chronic right lateral hip pain, 2.  Right hip bursitis, 3. Right hip gluteal strain verus partial tearing  POST-OPERATIVE DIAGNOSIS:  1.  Chronic right lateral hip pain, 2.  Right hip bursitis, 3. Right hip gluteal strain verus partial tearing, 4. RIGHT hip lateral exostosis  PROCEDURE:  Procedure(s): 1.  OPEN RIGHT HIP BURSECTOMY  (Right) 2.  Right hip exostectomy  SURGEON:  Surgeon(s) and Role:    * Paralee Cancel, MD - Primary  PHYSICIAN ASSISTANT: Danae Orleans, PA-C  ANESTHESIA:   general  EBL:  Total I/O In: -  Out: 50 [Blood:50]  BLOOD ADMINISTERED:none  DRAINS: none   LOCAL MEDICATIONS USED:  Marcaine, local infiltration post-procedure  SPECIMEN:  No Specimen  DISPOSITION OF SPECIMEN:  N/A  COUNTS:  YES  TOURNIQUET:  * No tourniquets in log *  DICTATION: .Other Dictation: Dictation Number [id number]  PLAN OF CARE: Admit for overnight observation  PATIENT DISPOSITION:  PACU - hemodynamically stable.   Delay start of Pharmacological VTE agent (>24hrs) due to surgical blood loss or risk of bleeding: no

## 2014-10-21 NOTE — Interval H&P Note (Signed)
History and Physical Interval Note:  10/21/2014 3:11 PM  [name]  has presented today for surgery, with the diagnosis of RIGHT LATERAL HIP PAIN GLUTEUS TEAR   The various methods of treatment have been discussed with the patient and family. After consideration of risks, benefits and other options for treatment, the patient has consented to  Procedure(s): OPEN RIGHT HIP BURSECTOMY  (Right) OPEN GLUTEUS MEDIUS/ MINIMUS REPAIR  (Right) as a surgical intervention .  The patient's history has been reviewed, patient examined, no change in status, stable for surgery.  I have reviewed the patient's chart and labs.  Questions were answered to the patient's satisfaction.     Mauri Pole

## 2014-10-21 NOTE — Anesthesia Preprocedure Evaluation (Addendum)
Anesthesia Evaluation  Patient identified by MRN, date of birth, ID band Patient awake    Reviewed: Allergy & Precautions, NPO status , Patient's Chart, lab work & pertinent test results  History of Anesthesia Complications (+) PONV and history of anesthetic complications  Airway Mallampati: II  TM Distance: >3 FB Neck ROM: Full    Dental no notable dental hx.    Pulmonary pneumonia -, resolved, former smoker,  breath sounds clear to auscultation  Pulmonary exam normal       Cardiovascular Exercise Tolerance: Good hypertension, Pt. on medications Normal cardiovascular examRhythm:Regular Rate:Normal     Neuro/Psych PSYCHIATRIC DISORDERS Anxiety Depression  Neuromuscular disease    GI/Hepatic Neg liver ROS, hiatal hernia, GERD-  ,  Endo/Other  negative endocrine ROS  Renal/GU negative Renal ROS  negative genitourinary   Musculoskeletal  (+) Arthritis -, Rheumatoid disorders,  Fibromyalgia -, narcotic dependent  Abdominal   Peds negative pediatric ROS (+)  Hematology  (+) anemia ,   Anesthesia Other Findings   Reproductive/Obstetrics negative OB ROS                            Anesthesia Physical Anesthesia Plan  ASA: II  Anesthesia Plan: General   Post-op Pain Management:    Induction: Intravenous  Airway Management Planned: Oral ETT  Additional Equipment:   Intra-op Plan:   Post-operative Plan: Extubation in OR  Informed Consent: I have reviewed the patients History and Physical, chart, labs and discussed the procedure including the risks, benefits and alternatives for the proposed anesthesia with the patient or authorized representative who has indicated his/her understanding and acceptance.   Dental advisory given  Plan Discussed with: CRNA  Anesthesia Plan Comments:         Anesthesia Quick Evaluation

## 2014-10-22 ENCOUNTER — Encounter (HOSPITAL_COMMUNITY): Payer: Self-pay | Admitting: Orthopedic Surgery

## 2014-10-22 DIAGNOSIS — M25751 Osteophyte, right hip: Secondary | ICD-10-CM | POA: Diagnosis not present

## 2014-10-22 DIAGNOSIS — K219 Gastro-esophageal reflux disease without esophagitis: Secondary | ICD-10-CM | POA: Diagnosis not present

## 2014-10-22 DIAGNOSIS — M7070 Other bursitis of hip, unspecified hip: Secondary | ICD-10-CM | POA: Diagnosis present

## 2014-10-22 DIAGNOSIS — M899 Disorder of bone, unspecified: Secondary | ICD-10-CM | POA: Diagnosis not present

## 2014-10-22 DIAGNOSIS — M25551 Pain in right hip: Secondary | ICD-10-CM | POA: Diagnosis not present

## 2014-10-22 DIAGNOSIS — E785 Hyperlipidemia, unspecified: Secondary | ICD-10-CM | POA: Diagnosis not present

## 2014-10-22 DIAGNOSIS — M7071 Other bursitis of hip, right hip: Secondary | ICD-10-CM | POA: Diagnosis not present

## 2014-10-22 LAB — CBC
HCT: 35.4 % — ABNORMAL LOW (ref 36.0–46.0)
Hemoglobin: 10.7 g/dL — ABNORMAL LOW (ref 12.0–15.0)
MCH: 27.6 pg (ref 26.0–34.0)
MCHC: 30.2 g/dL (ref 30.0–36.0)
MCV: 91.5 fL (ref 78.0–100.0)
Platelets: 313 10*3/uL (ref 150–400)
RBC: 3.87 MIL/uL (ref 3.87–5.11)
RDW: 13.7 % (ref 11.5–15.5)
WBC: 8.4 10*3/uL (ref 4.0–10.5)

## 2014-10-22 LAB — BASIC METABOLIC PANEL
Anion gap: 7 (ref 5–15)
BUN: 13 mg/dL (ref 6–20)
CO2: 27 mmol/L (ref 22–32)
Calcium: 8.5 mg/dL — ABNORMAL LOW (ref 8.9–10.3)
Chloride: 106 mmol/L (ref 101–111)
Creatinine, Ser: 0.56 mg/dL (ref 0.44–1.00)
GFR calc Af Amer: >60 mL/min (ref >60)
GFR calc non Af Amer: >60 mL/min (ref >60)
Glucose, Bld: 133 mg/dL — ABNORMAL HIGH (ref 65–99)
Potassium: 4.2 mmol/L (ref 3.5–5.1)
Sodium: 140 mmol/L (ref 135–145)

## 2014-10-22 MED ORDER — FERROUS SULFATE 325 (65 FE) MG PO TABS: 325.0000 mg | ORAL_TABLET | Freq: Two times a day (BID) | ORAL | Status: DC

## 2014-10-22 MED ORDER — OXYCODONE HCL 5 MG PO TABS: 5.0000 mg | ORAL_TABLET | ORAL | Status: DC | PRN

## 2014-10-22 MED ORDER — METHOCARBAMOL 500 MG PO TABS: 500.0000 mg | ORAL_TABLET | Freq: Four times a day (QID) | ORAL | Status: DC | PRN

## 2014-10-22 MED ORDER — DOCUSATE SODIUM 100 MG PO CAPS: 100.0000 mg | ORAL_CAPSULE | Freq: Two times a day (BID) | ORAL | Status: DC

## 2014-10-22 MED ORDER — POLYETHYLENE GLYCOL 3350 17 G PO PACK: 17.0000 g | PACK | Freq: Every day | ORAL | Status: DC | PRN

## 2014-10-22 MED ORDER — ASPIRIN 325 MG PO TBEC: 325.0000 mg | DELAYED_RELEASE_TABLET | Freq: Every day | ORAL | Status: DC

## 2014-10-22 NOTE — Progress Notes (Signed)
OT Cancellation Note  Patient Details Name: [name] MRN: [id number] DOB: 1955/03/06   Cancelled Treatment:    Reason Eval/Treat Not Completed: OT screened, no needs identified, will sign off  Cleave Ternes 10/22/2014, 11:18 AM  Lesle Chris, OTR/L [phone] 10/22/2014

## 2014-10-22 NOTE — Progress Notes (Signed)
Pt to d/c home. No DME or HH require/needed. AVS reviewed and "My Chart" discussed with pt. Pt capable of verbalizing medications, dressing changes, signs and symptoms of infection, and follow-up appointments. Remains hemodynamically stable. No signs and symptoms of distress. Educated pt to return to ER in the case of SOB, dizziness, or chest pain.

## 2014-10-22 NOTE — Progress Notes (Signed)
Patient ID: [name], female   DOB: [birth year], 59 y.o.   MRN: [id number] Subjective: 1 Day Post-Op Procedure(s) (LRB): OPEN RIGHT HIP BURSECTOMY, EXOSECTOMY (Right)    Patient reports pain as mild.  Reviewed intra-operative findings and procedure performed  Objective:   VITALS:   Filed Vitals:   10/22/14 0546  BP: 108/48  Pulse: 54  Temp: 98.2 F (36.8 C)  Resp: 15    Neurovascular intact Incision: dressing C/D/I  LABS  Recent Labs  10/22/14 0553  HGB 10.7*  HCT 35.4*  WBC 8.4  PLT 313     Recent Labs  10/22/14 0553  NA 140  K 4.2  BUN 13  CREATININE 0.56  GLUCOSE 133*    No results for input(s): LABPT, INR in the last 72 hours.   Assessment/Plan: 1 Day Post-Op Procedure(s) (LRB): OPEN RIGHT HIP BURSECTOMY, EXOSECTOMY (Right)   Advance diet Up with therapy Discharge home, no therapy necessary

## 2014-10-22 NOTE — Op Note (Signed)
NAMESHAQUETA, CASADY               ACCOUNT NO.:  [id number]  MEDICAL RECORD NO.:  [id number]  LOCATION:  0932                         FACILITY:  [facility]  PHYSICIAN:  Pietro Cassis. Alvan Dame, M.D.  DATE OF BIRTH:  1955/10/24  DATE OF PROCEDURE:  10/21/2014 DATE OF DISCHARGE:                              OPERATIVE REPORT   PREOPERATIVE DIAGNOSES: 1. Recalcitrant and recurrent right lateral hip pain. 2. Right hip bursitis. 3. Concern for right hip gluteal strain versus partial tearing     following previous repair.  POSTOPERATIVE DIAGNOSES/FINDINGS: 1. Recalcitrant and recurrent right lateral hip pain. 2. Right hip bursitis. 3. Concern for right hip gluteal strain versus partial tearing     following previous repair. 4. Right hip lateral exostosis.  This was recognized to be a spicule     of bone that was about 3 cm long extending from the anterior     lateral trochanter near vastus ridge, heading in a distal     direction.  This was very prominent in the soft tissues.  PROCEDURES: 1. Right knee open hip bursectomy. 2. Right hip lateral exostectomy.  SURGEON:  Pietro Cassis. Alvan Dame, M.D.  ASSISTANT:  Danae Orleans, P.A.  Note, Mr. Guinevere Scarlet was present for the entire procedure from preoperative positioning to general facilitation of the case and primary wound closure.  ANESTHESIA:  General.  SPECIMENS:  None.  COMPLICATIONS:  None.  DRAINS:  None.  ESTIMATED BLOOD LOSS:  About 50 mL.  INDICATIONS FOR PROCEDURE:  Ms. Silos is a 59 year old female, patient of mine.  Approximately 2 years ago, she had persistent recurrent lateral hip pain and underwent an open bursectomy and primary repair of her gluteus minimus and medius.  She has been followed recently with persistent recurrent problems of this lateral side of the hip that I tried medications, injections, physical therapy, but she has persistent recurrent problems.  MRI had revealed findings of previous repair with persistent  partial tearing.  I discussed with her at this point that this would be a common finding as comparison to rotator cuff pathology, however, I could not fully explain her persistent pain.  After a lengthy discussion, she wished and we then elected to perform an open bursectomy, exploration of the wound, and possible re-repair of the gluteus tendons, limitation to success this operation, the potential for muscle atrophy, and limited repair were reviewed in addition to the persistence of her pain.  Consent was obtained for benefit of pain relief.  PROCEDURE IN DETAIL:  The patient was brought to operative theater. Once adequate anesthesia, preoperative antibiotics, Ancef administered, she was positioned into the left lateral decubitus position with the right side up, and the right lower extremity was then prepped and draped in a sterile fashion.  Time-out was performed, identifying the patient, planned procedure, and extremity.  Her old incision was identified and excised.  Soft tissue dissection was carried down to the iliotibial band and gluteal fascia.  Her overall muscle tissues are very soft and mobile.  The iliotibial band and gluteal fascia were then split longitudinally along the orientation of the lateral femur.  We did encounter clear bursal fluid in this area.  Upon entering the  sac, I performed an open bursectomy circumferentially in this pertrochanteric area.  At this point, we identified this large exostosis or bone formation.  This is a 59 year old female, but had appearance of an osteochondroma in its appearance.  Base to tip was probably about 3 cm in length.  I felt that this easily could be a significant source of her pain laterally.  I exposed this, dissecting around it using a Bovie cautery. Once the base was exposed, I used an osteotome and amputated at the base, performing exostectomy.  I used a rongeur to smooth out remaining bone edges.  I was satisfied with  the appearance as well as with the palpation.  Further exploration of the wound indicated old suture from her Ethibond repair.  I was able to palpate the islands of repaired tendon.  Her muscle did have an overall atrophic appearance, however, did appear that there were points of bone ingrowth of this repair.  Based on accumulation of these findings; bursal fluid, this exostosis identified, then the findings of some gluteal tendon repair, I elected not to take down the gluteus tendon and try to repair as I felt that this had added morbidity and the postop course could not be isolated out from the effect of this bone growth identified.  I thus irrigated the wound copiously with normal saline and solution.  We reapproximated the iliotibial band and gluteal fascia proximally using #1 Vicryl.  The remaining wound was closed with 2-0 Vicryl and a running 3-0 Monocryl. The hip was then cleaned, dried, and dressed sterilely with Dermabond and Aquacel dressing.  We did infiltrate 30 mL of 0.25% Marcaine with epinephrine into the hip area to provide some initial pain relief postoperatively.  Please note, she will be weightbearing as tolerated.  Findings were reviewed with the patient.     Pietro Cassis Alvan Dame, M.D.     MDO/MEDQ  D:  10/21/2014  T:  10/22/2014  Job:  [id number]

## 2014-10-22 NOTE — Evaluation (Signed)
Physical Therapy Evaluation Patient Details Name: [name] MRN: [id number] DOB: 1956-02-01 Today's Date: 10/22/2014   History of Present Illness  59 yo female s/p R open hip bursectomy, R hip lateral exostectomy 10/21/14.   Clinical Impression  On eval, pt was Min guard assist for mobility-walked ~75 feet with RW. Pain rated 6/10. Pt did not feel she needed to practice ascending 1 step-verbalized technique for her. Performed APs, QSs, heel slides (hip/knee flexion) x 10. Did not instruct pt in hip abd/add exercises as to err on the side of safety since she has a possible partial hip muscle tear. Instructed pt to use RW for ambulation.    Follow Up Recommendations No PT follow up    Equipment Recommendations  None recommended by PT    Recommendations for Other Services       Precautions / Restrictions Precautions Precautions: Fall Precaution Comments: did not give pt any active hip abd/add exercises just to be cautious due to possible partial hip muscle tear Restrictions Weight Bearing Restrictions: No RLE Weight Bearing: Weight bearing as tolerated      Mobility  Bed Mobility Overal bed mobility: Needs Assistance Bed Mobility: Supine to Sit     Supine to sit: HOB elevated;Min guard     General bed mobility comments: close guard for safety  Transfers Overall transfer level: Needs assistance Equipment used: Rolling walker (2 wheeled) Transfers: Sit to/from Stand Sit to Stand: Min guard         General transfer comment: VCs safety, technique, hand placement. close guard for safety  Ambulation/Gait Ambulation/Gait assistance: Min guard Ambulation Distance (Feet): 75 Feet Assistive device: Rolling walker (2 wheeled) Gait Pattern/deviations: Step-through pattern;Decreased stride length;Antalgic     General Gait Details: close guard for safety. VCs sequence. Pt using step to pattern for the most part. Pt did report some feeling of R LE instability while  ambulating.   Stairs            Wheelchair Mobility    Modified Rankin (Stroke Patients Only)       Balance                                             Pertinent Vitals/Pain Pain Assessment: 0-10 Pain Score: 6  Pain Location: R hip with activity Pain Descriptors / Indicators: Sore Pain Intervention(s): Monitored during session;Repositioned;Ice applied    Home Living Family/patient expects to be discharged to:: Private residence Living Arrangements: Spouse/significant other Available Help at Discharge: Family Type of Home: House Home Access: Stairs to enter Entrance Stairs-Rails: None Entrance Stairs-Number of Steps: 1 Home Layout: One level Home Equipment: Environmental consultant - 2 wheels;Tub bench;Shower seat - built in;Grab bars - tub/shower;Grab bars - toilet      Prior Function Level of Independence: Independent               Hand Dominance        Extremity/Trunk Assessment   Upper Extremity Assessment: Defer to OT evaluation           Lower Extremity Assessment: RLE deficits/detail;Generalized weakness      Cervical / Trunk Assessment: Normal  Communication   Communication: No difficulties  Cognition Arousal/Alertness: Awake/alert Behavior During Therapy: WFL for tasks assessed/performed Overall Cognitive Status: Within Functional Limits for tasks assessed  General Comments      Exercises General Exercises - Lower Extremity Ankle Circles/Pumps: AROM;Both;10 reps;Supine Quad Sets: AROM;10 reps;Both;Supine Heel Slides: AROM;Right;10 reps;Supine      Assessment/Plan    PT Assessment Patent does not need any further PT services  PT Diagnosis Acute pain;Difficulty walking   PT Problem List Decreased strength;Decreased range of motion;Decreased activity tolerance;Decreased balance;Decreased mobility;Decreased knowledge of use of DME;Pain  PT Treatment Interventions     PT Goals (Current goals can  be found in the Care Plan section) Acute Rehab PT Goals Patient Stated Goal: less pain. regain PLOF PT Goal Formulation: All assessment and education complete, DC therapy    Frequency     Barriers to discharge        Co-evaluation               End of Session   Activity Tolerance: Patient tolerated treatment well Patient left: in chair;with call bell/phone within reach;with family/visitor present      Functional Assessment Tool Used: clinicall judgement Functional Limitation: Mobility: Walking and moving around Mobility: Walking and Moving Around Current Status (H8887): At least 1 percent but less than 20 percent impaired, limited or restricted Mobility: Walking and Moving Around Goal Status [phone]): At least 1 percent but less than 20 percent impaired, limited or restricted Mobility: Walking and Moving Around Discharge Status [phone]): At least 1 percent but less than 20 percent impaired, limited or restricted    Time: 1005-1027 PT Time Calculation (min) (ACUTE ONLY): 22 min   Charges:   PT Evaluation $Initial PT Evaluation Tier I: 1 Procedure     PT G Codes:   PT G-Codes **NOT FOR INPATIENT CLASS** Functional Assessment Tool Used: clinicall judgement Functional Limitation: Mobility: Walking and moving around Mobility: Walking and Moving Around Current Status (R5615): At least 1 percent but less than 20 percent impaired, limited or restricted Mobility: Walking and Moving Around Goal Status [phone]): At least 1 percent but less than 20 percent impaired, limited or restricted Mobility: Walking and Moving Around Discharge Status [phone]): At least 1 percent but less than 20 percent impaired, limited or restricted    Weston Anna, MPT Pager: [phone]

## 2014-10-29 NOTE — Discharge Summary (Signed)
Physician Discharge Summary  Patient ID: [name] MRN: [id number] DOB/AGE: 59-May-1957 59 y.o.  Admit date: 10/21/2014 Discharge date: 10/22/2014   Procedures:  Procedure(s) (LRB): OPEN RIGHT HIP BURSECTOMY, EXOSECTOMY (Right)  Attending Physician:  Dr. Paralee Cancel   Admission Diagnoses:   Right lateral hip pain, gluteus medius/minimus tear and bursitis   Discharge Diagnoses:  Active Problems:   Osteophyte of hip   Hip bursitis  Past Medical History  Diagnosis Date  . ANGIOMA 10/03/2008    REMOVED FROM RIGHT LOWER LEG-BENIGN  . HYPERLIPIDEMIA 08/27/2008  . GERD 08/27/2008  . SEBORRHEIC KERATOSIS 08/27/2008  . Arthritis     RA  . Fibromyalgia   . Complication of anesthesia   . PONV (postoperative nausea and vomiting)   . History of nonmelanoma skin cancer   . Cancer     BASAL CELL SKIN CANCER  . HYPERTENSION 08/27/2008    pt is currently on no meds   . Pneumonia     hx of   . Bronchitis     hx of   . Anxiety   . History of hiatal hernia     hx of has had surgically repaired  . History of blood transfusion     1995  . History of measles   . History of mumps     HPI:    Pt is a 59 y.o. female complaining of right hip pain for 6+ months. Pain had continually increased since the beginning. MRI of the right hip reveal possible gluteal tear. She has previously had a bursectomy of the right hip and repair of the gluteal musculature with good success. She feels that this is similar to the previous symptoms and wishes to try the procedure again. Dr. Alvan Dame has had a long discussion with the patient stating that he is unsure of the success being that it will be the second time doing the surgery and gives her 50% chance of the surgery relieving her symptoms. She understands the chance of success and still wishes to proceed, especially with the success of the first surgery to this point. Pt has tried various conservative treatments which have failed to alleviate their  symptoms, including NSAIDs, cortisone injections and activity modifications. Various options are discussed with the patient. Risks, benefits and expectations were discussed with the patient. Patient understand the risks, benefits and expectations and wishes to proceed with surgery.   PCP: Eulas Post, MD   Discharged Condition: good  Hospital Course:  Patient underwent the above stated procedure on 10/21/2014. Patient tolerated the procedure well and brought to the recovery room in good condition and subsequently to the floor.  POD #1 BP: 108/48 ; Pulse: 54 ; Temp: 98.2 F (36.8 C) ; Resp: 15 Patient reports pain as mild. Reviewed intra-operative findings and procedure performed.  Ready to be discharged home. Dorsiflexion/plantar flexion intact, incision: dressing C/D/I, no cellulitis present and compartment soft.   LABS  Basename    HGB  10.7  HCT  35.4    Discharge Exam: General appearance: alert, cooperative and no distress Extremities: Homans sign is negative, no sign of DVT, no edema, redness or tenderness in the calves or thighs and no ulcers, gangrene or trophic changes  Disposition: Home with follow up in 2 weeks   Follow-up Information    Follow up with Mauri Pole, MD. Schedule an appointment as soon as possible for a visit in 2 weeks.   Specialty:  Orthopedic Surgery   Contact information:  [address] [place] [address] [phone]       Discharge Instructions    Call MD / Call 911    Complete by:  As directed   If you experience chest pain or shortness of breath, CALL 911 and be transported to the hospital emergency room.  If you develope a fever above 101 F, pus (white drainage) or increased drainage or redness at the wound, or calf pain, call your surgeon's office.     Constipation Prevention    Complete by:  As directed   Drink plenty of fluids.  Prune juice may be helpful.  You may use a stool softener, such as Colace (over  the counter) 100 mg twice a day.  Use MiraLax (over the counter) for constipation as needed.     Diet - low sodium heart healthy    Complete by:  As directed      Discharge instructions    Complete by:  As directed   Maintain surgical dressing until follow up in the clinic. If the edges start to pull up, may reinforce with tape. If the dressing is no longer working, may remove and cover with gauze and tape, but must keep the area dry and clean.  Follow up in 2 weeks at [facility]. Call with any questions or concerns.     Increase activity slowly as tolerated    Complete by:  As directed   Weight bearing as tolerated with assist device (walker, cane, etc) as directed, use it as long as suggested by your surgeon or therapist, typically at least 4-6 weeks.             Medication List    STOP taking these medications        oxyCODONE-acetaminophen 5-325 MG per tablet  Commonly known as:  PERCOCET/ROXICET      TAKE these medications        acetaminophen 500 MG tablet  Commonly known as:  TYLENOL  Take 1,000 mg by mouth every 6 (six) hours as needed for pain.     aspirin 325 MG EC tablet  Take 1 tablet (325 mg total) by mouth daily.     B-complex with vitamin C tablet  Take 1 tablet by mouth daily.     Biotin 1000 MCG tablet  Take 3,000 mcg by mouth daily.     calcium-vitamin D 500-200 MG-UNIT per tablet  Commonly known as:  OSCAL WITH D  Take 2 tablets by mouth 2 (two) times daily.     docusate sodium 100 MG capsule  Commonly known as:  COLACE  Take 1 capsule (100 mg total) by mouth 2 (two) times daily.     EPINEPHrine 0.3 mg/0.3 mL Soaj injection  Commonly known as:  EPIPEN 2-PAK  Inject 0.3 mLs (0.3 mg total) into the muscle once.     estradiol 1 MG tablet  Commonly known as:  ESTRACE  Take 1 tablet (1 mg total) by mouth daily.     etanercept 50 MG/ML injection  Commonly known as:  ENBREL  Inject 0.98 mLs (50 mg total) into the skin once a week.  Resume end of next week     ferrous sulfate 325 (65 FE) MG tablet  Take 1 tablet (325 mg total) by mouth 2 (two) times daily with a meal.     FLINSTONES GUMMIES OMEGA-3 DHA PO  Take 4 tablets by mouth daily.     folic acid 1 MG tablet  Commonly known  as:  FOLVITE  Take 2 mg by mouth daily.     hydroxychloroquine 200 MG tablet  Commonly known as:  PLAQUENIL  Take 200 mg by mouth 2 (two) times daily.     methocarbamol 500 MG tablet  Commonly known as:  ROBAXIN  Take 1 tablet (500 mg total) by mouth every 6 (six) hours as needed for muscle spasms.     methotrexate 1 gm Solr  Commonly known as:  50 mg/ml  May resume previous dose at  end of next week     ondansetron 8 MG disintegrating tablet  Commonly known as:  ZOFRAN ODT  Take 1 tablet (8 mg total) by mouth every 8 (eight) hours as needed for nausea or vomiting.     oxyCODONE 5 MG immediate release tablet  Commonly known as:  Oxy IR/ROXICODONE  Take 1-2 tablets (5-10 mg total) by mouth every 4 (four) hours as needed for severe pain.     polyethylene glycol packet  Commonly known as:  MIRALAX / GLYCOLAX  Take 17 g by mouth daily as needed.     pramipexole 0.125 MG tablet  Commonly known as:  MIRAPEX  TAKE 3 TABLETS AT BEDTIME     predniSONE 5 MG tablet  Commonly known as:  DELTASONE  Take 5 mg by mouth daily with breakfast.     scopolamine 1 MG/3DAYS  Commonly known as:  TRANSDERM-SCOP  Place 1 patch (1.5 mg total) onto the skin every 3 (three) days. Behind ear     sertraline 50 MG tablet  Commonly known as:  ZOLOFT  TAKE 1 TABLET DAILY     zolpidem 10 MG tablet  Commonly known as:  AMBIEN  Take 1 tablet (10 mg total) by mouth at bedtime as needed for sleep.         Signed: West Pugh. Isatu Macinnes   PA-C  10/29/2014, 12:18 PM

## 2014-11-27 ENCOUNTER — Ambulatory Visit (INDEPENDENT_AMBULATORY_CARE_PROVIDER_SITE_OTHER): Payer: BLUE CROSS/BLUE SHIELD | Admitting: Family Medicine

## 2014-11-27 DIAGNOSIS — Z23 Encounter for immunization: Secondary | ICD-10-CM | POA: Diagnosis not present

## 2014-12-06 ENCOUNTER — Ambulatory Visit (INDEPENDENT_AMBULATORY_CARE_PROVIDER_SITE_OTHER): Payer: BLUE CROSS/BLUE SHIELD | Admitting: Adult Health

## 2014-12-06 ENCOUNTER — Encounter: Payer: Self-pay | Admitting: Adult Health

## 2014-12-06 DIAGNOSIS — M5416 Radiculopathy, lumbar region: Secondary | ICD-10-CM

## 2014-12-06 DIAGNOSIS — M5431 Sciatica, right side: Secondary | ICD-10-CM

## 2014-12-06 MED ORDER — CYCLOBENZAPRINE HCL 10 MG PO TABS: 10.0000 mg | ORAL_TABLET | Freq: Three times a day (TID) | ORAL | Status: DC | PRN

## 2014-12-06 MED ORDER — PREDNISONE 10 MG PO TABS: 10.0000 mg | ORAL_TABLET | Freq: Every day | ORAL | Status: DC

## 2014-12-06 MED ORDER — KETOROLAC TROMETHAMINE 60 MG/2ML IM SOLN
60.0000 mg | Freq: Once | INTRAMUSCULAR | Status: AC
  Administered 2014-12-06: 60 mg via INTRAMUSCULAR

## 2014-12-06 NOTE — Patient Instructions (Addendum)
It was great meeting you today!  Your exam is consistent with sciatica  I have sent in a prescription for Flexeril and Prednisone. Please take the prednisone as directed  Day 1 60 mg Day 2 60 mg Day 3 60 mg Day 4 40 mg Day 5 40 mg Day 6 40 mg Day 7 20 mg Day 8 20 mg  Day 9 20 mg  Use tylenol or your prescribed Roxicodone as needed.   Follow up if no improvement.   Sciatica Sciatica is pain, weakness, numbness, or tingling along the path of the sciatic nerve. The nerve starts in the lower back and runs down the back of each leg. The nerve controls the muscles in the lower leg and in the back of the knee, while also providing sensation to the back of the thigh, lower leg, and the sole of your foot. Sciatica is a symptom of another medical condition. For instance, nerve damage or certain conditions, such as a herniated disk or bone spur on the spine, pinch or put pressure on the sciatic nerve. This causes the pain, weakness, or other sensations normally associated with sciatica. Generally, sciatica only affects one side of the body. CAUSES   Herniated or slipped disc.  Degenerative disk disease.  A pain disorder involving the narrow muscle in the buttocks (piriformis syndrome).  Pelvic injury or fracture.  Pregnancy.  Tumor (rare). SYMPTOMS  Symptoms can vary from mild to very severe. The symptoms usually travel from the low back to the buttocks and down the back of the leg. Symptoms can include:  Mild tingling or dull aches in the lower back, leg, or hip.  Numbness in the back of the calf or sole of the foot.  Burning sensations in the lower back, leg, or hip.  Sharp pains in the lower back, leg, or hip.  Leg weakness.  Severe back pain inhibiting movement. These symptoms may get worse with coughing, sneezing, laughing, or prolonged sitting or standing. Also, being overweight may worsen symptoms. DIAGNOSIS  Your caregiver will perform a physical exam to look for  common symptoms of sciatica. He or she may ask you to do certain movements or activities that would trigger sciatic nerve pain. Other tests may be performed to find the cause of the sciatica. These may include:  Blood tests.  X-rays.  Imaging tests, such as an MRI or CT scan. TREATMENT  Treatment is directed at the cause of the sciatic pain. Sometimes, treatment is not necessary and the pain and discomfort goes away on its own. If treatment is needed, your caregiver may suggest:  Over-the-counter medicines to relieve pain.  Prescription medicines, such as anti-inflammatory medicine, muscle relaxants, or narcotics.  Applying heat or ice to the painful area.  Steroid injections to lessen pain, irritation, and inflammation around the nerve.  Reducing activity during periods of pain.  Exercising and stretching to strengthen your abdomen and improve flexibility of your spine. Your caregiver may suggest losing weight if the extra weight makes the back pain worse.  Physical therapy.  Surgery to eliminate what is pressing or pinching the nerve, such as a bone spur or part of a herniated disk. HOME CARE INSTRUCTIONS   Only take over-the-counter or prescription medicines for pain or discomfort as directed by your caregiver.  Apply ice to the affected area for 20 minutes, 3-4 times a day for the first 48-72 hours. Then try heat in the same way.  Exercise, stretch, or perform your usual activities if these  do not aggravate your pain.  Attend physical therapy sessions as directed by your caregiver.  Keep all follow-up appointments as directed by your caregiver.  Do not wear high heels or shoes that do not provide proper support.  Check your mattress to see if it is too soft. A firm mattress may lessen your pain and discomfort. SEEK IMMEDIATE MEDICAL CARE IF:   You lose control of your bowel or bladder (incontinence).  You have increasing weakness in the lower back, pelvis, buttocks, or  legs.  You have redness or swelling of your back.  You have a burning sensation when you urinate.  You have pain that gets worse when you lie down or awakens you at night.  Your pain is worse than you have experienced in the past.  Your pain is lasting longer than 4 weeks.  You are suddenly losing weight without reason. MAKE SURE YOU:  Understand these instructions.  Will watch your condition.  Will get help right away if you are not doing well or get worse.   This information is not intended to replace advice given to you by your health care provider. Make sure you discuss any questions you have with your health care provider.   Document Released: 02/02/2001 Document Revised: 10/30/2014 Document Reviewed: 06/20/2011 Elsevier Interactive Patient Education [employer].

## 2014-12-06 NOTE — Progress Notes (Signed)
Subjective:    Patient ID: [name], female    DOB: 1956-02-13, 59 y.o.   MRN: [id number]  HPI  59 year old female who presents to the office today for right leg pain. She endorses that this pain has nothing to do with the hip surgery she had 6 weeks ago. This pain feels like a " burning sharp" pain. Starts in her right lower back and travels down her leg to the back of her knee and down into her foot. This has been going on for three weeks and has not been improving. Standing up helps with the pain,. She is having difficulty sleeping due to the pain.   Denies any swelling, warmness, inflammation or discharge from surgical site.    Review of Systems  Constitutional: Positive for activity change.  Respiratory: Negative.   Cardiovascular: Negative.   Musculoskeletal: Positive for myalgias, back pain and arthralgias. Negative for joint swelling, neck pain and neck stiffness.  Skin: Negative.   Neurological: Negative.   Psychiatric/Behavioral: Positive for sleep disturbance.  All other systems reviewed and are negative.  Past Medical History  Diagnosis Date  . ANGIOMA 10/03/2008    REMOVED FROM RIGHT LOWER LEG-BENIGN  . HYPERLIPIDEMIA 08/27/2008  . GERD 08/27/2008  . SEBORRHEIC KERATOSIS 08/27/2008  . Arthritis     RA  . Fibromyalgia   . Complication of anesthesia   . PONV (postoperative nausea and vomiting)   . History of nonmelanoma skin cancer   . Cancer (HCC)     BASAL CELL SKIN CANCER  . HYPERTENSION 08/27/2008    pt is currently on no meds   . Pneumonia     hx of   . Bronchitis     hx of   . Anxiety   . History of hiatal hernia     hx of has had surgically repaired  . History of blood transfusion     1995  . History of measles   . History of mumps     Social History   Social History  . Marital Status: Married    Spouse Name: N/A  . Number of Children: N/A  . Years of Education: N/A   Occupational History  . Not on file.   Social History Main Topics  .  Smoking status: Former Smoker -- 0.25 packs/day for 4 years    Types: Cigarettes    Quit date: 02/23/1984  . Smokeless tobacco: Never Used  . Alcohol Use: No  . Drug Use: No  . Sexual Activity: Not on file   Other Topics Concern  . Not on file   Social History Narrative    Past Surgical History  Procedure Laterality Date  . Cesarean section  1980    times 2; 1980 and 1982  . Cesarean section  1982  . Cholecystectomy  1997  . Abdominal hysterectomy  1995  . Foot surgery  2004, 2005, 2008    bilat secondary to neuropathy   . Knee arthroscopy  2007, 2008, 2009  . Nasal sinus surgery  2013  . Breast ductal system excision  08/16/2011    Procedure: EXCISION DUCTAL SYSTEM BREAST;  Surgeon: Joyice Faster. Cornett, MD;  Location: [facility];  Service: General;  Laterality: Left;  left breast duct excision  . Open surgical repair of gluteal tendon  01/17/2012    Procedure: OPEN SURGICAL REPAIR OF GLUTEAL TENDON;  Surgeon: Mauri Pole, MD;  Location: [facility];  Service: Orthopedics;  Laterality: Right;  .  Joint replacement  10,12    lt total knee X2  . Laparoscopic gastric sleeve resection N/A 10/30/2013    Procedure: LAPAROSCOPIC GASTRIC SLEEVE RESECTION WITH HIATAL HERNIA REPAIR AND UPPER ENDOSCOPY;  Surgeon: Greer Pickerel, MD;  Location: [facility];  Service: General;  Laterality: N/A;  . Appendectomy    . Excision/release bursa hip Right 10/21/2014    Procedure: OPEN RIGHT HIP BURSECTOMY, EXOSECTOMY;  Surgeon: Paralee Cancel, MD;  Location: [facility];  Service: Orthopedics;  Laterality: Right;    Family History  Problem Relation Age of Onset  . Cancer Mother     breast  . Cancer Father     prostate  . Diabetes Other     grandparent  . Heart disease Other     parent  . Kidney disease Other     parent  . Cancer Other     parent  . Hypertension Other   . Hyperlipidemia Other     Allergies  Allergen Reactions  . Bee Venom   . Codeine Sulfate Other (See Comments)    GI  upset  . Penicillins Rash    Current Outpatient Prescriptions on File Prior to Visit  Medication Sig Dispense Refill  . acetaminophen (TYLENOL) 500 MG tablet Take 1,000 mg by mouth every 6 (six) hours as needed for pain.     Marland Kitchen aspirin EC 325 MG EC tablet Take 1 tablet (325 mg total) by mouth daily. 30 tablet 0  . B Complex-C (B-COMPLEX WITH VITAMIN C) tablet Take 1 tablet by mouth daily.    . Biotin 1000 MCG tablet Take 3,000 mcg by mouth daily.    . calcium-vitamin D (OSCAL WITH D) 500-200 MG-UNIT per tablet Take 2 tablets by mouth 2 (two) times daily.    Marland Kitchen docusate sodium (COLACE) 100 MG capsule Take 1 capsule (100 mg total) by mouth 2 (two) times daily. 10 capsule 0  . EPINEPHrine (EPIPEN 2-PAK) 0.3 mg/0.3 mL IJ SOAJ injection Inject 0.3 mLs (0.3 mg total) into the muscle once. 1 Device 0  . estradiol (ESTRACE) 1 MG tablet Take 1 tablet (1 mg total) by mouth daily. 30 tablet 5  . etanercept (ENBREL) 50 MG/ML injection Inject 0.98 mLs (50 mg total) into the skin once a week. Resume end of next week (Patient taking differently: Inject 50 mg into the skin once a week. Friday) 0.98 mL 0  . ferrous sulfate 325 (65 FE) MG tablet Take 1 tablet (325 mg total) by mouth 2 (two) times daily with a meal.  3  . folic acid (FOLVITE) 1 MG tablet Take 2 mg by mouth daily.     . hydroxychloroquine (PLAQUENIL) 200 MG tablet Take 200 mg by mouth 2 (two) times daily.    . methocarbamol (ROBAXIN) 500 MG tablet Take 1 tablet (500 mg total) by mouth every 6 (six) hours as needed for muscle spasms. 50 tablet 0  . methotrexate (50 MG/ML) 1 gm SOLR May resume previous dose at  end of next week (Patient taking differently: Inject 50 mg into the skin once a week. Friday)    . ondansetron (ZOFRAN ODT) 8 MG disintegrating tablet Take 1 tablet (8 mg total) by mouth every 8 (eight) hours as needed for nausea or vomiting. 15 tablet 0  . oxyCODONE (OXY IR/ROXICODONE) 5 MG immediate release tablet Take 1-2 tablets (5-10 mg  total) by mouth every 4 (four) hours as needed for severe pain. 100 tablet 0  . Pediatric Multiple Vit-C-FA (FLINSTONES GUMMIES OMEGA-3 DHA  PO) Take 4 tablets by mouth daily.    . polyethylene glycol (MIRALAX / GLYCOLAX) packet Take 17 g by mouth daily as needed. 14 each 0  . pramipexole (MIRAPEX) 0.125 MG tablet TAKE 3 TABLETS AT BEDTIME 360 tablet 2  . predniSONE (DELTASONE) 5 MG tablet Take 5 mg by mouth daily with breakfast.    . scopolamine (TRANSDERM-SCOP) 1 MG/3DAYS Place 1 patch (1.5 mg total) onto the skin every 3 (three) days. Behind ear 10 patch 0  . sertraline (ZOLOFT) 50 MG tablet TAKE 1 TABLET DAILY 90 tablet 2  . zolpidem (AMBIEN) 10 MG tablet Take 1 tablet (10 mg total) by mouth at bedtime as needed for sleep. 90 tablet 3   No current facility-administered medications on file prior to visit.    BP 110/68 mmHg  Temp(Src) 98 F (36.7 C) (Oral)  Ht 5\' 4"  (1.626 m)  Wt 198 lb 6.4 oz (89.994 kg)  BMI 34.04 kg/m2       Objective:   Physical Exam  Constitutional: She is oriented to person, place, and time. She appears well-developed and well-nourished. No distress.  Cardiovascular: Normal rate, regular rhythm, normal heart sounds and intact distal pulses.  Exam reveals no gallop and no friction rub.   No murmur heard. Pulmonary/Chest: Effort normal and breath sounds normal. No respiratory distress. She has no wheezes. She has no rales. She exhibits no tenderness.  Musculoskeletal: Normal range of motion. She exhibits tenderness (to right lower back and right leg). She exhibits no edema.  Neurological: She is alert and oriented to person, place, and time.  Skin: Skin is warm and dry. No rash noted. She is not diaphoretic. No erythema. No pallor.  Psychiatric: She has a normal mood and affect. Her behavior is normal. Judgment and thought content normal.  Vitals reviewed.      Assessment & Plan:  1. Sciatica of right side - cyclobenzaprine (FLEXERIL) 10 MG tablet; Take 1  tablet (10 mg total) by mouth 3 (three) times daily as needed for muscle spasms.  Dispense: 30 tablet; Refill: 0 - predniSONE (DELTASONE) 10 MG tablet; Take 1 tablet (10 mg total) by mouth daily with breakfast.  Dispense: 18 tablet; Refill: 0 60 mg x 3 days, 40 mg x 3 days, 20 mg x 3 days.  - ketorolac (TORADOL) injection 60 mg; Inject 2 mLs (60 mg total) into the muscle once.

## 2014-12-06 NOTE — Progress Notes (Signed)
Pre visit review using our clinic review tool, if applicable. No additional management support is needed unless otherwise documented below in the visit note. 

## 2014-12-23 ENCOUNTER — Encounter: Payer: Self-pay | Admitting: Family Medicine

## 2014-12-24 ENCOUNTER — Telehealth: Payer: Self-pay | Admitting: Family Medicine

## 2014-12-24 MED ORDER — LIDOCAINE HCL 3 % EX CREA: 1.0000 "application " | TOPICAL_CREAM | CUTANEOUS | Status: DC | PRN

## 2014-12-24 MED ORDER — HYDROCORTISONE 2.5 % RE CREA
1.0000 | TOPICAL_CREAM | Freq: Two times a day (BID) | RECTAL | Status: DC
Start: 2014-12-24 — End: 2015-09-01

## 2014-12-24 NOTE — Telephone Encounter (Signed)
I sent in a rx for Anusol HC rectal cream

## 2014-12-24 NOTE — Telephone Encounter (Signed)
I left a voice message for pt with below information.  

## 2014-12-24 NOTE — Telephone Encounter (Signed)
Pharmacy called, lidocaine cream is around $ 64 a tube, pt said it cost too much. Can we recommend something more affordable to try? Please send to [facility].

## 2014-12-25 ENCOUNTER — Other Ambulatory Visit: Payer: Self-pay | Admitting: Family Medicine

## 2014-12-25 MED ORDER — SCOPOLAMINE 1 MG/3DAYS TD PT72: 1.0000 | MEDICATED_PATCH | TRANSDERMAL | Status: DC

## 2014-12-25 MED ORDER — ESTRADIOL 1 MG PO TABS: 1.0000 mg | ORAL_TABLET | Freq: Every day | ORAL | Status: DC

## 2015-01-10 ENCOUNTER — Encounter: Payer: Self-pay | Admitting: Family Medicine

## 2015-01-10 MED ORDER — ESTRADIOL 1 MG PO TABS: 1.0000 mg | ORAL_TABLET | Freq: Every day | ORAL | Status: DC

## 2015-02-19 ENCOUNTER — Encounter: Payer: Self-pay | Admitting: Family Medicine

## 2015-02-19 ENCOUNTER — Ambulatory Visit (INDEPENDENT_AMBULATORY_CARE_PROVIDER_SITE_OTHER): Payer: BLUE CROSS/BLUE SHIELD | Admitting: Family Medicine

## 2015-02-19 VITALS — BP 110/64 | HR 115 | Temp 97.8°F | Resp 16 | Ht 64.0 in | Wt 194.6 lb

## 2015-02-19 DIAGNOSIS — G44209 Tension-type headache, unspecified, not intractable: Secondary | ICD-10-CM

## 2015-02-19 DIAGNOSIS — M069 Rheumatoid arthritis, unspecified: Secondary | ICD-10-CM | POA: Diagnosis not present

## 2015-02-19 DIAGNOSIS — M542 Cervicalgia: Secondary | ICD-10-CM | POA: Diagnosis not present

## 2015-02-19 NOTE — Progress Notes (Signed)
Subjective:    Patient ID: [name], female    DOB: 06/05/1955, 59 y.o.   MRN: [id number]  HPI Patient seen with chronic cervical neck pain for several months. She has underlying rheumatoid arthritis followed by rheumatology. She's had some recent issues with lumbar back pain and reportedly just last week had MRI lumbar spine per orthopedist and has not heard back yet.  Her neck pain is lower cervical region and radiates into the occipital part of the skull. She has occasional pains radiating down the right upper extremity. No weakness. Occasional numbness involving the index and middle finger right hand. Neck pain exacerbated by flexion, extension, or lateral bending to either side.  Very poor sleep quality. Using Flexeril and Ambien at night -still not sleeping well. Denies any recent neck x-rays. No acute injury. Pain is moderate.  She is on chronic low-dose prednisone 5 mg and Enbrel for her rheumatoid arthritis.  She also takes plaquenil.  Past Medical History  Diagnosis Date  . ANGIOMA 10/03/2008    REMOVED FROM RIGHT LOWER LEG-BENIGN  . HYPERLIPIDEMIA 08/27/2008  . GERD 08/27/2008  . SEBORRHEIC KERATOSIS 08/27/2008  . Arthritis     RA  . Fibromyalgia   . Complication of anesthesia   . PONV (postoperative nausea and vomiting)   . History of nonmelanoma skin cancer   . Cancer (HCC)     BASAL CELL SKIN CANCER  . HYPERTENSION 08/27/2008    pt is currently on no meds   . Pneumonia     hx of   . Bronchitis     hx of   . Anxiety   . History of hiatal hernia     hx of has had surgically repaired  . History of blood transfusion     1995  . History of measles   . History of mumps    Past Surgical History  Procedure Laterality Date  . Cesarean section  1980    times 2; 1980 and 1982  . Cesarean section  1982  . Cholecystectomy  1997  . Abdominal hysterectomy  1995  . Foot surgery  2004, 2005, 2008    bilat secondary to neuropathy   . Knee arthroscopy  2007, 2008,  2009  . Nasal sinus surgery  2013  . Breast ductal system excision  08/16/2011    Procedure: EXCISION DUCTAL SYSTEM BREAST;  Surgeon: Joyice Faster. Cornett, MD;  Location: [place];  Service: General;  Laterality: Left;  left breast duct excision  . Open surgical repair of gluteal tendon  01/17/2012    Procedure: OPEN SURGICAL REPAIR OF GLUTEAL TENDON;  Surgeon: Mauri Pole, MD;  Location: [facility];  Service: Orthopedics;  Laterality: Right;  . Joint replacement  10,12    lt total knee X2  . Laparoscopic gastric sleeve resection N/A 10/30/2013    Procedure: LAPAROSCOPIC GASTRIC SLEEVE RESECTION WITH HIATAL HERNIA REPAIR AND UPPER ENDOSCOPY;  Surgeon: Greer Pickerel, MD;  Location: [facility];  Service: General;  Laterality: N/A;  . Appendectomy    . Excision/release bursa hip Right 10/21/2014    Procedure: OPEN RIGHT HIP BURSECTOMY, EXOSECTOMY;  Surgeon: Paralee Cancel, MD;  Location: [facility];  Service: Orthopedics;  Laterality: Right;    reports that she quit smoking about 31 years ago. Her smoking use included Cigarettes. She has a 1 pack-year smoking history. She has never used smokeless tobacco. She reports that she does not drink alcohol or use illicit drugs. family history includes Cancer  in her father, mother, and other; Diabetes in her other; Heart disease in her other; Hyperlipidemia in her other; Hypertension in her other; Kidney disease in her other. Allergies  Allergen Reactions  . Bee Venom   . Codeine Sulfate Other (See Comments)    GI upset  . Penicillins Rash      Review of Systems  Constitutional: Negative for fever, chills, appetite change and unexpected weight change.  HENT: Negative for sore throat.   Respiratory: Negative for shortness of breath.   Cardiovascular: Negative for chest pain.  Musculoskeletal: Positive for back pain and arthralgias.  Neurological: Positive for numbness and headaches. Negative for dizziness, seizures, speech difficulty and weakness.    Hematological: Negative for adenopathy.       Objective:   Physical Exam  Constitutional: She is oriented to person, place, and time. She appears well-developed and well-nourished.  Neck: Neck supple. No thyromegaly present.  Cardiovascular: Normal rate and regular rhythm.   Pulmonary/Chest: Effort normal and breath sounds normal. No respiratory distress. She has no wheezes. She has no rales.  Musculoskeletal: She exhibits no edema.  Lower cervical neck pain with flexion, extension, and lateral bending to the right or left.  Neurological: She is alert and oriented to person, place, and time. No cranial nerve deficit.  Full strength upper extremities. Symmetric reflexes. Normal sensory function to touch  Psychiatric: She has a normal mood and affect. Her behavior is normal.          Assessment & Plan:  Somewhat chronic cervical neck pain with probable tension type headache. Avoid daily use of Tylenol to avoid analgesic withdrawal headache. Continue Flexeril. Given duration of symptoms obtain cervical spine films.  Try topical moist heat for muscle tension. Continue close follow-up with rheumatology regarding her rheumatoid arthritis

## 2015-02-19 NOTE — Patient Instructions (Signed)
Laryngitis Laryngitis is inflammation of your vocal cords. This causes hoarseness, coughing, loss of voice, sore throat, or a dry throat. Your vocal cords are two bands of muscles that are found in your throat. When you speak, these cords come together and vibrate. These vibrations come out through your mouth as sound. When your vocal cords are inflamed, your voice sounds different. Laryngitis can be temporary (acute) or long-term (chronic). Most cases of acute laryngitis improve with time. Chronic laryngitis is laryngitis that lasts for more than three weeks. CAUSES Acute laryngitis may be caused by:  A viral infection.  Lots of talking, yelling, or singing. This is also called vocal strain.  Bacterial infections. Chronic laryngitis may be caused by:  Vocal strain.  Injury to your vocal cords.  Acid reflux (gastroesophageal reflux disease or GERD).  Allergies.  Sinus infection.  Smoking.  Alcohol abuse.  Breathing in chemicals or dust.  Growths on the vocal cords. RISK FACTORS Risk factors for laryngitis include:  Smoking.  Alcohol abuse.  Having allergies. SIGNS AND SYMPTOMS Symptoms of laryngitis may include:  Low, hoarse voice.  Loss of voice.  Dry cough.  Sore throat.  Stuffy nose. DIAGNOSIS Laryngitis may be diagnosed by:  Physical exam.  Throat culture.  Blood test.  Laryngoscopy. This procedure allows your health care provider to look at your vocal cords with a mirror or viewing tube. TREATMENT Treatment for laryngitis depends on what is causing it. Usually, treatment involves resting your voice and using medicines to soothe your throat. However, if your laryngitis is caused by a bacterial infection, you may need to take antibiotic medicine. If your laryngitis is caused by a growth, you may need to have a procedure to remove it. HOME CARE INSTRUCTIONS  Drink enough fluid to keep your urine clear or pale yellow.  Breathe in moist air. Use a  humidifier if you live in a dry climate.  Take medicines only as directed by your health care provider.  If you were prescribed an antibiotic medicine, finish it all even if you start to feel better.  Do not smoke cigarettes or electronic cigarettes. If you need help quitting, ask your health care provider.  Talk as little as possible. Also avoid whispering, which can cause vocal strain.  Write instead of talking. Do this until your voice is back to normal. SEEK MEDICAL CARE IF:  You have a fever.  You have increasing pain.  You have difficulty swallowing. SEEK IMMEDIATE MEDICAL CARE IF:  You cough up blood.  You have trouble breathing.   This information is not intended to replace advice given to you by your health care provider. Make sure you discuss any questions you have with your health care provider.   Document Released: 02/08/2005 Document Revised: 03/01/2014 Document Reviewed: 07/24/2013 Elsevier Interactive Patient Education 2016 [employer]  Try OTC Aleve 1-2 every 12 hours or Ibuprofen 200 mg 2 to 3 every 8 hours as needed.

## 2015-02-19 NOTE — Progress Notes (Signed)
Pre visit review using our clinic review tool, if applicable. No additional management support is needed unless otherwise documented below in the visit note. 

## 2015-02-21 ENCOUNTER — Telehealth: Payer: Self-pay

## 2015-02-21 ENCOUNTER — Ambulatory Visit (INDEPENDENT_AMBULATORY_CARE_PROVIDER_SITE_OTHER)
Admission: RE | Admit: 2015-02-21 | Discharge: 2015-02-21 | Disposition: A | Discharge status: Home / Self Care | Payer: BLUE CROSS/BLUE SHIELD | Source: Ambulatory Visit | Attending: Family Medicine | Admitting: Family Medicine

## 2015-02-21 DIAGNOSIS — M542 Cervicalgia: Secondary | ICD-10-CM

## 2015-02-21 NOTE — Telephone Encounter (Signed)
Pt request zolpidem (AMBIEN) 10 MG tablet Pt last visit 02/19/15 Last Rx refill 08/29/14 #90 with 3 refills

## 2015-02-21 NOTE — Telephone Encounter (Signed)
Refills OK. 

## 2015-02-25 ENCOUNTER — Other Ambulatory Visit: Payer: Self-pay | Admitting: Family Medicine

## 2015-02-25 DIAGNOSIS — G44221 Chronic tension-type headache, intractable: Secondary | ICD-10-CM

## 2015-02-25 DIAGNOSIS — G8929 Other chronic pain: Secondary | ICD-10-CM

## 2015-02-25 DIAGNOSIS — M542 Cervicalgia: Principal | ICD-10-CM

## 2015-02-25 MED ORDER — ZOLPIDEM TARTRATE 10 MG PO TABS: 10.0000 mg | ORAL_TABLET | Freq: Every evening | ORAL | Status: DC | PRN

## 2015-02-25 NOTE — Telephone Encounter (Signed)
Rx was fax to the pharmacy

## 2015-03-03 ENCOUNTER — Ambulatory Visit (INDEPENDENT_AMBULATORY_CARE_PROVIDER_SITE_OTHER): Payer: BLUE CROSS/BLUE SHIELD | Admitting: Family Medicine

## 2015-03-03 ENCOUNTER — Ambulatory Visit: Payer: Medicare Other | Admitting: Physical Therapy

## 2015-03-03 ENCOUNTER — Encounter: Payer: Self-pay | Admitting: Family Medicine

## 2015-03-03 VITALS — BP 120/84 | HR 92 | Temp 98.5°F | Resp 16 | Ht 64.0 in | Wt 196.0 lb

## 2015-03-03 DIAGNOSIS — F411 Generalized anxiety disorder: Secondary | ICD-10-CM | POA: Diagnosis not present

## 2015-03-03 DIAGNOSIS — I10 Essential (primary) hypertension: Secondary | ICD-10-CM

## 2015-03-03 DIAGNOSIS — M5431 Sciatica, right side: Secondary | ICD-10-CM | POA: Diagnosis not present

## 2015-03-03 MED ORDER — CYCLOBENZAPRINE HCL 10 MG PO TABS: 10.0000 mg | ORAL_TABLET | Freq: Three times a day (TID) | ORAL | Status: DC | PRN

## 2015-03-03 MED ORDER — SERTRALINE HCL 50 MG PO TABS: ORAL_TABLET | ORAL | Status: DC

## 2015-03-03 NOTE — Progress Notes (Signed)
Pre visit review using our clinic review tool, if applicable. No additional management support is needed unless otherwise documented below in the visit note. 

## 2015-03-03 NOTE — Patient Instructions (Signed)
Monitor blood pressure and be in touch if consistently > 150/90 

## 2015-03-03 NOTE — Progress Notes (Signed)
Subjective:    Patient ID: [name], female    DOB: 1955/09/12, 60 y.o.   MRN: [id number]  HPI Patient here for routine medical follow-up History of hypertension. She had gastric bypass surgery and has lost tremendous amount of weight and blood pressures have improved. We took her off lisinopril last summer. Blood pressures been stable since then. No headaches or dizziness.  Has had some recent cervical neck pain as per previous visit. She has been referred for physical therapy. X-ray showed only mild degenerative changes. No acute findings. No active radiculopathy symptoms.  Past Medical History  Diagnosis Date  . ANGIOMA 10/03/2008    REMOVED FROM RIGHT LOWER LEG-BENIGN  . HYPERLIPIDEMIA 08/27/2008  . GERD 08/27/2008  . SEBORRHEIC KERATOSIS 08/27/2008  . Arthritis     RA  . Fibromyalgia   . Complication of anesthesia   . PONV (postoperative nausea and vomiting)   . History of nonmelanoma skin cancer   . Cancer (HCC)     BASAL CELL SKIN CANCER  . HYPERTENSION 08/27/2008    pt is currently on no meds   . Pneumonia     hx of   . Bronchitis     hx of   . Anxiety   . History of hiatal hernia     hx of has had surgically repaired  . History of blood transfusion     1995  . History of measles   . History of mumps    Past Surgical History  Procedure Laterality Date  . Cesarean section  1980    times 2; 1980 and 1982  . Cesarean section  1982  . Cholecystectomy  1997  . Abdominal hysterectomy  1995  . Foot surgery  2004, 2005, 2008    bilat secondary to neuropathy   . Knee arthroscopy  2007, 2008, 2009  . Nasal sinus surgery  2013  . Breast ductal system excision  08/16/2011    Procedure: EXCISION DUCTAL SYSTEM BREAST;  Surgeon: Joyice Faster. Cornett, MD;  Location: [place];  Service: General;  Laterality: Left;  left breast duct excision  . Open surgical repair of gluteal tendon  01/17/2012    Procedure: OPEN SURGICAL REPAIR OF GLUTEAL TENDON;  Surgeon:  Mauri Pole, MD;  Location: [facility];  Service: Orthopedics;  Laterality: Right;  . Joint replacement  10,12    lt total knee X2  . Laparoscopic gastric sleeve resection N/A 10/30/2013    Procedure: LAPAROSCOPIC GASTRIC SLEEVE RESECTION WITH HIATAL HERNIA REPAIR AND UPPER ENDOSCOPY;  Surgeon: Greer Pickerel, MD;  Location: [facility];  Service: General;  Laterality: N/A;  . Appendectomy    . Excision/release bursa hip Right 10/21/2014    Procedure: OPEN RIGHT HIP BURSECTOMY, EXOSECTOMY;  Surgeon: Paralee Cancel, MD;  Location: [facility];  Service: Orthopedics;  Laterality: Right;    reports that she quit smoking about 31 years ago. Her smoking use included Cigarettes. She has a 1 pack-year smoking history. She has never used smokeless tobacco. She reports that she does not drink alcohol or use illicit drugs. family history includes Cancer in her father, mother, and other; Diabetes in her other; Heart disease in her other; Hyperlipidemia in her other; Hypertension in her other; Kidney disease in her other. Allergies  Allergen Reactions  . Bee Venom   . Codeine Sulfate Other (See Comments)    GI upset  . Penicillins Rash      Review of Systems  Constitutional: Negative for fatigue.  Eyes: Negative for visual disturbance.  Respiratory: Negative for cough, chest tightness, shortness of breath and wheezing.   Cardiovascular: Negative for chest pain, palpitations and leg swelling.  Musculoskeletal: Positive for neck pain.  Neurological: Negative for dizziness, seizures, syncope, weakness, light-headedness and headaches.       Objective:   Physical Exam  Constitutional: She appears well-developed and well-nourished.  Cardiovascular: Normal rate and regular rhythm.  Exam reveals no gallop.   Pulmonary/Chest: Effort normal and breath sounds normal. No respiratory distress. She has no wheezes. She has no rales.  Musculoskeletal: She exhibits no edema.          Assessment & Plan:  #1 hypertension.  Improved with weight loss. She did have repeat blood pressure left arm seated when checked by me 138/86. Continue close monitoring. She will continue her weight loss efforts  #2 anxiety. Patient currently on sertraline 50 mg once daily. She requests titration. Will increase to 75 mg daily. Touch base in one month if not helping

## 2015-03-06 MED ORDER — ZOLPIDEM TARTRATE 10 MG PO TABS: 10.0000 mg | ORAL_TABLET | Freq: Every evening | ORAL | Status: DC | PRN

## 2015-03-06 NOTE — Telephone Encounter (Signed)
Pt's pharmacy switched from [facility] to [facility].

## 2015-03-06 NOTE — Telephone Encounter (Signed)
Received a fax from [facility] needing rx sent in.  Rx re-printed for #90 with 1 rf.

## 2015-03-06 NOTE — Addendum Note (Signed)
Addended by: Colleen Can on: 03/06/2015 10:51 AM   Modules accepted: Orders

## 2015-03-10 ENCOUNTER — Ambulatory Visit: Payer: BLUE CROSS/BLUE SHIELD | Attending: Family Medicine | Admitting: Physical Therapy

## 2015-03-10 DIAGNOSIS — G44221 Chronic tension-type headache, intractable: Secondary | ICD-10-CM

## 2015-03-10 DIAGNOSIS — M542 Cervicalgia: Secondary | ICD-10-CM | POA: Diagnosis not present

## 2015-03-10 NOTE — Therapy (Signed)
[facility] [place], [place], [address] Phone: [phone]   Fax:  [phone]  Physical Therapy Evaluation  Patient Details  Name: [name] MRN: [id number] Date of Birth: 1955/04/24 Referring Provider: Carolann Littler MD.  Encounter Date: 03/10/2015      PT End of Session - 03/10/15 1206    PT Start Time 1038   PT Stop Time 1125   PT Time Calculation (min) 47 min      Past Medical History  Diagnosis Date  . ANGIOMA 10/03/2008    REMOVED FROM RIGHT LOWER LEG-BENIGN  . HYPERLIPIDEMIA 08/27/2008  . GERD 08/27/2008  . SEBORRHEIC KERATOSIS 08/27/2008  . Arthritis     RA  . Fibromyalgia   . Complication of anesthesia   . PONV (postoperative nausea and vomiting)   . History of nonmelanoma skin cancer   . Cancer (HCC)     BASAL CELL SKIN CANCER  . HYPERTENSION 08/27/2008    pt is currently on no meds   . Pneumonia     hx of   . Bronchitis     hx of   . Anxiety   . History of hiatal hernia     hx of has had surgically repaired  . History of blood transfusion     1995  . History of measles   . History of mumps     Past Surgical History  Procedure Laterality Date  . Cesarean section  1980    times 2; 1980 and 1982  . Cesarean section  1982  . Cholecystectomy  1997  . Abdominal hysterectomy  1995  . Foot surgery  2004, 2005, 2008    bilat secondary to neuropathy   . Knee arthroscopy  2007, 2008, 2009  . Nasal sinus surgery  2013  . Breast ductal system excision  08/16/2011    Procedure: EXCISION DUCTAL SYSTEM BREAST;  Surgeon: Joyice Faster. Cornett, MD;  Location: [facility];  Service: General;  Laterality: Left;  left breast duct excision  . Open surgical repair of gluteal tendon  01/17/2012    Procedure: OPEN SURGICAL REPAIR OF GLUTEAL TENDON;  Surgeon: Mauri Pole, MD;  [facility]: [facility];  Service: Orthopedics;  Laterality: Right;  . Joint replacement  10,12    lt total knee X2  .  Laparoscopic gastric sleeve resection N/A 10/30/2013    Procedure: LAPAROSCOPIC GASTRIC SLEEVE RESECTION WITH HIATAL HERNIA REPAIR AND UPPER ENDOSCOPY;  Surgeon: Greer Pickerel, MD;  [facility];  Service: General;  Laterality: N/A;  . Appendectomy    . Excision/release bursa hip Right 10/21/2014    Procedure: OPEN RIGHT HIP BURSECTOMY, EXOSECTOMY;  Surgeon: Paralee Cancel, MD;  [facility];  Service: Orthopedics;  Laterality: Right;    There were no vitals filed for this visit.  Visit Diagnosis:  Neck pain - Plan: PT plan of care cert/re-cert  Chronic tension-type headache, intractable - Plan: PT plan of care cert/re-cert      Subjective Assessment - 03/10/15 1809    Subjective Want to get rid of these headaches.   Limitations Sitting   How long can you sit comfortably? 15 minutes.   Patient Stated Goals Get rid of headaches.   Currently in Pain? Yes   Pain Score 6    Pain Location Head   Pain Orientation Right   Pain Descriptors / Indicators Aching;Throbbing   Pain Onset More than a month ago   Aggravating Factors  Sitting at computer.  Pain Relieving Factors Ice.            [facility] PT Assessment - 03/10/15 0001    Assessment   Medical Diagnosis Chronic tension-type headaches.   Referring Provider Carolann Littler MD.   Onset Date/Surgical Date --  4 months.   Precautions   Precautions --  RA.   Restrictions   Weight Bearing Restrictions No   Balance Screen   Has the patient fallen in the past 6 months No   Has the patient had a decrease in activity level because of a fear of falling?  No   Is the patient reluctant to leave their home because of a fear of falling?  No   Home Environment   Living Environment Private residence   Prior Function   Level of Independence Independent   Posture/Postural Control   Posture/Postural Control Postural limitations   Postural Limitations Rounded Shoulders;Forward head   ROM / Strength   AROM / PROM / Strength AROM;Strength    AROM   Overall AROM Comments Right cervical rotation= 50 degrees and left= 40 degrees and bilateral SB= 15 minutes.   Strength   Overall Strength Comments Normal UE strength bilaterally.   Palpation   Palpation comment Tender to palpation over right suboccipital region especially at C2.   Special Tests    Special Tests --  1+ to 2+/4+ bil UE DTR's.                   [facility] Adult PT Treatment/Exercise - 03/10/15 0001    Manual Therapy   Manual therapy comments In supine STW/M and suboccipital release technique x 23 minutes.                  PT Short Term Goals - 03/10/15 1815    PT SHORT TERM GOAL #1   Title Ind with a HEP.   Time 2   Period Weeks   Status New   PT SHORT TERM GOAL #2   Title Bilateral active cervical rotation= 60 degrees.   Time 2   Period Weeks   Status New           PT Long Term Goals - 03/10/15 1815    PT LONG TERM GOAL #1   Title Increase active cervical rotation to 70 degrees+ so patient can turn head more easily while driving.   Time 6   Period Weeks   Status New   PT LONG TERM GOAL #2   Title Eliminate headaches.   Time 6   Period Weeks   Status New   PT LONG TERM GOAL #3   Title Sit 30 minutes with pain not > 3/10.   Time 6   Period Weeks   Status New   PT LONG TERM GOAL #4   Title Sleep 6 hours undisturbed.   Time 6   Period Weeks   Status New               Plan - 03/10/15 1812    Clinical Impression Statement The patient has had ongoing and worseneing headaches over the last 4 months.  They are right sided and daily.  She cannot sleep comfortably.   Pt will benefit from skilled therapeutic intervention in order to improve on the following deficits Pain;Decreased range of motion;Decreased activity tolerance   Rehab Potential Good   PT Frequency 2x / week   PT Duration 6 weeks   PT Treatment/Interventions ADLs/Self Care Home Management;Electrical Stimulation;Therapeutic exercise;Ultrasound;Manual  techniques  PT Next Visit Plan in supine upper cervical STW; gentle manual traction.  instruct in chin tucks and cervical extension with 5 sec hold to be performed throughout the day and active bilateral cervical rotation.   Consulted and Agree with Plan of Care Patient          G-Codes - 2015/03/17 1818    Functional Assessment Tool Used FOTO.Marland Kitchen..54%.   Functional Limitation Other PT primary   Other PT Primary Current Status [id number]) At least 40 percent but less than 60 percent impaired, limited or restricted   Other PT Primary Goal Status [id number]) At least 1 percent but less than 20 percent impaired, limited or restricted       Problem List Patient Active Problem List   Diagnosis Date Noted  . Anxiety state 03/03/2015  . Hip bursitis 10/22/2014  . Osteophyte of hip 10/21/2014  . Epigastric cramping 11/09/2013  . Nausea with vomiting 11/06/2013  . Rheumatoid arthritis ([place]) 11/01/2013  . S/P laparoscopic sleeve gastrectomy 10/30/2013  . Anemia 06/04/2013  . CAP (community acquired pneumonia) 02/17/2013  . Restless leg syndrome 09/18/2012  . Morbid obesity with BMI of 45.0-49.9, adult ([place]) 01/18/2012  . Right gluteus tear 01/17/2012  . Nipple discharge in female 07/08/2011  . Chronic insomnia 06/18/2011  . Discharge from nipple 01/06/2011  . TMJ PAIN 01/12/2010  . DEPRESSION 12/10/2009  . SINUSITIS, ACUTE 12/30/2008  . Lumbago 12/30/2008  . EDEMA 10/03/2008  . Hyperlipidemia 08/27/2008  . Essential hypertension 08/27/2008  . GERD 08/27/2008    Salah Burlison, [place] MPT 17-Mar-2015, 6:28 PM  [facility] [address] [place], [place], [address] Phone: [phone]   Fax:  [phone]  Name: [name] MRN: [id number] Date of Birth: [birth year]

## 2015-03-11 ENCOUNTER — Ambulatory Visit: Payer: BLUE CROSS/BLUE SHIELD | Admitting: Physical Therapy

## 2015-03-11 DIAGNOSIS — G44221 Chronic tension-type headache, intractable: Secondary | ICD-10-CM

## 2015-03-11 DIAGNOSIS — M542 Cervicalgia: Secondary | ICD-10-CM

## 2015-03-11 NOTE — Therapy (Signed)
[facility] [place], [place], [address] Phone: [phone]   Fax:  [phone]  Physical Therapy Treatment  Patient Details  Name: [name] MRN: [id number] Date of Birth: 20-Oct-1955 Referring Provider: Carolann Littler MD.  Encounter Date: 03/11/2015      PT End of Session - 03/11/15 1751    Visit Number 1   Number of Visits 12   Date for PT Re-Evaluation 04/22/15   PT Start Time 0400   PT Stop Time 0459   PT Time Calculation (min) 59 min   Activity Tolerance Patient tolerated treatment well   Behavior During Therapy [facility] for tasks assessed/performed      Past Medical History  Diagnosis Date  . ANGIOMA 10/03/2008    REMOVED FROM RIGHT LOWER LEG-BENIGN  . HYPERLIPIDEMIA 08/27/2008  . GERD 08/27/2008  . SEBORRHEIC KERATOSIS 08/27/2008  . Arthritis     RA  . Fibromyalgia   . Complication of anesthesia   . PONV (postoperative nausea and vomiting)   . History of nonmelanoma skin cancer   . Cancer (HCC)     BASAL CELL SKIN CANCER  . HYPERTENSION 08/27/2008    pt is currently on no meds   . Pneumonia     hx of   . Bronchitis     hx of   . Anxiety   . History of hiatal hernia     hx of has had surgically repaired  . History of blood transfusion     1995  . History of measles   . History of mumps     Past Surgical History  Procedure Laterality Date  . Cesarean section  1980    times 2; 1980 and 1982  . Cesarean section  1982  . Cholecystectomy  1997  . Abdominal hysterectomy  1995  . Foot surgery  2004, 2005, 2008    bilat secondary to neuropathy   . Knee arthroscopy  2007, 2008, 2009  . Nasal sinus surgery  2013  . Breast ductal system excision  08/16/2011    Procedure: EXCISION DUCTAL SYSTEM BREAST;  Surgeon: Joyice Faster. Cornett, MD;  Location: [facility];  Service: General;  Laterality: Left;  left breast duct excision  . Open surgical repair of gluteal tendon  01/17/2012    Procedure:  OPEN SURGICAL REPAIR OF GLUTEAL TENDON;  Surgeon: Mauri Pole, MD;  [facility];  Service: Orthopedics;  Laterality: Right;  . Joint replacement  10,12    lt total knee X2  . Laparoscopic gastric sleeve resection N/A 10/30/2013    Procedure: LAPAROSCOPIC GASTRIC SLEEVE RESECTION WITH HIATAL HERNIA REPAIR AND UPPER ENDOSCOPY;  Surgeon: Greer Pickerel, MD;  [facility];  Service: General;  Laterality: N/A;  . Appendectomy    . Excision/release bursa hip Right 10/21/2014    Procedure: OPEN RIGHT HIP BURSECTOMY, EXOSECTOMY;  Surgeon: Paralee Cancel, MD;  [facility];  Service: Orthopedics;  Laterality: Right;    There were no vitals filed for this visit.  Visit Diagnosis:  Neck pain  Chronic tension-type headache, intractable      Subjective Assessment - 03/11/15 1754    Subjective My pain is a 6/10 today.   Pain Score 6    Pain Location Head   Pain Orientation Right   Pain Descriptors / Indicators Aching;Throbbing                         [facility] Adult PT  Treatment/Exercise - 03/11/15 0001    Modalities   Modalities Electrical Stimulation;Moist Heat   Moist Heat Therapy   Number Minutes Moist Heat 15 Minutes   Moist Heat Location Cervical   Electrical Stimulation   Electrical Stimulation Location Bil upper cervical region.   Electrical Stimulation Action Pre-mod e'stim   Electrical Stimulation Parameters 80-150 HZ x 15 minutes at a low output (5 sec on and 5 sec off)   Manual Therapy   Manual therapy comments In supine cervical manual traction; STW/M and suboccipital release technique x 38 minutes.                  PT Short Term Goals - 2015-03-26 1815    PT SHORT TERM GOAL #1   Title Ind with a HEP.   Time 2   Period Weeks   Status New   PT SHORT TERM GOAL #2   Title Bilateral active cervical rotation= 60 degrees.   Time 2   Period Weeks   Status New           PT Long Term Goals - 03/26/2015 1815    PT LONG TERM GOAL #1   Title  Increase active cervical rotation to 70 degrees+ so patient can turn head more easily while driving.   Time 6   Period Weeks   Status New   PT LONG TERM GOAL #2   Title Eliminate headaches.   Time 6   Period Weeks   Status New   PT LONG TERM GOAL #3   Title Sit 30 minutes with pain not > 3/10.   Time 6   Period Weeks   Status New   PT LONG TERM GOAL #4   Title Sleep 6 hours undisturbed.   Time 6   Period Weeks   Status New               Plan - 03/26/2015 1812    Clinical Impression Statement The patient has had ongoing and worseneing headaches over the last 4 months.  They are right sided and daily.  She cannot sleep comfortably.   Pt will benefit from skilled therapeutic intervention in order to improve on the following deficits Pain;Decreased range of motion;Decreased activity tolerance   Rehab Potential Good   PT Frequency 2x / week   PT Duration 6 weeks   PT Treatment/Interventions ADLs/Self Care Home Management;Electrical Stimulation;Therapeutic exercise;Ultrasound;Manual techniques   PT Next Visit Plan in supine upper cervical STW; gentle manual traction.  instruct in chin tucks and cervical extension with 5 sec hold to be performed throughout the day and active bilateral cervical rotation.   Consulted and Agree with Plan of Care Patient          G-Codes - 03/26/2015 1818    Functional Assessment Tool Used FOTO.Marland Kitchen..54%.   Functional Limitation Other PT primary   Other PT Primary Current Status [id number]) At least 40 percent but less than 60 percent impaired, limited or restricted   Other PT Primary Goal Status [id number]) At least 1 percent but less than 20 percent impaired, limited or restricted      Problem List Patient Active Problem List   Diagnosis Date Noted  . Anxiety state 03/03/2015  . Hip bursitis 10/22/2014  . Osteophyte of hip 10/21/2014  . Epigastric cramping 11/09/2013  . Nausea with vomiting 11/06/2013  . Rheumatoid arthritis ([place]) 11/01/2013  .  S/P laparoscopic sleeve gastrectomy 10/30/2013  . Anemia 06/04/2013  . CAP (community acquired pneumonia) 02/17/2013  .  Restless leg syndrome 09/18/2012  . Morbid obesity with BMI of 45.0-49.9, adult ([place]) 01/18/2012  . Right gluteus tear 01/17/2012  . Nipple discharge in female 07/08/2011  . Chronic insomnia 06/18/2011  . Discharge from nipple 01/06/2011  . TMJ PAIN 01/12/2010  . DEPRESSION 12/10/2009  . SINUSITIS, ACUTE 12/30/2008  . Lumbago 12/30/2008  . EDEMA 10/03/2008  . Hyperlipidemia 08/27/2008  . Essential hypertension 08/27/2008  . GERD 08/27/2008    Cove Haydon, [place] MPT 03/11/2015, 5:59 PM  [facility] [address] [place], [place], [address] Phone: [phone]   Fax:  [phone]  Name: [name] MRN: [id number] Date of Birth: 07-04-1955

## 2015-03-14 ENCOUNTER — Other Ambulatory Visit: Payer: Self-pay | Admitting: Family Medicine

## 2015-03-14 ENCOUNTER — Encounter: Payer: Self-pay | Admitting: Family Medicine

## 2015-03-14 MED ORDER — SERTRALINE HCL 50 MG PO TABS: ORAL_TABLET | ORAL | Status: DC

## 2015-03-17 ENCOUNTER — Encounter: Payer: Self-pay | Admitting: Physical Therapy

## 2015-03-17 ENCOUNTER — Ambulatory Visit: Payer: BLUE CROSS/BLUE SHIELD | Admitting: Physical Therapy

## 2015-03-17 DIAGNOSIS — M542 Cervicalgia: Secondary | ICD-10-CM

## 2015-03-17 DIAGNOSIS — G44221 Chronic tension-type headache, intractable: Secondary | ICD-10-CM | POA: Diagnosis not present

## 2015-03-17 NOTE — Therapy (Addendum)
[facility] [place], [place], [address] Phone: [phone]   Fax:  [phone]  Physical Therapy Treatment  Patient Details  Name: [name] MRN: [id number] Date of Birth: August 31, 1955 Referring Provider: Carolann Littler MD.  Encounter Date: 03/17/2015      PT End of Session - 03/17/15 1426    Visit Number 2   Number of Visits 12   Date for PT Re-Evaluation 04/22/15   PT Start Time 1427   PT Stop Time 1524   PT Time Calculation (min) 57 min   Activity Tolerance Patient tolerated treatment well   Behavior During Therapy [facility] for tasks assessed/performed      Past Medical History  Diagnosis Date  . ANGIOMA 10/03/2008    REMOVED FROM RIGHT LOWER LEG-BENIGN  . HYPERLIPIDEMIA 08/27/2008  . GERD 08/27/2008  . SEBORRHEIC KERATOSIS 08/27/2008  . Arthritis     RA  . Fibromyalgia   . Complication of anesthesia   . PONV (postoperative nausea and vomiting)   . History of nonmelanoma skin cancer   . Cancer (HCC)     BASAL CELL SKIN CANCER  . HYPERTENSION 08/27/2008    pt is currently on no meds   . Pneumonia     hx of   . Bronchitis     hx of   . Anxiety   . History of hiatal hernia     hx of has had surgically repaired  . History of blood transfusion     1995  . History of measles   . History of mumps     Past Surgical History  Procedure Laterality Date  . Cesarean section  1980    times 2; 1980 and 1982  . Cesarean section  1982  . Cholecystectomy  1997  . Abdominal hysterectomy  1995  . Foot surgery  2004, 2005, 2008    bilat secondary to neuropathy   . Knee arthroscopy  2007, 2008, 2009  . Nasal sinus surgery  2013  . Breast ductal system excision  08/16/2011    Procedure: EXCISION DUCTAL SYSTEM BREAST;  Surgeon: Joyice Faster. Cornett, MD;  Location: [facility];  Service: General;  Laterality: Left;  left breast duct excision  . Open surgical repair of gluteal tendon  01/17/2012    Procedure:  OPEN SURGICAL REPAIR OF GLUTEAL TENDON;  Surgeon: Mauri Pole, MD;  [facility];  Service: Orthopedics;  Laterality: Right;  . Joint replacement  10,12    lt total knee X2  . Laparoscopic gastric sleeve resection N/A 10/30/2013    Procedure: LAPAROSCOPIC GASTRIC SLEEVE RESECTION WITH HIATAL HERNIA REPAIR AND UPPER ENDOSCOPY;  Surgeon: Greer Pickerel, MD;  [facility];  Service: General;  Laterality: N/A;  . Appendectomy    . Excision/release bursa hip Right 10/21/2014    Procedure: OPEN RIGHT HIP BURSECTOMY, EXOSECTOMY;  Surgeon: Paralee Cancel, MD;  [facility];  Service: Orthopedics;  Laterality: Right;    There were no vitals filed for this visit.  Visit Diagnosis:  Neck pain  Chronic tension-type headache, intractable      Subjective Assessment - 03/17/15 1425    Subjective Reports that she has headache today that reaches a 7/10.   Limitations Sitting   How long can you sit comfortably? 15 minutes.   Patient Stated Goals Get rid of headaches.   Currently in Pain? Yes   Pain Score 7    Pain Location Head   Pain Orientation Right  Pain Descriptors / Indicators Dull;Aching;Throbbing   Pain Onset More than a month ago   Pain Frequency Constant            [facility] PT Assessment - 03/17/15 0001    Assessment   Medical Diagnosis Chronic tension-type headaches.                     [facility] Adult PT Treatment/Exercise - 03/17/15 0001    Manual Therapy   Manual Therapy Myofascial release   Myofascial Release MFR/TPR to B cervical paraspinals, UT, Levator Scapula and suboccipital release in supine to decrease tightness and pain                  PT Short Term Goals - 03/10/15 1815    PT SHORT TERM GOAL #1   Title Ind with a HEP.   Time 2   Period Weeks   Status New   PT SHORT TERM GOAL #2   Title Bilateral active cervical rotation= 60 degrees.   Time 2   Period Weeks   Status New           PT Long Term Goals - 03/10/15 1815    PT  LONG TERM GOAL #1   Title Increase active cervical rotation to 70 degrees+ so patient can turn head more easily while driving.   Time 6   Period Weeks   Status New   PT LONG TERM GOAL #2   Title Eliminate headaches.   Time 6   Period Weeks   Status New   PT LONG TERM GOAL #3   Title Sit 30 minutes with pain not > 3/10.   Time 6   Period Weeks   Status New   PT LONG TERM GOAL #4   Title Sleep 6 hours undisturbed.   Time 6   Period Weeks   Status New               Plan - 03/17/15 1529    Clinical Impression Statement Patient tolerated today's treatment well with only one experience of shooting pain during today's treatment. Increased tightness noted in R cervical musculature when compared to L cervical musculature especially R cervical paraspinals and subocciptials. Tightness also noted into B UT, Levator Scapula today which patient reports that she experiences pain into her shoulders. Goals remain on-going seocndary to continued headaches and increased pain. Patient still had headache following today's treatment but reported decreased intensity with 4/10.   Pt will benefit from skilled therapeutic intervention in order to improve on the following deficits Pain;Decreased range of motion;Decreased activity tolerance   Rehab Potential Good   PT Frequency 2x / week   PT Duration 6 weeks   PT Treatment/Interventions ADLs/Self Care Home Management;Electrical Stimulation;Therapeutic exercise;Ultrasound;Manual techniques   PT Next Visit Plan Continue manual techniques and modalities as needed for headaches and neck pain.   PT Home Exercise Plan chin tucks, cervical extension   Consulted and Agree with Plan of Care Patient        Problem List Patient Active Problem List   Diagnosis Date Noted  . Anxiety state 03/03/2015  . Hip bursitis 10/22/2014  . Osteophyte of hip 10/21/2014  . Epigastric cramping 11/09/2013  . Nausea with vomiting 11/06/2013  . Rheumatoid arthritis  ([place]) 11/01/2013  . S/P laparoscopic sleeve gastrectomy 10/30/2013  . Anemia 06/04/2013  . CAP (community acquired pneumonia) 02/17/2013  . Restless leg syndrome 09/18/2012  . Morbid obesity with BMI of 45.0-49.9, adult ([place]) 01/18/2012  .  Right gluteus tear 01/17/2012  . Nipple discharge in female 07/08/2011  . Chronic insomnia 06/18/2011  . Discharge from nipple 01/06/2011  . TMJ PAIN 01/12/2010  . DEPRESSION 12/10/2009  . SINUSITIS, ACUTE 12/30/2008  . Lumbago 12/30/2008  . EDEMA 10/03/2008  . Hyperlipidemia 08/27/2008  . Essential hypertension 08/27/2008  . GERD 08/27/2008    Wynelle Fanny, PTA 03/17/2015, 3:33 PM  [facility] [address] [place], [place], [address] Phone: [phone]   Fax:  [phone]  Name: JALANI CULLIFER MRN: [id number] Date of Birth: 15-Apr-1955  PHYSICAL THERAPY DISCHARGE SUMMARY  Visits from Start of Care: 2.  Current functional level related to goals / functional outcomes: See above.   Remaining deficits: See below.   Education / Equipment: HEP. Plan: Patient agrees to discharge.  Patient goals were not met. Patient is being discharged due to not returning since the last visit.  ?????        [place] Applegate MPT

## 2015-03-19 DIAGNOSIS — G5762 Lesion of plantar nerve, left lower limb: Secondary | ICD-10-CM | POA: Diagnosis not present

## 2015-03-20 ENCOUNTER — Encounter: Payer: Medicare Other | Admitting: Physical Therapy

## 2015-03-23 ENCOUNTER — Emergency Department (HOSPITAL_COMMUNITY)
Admission: EM | Admit: 2015-03-23 | Discharge: 2015-03-23 | Disposition: A | Discharge status: Home / Self Care | Payer: BLUE CROSS/BLUE SHIELD | Attending: Emergency Medicine | Admitting: Emergency Medicine

## 2015-03-23 ENCOUNTER — Encounter (HOSPITAL_COMMUNITY): Payer: Self-pay

## 2015-03-23 DIAGNOSIS — Z79818 Long term (current) use of other agents affecting estrogen receptors and estrogen levels: Secondary | ICD-10-CM | POA: Insufficient documentation

## 2015-03-23 DIAGNOSIS — Z872 Personal history of diseases of the skin and subcutaneous tissue: Secondary | ICD-10-CM | POA: Insufficient documentation

## 2015-03-23 DIAGNOSIS — R51 Headache: Secondary | ICD-10-CM | POA: Insufficient documentation

## 2015-03-23 DIAGNOSIS — Z8701 Personal history of pneumonia (recurrent): Secondary | ICD-10-CM | POA: Insufficient documentation

## 2015-03-23 DIAGNOSIS — Z8709 Personal history of other diseases of the respiratory system: Secondary | ICD-10-CM | POA: Diagnosis not present

## 2015-03-23 DIAGNOSIS — Z8739 Personal history of other diseases of the musculoskeletal system and connective tissue: Secondary | ICD-10-CM | POA: Diagnosis not present

## 2015-03-23 DIAGNOSIS — Z8719 Personal history of other diseases of the digestive system: Secondary | ICD-10-CM | POA: Diagnosis not present

## 2015-03-23 DIAGNOSIS — Z7952 Long term (current) use of systemic steroids: Secondary | ICD-10-CM | POA: Diagnosis not present

## 2015-03-23 DIAGNOSIS — R519 Headache, unspecified: Secondary | ICD-10-CM

## 2015-03-23 DIAGNOSIS — Z8639 Personal history of other endocrine, nutritional and metabolic disease: Secondary | ICD-10-CM | POA: Diagnosis not present

## 2015-03-23 DIAGNOSIS — Z85828 Personal history of other malignant neoplasm of skin: Secondary | ICD-10-CM | POA: Diagnosis not present

## 2015-03-23 DIAGNOSIS — Z87891 Personal history of nicotine dependence: Secondary | ICD-10-CM | POA: Diagnosis not present

## 2015-03-23 DIAGNOSIS — Z8619 Personal history of other infectious and parasitic diseases: Secondary | ICD-10-CM | POA: Diagnosis not present

## 2015-03-23 DIAGNOSIS — I10 Essential (primary) hypertension: Secondary | ICD-10-CM | POA: Insufficient documentation

## 2015-03-23 DIAGNOSIS — Z79899 Other long term (current) drug therapy: Secondary | ICD-10-CM | POA: Insufficient documentation

## 2015-03-23 DIAGNOSIS — Z862 Personal history of diseases of the blood and blood-forming organs and certain disorders involving the immune mechanism: Secondary | ICD-10-CM | POA: Diagnosis not present

## 2015-03-23 DIAGNOSIS — Z880 Allergy status to penicillin: Secondary | ICD-10-CM | POA: Diagnosis not present

## 2015-03-23 MED ORDER — DIPHENHYDRAMINE HCL 50 MG/ML IJ SOLN
12.5000 mg | Freq: Once | INTRAMUSCULAR | Status: AC
  Administered 2015-03-23: 12.5 mg via INTRAVENOUS
  Filled 2015-03-23: qty 1

## 2015-03-23 MED ORDER — SODIUM CHLORIDE 0.9 % IV BOLUS (SEPSIS)
1000.0000 mL | Freq: Once | INTRAVENOUS | Status: AC
  Administered 2015-03-23: 1000 mL via INTRAVENOUS

## 2015-03-23 MED ORDER — PROCHLORPERAZINE EDISYLATE 5 MG/ML IJ SOLN
10.0000 mg | Freq: Once | INTRAMUSCULAR | Status: AC
  Administered 2015-03-23: 10 mg via INTRAVENOUS
  Filled 2015-03-23: qty 2

## 2015-03-23 MED ORDER — DIPHENHYDRAMINE HCL 12.5 MG/5ML PO ELIX: 12.5000 mg | ORAL_SOLUTION | Freq: Once | ORAL | Status: DC

## 2015-03-23 MED ORDER — KETOROLAC TROMETHAMINE 30 MG/ML IJ SOLN
15.0000 mg | Freq: Once | INTRAMUSCULAR | Status: AC
  Administered 2015-03-23: 15 mg via INTRAVENOUS
  Filled 2015-03-23: qty 1

## 2015-03-23 NOTE — ED Notes (Signed)
Pt reports that she woke up at 2 am with N/V/D and head hurting.Reports worse headache she has ever had, vomited at least 10 times today

## 2015-03-23 NOTE — ED Provider Notes (Signed)
CSN: [id number]     Arrival date & time 03/23/15  1736 History   First MD Initiated Contact with Patient 03/23/15 1830     Chief Complaint  Patient presents with  . Headache     (Consider location/radiation/quality/duration/timing/severity/associated sxs/prior Treatment) HPI   60 year old female with headache. Patient reports having a very mild headache when she went to bed last night. She will call up at approximately 2 AM with nausea and vomiting. She's been having worsening headaches since then. Pain is diffuse worse the occipital region. No appreciable exacerbating relieving factors. Vomited several times throughout the day. Subjective fever. No acute visual changes. No acute numbness, tingling or focal loss of strength. Denies use of blood thinning medication. Try taking Tylenol earlier today with no significant effect. She is chronically on methotrexate and prednisone for arthritis.   Past Medical History  Diagnosis Date  . ANGIOMA 10/03/2008    REMOVED FROM RIGHT LOWER LEG-BENIGN  . HYPERLIPIDEMIA 08/27/2008  . GERD 08/27/2008  . SEBORRHEIC KERATOSIS 08/27/2008  . Arthritis     RA  . Fibromyalgia   . Complication of anesthesia   . PONV (postoperative nausea and vomiting)   . History of nonmelanoma skin cancer   . Cancer (HCC)     BASAL CELL SKIN CANCER  . HYPERTENSION 08/27/2008    pt is currently on no meds   . Pneumonia     hx of   . Bronchitis     hx of   . Anxiety   . History of hiatal hernia     hx of has had surgically repaired  . History of blood transfusion     1995  . History of measles   . History of mumps    Past Surgical History  Procedure Laterality Date  . Cesarean section  1980    times 2; 1980 and 1982  . Cesarean section  1982  . Cholecystectomy  1997  . Abdominal hysterectomy  1995  . Foot surgery  2004, 2005, 2008    bilat secondary to neuropathy   . Knee arthroscopy  2007, 2008, 2009  . Nasal sinus surgery  2013  . Breast ductal system excision   08/16/2011    Procedure: EXCISION DUCTAL SYSTEM BREAST;  Surgeon: Joyice Faster. Cornett, MD;  Location: [facility];  Service: General;  Laterality: Left;  left breast duct excision  . Open surgical repair of gluteal tendon  01/17/2012    Procedure: OPEN SURGICAL REPAIR OF GLUTEAL TENDON;  Surgeon: Mauri Pole, MD;  Location: [facility];  Service: Orthopedics;  Laterality: Right;  . Joint replacement  10,12    lt total knee X2  . Laparoscopic gastric sleeve resection N/A 10/30/2013    Procedure: LAPAROSCOPIC GASTRIC SLEEVE RESECTION WITH HIATAL HERNIA REPAIR AND UPPER ENDOSCOPY;  Surgeon: Greer Pickerel, MD;  Location: [facility];  Service: General;  Laterality: N/A;  . Appendectomy    . Excision/release bursa hip Right 10/21/2014    Procedure: OPEN RIGHT HIP BURSECTOMY, EXOSECTOMY;  Surgeon: Paralee Cancel, MD;  Location: [facility];  Service: Orthopedics;  Laterality: Right;   Family History  Problem Relation Age of Onset  . Cancer Mother     breast  . Cancer Father     prostate  . Diabetes Other     grandparent  . Heart disease Other     parent  . Kidney disease Other     parent  . Cancer Other     parent  .  Hypertension Other   . Hyperlipidemia Other    Social History  Substance Use Topics  . Smoking status: Former Smoker -- 0.25 packs/day for 4 years    Types: Cigarettes    Quit date: 02/23/1984  . Smokeless tobacco: Never Used  . Alcohol Use: No   OB History    No data available     Review of Systems  All systems reviewed and negative, other than as noted in HPI.   Allergies  Bee venom; Codeine sulfate; and Penicillins  Home Medications   Prior to Admission medications   Medication Sig Start Date End Date Taking? Authorizing Provider  acetaminophen (TYLENOL) 500 MG tablet Take 1,000 mg by mouth every 6 (six) hours as needed for pain.    Yes Historical Provider, MD  oxyCODONE (OXY IR/ROXICODONE) 5 MG immediate release tablet Take 1-2 tablets (5-10 mg total) by mouth  every 4 (four) hours as needed for severe pain. 10/22/14  Yes Danae Orleans, PA-C  Biotin 1000 MCG tablet Take 3,000 mcg by mouth daily.    Historical Provider, MD  calcium-vitamin D (OSCAL WITH D) 500-200 MG-UNIT per tablet Take 2 tablets by mouth 2 (two) times daily.    Historical Provider, MD  cyclobenzaprine (FLEXERIL) 10 MG tablet Take 1 tablet (10 mg total) by mouth 3 (three) times daily as needed for muscle spasms. 03/03/15   Eulas Post, MD  EPINEPHrine (EPIPEN 2-PAK) 0.3 mg/0.3 mL IJ SOAJ injection Inject 0.3 mLs (0.3 mg total) into the muscle once. 09/19/14   Eulas Post, MD  estradiol (ESTRACE) 1 MG tablet Take 1 tablet (1 mg total) by mouth daily. 01/10/15   Eulas Post, MD  etanercept (ENBREL) 50 MG/ML injection Inject 0.98 mLs (50 mg total) into the skin once a week. Resume end of next week Patient taking differently: Inject 50 mg into the skin once a week. Friday 11/01/13   Greer Pickerel, MD  folic acid (FOLVITE) 1 MG tablet Take 2 mg by mouth daily.     Historical Provider, MD  hydrocortisone (ANUSOL-HC) 2.5 % rectal cream Place 1 application rectally 2 (two) times daily. 12/24/14   Laurey Morale, MD  lidocaine [facility]) 3 % CREA cream Apply 1 application topically as needed (For Hemmorrhoids). 12/24/14   Laurey Morale, MD  methotrexate (50 MG/ML) 1 gm SOLR May resume previous dose at  end of next week Patient taking differently: Inject 50 mg into the skin once a week. Friday 11/01/13   Greer Pickerel, MD  Pediatric Multiple Vit-C-FA (FLINSTONES GUMMIES OMEGA-3 DHA PO) Take 4 tablets by mouth daily.    Historical Provider, MD  predniSONE (DELTASONE) 5 MG tablet Take 5 mg by mouth daily with breakfast.    Historical Provider, MD  scopolamine (TRANSDERM-SCOP) 1 MG/3DAYS Place 1 patch (1.5 mg total) onto the skin every 3 (three) days. Behind ear 12/25/14   Eulas Post, MD  sertraline (ZOLOFT) 50 MG tablet Take one and one half tablet once daily Patient taking differently:  Take 75 mg by mouth daily.  03/14/15   Eulas Post, MD  vitamin B-12 (CYANOCOBALAMIN) 1000 MCG tablet Take 1,000 mcg by mouth daily.    Historical Provider, MD  zolpidem (AMBIEN) 10 MG tablet Take 1 tablet (10 mg total) by mouth at bedtime as needed for sleep. 03/06/15   Eulas Post, MD   BP 118/73 mmHg  Pulse 97  Temp(Src) 98.6 F (37 C) (Temporal)  Resp 16  Ht 5\' 4"  (1.626  m)  Wt 193 lb (87.544 kg)  BMI 33.11 kg/m2  SpO2 98% Physical Exam  Constitutional: She is oriented to person, place, and time. She appears well-developed and well-nourished. No distress.  HENT:  Head: Normocephalic and atraumatic.  Eyes: Conjunctivae and EOM are normal. Right eye exhibits no discharge. Left eye exhibits no discharge.  Neck: Neck supple.  No nuchal rigidity  Cardiovascular: Normal rate, regular rhythm and normal heart sounds.  Exam reveals no gallop and no friction rub.   No murmur heard. Pulmonary/Chest: Effort normal and breath sounds normal. No respiratory distress.  Abdominal: Soft. She exhibits no distension. There is no tenderness.  Musculoskeletal: She exhibits no edema or tenderness.  Neurological: She is alert and oriented to person, place, and time. No cranial nerve deficit. She exhibits normal muscle tone. Coordination normal.  Speech clear. Content appropriate. Follows commands. Cranial nerves II through XII are intact. Strength is 5 out of 5 bilateral upper lower extremities. Good finger to nose testing bilaterally.  Skin: Skin is warm and dry.  Psychiatric: She has a normal mood and affect. Her behavior is normal. Thought content normal.  Nursing note and vitals reviewed.   ED Course  Procedures (including critical care time) Labs Review Labs Reviewed - No data to display  Imaging Review No results found. I have personally reviewed and evaluated these images and lab results as part of my medical decision-making.   EKG Interpretation None      MDM   Final  diagnoses:  Nonintractable headache, unspecified chronicity pattern, unspecified headache type    59yF with headache. Suspect primary HA. Consider emergent secondary causes such as bleed, infectious or mass but doubt. There is no history of trauma. Pt has a nonfocal neurological exam. Afebrile and neck supple. No use of blood thinning medication. Consider ocular etiology such as acute angle closure glaucoma but doubt. Consider infectious etiology with methotrexate/prednisoe usage but doubt as well. Pt denies acute change in visual acuity and eye exam unremarkable. Doubt temporal arteritis. no temporal tenderness and temporal artery pulsations palpable. Doubt CO poisoning. No contacts with similar symptoms. Doubt venous thrombosis. Doubt carotid or vertebral arteries dissection. Symptoms improved significantly with meds. Feel that can be safely discharged, but strict return precautions discussed. Outpt fu.     Virgel Manifold, MD 03/26/15 [phone]

## 2015-03-23 NOTE — Discharge Instructions (Signed)

## 2015-03-24 ENCOUNTER — Encounter: Payer: BLUE CROSS/BLUE SHIELD | Admitting: Physical Therapy

## 2015-04-02 DIAGNOSIS — G5762 Lesion of plantar nerve, left lower limb: Secondary | ICD-10-CM | POA: Diagnosis not present

## 2015-04-02 DIAGNOSIS — M7752 Other enthesopathy of left foot: Secondary | ICD-10-CM | POA: Diagnosis not present

## 2015-04-03 ENCOUNTER — Encounter: Payer: Self-pay | Admitting: Family Medicine

## 2015-04-09 ENCOUNTER — Ambulatory Visit (INDEPENDENT_AMBULATORY_CARE_PROVIDER_SITE_OTHER): Payer: BLUE CROSS/BLUE SHIELD | Admitting: Family Medicine

## 2015-04-09 VITALS — BP 100/80 | HR 102 | Temp 98.3°F | Ht 64.0 in | Wt 195.3 lb

## 2015-04-09 DIAGNOSIS — M5412 Radiculopathy, cervical region: Secondary | ICD-10-CM

## 2015-04-09 NOTE — Patient Instructions (Signed)

## 2015-04-09 NOTE — Progress Notes (Signed)
Subjective:    Patient ID: [name], female    DOB: Mar 17, 1955, 60 y.o.   MRN: [id number]  HPI Patient seen with persistent right cervical neck pains with intermittent radiation down right upper extremity Duration has been several months-over 6 months. She does have rheumatoid arthritis followed by rheumatology and is on chronic low-dose prednisone. She's tried other medications such as Tylenol without relief. Her pain is intense at times sometimes 9 out of 10 pain. Pain is mostly right lower cervical region with occasional radiation to the hand. She still occasional numbness involving the middle and ring finger the right hand No definite weakness.  Plain x-ray showed some mild degenerative changes. Physical therapy without improvement. She's not gotten any relief with heat or ice. She feels anything the pain is been progressive over the past several months.  Past Medical History  Diagnosis Date  . ANGIOMA 10/03/2008    REMOVED FROM RIGHT LOWER LEG-BENIGN  . HYPERLIPIDEMIA 08/27/2008  . GERD 08/27/2008  . SEBORRHEIC KERATOSIS 08/27/2008  . Arthritis     RA  . Fibromyalgia   . Complication of anesthesia   . PONV (postoperative nausea and vomiting)   . History of nonmelanoma skin cancer   . Cancer (HCC)     BASAL CELL SKIN CANCER  . HYPERTENSION 08/27/2008    pt is currently on no meds   . Pneumonia     hx of   . Bronchitis     hx of   . Anxiety   . History of hiatal hernia     hx of has had surgically repaired  . History of blood transfusion     1995  . History of measles   . History of mumps    Past Surgical History  Procedure Laterality Date  . Cesarean section  1980    times 2; 1980 and 1982  . Cesarean section  1982  . Cholecystectomy  1997  . Abdominal hysterectomy  1995  . Foot surgery  2004, 2005, 2008    bilat secondary to neuropathy   . Knee arthroscopy  2007, 2008, 2009  . Nasal sinus surgery  2013  . Breast ductal system excision  08/16/2011   Procedure: EXCISION DUCTAL SYSTEM BREAST;  Surgeon: Joyice Faster. Cornett, MD;  Location: [facility];  Service: General;  Laterality: Left;  left breast duct excision  . Open surgical repair of gluteal tendon  01/17/2012    Procedure: OPEN SURGICAL REPAIR OF GLUTEAL TENDON;  Surgeon: Mauri Pole, MD;  Location: [facility];  Service: Orthopedics;  Laterality: Right;  . Joint replacement  10,12    lt total knee X2  . Laparoscopic gastric sleeve resection N/A 10/30/2013    Procedure: LAPAROSCOPIC GASTRIC SLEEVE RESECTION WITH HIATAL HERNIA REPAIR AND UPPER ENDOSCOPY;  Surgeon: Greer Pickerel, MD;  Location: [facility];  Service: General;  Laterality: N/A;  . Appendectomy    . Excision/release bursa hip Right 10/21/2014    Procedure: OPEN RIGHT HIP BURSECTOMY, EXOSECTOMY;  Surgeon: Paralee Cancel, MD;  Location: [facility];  Service: Orthopedics;  Laterality: Right;    reports that she quit smoking about 31 years ago. Her smoking use included Cigarettes. She has a 1 pack-year smoking history. She has never used smokeless tobacco. She reports that she does not drink alcohol or use illicit drugs. family history includes Cancer in her father, mother, and other; Diabetes in her other; Heart disease in her other; Hyperlipidemia in her other; Hypertension in her other;  Kidney disease in her other. Allergies  Allergen Reactions  . Bee Venom Anaphylaxis  . Codeine Sulfate Other (See Comments)    GI upset  . Penicillins Rash       Review of Systems  Constitutional: Negative for fever and chills.  Respiratory: Negative for shortness of breath.   Cardiovascular: Negative for chest pain.  Musculoskeletal: Positive for neck pain.  Neurological: Positive for numbness. Negative for weakness and headaches.       Objective:   Physical Exam  Constitutional: She appears well-developed and well-nourished.  Cardiovascular: Normal rate and regular rhythm.   Pulmonary/Chest: Effort normal and breath sounds  normal. No respiratory distress. She has no wheezes. She has no rales.  Musculoskeletal:  Patient has pain with neck flexion, extension, and lateral bending to the right or left  Neurological:  Symmetric and preserved reflexes upper extremities. No focal strength deficits. No obvious muscle atrophy.          Assessment & Plan:  Persistent right cervical radiculitis symptoms-now for several months. No obvious weakness. She has tried multiple conservative therapies as above without relief. recent physical therapy did not help. Set up MRI to further assess

## 2015-04-17 ENCOUNTER — Ambulatory Visit
Admission: RE | Admit: 2015-04-17 | Discharge: 2015-04-17 | Disposition: A | Discharge status: Home / Self Care | Payer: BLUE CROSS/BLUE SHIELD | Source: Ambulatory Visit | Attending: Family Medicine | Admitting: Family Medicine

## 2015-04-17 DIAGNOSIS — M150 Primary generalized (osteo)arthritis: Secondary | ICD-10-CM | POA: Diagnosis not present

## 2015-04-17 DIAGNOSIS — M797 Fibromyalgia: Secondary | ICD-10-CM | POA: Diagnosis not present

## 2015-04-17 DIAGNOSIS — M0589 Other rheumatoid arthritis with rheumatoid factor of multiple sites: Secondary | ICD-10-CM | POA: Diagnosis not present

## 2015-04-17 DIAGNOSIS — M50221 Other cervical disc displacement at C4-C5 level: Secondary | ICD-10-CM | POA: Diagnosis not present

## 2015-04-17 DIAGNOSIS — M542 Cervicalgia: Secondary | ICD-10-CM | POA: Diagnosis not present

## 2015-04-17 DIAGNOSIS — M5412 Radiculopathy, cervical region: Secondary | ICD-10-CM

## 2015-04-21 ENCOUNTER — Encounter: Payer: Self-pay | Admitting: Podiatry

## 2015-04-21 ENCOUNTER — Ambulatory Visit (INDEPENDENT_AMBULATORY_CARE_PROVIDER_SITE_OTHER): Payer: BLUE CROSS/BLUE SHIELD | Admitting: Podiatry

## 2015-04-21 VITALS — BP 147/68 | HR 88 | Resp 18

## 2015-04-21 DIAGNOSIS — M2012 Hallux valgus (acquired), left foot: Secondary | ICD-10-CM

## 2015-04-21 DIAGNOSIS — M2450 Contracture, unspecified joint: Secondary | ICD-10-CM | POA: Diagnosis not present

## 2015-04-21 DIAGNOSIS — D3610 Benign neoplasm of peripheral nerves and autonomic nervous system, unspecified: Secondary | ICD-10-CM | POA: Diagnosis not present

## 2015-04-21 NOTE — Progress Notes (Signed)
   Subjective:    Patient ID: [name], female    DOB: Apr 11, 1955, 60 y.o.   MRN: [id number]  HPI  60 year old female presents the office if consented the left pain which has been ongoing for quite some time. She has previously had 2 surgeries to her left foot and also surgeries to her right foot. The surgeries to her left foot she states was done because she was having lack of motion in her toes and pain to her toes. The surgeries were done by Dr. Radonna Ricker. She says she gets some numbness to her second, third, fourth toe of the toes not moving as much as they did. She also has some pain overlying which she points to bunion. She was seen Dr. Gershon Mussel and she was having multiple injections which she states did not help. She states that the area throbs when she tries to walk and she points the toes. Does have custom orthotics at home but she states they do not help. No other complaints at this time.   Review of Systems  All other systems reviewed and are negative.      Objective:   Physical Exam General: AAO x3, NAD  Dermatological: Skin is warm, dry and supple bilateral. Nails x 10 are well manicured; remaining integument appears unremarkable at this time. There are no open sores, no preulcerative lesions, no rash or signs of infection present.  Vascular: Dorsalis Pedis artery and Posterior Tibial artery pedal pulses are 2/4 bilateral with immedate capillary fill time. Pedal hair growth present. No varicosities and no lower extremity edema present bilateral. There is no pain with calf compression, swelling, warmth, erythema.   Neruologic: Grossly intact via light touch bilateral. Vibratory intact via tuning fork bilateral. Protective threshold with Semmes Wienstein monofilament intact to all pedal sites bilateral. Patellar and Achilles deep tendon reflexes 2+ bilateral. No Babinski or clonus noted bilateral.   Musculoskeletal: There is mild to moderate HAV present is tenderness palpation along  the medial aspect of the first metatarsal head upon the bunion. There is also pain with second, third MPJ range of motion and there is mild tenderness overlying the MPJs of the second, third, fourth digits. There is prominent metatarsal heads and atrophy of the fat pad. There is decreased range of motion of MTPJ's the lesser digits. There is no palpable neuroma identified at this time. No other areas of tenderness to bilateral lower extremities. The third and fourth toe are hammered. MMT 5/5.  Gait: Unassisted, Nonantalgic.      Assessment & Plan:  60 year old female with digital deformity, bunion, likely pinched nerve versus neuroma left foot. -Treatment options discussed including all alternatives, risks, and complications -X-rays which she brought the office they were reviewed. There is screw fixation the second, third, fourth metatarsal as well as fifth metatarsal head resection. There is hammertoe repair noticeable the lesser digits. -At this time I discussed etiologies of her symptoms. Discussed bunion etiology. Also discussed nerve etiology likely causing her numbness and tingling. I don't with that she has necessarily a neuroma but the nerves or any pen stop to the bone callus formation which is present on the x-ray and there is minimal space into the metatarsal heads. -At this point we'll obtain an ultrasound will neuroma -I discussed surgical intervention with the patient versus conservative treatment. She'll consider her options. -Follow-up after ultrasound.  [name], DPM

## 2015-04-22 ENCOUNTER — Telehealth: Payer: Self-pay | Admitting: *Deleted

## 2015-04-22 ENCOUNTER — Ambulatory Visit: Payer: BLUE CROSS/BLUE SHIELD

## 2015-04-22 ENCOUNTER — Encounter: Payer: Self-pay | Admitting: *Deleted

## 2015-04-22 DIAGNOSIS — D3610 Benign neoplasm of peripheral nerves and autonomic nervous system, unspecified: Secondary | ICD-10-CM

## 2015-04-22 NOTE — Telephone Encounter (Addendum)
-----   Message from Trula Slade, DPM sent at 04/21/2015  8:12 PM EST ----- Can you please order an ultrasound of her left foot in the 2nd and 3rd interspace to eval for neuroma? Dictation is complete. 04/22/2015-PT WAS SEEN IN [place] 04/21/2015, I asked pt if she would like to have the [place] at [place].  Pt states that would be fine.  Orders faxed to [place].  04/30/2015-DR. Kinmundy MRI shows no definitive neuroma, will discuss other therapies at 05/19/2015 appt.  Informed pt of Dr. Leigh Aurora statement.

## 2015-04-23 ENCOUNTER — Other Ambulatory Visit: Payer: Self-pay | Admitting: Podiatry

## 2015-04-25 ENCOUNTER — Ambulatory Visit
Admission: RE | Admit: 2015-04-25 | Discharge: 2015-04-25 | Disposition: A | Discharge status: Home / Self Care | Payer: BLUE CROSS/BLUE SHIELD | Source: Ambulatory Visit | Attending: Podiatry | Admitting: Podiatry

## 2015-04-25 DIAGNOSIS — D3610 Benign neoplasm of peripheral nerves and autonomic nervous system, unspecified: Secondary | ICD-10-CM

## 2015-04-28 ENCOUNTER — Other Ambulatory Visit: Payer: Self-pay | Admitting: Family Medicine

## 2015-05-19 ENCOUNTER — Encounter: Payer: Self-pay | Admitting: Podiatry

## 2015-05-19 ENCOUNTER — Ambulatory Visit (INDEPENDENT_AMBULATORY_CARE_PROVIDER_SITE_OTHER): Payer: BLUE CROSS/BLUE SHIELD | Admitting: Podiatry

## 2015-05-19 VITALS — BP 150/78 | HR 77 | Resp 18

## 2015-05-19 DIAGNOSIS — M79672 Pain in left foot: Secondary | ICD-10-CM | POA: Diagnosis not present

## 2015-05-19 DIAGNOSIS — G8929 Other chronic pain: Secondary | ICD-10-CM | POA: Diagnosis not present

## 2015-05-19 DIAGNOSIS — M216X2 Other acquired deformities of left foot: Secondary | ICD-10-CM | POA: Diagnosis not present

## 2015-05-19 DIAGNOSIS — M2042 Other hammer toe(s) (acquired), left foot: Secondary | ICD-10-CM | POA: Diagnosis not present

## 2015-05-19 DIAGNOSIS — M2012 Hallux valgus (acquired), left foot: Secondary | ICD-10-CM | POA: Diagnosis not present

## 2015-05-19 NOTE — Patient Instructions (Signed)

## 2015-05-20 DIAGNOSIS — M2040 Other hammer toe(s) (acquired), unspecified foot: Secondary | ICD-10-CM | POA: Insufficient documentation

## 2015-05-20 DIAGNOSIS — G8929 Other chronic pain: Secondary | ICD-10-CM | POA: Insufficient documentation

## 2015-05-20 DIAGNOSIS — M79673 Pain in unspecified foot: Secondary | ICD-10-CM

## 2015-05-20 DIAGNOSIS — M2010 Hallux valgus (acquired), unspecified foot: Secondary | ICD-10-CM | POA: Insufficient documentation

## 2015-05-20 DIAGNOSIS — D3610 Benign neoplasm of peripheral nerves and autonomic nervous system, unspecified: Secondary | ICD-10-CM | POA: Insufficient documentation

## 2015-05-20 DIAGNOSIS — M216X9 Other acquired deformities of unspecified foot: Secondary | ICD-10-CM | POA: Insufficient documentation

## 2015-05-20 NOTE — Progress Notes (Signed)
Patient ID: [name], female   DOB: [birth year], 60 y.o.   MRN: [id number]   Subjective: 60 year old female presents the office today for follow-up evaluation of ongoing left foot pain. She said that she has had ongoing pain to her foot for several months and she has tried multiple conservative treatment for any relief of symptoms. She's had injections, orthotics for any relief and she is tried changed shoes. The majority of pain she is having is overlying the second, third, fourth metatarsals on the metatarsal heads. She has increased pain to the bunion it she has on her left foot.  She is presented to service her left foot which were done by Dr. Radonna Ricker. She states these are due to lack of motion to her toes. She is had a metatarsal osteotomy to the second, third, fourth as well as hammertoe repair and fifth metatarsal head resection.  No other complaints at this time.  Subjectiv: General: AAO x3, NAD  Dermatological: Skin is warm, dry and supple bilateral. Nails x 10 are well manicured; remaining integument appears unremarkable at this time. There are no open sores, no preulcerative lesions, no rash or signs of infection present.  Vascular: Dorsalis Pedis artery and Posterior Tibial artery pedal pulses are 2/4 bilateral with immedate capillary fill time. Pedal hair growth present. No varicosities and no lower extremity edema present bilateral. There is no pain with calf compression, swelling, warmth, erythema.   Neruologic: Grossly intact via light touch bilateral. Vibratory intact via tuning fork bilateral. Protective threshold with Semmes Wienstein monofilament intact to all pedal sites bilateral. Patellar and Achilles deep tendon reflexes 2+ bilateral. No Babinski or clonus noted bilateral.   Musculoskeletal: There is mild to moderate HAV present is tenderness palpation along the medial aspect of the first metatarsal head upon the bunion. There is also pain with second, third, and fouth  MPJ range of motion and there is mild tenderness overlying the MPJs of the second, third, fourth digits. Upon palpation of the metatarsal heads of 2,3, 4 there is significant tenderness overlying this area. There is prominent metatarsal heads and atrophy of the fat pad. There is decreased range of motion of MTPJ's the lesser digits. Hammertoe contractures are present in the second, third, fourth toes. There is tenderness upon these toes on the dorsal PIPJ. There is no palpable neuroma identified at this time. No other areas of tenderness to bilateral lower extremities. The third and fourth toe are hammered. MMT 5/5.  Gait: Unassisted, Nonantalgic.   Assessment: Chronic metatarsalgia left foot, HAV, hammertoes.  Plan: -Treatment options discussed including all alternatives, risks, and complications -Etiology of symptoms were discussed -Again reviewed patient x-rays. Discussed these with her. There appears to be changes to the MPJs and this hardware fixation seen in the second, third, fourth metatarsals. Metatarsal head resection present of the fifth. Mild to moderate HAV present. -Discussed ultrasound results. Negative for neuroma. -I discussed both conservative and surgical treatment options. -At this time such proceed with surgical intervention due to her chronic pain. I discussed with her this may not relieve all of her symptoms and she understands this. Given rheumatoid pain is along the metatarsal heads I discussed with her metatarsal head resection 2, 3, 4 in conjunction with hammertoe repair and K wire fixation. Also Austin bunionectomy. -The incision placement as well as the postoperative course was discussed with the patient. I discussed risks of the surgery which include, but not limited to, infection, bleeding, pain, swelling, need for further surgery, delayed or  nonhealing, painful or ugly scar, numbness or sensation changes, over/under correction, recurrence, transfer lesions, further  deformity, hardware failure, DVT/PE, loss of toe/foot. Patient understands these risks and wishes to proceed with surgery. The surgical consent was reviewed with the patient all 3 pages were signed. No promises or guarantees were given to the outcome of the procedure. All questions were answered to the best of my ability. Before the surgery the patient was encouraged to call the office if there is any further questions. The surgery will be performed at the [facility] on an outpatient basis. *Likely NWB in Boot post-op; discussed Lovenox for DVT ppx and will need medical clearance.  [name], DPM

## 2015-05-30 ENCOUNTER — Telehealth: Payer: Self-pay | Admitting: *Deleted

## 2015-05-30 ENCOUNTER — Encounter: Payer: Self-pay | Admitting: *Deleted

## 2015-05-30 ENCOUNTER — Other Ambulatory Visit: Payer: Self-pay | Admitting: *Deleted

## 2015-05-30 ENCOUNTER — Other Ambulatory Visit: Payer: Self-pay | Admitting: Family Medicine

## 2015-05-30 MED ORDER — ENOXAPARIN SODIUM 40 MG/0.4ML ~~LOC~~ SOLN: 40.0000 mg | Freq: Once | SUBCUTANEOUS | Status: DC

## 2015-05-30 NOTE — Telephone Encounter (Signed)
I calling to let you know I just sent a prescription to your pharmacist for Lovenox.  Dr. Jacqualyn Posey wants you to use it after surgery to prevent DVT.  Have you used it before?  "No, I have never done it before."  Bring one of them with you to the surgical center and they will show you how to use it.  "Okay, I will.  I haven't heard from them is there anything I need to do pre-op?"  No, there's nothing you need to do.  They will call you a day or two prior to surgery with the arrival time.  I sent Dr. Elease Hashimoto a medical clearance letter for surgery.  We are waiting for a response.

## 2015-05-31 ENCOUNTER — Other Ambulatory Visit: Payer: Self-pay | Admitting: Family Medicine

## 2015-06-03 ENCOUNTER — Telehealth: Payer: Self-pay | Admitting: *Deleted

## 2015-06-03 NOTE — Telephone Encounter (Signed)
Dr. Elease Hashimoto sent a medical clearance letter stating patient has no medical contraindications for foot surgery.

## 2015-06-04 ENCOUNTER — Telehealth: Payer: Self-pay | Admitting: *Deleted

## 2015-06-04 NOTE — Telephone Encounter (Signed)
Informed pt of Dr. Leigh Aurora orders to bring Lovenox to the surgery appt, for instructions of use.  Pt states understanding.

## 2015-06-05 ENCOUNTER — Telehealth: Payer: Self-pay | Admitting: *Deleted

## 2015-06-05 NOTE — Telephone Encounter (Addendum)
[name] [phone] asked for clarification in the Lovenox 40mg /0.83ml.  I spoke with Barbie the pharmacist and gave the orders for Lovenox to injected SQ daily for 14 days.  06/11/2015-Pt states her surgery for today was rescheduled for 2 weeks from now.  Pt states she has been off of Embrel, and Methotrexate for 2 weeks prior to today's originally scheduled surgery, so is she to remain off the Embrel and Methotrexate another 2 weeks.

## 2015-06-10 ENCOUNTER — Telehealth: Payer: Self-pay | Admitting: *Deleted

## 2015-06-10 NOTE — Telephone Encounter (Signed)
"  I'm returning your call."  I was calling to let you know we're going to have to reschedule your surgery.  Dr. Jacqualyn Posey is sick, he's been out today and yesterday.  Can we reschedule your surgery?  We can reschedule it to next Wednesday or we can schedule it for May 3.  "My husband is having surgery on April 24.  Let's just doing it the following Wednesday, May 3."  Thank you so much, I'll get it rescheduled.  I called and left Caren Griffins a message at [facility] to reschedule surgery from 06/11/2015 to 06/25/2015.

## 2015-06-10 NOTE — Telephone Encounter (Signed)
I left patient a message to call me back.  I need to reschedule her surgery per Dr. Jacqualyn Posey due to his illness.

## 2015-06-11 NOTE — Telephone Encounter (Signed)
Call the rheumatologist and ask them. If this is an issue then please add her on for sometime either Friday or next week so we can go ahead and get it done as she is off of the medication.

## 2015-06-16 ENCOUNTER — Encounter: Payer: BLUE CROSS/BLUE SHIELD | Admitting: Podiatry

## 2015-06-17 NOTE — Telephone Encounter (Signed)
"  I'm trying to get an answer to your question regarding the Embrel and Methotrexate.  Who prescribed the Embrel and Methotrexate to you?  "Dr. Amil Amen prescribed it.  My Arthritis is flaring up a little bit but I'm dealing with it.  I'm supposed to be off it 2 weeks prior to a procedure."  I'll call him and see what he suggests.    I called and spoke to [facility], answering service, she was going to send a message to the office.  She stated as of December, Dr. Amil Amen was no longer there with [facility].  She would try and get an answer from someone regarding the Methotrexate and Embrel.  I called Dr. Melissa Noon office on [address].  I spoke to [place].  She said she would give message to Dr. Melissa Noon nurse regarding what patient should do regarding the Methotrexate and Embrel.  They will call me back with a response.

## 2015-06-18 NOTE — Telephone Encounter (Signed)
"  I'm calling from Dr. Melissa Noon office.  You stated patient's surgery had been postponed because doctor was sick.  You were asking about her Embrel and her Methotrexate.  Dr. Amil Amen recommends the patient take a dose of the Enbrel and the Methotrexate today or tomorrow and then proceed with the surgery on May 3rd.  Then resume the medications the following week after surgery."  I called and informed patient of Dr. Melissa Noon instructions.  She repeated instructions to me.  I also left information on her home phone voicemail.

## 2015-06-25 ENCOUNTER — Encounter: Payer: Self-pay | Admitting: Podiatry

## 2015-06-25 DIAGNOSIS — M1990 Unspecified osteoarthritis, unspecified site: Secondary | ICD-10-CM | POA: Diagnosis not present

## 2015-06-25 DIAGNOSIS — M2012 Hallux valgus (acquired), left foot: Secondary | ICD-10-CM | POA: Diagnosis not present

## 2015-06-25 DIAGNOSIS — M069 Rheumatoid arthritis, unspecified: Secondary | ICD-10-CM | POA: Diagnosis not present

## 2015-06-25 DIAGNOSIS — M7742 Metatarsalgia, left foot: Secondary | ICD-10-CM | POA: Diagnosis not present

## 2015-06-25 DIAGNOSIS — M2042 Other hammer toe(s) (acquired), left foot: Secondary | ICD-10-CM | POA: Diagnosis not present

## 2015-06-26 ENCOUNTER — Telehealth: Payer: Self-pay | Admitting: *Deleted

## 2015-06-26 NOTE — Telephone Encounter (Addendum)
Post op courtesy call-I spoke with pt's husband, Delfino Lovett, he states pt is asleep.  I gave him instructions to give pt,to RICE, not to have the foot below her heart or dangle the foot mor ethan 15 mins/hour, to remain in the surgical boot at all times, and leave the original surgical dressing in place until the 1st post op visit.  I told him to call with concerns.  Richard states understanding.  06/30/2015-Pt left message states she is having "very bad" calf pain.  I called pt's two contact numbers and left a message for her to call again.  Pt's husband, Delfino Lovett returned my call and states he is taking pt to the emergency room, the leg is hot not in her calf, but below and above the ankle and painful.  Dr.

## 2015-06-30 ENCOUNTER — Emergency Department (HOSPITAL_COMMUNITY)
Admission: EM | Admit: 2015-06-30 | Discharge: 2015-07-01 | Disposition: A | Discharge status: Home / Self Care | Payer: BLUE CROSS/BLUE SHIELD | Attending: Emergency Medicine | Admitting: Emergency Medicine

## 2015-06-30 ENCOUNTER — Emergency Department (HOSPITAL_BASED_OUTPATIENT_CLINIC_OR_DEPARTMENT_OTHER)
Admit: 2015-06-30 | Discharge: 2015-06-30 | Disposition: A | Discharge status: Home / Self Care | Payer: BLUE CROSS/BLUE SHIELD | Attending: Emergency Medicine | Admitting: Emergency Medicine

## 2015-06-30 ENCOUNTER — Encounter (HOSPITAL_COMMUNITY): Payer: Self-pay | Admitting: *Deleted

## 2015-06-30 DIAGNOSIS — M79609 Pain in unspecified limb: Secondary | ICD-10-CM | POA: Diagnosis not present

## 2015-06-30 DIAGNOSIS — Z86018 Personal history of other benign neoplasm: Secondary | ICD-10-CM | POA: Insufficient documentation

## 2015-06-30 DIAGNOSIS — Z79899 Other long term (current) drug therapy: Secondary | ICD-10-CM | POA: Diagnosis not present

## 2015-06-30 DIAGNOSIS — Z8619 Personal history of other infectious and parasitic diseases: Secondary | ICD-10-CM | POA: Diagnosis not present

## 2015-06-30 DIAGNOSIS — Z7952 Long term (current) use of systemic steroids: Secondary | ICD-10-CM | POA: Insufficient documentation

## 2015-06-30 DIAGNOSIS — Z9889 Other specified postprocedural states: Secondary | ICD-10-CM | POA: Diagnosis not present

## 2015-06-30 DIAGNOSIS — Z8709 Personal history of other diseases of the respiratory system: Secondary | ICD-10-CM | POA: Diagnosis not present

## 2015-06-30 DIAGNOSIS — F419 Anxiety disorder, unspecified: Secondary | ICD-10-CM | POA: Diagnosis not present

## 2015-06-30 DIAGNOSIS — Z85828 Personal history of other malignant neoplasm of skin: Secondary | ICD-10-CM | POA: Diagnosis not present

## 2015-06-30 DIAGNOSIS — M069 Rheumatoid arthritis, unspecified: Secondary | ICD-10-CM | POA: Diagnosis not present

## 2015-06-30 DIAGNOSIS — M25572 Pain in left ankle and joints of left foot: Secondary | ICD-10-CM | POA: Diagnosis present

## 2015-06-30 DIAGNOSIS — Z87891 Personal history of nicotine dependence: Secondary | ICD-10-CM | POA: Diagnosis not present

## 2015-06-30 DIAGNOSIS — Z8719 Personal history of other diseases of the digestive system: Secondary | ICD-10-CM | POA: Diagnosis not present

## 2015-06-30 DIAGNOSIS — Z880 Allergy status to penicillin: Secondary | ICD-10-CM | POA: Diagnosis not present

## 2015-06-30 DIAGNOSIS — Z8701 Personal history of pneumonia (recurrent): Secondary | ICD-10-CM | POA: Insufficient documentation

## 2015-06-30 DIAGNOSIS — M797 Fibromyalgia: Secondary | ICD-10-CM | POA: Diagnosis not present

## 2015-06-30 DIAGNOSIS — G8918 Other acute postprocedural pain: Secondary | ICD-10-CM | POA: Diagnosis not present

## 2015-06-30 DIAGNOSIS — I10 Essential (primary) hypertension: Secondary | ICD-10-CM | POA: Diagnosis not present

## 2015-06-30 MED ORDER — ONDANSETRON 4 MG PO TBDP
4.0000 mg | ORAL_TABLET | Freq: Once | ORAL | Status: AC
  Administered 2015-06-30: 4 mg via ORAL

## 2015-06-30 MED ORDER — OXYCODONE-ACETAMINOPHEN 5-325 MG PO TABS
2.0000 | ORAL_TABLET | Freq: Once | ORAL | Status: AC
  Administered 2015-06-30: 2 via ORAL
  Filled 2015-06-30: qty 2

## 2015-06-30 MED ORDER — ONDANSETRON 4 MG PO TBDP
8.0000 mg | ORAL_TABLET | Freq: Once | ORAL | Status: AC
  Administered 2015-06-30: 8 mg via ORAL
  Filled 2015-06-30: qty 2

## 2015-06-30 MED ORDER — OXYCODONE-ACETAMINOPHEN 5-325 MG PO TABS
ORAL_TABLET | ORAL | Status: AC
  Administered 2015-06-30: 1 via ORAL
  Filled 2015-06-30: qty 1

## 2015-06-30 MED ORDER — ONDANSETRON 4 MG PO TBDP
ORAL_TABLET | ORAL | Status: AC
  Filled 2015-06-30: qty 1

## 2015-06-30 MED ORDER — OXYCODONE-ACETAMINOPHEN 5-325 MG PO TABS
1.0000 | ORAL_TABLET | ORAL | Status: DC | PRN
  Administered 2015-06-30: 1 via ORAL

## 2015-06-30 NOTE — Progress Notes (Signed)
VASCULAR LAB PRELIMINARY  PRELIMINARY  PRELIMINARY  PRELIMINARY  Left lower extremity venous duplex completed.    Preliminary report:  Left:  No evidence of DVT, superficial thrombosis, or Baker's cyst.  Cebert Dettmann, RVT 06/30/2015, 6:07 PM

## 2015-06-30 NOTE — ED Notes (Signed)
Pt reports having left foot surgery on wed, onset this am of severe pain to left calf. Pt appears anxious at triage.

## 2015-06-30 NOTE — ED Provider Notes (Signed)
CSN: [id number]     Arrival date & time 06/30/15  1642 History   First MD Initiated Contact with Patient 06/30/15 2225     Chief Complaint  Patient presents with  . Leg Pain  . Post-op Problem     (Consider location/radiation/quality/duration/timing/severity/associated sxs/prior Treatment) HPI Comments: Patient with recent podiatric surgery to left distal 2nd, 3rd, 4th metatarsals 5 days ago by Dr. Earleen Newport presents with pain that developed in the posterior ankle over the last couple of days. No swelling of the calf noted. No redness, however, the patient has not removed bandages. She wears an ACE wrap that includes the foot and ankle and a CAM walker boot. No fever. No pain that extends above the ankle. She was sent by her surgeon to rule out the possibility of blood clot.   Patient is a 60 y.o. female presenting with leg pain. The history is provided by the patient. No language interpreter was used.  Leg Pain Location:  Ankle Injury: no   Ankle location:  L ankle Associated symptoms: no fever     Past Medical History  Diagnosis Date  . ANGIOMA 10/03/2008    REMOVED FROM RIGHT LOWER LEG-BENIGN  . HYPERLIPIDEMIA 08/27/2008  . GERD 08/27/2008  . SEBORRHEIC KERATOSIS 08/27/2008  . Arthritis     RA  . Fibromyalgia   . Complication of anesthesia   . PONV (postoperative nausea and vomiting)   . History of nonmelanoma skin cancer   . Cancer (HCC)     BASAL CELL SKIN CANCER  . HYPERTENSION 08/27/2008    pt is currently on no meds   . Pneumonia     hx of   . Bronchitis     hx of   . Anxiety   . History of hiatal hernia     hx of has had surgically repaired  . History of blood transfusion     1995  . History of measles   . History of mumps    Past Surgical History  Procedure Laterality Date  . Cesarean section  1980    times 2; 1980 and 1982  . Cesarean section  1982  . Cholecystectomy  1997  . Abdominal hysterectomy  1995  . Foot surgery  2004, 2005, 2008    bilat secondary to  neuropathy   . Knee arthroscopy  2007, 2008, 2009  . Nasal sinus surgery  2013  . Breast ductal system excision  08/16/2011    Procedure: EXCISION DUCTAL SYSTEM BREAST;  Surgeon: Joyice Faster. Cornett, MD;  Location: [place];  Service: General;  Laterality: Left;  left breast duct excision  . Open surgical repair of gluteal tendon  01/17/2012    Procedure: OPEN SURGICAL REPAIR OF GLUTEAL TENDON;  Surgeon: Mauri Pole, MD;  Location: [facility];  Service: Orthopedics;  Laterality: Right;  . Joint replacement  10,12    lt total knee X2  . Laparoscopic gastric sleeve resection N/A 10/30/2013    Procedure: LAPAROSCOPIC GASTRIC SLEEVE RESECTION WITH HIATAL HERNIA REPAIR AND UPPER ENDOSCOPY;  Surgeon: Greer Pickerel, MD;  Location: [facility];  Service: General;  Laterality: N/A;  . Appendectomy    . Excision/release bursa hip Right 10/21/2014    Procedure: OPEN RIGHT HIP BURSECTOMY, EXOSECTOMY;  Surgeon: Paralee Cancel, MD;  Location: [facility];  Service: Orthopedics;  Laterality: Right;   Family History  Problem Relation Age of Onset  . Cancer Mother     breast  . Cancer Father  prostate  . Diabetes Other     grandparent  . Heart disease Other     parent  . Kidney disease Other     parent  . Cancer Other     parent  . Hypertension Other   . Hyperlipidemia Other    Social History  Substance Use Topics  . Smoking status: Former Smoker -- 0.25 packs/day for 4 years    Types: Cigarettes    Quit date: 02/23/1984  . Smokeless tobacco: Never Used  . Alcohol Use: No   OB History    No data available     Review of Systems  Constitutional: Negative for fever and chills.  Respiratory: Negative.  Negative for shortness of breath.   Musculoskeletal:       See HPI.  Skin: Negative.  Negative for color change.  Neurological: Negative.  Negative for numbness.      Allergies  Bee venom; Codeine sulfate; and Penicillins  Home Medications   Prior to Admission medications    Medication Sig Start Date End Date Taking? Authorizing Provider  acetaminophen (TYLENOL) 500 MG tablet Take 1,000 mg by mouth every 6 (six) hours as needed for pain.     Historical Provider, MD  Biotin 1000 MCG tablet Take 3,000 mcg by mouth daily.    Historical Provider, MD  calcium-vitamin D (OSCAL WITH D) 500-200 MG-UNIT per tablet Take 2 tablets by mouth 2 (two) times daily.    Historical Provider, MD  cyclobenzaprine (FLEXERIL) 10 MG tablet Take 1 tablet (10 mg total) by mouth 3 (three) times daily as needed for muscle spasms. 03/03/15   Eulas Post, MD  enoxaparin (LOVENOX) 40 MG/0.4ML injection Inject 0.4 mLs (40 mg total) into the skin once. To prevent DVT 05/30/15   Trula Slade, DPM  EPIPEN 2-PAK 0.3 MG/0.3ML SOAJ injection Inject 0.3 mLs (0.3 mg total) into the muscle once. 06/02/15   Eulas Post, MD  estradiol (ESTRACE) 1 MG tablet Take 1 tablet (1 mg total) by mouth daily. 01/10/15   Eulas Post, MD  etanercept (ENBREL) 50 MG/ML injection Inject 0.98 mLs (50 mg total) into the skin once a week. Resume end of next week Patient taking differently: Inject 50 mg into the skin once a week. Friday 11/01/13   Greer Pickerel, MD  folic acid (FOLVITE) 1 MG tablet Take 2 mg by mouth daily.     Historical Provider, MD  hydrocortisone (ANUSOL-HC) 2.5 % rectal cream Place 1 application rectally 2 (two) times daily. 12/24/14   Laurey Morale, MD  lidocaine [facility]) 3 % CREA cream Apply 1 application topically as needed (For Hemmorrhoids). 12/24/14   Laurey Morale, MD  methotrexate (50 MG/ML) 1 gm SOLR May resume previous dose at  end of next week Patient taking differently: Inject 50 mg into the skin once a week. Friday 11/01/13   Greer Pickerel, MD  oxyCODONE (OXY IR/ROXICODONE) 5 MG immediate release tablet Take 1-2 tablets (5-10 mg total) by mouth every 4 (four) hours as needed for severe pain. 10/22/14   Danae Orleans, PA-C  oxyCODONE-acetaminophen (PERCOCET/ROXICET) 5-325 MG tablet  Take 1 tablet by mouth every 6 (six) hours as needed. 04/17/15   Historical Provider, MD  Pediatric Multiple Vit-C-FA (FLINSTONES GUMMIES OMEGA-3 DHA PO) Take 4 tablets by mouth daily.    Historical Provider, MD  pramipexole (MIRAPEX) 0.125 MG tablet Take 3 tablets at bedtime 04/29/15   Eulas Post, MD  predniSONE (DELTASONE) 5 MG tablet Take 5 mg  by mouth daily with breakfast.    Historical Provider, MD  scopolamine (TRANSDERM-SCOP) 1 MG/3DAYS Place 1 patch (1.5 mg total) onto the skin every 3 (three) days. Behind ear 12/25/14   Eulas Post, MD  sertraline (ZOLOFT) 50 MG tablet TAKE ONE AND ONE- HALF  TABLET ONCE DAILY 05/30/15   Eulas Post, MD  vitamin B-12 (CYANOCOBALAMIN) 1000 MCG tablet Take 1,000 mcg by mouth daily.    Historical Provider, MD  zolpidem (AMBIEN) 10 MG tablet Take 1 tablet (10 mg total) by mouth at bedtime as needed for sleep. 03/06/15   Eulas Post, MD   BP 128/75 mmHg  Pulse 89  Temp(Src) 99.7 F (37.6 C) (Oral)  Resp 18  SpO2 100% Physical Exam  Constitutional: She is oriented to person, place, and time. She appears well-developed and well-nourished. No distress.  Cardiovascular: Intact distal pulses.   Musculoskeletal: She exhibits no edema.  Left ankle has no swelling, redness or warm. Left calf nontender. Post-operative bandages remain in place to distal foot. Theappear dry, non-bloody, without drainage. Bandages are left in place.   Neurological: She is alert and oriented to person, place, and time.  Skin: Skin is warm and dry. No erythema.    ED Course  Procedures (including critical care time) Labs Review Labs Reviewed - No data to display  Imaging Review No results found. I have personally reviewed and evaluated these images and lab results as part of my medical decision-making.   EKG Interpretation None      MDM   Final diagnoses:  None    1. Post op pain  11:00 - Doppler study performed on left leg and is negative for  DVT. There is no redness or swelling to cause concern for infection. Pain has been poorly controlled with Demerol at home. She has had Percocet in the past and tolerated it well. Will provide pain medication and re-evaluate.  12:00 - pain is improved. Suspect pain is pressure pain from compression of ACE and CAM walker. No clots on doppler. Pain is controlled better with percocet. She is felt appropriate for discharge home with scheduled follow up with Dr. Earleen Newport.     Charlann Lange, PA-C 06/30/15 2359

## 2015-07-01 MED ORDER — ONDANSETRON 4 MG PO TBDP: 4.0000 mg | ORAL_TABLET | Freq: Three times a day (TID) | ORAL | Status: DC | PRN

## 2015-07-01 MED ORDER — OXYCODONE-ACETAMINOPHEN 5-325 MG PO TABS: 1.0000 | ORAL_TABLET | ORAL | Status: DC | PRN

## 2015-07-01 NOTE — Discharge Instructions (Signed)
TAKE THE MEDICATIONS AS PRESCRIBED AND RETURN TO THE EMERGENCY DEPARTMENT WITH ANY NEW CONCERNS. KEEP YOUR SCHEDULED APPOINTMENT WITH YOUR PODIATRIST.

## 2015-07-04 ENCOUNTER — Ambulatory Visit (INDEPENDENT_AMBULATORY_CARE_PROVIDER_SITE_OTHER): Payer: BLUE CROSS/BLUE SHIELD | Admitting: Podiatry

## 2015-07-04 ENCOUNTER — Ambulatory Visit (INDEPENDENT_AMBULATORY_CARE_PROVIDER_SITE_OTHER): Payer: BLUE CROSS/BLUE SHIELD

## 2015-07-04 ENCOUNTER — Encounter: Payer: Self-pay | Admitting: Podiatry

## 2015-07-04 VITALS — BP 146/81 | HR 74 | Resp 18

## 2015-07-04 DIAGNOSIS — Z9889 Other specified postprocedural states: Secondary | ICD-10-CM

## 2015-07-04 DIAGNOSIS — M2012 Hallux valgus (acquired), left foot: Secondary | ICD-10-CM

## 2015-07-04 DIAGNOSIS — M216X2 Other acquired deformities of left foot: Secondary | ICD-10-CM

## 2015-07-04 DIAGNOSIS — M2042 Other hammer toe(s) (acquired), left foot: Secondary | ICD-10-CM

## 2015-07-04 MED ORDER — MEPERIDINE HCL 50 MG PO TABS: 50.0000 mg | ORAL_TABLET | ORAL | Status: DC | PRN

## 2015-07-04 MED ORDER — CLINDAMYCIN HCL 300 MG PO CAPS: 300.0000 mg | ORAL_CAPSULE | Freq: Three times a day (TID) | ORAL | Status: DC

## 2015-07-07 DIAGNOSIS — Z9889 Other specified postprocedural states: Secondary | ICD-10-CM | POA: Insufficient documentation

## 2015-07-07 NOTE — Progress Notes (Signed)
Patient ID: [name], female   DOB: 17-Jul-1955, 60 y.o.   MRN: [id number]  Subjective: [name] is a 60 y.o. is seen today in office s/p left foot metatarsal head resection and hammertoe repair with [employer]. She did have the emergency room early this week due to pain. She was advanced was just too tight causing some discomfort and she is feeling much better. She did have an ultrasound of her leg which was negative for DVT. She was given Percocet however this become nauseous and she has not been taking them. She has continue with the Demerol doing well. Denies any systemic complaints such as fevers, chills, nausea, vomiting. No calf pain, chest pain, shortness of breath.   Objective: General: No acute distress, AAOx3  DP/PT pulses palpable 2/4, CRT < 3 sec to all digits.  Protective sensation intact. Motor function intact.  Right foot: Incision is well coapted without any evidence of dehiscence and sutures intact. There erythema although minimal to the 2nd digit without any ascending cellulitis, fluctuance, crepitus, malodor, drainage/purulence. There is mild edema around the surgical site. There is mild pain along the surgical site.  No other areas of tenderness to bilateral lower extremities.  No other open lesions or pre-ulcerative lesions.  No pain with calf compression, swelling, warmth, erythema.   Assessment and Plan:  Status post left foot surgery, doing well with no complications   -Treatment options discussed including all alternatives, risks, and complications -X-rays were obtained and reviewed with the patient. Hardware intact. No evidence of acute fracture. -Antibiotic ointment was applied over the incisions followed by dry sterile dressing. Keep the dressing clean, dry, intact. -Continue antibiotics. Refilled clindamycin today due to the redness of the second toe all this is likely due to inflammation as opposed to infection -Continue nonweightbearing in CAM  boot. -Ice/elevation -Pain medication as needed. -Monitor for any clinical signs or symptoms of infection and DVT/PE and directed to call the office immediately should any occur or go to the ER. -Follow-up in 1 week for possible suture removal or sooner if any problems arise. In the meantime, encouraged to call the office with any questions, concerns, change in symptoms.   [name], DPM

## 2015-07-11 ENCOUNTER — Ambulatory Visit (INDEPENDENT_AMBULATORY_CARE_PROVIDER_SITE_OTHER): Payer: BLUE CROSS/BLUE SHIELD | Admitting: Podiatry

## 2015-07-11 DIAGNOSIS — Z9889 Other specified postprocedural states: Secondary | ICD-10-CM

## 2015-07-11 DIAGNOSIS — M2042 Other hammer toe(s) (acquired), left foot: Secondary | ICD-10-CM

## 2015-07-11 DIAGNOSIS — M2012 Hallux valgus (acquired), left foot: Secondary | ICD-10-CM

## 2015-07-14 ENCOUNTER — Other Ambulatory Visit: Payer: Self-pay | Admitting: Family Medicine

## 2015-07-14 ENCOUNTER — Encounter: Payer: Self-pay | Admitting: Family Medicine

## 2015-07-14 MED ORDER — ESTRADIOL 1 MG PO TABS: 1.0000 mg | ORAL_TABLET | Freq: Every day | ORAL | Status: DC

## 2015-07-14 NOTE — Progress Notes (Signed)
Patient ID: [name], female   DOB: [birth year], 60 y.o.   MRN: [id number]  Subjective: [name] is a 60 y.o. is seen today in office s/p left foot metatarsal head resection and hammertoe repair with [employer] [place]. She states that she is doing well she is not taking pain medicine and her pain has decreased. She has continue nonweightbearing in a cam boot. She presents today for suture removal. Denies any systemic complaints such as fevers, chills, nausea, vomiting. No calf pain, chest pain, shortness of breath.   Objective: General: No acute distress, AAOx3  DP/PT pulses palpable 2/4, CRT < 3 sec to all digits.  Protective sensation intact. Motor function intact.  Right foot: Incision is well coapted without any evidence of dehiscence and sutures intact. There decreased erythema a to the 2nd digit without any ascending cellulitis, fluctuance, crepitus, malodor, drainage/purulence. There is mild edema around the surgical site. There is mild pain along the surgical site. K-wires intact without any drainage.  No other areas of tenderness to bilateral lower extremities.  No other open lesions or pre-ulcerative lesions.  No pain with calf compression, swelling, warmth, erythema.   Assessment and Plan:  Status post left foot surgery, doing well with no complications   -Treatment options discussed including all alternatives, risks, and complications -Every other suture removed. Antibiotic ointment was applied over the incisions followed by dry sterile dressing. Keep the dressing clean, dry, intact. -Finish antibiotics  -Continue nonweightbearing in CAM boot. -Ice/elevation -Pain medication as needed. -Monitor for any clinical signs or symptoms of infection and DVT/PE and directed to call the office immediately should any occur or go to the ER. -Follow-up in 1 week for possible suture removal or sooner if any problems arise. In the meantime, encouraged to call the office with any  questions, concerns, change in symptoms.   [name], DPM

## 2015-07-18 ENCOUNTER — Ambulatory Visit (INDEPENDENT_AMBULATORY_CARE_PROVIDER_SITE_OTHER): Payer: BLUE CROSS/BLUE SHIELD | Admitting: Podiatry

## 2015-07-18 DIAGNOSIS — M2042 Other hammer toe(s) (acquired), left foot: Secondary | ICD-10-CM

## 2015-07-18 DIAGNOSIS — M797 Fibromyalgia: Secondary | ICD-10-CM | POA: Diagnosis not present

## 2015-07-18 DIAGNOSIS — M2012 Hallux valgus (acquired), left foot: Secondary | ICD-10-CM

## 2015-07-18 DIAGNOSIS — M542 Cervicalgia: Secondary | ICD-10-CM | POA: Diagnosis not present

## 2015-07-18 DIAGNOSIS — M25532 Pain in left wrist: Secondary | ICD-10-CM | POA: Diagnosis not present

## 2015-07-18 DIAGNOSIS — Z9889 Other specified postprocedural states: Secondary | ICD-10-CM | POA: Diagnosis not present

## 2015-07-18 DIAGNOSIS — M79632 Pain in left forearm: Secondary | ICD-10-CM | POA: Diagnosis not present

## 2015-07-18 DIAGNOSIS — M150 Primary generalized (osteo)arthritis: Secondary | ICD-10-CM | POA: Diagnosis not present

## 2015-07-18 DIAGNOSIS — M0589 Other rheumatoid arthritis with rheumatoid factor of multiple sites: Secondary | ICD-10-CM | POA: Diagnosis not present

## 2015-07-18 MED ORDER — PROMETHAZINE HCL 12.5 MG PO TABS: 12.5000 mg | ORAL_TABLET | Freq: Three times a day (TID) | ORAL | Status: DC | PRN

## 2015-07-22 NOTE — Progress Notes (Signed)
Patient ID: [name], female   DOB: 09/14/1955, 60 y.o.   MRN: [id number]  Subjective: [name] is a 60 y.o. is seen today in office s/p left foot metatarsal head resection and hammertoe repair with [employer] [place]. She presents today to remove the remainder the stitches. Denies any systemic complaints such as fevers, chills, nausea, vomiting. No calf pain, chest pain, shortness of breath.   Objective: General: No acute distress, AAOx3  DP/PT pulses palpable 2/4, CRT < 3 sec to all digits.  Protective sensation intact. Motor function intact.  Right foot: Incision is well coapted without any evidence of dehiscence and sutures intact. There no erythema to the 2nd digit without any ascending cellulitis, fluctuance, crepitus, malodor, drainage/purulence. Dried hemorraghic bulla to plantar 2nd digit. There is mild edema around the surgical site. There is mild pain along the surgical site. K-wires intact without any drainage.  No other areas of tenderness to bilateral lower extremities.  No other open lesions or pre-ulcerative lesions.  No pain with calf compression, swelling, warmth, erythema.   Assessment and Plan:  Status post left foot surgery, doing well with no complications   -Treatment options discussed including all alternatives, risks, and complications -Sutures were all removed. Antibiotic ointment was applied over the incisions followed by dry sterile dressing. Keep the dressing clean, dry, intact. -Continue nonweightbearing in CAM boot. -Ice/elevation -Pain medication as needed. -Monitor for any clinical signs or symptoms of infection and DVT/PE and directed to call the office immediately should any occur or go to the ER. -Follow-up in 2 weeks or sooner if any problems arise. In the meantime, encouraged to call the office with any questions, concerns, change in symptoms.   [name], DPM

## 2015-08-01 ENCOUNTER — Ambulatory Visit (INDEPENDENT_AMBULATORY_CARE_PROVIDER_SITE_OTHER): Payer: BLUE CROSS/BLUE SHIELD

## 2015-08-01 ENCOUNTER — Encounter: Payer: Self-pay | Admitting: Podiatry

## 2015-08-01 ENCOUNTER — Ambulatory Visit (INDEPENDENT_AMBULATORY_CARE_PROVIDER_SITE_OTHER): Payer: BLUE CROSS/BLUE SHIELD | Admitting: Podiatry

## 2015-08-01 DIAGNOSIS — M2012 Hallux valgus (acquired), left foot: Secondary | ICD-10-CM

## 2015-08-01 DIAGNOSIS — M2042 Other hammer toe(s) (acquired), left foot: Secondary | ICD-10-CM

## 2015-08-01 DIAGNOSIS — Z9889 Other specified postprocedural states: Secondary | ICD-10-CM

## 2015-08-01 MED ORDER — MEPERIDINE HCL 50 MG PO TABS: 50.0000 mg | ORAL_TABLET | ORAL | Status: DC | PRN

## 2015-08-04 NOTE — Progress Notes (Signed)
Patient ID: ARWILDA ZELMER, female   DOB: 1955/03/08, 60 y.o.   MRN: [id number]  Subjective: KENZIE EITZEN is a 60 y.o. is seen today in office s/p left foot metatarsal head resection and hammertoe repair with [employer] [place]. She states that she has been nonweightbearing as much as possible, cam boot however it is heavy. She started to get pain to her right foot due to compensation. Denies any systemic complaints such as fevers, chills, nausea, vomiting. No calf pain, chest pain, shortness of breath.   Objective: General: No acute distress, AAOx3  DP/PT pulses palpable 2/4, CRT < 3 sec to all digits.  Protective sensation intact. Motor function intact.  Right foot: Incision is well coapted without any evidence of dehiscence and a scar has formed over the incisions. There is decreased edema and skin lines are present. No erythema or increase in warmth. K wires are intact the digits  Without any redness or drainage. No other areas of tenderness to bilateral lower extremities.  No other open lesions or pre-ulcerative lesions.  No pain with calf compression, swelling, warmth, erythema.   Assessment and Plan:  Status post left foot surgery, doing well with no complications   -Treatment options discussed including all alternatives, risks, and complications -X-rays obtained and reviewed. K wires intact. Status post bunionectomy -Likely the wires in for 1 more week. I'll see her back next week to remove the wires. -Dispensed short boot today. -Pain medication as needed -Ice and elevation. -Monitor for any clinical signs or symptoms of infection and/or DVT/PE and directed to call the office immediately should any occur or go to the ER. -Follow-up in 1 week or sooner if any problems arise. In the meantime, encouraged to call the office with any questions, concerns, change in symptoms.   [name], DPM

## 2015-08-08 ENCOUNTER — Encounter: Payer: Self-pay | Admitting: Podiatry

## 2015-08-08 ENCOUNTER — Ambulatory Visit (INDEPENDENT_AMBULATORY_CARE_PROVIDER_SITE_OTHER): Payer: BLUE CROSS/BLUE SHIELD | Admitting: Podiatry

## 2015-08-08 ENCOUNTER — Ambulatory Visit (INDEPENDENT_AMBULATORY_CARE_PROVIDER_SITE_OTHER): Payer: BLUE CROSS/BLUE SHIELD

## 2015-08-08 DIAGNOSIS — M2012 Hallux valgus (acquired), left foot: Secondary | ICD-10-CM | POA: Diagnosis not present

## 2015-08-08 DIAGNOSIS — Z9889 Other specified postprocedural states: Secondary | ICD-10-CM | POA: Diagnosis not present

## 2015-08-08 NOTE — Progress Notes (Signed)
Subjective:     Patient ID: [name], female   DOB: Oct 12, 1955, 60 y.o.   MRN: [id number]  HPI this patient presents to the office following foot surgery for the correction of a bunion and prominent metatarsals on her left foot and hammertoes left foot.  Patient returns to the office and states that she is doing well but she has limited her activity since she does not want to aggravate the surgical sites. She says that she limits her walking and when she does she wears a Dispensing optician. She presents the office today for further evaluation and treatment of her surgically corrected foot   Review of Systems     Objective:   Physical Exam neurovascular status is intact. His prior to the surgery. The surgical sites are well coapted at this juncture. The dorsal incisions are covered by sterilel strips.  There is no evidence of any drainage or infection noted in the left foot. K wires appear to be intact to all the digits     Assessment:     S/p foot surgery     Plan:     ROV  This is a Dr. Jacqualyn Posey surgical patient and Dr. Jacqualyn Posey was caught in surgery. I examined this patient for him and had the Steri-Strips removed   and cleaned the dorsal aspect of the left foot. I chose to leave the K wires in the foot and asked her to return this coming Monday for an appointment with Dr. Jacqualyn Posey for removal of the K wires per his decision. I told her to keep ambulating in the cam walker and limit her activity over the weekend. She is to return to the office on Monday for evaluation of her surgical foot. X-rays were reviewed and found the bone to be  good position and the K wires were in good position extending from the toe through the metatarsals .Surgical shoe was dispensed for this patient to use in future.    Gardiner Barefoot DPM

## 2015-08-11 ENCOUNTER — Ambulatory Visit (INDEPENDENT_AMBULATORY_CARE_PROVIDER_SITE_OTHER): Payer: BLUE CROSS/BLUE SHIELD | Admitting: Podiatry

## 2015-08-11 ENCOUNTER — Encounter: Payer: Self-pay | Admitting: Podiatry

## 2015-08-11 DIAGNOSIS — M2012 Hallux valgus (acquired), left foot: Secondary | ICD-10-CM

## 2015-08-11 DIAGNOSIS — M2042 Other hammer toe(s) (acquired), left foot: Secondary | ICD-10-CM

## 2015-08-11 DIAGNOSIS — M216X2 Other acquired deformities of left foot: Secondary | ICD-10-CM

## 2015-08-11 DIAGNOSIS — Z9889 Other specified postprocedural states: Secondary | ICD-10-CM

## 2015-08-11 NOTE — Progress Notes (Signed)
Patient ID: [name], female   DOB: 05/16/1955, 60 y.o.   MRN: [id number]  Subjective: JANALEE PROFFER is a 60 y.o. is seen today in office s/p left foot metatarsal head resection and hammertoe repair with [employer] [place]. She presents today for pin removal. She states that she has been nonweightbearing as much as possible in the CAM boot. Denies any systemic complaints such as fevers, chills, nausea, vomiting. No calf pain, chest pain, shortness of breath.   Objective: General: No acute distress, AAOx3  DP/PT pulses palpable 2/4, CRT < 3 sec to all digits.  Protective sensation intact. Motor function intact.  Right foot: Incision is well coapted without any evidence of dehiscence and a scar has formed over the incisions. There is decreased edema and skin lines are present. No erythema or increase in warmth. K wires are intact the digits without any redness or drainage. Toes in rectus position.  No other areas of tenderness to bilateral lower extremities.  No other open lesions or pre-ulcerative lesions.  No pain with calf compression, swelling, warmth, erythema.   Assessment and Plan:  Status post left foot surgery, doing well with no complications   -Treatment options discussed including all alternatives, risks, and complications -Wires removed today in total without complications. Antibiotic ointment was applied. Continue antibiotic ointment overlying the pin hole sites. She can start to shower once the sites closed. Cocoa butter/vitamin E cream over the incisions daily. She started transition to weightbearing as tolerated in the cam boot and that she's be a gradual transition. One she can ambulate comfortably in a cam boot she inserted transition to a regular shoe. -Follow-up as scheduled or sooner if any problems arise. In the meantime, encouraged to call the office with any questions, concerns, change in symptoms.   [name], DPM

## 2015-09-01 ENCOUNTER — Ambulatory Visit (INDEPENDENT_AMBULATORY_CARE_PROVIDER_SITE_OTHER): Payer: BLUE CROSS/BLUE SHIELD | Admitting: Family Medicine

## 2015-09-01 VITALS — BP 140/100 | HR 100 | Temp 98.0°F | Ht 64.0 in | Wt 202.0 lb

## 2015-09-01 DIAGNOSIS — R300 Dysuria: Secondary | ICD-10-CM | POA: Diagnosis not present

## 2015-09-01 DIAGNOSIS — R030 Elevated blood-pressure reading, without diagnosis of hypertension: Secondary | ICD-10-CM

## 2015-09-01 DIAGNOSIS — G2581 Restless legs syndrome: Secondary | ICD-10-CM | POA: Diagnosis not present

## 2015-09-01 DIAGNOSIS — IMO0001 Reserved for inherently not codable concepts without codable children: Secondary | ICD-10-CM

## 2015-09-01 LAB — POCT URINALYSIS DIPSTICK
Bilirubin, UA: NEGATIVE
Glucose, UA: NEGATIVE
Ketones, UA: 1.025
Leukocytes, UA: NEGATIVE
Nitrite, UA: NEGATIVE
Protein, UA: NEGATIVE
Spec Grav, UA: 1.025
Urobilinogen, UA: 0.2
pH, UA: 5.5

## 2015-09-01 NOTE — Progress Notes (Signed)
Pre visit review using our clinic review tool, if applicable. No additional management support is needed unless otherwise documented below in the visit note. 

## 2015-09-01 NOTE — Progress Notes (Signed)
Subjective:    Patient ID: [name], female    DOB: [birth year], 60 y.o.   MRN: [id number]  HPI Medical follow-up Patient had laparoscopic sleeve gastrectomy couple years ago. Recent weight loss has been curtailed because of some recent left foot surgery. She hopes to get her walking boot off soon and start with more formal exercise.  Restless legs. Controlled with Mirapex. Tries to avoid caffeine.  Three-day history burning with urination. No fevers or chills. No flank pain. No nausea or vomiting. She has history of chronic hematuria microscopically which has been worked up by urology previously and unrevealing. No gross hematuria.  She has rheumatoid arthritis followed by rheumatology. She's getting regular lab work through them Past history of GERD no symptoms improved following her gastric bypass. She has past history of hypertension but has been stable off medication. Does not monitor blood pressure regularly.  Past Medical History  Diagnosis Date  . ANGIOMA 10/03/2008    REMOVED FROM RIGHT LOWER LEG-BENIGN  . HYPERLIPIDEMIA 08/27/2008  . GERD 08/27/2008  . SEBORRHEIC KERATOSIS 08/27/2008  . Arthritis     RA  . Fibromyalgia   . Complication of anesthesia   . PONV (postoperative nausea and vomiting)   . History of nonmelanoma skin cancer   . Cancer (HCC)     BASAL CELL SKIN CANCER  . HYPERTENSION 08/27/2008    pt is currently on no meds   . Pneumonia     hx of   . Bronchitis     hx of   . Anxiety   . History of hiatal hernia     hx of has had surgically repaired  . History of blood transfusion     1995  . History of measles   . History of mumps    Past Surgical History  Procedure Laterality Date  . Cesarean section  1980    times 2; 1980 and 1982  . Cesarean section  1982  . Cholecystectomy  1997  . Abdominal hysterectomy  1995  . Foot surgery  2004, 2005, 2008    bilat secondary to neuropathy   . Knee arthroscopy  2007, 2008, 2009  . Nasal sinus surgery   2013  . Breast ductal system excision  08/16/2011    Procedure: EXCISION DUCTAL SYSTEM BREAST;  Surgeon: Joyice Faster. Cornett, MD;  Location: [place];  Service: General;  Laterality: Left;  left breast duct excision  . Open surgical repair of gluteal tendon  01/17/2012    Procedure: OPEN SURGICAL REPAIR OF GLUTEAL TENDON;  Surgeon: Mauri Pole, MD;  Location: [facility];  Service: Orthopedics;  Laterality: Right;  . Joint replacement  10,12    lt total knee X2  . Laparoscopic gastric sleeve resection N/A 10/30/2013    Procedure: LAPAROSCOPIC GASTRIC SLEEVE RESECTION WITH HIATAL HERNIA REPAIR AND UPPER ENDOSCOPY;  Surgeon: Greer Pickerel, MD;  Location: [facility];  Service: General;  Laterality: N/A;  . Appendectomy    . Excision/release bursa hip Right 10/21/2014    Procedure: OPEN RIGHT HIP BURSECTOMY, EXOSECTOMY;  Surgeon: Paralee Cancel, MD;  Location: [facility];  Service: Orthopedics;  Laterality: Right;    reports that she quit smoking about 31 years ago. Her smoking use included Cigarettes. She has a 1 pack-year smoking history. She has never used smokeless tobacco. She reports that she does not drink alcohol or use illicit drugs. family history includes Cancer in her father, mother, and other; Diabetes in her other; Heart disease  in her other; Hyperlipidemia in her other; Hypertension in her other; Kidney disease in her other. Allergies  Allergen Reactions  . Bee Venom Anaphylaxis  . Codeine Sulfate Other (See Comments)    GI upset  . Penicillins Rash       Review of Systems  Constitutional: Negative for fatigue.  Eyes: Negative for visual disturbance.  Respiratory: Negative for cough, chest tightness, shortness of breath and wheezing.   Cardiovascular: Negative for chest pain, palpitations and leg swelling.  Gastrointestinal: Negative for abdominal pain.  Endocrine: Negative for polydipsia and polyuria.  Genitourinary: Positive for dysuria. Negative for hematuria.    Neurological: Negative for dizziness, seizures, syncope, weakness, light-headedness and headaches.       Objective:   Physical Exam  Constitutional: She appears well-developed and well-nourished.  Neck: Neck supple. No thyromegaly present.  Cardiovascular: Normal rate and regular rhythm.   Pulmonary/Chest: Effort normal and breath sounds normal. No respiratory distress. She has no wheezes. She has no rales.  Musculoskeletal: She exhibits no edema.  Lymphadenopathy:    She has no cervical adenopathy.          Assessment & Plan:  #1 restless leg symptoms. Stable on Mirapex. Continue current medication. Continue to avoid caffeine -especially late in the day  #2 history of hypertension. Initial blood pressure reading today 140/100. Repeat after rest 140/78. She is encouraged continued weight loss and engage in more consistent exercise  #3 dysuria. Urine dipstick reveals blood but otherwise negative. No leukocytes. She has chronic hematuria on dipstick and has been worked up previously as above. Patient reassured no signs of infection.  Eulas Post MD [facility] at [facility]

## 2015-09-04 ENCOUNTER — Other Ambulatory Visit: Payer: Self-pay | Admitting: Family Medicine

## 2015-09-05 ENCOUNTER — Encounter: Payer: Self-pay | Admitting: Podiatry

## 2015-09-05 ENCOUNTER — Ambulatory Visit (INDEPENDENT_AMBULATORY_CARE_PROVIDER_SITE_OTHER): Payer: BLUE CROSS/BLUE SHIELD | Admitting: Podiatry

## 2015-09-05 ENCOUNTER — Ambulatory Visit (INDEPENDENT_AMBULATORY_CARE_PROVIDER_SITE_OTHER): Payer: BLUE CROSS/BLUE SHIELD

## 2015-09-05 VITALS — BP 143/76 | HR 71 | Resp 16

## 2015-09-05 DIAGNOSIS — M2012 Hallux valgus (acquired), left foot: Secondary | ICD-10-CM

## 2015-09-05 DIAGNOSIS — Z9889 Other specified postprocedural states: Secondary | ICD-10-CM

## 2015-09-05 DIAGNOSIS — M2042 Other hammer toe(s) (acquired), left foot: Secondary | ICD-10-CM

## 2015-09-05 MED ORDER — NONFORMULARY OR COMPOUNDED ITEM: Status: DC

## 2015-09-09 ENCOUNTER — Other Ambulatory Visit: Payer: Self-pay | Admitting: Family Medicine

## 2015-09-09 DIAGNOSIS — Z139 Encounter for screening, unspecified: Secondary | ICD-10-CM

## 2015-09-10 ENCOUNTER — Other Ambulatory Visit: Payer: Self-pay | Admitting: Family Medicine

## 2015-09-10 NOTE — Telephone Encounter (Signed)
Last refill was 03/06/2015 #90 with 3 rf---by a CMA that is no longer in this office.  Refills expire after 6 months so she would be due 09/03/2015. Last seen in the office 09/01/2015

## 2015-09-10 NOTE — Telephone Encounter (Signed)
This medication is only filled for 6 months at a time.  Last filled for 1 year.  Refills on file may have expired.

## 2015-09-10 NOTE — ED Provider Notes (Signed)
Medical screening examination/treatment/procedure(s) were performed by non-physician practitioner and as supervising physician I was immediately available for consultation/collaboration.   EKG Interpretation None       Isla Pence, MD 09/10/15 [phone]

## 2015-09-11 MED ORDER — ZOLPIDEM TARTRATE 10 MG PO TABS: 10.0000 mg | ORAL_TABLET | Freq: Every evening | ORAL | Status: DC | PRN

## 2015-09-11 NOTE — Telephone Encounter (Signed)
Refill #90 with one refill.  

## 2015-09-12 ENCOUNTER — Ambulatory Visit (INDEPENDENT_AMBULATORY_CARE_PROVIDER_SITE_OTHER): Payer: BLUE CROSS/BLUE SHIELD | Admitting: Family Medicine

## 2015-09-12 VITALS — BP 128/80 | HR 102 | Temp 98.0°F | Ht 64.0 in | Wt 196.0 lb

## 2015-09-12 DIAGNOSIS — R51 Headache: Secondary | ICD-10-CM

## 2015-09-12 DIAGNOSIS — R519 Headache, unspecified: Secondary | ICD-10-CM

## 2015-09-12 MED ORDER — LEVOFLOXACIN 750 MG PO TABS: 750.0000 mg | ORAL_TABLET | Freq: Every day | ORAL | Status: DC

## 2015-09-12 MED ORDER — PROMETHAZINE HCL 25 MG PO TABS: 25.0000 mg | ORAL_TABLET | Freq: Four times a day (QID) | ORAL | Status: DC | PRN

## 2015-09-12 NOTE — Progress Notes (Signed)
Pre visit review using our clinic review tool, if applicable. No additional management support is needed unless otherwise documented below in the visit note. 

## 2015-09-12 NOTE — Progress Notes (Signed)
Subjective:     Patient ID: [name], female   DOB: May 25, 1955, 60 y.o.   MRN: [id number]  HPI  Patient seen with somewhat diffuse severe headache.  Onset this past Monday.  She's had some slight nausea but no vomiting.  Headache is described as dull and occipital and spreads frontally.  Current headache is 6 out of 10 severity.  Tylenol with no relief.  Some nasal congestion. No stiff neck.  No headache injury. No fevers or chills.  Somewhat similar headache in the past and she had complicated sphenoid     sinusitis back in 2012 and 2013. Eventually had sinus surgery.  Past Medical History  Diagnosis Date  . ANGIOMA 10/03/2008    REMOVED FROM RIGHT LOWER LEG-BENIGN  . HYPERLIPIDEMIA 08/27/2008  . GERD 08/27/2008  . SEBORRHEIC KERATOSIS 08/27/2008  . Arthritis     RA  . Fibromyalgia   . Complication of anesthesia   . PONV (postoperative nausea and vomiting)   . History of nonmelanoma skin cancer   . Cancer (HCC)     BASAL CELL SKIN CANCER  . HYPERTENSION 08/27/2008    pt is currently on no meds   . Pneumonia     hx of   . Bronchitis     hx of   . Anxiety   . History of hiatal hernia     hx of has had surgically repaired  . History of blood transfusion     1995  . History of measles   . History of mumps    Past Surgical History  Procedure Laterality Date  . Cesarean section  1980    times 2; 1980 and 1982  . Cesarean section  1982  . Cholecystectomy  1997  . Abdominal hysterectomy  1995  . Foot surgery  2004, 2005, 2008    bilat secondary to neuropathy   . Knee arthroscopy  2007, 2008, 2009  . Nasal sinus surgery  2013  . Breast ductal system excision  08/16/2011    Procedure: EXCISION DUCTAL SYSTEM BREAST;  Surgeon: Joyice Faster. Cornett, MD;  Location: [place];  Service: General;  Laterality: Left;  left breast duct excision  . Open surgical repair of gluteal tendon  01/17/2012    Procedure: OPEN SURGICAL REPAIR OF GLUTEAL TENDON;  Surgeon: Mauri Pole, MD;  Location: [facility];  Service: Orthopedics;  Laterality: Right;  . Joint replacement  10,12    lt total knee X2  . Laparoscopic gastric sleeve resection N/A 10/30/2013    Procedure: LAPAROSCOPIC GASTRIC SLEEVE RESECTION WITH HIATAL HERNIA REPAIR AND UPPER ENDOSCOPY;  Surgeon: Greer Pickerel, MD;  Location: [facility];  Service: General;  Laterality: N/A;  . Appendectomy    . Excision/release bursa hip Right 10/21/2014    Procedure: OPEN RIGHT HIP BURSECTOMY, EXOSECTOMY;  Surgeon: Paralee Cancel, MD;  Location: [facility];  Service: Orthopedics;  Laterality: Right;    reports that she quit smoking about 31 years ago. Her smoking use included Cigarettes. She has a 1 pack-year smoking history. She has never used smokeless tobacco. She reports that she does not drink alcohol or use illicit drugs. family history includes Cancer in her father, mother, and other; Diabetes in her other; Heart disease in her other; Hyperlipidemia in her other; Hypertension in her other; Kidney disease in her other. Allergies  Allergen Reactions  . Bee Venom Anaphylaxis  . Codeine Sulfate Other (See Comments)    GI upset  . Penicillins Rash  Review of Systems  Constitutional: Negative for fever and chills.  Respiratory: Negative for cough.   Cardiovascular: Negative for chest pain.  Gastrointestinal: Positive for nausea. Negative for vomiting.  Neurological: Positive for headaches. Negative for dizziness.  Psychiatric/Behavioral: Negative for confusion.       Objective:   Physical Exam  Constitutional: She is oriented to person, place, and time. She appears well-developed and well-nourished.  HENT:  Right Ear: External ear normal.  Left Ear: External ear normal.  Mouth/Throat: Oropharynx is clear and moist.  Neck: Neck supple.  Cardiovascular: Normal rate and regular rhythm.   Pulmonary/Chest: Effort normal and breath sounds normal.  Lymphadenopathy:    She has no cervical adenopathy.  Neurological: She  is alert and oriented to person, place, and time. No cranial nerve deficit.       Assessment:     Headache. Etiology unclear. May represent more of tension-type headache but she had similar type symptoms in the past with sphenoid sinusitis    Plan:     - start Levaquin  750 mg once daily for 10 days - Phenergan 25 mg every 6 hours as needed for nausea and vomiting - follow-up promptly for any fever or if headache not  Improving by next week - May need follow-up CT maxillofacial to  reassess  Eulas Post MD [facility] at [facility]

## 2015-09-12 NOTE — Patient Instructions (Signed)
Touch base if headache not improved by next week.

## 2015-09-15 NOTE — Progress Notes (Signed)
Patient ID: ILINE MOUSER, female   DOB: December 16, 1955, 60 y.o.   MRN: [id number]  Subjective: [name] is a 60 y.o. is seen today in office s/p left foot metatarsal head resection and hammertoe repair with Altamese Mylo. She states that she is doing well she's not having any pain. She states this one has greatly improved. Denies any redness or warmth. Denies any systemic complaints such as fevers, chills, nausea, vomiting. No calf pain, chest pain, shortness of breath.   Objective: General: No acute distress, AAOx3  DP/PT pulses palpable 2/4, CRT < 3 sec to all digits.  Protective sensation intact. Motor function intact.  Right foot: Incision is well coapted without any evidence of dehiscence and a scar has formed over the incisions. There is minimal edema and skin lines are present. No erythema or increase in warmth.ptosis in rectus position. There is no tenderness to palpation of the surgical sites. No other areas of tenderness to bilateral lower extremities.  No other open lesions or pre-ulcerative lesions.  No pain with calf compression, swelling, warmth, erythema.   Assessment and Plan:  Status post left foot surgery, doing well with no complications   -Treatment options discussed including all alternatives, risks, and complications -X-rays obtained and reviewed. No evidence of acute fracture. -At this time she inserted transition to regular shoe as tolerated. Discussed that should be a gradual transition. Continue compression sock to help with swelling. Ice and elevation. -Monitor for signs or symptoms of infection to the ER should any occur. -Follow up as scheduled or sooner if any issues are to arise. Encouraged to call any questions, concerns or any changes in symptoms.  [name], DPM

## 2015-09-16 ENCOUNTER — Encounter: Payer: Self-pay | Admitting: Family Medicine

## 2015-09-16 ENCOUNTER — Telehealth: Payer: Self-pay | Admitting: Family Medicine

## 2015-09-16 NOTE — Telephone Encounter (Signed)
Suggest if she has no fever give Levaquin one more week and if not better by then go ahead with CT maxillofacial.

## 2015-09-18 ENCOUNTER — Ambulatory Visit
Admission: RE | Admit: 2015-09-18 | Discharge: 2015-09-18 | Disposition: A | Discharge status: Home / Self Care | Payer: BLUE CROSS/BLUE SHIELD | Source: Ambulatory Visit | Attending: Family Medicine | Admitting: Family Medicine

## 2015-09-18 DIAGNOSIS — Z139 Encounter for screening, unspecified: Secondary | ICD-10-CM

## 2015-09-26 ENCOUNTER — Other Ambulatory Visit: Payer: Self-pay | Admitting: Orthopedic Surgery

## 2015-09-26 DIAGNOSIS — M1711 Unilateral primary osteoarthritis, right knee: Secondary | ICD-10-CM

## 2015-10-06 ENCOUNTER — Ambulatory Visit
Admission: RE | Admit: 2015-10-06 | Discharge: 2015-10-06 | Disposition: A | Discharge status: Home / Self Care | Payer: BLUE CROSS/BLUE SHIELD | Source: Ambulatory Visit | Attending: Orthopedic Surgery | Admitting: Orthopedic Surgery

## 2015-10-06 DIAGNOSIS — M1711 Unilateral primary osteoarthritis, right knee: Secondary | ICD-10-CM

## 2015-10-10 ENCOUNTER — Encounter: Payer: Self-pay | Admitting: Podiatry

## 2015-10-10 ENCOUNTER — Other Ambulatory Visit: Payer: Self-pay | Admitting: Podiatry

## 2015-10-10 ENCOUNTER — Ambulatory Visit (INDEPENDENT_AMBULATORY_CARE_PROVIDER_SITE_OTHER): Payer: BLUE CROSS/BLUE SHIELD | Admitting: Podiatry

## 2015-10-10 ENCOUNTER — Ambulatory Visit (INDEPENDENT_AMBULATORY_CARE_PROVIDER_SITE_OTHER): Payer: BLUE CROSS/BLUE SHIELD

## 2015-10-10 VITALS — BP 129/71 | HR 73 | Resp 14

## 2015-10-10 DIAGNOSIS — M76821 Posterior tibial tendinitis, right leg: Secondary | ICD-10-CM

## 2015-10-10 DIAGNOSIS — M779 Enthesopathy, unspecified: Secondary | ICD-10-CM

## 2015-10-10 DIAGNOSIS — G8918 Other acute postprocedural pain: Secondary | ICD-10-CM

## 2015-10-16 NOTE — Progress Notes (Signed)
DOS 06/25/2015 Left foot surgical correction of bunion, metatarsal head resection (removal of ball of foot) 2,3,4. Hammer toe repair 2,3,4 left.

## 2015-10-20 NOTE — Progress Notes (Signed)
Subjective: 60 year old female presents the office today for follow-up evaluation status post left foot surgery. She states that she's had much improvement symptoms compared to last appointment. She points the first interspace where she still has some discomfort. She is also starting pain in the right side she points on the medial aspect of the ankle this been ongoing for about 1 week. She denies recent injury or trauma. No swelling or redness. She proceeded undergo what appears to be posterior tibial tendon repair several years ago. Denies any systemic complaints such as fevers, chills, nausea, vomiting. No acute changes since last appointment, and no other complaints at this time.   Objective: AAO x3, NAD DP/PT pulses palpable bilaterally, CRT less than 3 seconds Incisions are all well coapted without any evidence of dehiscence and scars have formed. The toes are rectus position. There is mild discomfort on the first interspace and there is localized edema to this area any erythema or increase in warmth. There is no other areas of tenderness left foot. On the right foot there is tenderness along the scar from the prior incision on the posterior tibial tendon/flexor tendons. Hence. Intact. There is no overlying edema, erythema, increase in warmth. No edema, erythema, increase in warmth to bilateral lower extremities.  No open lesions or pre-ulcerative lesions.  No pain with calf compression, swelling, warmth, erythema  Assessment: Left foot tendinitis, right posterior tibial tendinitis  Plan: -All treatment options discussed with the patient including all alternatives, risks, complications.  -X-rays obtained and reviewed the patient. No evidence of acute fracture. Toes rectus position on the left side. -At this point discussed a steroid injection left first interspace given the localized edema to this area as well as pain. She understands the risks and complications and wishes to proceed. Under  sterile conditions a mixture of Kenalog and local anesthetic infiltrated without couple complications. Post injection care was discussed.  -Continue with supportive shoe gear. Continue compression sock obtained and left side. -On the right side ankle brace as needed which she already has. Range of motion, rehabilitation exercises. -Follow-up as scheduled or sooner if any issues are to arise. Call any questions or concerns meantime. -Patient encouraged to call the office with any questions, concerns, change in symptoms.   [name], DPM

## 2015-11-07 ENCOUNTER — Ambulatory Visit (INDEPENDENT_AMBULATORY_CARE_PROVIDER_SITE_OTHER): Payer: BLUE CROSS/BLUE SHIELD

## 2015-11-07 ENCOUNTER — Other Ambulatory Visit: Payer: Self-pay | Admitting: Podiatry

## 2015-11-07 ENCOUNTER — Encounter: Payer: Self-pay | Admitting: Podiatry

## 2015-11-07 ENCOUNTER — Ambulatory Visit (INDEPENDENT_AMBULATORY_CARE_PROVIDER_SITE_OTHER): Payer: BLUE CROSS/BLUE SHIELD | Admitting: Podiatry

## 2015-11-07 DIAGNOSIS — Z09 Encounter for follow-up examination after completed treatment for conditions other than malignant neoplasm: Secondary | ICD-10-CM

## 2015-11-07 DIAGNOSIS — T148 Other injury of unspecified body region: Secondary | ICD-10-CM | POA: Diagnosis not present

## 2015-11-07 DIAGNOSIS — M216X2 Other acquired deformities of left foot: Secondary | ICD-10-CM

## 2015-11-07 DIAGNOSIS — M779 Enthesopathy, unspecified: Secondary | ICD-10-CM | POA: Diagnosis not present

## 2015-11-07 DIAGNOSIS — T148XXA Other injury of unspecified body region, initial encounter: Secondary | ICD-10-CM

## 2015-11-10 ENCOUNTER — Other Ambulatory Visit: Payer: Self-pay

## 2015-11-10 DIAGNOSIS — M779 Enthesopathy, unspecified: Secondary | ICD-10-CM

## 2015-11-10 DIAGNOSIS — M76821 Posterior tibial tendinitis, right leg: Secondary | ICD-10-CM

## 2015-11-10 NOTE — Progress Notes (Signed)
Ordered MRI 

## 2015-11-13 NOTE — Progress Notes (Signed)
Subjective: 60 year old female presents the office today for follow-up evaluation of left foot pain as well as right ankle pain. On the right ankle she states that she is getting pain is she points on the medial ankle and she feels that something is popping out of place. She presented to have surgery for the posterior tibial tendon several years ago. On the left foot she states that with the surgeries that she is doing well but she is having pain on the outside aspect of her foot and she points to submetatarsal 5 and lateral fifth metatarsal head where she gets the majority of symptoms. She did not get much relief after last steroid injection. She has not been wearing her orthotics. Denies any systemic complaints such as fevers, chills, nausea, vomiting. No acute changes since last appointment, and no other complaints at this time.   Objective: AAO x3, NAD; presents wearing spery topsiders  DP/PT pulses palpable bilaterally, CRT less than 3 seconds On the right ankle there is tenderness palpation on the medial aspect along the course of the flexor tendons and posterior tibial tendon. Tenderness is overlying the scar from the prior surgery. There is no gross edema or erythema to the area and the tendons appear to be sitting in good position however subjectively she has pain to this area when she walks she feels is coming the snapping. On the left foot tenderness continues along submetatarsal 5. There is no other areas of tenderness. No edema, erythema, increase in warmth to bilateral lower extremities.  No open lesions or pre-ulcerative lesions.  No pain with calf compression, swelling, warmth, erythema  Assessment: Right posterior tibial tendinitis, rule out tear; left foot pain, prominence the fifth metatarsal head remnant  Plan: -All treatment options discussed with the patient including all alternatives, risks, complications.  -On the right side at this time recommended MRI given her continued  symptoms as well as previous surgery. This is ordered today. -On the left side I recommended her to wear orthotics. She brought them in today and I modified them to offload the fifth metatarsal head. Discussed the break in instructions the inserts that she has not been wearing them. -Follow-up after MRI or sooner if needed. -Patient encouraged to call the office with any questions, concerns, change in symptoms.   [name], DPM

## 2015-11-14 ENCOUNTER — Ambulatory Visit
Admission: RE | Admit: 2015-11-14 | Discharge: 2015-11-14 | Disposition: A | Discharge status: Home / Self Care | Payer: BLUE CROSS/BLUE SHIELD | Source: Ambulatory Visit | Attending: Podiatry | Admitting: Podiatry

## 2015-11-14 DIAGNOSIS — M779 Enthesopathy, unspecified: Secondary | ICD-10-CM

## 2015-11-14 DIAGNOSIS — M76821 Posterior tibial tendinitis, right leg: Secondary | ICD-10-CM

## 2015-11-19 ENCOUNTER — Ambulatory Visit (INDEPENDENT_AMBULATORY_CARE_PROVIDER_SITE_OTHER): Payer: BLUE CROSS/BLUE SHIELD | Admitting: *Deleted

## 2015-11-19 DIAGNOSIS — Z23 Encounter for immunization: Secondary | ICD-10-CM

## 2015-11-21 ENCOUNTER — Ambulatory Visit (INDEPENDENT_AMBULATORY_CARE_PROVIDER_SITE_OTHER): Payer: BLUE CROSS/BLUE SHIELD | Admitting: Podiatry

## 2015-11-21 DIAGNOSIS — T148XXA Other injury of unspecified body region, initial encounter: Secondary | ICD-10-CM

## 2015-11-21 DIAGNOSIS — T148 Other injury of unspecified body region: Secondary | ICD-10-CM | POA: Diagnosis not present

## 2015-11-21 NOTE — Progress Notes (Signed)
Subjective: 60 year old female presents the office today for follow-up evaluation of right ankle pain discussed MRI results. She said that she's continued pain in the right ankle she points on the inside aspect of her ankle on the scar from prior surgery. She is also getting pain the other aspect of her ankle as well as the right side. She stated left side is doing much better. Denies any recent injury or trauma. The right ankle does swell throughout the day. She has internal an ankle bracelet any relief. She also try the inserts as well as shoe changes without any relief. No other complaints.  Objective: AAO x3, NAD; presents wearing spery topsiders  DP/PT pulses palpable bilaterally, CRT less than 3 seconds On the right ankle there is tenderness palpation on the medial aspect along the course of the flexor tendons and posterior tibial tendon. Tenderness is overlying the scar from the prior surgery. There is trace edema to this area any erythema or increase in warmth. There is mild tenderness along the course the ATFL on the right ankle and there is mild edema to this area without any erythema. There is no gross ankle instability present. There is tenderness on both of these areas. At this time there is no tenderness in the left foot except for along the lateral aspect of the fifth metatarsal remnant. No edema. No open lesions or pre-ulcerative lesions.  No pain with calf compression, swelling, warmth, erythema  Assessment: Right posterior tibial tendinitis/tear and attenuation of lateral ankle ligament  Plan: -All treatment options discussed with the patient including all alternatives, risks, complications.  -MRI results were discussed the patient. Discussed both conservative and surgical treatment options. He has no spasm is appointment. After long discussion she elected to proceed with surgical invention. Discussed this is not a guarantee of resolution of symptoms and she understands this.  Discussed with her right posterior tibial tendon repair, peroneal tendon debridement and repair as well as ATFL repair. She wishes to proceed. -The incision placement as well as the postoperative course was discussed with the patient. I discussed risks of the surgery which include, but not limited to, infection, bleeding, pain, swelling, need for further surgery, delayed or nonhealing, painful or ugly scar, numbness or sensation changes, over/under correction, recurrence, transfer lesions, further deformity, hardware failure, DVT/PE, loss of toe/foot. Patient understands these risks and wishes to proceed with surgery. The surgical consent was reviewed with the patient all 3 pages were signed. No promises or guarantees were given to the outcome of the procedure. All questions were answered to the best of my ability. Before the surgery the patient was encouraged to call the office if there is any further questions. The surgery will be performed at the [facility] on an outpatient basis. -[facility]   [name], DPM

## 2015-11-21 NOTE — Patient Instructions (Signed)

## 2015-11-24 ENCOUNTER — Telehealth: Payer: Self-pay | Admitting: *Deleted

## 2015-11-24 NOTE — Telephone Encounter (Signed)
"  I was calling to see if I could schedule my surgery for the 25th of October.  If you would give me a call back, I'd appreciate it."

## 2015-11-25 NOTE — Telephone Encounter (Signed)
"  I'm calling to schedule my surgery."  When would you like to schedule?  "I would like to do it on October 25."  He does not have anything available that day.  "Okay, can we do it on November 8th then?"  Yes, that date is available.  You can register now online.  Instructions are in the brochure from the surgical center located in you blue bag.  Someone from the surgical center will call you with the arrival time.  "Okay thank you."

## 2015-12-08 ENCOUNTER — Other Ambulatory Visit: Payer: Self-pay | Admitting: Family Medicine

## 2015-12-10 ENCOUNTER — Telehealth: Payer: Self-pay

## 2015-12-10 NOTE — Telephone Encounter (Signed)
REFILL DENIED.

## 2015-12-10 NOTE — Telephone Encounter (Signed)
Should not need refill yet.  Refilled for 6 months in July.

## 2015-12-10 NOTE — Telephone Encounter (Signed)
Medication refill: Zolpidem Last refill 09-11-2015 #90, 1 rf Last OV 09-12-2015  Please advise on refil

## 2015-12-31 ENCOUNTER — Telehealth: Payer: Self-pay | Admitting: Sports Medicine

## 2015-12-31 ENCOUNTER — Encounter: Payer: Self-pay | Admitting: Podiatry

## 2015-12-31 DIAGNOSIS — T148XXS Other injury of unspecified body region, sequela: Secondary | ICD-10-CM | POA: Diagnosis not present

## 2015-12-31 DIAGNOSIS — M65879 Other synovitis and tenosynovitis, unspecified ankle and foot: Secondary | ICD-10-CM | POA: Diagnosis not present

## 2015-12-31 NOTE — Telephone Encounter (Signed)
Husband called stating that patient is in a lot of pain and that nerve block has started to wear off and wife is having terrible pain and is crying. Took Demerol 50mg  2 hours ago. I advised patient to take another Demerol now and for break through pain may take motrin or tylenol. I advised to add another pillow under the extremity and to ice. I also advised to check dressing to make sure they were not tight. Husband verbalized understanding. I encouraged patient's husband to call back after 1 hour if symptoms are not improved. -Dr. Cannon Kettle

## 2016-01-05 ENCOUNTER — Encounter: Payer: Self-pay | Admitting: Podiatry

## 2016-01-05 ENCOUNTER — Ambulatory Visit (INDEPENDENT_AMBULATORY_CARE_PROVIDER_SITE_OTHER): Payer: BLUE CROSS/BLUE SHIELD | Admitting: Podiatry

## 2016-01-05 VITALS — Temp 98.8°F

## 2016-01-05 DIAGNOSIS — Z9889 Other specified postprocedural states: Secondary | ICD-10-CM

## 2016-01-05 DIAGNOSIS — Z978 Presence of other specified devices: Secondary | ICD-10-CM | POA: Diagnosis not present

## 2016-01-05 DIAGNOSIS — T148XXA Other injury of unspecified body region, initial encounter: Secondary | ICD-10-CM

## 2016-01-05 DIAGNOSIS — M76821 Posterior tibial tendinitis, right leg: Secondary | ICD-10-CM

## 2016-01-05 MED ORDER — OXYCODONE-ACETAMINOPHEN 10-325 MG PO TABS: 1.0000 | ORAL_TABLET | Freq: Three times a day (TID) | ORAL | 0 refills | Status: DC | PRN

## 2016-01-05 MED ORDER — MEPERIDINE HCL 50 MG PO TABS: 50.0000 mg | ORAL_TABLET | ORAL | 0 refills | Status: DC | PRN

## 2016-01-06 NOTE — Progress Notes (Signed)
Subjective: [name] is a 60 y.o. is seen today in office s/p right PT tendon repair, ATFL repair, peroneal tendon debridement preformed on . They state their pain is 12/31/15. She states that she has been nonweightbearing. Her pain is better controlled. She was taking Tylenol in between the Demerol. She is continue with antibiotics. She is also continue with Lovenox injections daily. Denies any systemic complaints such as fevers, chills, nausea, vomiting. No calf pain, chest pain, shortness of breath.   Objective: General: No acute distress, AAOx3  DP/PT pulses palpable 2/4, CRT < 3 sec to all digits.  Protective sensation intact. Motor function intact.  Right foot: Posterior splint intact. Incision is well coapted without any evidence of dehiscence and sutures and staples are intact. There is faint amount of erythema around the incision which corresponds directly along the bandage/Xeroform which is removed. There is no drainage or pus there is no ascending cellulitis. There is minimal edema around the incision site. There is moderate tenderness along the surgical sites. No other areas of tenderness bilaterally. No other open lesions or pre-ulcerative lesions.  No pain with calf compression, swelling, warmth, erythema.   Assessment and Plan:  Status post right ankle surgery, doing well with no complications   -Treatment options discussed including all alternatives, risks, and complications -Bandages/splint was removed. Antibiotic ointment was applied on the incision followed by  a dry sterile dressing. A well-padded below-knee fiberglass cast was applied making sure to pad all bony prominences. -Continue nonweightbearing -Continue Lovenox injections for 2 weeks. Once complete she can start aspirin daily. -Ice/elevation -Pain medication as needed. Refilled Demerol today. -Monitor for any clinical signs or symptoms of infection and DVT/PE and directed to call the office immediately should  any occur or go to the ER. -Follow-up in 10 days or sooner if any problems arise. In the meantime, encouraged to call the office with any questions, concerns, change in symptoms.   *possible suture removal and cast re-application next appointment.   [name], DPM

## 2016-01-07 ENCOUNTER — Ambulatory Visit: Payer: BLUE CROSS/BLUE SHIELD | Admitting: Podiatry

## 2016-01-12 ENCOUNTER — Encounter: Payer: BLUE CROSS/BLUE SHIELD | Admitting: Podiatry

## 2016-01-14 ENCOUNTER — Telehealth: Payer: Self-pay | Admitting: *Deleted

## 2016-01-14 MED ORDER — ENOXAPARIN SODIUM 40 MG/0.4ML ~~LOC~~ SOLN: 40.0000 mg | SUBCUTANEOUS | 0 refills | Status: DC

## 2016-01-14 MED ORDER — PROMETHAZINE HCL 25 MG PO TABS: 25.0000 mg | ORAL_TABLET | Freq: Four times a day (QID) | ORAL | 0 refills | Status: DC | PRN

## 2016-01-14 NOTE — Telephone Encounter (Signed)
Pt asked for refill Phenergan, and if she needed to continue the blood clot injections. Left message requesting orders for Lovenox for Dr, Jacqualyn Posey. Left message for Dr. Jacqualyn Posey re orders for Lovenox. Left message to call with more information concerning her Lovenox. Pt states she is out of Lovenox. Dr. Jacqualyn Posey states refill Lovenox 40mg  one daily for 2 weeks, once 2 weeks of Lovenox completed, pt is to take 81mg  Aspirin daily until further instructed and refill the Phenergan. Orders to pt and escribed to [facility].

## 2016-01-19 ENCOUNTER — Ambulatory Visit (INDEPENDENT_AMBULATORY_CARE_PROVIDER_SITE_OTHER): Payer: BLUE CROSS/BLUE SHIELD | Admitting: Podiatry

## 2016-01-19 ENCOUNTER — Encounter: Payer: Self-pay | Admitting: Podiatry

## 2016-01-19 DIAGNOSIS — Z9889 Other specified postprocedural states: Secondary | ICD-10-CM

## 2016-01-19 DIAGNOSIS — M76821 Posterior tibial tendinitis, right leg: Secondary | ICD-10-CM

## 2016-01-19 DIAGNOSIS — T148XXA Other injury of unspecified body region, initial encounter: Secondary | ICD-10-CM | POA: Diagnosis not present

## 2016-01-19 DIAGNOSIS — Z978 Presence of other specified devices: Secondary | ICD-10-CM

## 2016-01-21 NOTE — Progress Notes (Signed)
Subjective: [name] is a 60 y.o. is seen today in office s/p right posterior tibial tendon repair; ATFL repair and peroneal tendon debridement preformed on 01/14/16.  She states that her pain is better controlled. She has been NWB. She has been using lovenox. She has been taking antibiotics. Denies any systemic complaints such as fevers, chills, nausea, vomiting. No calf pain, chest pain, shortness of breath.   Objective: General: No acute distress, AAOx3  DP/PT pulses palpable 2/4, CRT < 3 sec to all digits.  Protective sensation intact. Motor function intact.  Right foot: Incision is well coapted without any evidence of dehiscence and sutures/staples are intact. There is no surrounding erythema, ascending cellulitis, fluctuance, crepitus, malodor, drainage/purulence. There is mild edema around the surgical site. There is mild pain along the surgical site.  No other areas of tenderness to bilateral lower extremities.  No other open lesions or pre-ulcerative lesions.  No pain with calf compression, swelling, warmth, erythema.   Assessment and Plan:  Status post right ankle surgery, doing well with no complications   -Treatment options discussed including all alternatives, risks, and complications -Antibiotic ointment was applied followed by a bandage.Well-padded below-knee fiberglass cast was applied making sure to pad all bony prominences. -Ice/elevation -Pain medication as needed. -Lovenox  -Monitor for any clinical signs or symptoms of infection and DVT/PE and directed to call the office immediately should any occur or go to the ER. -Follow-up as scheduled or sooner if any problems arise. In the meantime, encouraged to call the office with any questions, concerns, change in symptoms.   [name], DPM

## 2016-01-30 ENCOUNTER — Encounter: Payer: Self-pay | Admitting: Podiatry

## 2016-02-02 ENCOUNTER — Ambulatory Visit (INDEPENDENT_AMBULATORY_CARE_PROVIDER_SITE_OTHER): Payer: BLUE CROSS/BLUE SHIELD | Admitting: Podiatry

## 2016-02-02 DIAGNOSIS — Z9889 Other specified postprocedural states: Secondary | ICD-10-CM

## 2016-02-02 DIAGNOSIS — M76821 Posterior tibial tendinitis, right leg: Secondary | ICD-10-CM

## 2016-02-02 DIAGNOSIS — T148XXA Other injury of unspecified body region, initial encounter: Secondary | ICD-10-CM

## 2016-02-02 MED ORDER — PROMETHAZINE HCL 25 MG PO TABS: 25.0000 mg | ORAL_TABLET | Freq: Four times a day (QID) | ORAL | 0 refills | Status: DC | PRN

## 2016-02-02 MED ORDER — MEPERIDINE HCL 50 MG PO TABS: 50.0000 mg | ORAL_TABLET | ORAL | 0 refills | Status: DC | PRN

## 2016-02-02 NOTE — Progress Notes (Signed)
Subjective: [name] is a 60 y.o. is seen today in office s/p right posterior tibial tendon repair; ATFL repair and peroneal tendon debridement preformed on 01/14/16.  She states that she is doing well and having minimal pain. She feels that she is improving. She has been nonweightbearing. Denies any systemic complaints such as fevers, chills, nausea, vomiting. No calf pain, chest pain, shortness of breath.   Objective: General: No acute distress, AAOx3  DP/PT pulses palpable 2/4, CRT < 3 sec to all digits.  Protective sensation intact. Motor function intact.  Right foot: Incision is well coapted without any evidence of dehiscence and sutures/staples are intact.  There is no swelling erythema, ascending cellulitis but is no fluctuation or crepitus or any malodor there is no clinical signs of infection. Mild tender saw patient on surgical sites most on the medial incision just inferior to the medial malleolus. There is no other areas of tenderness bilaterally. No other open lesions or pre-ulcerative lesions.  No pain with calf compression, swelling, warmth, erythema.   Assessment and Plan:  Status post right ankle surgery, doing well with no complications   -Treatment options discussed including all alternatives, risks, and complications -Staples/sutures removed today. Antibiotic on it was applied followed by a bandage. She was transitioned to a cam boot today however remain nonweightbearing. She can start passive range of motion exercises at home as tolerated. Continue ice and elevation. Refilled pain medication to take as needed as well as Phenergan. -She is completed 4 weeks of Lovenox. Significant aspirin without couple weeks until weightbearing. -Monitor for any clinical signs or symptoms of infection and DVT/PE and directed to call the office immediately should any occur or go to the ER. -Follow-up as scheduled or sooner if any problems arise. In the meantime, encouraged to call the  office with any questions, concerns, change in symptoms.   [name], DPM

## 2016-02-11 NOTE — Progress Notes (Signed)
DOS 11.08.2017 Right Repair of Posterior Tibial Tendon, Peroneal Tendon Repair, Lateral Ankle Stabilization, Cast Application

## 2016-02-20 ENCOUNTER — Encounter: Payer: Self-pay | Admitting: Podiatry

## 2016-02-20 ENCOUNTER — Ambulatory Visit (INDEPENDENT_AMBULATORY_CARE_PROVIDER_SITE_OTHER): Payer: Medicare Other | Admitting: Podiatry

## 2016-02-20 DIAGNOSIS — Z9889 Other specified postprocedural states: Secondary | ICD-10-CM

## 2016-02-20 DIAGNOSIS — T148XXA Other injury of unspecified body region, initial encounter: Secondary | ICD-10-CM

## 2016-02-20 DIAGNOSIS — M76821 Posterior tibial tendinitis, right leg: Secondary | ICD-10-CM

## 2016-02-20 NOTE — Progress Notes (Signed)
Subjective: [name] is a 60 y.o. is seen today in office s/p right posterior tibial tendon repair; ATFL repair and peroneal tendon debridement preformed on 01/14/16. She states that she is doing well and her pain is much improved. She has some occasional soreness at times she is doing better. She's remain in a cam boot she has started some passive range of motion exercises. She continues to her left foot is doing well. Overall she states that she is happy with the results of for the surgery. Denies any systemic complaints such as fevers, chills, nausea, vomiting. No calf pain, chest pain, shortness of breath.   Objective: General: No acute distress, AAOx3  DP/PT pulses palpable 2/4, CRT < 3 sec to all digits.  Protective sensation intact. Motor function intact.  Right foot: Incision is well coapted without any evidence of dehiscence and a scar has formed  There is no surrounding erythema, ascending cellulitis but is no fluctuation or crepitus or any malodor there is no clinical signs of infection. Mild tender saw patient on surgical sites most on the medial incision just inferior to the medial malleolus. There is no other areas of tenderness bilaterally. No other open lesions or pre-ulcerative lesions.  No pain with calf compression, swelling, warmth, erythema.   Assessment and Plan:  Status post right ankle surgery, doing well with no complications   -Treatment options discussed including all alternatives, risks, and complications -At this time she started transition to weight-bear as tolerated in the cam boot most with a cam boot at all times. The second gradual transition over the next several weeks. Continue with range of motion, rehabilitation exercises as well. Continue ice and elevation. Pain medication as needed but she has not been taking any medicine. -Monitor for any clinical signs or symptoms of infection and DVT/PE and directed to call the office immediately should any occur or  go to the ER. -Follow-up as scheduled or sooner if any problems arise. In the meantime, encouraged to call the office with any questions, concerns, change in symptoms.   [name], DPM

## 2016-03-09 ENCOUNTER — Telehealth: Payer: Self-pay

## 2016-03-09 ENCOUNTER — Other Ambulatory Visit: Payer: Self-pay

## 2016-03-09 MED ORDER — ZOLPIDEM TARTRATE 10 MG PO TABS: 10.0000 mg | ORAL_TABLET | Freq: Every evening | ORAL | 0 refills | Status: DC | PRN

## 2016-03-09 MED ORDER — PRAMIPEXOLE DIHYDROCHLORIDE 0.125 MG PO TABS
0.3750 mg | ORAL_TABLET | Freq: Every day | ORAL | 1 refills | Status: DC
Start: 2016-03-09 — End: 2016-04-09

## 2016-03-09 NOTE — Telephone Encounter (Signed)
Refill once 

## 2016-03-09 NOTE — Telephone Encounter (Signed)
[facility] is requesting refill on Ambien  Last OV 09-11-2015 Last refill 09-12-2015 #[id number], 1rf Please advise

## 2016-03-09 NOTE — Addendum Note (Signed)
Addended by: Elio Forget on: 03/09/2016 01:27 PM   Modules accepted: Orders

## 2016-03-09 NOTE — Telephone Encounter (Signed)
Faxed to [facility].

## 2016-03-12 ENCOUNTER — Ambulatory Visit (INDEPENDENT_AMBULATORY_CARE_PROVIDER_SITE_OTHER): Payer: Self-pay | Admitting: Podiatry

## 2016-03-12 DIAGNOSIS — Z9889 Other specified postprocedural states: Secondary | ICD-10-CM

## 2016-03-12 DIAGNOSIS — M76821 Posterior tibial tendinitis, right leg: Secondary | ICD-10-CM

## 2016-03-12 DIAGNOSIS — T148XXA Other injury of unspecified body region, initial encounter: Secondary | ICD-10-CM

## 2016-03-15 NOTE — Progress Notes (Signed)
Subjective: AYZEL STIGALL is a 61 y.o. is seen today in office s/p right posterior tibial tendon repair; ATFL repair and peroneal tendon debridement preformed on 01/14/16. She states that she is doing well. She does walk in the CAM boot without crutches and does well. She has not yet tried without the boot. Denies any systemic complaints such as fevers, chills, nausea, vomiting. No calf pain, chest pain, shortness of breath.   Objective: General: No acute distress, AAOx3  DP/PT pulses palpable 2/4, CRT < 3 sec to all digits.  Protective sensation intact. Motor function intact.  Right foot: Incision is well coapted without any evidence of dehiscence and a scar has formed  There is no surrounding erythema, ascending cellulitis but is no fluctuation or crepitus or any malodor there is no clinical signs of infection. Mild tender saw patient on surgical sites most on the medial incision just inferior to the medial malleolus although this continues to improve . There is no other areas of tenderness bilaterally. There is minimal edema.  No other open lesions or pre-ulcerative lesions.  No pain with calf compression, swelling, warmth, erythema.   Assessment and Plan:  Status post right ankle surgery, doing well   -Treatment options discussed including all alternatives, risks, and complications -She can start to transition to a regular shoe as tolerated with an ankle brace before going without the brace. She declined PT again today and she has the exercises at home that she did before. If she starts to have any difficulty transitioning to a shoe will re-consider PT.  -Continue Ice/elevation.  -Monitor for any clinical signs or symptoms of infection and DVT/PE and directed to call the office immediately should any occur or go to the ER. -Follow-up as scheduled or sooner if any problems arise. In the meantime, encouraged to call the office with any questions, concerns, change in symptoms.   [name], DPM

## 2016-03-26 ENCOUNTER — Ambulatory Visit (INDEPENDENT_AMBULATORY_CARE_PROVIDER_SITE_OTHER): Payer: BLUE CROSS/BLUE SHIELD | Admitting: Family Medicine

## 2016-03-26 ENCOUNTER — Encounter: Payer: Self-pay | Admitting: Family Medicine

## 2016-03-26 VITALS — BP 130/80 | HR 72

## 2016-03-26 DIAGNOSIS — Z862 Personal history of diseases of the blood and blood-forming organs and certain disorders involving the immune mechanism: Secondary | ICD-10-CM | POA: Diagnosis not present

## 2016-03-26 DIAGNOSIS — M26621 Arthralgia of right temporomandibular joint: Secondary | ICD-10-CM

## 2016-03-26 DIAGNOSIS — G2581 Restless legs syndrome: Secondary | ICD-10-CM | POA: Diagnosis not present

## 2016-03-26 DIAGNOSIS — F5104 Psychophysiologic insomnia: Secondary | ICD-10-CM

## 2016-03-26 LAB — CBC WITH DIFFERENTIAL/PLATELET
Basophils Absolute: 0 10*3/uL (ref 0.0–0.1)
Basophils Relative: 0.8 % (ref 0.0–3.0)
Eosinophils Absolute: 0.1 10*3/uL (ref 0.0–0.7)
Eosinophils Relative: 2.8 % (ref 0.0–5.0)
HCT: 35.3 % — ABNORMAL LOW (ref 36.0–46.0)
Hemoglobin: 11.4 g/dL — ABNORMAL LOW (ref 12.0–15.0)
Lymphocytes Relative: 25.8 % (ref 12.0–46.0)
Lymphs Abs: 1.1 10*3/uL (ref 0.7–4.0)
MCHC: 32.4 g/dL (ref 30.0–36.0)
MCV: 84.5 fl (ref 78.0–100.0)
Monocytes Absolute: 0.6 10*3/uL (ref 0.1–1.0)
Monocytes Relative: 13.9 % — ABNORMAL HIGH (ref 3.0–12.0)
Neutro Abs: 2.3 10*3/uL (ref 1.4–7.7)
Neutrophils Relative %: 56.7 % (ref 43.0–77.0)
Platelets: 398 10*3/uL (ref 150.0–400.0)
RBC: 4.17 Mil/uL (ref 3.87–5.11)
RDW: 17.4 % — ABNORMAL HIGH (ref 11.5–15.5)
WBC: 4.1 10*3/uL (ref 4.0–10.5)

## 2016-03-26 LAB — FERRITIN: Ferritin: 6.8 ng/mL — ABNORMAL LOW (ref 10.0–291.0)

## 2016-03-26 MED ORDER — CLONAZEPAM 0.5 MG PO TABS: 0.5000 mg | ORAL_TABLET | Freq: Every day | ORAL | 1 refills | Status: DC

## 2016-03-26 NOTE — Patient Instructions (Addendum)
Temporomandibular Joint Syndrome Temporomandibular joint (TMJ) syndrome is a condition that affects the joints between your jaw and your skull. The TMJs are located near your ears and allow your jaw to open and close. These joints and the nearby muscles are involved in all movements of the jaw. People with TMJ syndrome have pain in the area of these joints and muscles. Chewing, biting, or other movements of the jaw can be difficult or painful. TMJ syndrome can be caused by various things. In many cases, the condition is mild and goes away within a few weeks. For some people, the condition can become a long-term problem. What are the causes? Possible causes of TMJ syndrome include:  Grinding your teeth or clenching your jaw. Some people do this when they are under stress.  Arthritis.  Injury to the jaw.  Head or neck injury.  Teeth or dentures that are not aligned well. In some cases, the cause of TMJ syndrome may not be known. What are the signs or symptoms? The most common symptom is an aching pain on the side of the head in the area of the TMJ. Other symptoms may include:  Pain when moving your jaw, such as when chewing or biting.  Being unable to open your jaw all the way.  Making a clicking sound when you open your mouth.  Headache.  Earache.  Neck or shoulder pain. How is this diagnosed? Diagnosis can usually be made based on your symptoms, your medical history, and a physical exam. Your health care provider may check the range of motion of your jaw. Imaging tests, such as X-rays or an MRI, are sometimes done. You may need to see your dentist to determine if your teeth and jaw are lined up correctly. How is this treated? TMJ syndrome often goes away on its own. If treatment is needed, the options may include:  Eating soft foods and applying ice or heat.  Medicines to relieve pain or inflammation.  Medicines to relax the muscles.  A splint, bite plate, or mouthpiece to  prevent teeth grinding or jaw clenching.  Relaxation techniques or counseling to help reduce stress.  Transcutaneous electrical nerve stimulation (TENS). This helps to relieve pain by applying an electrical current through the skin.  Acupuncture. This is sometimes helpful to relieve pain.  Jaw surgery. This is rarely needed. Follow these instructions at home:  Take medicines only as directed by your health care provider.  Eat a soft diet if you are having trouble chewing.  Apply ice to the painful area.  Put ice in a plastic bag.  Place a towel between your skin and the bag.  Leave the ice on for 20 minutes, 2-3 times a day.  Apply a warm compress to the painful area as directed.  Massage your jaw area and perform any jaw stretching exercises as recommended by your health care provider.  If you were given a mouthpiece or bite plate, wear it as directed.  Avoid foods that require a lot of chewing. Do not chew gum.  Keep all follow-up visits as directed by your health care provider. This is important. Contact a health care provider if:  You are having trouble eating.  You have new or worsening symptoms. Get help right away if:  Your jaw locks open or closed. This information is not intended to replace advice given to you by your health care provider. Make sure you discuss any questions you have with your health care provider. Document Released: 11/03/2000 Document  Revised: 10/09/2015 Document Reviewed: 09/13/2013 Elsevier Interactive Patient Education  2017 [employer].  Stop the Ambien Continue with the [employer] Klonopin 0.5 mg at night. After 3-4 nights if insomnia still severe may increase to two at night. Insomnia Insomnia is a sleep disorder that makes it difficult to fall asleep or to stay asleep. Insomnia can cause tiredness (fatigue), low energy, difficulty concentrating, mood swings, and poor performance at work or school. There are three different ways  to classify insomnia:  Difficulty falling asleep.  Difficulty staying asleep.  Waking up too early in the morning. Any type of insomnia can be long-term (chronic) or short-term (acute). Both are common. Short-term insomnia usually lasts for three months or less. Chronic insomnia occurs at least three times a week for longer than three months. What are the causes? Insomnia may be caused by another condition, situation, or substance, such as:  Anxiety.  Certain medicines.  Gastroesophageal reflux disease (GERD) or other gastrointestinal conditions.  Asthma or other breathing conditions.  Restless legs syndrome, sleep apnea, or other sleep disorders.  Chronic pain.  Menopause. This may include hot flashes.  Stroke.  Abuse of alcohol, tobacco, or illegal drugs.  Depression.  Caffeine.  Neurological disorders, such as Alzheimer disease.  An overactive thyroid (hyperthyroidism). The cause of insomnia may not be known. What increases the risk? Risk factors for insomnia include:  Gender. Women are more commonly affected than men.  Age. Insomnia is more common as you get older.  Stress. This may involve your professional or personal life.  Income. Insomnia is more common in people with lower income.  Lack of exercise.  Irregular work schedule or night shifts.  Traveling between different time zones. What are the signs or symptoms? If you have insomnia, trouble falling asleep or trouble staying asleep is the main symptom. This may lead to other symptoms, such as:  Feeling fatigued.  Feeling nervous about going to sleep.  Not feeling rested in the morning.  Having trouble concentrating.  Feeling irritable, anxious, or depressed. How is this treated? Treatment for insomnia depends on the cause. If your insomnia is caused by an underlying condition, treatment will focus on addressing the condition. Treatment may also include:  Medicines to help you  sleep.  Counseling or therapy.  Lifestyle adjustments. Follow these instructions at home:  Take medicines only as directed by your health care provider.  Keep regular sleeping and waking hours. Avoid naps.  Keep a sleep diary to help you and your health care provider figure out what could be causing your insomnia. Include:  When you sleep.  When you wake up during the night.  How well you sleep.  How rested you feel the next day.  Any side effects of medicines you are taking.  What you eat and drink.  Make your bedroom a comfortable place where it is easy to fall asleep:  Put up shades or special blackout curtains to block light from outside.  Use a white noise machine to block noise.  Keep the temperature cool.  Exercise regularly as directed by your health care provider. Avoid exercising right before bedtime.  Use relaxation techniques to manage stress. Ask your health care provider to suggest some techniques that may work well for you. These may include:  Breathing exercises.  Routines to release muscle tension.  Visualizing peaceful scenes.  Cut back on alcohol, caffeinated beverages, and cigarettes, especially close to bedtime. These can disrupt your sleep.  Do not overeat or eat spicy  foods right before bedtime. This can lead to digestive discomfort that can make it hard for you to sleep.  Limit screen use before bedtime. This includes:  Watching TV.  Using your smartphone, tablet, and computer.  Stick to a routine. This can help you fall asleep faster. Try to do a quiet activity, brush your teeth, and go to bed at the same time each night.  Get out of bed if you are still awake after 15 minutes of trying to sleep. Keep the lights down, but try reading or doing a quiet activity. When you feel sleepy, go back to bed.  Make sure that you drive carefully. Avoid driving if you feel very sleepy.  Keep all follow-up appointments as directed by your health  care provider. This is important. Contact a health care provider if:  You are tired throughout the day or have trouble in your daily routine due to sleepiness.  You continue to have sleep problems or your sleep problems get worse. Get help right away if:  You have serious thoughts about hurting yourself or someone else. This information is not intended to replace advice given to you by your health care provider. Make sure you discuss any questions you have with your health care provider. Document Released: 02/06/2000 Document Revised: 07/11/2015 Document Reviewed: 11/09/2013 Elsevier Interactive Patient Education  2017 [employer].

## 2016-03-26 NOTE — Progress Notes (Signed)
Subjective:     Patient ID: [name], female   DOB: January 29, 1956, 61 y.o.   MRN: [id number]  HPI Patient with multiple issues as follows  Insomnia for several years. She's been taking Ambien but usually only getting about 2 hour sleep at night. She has history of restless leg syndrome and is aware that she is having some ongoing symptoms in spite of fairly high-dose Mirapex. No alcohol use. Minimal caffeine use. She does have prior history of anemia and is not had any recent CBC. She's tried things like melatonin in the past without success.  Right jaw pain for several months. Somewhat left jaw but mostly right around the TMJ joint. She notices worse with eating. She describes dull achy pain which is moderate to severe at times. No facial edema. Worse with chewing. No hearing changes. Cannot take nonsteroidals because of her previous gastric bypass surgery. She's not tried any icing.  Past Medical History:  Diagnosis Date  . ANGIOMA 10/03/2008   REMOVED FROM RIGHT LOWER LEG-BENIGN  . Anxiety   . Arthritis    RA  . Bronchitis    hx of   . Cancer (HCC)    BASAL CELL SKIN CANCER  . Complication of anesthesia   . Fibromyalgia   . GERD 08/27/2008  . History of blood transfusion    1995  . History of hiatal hernia    hx of has had surgically repaired  . History of measles   . History of mumps   . History of nonmelanoma skin cancer   . HYPERLIPIDEMIA 08/27/2008  . HYPERTENSION 08/27/2008   pt is currently on no meds   . Pneumonia    hx of   . PONV (postoperative nausea and vomiting)   . SEBORRHEIC KERATOSIS 08/27/2008   Past Surgical History:  Procedure Laterality Date  . ABDOMINAL HYSTERECTOMY  1995  . APPENDECTOMY    . BREAST DUCTAL SYSTEM EXCISION  08/16/2011   Procedure: EXCISION DUCTAL SYSTEM BREAST;  Surgeon: Joyice Faster. Cornett, MD;  Location: [facility];  Service: General;  Laterality: Left;  left breast duct excision  . [place]   times 2; 1980  and 1982  . [place]  . CHOLECYSTECTOMY  1997  . EXCISION/RELEASE BURSA HIP Right 10/21/2014   Procedure: OPEN RIGHT HIP BURSECTOMY, EXOSECTOMY;  Surgeon: Paralee Cancel, MD;  Location: [facility];  Service: Orthopedics;  Laterality: Right;  . foot surgery  2004, 2005, 2008   bilat secondary to neuropathy   . JOINT REPLACEMENT  10,12   lt total knee X2  . KNEE ARTHROSCOPY  2007, 2008, 2009  . LAPAROSCOPIC GASTRIC SLEEVE RESECTION N/A 10/30/2013   Procedure: LAPAROSCOPIC GASTRIC SLEEVE RESECTION WITH HIATAL HERNIA REPAIR AND UPPER ENDOSCOPY;  Surgeon: Greer Pickerel, MD;  Location: [facility];  Service: General;  Laterality: N/A;  . NASAL SINUS SURGERY  2013  . OPEN SURGICAL REPAIR OF GLUTEAL TENDON  01/17/2012   Procedure: OPEN SURGICAL REPAIR OF GLUTEAL TENDON;  Surgeon: Mauri Pole, MD;  Location: [facility];  Service: Orthopedics;  Laterality: Right;    reports that she quit smoking about 32 years ago. Her smoking use included Cigarettes. She has a 1.00 pack-year smoking history. She has never used smokeless tobacco. She reports that she does not drink alcohol or use drugs. family history includes Cancer in her father, mother, and other; Diabetes in her other; Heart disease in her other; Hyperlipidemia in her other; Hypertension in her  other; Kidney disease in her other. Allergies  Allergen Reactions  . Bee Venom Anaphylaxis  . Codeine Sulfate Other (See Comments)    GI upset  . Penicillins Rash     Review of Systems  Constitutional: Positive for fatigue.  Eyes: Negative for visual disturbance.  Respiratory: Negative for cough, chest tightness, shortness of breath and wheezing.   Cardiovascular: Negative for chest pain, palpitations and leg swelling.  Neurological: Negative for dizziness, seizures, syncope, weakness, light-headedness and headaches.  Psychiatric/Behavioral: Positive for sleep disturbance. Negative for dysphoric mood.       Objective:   Physical Exam   Constitutional: She appears well-developed and well-nourished.  HENT:  She has tenderness over the right TMJ joint. Oropharynx is clear. No parotid swelling. Ear canals are clear. Eardrums appear normal.  Neck: Neck supple.  Cardiovascular: Normal rate and regular rhythm.   Pulmonary/Chest: Effort normal and breath sounds normal. No respiratory distress. She has no wheezes. She has no rales.  Musculoskeletal: She exhibits no edema.  Lymphadenopathy:    She has no cervical adenopathy.  Psychiatric: She has a normal mood and affect. Her behavior is normal. Judgment and thought content normal.       Assessment:     #1 chronic insomnia  #2 restless  leg syndrome with persistent symptoms in spite of Mirapex  #3 past history of anemia which could be exacerbating #2  #4 probable TMJ syndrome-right greater than left    Plan:     -Avoid nonsteroidals with past history of gastric bypass -Try some icing to the right TMJ region -Discontinue Ambien -Sleep hygiene discussed -Check CBC and ferritin level and correct any anemia -Consider trial of Klonopin 0.5 mg daily at bedtime -Reassess 3 weeks  Eulas Post MD [facility] at [facility]

## 2016-03-26 NOTE — Progress Notes (Signed)
Pre visit review using our clinic review tool, if applicable. No additional management support is needed unless otherwise documented below in the visit note. 

## 2016-03-29 ENCOUNTER — Other Ambulatory Visit: Payer: Self-pay

## 2016-03-29 MED ORDER — IRON 325 (65 FE) MG PO TABS: ORAL_TABLET | ORAL | 0 refills | Status: DC

## 2016-04-07 ENCOUNTER — Ambulatory Visit (INDEPENDENT_AMBULATORY_CARE_PROVIDER_SITE_OTHER): Payer: BLUE CROSS/BLUE SHIELD | Admitting: Family Medicine

## 2016-04-07 VITALS — BP 120/70 | HR 70

## 2016-04-07 DIAGNOSIS — G4700 Insomnia, unspecified: Secondary | ICD-10-CM

## 2016-04-07 DIAGNOSIS — M26621 Arthralgia of right temporomandibular joint: Secondary | ICD-10-CM | POA: Diagnosis not present

## 2016-04-07 DIAGNOSIS — G2581 Restless legs syndrome: Secondary | ICD-10-CM

## 2016-04-07 NOTE — Progress Notes (Signed)
Pre visit review using our clinic review tool, if applicable. No additional management support is needed unless otherwise documented below in the visit note. 

## 2016-04-07 NOTE — Patient Instructions (Signed)
Dr Diona Browner [phone]

## 2016-04-07 NOTE — Progress Notes (Signed)
Subjective:     Patient ID: [name], female   DOB: 1955/04/02, 61 y.o.   MRN: [id number]  HPI Persistent right jaw pain. Suspected TMJ. She never went to oral surgeon. The surgeon's name we have given no longer sees patient for this problem. She has consistent pain with eating. No visible swelling. Pain is making it very difficult eating at times. No relief with icing. Cannot take nonsteroidals because of gastric bypass history  Restless leg syndrome. Recent low ferritin 6.8. She is on iron replacement. Recently tried addition of Klonopin to her Mirapex. She was up to 1 mg but did not see any improvement in her restless legs. Currently takes Mirapex 0.375 mg at night  Past Medical History:  Diagnosis Date  . ANGIOMA 10/03/2008   REMOVED FROM RIGHT LOWER LEG-BENIGN  . Anxiety   . Arthritis    RA  . Bronchitis    hx of   . Cancer (HCC)    BASAL CELL SKIN CANCER  . Complication of anesthesia   . Fibromyalgia   . GERD 08/27/2008  . History of blood transfusion    1995  . History of hiatal hernia    hx of has had surgically repaired  . History of measles   . History of mumps   . History of nonmelanoma skin cancer   . HYPERLIPIDEMIA 08/27/2008  . HYPERTENSION 08/27/2008   pt is currently on no meds   . Pneumonia    hx of   . PONV (postoperative nausea and vomiting)   . SEBORRHEIC KERATOSIS 08/27/2008   Past Surgical History:  Procedure Laterality Date  . ABDOMINAL HYSTERECTOMY  1995  . APPENDECTOMY    . BREAST DUCTAL SYSTEM EXCISION  08/16/2011   Procedure: EXCISION DUCTAL SYSTEM BREAST;  Surgeon: Joyice Faster. Cornett, MD;  Location: [facility];  Service: General;  Laterality: Left;  left breast duct excision  . [place]   times 2; 1980 and 1982  . [place]  . CHOLECYSTECTOMY  1997  . EXCISION/RELEASE BURSA HIP Right 10/21/2014   Procedure: OPEN RIGHT HIP BURSECTOMY, EXOSECTOMY;  Surgeon: Paralee Cancel, MD;  [facility];  Service:  Orthopedics;  Laterality: Right;  . foot surgery  2004, 2005, 2008   bilat secondary to neuropathy   . JOINT REPLACEMENT  10,12   lt total knee X2  . KNEE ARTHROSCOPY  2007, 2008, 2009  . LAPAROSCOPIC GASTRIC SLEEVE RESECTION N/A 10/30/2013   Procedure: LAPAROSCOPIC GASTRIC SLEEVE RESECTION WITH HIATAL HERNIA REPAIR AND UPPER ENDOSCOPY;  Surgeon: Greer Pickerel, MD;  Location[facility];  Service: General;  Laterality: N/A;  . NASAL SINUS SURGERY  2013  . OPEN SURGICAL REPAIR OF GLUTEAL TENDON  01/17/2012   Procedure: OPEN SURGICAL REPAIR OF GLUTEAL TENDON;  Surgeon: Mauri Pole, MD;  Location: [facility];  Service: Orthopedics;  Laterality: Right;    reports that she quit smoking about 32 years ago. Her smoking use included Cigarettes. She has a 1.00 pack-year smoking history. She has never used smokeless tobacco. She reports that she does not drink alcohol or use drugs. family history includes Cancer in her father, mother, and other; Diabetes in her other; Heart disease in her other; Hyperlipidemia in her other; Hypertension in her other; Kidney disease in her other. Allergies  Allergen Reactions  . Bee Venom Anaphylaxis  . Codeine Sulfate Other (See Comments)    GI upset  . Penicillins Rash     Review of  Systems  Constitutional: Negative for fatigue.  Eyes: Negative for visual disturbance.  Respiratory: Negative for cough, chest tightness, shortness of breath and wheezing.   Cardiovascular: Negative for chest pain, palpitations and leg swelling.  Neurological: Negative for dizziness, seizures, syncope, weakness, light-headedness and headaches.  Psychiatric/Behavioral: Positive for sleep disturbance. Negative for dysphoric mood.       Objective:   Physical Exam  Constitutional: She appears well-developed and well-nourished.  HENT:  She has difficulty opening her mouth fully secondary to right jaw pain. She has tenderness over the right TMJ joint. No visible swelling.  Eyes: Pupils are  equal, round, and reactive to light.  Neck: Neck supple. No JVD present. No thyromegaly present.  Cardiovascular: Normal rate and regular rhythm.  Exam reveals no gallop.   Pulmonary/Chest: Effort normal and breath sounds normal. No respiratory distress. She has no wheezes. She has no rales.  Musculoskeletal: She exhibits no edema.  Neurological: She is alert.       Assessment:     #1 progressive right TMJ syndrome  #2 restless leg syndrome-possibly exacerbated by low ferritin.  #3 chronic insomnia largely related to #2    Plan:     -She was given name of local oral surgeon to see regarding TMJ -Continue iron replacement and recheck ferritin level in one month -Discontinue Klonopin which has not helped with her insomnia or restless legs -Previous intolerance with gabapentin. -Increase Mirapex to 0.5 mg daily at bedtime -Continued avoidance of caffeine  Eulas Post MD [facility]

## 2016-04-08 ENCOUNTER — Encounter: Payer: Self-pay | Admitting: Family Medicine

## 2016-04-09 ENCOUNTER — Other Ambulatory Visit: Payer: Self-pay | Admitting: Podiatry

## 2016-04-09 ENCOUNTER — Ambulatory Visit (INDEPENDENT_AMBULATORY_CARE_PROVIDER_SITE_OTHER): Payer: BLUE CROSS/BLUE SHIELD

## 2016-04-09 ENCOUNTER — Ambulatory Visit (INDEPENDENT_AMBULATORY_CARE_PROVIDER_SITE_OTHER): Payer: BLUE CROSS/BLUE SHIELD | Admitting: Podiatry

## 2016-04-09 ENCOUNTER — Other Ambulatory Visit: Payer: Self-pay

## 2016-04-09 DIAGNOSIS — M216X2 Other acquired deformities of left foot: Secondary | ICD-10-CM

## 2016-04-09 DIAGNOSIS — M2012 Hallux valgus (acquired), left foot: Secondary | ICD-10-CM

## 2016-04-09 DIAGNOSIS — M779 Enthesopathy, unspecified: Secondary | ICD-10-CM | POA: Diagnosis not present

## 2016-04-09 DIAGNOSIS — Z9889 Other specified postprocedural states: Secondary | ICD-10-CM

## 2016-04-09 MED ORDER — PRAMIPEXOLE DIHYDROCHLORIDE 0.5 MG PO TABS: 0.5000 mg | ORAL_TABLET | Freq: Every day | ORAL | 1 refills | Status: DC

## 2016-04-12 DIAGNOSIS — M26601 Right temporomandibular joint disorder, unspecified: Secondary | ICD-10-CM | POA: Diagnosis not present

## 2016-04-12 NOTE — Progress Notes (Signed)
Subjective: [name] is a 61 y.o. is seen today in office s/p right posterior tibial tendon repair; ATFL repair and peroneal tendon debridement preformed on 01/14/16. She says that her right ankle is doing well she's having no pain. She is able to wear regular shoe without any problems. Her only concern today is that she has noticed her left third through fifth toes start to drift medial. She denies any pain to this. Denies any systemic complaints such as fevers, chills, nausea, vomiting. No calf pain, chest pain, shortness of breath.   Objective: General: No acute distress, AAOx3  DP/PT pulses palpable 2/4, CRT < 3 sec to all digits.  Protective sensation intact. Motor function intact.  Right foot: Incision is well coapted without any evidence of dehiscence and a scar has formed  There is no surrounding erythema, ascending cellulitis,  no fluctuation or crepitus or any malodor there is no clinical signs of infection. On the left foot there is no area of tenderness. Her third to fifth digits have to get somewhat medially but there is no pain, swelling, redness or warmth. No other open lesions or pre-ulcerative lesions.  No pain with calf compression, swelling, warmth, erythema.   Assessment and Plan:  Status post right ankle surgery, doing well; deviation of the digits on the left  -Treatment options discussed including all alternatives, risks, and complications -I dispensed a toe separator to help hold the toes in the corrected position the left side. -At this time her right side is doing well. She is able to do regular activities and wearing a regular shoe without any problems and overall she states that she is very happy with the outcome of the surgery. I'm going to discharge her from her postoperative care. If any time she has any issues she was encouraged to call the office and she verbalized understanding. -Continue Ice/elevation.  -Monitor for any clinical signs or symptoms of  infection and DVT/PE and directed to call the office immediately should any occur or go to the ER. -Follow-up as scheduled or sooner if any problems arise. In the meantime, encouraged to call the office with any questions, concerns, change in symptoms.   [name], DPM

## 2016-04-14 ENCOUNTER — Other Ambulatory Visit: Payer: Self-pay | Admitting: Oral Surgery

## 2016-04-14 DIAGNOSIS — M26601 Right temporomandibular joint disorder, unspecified: Secondary | ICD-10-CM

## 2016-04-16 ENCOUNTER — Ambulatory Visit: Payer: BLUE CROSS/BLUE SHIELD | Admitting: Family Medicine

## 2016-04-22 ENCOUNTER — Ambulatory Visit
Admission: RE | Admit: 2016-04-22 | Discharge: 2016-04-22 | Disposition: A | Discharge status: Home / Self Care | Payer: BLUE CROSS/BLUE SHIELD | Source: Ambulatory Visit | Attending: Oral Surgery | Admitting: Oral Surgery

## 2016-04-22 DIAGNOSIS — M26601 Right temporomandibular joint disorder, unspecified: Secondary | ICD-10-CM | POA: Diagnosis not present

## 2016-05-05 ENCOUNTER — Encounter: Payer: Self-pay | Admitting: Family Medicine

## 2016-05-05 ENCOUNTER — Ambulatory Visit (INDEPENDENT_AMBULATORY_CARE_PROVIDER_SITE_OTHER): Payer: BLUE CROSS/BLUE SHIELD | Admitting: Family Medicine

## 2016-05-05 VITALS — BP 120/80 | HR 63 | Temp 97.9°F

## 2016-05-05 DIAGNOSIS — G2581 Restless legs syndrome: Secondary | ICD-10-CM

## 2016-05-05 DIAGNOSIS — Z6837 Body mass index (BMI) 37.0-37.9, adult: Secondary | ICD-10-CM | POA: Diagnosis not present

## 2016-05-05 DIAGNOSIS — E611 Iron deficiency: Secondary | ICD-10-CM | POA: Diagnosis not present

## 2016-05-05 DIAGNOSIS — E669 Obesity, unspecified: Secondary | ICD-10-CM | POA: Diagnosis not present

## 2016-05-05 DIAGNOSIS — M79672 Pain in left foot: Secondary | ICD-10-CM | POA: Diagnosis not present

## 2016-05-05 DIAGNOSIS — M25512 Pain in left shoulder: Secondary | ICD-10-CM | POA: Diagnosis not present

## 2016-05-05 DIAGNOSIS — M150 Primary generalized (osteo)arthritis: Secondary | ICD-10-CM | POA: Diagnosis not present

## 2016-05-05 DIAGNOSIS — M0589 Other rheumatoid arthritis with rheumatoid factor of multiple sites: Secondary | ICD-10-CM | POA: Diagnosis not present

## 2016-05-05 DIAGNOSIS — F5104 Psychophysiologic insomnia: Secondary | ICD-10-CM

## 2016-05-05 DIAGNOSIS — M797 Fibromyalgia: Secondary | ICD-10-CM | POA: Diagnosis not present

## 2016-05-05 LAB — FERRITIN: Ferritin: 8.7 ng/mL — ABNORMAL LOW (ref 10.0–291.0)

## 2016-05-05 MED ORDER — ESZOPICLONE 2 MG PO TABS
2.0000 mg | ORAL_TABLET | Freq: Every evening | ORAL | 0 refills | Status: DC | PRN
Start: 2016-05-05 — End: 2016-06-15

## 2016-05-05 NOTE — Progress Notes (Signed)
Subjective:     Patient ID: [name], female   DOB: 05/31/1955, 61 y.o.   MRN: [id number]  HPI Patient's seen for follow-up regarding the following issues  Restless leg syndrome. We increased her Mirapex to 0.5 mg. She also had low ferritin of 6.8 and is now on iron replacement and we plan to get follow-up ferritin level today. She still has some rest leg symptoms. She also is describing what sounds like possible myoclonus. She has sudden "jerks" that occur basically just with sleep mostly at night but occasionally with napping in the day as well. She has all been eliminated caffeine. No alcohol use.  Chronic insomnia. She has usually no difficulty falling asleep but usually wakes up around 3 AM and can't get back to sleep. For years took Ambien but that seemed quit working. Tried trazodone but she did not have much success. She was recently on Klonopin to try to help with restless leg symptoms but that did not seem to help with either restless legs or insomnia.  Recent right TMJ issues. We referred surgeon and she has planned injection tomorrow  Past Medical History:  Diagnosis Date  . ANGIOMA 10/03/2008   REMOVED FROM RIGHT LOWER LEG-BENIGN  . Anxiety   . Arthritis    RA  . Bronchitis    hx of   . Cancer (HCC)    BASAL CELL SKIN CANCER  . Complication of anesthesia   . Fibromyalgia   . GERD 08/27/2008  . History of blood transfusion    1995  . History of hiatal hernia    hx of has had surgically repaired  . History of measles   . History of mumps   . History of nonmelanoma skin cancer   . HYPERLIPIDEMIA 08/27/2008  . HYPERTENSION 08/27/2008   pt is currently on no meds   . Pneumonia    hx of   . PONV (postoperative nausea and vomiting)   . SEBORRHEIC KERATOSIS 08/27/2008   Past Surgical History:  Procedure Laterality Date  . ABDOMINAL HYSTERECTOMY  1995  . APPENDECTOMY    . BREAST DUCTAL SYSTEM EXCISION  08/16/2011   Procedure: EXCISION DUCTAL SYSTEM BREAST;  Surgeon:  Joyice Faster. Cornett, MD;  Location: [facility];  Service: General;  Laterality: Left;  left breast duct excision  . [place]   times 2; 1980 and 1982  . [place]  . CHOLECYSTECTOMY  1997  . EXCISION/RELEASE BURSA HIP Right 10/21/2014   Procedure: OPEN RIGHT HIP BURSECTOMY, EXOSECTOMY;  Surgeon: Paralee Cancel, MD;  [facility];  Service: Orthopedics;  Laterality: Right;  . foot surgery  2004, 2005, 2008   bilat secondary to neuropathy   . JOINT REPLACEMENT  10,12   lt total knee X2  . KNEE ARTHROSCOPY  2007, 2008, 2009  . LAPAROSCOPIC GASTRIC SLEEVE RESECTION N/A 10/30/2013   Procedure: LAPAROSCOPIC GASTRIC SLEEVE RESECTION WITH HIATAL HERNIA REPAIR AND UPPER ENDOSCOPY;  Surgeon: Greer Pickerel, MD;  [facility];  Service: General;  Laterality: N/A;  . NASAL SINUS SURGERY  2013  . OPEN SURGICAL REPAIR OF GLUTEAL TENDON  01/17/2012   Procedure: OPEN SURGICAL REPAIR OF GLUTEAL TENDON;  Surgeon: Mauri Pole, MD;  [facility];  Service: Orthopedics;  Laterality: Right;    reports that she quit smoking about 32 years ago. Her smoking use included Cigarettes. She has a 1.00 pack-year smoking history. She has never used smokeless tobacco. She reports that she  does not drink alcohol or use drugs. family history includes Cancer in her father, mother, and other; Diabetes in her other; Heart disease in her other; Hyperlipidemia in her other; Hypertension in her other; Kidney disease in her other. Allergies  Allergen Reactions  . Bee Venom Anaphylaxis  . Codeine Sulfate Other (See Comments)    GI upset  . Penicillins Rash     Review of Systems  Constitutional: Negative for appetite change and unexpected weight change.  Respiratory: Negative for shortness of breath.   Cardiovascular: Negative for chest pain.  Neurological: Negative for dizziness, syncope and weakness.  Psychiatric/Behavioral: Positive for sleep disturbance. Negative for  dysphoric mood.       Objective:   Physical Exam  Constitutional: She is oriented to person, place, and time. She appears well-developed and well-nourished.  Cardiovascular: Normal rate and regular rhythm.   Pulmonary/Chest: Effort normal and breath sounds normal. No respiratory distress. She has no wheezes. She has no rales.  Musculoskeletal: She exhibits no edema.  Neurological: She is alert and oriented to person, place, and time. No cranial nerve deficit.       Assessment:     #1 restless leg syndrome. Probably exacerbated by low iron stores.  #2 low ferritin a patient with prior gastric bypass surgery.  Suspect this is at least partly related to poor absorption. No obvious losses. Her colonoscopy is up-to-date.  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  #4 right TMJ    Plan:     -Repeat ferritin level today -Sleep hygiene has been discussed in detail -Short-term trial of Lunesta 2 mg daily at bedtime but try to avoid regular use -Consider neurology referral if myoclonus symptoms increase-Did not respond to clonazepam as above  Eulas Post MD [facility] at [facility]

## 2016-05-05 NOTE — Progress Notes (Signed)
Pre visit review using our clinic review tool, if applicable. No additional management support is needed unless otherwise documented below in the visit note. 

## 2016-05-05 NOTE — Patient Instructions (Addendum)
Myoclonus Myoclonus is a term that refers to brief, involuntary twitching or jerking of a muscle or a group of muscles. It describes a symptom, and generally, is not a diagnosis of a disease. The myoclonic twitches or jerks are usually caused by sudden muscle contractions. They also can result from brief lapses of contraction. Myoclonic twitches or jerks may occur:  Alone or in sequence.  In a pattern or without pattern.  Infrequently or many times each minute. Often times, myoclonus is one of several symptoms in a wide variety of nervous system disorders such as:  Multiple sclerosis.  Parkinson's disease.  Alzheimer's disease.  Creutzfeldt-Jakob disease. Familiar examples of normal myoclonus include:  Hiccups and jerks.  "Sleep starts" that some people have while drifting off to sleep. Severe cases can severely limit a person's ability to:  Eat.  Talk.  Walk. Myoclonic jerks commonly occur in individuals with epilepsy. The most common types of myoclonus include:  Action.  Cortical reflex.  Essential.  Palatal.  Progressive myoclonus epilepsy.  Reticular reflex.  Sleep.  Stimulus-sensitive. Treatment  Treatment for myoclonus consists of medicines that may help reduce symptoms. These drugs (many of which are also used to treat epilepsy) include:  Barbiturates.  Clonazepam.  Phenytoin.  Primidone.  Sodium valproate. The complex origins of myoclonus may require the use of multiple drugs.  This information is not intended to replace advice given to you by your health care provider. Make sure you discuss any questions you have with your health care provider. This information is not intended to replace advice given to you by your health care provider. Make sure you discuss any questions you have with your health care provider. Document Released: 01/29/2002 Document Revised: 01/23/2016 Document Reviewed: 01/11/2013 Elsevier Interactive Patient Education  2017  [employer] Restless Legs Syndrome Restless legs syndrome is a condition that causes uncomfortable feelings or sensations in the legs, especially while sitting or lying down. The sensations usually cause an overwhelming urge to move the legs. The arms can also sometimes be affected. The condition can range from mild to severe. The symptoms often interfere with a person's ability to sleep. What are the causes? The cause of this condition is not known. What increases the risk? This condition is more likely to develop in:  People who are older than age 46.  Pregnant women. In general, restless legs syndrome is more common in women than in men.  People who have a family history of the condition.  People who have certain medical conditions, such as iron deficiency, kidney disease, Parkinson disease, or nerve damage.  People who take certain medicines, such as medicines for high blood pressure, nausea, colds, allergies, depression, and some heart conditions. What are the signs or symptoms? The main symptom of this condition is uncomfortable sensations in the legs. These sensations may be:  Described as pulling, tingling, prickling, throbbing, crawling, or burning.  Worse while you are sitting or lying down.  Worse during periods of rest or inactivity.  Worse at night, often interfering with your sleep.  Accompanied by a very strong urge to move your legs.  Temporarily relieved by movement of your legs. The sensations usually affect both sides of the body. The arms can also be affected, but this is rare. People who have this condition often have tiredness during the day because of their lack of sleep at night. How is this diagnosed? This condition may be diagnosed based on your description of the symptoms. You may also have tests,  including blood tests, to check for other conditions that may lead to your symptoms. In some cases, you may be asked to spend some time in a sleep lab so your  sleeping can be monitored. How is this treated? Treatment for this condition is focused on managing the symptoms. Treatment may include:  Self-help and lifestyle changes.  Medicines. Follow these instructions at home:  Take medicines only as directed by your health care provider.  Try these methods to get temporary relief from the uncomfortable sensations:  Massage your legs.  Walk or stretch.  Take a cold or hot bath.  Practice good sleep habits. For example, go to bed and get up at the same time every day.  Exercise regularly.  Practice ways of relaxing, such as yoga or meditation.  Avoid caffeine and alcohol.  Do not use any tobacco products, including cigarettes, chewing tobacco, or electronic cigarettes. If you need help quitting, ask your health care provider.  Keep all follow-up visits as directed by your health care provider. This is important. Contact a health care provider if: Your symptoms do not improve with treatment, or they get worse. This information is not intended to replace advice given to you by your health care provider. Make sure you discuss any questions you have with your health care provider. Document Released: 01/29/2002 Document Revised: 07/17/2015 Document Reviewed: 02/04/2014 Elsevier Interactive Patient Education  2017 [employer].

## 2016-05-06 DIAGNOSIS — M26601 Right temporomandibular joint disorder, unspecified: Secondary | ICD-10-CM | POA: Diagnosis not present

## 2016-05-12 ENCOUNTER — Encounter: Payer: Self-pay | Admitting: Family Medicine

## 2016-05-14 ENCOUNTER — Other Ambulatory Visit: Payer: Self-pay | Admitting: Family Medicine

## 2016-05-14 DIAGNOSIS — R253 Fasciculation: Secondary | ICD-10-CM

## 2016-05-18 ENCOUNTER — Encounter: Payer: Self-pay | Admitting: Neurology

## 2016-05-18 ENCOUNTER — Other Ambulatory Visit: Payer: Self-pay | Admitting: Family Medicine

## 2016-05-18 ENCOUNTER — Encounter: Payer: Self-pay | Admitting: *Deleted

## 2016-05-18 ENCOUNTER — Telehealth: Payer: Self-pay | Admitting: Family Medicine

## 2016-05-18 DIAGNOSIS — R253 Fasciculation: Secondary | ICD-10-CM

## 2016-05-18 NOTE — Telephone Encounter (Signed)
Pt calling to check the status of

## 2016-05-18 NOTE — Telephone Encounter (Signed)
Note sent to patient  - mychart

## 2016-05-18 NOTE — Telephone Encounter (Signed)
The referral

## 2016-06-01 ENCOUNTER — Encounter (HOSPITAL_COMMUNITY): Payer: Self-pay

## 2016-06-09 NOTE — Progress Notes (Signed)
[name] was seen today in neurologic consultation at the request of Eulas Post, MD.  The consultation is for the evaluation of "jerking spells."    This patient is accompanied in the office by her spouse who supplements the history.  Pt states that sx's started 5-6 months ago and it started at night and would be when she was first going to bed at night and then might wake her up at night.  Her husband would notice that the bed would jerk at night.  She noted over the last few months it would be during the day intermittently.  It can happen if sitting or standing.  No new medications noted.    The patient has a very long history of restless leg syndrome.  I have reviewed numerous records about this.  She had a sleep study in 2014 that demonstrated periodic limb movement disorder and was started on pramipexole at that time.  She has worked her way up on the medication from 0.125 mg in 2014 to 0.5 mg at night currently.  Clonazepam was tried in the past up to 1 mg, but she did not find it particularly beneficial.  It was determined that she had iron deficiency anemia.  She is on oral iron supplement  Neuroimaging has previously been performed.  It is not available for my review today.   MRI brain was performed in 2002 and report notes that it was normal.     ALLERGIES:   Allergies  Allergen Reactions  . Bee Venom Anaphylaxis  . Codeine Sulfate Other (See Comments)    GI upset  . Penicillins Rash    CURRENT MEDICATIONS:  Outpatient Encounter Prescriptions as of 06/15/2016  Medication Sig  . acetaminophen (TYLENOL) 500 MG tablet Take 1,000 mg by mouth every 6 (six) hours as needed for pain.   . Biotin 1000 MCG tablet Take 3,000 mcg by mouth daily.  . calcium-vitamin D (OSCAL WITH D) 500-200 MG-UNIT per tablet Take 2 tablets by mouth 2 (two) times daily.  Marland Kitchen estradiol (ESTRACE) 1 MG tablet TAKE 1 TABLET BY MOUTH  DAILY  . etanercept (ENBREL) 50 MG/ML injection Inject 0.98 mLs (50  mg total) into the skin once a week. Resume end of next week (Patient taking differently: Inject 50 mg into the skin once a week. Friday)  . Ferrous Sulfate (IRON) 325 (65 Fe) MG TABS Take one tablet daily.  . folic acid (FOLVITE) 1 MG tablet Take 2 mg by mouth daily.   . methotrexate (50 MG/ML) 1 gm SOLR May resume previous dose at  end of next week (Patient taking differently: Inject 50 mg into the skin once a week. Friday)  . oxyCODONE-acetaminophen (PERCOCET) 10-325 MG tablet Take 1 tablet by mouth every 4 (four) hours as needed. for pain  . Pediatric Multiple Vit-C-FA (FLINSTONES GUMMIES OMEGA-3 DHA PO) Take 4 tablets by mouth daily.  . pramipexole (MIRAPEX) 0.5 MG tablet Take 1 tablet (0.5 mg total) by mouth at bedtime.  . predniSONE (DELTASONE) 5 MG tablet Take 5 mg by mouth daily with breakfast.  . promethazine (PHENERGAN) 25 MG tablet Take 25 mg by mouth every 8 (eight) hours as needed for nausea or vomiting.  . sertraline (ZOLOFT) 50 MG tablet TAKE ONE AND ONE- HALF  TABLET ONCE DAILY  . vitamin B-12 (CYANOCOBALAMIN) 1000 MCG tablet Take 1,000 mcg by mouth daily.  Marland Kitchen zolpidem (AMBIEN) 10 MG tablet Take 10 mg by mouth at bedtime as needed for sleep.  Marland Kitchen  EPIPEN 2-PAK 0.3 MG/0.3ML SOAJ injection Inject 0.3 mLs (0.3 mg total) into the muscle once. (Patient not taking: Reported on 06/15/2016)  . scopolamine (TRANSDERM-SCOP) 1 MG/3DAYS Place 1 patch (1.5 mg total) onto the skin every 3 (three) days. Behind ear (Patient not taking: Reported on 06/15/2016)  . [DISCONTINUED] eszopiclone (LUNESTA) 2 MG TABS tablet Take 1 tablet (2 mg total) by mouth at bedtime as needed for sleep. Take immediately before bedtime   No facility-administered encounter medications on file as of 06/15/2016.     PAST MEDICAL HISTORY:   Past Medical History:  Diagnosis Date  . ANGIOMA 10/03/2008   REMOVED FROM RIGHT LOWER LEG-BENIGN  . Anxiety   . Arthritis    RA  . Bronchitis    hx of   . Cancer (HCC)    BASAL CELL  SKIN CANCER  . Complication of anesthesia   . Fibromyalgia   . GERD 08/27/2008  . History of blood transfusion    1995  . History of hiatal hernia    hx of has had surgically repaired  . History of measles   . History of mumps   . History of nonmelanoma skin cancer   . HYPERLIPIDEMIA 08/27/2008  . HYPERTENSION 08/27/2008   pt is currently on no meds   . Pneumonia    hx of   . PONV (postoperative nausea and vomiting)   . SEBORRHEIC KERATOSIS 08/27/2008    PAST SURGICAL HISTORY:   Past Surgical History:  Procedure Laterality Date  . ABDOMINAL HYSTERECTOMY  1995  . APPENDECTOMY    . BREAST DUCTAL SYSTEM EXCISION  08/16/2011   Procedure: EXCISION DUCTAL SYSTEM BREAST;  Surgeon: Joyice Faster. Cornett, MD;  Location: [facility];  Service: General;  Laterality: Left;  left breast duct excision  . [place]   times 2; 1980 and 1982  . [place]  . CHOLECYSTECTOMY  1997  . EXCISION/RELEASE BURSA HIP Right 10/21/2014   Procedure: OPEN RIGHT HIP BURSECTOMY, EXOSECTOMY;  Surgeon: Paralee Cancel, MD;  Location: [facility];  Service: Orthopedics;  Laterality: Right;  . foot surgery  2004, 2005, 2008   bilat secondary to neuropathy   . JOINT REPLACEMENT  10,12   lt total knee X2  . KNEE ARTHROSCOPY  2007, 2008, 2009  . LAPAROSCOPIC GASTRIC SLEEVE RESECTION N/A 10/30/2013   Procedure: LAPAROSCOPIC GASTRIC SLEEVE RESECTION WITH HIATAL HERNIA REPAIR AND UPPER ENDOSCOPY;  Surgeon: Greer Pickerel, MD;  Location: [facility];  Service: General;  Laterality: N/A;  . NASAL SINUS SURGERY  2013  . OPEN SURGICAL REPAIR OF GLUTEAL TENDON  01/17/2012   Procedure: OPEN SURGICAL REPAIR OF GLUTEAL TENDON;  Surgeon: Mauri Pole, MD;  Location: [facility];  Service: Orthopedics;  Laterality: Right;    SOCIAL HISTORY:   Social History   Social History  . Marital status: Married    Spouse name: N/A  . Number of children: N/A  . Years of education: N/A   Occupational History  . Not on  file.   Social History Main Topics  . Smoking status: Former Smoker    Packs/day: 0.25    Years: 4.00    Types: Cigarettes    Quit date: 02/23/1984  . Smokeless tobacco: Never Used  . Alcohol use No  . Drug use: No  . Sexual activity: Not on file   Other Topics Concern  . Not on file   Social History Narrative  . No narrative on file  FAMILY HISTORY:   Family Status  Relation Status  . Mother Deceased  . Father Alive  . Sister Alive  . Brother Alive  . Sister Alive  . Son Alive  . Son Alive    ROS:  A complete 10 system review of systems was obtained and was unremarkable apart from what is mentioned above.  PHYSICAL EXAMINATION:    VITALS:   Vitals:   06/15/16 0835  BP: 134/80  Pulse: 66  SpO2: 96%  Weight: 219 lb (99.3 kg)  Height: 5\' 4"  (1.626 m)    GEN:  Normal appears female in no acute distress.  Appears stated age. HEENT:  Normocephalic, atraumatic. The mucous membranes are moist. The superficial temporal arteries are without ropiness or tenderness. Cardiovascular: Regular rate and rhythm. Lungs: Clear to auscultation bilaterally. Neck/Heme: There are no carotid bruits noted bilaterally.  NEUROLOGICAL: Orientation:  The patient is alert and oriented x 3.  Fund of knowledge is appropriate.  Recent and remote memory intact.  Attention span and concentration normal.  Repeats and names without difficulty. Cranial nerves: There is good facial symmetry. The pupils are equal round and reactive to light bilaterally. Fundoscopic exam reveals clear disc margins bilaterally. Extraocular muscles are intact and visual fields are full to confrontational testing. Speech is fluent and clear. Soft palate rises symmetrically and there is no tongue deviation. Hearing is intact to conversational tone. Tone: Tone is good throughout. Sensation: Sensation is intact to light touch and pinprick throughout (facial, trunk, extremities). Vibration is intact at the bilateral big toe.  There is no extinction with double simultaneous stimulation. There is no sensory dermatomal level identified. Coordination:  The patient has no difficulty with RAM's or FNF bilaterally. Motor: Strength is 5/5 in the bilateral upper and lower extremities.  Shoulder shrug is equal and symmetric. There is no pronator drift.  There are no fasciculations noted. DTR's: Deep tendon reflexes are 2/4 at the bilateral biceps, triceps, brachioradialis, patella and achilles.  Plantar responses are downgoing bilaterally. Gait and Station: The patient has minimal trouble ambulating in a tandem fashion. The patient is able to heel toe walk without any difficulty. The patient is able to ambulate in a tandem fashion. The patient is able to stand in the Romberg position. Abnormal movements:  Pt does occasionally have intermittent myoclonic like jerks during hx but they were entirely gone when distracted during examination.  Labs:  Lab Results  Component Value Date   FERRITIN 8.7 (L) 05/05/2016   Lab Results  Component Value Date   IRON 50 04/02/2013   TIBC 417 04/02/2013   FERRITIN 8.7 (L) 05/05/2016   Lab Results  Component Value Date   WBC 4.1 03/26/2016   HGB 11.4 (L) 03/26/2016   HCT 35.3 (L) 03/26/2016   MCV 84.5 03/26/2016   PLT 398.0 03/26/2016   Lab Results  Component Value Date   TSH 2.189 07/11/2013     Chemistry      Component Value Date/Time   NA 140 10/22/2014 0553   K 4.2 10/22/2014 0553   CL 106 10/22/2014 0553   CO2 27 10/22/2014 0553   BUN 13 10/22/2014 0553   CREATININE 0.56 10/22/2014 0553      Component Value Date/Time   CALCIUM 8.5 (L) 10/22/2014 0553   ALKPHOS 67 09/23/2014 1204   AST 17 09/23/2014 1204   ALT 13 09/23/2014 1204   BILITOT 0.4 09/23/2014 1204      Lab Results  Component Value Date   TSH 2.189  07/11/2013     IMPRESSION/PLAN  1.  Myoclonus  -this is usually due to medication and/or metabolic derangement but can be idiopathic and benign.  She  did have intermittent myoclonic like jerks during hx but they were entirely gone when distracted during examination so consideration could be given to nonphysiologic nature but other etiologies should be r/o.  -do TSH as hyperthyroidism can cause as can renal failure/insuff so will include chem.  Will do ammonia but didn't see negative myoclonus (asterixis) on examination.  -narcotic medication and SSRI's have been reported to cause myoclonus and she is on these, although not new.    2.  Restless leg syndrome  -This is likely due to her iron deficiency.  She is on oral iron replacement.  IV iron may be of value to see if the symptoms would resolve.  I talked to her about this today and she will discuss with PCP.  May not need pramipexole if iron deficiency completely treated.  3.  Will call her with results of above labs.  If negative, she will discuss above considerations with PCP.  If still having issues, is to make f/u appt for further w/u.  Much greater than 50% of this visit was spent in counseling and coordinating care.  Total face to face time:  60 min    Cc:  Eulas Post, MD

## 2016-06-15 ENCOUNTER — Ambulatory Visit (INDEPENDENT_AMBULATORY_CARE_PROVIDER_SITE_OTHER): Payer: BLUE CROSS/BLUE SHIELD | Admitting: Neurology

## 2016-06-15 ENCOUNTER — Encounter: Payer: Self-pay | Admitting: Neurology

## 2016-06-15 ENCOUNTER — Other Ambulatory Visit (INDEPENDENT_AMBULATORY_CARE_PROVIDER_SITE_OTHER): Payer: BLUE CROSS/BLUE SHIELD

## 2016-06-15 VITALS — BP 134/80 | HR 66 | Ht 64.0 in | Wt 219.0 lb

## 2016-06-15 DIAGNOSIS — G253 Myoclonus: Secondary | ICD-10-CM | POA: Diagnosis not present

## 2016-06-15 DIAGNOSIS — G2581 Restless legs syndrome: Secondary | ICD-10-CM

## 2016-06-15 DIAGNOSIS — Z5181 Encounter for therapeutic drug level monitoring: Secondary | ICD-10-CM

## 2016-06-15 DIAGNOSIS — D508 Other iron deficiency anemias: Secondary | ICD-10-CM

## 2016-06-15 LAB — TSH: TSH: 2.18 mIU/L

## 2016-06-15 LAB — AMMONIA: Ammonia: 28 umol/L (ref 11–35)

## 2016-06-15 NOTE — Patient Instructions (Signed)
1. Your provider has requested that you have labwork completed today. Please go to [facility] ([phone]) on the second floor of this building before leaving the office today. You do not need to check in. If you are not called within 15 minutes please check with the front desk.   

## 2016-06-16 ENCOUNTER — Telehealth: Payer: Self-pay | Admitting: Neurology

## 2016-06-16 LAB — COMPREHENSIVE METABOLIC PANEL
ALT: 9 U/L (ref 6–29)
AST: 15 U/L (ref 10–35)
Albumin: 3.5 g/dL — ABNORMAL LOW (ref 3.6–5.1)
Alkaline Phosphatase: 90 U/L (ref 33–130)
BUN: 18 mg/dL (ref 7–25)
CO2: 25 mmol/L (ref 20–31)
Calcium: 9 mg/dL (ref 8.6–10.4)
Chloride: 108 mmol/L (ref 98–110)
Creat: 0.56 mg/dL (ref 0.50–0.99)
Glucose, Bld: 83 mg/dL (ref 65–99)
Potassium: 4.2 mmol/L (ref 3.5–5.3)
Sodium: 142 mmol/L (ref 135–146)
Total Bilirubin: 0.3 mg/dL (ref 0.2–1.2)
Total Protein: 5.8 g/dL — ABNORMAL LOW (ref 6.1–8.1)

## 2016-06-16 NOTE — Telephone Encounter (Signed)
Let pt know that labs we drew yesterday generally look okay and no etiology seen for movements that [name] describes.  Only meds I see on her list that could be causitive are pain med (percocet) and antidepressant (zoloft).  I don't recommend [name] change these on her own but talk with prescribing physician about them perhaps.  [name] and I discussed yesterday the role of iron in the RLS already and I sent note to her PCP

## 2016-06-16 NOTE — Telephone Encounter (Signed)
Left message on machine for patient to call back.

## 2016-06-16 NOTE — Telephone Encounter (Signed)
Patient left message on the voice mail returning [place] call

## 2016-06-16 NOTE — Telephone Encounter (Signed)
Patient made aware. Lab results sent to PCP.

## 2016-06-18 ENCOUNTER — Encounter: Payer: Self-pay | Admitting: Family Medicine

## 2016-06-21 ENCOUNTER — Encounter: Payer: Self-pay | Admitting: Family Medicine

## 2016-06-21 DIAGNOSIS — D509 Iron deficiency anemia, unspecified: Secondary | ICD-10-CM

## 2016-06-21 MED ORDER — ZOLPIDEM TARTRATE 10 MG PO TABS: 10.0000 mg | ORAL_TABLET | Freq: Every evening | ORAL | 0 refills | Status: DC | PRN

## 2016-06-23 ENCOUNTER — Telehealth: Payer: Self-pay | Admitting: *Deleted

## 2016-06-23 MED ORDER — ZOLPIDEM TARTRATE 10 MG PO TABS: 10.0000 mg | ORAL_TABLET | Freq: Every evening | ORAL | 0 refills | Status: DC | PRN

## 2016-06-23 NOTE — Telephone Encounter (Signed)
#  [phone] Ambien faxed to [facility].  #30 prescription has been discarded.  Local pharmacy aware.

## 2016-06-28 ENCOUNTER — Encounter: Payer: Self-pay | Admitting: Family Medicine

## 2016-07-08 ENCOUNTER — Ambulatory Visit (INDEPENDENT_AMBULATORY_CARE_PROVIDER_SITE_OTHER): Payer: BLUE CROSS/BLUE SHIELD | Admitting: Podiatry

## 2016-07-08 ENCOUNTER — Encounter: Payer: Self-pay | Admitting: Podiatry

## 2016-07-08 DIAGNOSIS — B070 Plantar wart: Secondary | ICD-10-CM | POA: Diagnosis not present

## 2016-07-09 ENCOUNTER — Ambulatory Visit (HOSPITAL_COMMUNITY): Admission: RE | Admit: 2016-07-09 | Payer: BLUE CROSS/BLUE SHIELD | Source: Ambulatory Visit

## 2016-07-09 NOTE — Progress Notes (Signed)
Subjective: 61 year old female presents the office today for concerns of a painful lesion to the left second toe which has been ongoing for about 1 month. She states the toe is painful. She denies any recent injury or trauma. No swelling or redness. No numbness or tingling. No recent injury. She states the surgery sites are doing well she's having no problems. Denies any systemic complaints such as fevers, chills, nausea, vomiting. No acute changes since last appointment, and no other complaints at this time.   Objective: AAO x3, NAD DP/PT pulses palpable bilaterally, CRT less than 3 seconds At the distal aspect the left second toe is a hyperkeratotic lesion. Upon debridement there is pinpoint bleeding evidence of verruca. There is no swelling erythema, edema, ascending cellulitis. There is no drainage or pus expressed. Hammertoe contractures present. No open lesions or pre-ulcerative lesions.  No pain with calf compression, swelling, warmth, erythema  Assessment: Left second digit verruca  Plan: -All treatment options discussed with the patient including all alternatives, risks, complications.  -Lesion sharply debrided without complications or bleeding. The areas clean followed by a pad and salinocaine. Post procedure instructions were discussed. Monitor for infection. -RTC 3 weeks or sooner if needed. -Patient encouraged to call the office with any questions, concerns, change in symptoms.   [name], DPM

## 2016-07-21 ENCOUNTER — Ambulatory Visit (HOSPITAL_COMMUNITY): Admission: RE | Admit: 2016-07-21 | Payer: BLUE CROSS/BLUE SHIELD | Source: Ambulatory Visit

## 2016-07-21 ENCOUNTER — Ambulatory Visit (HOSPITAL_COMMUNITY)
Admission: RE | Admit: 2016-07-21 | Discharge: 2016-07-21 | Disposition: A | Discharge status: Home / Self Care | Payer: BLUE CROSS/BLUE SHIELD | Source: Ambulatory Visit | Attending: Family Medicine | Admitting: Family Medicine

## 2016-07-21 ENCOUNTER — Encounter (HOSPITAL_COMMUNITY): Payer: Self-pay

## 2016-07-21 DIAGNOSIS — D649 Anemia, unspecified: Secondary | ICD-10-CM | POA: Diagnosis not present

## 2016-07-21 MED ORDER — FERUMOXYTOL INJECTION 510 MG/17 ML
510.0000 mg | Freq: Once | INTRAVENOUS | Status: AC
  Administered 2016-07-21: 510 mg via INTRAVENOUS
  Filled 2016-07-21: qty 17

## 2016-07-21 MED ORDER — SODIUM CHLORIDE 0.9 % IV SOLN
Freq: Once | INTRAVENOUS | Status: AC
  Administered 2016-07-21: 08:00:00 via INTRAVENOUS

## 2016-07-21 NOTE — Progress Notes (Signed)
Patient had IV fereheme and tolerated well.

## 2016-07-21 NOTE — Discharge Instructions (Signed)
Call MD for any problems or questions. Call 911 for emergencies.       Feraheme Ferumoxytol injection What is this medicine? FERUMOXYTOL is an iron complex. Iron is used to make healthy red blood cells, which carry oxygen and nutrients throughout the body. This medicine is used to treat iron deficiency anemia in people with chronic kidney disease. This medicine may be used for other purposes; ask your health care provider or pharmacist if you have questions. COMMON BRAND NAME(S): Feraheme What should I tell my health care provider before I take this medicine? They need to know if you have any of these conditions: -anemia not caused by low iron levels -high levels of iron in the blood -magnetic resonance imaging (MRI) test scheduled -an unusual or allergic reaction to iron, other medicines, foods, dyes, or preservatives -pregnant or trying to get pregnant -breast-feeding How should I use this medicine? This medicine is for injection into a vein. It is given by a health care professional in a hospital or clinic setting. Talk to your pediatrician regarding the use of this medicine in children. Special care may be needed. Overdosage: If you think you have taken too much of this medicine contact a poison control center or emergency room at once. NOTE: This medicine is only for you. Do not share this medicine with others. What if I miss a dose? It is important not to miss your dose. Call your doctor or health care professional if you are unable to keep an appointment. What may interact with this medicine? This medicine may interact with the following medications: -other iron products This list may not describe all possible interactions. Give your health care provider a list of all the medicines, herbs, non-prescription drugs, or dietary supplements you use. Also tell them if you smoke, drink alcohol, or use illegal drugs. Some items may interact with your medicine. What should I watch for  while using this medicine? Visit your doctor or healthcare professional regularly. Tell your doctor or healthcare professional if your symptoms do not start to get better or if they get worse. You may need blood work done while you are taking this medicine. You may need to follow a special diet. Talk to your doctor. Foods that contain iron include: whole grains/cereals, dried fruits, beans, or peas, leafy green vegetables, and organ meats (liver, kidney). What side effects may I notice from receiving this medicine? Side effects that you should report to your doctor or health care professional as soon as possible: -allergic reactions like skin rash, itching or hives, swelling of the face, lips, or tongue -breathing problems -changes in blood pressure -feeling faint or lightheaded, falls -fever or chills -flushing, sweating, or hot feelings -swelling of the ankles or feet Side effects that usually do not require medical attention (report to your doctor or health care professional if they continue or are bothersome): -diarrhea -headache -nausea, vomiting -stomach pain This list may not describe all possible side effects. Call your doctor for medical advice about side effects. You may report side effects to FDA at 1-800-FDA-1088. Where should I keep my medicine? This drug is given in a hospital or clinic and will not be stored at home. NOTE: This sheet is a summary. It may not cover all possible information. If you have questions about this medicine, talk to your doctor, pharmacist, or health care provider.  2018 Elsevier/Gold Standard (2015-03-13 12:41:49)

## 2016-07-29 ENCOUNTER — Ambulatory Visit (INDEPENDENT_AMBULATORY_CARE_PROVIDER_SITE_OTHER): Payer: BLUE CROSS/BLUE SHIELD | Admitting: Podiatry

## 2016-07-29 ENCOUNTER — Telehealth: Payer: Self-pay | Admitting: *Deleted

## 2016-07-29 ENCOUNTER — Encounter: Payer: Self-pay | Admitting: Podiatry

## 2016-07-29 ENCOUNTER — Ambulatory Visit (INDEPENDENT_AMBULATORY_CARE_PROVIDER_SITE_OTHER): Payer: Medicare Other

## 2016-07-29 DIAGNOSIS — M21629 Bunionette of unspecified foot: Secondary | ICD-10-CM

## 2016-07-29 DIAGNOSIS — M898X9 Other specified disorders of bone, unspecified site: Secondary | ICD-10-CM | POA: Diagnosis not present

## 2016-07-29 DIAGNOSIS — M2012 Hallux valgus (acquired), left foot: Secondary | ICD-10-CM

## 2016-07-29 DIAGNOSIS — B070 Plantar wart: Secondary | ICD-10-CM

## 2016-07-29 DIAGNOSIS — Z9889 Other specified postprocedural states: Secondary | ICD-10-CM

## 2016-07-29 NOTE — Patient Instructions (Signed)

## 2016-07-29 NOTE — Telephone Encounter (Signed)
"  I was in to see Dr. Jacqualyn Posey and I scheduled surgery for 08/04/2016.  They told me to call you to see if this date was okay."  Yes, I have you scheduled.

## 2016-08-01 NOTE — Progress Notes (Signed)
Subjective: [name] presents the office if up evaluation of left second digit wart. She states that the area still there but it has improved somewhat. She states that she gets some pain along the second toe. The majority pain that she is experiencing sore left foot and she points the lateral aspect of the fifth metatarsal head remnant. This has been ongoing for some time but has been worsening. We discussed this previously. She is tried offloading and padding without any significant improvement she is inquiring about any other surgical intervention of the knee warmth to the help decrease the pressure so that way she can wear shoes or comfortably. Denies any systemic complaints such as fevers, chills, nausea, vomiting. No acute changes since last appointment, and no other complaints at this time.   Objective: AAO x3, NAD DP/PT pulses palpable bilaterally, CRT less than 3 seconds To the distal aspect of the left second digit is a hyperkeratotic lesion with evidence of verruca. There is no swelling edema, erythema, drainage or pus. No clinical signs of infection.  On the left foot along the lateral aspect of the fifth metatarsal remnant there is prominence consistent with what appears to be a Taylor's bunion. There is tenderness to the lateral aspect of the fifth metatarsal head remnant distally. There is mild irritation from shoe gear. There is no open sores identified. She states that her right foot/ankle is doing well she's having no issues. No open lesions or pre-ulcerative lesions.  No pain with calf compression, swelling, warmth, erythema  Assessment: Left foot Taylor's bunion, prominent bone with history of previous fifth metatarsal head resection; left second toe verruca  Plan: -All treatment options discussed with the patient including all alternatives, risks, complications.  -X-rays obtained and reviewed. History of previous metatarsal head resection. There is lateral deviation of the  fifth metatarsal consistent with a Taylor's bunion prominence. No evidence of acute fracture. -Further was sharply debrided state. The areas clean and a pad was placed followed by salicylic acid and a bandage. Post procedure instructions were discussed. Monitor for infection. -In regards to the fifth metatarsal pain, we discussed both conservative and surgical treatment options. This time she wishes to proceed with surgical intervention. I discussed with her exostectomy fifth metatarsal head as well as removal of the small portion of the bone distally. However this will help limit the pressure and pain. I discussed with her this is not a guarantee resolution of symptoms. She understands and wishes to proceed. At this time, the wart is still presently going to excise this. -The incision placement as well as the postoperative course was discussed with the patient. I discussed risks of the surgery which include, but not limited to, infection, bleeding, pain, swelling, need for further surgery, delayed or nonhealing, painful or ugly scar, numbness or sensation changes, over/under correction, recurrence, transfer lesions, further deformity, hardware failure, DVT/PE, loss of toe/foot. Patient understands these risks and wishes to proceed with surgery. The surgical consent was reviewed with the patient all 3 pages were signed. No promises or guarantees were given to the outcome of the procedure. All questions were answered to the best of my ability. Before the surgery the patient was encouraged to call the office if there is any further questions. The surgery will be performed at the [facility] on an outpatient basis. -Patient encouraged to call the office with any questions, concerns, change in symptoms.   [name], DPM

## 2016-08-04 ENCOUNTER — Encounter: Payer: Self-pay | Admitting: Podiatry

## 2016-08-04 DIAGNOSIS — M21622 Bunionette of left foot: Secondary | ICD-10-CM | POA: Diagnosis not present

## 2016-08-04 DIAGNOSIS — M722 Plantar fascial fibromatosis: Secondary | ICD-10-CM | POA: Diagnosis not present

## 2016-08-04 DIAGNOSIS — M779 Enthesopathy, unspecified: Secondary | ICD-10-CM | POA: Diagnosis not present

## 2016-08-04 DIAGNOSIS — M2012 Hallux valgus (acquired), left foot: Secondary | ICD-10-CM | POA: Diagnosis not present

## 2016-08-04 DIAGNOSIS — D509 Iron deficiency anemia, unspecified: Secondary | ICD-10-CM | POA: Diagnosis not present

## 2016-08-04 DIAGNOSIS — B070 Plantar wart: Secondary | ICD-10-CM | POA: Diagnosis not present

## 2016-08-09 ENCOUNTER — Ambulatory Visit (INDEPENDENT_AMBULATORY_CARE_PROVIDER_SITE_OTHER): Payer: Self-pay | Admitting: Podiatry

## 2016-08-09 ENCOUNTER — Ambulatory Visit: Payer: Medicare Other

## 2016-08-09 ENCOUNTER — Ambulatory Visit (INDEPENDENT_AMBULATORY_CARE_PROVIDER_SITE_OTHER): Payer: Medicare Other

## 2016-08-09 DIAGNOSIS — M21629 Bunionette of unspecified foot: Secondary | ICD-10-CM

## 2016-08-09 DIAGNOSIS — B070 Plantar wart: Secondary | ICD-10-CM

## 2016-08-11 ENCOUNTER — Encounter: Payer: Self-pay | Admitting: Family Medicine

## 2016-08-11 ENCOUNTER — Encounter: Payer: Self-pay | Admitting: Podiatry

## 2016-08-11 NOTE — Progress Notes (Signed)
Subjective: [name] is a 61 y.o. is seen today in office s/p left 5th metatarsal excision and wart excision of her left 2nd digit preformed on 08/04/2016. They state their pain is controlled but she does state that the surgical site is sore. She has remained in the surgical boot. She has continued on antibiotics. She states that she's been changing the bandage daily to the second toe for the wart. Denies any drainage or pus.  Denies any systemic complaints such as fevers, chills, nausea, vomiting. No calf pain, chest pain, shortness of breath.   Objective: General: No acute distress, AAOx3  DP/PT pulses palpable 2/4, CRT < 3 sec to all digits.  Protective sensation intact. Motor function intact.  Left foot: Incision is well coapted without any evidence of dehiscence and sutures are intact. There is no surrounding erythema, ascending cellulitis, fluctuance, crepitus, malodor, drainage/purulence. There is mild edema around the surgical site. There is mild pain along the surgical siteto the fifth metatarsal. There is minimal tenderness of the second toe and the wound appears to be healing with granulation tissue. There is no surrounding erythema, edema. There is no drainage or pus expressed. No ascending synovitis. No other areas of tenderness to bilateral lower extremities.  No other open lesions or pre-ulcerative lesions.  No pain with calf compression, swelling, warmth, erythema.   Assessment and Plan:  Status post left fifth metatarsal head excision verruca excision left second toe, doing well with no complications   -Treatment options discussed including all alternatives, risks, and complications -X-rays were obtained and reviewed which revealed evidence of recent resection of the fifth metatarsal head. No evidence of acute fracture. -Antibiotic ointment was applied followed by a bandage. She continue with daily dressing changes of the second toe. -Continue surgical  boot. -Ice/elevation -Pain medication as needed. -Monitor for any clinical signs or symptoms of infection and DVT/PE and directed to call the office immediately should any occur or go to the ER. -Follow-up in 1 week  or sooner if any problems arise. In the meantime, encouraged to call the office with any questions, concerns, change in symptoms.   [name], DPM

## 2016-08-13 ENCOUNTER — Other Ambulatory Visit (HOSPITAL_COMMUNITY): Payer: BLUE CROSS/BLUE SHIELD

## 2016-08-13 MED ORDER — ZOLPIDEM TARTRATE 10 MG PO TABS: 10.0000 mg | ORAL_TABLET | Freq: Every evening | ORAL | 0 refills | Status: DC | PRN

## 2016-08-16 ENCOUNTER — Encounter: Payer: Medicare Other | Admitting: Podiatry

## 2016-08-19 ENCOUNTER — Encounter: Payer: Self-pay | Admitting: Podiatry

## 2016-08-19 ENCOUNTER — Ambulatory Visit (INDEPENDENT_AMBULATORY_CARE_PROVIDER_SITE_OTHER): Payer: Medicare Other | Admitting: Podiatry

## 2016-08-19 DIAGNOSIS — M21629 Bunionette of unspecified foot: Secondary | ICD-10-CM

## 2016-08-19 DIAGNOSIS — B070 Plantar wart: Secondary | ICD-10-CM

## 2016-08-19 NOTE — Progress Notes (Signed)
Subjective: [name] is a 61 y.o. is seen today in office s/p left 5th metatarsal excision and wart excision of her left 2nd digit preformed on 08/04/2016. She presents today for suture removal. She states that she is doing well. She still gets some discomfort [place] outside hospital foot on surgical site but this has improved. She's been in a surgical shoe. She has continued to clean the wart area with antibody ointment and a bandage daily. She denies any drainage or pus. She has no other concerns today. Denies any systemic complaints such as fevers, chills, nausea, vomiting. No calf pain, chest pain, shortness of breath.   Objective: General: No acute distress, AAOx3  DP/PT pulses palpable 2/4, CRT < 3 sec to all digits.  Protective sensation intact. Motor function intact.  Left foot: Incision is well coapted without any evidence of dehiscence and sutures are intact. There is no surrounding erythema, ascending cellulitis, fluctuance, crepitus, malodor, drainage/purulence. There is mild edema around the surgical site which does continue but is improved. There is mild pain along the surgical site to the fifth metatarsal but again improved. There is minimal tenderness of the second toe and the wound appears to be healing with granulation tissue and is almost healed. There is no surrounding erythema, edema. There is no drainage or pus expressed. No ascending synovitis. No other areas of tenderness to bilateral lower extremities.  No other open lesions or pre-ulcerative lesions.  No pain with calf compression, swelling, warmth, erythema.   Assessment and Plan:  Status post left fifth metatarsal head excision verruca excision left second toe, doing well with no complications   -Treatment options discussed including all alternatives, risks, and complications -Half of the sutures removed today without complications. The left half intact to ensure adequate healing. And buttock when it was applied  followed by a bandage. The dressing clean, dry, intact for now but she gets her 2 days to change the bandage daily. -Continue antibiotic ointment to the second toe daily bandage. -Remaining surgical shoe. -Ice and elevation. -Pain medication as needed- she has not been needing this.  -Monitor for any clinical signs or symptoms of infection and DVT/PE and directed to call the office immediately should any occur or go to the ER. -Follow-up in 10 days  or sooner if any problems arise. In the meantime, encouraged to call the office with any questions, concerns, change in symptoms.   [name], DPM

## 2016-08-20 ENCOUNTER — Encounter: Admission: RE | Payer: Self-pay | Source: Ambulatory Visit

## 2016-08-20 ENCOUNTER — Ambulatory Visit: Admission: RE | Admit: 2016-08-20 | Payer: BLUE CROSS/BLUE SHIELD | Source: Ambulatory Visit | Admitting: Oral Surgery

## 2016-08-20 SURGERY — ARTHROTOMY
Anesthesia: General

## 2016-08-20 NOTE — Progress Notes (Signed)
DOS 06.13.2018 Left foot excision of bone spur from 5th (fifth) metatarsal, wart excision 2nd (second) toe, steroid injection left foot.

## 2016-08-30 ENCOUNTER — Ambulatory Visit: Payer: Medicare Other

## 2016-09-02 DIAGNOSIS — M25561 Pain in right knee: Secondary | ICD-10-CM | POA: Diagnosis not present

## 2016-09-03 ENCOUNTER — Encounter: Payer: Self-pay | Admitting: Podiatry

## 2016-09-03 ENCOUNTER — Ambulatory Visit (INDEPENDENT_AMBULATORY_CARE_PROVIDER_SITE_OTHER): Payer: Self-pay | Admitting: Podiatry

## 2016-09-03 DIAGNOSIS — M21629 Bunionette of unspecified foot: Secondary | ICD-10-CM

## 2016-09-03 DIAGNOSIS — M7501 Adhesive capsulitis of right shoulder: Secondary | ICD-10-CM | POA: Diagnosis not present

## 2016-09-03 DIAGNOSIS — B070 Plantar wart: Secondary | ICD-10-CM

## 2016-09-03 DIAGNOSIS — M898X9 Other specified disorders of bone, unspecified site: Secondary | ICD-10-CM

## 2016-09-06 NOTE — Progress Notes (Signed)
Subjective: [name] is a 61 y.o. is seen today in office s/p left 5th metatarsal excision and wart excision of her left 2nd digit preformed on 08/04/2016. She presents today to remove the remainder of the sutures. She states that overall she is doing well and her pain is improved as well as the swelling. She's continue to clean the area on the toe with a wart was taken out. She is remaining surgical shoe. Denies any systemic complaints such as fevers, chills, nausea, vomiting. No calf pain, chest pain, shortness of breath.   Objective: General: No acute distress, AAOx3  DP/PT pulses palpable 2/4, CRT < 3 sec to all digits.  Protective sensation intact. Motor function intact.  Left foot: Incision is well coapted without any evidence of dehiscence and sutures are intact. There is no surrounding erythema, ascending cellulitis, fluctuance, crepitus, malodor, drainage/purulence. There is decreased edema around the surgical site. There is decreased pain along the surgical site to the fifth metatarsal. She states "it is not that bad". There is minimal tenderness of the second toe and the wound appears to be healing with granulation tissue and is almost healed. There does remain a superficial wound present. There is no drainage or pus expressed there is no clinical signs of infection. There is no surrounding erythema, edema. There is no drainage or pus expressed. No ascending synovitis. No other areas of tenderness to bilateral lower extremities.  No other open lesions or pre-ulcerative lesions.  No pain with calf compression, swelling, warmth, erythema.   Assessment and Plan:  Status post left fifth metatarsal head excision verruca excision left second toe, doing well with no complications   -Treatment options discussed including all alternatives, risks, and complications -Remainder the sutures removed today without any complications. After removal the incision remained well coapted. Antibiotic  ointment and a bandage was applied. She conservative to shower tomorrow continue with daily dressing changes. Continue daily dressing changes to the second toe as well. This is almost healed. She inserted transition to regular shoe as tolerated. Continue ice and elevation and gradual increase in activity.  [name], DPM

## 2016-09-09 DIAGNOSIS — M150 Primary generalized (osteo)arthritis: Secondary | ICD-10-CM | POA: Diagnosis not present

## 2016-09-09 DIAGNOSIS — E669 Obesity, unspecified: Secondary | ICD-10-CM | POA: Diagnosis not present

## 2016-09-09 DIAGNOSIS — R1013 Epigastric pain: Secondary | ICD-10-CM | POA: Diagnosis not present

## 2016-09-09 DIAGNOSIS — M25512 Pain in left shoulder: Secondary | ICD-10-CM | POA: Diagnosis not present

## 2016-09-09 DIAGNOSIS — R5383 Other fatigue: Secondary | ICD-10-CM | POA: Diagnosis not present

## 2016-09-09 DIAGNOSIS — R5381 Other malaise: Secondary | ICD-10-CM | POA: Diagnosis not present

## 2016-09-09 DIAGNOSIS — M0589 Other rheumatoid arthritis with rheumatoid factor of multiple sites: Secondary | ICD-10-CM | POA: Diagnosis not present

## 2016-09-09 DIAGNOSIS — M79672 Pain in left foot: Secondary | ICD-10-CM | POA: Diagnosis not present

## 2016-09-09 DIAGNOSIS — Z6836 Body mass index (BMI) 36.0-36.9, adult: Secondary | ICD-10-CM | POA: Diagnosis not present

## 2016-09-09 DIAGNOSIS — M797 Fibromyalgia: Secondary | ICD-10-CM | POA: Diagnosis not present

## 2016-09-23 DIAGNOSIS — K5904 Chronic idiopathic constipation: Secondary | ICD-10-CM | POA: Diagnosis not present

## 2016-09-23 DIAGNOSIS — Z800 Family history of malignant neoplasm of digestive organs: Secondary | ICD-10-CM | POA: Diagnosis not present

## 2016-09-23 DIAGNOSIS — R194 Change in bowel habit: Secondary | ICD-10-CM | POA: Diagnosis not present

## 2016-10-01 ENCOUNTER — Ambulatory Visit: Payer: Medicare Other | Admitting: Podiatry

## 2016-10-14 ENCOUNTER — Ambulatory Visit (INDEPENDENT_AMBULATORY_CARE_PROVIDER_SITE_OTHER): Payer: Medicare Other | Admitting: Podiatry

## 2016-10-14 ENCOUNTER — Encounter: Payer: Self-pay | Admitting: Podiatry

## 2016-10-14 ENCOUNTER — Ambulatory Visit (INDEPENDENT_AMBULATORY_CARE_PROVIDER_SITE_OTHER): Payer: Medicare Other

## 2016-10-14 DIAGNOSIS — L905 Scar conditions and fibrosis of skin: Secondary | ICD-10-CM

## 2016-10-14 DIAGNOSIS — M2012 Hallux valgus (acquired), left foot: Secondary | ICD-10-CM

## 2016-10-14 DIAGNOSIS — K5904 Chronic idiopathic constipation: Secondary | ICD-10-CM | POA: Diagnosis not present

## 2016-10-14 DIAGNOSIS — M779 Enthesopathy, unspecified: Secondary | ICD-10-CM | POA: Diagnosis not present

## 2016-10-15 NOTE — Progress Notes (Signed)
Subjective: 61 year old female presents the office they for follow-up evaluation of left foot pain. She states the onset of her foot is doing better but the majority pain she points is along what would be the second MPJ. She denies any recent injury or trauma his been ongoing since prior to surgery. She says it hurts she tries to walk. Denies any recent swelling or redness to the area that she has noticed. Denies any systemic complaints such as fevers, chills, nausea, vomiting. No acute changes since last appointment, and no other complaints at this time.   Objective: AAO x3, NAD DP/PT pulses palpable bilaterally, CRT less than 3 seconds Incisions and the prior surgery all well-healed. There is mild swelling to the fifth metatarsal area from the prior surgery but is no erythema or increase in warmth. There is no areas of questions or crepitus is no malodor. There is no clinical signs of infection present otherwise. The majority tenderness appears to be localized to low of the second MPJ and the left foot. There doesn't be a scar contracture present at this area. There is no other area of tenderness there is no area pinpoint bony tenderness. No open lesions or pre-ulcerative lesions.  No pain with calf compression, swelling, warmth, erythema  Assessment: Scar tissue, tendinitis left foot  Plan: -All treatment options discussed with the patient including all alternatives, risks, complications.  -X-rays were obtained and reviewed. There has been to be secondary bone formation present the fifth metatarsal base. Is no clinical signs of infection noted on exam today. -Discussed a steroid injection in the area of tenderness and she wished to proceed with this. Under standard conditions mature Kenalog CYCLES INFILTRATED INTO THE AREA OF MAXIMAL TENDERNESS INTO A WOULD BE THE SECOND MPJ WITHOUT COMPLICATIONS. POST INJECTION CARE WAS DISCUSSED. ICE AND ELEVATION. -Also she is wearing very flat shoes.  Discussed with her more supportive shoe as well as orthotic. -I will give her some time to see her back the next 4-6 weeks if needed otherwise that she was doing well to push up the appointment and to call as needed. -Patient encouraged to call the office with any questions, concerns, change in symptoms.   [name], DPM

## 2016-10-28 ENCOUNTER — Other Ambulatory Visit: Payer: Self-pay | Admitting: Family Medicine

## 2016-10-28 DIAGNOSIS — Z1231 Encounter for screening mammogram for malignant neoplasm of breast: Secondary | ICD-10-CM

## 2016-11-11 ENCOUNTER — Encounter: Payer: Self-pay | Admitting: Family Medicine

## 2016-11-12 ENCOUNTER — Ambulatory Visit: Payer: BLUE CROSS/BLUE SHIELD

## 2016-11-12 ENCOUNTER — Ambulatory Visit
Admission: RE | Admit: 2016-11-12 | Discharge: 2016-11-12 | Disposition: A | Discharge status: Home / Self Care | Payer: BLUE CROSS/BLUE SHIELD | Source: Ambulatory Visit | Attending: Family Medicine | Admitting: Family Medicine

## 2016-11-12 DIAGNOSIS — Z1231 Encounter for screening mammogram for malignant neoplasm of breast: Secondary | ICD-10-CM

## 2016-11-21 ENCOUNTER — Other Ambulatory Visit: Payer: Self-pay | Admitting: Family Medicine

## 2016-11-22 ENCOUNTER — Other Ambulatory Visit: Payer: Self-pay | Admitting: Family Medicine

## 2016-11-23 ENCOUNTER — Encounter: Payer: Self-pay | Admitting: Family Medicine

## 2016-11-23 MED ORDER — ZOLPIDEM TARTRATE 10 MG PO TABS: 10.0000 mg | ORAL_TABLET | Freq: Every evening | ORAL | 0 refills | Status: DC | PRN

## 2016-11-25 ENCOUNTER — Ambulatory Visit (INDEPENDENT_AMBULATORY_CARE_PROVIDER_SITE_OTHER): Payer: BLUE CROSS/BLUE SHIELD

## 2016-11-25 DIAGNOSIS — Z23 Encounter for immunization: Secondary | ICD-10-CM | POA: Diagnosis not present

## 2016-11-25 NOTE — Progress Notes (Signed)
Pt came in today to obtain her annual flu shot. Per pt due to her being on Enbrel she is to get the high dose flu shot. This was given to pt last year. Spoke with Dr. Volanda Napoleon and she gave the approval to give.

## 2016-12-10 DIAGNOSIS — M238X1 Other internal derangements of right knee: Secondary | ICD-10-CM | POA: Diagnosis not present

## 2016-12-10 DIAGNOSIS — M1711 Unilateral primary osteoarthritis, right knee: Secondary | ICD-10-CM | POA: Diagnosis not present

## 2016-12-10 DIAGNOSIS — M25561 Pain in right knee: Secondary | ICD-10-CM | POA: Diagnosis not present

## 2016-12-10 DIAGNOSIS — G8929 Other chronic pain: Secondary | ICD-10-CM | POA: Diagnosis not present

## 2016-12-13 DIAGNOSIS — M797 Fibromyalgia: Secondary | ICD-10-CM | POA: Diagnosis not present

## 2016-12-13 DIAGNOSIS — M0589 Other rheumatoid arthritis with rheumatoid factor of multiple sites: Secondary | ICD-10-CM | POA: Diagnosis not present

## 2016-12-13 DIAGNOSIS — Z6837 Body mass index (BMI) 37.0-37.9, adult: Secondary | ICD-10-CM | POA: Diagnosis not present

## 2016-12-13 DIAGNOSIS — M25512 Pain in left shoulder: Secondary | ICD-10-CM | POA: Diagnosis not present

## 2016-12-13 DIAGNOSIS — E669 Obesity, unspecified: Secondary | ICD-10-CM | POA: Diagnosis not present

## 2016-12-13 DIAGNOSIS — M150 Primary generalized (osteo)arthritis: Secondary | ICD-10-CM | POA: Diagnosis not present

## 2016-12-13 DIAGNOSIS — M79672 Pain in left foot: Secondary | ICD-10-CM | POA: Diagnosis not present

## 2016-12-13 DIAGNOSIS — M25561 Pain in right knee: Secondary | ICD-10-CM | POA: Diagnosis not present

## 2016-12-18 DIAGNOSIS — M238X1 Other internal derangements of right knee: Secondary | ICD-10-CM | POA: Diagnosis not present

## 2016-12-24 ENCOUNTER — Emergency Department (HOSPITAL_COMMUNITY)
Admission: EM | Admit: 2016-12-24 | Discharge: 2016-12-24 | Disposition: A | Discharge status: Home / Self Care | Payer: BLUE CROSS/BLUE SHIELD | Attending: Emergency Medicine | Admitting: Emergency Medicine

## 2016-12-24 ENCOUNTER — Encounter (HOSPITAL_COMMUNITY): Payer: Self-pay | Admitting: *Deleted

## 2016-12-24 ENCOUNTER — Emergency Department (HOSPITAL_COMMUNITY): Payer: BLUE CROSS/BLUE SHIELD

## 2016-12-24 DIAGNOSIS — Z79899 Other long term (current) drug therapy: Secondary | ICD-10-CM | POA: Insufficient documentation

## 2016-12-24 DIAGNOSIS — Z96659 Presence of unspecified artificial knee joint: Secondary | ICD-10-CM | POA: Diagnosis not present

## 2016-12-24 DIAGNOSIS — I10 Essential (primary) hypertension: Secondary | ICD-10-CM | POA: Insufficient documentation

## 2016-12-24 DIAGNOSIS — Z85828 Personal history of other malignant neoplasm of skin: Secondary | ICD-10-CM | POA: Diagnosis not present

## 2016-12-24 DIAGNOSIS — R1032 Left lower quadrant pain: Secondary | ICD-10-CM | POA: Diagnosis present

## 2016-12-24 DIAGNOSIS — R197 Diarrhea, unspecified: Secondary | ICD-10-CM | POA: Insufficient documentation

## 2016-12-24 DIAGNOSIS — R112 Nausea with vomiting, unspecified: Secondary | ICD-10-CM

## 2016-12-24 DIAGNOSIS — K2970 Gastritis, unspecified, without bleeding: Secondary | ICD-10-CM | POA: Diagnosis not present

## 2016-12-24 DIAGNOSIS — Z87891 Personal history of nicotine dependence: Secondary | ICD-10-CM | POA: Insufficient documentation

## 2016-12-24 LAB — CBC WITH DIFFERENTIAL/PLATELET
Basophils Absolute: 0 10*3/uL (ref 0.0–0.1)
Basophils Relative: 0 %
Eosinophils Absolute: 0 10*3/uL (ref 0.0–0.7)
Eosinophils Relative: 0 %
HCT: 41.3 % (ref 36.0–46.0)
Hemoglobin: 13.7 g/dL (ref 12.0–15.0)
Lymphocytes Relative: 7 %
Lymphs Abs: 0.8 10*3/uL (ref 0.7–4.0)
MCH: 31.1 pg (ref 26.0–34.0)
MCHC: 33.2 g/dL (ref 30.0–36.0)
MCV: 93.7 fL (ref 78.0–100.0)
Monocytes Absolute: 0.4 10*3/uL (ref 0.1–1.0)
Monocytes Relative: 4 %
Neutro Abs: 9.6 10*3/uL — ABNORMAL HIGH (ref 1.7–7.7)
Neutrophils Relative %: 89 %
Platelets: 387 10*3/uL (ref 150–400)
RBC: 4.41 MIL/uL (ref 3.87–5.11)
RDW: 14.2 % (ref 11.5–15.5)
WBC: 10.9 10*3/uL — ABNORMAL HIGH (ref 4.0–10.5)

## 2016-12-24 LAB — COMPREHENSIVE METABOLIC PANEL
ALT: 14 U/L (ref 14–54)
AST: 24 U/L (ref 15–41)
Albumin: 3.9 g/dL (ref 3.5–5.0)
Alkaline Phosphatase: 91 U/L (ref 38–126)
Anion gap: 10 (ref 5–15)
BUN: 16 mg/dL (ref 6–20)
CO2: 27 mmol/L (ref 22–32)
Calcium: 9 mg/dL (ref 8.9–10.3)
Chloride: 104 mmol/L (ref 101–111)
Creatinine, Ser: 0.51 mg/dL (ref 0.44–1.00)
GFR calc Af Amer: >60 mL/min (ref >60)
GFR calc non Af Amer: >60 mL/min (ref >60)
Glucose, Bld: 106 mg/dL — ABNORMAL HIGH (ref 65–99)
Potassium: 3.6 mmol/L (ref 3.5–5.1)
Sodium: 141 mmol/L (ref 135–145)
Total Bilirubin: 0.6 mg/dL (ref 0.3–1.2)
Total Protein: 6.8 g/dL (ref 6.5–8.1)

## 2016-12-24 LAB — URINALYSIS, ROUTINE W REFLEX MICROSCOPIC
Bilirubin Urine: NEGATIVE
Glucose, UA: NEGATIVE mg/dL
Hgb urine dipstick: NEGATIVE
Ketones, ur: 20 mg/dL — AB
Leukocytes, UA: NEGATIVE
Nitrite: NEGATIVE
Protein, ur: NEGATIVE mg/dL
Specific Gravity, Urine: 1.016 (ref 1.005–1.030)
pH: 6 (ref 5.0–8.0)

## 2016-12-24 LAB — LIPASE, BLOOD: Lipase: 18 U/L (ref 11–51)

## 2016-12-24 MED ORDER — ONDANSETRON HCL 4 MG/2ML IJ SOLN
4.0000 mg | Freq: Once | INTRAMUSCULAR | Status: AC
  Administered 2016-12-24: 4 mg via INTRAVENOUS
  Filled 2016-12-24: qty 2

## 2016-12-24 MED ORDER — SODIUM CHLORIDE 0.9 % IV BOLUS (SEPSIS)
1000.0000 mL | Freq: Once | INTRAVENOUS | Status: AC
  Administered 2016-12-24: 1000 mL via INTRAVENOUS

## 2016-12-24 MED ORDER — ONDANSETRON 4 MG PO TBDP: ORAL_TABLET | ORAL | 0 refills | Status: DC

## 2016-12-24 MED ORDER — DIPHENHYDRAMINE HCL 50 MG/ML IJ SOLN
12.5000 mg | Freq: Once | INTRAMUSCULAR | Status: AC
  Administered 2016-12-24: 12.5 mg via INTRAVENOUS
  Filled 2016-12-24: qty 1

## 2016-12-24 MED ORDER — MORPHINE SULFATE (PF) 4 MG/ML IV SOLN
4.0000 mg | Freq: Once | INTRAVENOUS | Status: AC
  Administered 2016-12-24: 4 mg via INTRAVENOUS
  Filled 2016-12-24: qty 1

## 2016-12-24 MED ORDER — IOPAMIDOL (ISOVUE-300) INJECTION 61%
INTRAVENOUS | Status: AC
  Administered 2016-12-24: 100 mL via INTRAVENOUS
  Filled 2016-12-24: qty 100

## 2016-12-24 MED ORDER — PROCHLORPERAZINE EDISYLATE 5 MG/ML IJ SOLN
5.0000 mg | Freq: Once | INTRAMUSCULAR | Status: AC
  Administered 2016-12-24: 5 mg via INTRAVENOUS
  Filled 2016-12-24: qty 2

## 2016-12-24 NOTE — ED Provider Notes (Signed)
[facility] DEPT Provider Note   CSN: [id number] Arrival date & time: 12/24/16  1342     History   Chief Complaint Chief Complaint  Patient presents with  . Emesis  . Abdominal Pain    HPI [name] is a 61 y.o. female.  61 yo F with a chief complaint of left lower quadrant abdominal pain.  This started acutely about an hour ago.  Sudden severe cause nausea and vomiting.  Had some diaphoresis with it as well.  Some subjective fevers.  Denies suspicious food intake denies recent no contact.  Denies green or dark vomit.  The pain is sharp is persistent nonradiating.  Does have some suprapubic tenderness as well.  Denies urinary complaints.   The history is provided by the patient.  Emesis   This is a new problem. The current episode started 2 days ago. Associated symptoms include abdominal pain. Pertinent negatives include no arthralgias, no chills, no fever, no headaches and no myalgias.  Abdominal Pain   This is a new problem. The current episode started 2 days ago. The problem occurs constantly. The problem has not changed since onset.The pain is located in the LLQ. The quality of the pain is sharp and shooting. The pain is at a severity of 10/10. The pain is severe. Associated symptoms include nausea and vomiting. Pertinent negatives include fever, dysuria, headaches, arthralgias and myalgias. Nothing aggravates the symptoms. Nothing relieves the symptoms.    Past Medical History:  Diagnosis Date  . ANGIOMA 10/03/2008   REMOVED FROM RIGHT LOWER LEG-BENIGN  . Anxiety   . Arthritis    RA  . Bronchitis    hx of   . Cancer (HCC)    BASAL CELL SKIN CANCER  . Complication of anesthesia   . Fibromyalgia   . GERD 08/27/2008  . History of blood transfusion    1995  . History of hiatal hernia    hx of has had surgically repaired  . History of measles   . History of mumps   . History of nonmelanoma skin cancer   . HYPERLIPIDEMIA 08/27/2008  .  HYPERTENSION 08/27/2008   pt is currently on no meds   . Pneumonia    hx of   . PONV (postoperative nausea and vomiting)   . SEBORRHEIC KERATOSIS 08/27/2008    Patient Active Problem List   Diagnosis Date Noted  . Status post left foot surgery 07/07/2015  . Neuroma 05/20/2015  . HAV (hallux abducto valgus) 05/20/2015  . Hammertoe 05/20/2015  . Prominent metatarsal head 05/20/2015  . Chronic foot pain 05/20/2015  . Anxiety state 03/03/2015  . Hip bursitis 10/22/2014  . Osteophyte of hip 10/21/2014  . Epigastric cramping 11/09/2013  . Nausea with vomiting 11/06/2013  . Rheumatoid arthritis ([place]) 11/01/2013  . S/P laparoscopic sleeve gastrectomy 10/30/2013  . Anemia 06/04/2013  . CAP (community acquired pneumonia) 02/17/2013  . Restless leg syndrome 09/18/2012  . Morbid obesity with BMI of 45.0-49.9, adult ([place]) 01/18/2012  . Right gluteus tear 01/17/2012  . Nipple discharge in female 07/08/2011  . Chronic insomnia 06/18/2011  . Discharge from nipple 01/06/2011  . TMJ PAIN 01/12/2010  . DEPRESSION 12/10/2009  . SINUSITIS, ACUTE 12/30/2008  . Lumbago 12/30/2008  . EDEMA 10/03/2008  . Hyperlipidemia 08/27/2008  . Essential hypertension 08/27/2008  . GERD 08/27/2008    Past Surgical History:  Procedure Laterality Date  . ABDOMINAL HYSTERECTOMY  1995  . APPENDECTOMY    . BREAST BIOPSY Left   .  BREAST DUCTAL SYSTEM EXCISION  08/16/2011   Procedure: EXCISION DUCTAL SYSTEM BREAST;  Surgeon: Joyice Faster. Cornett, MD;  Location: [facility];  Service: General;  Laterality: Left;  left breast duct excision  . [place]   times 2; 1980 and 1982  . [place]  . CHOLECYSTECTOMY  1997  . EXCISION/RELEASE BURSA HIP Right 10/21/2014   Procedure: OPEN RIGHT HIP BURSECTOMY, EXOSECTOMY;  Surgeon: Paralee Cancel, MD;  Location: [facility];  Service: Orthopedics;  Laterality: Right;  . foot surgery  2004, 2005, 2008   bilat secondary to neuropathy   . JOINT  REPLACEMENT  10,12   lt total knee X2  . KNEE ARTHROSCOPY  2007, 2008, 2009  . LAPAROSCOPIC GASTRIC SLEEVE RESECTION N/A 10/30/2013   Procedure: LAPAROSCOPIC GASTRIC SLEEVE RESECTION WITH HIATAL HERNIA REPAIR AND UPPER ENDOSCOPY;  Surgeon: Greer Pickerel, MD;  Location: [facility];  Service: General;  Laterality: N/A;  . NASAL SINUS SURGERY  2013  . OPEN SURGICAL REPAIR OF GLUTEAL TENDON  01/17/2012   Procedure: OPEN SURGICAL REPAIR OF GLUTEAL TENDON;  Surgeon: Mauri Pole, MD;  Location: [facility];  Service: Orthopedics;  Laterality: Right;    OB History    No data available       Home Medications    Prior to Admission medications   Medication Sig Start Date End Date Taking? Authorizing Provider  acetaminophen (TYLENOL) 500 MG tablet Take 1,000 mg by mouth every 6 (six) hours as needed for pain.    Yes [provider]  Biotin 1000 MCG tablet Take 3,000 mcg by mouth daily.   Yes [provider]  estradiol (ESTRACE) 1 MG tablet TAKE 1 TABLET BY MOUTH  DAILY 12/08/15  Yes Burchette, Alinda Sierras, MD  folic acid (FOLVITE) 1 MG tablet Take 2 mg by mouth daily.    Yes [provider]  linaclotide (LINZESS) 290 MCG CAPS capsule Take 290 mcg by mouth daily before breakfast.   Yes [provider]  oxyCODONE-acetaminophen (PERCOCET/ROXICET) 5-325 MG tablet TAKE 1 TABLET AS NEEDED EVERY 8 HOURS FOR PAIN 12/13/16  Yes [provider]  predniSONE (DELTASONE) 5 MG tablet Take 5 mg by mouth daily with breakfast.   Yes [provider]  sertraline (ZOLOFT) 50 MG tablet TAKE ONE AND ONE- HALF  TABLET ONCE DAILY 05/30/15  Yes Burchette, Alinda Sierras, MD  zolpidem (AMBIEN) 10 MG tablet Take 1 tablet (10 mg total) by mouth at bedtime as needed for sleep. Patient taking differently: Take 10 mg by mouth at bedtime.  11/23/16  Yes Burchette, Alinda Sierras, MD  EPIPEN 2-PAK 0.3 MG/0.3ML SOAJ injection Inject 0.3 mLs (0.3 mg total) into the muscle once. 06/02/15   Burchette, Alinda Sierras, MD   etanercept (ENBREL) 50 MG/ML injection Inject 0.98 mLs (50 mg total) into the skin once a week. Resume end of next week Patient taking differently: Inject 50 mg into the skin once a week. Friday 11/01/13   Greer Pickerel, MD  Ferrous Sulfate (IRON) 325 (65 Fe) MG TABS Take one tablet daily. Patient not taking: Reported on 12/24/2016 03/29/16   Eulas Post, MD  methotrexate (50 MG/ML) 1 gm SOLR May resume previous dose at  end of next week Patient taking differently: Inject 50 mg into the skin once a week. Friday 11/01/13   Greer Pickerel, MD  ondansetron (ZOFRAN ODT) 4 MG disintegrating tablet 4mg  ODT q4 hours prn nausea/vomit 12/24/16   Deno Etienne, DO  pramipexole (MIRAPEX)  0.5 MG tablet Take 1 tablet (0.5 mg total) by mouth at bedtime. Patient not taking: Reported on 12/24/2016 04/09/16   Eulas Post, MD  promethazine (PHENERGAN) 25 MG tablet Take 25 mg by mouth every 8 (eight) hours as needed for nausea or vomiting.    Trula Slade, DPM  scopolamine (TRANSDERM-SCOP) 1 MG/3DAYS Place 1 patch (1.5 mg total) onto the skin every 3 (three) days. Behind ear Patient not taking: Reported on 06/15/2016 12/25/14   Eulas Post, MD    Family History Family History  Problem Relation Age of Onset  . Cancer Mother        breast  . Breast cancer Mother 56  . Kidney disease Father   . Epilepsy Father   . Heart attack Sister   . Heart attack Sister   . Diabetes Son     Social History Social History  Substance Use Topics  . Smoking status: Former Smoker    Packs/day: 0.25    Years: 4.00    Types: Cigarettes    Quit date: 02/23/1984  . Smokeless tobacco: Never Used  . Alcohol use No     Allergies   Bee venom; Codeine sulfate; and Penicillins   Review of Systems Review of Systems  Constitutional: Negative for chills and fever.  HENT: Negative for congestion and rhinorrhea.   Eyes: Negative for redness and visual disturbance.  Respiratory: Negative for shortness of breath  and wheezing.   Cardiovascular: Negative for chest pain and palpitations.  Gastrointestinal: Positive for abdominal pain, nausea and vomiting.  Genitourinary: Negative for dysuria and urgency.  Musculoskeletal: Negative for arthralgias and myalgias.  Skin: Negative for pallor and wound.  Neurological: Negative for dizziness and headaches.     Physical Exam Updated Vital Signs BP (!) 129/59   Pulse 62   Temp (!) 97.5 F (36.4 C) (Oral)   Resp 16   SpO2 99%   Physical Exam  Constitutional: She is oriented to person, place, and time. She appears well-developed and well-nourished. No distress.  HENT:  Head: Normocephalic and atraumatic.  Eyes: Pupils are equal, round, and reactive to light. EOM are normal.  Neck: Normal range of motion. Neck supple.  Cardiovascular: Normal rate and regular rhythm.  Exam reveals no gallop and no friction rub.   No murmur heard. Pulmonary/Chest: Effort normal. She has no wheezes. She has no rales.  Abdominal: Soft. She exhibits no distension and no mass. There is tenderness ( Worse to the left lower quadrant). There is no guarding.  Musculoskeletal: She exhibits no edema or tenderness.  Neurological: She is alert and oriented to person, place, and time.  Skin: Skin is warm and dry. She is not diaphoretic.  Psychiatric: She has a normal mood and affect. Her behavior is normal.  Nursing note and vitals reviewed.    ED Treatments / Results  Labs (all labs ordered are listed, but only abnormal results are displayed) Labs Reviewed  CBC WITH DIFFERENTIAL/PLATELET - Abnormal; Notable for the following:       Result Value   WBC 10.9 (*)    Neutro Abs 9.6 (*)    All other components within normal limits  COMPREHENSIVE METABOLIC PANEL - Abnormal; Notable for the following:    Glucose, Bld 106 (*)    All other components within normal limits  URINALYSIS, ROUTINE W REFLEX MICROSCOPIC - Abnormal; Notable for the following:    Ketones, ur 20 (*)    All  other components within normal limits  URINE CULTURE  LIPASE, BLOOD    EKG  EKG Interpretation  Date/Time:  Friday December 24 2016 19:22:44 EDT Ventricular Rate:  60 PR Interval:    QRS Duration: 95 QT Interval:  450 QTC Calculation: 450 R Axis:   64 Text Interpretation:  Sinus rhythm No significant change since last tracing Confirmed by Deno Etienne [phone]) on 12/24/2016 7:48:03 PM       Radiology Ct Abdomen Pelvis W Contrast  Result Date: 12/24/2016 CLINICAL DATA:  Abdominal pain.  Diverticulitis suspected. EXAM: CT ABDOMEN AND PELVIS WITH CONTRAST TECHNIQUE: Multidetector CT imaging of the abdomen and pelvis was performed using the standard protocol following bolus administration of intravenous contrast. CONTRAST:  175mL ISOVUE-300 IOPAMIDOL (ISOVUE-300) INJECTION 61% COMPARISON:  CT 10/04/2014 FINDINGS: Lower chest: The lung bases are clear. Hepatobiliary: No focal hepatic lesion. Postcholecystectomy with clips in the gallbladder fossa. Common bile duct is normal in caliber, mild central intrahepatic biliary ductal dilatation, stable. Pancreas: Fatty atrophy.  No ductal dilatation or inflammation. Spleen: Normal in size without focal abnormality. Adrenals/Urinary Tract: Normal adrenal glands. No hydronephrosis or perinephric edema. Homogeneous renal enhancement with symmetric excretion on delayed phase imaging. There are 2 subcentimeter hypodense lesions in the left kidney, both measuring approximately 9 mm. Partially exophytic upper pole lesion image 19 series 2, was seen previously, some internal increased density in the interim but otherwise unchanged in size. Partially exophytic posterior mid lesion image 26 series 2 was not seen on prior exam. Urinary bladder is physiologically distended without wall thickening. Stomach/Bowel: Post gastric sleeve resection. Small bowel is nondistended. Post appendectomy. Small volume of stool throughout the colon. No colonic wall thickening. No  significant diverticular disease. Vascular/Lymphatic: No significant vascular findings are present. No enlarged abdominal or pelvic lymph nodes. Reproductive: Status post hysterectomy. No adnexal masses. Other: No free air, free fluid, or intra-abdominal fluid collection. Small fat containing umbilical hernia. Musculoskeletal: There are no acute or suspicious osseous abnormalities. IMPRESSION: 1. No acute abnormality or explanation for abdominal pain. 2. Subcentimeter left renal lesions, 1 of which is new from 2016. These are technically too small to accurately characterize, but presumed cysts without suspicious features. Electronically Signed   By: Jeb Levering M.D.   On: 12/24/2016 22:08    Procedures Procedures (including critical care time)  Medications Ordered in ED Medications  sodium chloride 0.9 % bolus 1,000 mL (0 mLs Intravenous Stopped 12/24/16 2241)  ondansetron (ZOFRAN) injection 4 mg (4 mg Intravenous Given 12/24/16 1839)  morphine 4 MG/ML injection 4 mg (4 mg Intravenous Given 12/24/16 1840)  iopamidol (ISOVUE-300) 61 % injection (100 mLs Intravenous Contrast Given 12/24/16 2122)  prochlorperazine (COMPAZINE) injection 5 mg (5 mg Intravenous Given 12/24/16 2238)  diphenhydrAMINE (BENADRYL) injection 12.5 mg (12.5 mg Intravenous Given 12/24/16 2237)     Initial Impression / Assessment and Plan / ED Course  I have reviewed the triage vital signs and the nursing notes.  Pertinent labs & imaging results that were available during my care of the patient were reviewed by me and considered in my medical decision making (see chart for details).     61 yo F with a chief complaint of left lower quadrant abdominal pain.  This started acutely about an hour ago.  She actually pointed to her left upper quadrant when she said she was having pain.  Focally tender to the left lower quadrant on my exam.  Will check labs CT give fluids pain meds and reassess.  CT without acute finding.  Patient  feeling quite a bit better but still nauseated.  Give more antiemetics, oral trial.   Tolerating fluids.  Pain improved. D/c home.   11:35 PM:  I have discussed the diagnosis/risks/treatment options with the patient and family and believe the pt to be eligible for discharge home to follow-up with PCP. We also discussed returning to the ED immediately if new or worsening sx occur. We discussed the sx which are most concerning (e.g., sudden worsening pain, fever, inability to tolerate by mouth) that necessitate immediate return. Medications administered to the patient during their visit and any new prescriptions provided to the patient are listed below.  Medications given during this visit Medications  sodium chloride 0.9 % bolus 1,000 mL (0 mLs Intravenous Stopped 12/24/16 2241)  ondansetron (ZOFRAN) injection 4 mg (4 mg Intravenous Given 12/24/16 1839)  morphine 4 MG/ML injection 4 mg (4 mg Intravenous Given 12/24/16 1840)  iopamidol (ISOVUE-300) 61 % injection (100 mLs Intravenous Contrast Given 12/24/16 2122)  prochlorperazine (COMPAZINE) injection 5 mg (5 mg Intravenous Given 12/24/16 2238)  diphenhydrAMINE (BENADRYL) injection 12.5 mg (12.5 mg Intravenous Given 12/24/16 2237)     The patient appears reasonably screen and/or stabilized for discharge and I doubt any other medical condition or other [facility] requiring further screening, evaluation, or treatment in the ED at this time prior to discharge.    Final Clinical Impressions(s) / ED Diagnoses   Final diagnoses:  Nausea vomiting and diarrhea    New Prescriptions New Prescriptions   ONDANSETRON (ZOFRAN ODT) 4 MG DISINTEGRATING TABLET    4mg  ODT q4 hours prn nausea/vomit     Deno Etienne, DO 12/24/16 2335

## 2016-12-24 NOTE — ED Triage Notes (Addendum)
Per EMS, pt complains of abd pain/n/v/d for the past 45 minutes. Pt was given PO zofran w/ no relief.   BP 140/60 HR 60 RR 18 SpO2 100% CBG 119

## 2016-12-24 NOTE — ED Notes (Signed)
ED Provider at bedside. 

## 2016-12-24 NOTE — ED Notes (Signed)
Patient transported to CT 

## 2016-12-24 NOTE — ED Notes (Signed)
Pt tolerated sprite well---- has had no nausea or vomiting.

## 2016-12-26 LAB — URINE CULTURE

## 2016-12-30 DIAGNOSIS — M25561 Pain in right knee: Secondary | ICD-10-CM | POA: Diagnosis not present

## 2016-12-30 DIAGNOSIS — G8929 Other chronic pain: Secondary | ICD-10-CM | POA: Diagnosis not present

## 2016-12-30 DIAGNOSIS — M1711 Unilateral primary osteoarthritis, right knee: Secondary | ICD-10-CM | POA: Diagnosis not present

## 2016-12-30 DIAGNOSIS — S83281A Other tear of lateral meniscus, current injury, right knee, initial encounter: Secondary | ICD-10-CM | POA: Diagnosis not present

## 2017-01-27 ENCOUNTER — Encounter: Payer: Self-pay | Admitting: Family Medicine

## 2017-01-27 ENCOUNTER — Other Ambulatory Visit: Payer: Self-pay | Admitting: Family Medicine

## 2017-01-28 MED ORDER — ESTRADIOL 1 MG PO TABS: 1.0000 mg | ORAL_TABLET | Freq: Every day | ORAL | 0 refills | Status: DC

## 2017-01-28 MED ORDER — SERTRALINE HCL 50 MG PO TABS: ORAL_TABLET | ORAL | 0 refills | Status: DC

## 2017-01-28 NOTE — Telephone Encounter (Signed)
Last refill 11/23/16 and last refill 05/05/16.  Okay to fill?

## 2017-01-31 NOTE — Telephone Encounter (Signed)
Refill with 2 additional refills. 

## 2017-02-02 MED ORDER — ZOLPIDEM TARTRATE 10 MG PO TABS: 10.0000 mg | ORAL_TABLET | Freq: Every evening | ORAL | 2 refills | Status: DC | PRN

## 2017-02-11 DIAGNOSIS — M23261 Derangement of other lateral meniscus due to old tear or injury, right knee: Secondary | ICD-10-CM | POA: Diagnosis not present

## 2017-02-11 DIAGNOSIS — M23221 Derangement of posterior horn of medial meniscus due to old tear or injury, right knee: Secondary | ICD-10-CM | POA: Diagnosis not present

## 2017-02-11 DIAGNOSIS — M2241 Chondromalacia patellae, right knee: Secondary | ICD-10-CM | POA: Diagnosis not present

## 2017-03-17 ENCOUNTER — Other Ambulatory Visit (HOSPITAL_COMMUNITY): Payer: Self-pay | Admitting: Obstetrics & Gynecology

## 2017-03-17 ENCOUNTER — Ambulatory Visit (HOSPITAL_COMMUNITY)
Admission: RE | Admit: 2017-03-17 | Discharge: 2017-03-17 | Disposition: A | Discharge status: Home / Self Care | Payer: BLUE CROSS/BLUE SHIELD | Source: Ambulatory Visit | Attending: Physician Assistant | Admitting: Physician Assistant

## 2017-03-17 DIAGNOSIS — M79604 Pain in right leg: Secondary | ICD-10-CM | POA: Diagnosis not present

## 2017-03-17 DIAGNOSIS — M79672 Pain in left foot: Secondary | ICD-10-CM | POA: Diagnosis not present

## 2017-03-17 DIAGNOSIS — M7989 Other specified soft tissue disorders: Principal | ICD-10-CM

## 2017-03-17 DIAGNOSIS — Z6836 Body mass index (BMI) 36.0-36.9, adult: Secondary | ICD-10-CM | POA: Diagnosis not present

## 2017-03-17 DIAGNOSIS — M25512 Pain in left shoulder: Secondary | ICD-10-CM | POA: Diagnosis not present

## 2017-03-17 DIAGNOSIS — E669 Obesity, unspecified: Secondary | ICD-10-CM | POA: Diagnosis not present

## 2017-03-17 DIAGNOSIS — M25561 Pain in right knee: Secondary | ICD-10-CM | POA: Diagnosis not present

## 2017-03-17 DIAGNOSIS — M150 Primary generalized (osteo)arthritis: Secondary | ICD-10-CM | POA: Diagnosis not present

## 2017-03-17 DIAGNOSIS — M0589 Other rheumatoid arthritis with rheumatoid factor of multiple sites: Secondary | ICD-10-CM | POA: Diagnosis not present

## 2017-03-17 DIAGNOSIS — M797 Fibromyalgia: Secondary | ICD-10-CM | POA: Diagnosis not present

## 2017-03-17 NOTE — Progress Notes (Signed)
Preliminary results by tech - Right lower ext. Venous Duplex Completed. Negative for deep and superficial vein thrombosis and Baker's Cyst. Results given to [place]. Oda Cogan, BS, RDMS, RVT

## 2017-03-30 DIAGNOSIS — Z4789 Encounter for other orthopedic aftercare: Secondary | ICD-10-CM | POA: Diagnosis not present

## 2017-03-30 DIAGNOSIS — M13861 Other specified arthritis, right knee: Secondary | ICD-10-CM | POA: Diagnosis not present

## 2017-03-30 DIAGNOSIS — M1711 Unilateral primary osteoarthritis, right knee: Secondary | ICD-10-CM | POA: Insufficient documentation

## 2017-04-22 DIAGNOSIS — H5213 Myopia, bilateral: Secondary | ICD-10-CM | POA: Diagnosis not present

## 2017-05-07 ENCOUNTER — Other Ambulatory Visit: Payer: Self-pay | Admitting: Family Medicine

## 2017-05-09 NOTE — Telephone Encounter (Signed)
Not seen in one year.  Refill  Once.  Needs office follow up.

## 2017-05-16 ENCOUNTER — Ambulatory Visit (INDEPENDENT_AMBULATORY_CARE_PROVIDER_SITE_OTHER): Payer: Medicare Other | Admitting: Family Medicine

## 2017-05-16 ENCOUNTER — Encounter: Payer: Self-pay | Admitting: Family Medicine

## 2017-05-16 VITALS — BP 120/80 | HR 70 | Temp 98.1°F | Ht 63.75 in | Wt 208.7 lb

## 2017-05-16 DIAGNOSIS — F5104 Psychophysiologic insomnia: Secondary | ICD-10-CM | POA: Diagnosis not present

## 2017-05-16 DIAGNOSIS — Z1159 Encounter for screening for other viral diseases: Secondary | ICD-10-CM

## 2017-05-16 DIAGNOSIS — E785 Hyperlipidemia, unspecified: Secondary | ICD-10-CM | POA: Diagnosis not present

## 2017-05-16 DIAGNOSIS — L570 Actinic keratosis: Secondary | ICD-10-CM | POA: Diagnosis not present

## 2017-05-16 DIAGNOSIS — M069 Rheumatoid arthritis, unspecified: Secondary | ICD-10-CM | POA: Diagnosis not present

## 2017-05-16 DIAGNOSIS — F411 Generalized anxiety disorder: Secondary | ICD-10-CM

## 2017-05-16 DIAGNOSIS — Z9884 Bariatric surgery status: Secondary | ICD-10-CM | POA: Diagnosis not present

## 2017-05-16 DIAGNOSIS — R202 Paresthesia of skin: Secondary | ICD-10-CM | POA: Diagnosis not present

## 2017-05-16 MED ORDER — ZOLPIDEM TARTRATE 10 MG PO TABS: 10.0000 mg | ORAL_TABLET | Freq: Every evening | ORAL | 1 refills | Status: DC | PRN

## 2017-05-16 NOTE — Progress Notes (Addendum)
Subjective:     Patient ID: [name], female   DOB: [birth year], 62 y.o.   MRN: [id number]  HPI Patient seen -initially scheduled for "physical exam". She has history of obesity, hypertension, rheumatoid arthritis, fibromyalgia. She had gastric sleeve surgery several years ago. He does not had any recent B12 levels. She does have intermittent occasional paresthesias of the hands. No weakness. No wrist pains. No significant cervical neck pains. No history of shingles vaccine. She's had previous hysterectomy. She gets yearly mammograms. Colonoscopy up-to-date. No history of hepatitis C screening.  She has chronic insomnia and has been on Ambien for several years. His tried tapering off without any success. She has rheumatoid arthritis follow rheumatology gets regular CBCs and comprehensive metabolic panel. She is on immunotherapy and stable.  Slightly scaly lesion left dorsal forearm which has been present for several months. This sometimes flakes off and then reoccurs. Slightly sore to touch occasionally. No prior history of skin cancer.  Past Medical History:  Diagnosis Date  . ANGIOMA 10/03/2008   REMOVED FROM RIGHT LOWER LEG-BENIGN  . Anxiety   . Arthritis    RA  . Bronchitis    hx of   . Cancer (HCC)    BASAL CELL SKIN CANCER  . Complication of anesthesia   . Fibromyalgia   . GERD 08/27/2008  . History of blood transfusion    1995  . History of hiatal hernia    hx of has had surgically repaired  . History of measles   . History of mumps   . History of nonmelanoma skin cancer   . HYPERLIPIDEMIA 08/27/2008  . HYPERTENSION 08/27/2008   pt is currently on no meds   . Pneumonia    hx of   . PONV (postoperative nausea and vomiting)   . SEBORRHEIC KERATOSIS 08/27/2008   Past Surgical History:  Procedure Laterality Date  . ABDOMINAL HYSTERECTOMY  1995  . APPENDECTOMY    . BREAST BIOPSY Left   . BREAST DUCTAL SYSTEM EXCISION  08/16/2011   Procedure: EXCISION DUCTAL SYSTEM BREAST;   Surgeon: Joyice Faster. Cornett, MD;  Location: [facility];  Service: General;  Laterality: Left;  left breast duct excision  . [place]   times 2; 1980 and 1982  . [place]  . CHOLECYSTECTOMY  1997  . EXCISION/RELEASE BURSA HIP Right 10/21/2014   Procedure: OPEN RIGHT HIP BURSECTOMY, EXOSECTOMY;  Surgeon: Paralee Cancel, MD;  Location: [facility];  Service: Orthopedics;  Laterality: Right;  . foot surgery  2004, 2005, 2008   bilat secondary to neuropathy   . JOINT REPLACEMENT  10,12   lt total knee X2  . KNEE ARTHROSCOPY  2007, 2008, 2009  . LAPAROSCOPIC GASTRIC SLEEVE RESECTION N/A 10/30/2013   Procedure: LAPAROSCOPIC GASTRIC SLEEVE RESECTION WITH HIATAL HERNIA REPAIR AND UPPER ENDOSCOPY;  Surgeon: Greer Pickerel, MD;  Location: [facility];  Service: General;  Laterality: N/A;  . NASAL SINUS SURGERY  2013  . OPEN SURGICAL REPAIR OF GLUTEAL TENDON  01/17/2012   Procedure: OPEN SURGICAL REPAIR OF GLUTEAL TENDON;  Surgeon: Mauri Pole, MD;  Location: [facility];  Service: Orthopedics;  Laterality: Right;    reports that she quit smoking about 33 years ago. Her smoking use included cigarettes. She has a 1.00 pack-year smoking history. She has never used smokeless tobacco. She reports that she does not drink alcohol or use drugs. family history includes Breast cancer (age of onset: 82) in her mother; Cancer  in her mother; Diabetes in her son; Epilepsy in her father; Heart attack in her sister and sister; Kidney disease in her father. Allergies  Allergen Reactions  . Bee Venom Anaphylaxis  . Codeine Sulfate Other (See Comments)    GI upset  . Penicillins Rash     Review of Systems  Constitutional: Negative for activity change, appetite change, fatigue, fever and unexpected weight change.  HENT: Negative for ear pain, hearing loss, sore throat and trouble swallowing.   Eyes: Negative for visual disturbance.  Respiratory: Negative for cough and shortness of breath.    Cardiovascular: Negative for chest pain and palpitations.  Gastrointestinal: Negative for abdominal pain, blood in stool, constipation and diarrhea.  Genitourinary: Negative for dysuria and hematuria.  Musculoskeletal: Negative for arthralgias, back pain and myalgias.  Skin: Negative for rash.  Neurological: Negative for dizziness, syncope and headaches.  Hematological: Negative for adenopathy.  Psychiatric/Behavioral: Negative for confusion and dysphoric mood.       Objective:   Physical Exam  Constitutional: She is oriented to person, place, and time. She appears well-developed and well-nourished.  HENT:  Head: Normocephalic and atraumatic.  Eyes: Pupils are equal, round, and reactive to light. EOM are normal.  Neck: Normal range of motion. Neck supple. No thyromegaly present.  Cardiovascular: Normal rate, regular rhythm and normal heart sounds.  No murmur heard. Pulmonary/Chest: Breath sounds normal. No respiratory distress. She has no wheezes. She has no rales.  Abdominal: Soft. Bowel sounds are normal. She exhibits no distension and no mass. There is no tenderness. There is no rebound and no guarding.  Musculoskeletal: Normal range of motion. She exhibits no edema.  Lymphadenopathy:    She has no cervical adenopathy.  Neurological: She is alert and oriented to person, place, and time. She displays normal reflexes. No cranial nerve deficit.  Skin:  Patient has slightly elevated scaly nonulcerative lesion dorsum left forearm. Approximate 2 mm diameter  Psychiatric: She has a normal mood and affect. Her behavior is normal. Judgment and thought content normal.       Assessment:     #1 history of gastric sleeve surgery  #2 intermittent paresthesias of the hands.  #3 probable early actinic keratosis left forearm  #4 chronic insomnia    Plan:     -Check labs with B12, lipid panel, hepatitis C antibody -Refill Ambien for 6 months -Discussed shingles vaccine. She'll check  on insurance coverage -Continue yearly mammogram -No indication for Pap smears as she has had previous hysterectomy -we discussed risks and benefits of liquid nitrogen therapy for probably actinic keratoses of left forearm- including risks of blistering, infection, and very low risk of scarring.  Pt consented.  Treated without difficulty and pt tolerated well.Eulas Post MD [facility] at [facility]

## 2017-05-16 NOTE — Patient Instructions (Signed)
Consider new shingles vaccine (Shingrix).  Check on insurance coverage first.

## 2017-05-17 LAB — LIPID PANEL
Cholesterol: 236 mg/dL — ABNORMAL HIGH (ref 0–200)
HDL: 80.9 mg/dL (ref >39.00)
LDL Cholesterol: 134 mg/dL — ABNORMAL HIGH (ref 0–99)
NonHDL: 155.08
Total CHOL/HDL Ratio: 3
Triglycerides: 106 mg/dL (ref 0.0–149.0)
VLDL: 21.2 mg/dL (ref 0.0–40.0)

## 2017-05-17 LAB — HEPATITIS C ANTIBODY
Hepatitis C Ab: NONREACTIVE
SIGNAL TO CUT-OFF: 0.01 (ref <1.00)

## 2017-05-17 LAB — VITAMIN B12: Vitamin B-12: 277 pg/mL (ref 211–911)

## 2017-06-03 ENCOUNTER — Encounter (HOSPITAL_COMMUNITY): Payer: Self-pay

## 2017-06-05 DIAGNOSIS — J40 Bronchitis, not specified as acute or chronic: Secondary | ICD-10-CM | POA: Diagnosis not present

## 2017-07-11 ENCOUNTER — Ambulatory Visit: Payer: Medicare Other | Admitting: Family Medicine

## 2017-07-15 ENCOUNTER — Ambulatory Visit: Payer: Medicare Other | Admitting: Family Medicine

## 2017-07-25 DIAGNOSIS — M150 Primary generalized (osteo)arthritis: Secondary | ICD-10-CM | POA: Diagnosis not present

## 2017-07-25 DIAGNOSIS — M25512 Pain in left shoulder: Secondary | ICD-10-CM | POA: Diagnosis not present

## 2017-07-25 DIAGNOSIS — M79672 Pain in left foot: Secondary | ICD-10-CM | POA: Diagnosis not present

## 2017-07-25 DIAGNOSIS — M797 Fibromyalgia: Secondary | ICD-10-CM | POA: Diagnosis not present

## 2017-07-25 DIAGNOSIS — M0589 Other rheumatoid arthritis with rheumatoid factor of multiple sites: Secondary | ICD-10-CM | POA: Diagnosis not present

## 2017-07-29 ENCOUNTER — Other Ambulatory Visit: Payer: Self-pay | Admitting: Family Medicine

## 2017-07-29 MED ORDER — ESTRADIOL 1 MG PO TABS: 1.0000 mg | ORAL_TABLET | Freq: Every day | ORAL | 0 refills | Status: DC

## 2017-07-29 MED ORDER — SERTRALINE HCL 50 MG PO TABS: ORAL_TABLET | ORAL | 0 refills | Status: DC

## 2017-08-05 ENCOUNTER — Ambulatory Visit (INDEPENDENT_AMBULATORY_CARE_PROVIDER_SITE_OTHER): Payer: Medicare Other | Admitting: Family Medicine

## 2017-08-05 ENCOUNTER — Encounter: Payer: Self-pay | Admitting: Family Medicine

## 2017-08-05 VITALS — BP 144/80 | Temp 98.2°F | Wt 213.0 lb

## 2017-08-05 DIAGNOSIS — J019 Acute sinusitis, unspecified: Secondary | ICD-10-CM

## 2017-08-05 MED ORDER — LEVOFLOXACIN 500 MG PO TABS: 500.0000 mg | ORAL_TABLET | Freq: Every day | ORAL | 0 refills | Status: AC

## 2017-08-05 MED ORDER — METHYLPREDNISOLONE ACETATE 40 MG/ML IJ SUSP
40.0000 mg | Freq: Once | INTRAMUSCULAR | Status: AC
  Administered 2017-08-05: 40 mg via INTRAMUSCULAR

## 2017-08-05 MED ORDER — METHYLPREDNISOLONE ACETATE 80 MG/ML IJ SUSP
80.0000 mg | Freq: Once | INTRAMUSCULAR | Status: AC
  Administered 2017-08-05: 80 mg via INTRAMUSCULAR

## 2017-08-05 NOTE — Progress Notes (Signed)
   Subjective:    Patient ID: ANYI FELS, female    DOB: [birth year], 62 y.o.   MRN: [id number]  HPI Here for 3 days of sinus pressure, PND, headache and blowing green mucus from the nose. No cough. Low grade fevers. Using Zyrtec and Mucinex.    Review of Systems  Constitutional: Positive for fever.  HENT: Positive for congestion, postnasal drip, sinus pressure and sinus pain. Negative for ear pain and sore throat.   Eyes: Negative.   Respiratory: Negative.        Objective:   Physical Exam  Constitutional: She appears well-developed and well-nourished.  HENT:  Right Ear: External ear normal.  Left Ear: External ear normal.  Nose: Nose normal.  Mouth/Throat: Oropharynx is clear and moist.  Eyes: Conjunctivae are normal.  Neck: No thyromegaly present.  Pulmonary/Chest: Effort normal and breath sounds normal. No stridor. No respiratory distress. She has no wheezes. She has no rales.  Lymphadenopathy:    She has no cervical adenopathy.          Assessment & Plan:  Sinusitis, treat with Levaquin and a shot of steroids.  Alysia Penna, MD

## 2017-08-19 ENCOUNTER — Ambulatory Visit (INDEPENDENT_AMBULATORY_CARE_PROVIDER_SITE_OTHER): Payer: Medicare Other | Admitting: Family Medicine

## 2017-08-19 ENCOUNTER — Encounter: Payer: Self-pay | Admitting: Family Medicine

## 2017-08-19 VITALS — BP 110/70 | HR 73 | Temp 98.1°F | Wt 208.8 lb

## 2017-08-19 DIAGNOSIS — R51 Headache: Secondary | ICD-10-CM | POA: Diagnosis not present

## 2017-08-19 DIAGNOSIS — R519 Headache, unspecified: Secondary | ICD-10-CM

## 2017-08-19 MED ORDER — NORTRIPTYLINE HCL 10 MG PO CAPS: 10.0000 mg | ORAL_CAPSULE | Freq: Every day | ORAL | 1 refills | Status: DC

## 2017-08-19 NOTE — Patient Instructions (Signed)
Try to gradually taper back on the Tylenol  Start the Nortriptyline one at night  May consider reducing the Ambien to one half at night.

## 2017-08-19 NOTE — Progress Notes (Signed)
Subjective:     Patient ID: [name], female   DOB: 09/23/1955, 62 y.o.   MRN: [id number]  HPI Patient is seen with several week history of daily  constant biparietal headache which radiates to occipital area. This a dull headache. Tylenol helps temporarily but she's been taking Tylenol up to every 3-4 hours. She was seen couple weeks ago and treated with Levaquin and intramuscular steroids which did not seem to help her headache much. She has had previous sphenoid sinusitis and headaches years ago had improved with treatment with antibiotics. This headache is somewhat different.  Denies any recent fevers or chills. No confusion.  Minimal nasal congestion.  Past Medical History:  Diagnosis Date  . ANGIOMA 10/03/2008   REMOVED FROM RIGHT LOWER LEG-BENIGN  . Anxiety   . Arthritis    RA  . Bronchitis    hx of   . Cancer (HCC)    BASAL CELL SKIN CANCER  . Complication of anesthesia   . Fibromyalgia   . GERD 08/27/2008  . History of blood transfusion    1995  . History of hiatal hernia    hx of has had surgically repaired  . History of measles   . History of mumps   . History of nonmelanoma skin cancer   . HYPERLIPIDEMIA 08/27/2008  . HYPERTENSION 08/27/2008   pt is currently on no meds   . Pneumonia    hx of   . PONV (postoperative nausea and vomiting)   . SEBORRHEIC KERATOSIS 08/27/2008   Past Surgical History:  Procedure Laterality Date  . ABDOMINAL HYSTERECTOMY  1995  . APPENDECTOMY    . BREAST BIOPSY Left   . BREAST DUCTAL SYSTEM EXCISION  08/16/2011   Procedure: EXCISION DUCTAL SYSTEM BREAST;  Surgeon: Joyice Faster. Cornett, MD;  Location: [place];  Service: General;  Laterality: Left;  left breast duct excision  . [place]   times 2; 1980 and 1982  . [place]  . CHOLECYSTECTOMY  1997  . EXCISION/RELEASE BURSA HIP Right 10/21/2014   Procedure: OPEN RIGHT HIP BURSECTOMY, EXOSECTOMY;  Surgeon: Paralee Cancel, MD;  Location: [facility];   Service: Orthopedics;  Laterality: Right;  . foot surgery  2004, 2005, 2008   bilat secondary to neuropathy   . JOINT REPLACEMENT  10,12   lt total knee X2  . KNEE ARTHROSCOPY  2007, 2008, 2009  . LAPAROSCOPIC GASTRIC SLEEVE RESECTION N/A 10/30/2013   Procedure: LAPAROSCOPIC GASTRIC SLEEVE RESECTION WITH HIATAL HERNIA REPAIR AND UPPER ENDOSCOPY;  Surgeon: Greer Pickerel, MD;  [facility];  Service: General;  Laterality: N/A;  . NASAL SINUS SURGERY  2013  . OPEN SURGICAL REPAIR OF GLUTEAL TENDON  01/17/2012   Procedure: OPEN SURGICAL REPAIR OF GLUTEAL TENDON;  Surgeon: Mauri Pole, MD;  [facility];  Service: Orthopedics;  Laterality: Right;    reports that she quit smoking about 33 years ago. Her smoking use included cigarettes. She has a 1.00 pack-year smoking history. She has never used smokeless tobacco. She reports that she does not drink alcohol or use drugs. family history includes Breast cancer (age of onset: 72) in her mother; Cancer in her mother; Diabetes in her son; Epilepsy in her father; Heart attack in her sister and sister; Kidney disease in her father. Allergies  Allergen Reactions  . Bee Venom Anaphylaxis  . Codeine Sulfate Other (See Comments)    GI upset  . Penicillins Rash  Review of Systems  Constitutional: Negative for chills and fever.  Neurological: Positive for headaches.       Objective:   Physical Exam  Constitutional: She appears well-developed and well-nourished.  Neck: Normal range of motion. Neck supple.  Cardiovascular: Normal rate.  Pulmonary/Chest: Effort normal and breath sounds normal.  Lymphadenopathy:    She has no cervical adenopathy.       Assessment:     Daily persistent headache. Question chronic tension-type headache. Overuse of over-the-counter analgesics    Plan:     -We've recommend she try to gradually taper back and eventually off Tylenol -Recommend trial of nortriptyline 10 mg daily at bedtime and reassess in  2 weeks -consider also other conservative measures such as heat, muscle massage, etc.  Eulas Post MD [facility] at [facility]

## 2017-08-29 ENCOUNTER — Ambulatory Visit: Payer: Medicare Other | Admitting: Family Medicine

## 2017-09-06 ENCOUNTER — Encounter: Payer: Self-pay | Admitting: Family Medicine

## 2017-09-06 ENCOUNTER — Ambulatory Visit (INDEPENDENT_AMBULATORY_CARE_PROVIDER_SITE_OTHER): Payer: Medicare Other | Admitting: Family Medicine

## 2017-09-06 VITALS — BP 120/70 | HR 66 | Temp 97.8°F | Wt 211.2 lb

## 2017-09-06 DIAGNOSIS — G4452 New daily persistent headache (NDPH): Secondary | ICD-10-CM

## 2017-09-06 MED ORDER — CYCLOBENZAPRINE HCL 5 MG PO TABS: 5.0000 mg | ORAL_TABLET | Freq: Every day | ORAL | 1 refills | Status: DC

## 2017-09-06 NOTE — Progress Notes (Signed)
Subjective:     Patient ID: [name], female   DOB: 17-Oct-1955, 62 y.o.   MRN: [id number]  HPI  patient seen for follow-up  Regarding daily persistent headache.    refer to note from 08/19/17 for detals "Patient is seen with several week history of daily  constant biparietal headache which radiates to occipital area. This a dull headache. Tylenol helps temporarily but she's been taking Tylenol up to every 3-4 hours. She was seen couple weeks ago and treated with Levaquin and intramuscular steroids which did not seem to help her headache much. She has had previous sphenoid sinusitis and headaches years ago had improved with treatment with antibiotics. This headache is somewhat different."  We suspected chronic tension-type headache. She was started on nortriptyline and currently up to 20 mg daily at bedtime and has not seen any improvement. No change in headache quality. This remains mostly mid parietal somewhat bilateral. Occasional sharp quality. Headache is somewhat intermittent. She is not taking daily analgesics. No fevers or chills. No focal neurologic symptoms. No visual changes. No nausea or vomiting. No exertional headache.  Past Medical History:  Diagnosis Date  . ANGIOMA 10/03/2008   REMOVED FROM RIGHT LOWER LEG-BENIGN  . Anxiety   . Arthritis    RA  . Bronchitis    hx of   . Cancer (HCC)    BASAL CELL SKIN CANCER  . Complication of anesthesia   . Fibromyalgia   . GERD 08/27/2008  . History of blood transfusion    1995  . History of hiatal hernia    hx of has had surgically repaired  . History of measles   . History of mumps   . History of nonmelanoma skin cancer   . HYPERLIPIDEMIA 08/27/2008  . HYPERTENSION 08/27/2008   pt is currently on no meds   . Pneumonia    hx of   . PONV (postoperative nausea and vomiting)   . SEBORRHEIC KERATOSIS 08/27/2008   Past Surgical History:  Procedure Laterality Date  . ABDOMINAL HYSTERECTOMY  1995  . APPENDECTOMY    . BREAST  BIOPSY Left   . BREAST DUCTAL SYSTEM EXCISION  08/16/2011   Procedure: EXCISION DUCTAL SYSTEM BREAST;  Surgeon: Joyice Faster. Cornett, MD;  Location: [place];  Service: General;  Laterality: Left;  left breast duct excision  . [place]   times 2; 1980 and 1982  . [place]  . CHOLECYSTECTOMY  1997  . EXCISION/RELEASE BURSA HIP Right 10/21/2014   Procedure: OPEN RIGHT HIP BURSECTOMY, EXOSECTOMY;  Surgeon: Paralee Cancel, MD;  [facility];  Service: Orthopedics;  Laterality: Right;  . foot surgery  2004, 2005, 2008   bilat secondary to neuropathy   . JOINT REPLACEMENT  10,12   lt total knee X2  . KNEE ARTHROSCOPY  2007, 2008, 2009  . LAPAROSCOPIC GASTRIC SLEEVE RESECTION N/A 10/30/2013   Procedure: LAPAROSCOPIC GASTRIC SLEEVE RESECTION WITH HIATAL HERNIA REPAIR AND UPPER ENDOSCOPY;  Surgeon: Greer Pickerel, MD;  Location[facility];  Service: General;  Laterality: N/A;  . NASAL SINUS SURGERY  2013  . OPEN SURGICAL REPAIR OF GLUTEAL TENDON  01/17/2012   Procedure: OPEN SURGICAL REPAIR OF GLUTEAL TENDON;  Surgeon: Mauri Pole, MD;  Location[facility];  Service: Orthopedics;  Laterality: Right;    reports that she quit smoking about 33 years ago. Her smoking use included cigarettes. She has a 1.00 pack-year smoking history. She has never used smokeless tobacco.  She reports that she does not drink alcohol or use drugs. family history includes Breast cancer (age of onset: 86) in her mother; Cancer in her mother; Diabetes in her son; Epilepsy in her father; Heart attack in her sister and sister; Kidney disease in her father. Allergies  Allergen Reactions  . Bee Venom Anaphylaxis  . Codeine Sulfate Other (See Comments)    GI upset  . Penicillins Rash        Review of Systems  Constitutional: Negative for chills and fever.  Eyes: Negative for visual disturbance.  Respiratory: Negative for shortness of breath.   Neurological: Positive for headaches.  Negative for seizures.  Hematological: Negative for adenopathy.       Objective:   Physical Exam  Constitutional: She is oriented to person, place, and time. She appears well-developed and well-nourished.  Eyes: Pupils are equal, round, and reactive to light.  Cardiovascular: Normal rate and regular rhythm.  Pulmonary/Chest: Effort normal and breath sounds normal.  Neurological: She is alert and oriented to person, place, and time. No cranial nerve deficit.  Skin: No rash noted.       Assessment:     Patient presents with daily persistent headache mid parietal area. Not improved with nortriptyline    Plan:     -Set up neurology appointment for further evaluation -We discussed possible neuro imaging but will defer to neurology at this point. -Consider trial of Flexeril 5 mg daily at bedtime  Eulas Post MD [facility] at [facility]

## 2017-09-06 NOTE — Patient Instructions (Signed)
Decrease the Pamelor to one at night for 3 nights and then stop  Start the Flexeril at night  We will set up neurology appointment.

## 2017-09-07 ENCOUNTER — Encounter: Payer: Self-pay | Admitting: Family Medicine

## 2017-09-26 ENCOUNTER — Telehealth: Payer: Self-pay | Admitting: Neurology

## 2017-09-26 ENCOUNTER — Encounter: Payer: Self-pay | Admitting: Neurology

## 2017-09-26 ENCOUNTER — Ambulatory Visit: Payer: Medicare Other | Admitting: Neurology

## 2017-09-26 VITALS — BP 158/68 | HR 64 | Ht 64.0 in | Wt 212.0 lb

## 2017-09-26 DIAGNOSIS — R519 Headache, unspecified: Secondary | ICD-10-CM

## 2017-09-26 DIAGNOSIS — M5481 Occipital neuralgia: Secondary | ICD-10-CM | POA: Diagnosis not present

## 2017-09-26 DIAGNOSIS — R51 Headache with orthostatic component, not elsewhere classified: Secondary | ICD-10-CM

## 2017-09-26 DIAGNOSIS — E538 Deficiency of other specified B group vitamins: Secondary | ICD-10-CM | POA: Diagnosis not present

## 2017-09-26 DIAGNOSIS — G8929 Other chronic pain: Secondary | ICD-10-CM

## 2017-09-26 MED ORDER — GABAPENTIN 300 MG PO CAPS: ORAL_CAPSULE | ORAL | 11 refills | Status: DC

## 2017-09-26 MED ORDER — METHYLPREDNISOLONE 4 MG PO TBPK: ORAL_TABLET | ORAL | 1 refills | Status: DC

## 2017-09-26 MED ORDER — METHYLPREDNISOLONE ACETATE 80 MG/ML IJ SUSP
160.0000 mg | Freq: Once | INTRAMUSCULAR | Status: AC
  Administered 2017-09-26: 160 mg via INTRAMUSCULAR

## 2017-09-26 NOTE — Progress Notes (Signed)
[facility]    Provider:  Dr Jaynee Eagles Referring Provider: Eulas Post, MD Primary Care Physician:  Eulas Post, MD  CC:  Occipital head   HPI:  [name] is a 62 y.o. female here as a referral from Dr. Elease Hashimoto for tension type headache.  She has a past medical history of anxiety, arthritis, fibromyalgia, hyperlipidemia, hypertension.  She quit smoking 33 years ago. Ongoing for 3 months. No Hx of frequent headaches or migraines. Here with husband who also provides information. No inciting events. Headaches wake her up in the middle of the night. They are pressure on the top of the head. Started 3 months ago and slowly progressed to daily. Shooting pains up from the neck, sharp on the side of the head. No neck pain.  Sharp shooting pains. No neck pain. Happening every day and can be "off the chart", lasting all day long, flexeril not helping, nortrptyline didn't help. Tylenol helps. No vision chnges, no fevers or stiff neck. No snoring, no apneic events, no dry mouth in the morning. Not in the temples or forehead.   Reviewed notes, labs and imaging from outside physicians, which showed:  B12 277, was instructed to take B12 supplements  Reviewed referring physician notes.  Patient was seen for several weeks history of daily constant biparietal headache radiating to the occipital headaches, dull, taking Tylenol every 3-4 hours.  She was also treated with Levaquin and intramuscular steroids which did not seem to help her headache much.  Chronic tension type headache was suspected she was started on nortriptyline and has not seen any improvement on 20 mg a day.  Remains mostly mid parietal somewhat bilateral.  Occasional sharp quality.  Headaches is somewhat intermittent no fevers or chills no focal neurologic symptoms no visual changes no nausea vomiting no exertional headache.  She was trialed on Flexeril 5 mg daily at bedtime.  Pamelor was stopped.   Review of  Systems: Patient complains of symptoms per HPI as well as the following symptoms: insomnia, dizziness, headache. Pertinent negatives and positives per HPI. All others negative.   Social History   Socioeconomic History  . Marital status: Married    Spouse name: Not on file  . Number of children: 2  . Years of education: Not on file  . Highest education level: High school graduate  Occupational History  . Occupation: retired    Comment: Radiographer, therapeutic  . Financial resource strain: Not on file  . Food insecurity:    Worry: Not on file    Inability: Not on file  . Transportation needs:    Medical: Not on file    Non-medical: Not on file  Tobacco Use  . Smoking status: Former Smoker    Packs/day: 0.25    Years: 4.00    Pack years: 1.00    Types: Cigarettes    Last attempt to quit: 02/23/1984    Years since quitting: 33.6  . Smokeless tobacco: Never Used  Substance and Sexual Activity  . Alcohol use: No  . Drug use: No  . Sexual activity: Not on file  Lifestyle  . Physical activity:    Days per week: Not on file    Minutes per session: Not on file  . Stress: Not on file  Relationships  . Social connections:    Talks on phone: Not on file    Gets together: Not on file    Attends religious service: Not on file  Active member of club or organization: Not on file    Attends meetings of clubs or organizations: Not on file    Relationship status: Not on file  . Intimate partner violence:    Fear of current or ex partner: Not on file    Emotionally abused: Not on file    Physically abused: Not on file    Forced sexual activity: Not on file  Other Topics Concern  . Not on file  Social History Narrative   Lives at home with husband   Disabled   Right handed   Caffeine: 1 cup of coffee daily    Family History  Problem Relation Age of Onset  . Cancer Mother        breast  . Breast cancer Mother 70  . Kidney disease Father   . Epilepsy Father   .  Heart attack Sister   . Heart attack Sister   . Diabetes Son     Past Medical History:  Diagnosis Date  . ANGIOMA 10/03/2008   REMOVED FROM RIGHT LOWER LEG-BENIGN  . Anxiety   . Arthritis    RA  . Bronchitis    hx of   . Cancer (HCC)    BASAL CELL SKIN CANCER  . Complication of anesthesia   . Fibromyalgia   . GERD 08/27/2008  . History of blood transfusion    1995  . History of hiatal hernia    hx of has had surgically repaired  . History of measles   . History of mumps   . History of nonmelanoma skin cancer   . HYPERLIPIDEMIA 08/27/2008  . HYPERTENSION 08/27/2008   pt is currently on no meds   . Pneumonia    hx of   . PONV (postoperative nausea and vomiting)   . Rheumatoid arthritis ([place])   . SEBORRHEIC KERATOSIS 08/27/2008    Past Surgical History:  Procedure Laterality Date  . ABDOMINAL HYSTERECTOMY  1995  . APPENDECTOMY    . BREAST BIOPSY Left   . BREAST DUCTAL SYSTEM EXCISION  08/16/2011   Procedure: EXCISION DUCTAL SYSTEM BREAST;  Surgeon: Joyice Faster. Cornett, MD;  Location: [facility];  Service: General;  Laterality: Left;  left breast duct excision  . [place]   times 2; 1980 and 1982  . [place]  . CHOLECYSTECTOMY  1997  . EXCISION/RELEASE BURSA HIP Right 10/21/2014   Procedure: OPEN RIGHT HIP BURSECTOMY, EXOSECTOMY;  Surgeon: Paralee Cancel, MD;  Location: [facility];  Service: Orthopedics;  Laterality: Right;  . foot surgery  2004, 2005, 2008   bilat secondary to neuropathy   . JOINT REPLACEMENT  10,12   lt total knee X2  . KNEE ARTHROSCOPY  2007, 2008, 2009  . LAPAROSCOPIC GASTRIC SLEEVE RESECTION N/A 10/30/2013   Procedure: LAPAROSCOPIC GASTRIC SLEEVE RESECTION WITH HIATAL HERNIA REPAIR AND UPPER ENDOSCOPY;  Surgeon: Greer Pickerel, MD;  Location: [facility];  Service: General;  Laterality: N/A;  . NASAL SINUS SURGERY  2013  . OPEN SURGICAL REPAIR OF GLUTEAL TENDON  01/17/2012   Procedure: OPEN SURGICAL REPAIR OF GLUTEAL TENDON;   Surgeon: Mauri Pole, MD;  Location: [facility];  Service: Orthopedics;  Laterality: Right;    Current Outpatient Medications  Medication Sig Dispense Refill  . acetaminophen (TYLENOL) 500 MG tablet Take 1,000 mg by mouth every 6 (six) hours as needed for pain.     . Biotin 1000 MCG tablet Take 3,000 mcg by mouth daily.    Marland Kitchen  cyclobenzaprine (FLEXERIL) 5 MG tablet Take 1 tablet (5 mg total) by mouth at bedtime. 30 tablet 1  . estradiol (ESTRACE) 1 MG tablet Take 1 tablet (1 mg total) by mouth daily. 90 tablet 0  . etanercept (ENBREL) 50 MG/ML injection Inject 0.98 mLs (50 mg total) into the skin once a week. Resume end of next week (Patient taking differently: Inject 50 mg into the skin once a week. Friday) 3.29 mL 0  . folic acid (FOLVITE) 1 MG tablet Take 2 mg by mouth daily.     . methotrexate (50 MG/ML) 1 gm SOLR May resume previous dose at  end of next week (Patient taking differently: Inject 50 mg into the skin once a week. Friday)    . ondansetron (ZOFRAN ODT) 4 MG disintegrating tablet 4mg  ODT q4 hours prn nausea/vomit 20 tablet 0  . oxyCODONE-acetaminophen (PERCOCET/ROXICET) 5-325 MG tablet TAKE 1 TABLET AS NEEDED EVERY 8 HOURS FOR PAIN  0  . predniSONE (DELTASONE) 5 MG tablet Take 5 mg by mouth daily with breakfast.    . sertraline (ZOLOFT) 50 MG tablet TAKE ONE AND ONE- HALF  TABLET ONCE DAILY 135 tablet 0  . zolpidem (AMBIEN) 10 MG tablet Take 1 tablet (10 mg total) by mouth at bedtime as needed. for sleep 90 tablet 1  . EPIPEN 2-PAK 0.3 MG/0.3ML SOAJ injection Inject 0.3 mLs (0.3 mg total) into the muscle once. 2 Device 3  . gabapentin (NEURONTIN) 300 MG capsule Take 1-2 before bedtime 60 capsule 11  . methylPREDNISolone (MEDROL DOSEPAK) 4 MG TBPK tablet follow package directions 21 tablet 1  . scopolamine (TRANSDERM-SCOP) 1 MG/3DAYS Place 1 patch (1.5 mg total) onto the skin every 3 (three) days. Behind ear 10 patch 0   No current facility-administered medications for this visit.       Allergies as of 09/26/2017 - Review Complete 09/26/2017  Allergen Reaction Noted  . Bee venom Anaphylaxis 10/09/2014  . Codeine sulfate Other (See Comments) 08/27/2008  . Penicillins Rash 08/27/2008    Vitals: BP (!) 158/68 (BP Location: Left Arm, Patient Position: Sitting)   Pulse 64   Ht 5\' 4"  (1.626 m)   Wt 212 lb (96.2 kg)   BMI 36.39 kg/m  Last Weight:  Wt Readings from Last 1 Encounters:  09/26/17 212 lb (96.2 kg)   Last Height:   Ht Readings from Last 1 Encounters:  09/26/17 5\' 4"  (1.626 m)    Physical exam: Exam: Gen: NAD, conversant, well nourised, obese, well groomed                     CV: RRR, no MRG. No Carotid Bruits. No peripheral edema, warm, nontender Eyes: Conjunctivae clear without exudates or hemorrhage  Neuro: Detailed Neurologic Exam  Speech:    Speech is normal; fluent and spontaneous with normal comprehension.  Cognition:    The patient is oriented to person, place, and time;     recent and remote memory intact;     language fluent;     normal attention, concentration,     fund of knowledge Cranial Nerves:    The pupils are equal, round, and reactive to light. The fundi are normal and spontaneous venous pulsations are present. Visual fields are full to finger confrontation. Extraocular movements are intact. Trigeminal sensation is intact and the muscles of mastication are normal. The face is symmetric. The palate elevates in the midline. Hearing intact. Voice is normal. Shoulder shrug is normal. The tongue has normal motion  without fasciculations.   Coordination:    Normal finger to nose and heel to shin. Normal rapid alternating movements.   Gait:    Heel-toe and tandem gait are normal.   Motor Observation:    No asymmetry, no atrophy, and no involuntary movements noted. Tone:    Normal muscle tone.    Posture:    Posture is normal. normal erect    Strength:    Strength is V/V in the upper and lower limbs.      Sensation:  intact to LT     Reflex Exam:  DTR's:    Deep tendon reflexes in the upper and lower extremities are normal bilaterally.   Toes:    The toes are downgoing bilaterally.   Clonus:    Clonus is absent.      Assessment/Plan:  62 year old with chronic intractable daily headaches appears to be occipital neuralgia but needs complete evaluation  MRI of the brain due to new onset headache after the age of 83, positional and occipital to evaluate for masses, tumors, chiari or other etiology  Medrol dosepak  Occipital nerve blocks  Physical therapy for occipital neuralgia  Gabapentin before bed  Meds ordered this encounter  Medications  . methylPREDNISolone (MEDROL DOSEPAK) 4 MG TBPK tablet    Sig: follow package directions    Dispense:  21 tablet    Refill:  1  . methylPREDNISolone acetate (DEPO-MEDROL) injection 160 mg  . gabapentin (NEURONTIN) 300 MG capsule    Sig: Take 1-2 before bedtime    Dispense:  60 capsule    Refill:  11     Orders Placed This Encounter  Procedures  . MR BRAIN W WO CONTRAST  . Comprehensive metabolic panel  . Ambulatory referral to Physical Therapy   Meds ordered this encounter  Medications  . methylPREDNISolone (MEDROL DOSEPAK) 4 MG TBPK tablet    Sig: follow package directions    Dispense:  21 tablet    Refill:  1  . methylPREDNISolone acetate (DEPO-MEDROL) injection 160 mg  . gabapentin (NEURONTIN) 300 MG capsule    Sig: Take 1-2 before bedtime    Dispense:  60 capsule    Refill:  11   All procedures a documented were medically necessary, reasonable and appropriate based on the patient's history, medical diagnosis and physician opinion. Verbal informed consent was obtained from the patient, patient was informed of potential risk of procedure, including bruising, bleeding, hematoma formation, infection, muscle weakness, muscle pain, numbness, transient hypertension, transient hyperglycemia and transient insomnia among others. All areas  injected were topically clean with isopropyl rubbing alcohol. Nonsterile nonlatex gloves were worn during the procedure.  1. Greater occipital nerve block [phone]). The greater occipital nerve site was identified at the nuchal line medial to the occipital artery. Medication was injected into the left and right occipital nerve areas and suboccipital areas. Patient's condition is associated with inflammation of the greater occipital nerve and associated multiple groups. Injection was deemed medically necessary, reasonable and appropriate. Injection represents a separate and unique surgical service.  2. Auriculotemporal nerve block (54627): The Auriculotemporal nerve site was identified along the posterior margin of the sternocleidomastoid muscle toward the base of the ear. Medication was injected into the left and right radicular temporal nerve areas. Patient's condition is associated with inflammation of the Auriculotemporal Nerve and associated muscle groups. Injection was deemed medically necessary, reasonable and appropriate. Injection represents a separate and unique surgical service.  Discussed: To prevent or relieve headaches, try the  following: Cool Compress. Lie down and place a cool compress on your head.  Avoid headache triggers. If certain foods or odors seem to have triggered your migraines in the past, avoid them. A headache diary might help you identify triggers.  Include physical activity in your daily routine. Try a daily walk or other moderate aerobic exercise.  Manage stress. Find healthy ways to cope with the stressors, such as delegating tasks on your to-do list.  Practice relaxation techniques. Try deep breathing, yoga, massage and visualization.  Eat regularly. Eating regularly scheduled meals and maintaining a healthy diet might help prevent headaches. Also, drink plenty of fluids.  Follow a regular sleep schedule. Sleep deprivation might contribute to headaches Consider  biofeedback. With this mind-body technique, you learn to control certain bodily functions - such as muscle tension, heart rate and blood pressure - to prevent headaches or reduce headache pain.    Proceed to emergency room if you experience new or worsening symptoms or symptoms do not resolve, if you have new neurologic symptoms or if headache is severe, or for any concerning symptom.   Provided education and documentation from American headache Society toolbox including articles on: chronic migraine medication overuse headache, chronic migraines, prevention of migraines, behavioral and other nonpharmacologic treatments for headache.   Sarina Ill, MD  [facility] [address] [place], [place] [address]  Phone [phone] Fax [phone]

## 2017-09-26 NOTE — Progress Notes (Signed)
Nerve block w/ steroid: Pt signed consent  0.5% Bupivocaine 3.5 mL LOT: [id number] EXP: 09/22  2% Lidocaine 3.5 mL LOT: [id number] EXP: 03/26/2019  Depomedrol 80 mg/ 1 mL x 2  See MAR

## 2017-09-26 NOTE — Patient Instructions (Addendum)
MRI brain w/wo contrast One lab today B12 1000-2039mcg daily and fu with pcp for retest in 3 months   Methylprednisolone tablets What is this medicine? METHYLPREDNISOLONE (meth ill pred NISS [place]) is a corticosteroid. It is commonly used to treat inflammation of the skin, joints, lungs, and other organs. Common conditions treated include asthma, allergies, and arthritis. It is also used for other conditions, such as blood disorders and diseases of the adrenal glands. This medicine may be used for other purposes; ask your health care provider or pharmacist if you have questions. COMMON BRAND NAME(S): Medrol, Medrol Dosepak What should I tell my health care provider before I take this medicine? They need to know if you have any of these conditions: -Cushing's syndrome -eye disease, vision problems -diabetes -glaucoma -heart disease -high blood pressure -infection (especially a virus infection such as chickenpox, cold sores, or herpes) -liver disease -mental illness -myasthenia gravis -osteoporosis -recently received or scheduled to receive a vaccine -seizures -stomach or intestine problems -thyroid disease -an unusual or allergic reaction to lactose, methylprednisolone, other medicines, foods, dyes, or preservatives -pregnant or trying to get pregnant -breast-feeding How should I use this medicine? Take this medicine by mouth with a glass of water. Follow the directions on the prescription label. Take this medicine with food. If you are taking this medicine once a day, take it in the morning. Do not take it more often than directed. Do not suddenly stop taking your medicine because you may develop a severe reaction. Your doctor will tell you how much medicine to take. If your doctor wants you to stop the medicine, the dose may be slowly lowered over time to avoid any side effects. Talk to your pediatrician regarding the use of this medicine in children. Special care may be  needed. Overdosage: If you think you have taken too much of this medicine contact a poison control center or emergency room at once. NOTE: This medicine is only for you. Do not share this medicine with others. What if I miss a dose? If you miss a dose, take it as soon as you can. If it is almost time for your next dose, talk to your doctor or health care professional. You may need to miss a dose or take an extra dose. Do not take double or extra doses without advice. What may interact with this medicine? Do not take this medicine with any of the following medications: -alefacept -echinacea -live virus vaccines -metyrapone -mifepristone This medicine may also interact with the following medications: -amphotericin B -aspirin and aspirin-like medicines -certain antibiotics like erythromycin, clarithromycin, troleandomycin -certain medicines for diabetes -certain medicines for fungal infections like ketoconazole -certain medicines for seizures like carbamazepine, phenobarbital, phenytoin -certain medicines that treat or prevent blood clots like warfarin -cholestyramine -cyclosporine -digoxin -diuretics -female hormones, like estrogens and birth control pills -isoniazid -NSAIDs, medicines for pain inflammation, like ibuprofen or naproxen -other medicines for myasthenia gravis -rifampin -vaccines This list may not describe all possible interactions. Give your health care provider a list of all the medicines, herbs, non-prescription drugs, or dietary supplements you use. Also tell them if you smoke, drink alcohol, or use illegal drugs. Some items may interact with your medicine. What should I watch for while using this medicine? Tell your doctor or healthcare professional if your symptoms do not start to get better or if they get worse. Do not stop taking except on your doctor's advice. You may develop a severe reaction. Your doctor will tell you how much  medicine to take. This medicine may  increase your risk of getting an infection. Tell your doctor or health care professional if you are around anyone with measles or chickenpox, or if you develop sores or blisters that do not heal properly. This medicine may affect blood sugar levels. If you have diabetes, check with your doctor or health care professional before you change your diet or the dose of your diabetic medicine. Tell your doctor or health care professional right away if you have any change in your eyesight. Using this medicine for a long time may increase your risk of low bone mass. Talk to your doctor about bone health. What side effects may I notice from receiving this medicine? Side effects that you should report to your doctor or health care professional as soon as possible: -allergic reactions like skin rash, itching or hives, swelling of the face, lips, or tongue -bloody or tarry stools -changes in vision -hallucination, loss of contact with reality -muscle cramps -muscle pain -palpitations -signs and symptoms of high blood sugar such as dizziness; dry mouth; dry skin; fruity breath; nausea; stomach pain; increased hunger or thirst; increased urination -signs and symptoms of infection like fever or chills; cough; sore throat; pain or trouble passing urine -trouble passing urine or change in the amount of urine Side effects that usually do not require medical attention (report to your doctor or health care professional if they continue or are bothersome): -changes in emotions or mood -constipation -diarrhea -excessive hair growth on the face or body -headache -nausea, vomiting -trouble sleeping -weight gain This list may not describe all possible side effects. Call your doctor for medical advice about side effects. You may report side effects to FDA at 1-800-FDA-1088. Where should I keep my medicine? Keep out of the reach of children. Store at room temperature between 20 and 25 degrees C (68 and 77 degrees  F). Throw away any unused medicine after the expiration date. NOTE: This sheet is a summary. It may not cover all possible information. If you have questions about this medicine, talk to your doctor, pharmacist, or health care provider.  2018 Elsevier/Gold Standard (2015-04-17 15:53:30)  Gabapentin capsules or tablets What is this medicine? GABAPENTIN (GA ba pen tin) is used to control partial seizures in adults with epilepsy. It is also used to treat certain types of nerve pain. This medicine may be used for other purposes; ask your health care provider or pharmacist if you have questions. COMMON BRAND NAME(S): Active-PAC with Gabapentin, Gabarone, Neurontin What should I tell my health care provider before I take this medicine? They need to know if you have any of these conditions: -kidney disease -suicidal thoughts, plans, or attempt; a previous suicide attempt by you or a family member -an unusual or allergic reaction to gabapentin, other medicines, foods, dyes, or preservatives -pregnant or trying to get pregnant -breast-feeding How should I use this medicine? Take this medicine by mouth with a glass of water. Follow the directions on the prescription label. You can take it with or without food. If it upsets your stomach, take it with food.Take your medicine at regular intervals. Do not take it more often than directed. Do not stop taking except on your doctor's advice. If you are directed to break the 600 or 800 mg tablets in half as part of your dose, the extra half tablet should be used for the next dose. If you have not used the extra half tablet within 28 days, it should be thrown  away. A special MedGuide will be given to you by the pharmacist with each prescription and refill. Be sure to read this information carefully each time. Talk to your pediatrician regarding the use of this medicine in children. Special care may be needed. Overdosage: If you think you have taken too much of  this medicine contact a poison control center or emergency room at once. NOTE: This medicine is only for you. Do not share this medicine with others. What if I miss a dose? If you miss a dose, take it as soon as you can. If it is almost time for your next dose, take only that dose. Do not take double or extra doses. What may interact with this medicine? Do not take this medicine with any of the following medications: -other gabapentin products This medicine may also interact with the following medications: -alcohol -antacids -antihistamines for allergy, cough and cold -certain medicines for anxiety or sleep -certain medicines for depression or psychotic disturbances -homatropine; hydrocodone -naproxen -narcotic medicines (opiates) for pain -phenothiazines like chlorpromazine, mesoridazine, prochlorperazine, thioridazine This list may not describe all possible interactions. Give your health care provider a list of all the medicines, herbs, non-prescription drugs, or dietary supplements you use. Also tell them if you smoke, drink alcohol, or use illegal drugs. Some items may interact with your medicine. What should I watch for while using this medicine? Visit your doctor or health care professional for regular checks on your progress. You may want to keep a record at home of how you feel your condition is responding to treatment. You may want to share this information with your doctor or health care professional at each visit. You should contact your doctor or health care professional if your seizures get worse or if you have any new types of seizures. Do not stop taking this medicine or any of your seizure medicines unless instructed by your doctor or health care professional. Stopping your medicine suddenly can increase your seizures or their severity. Wear a medical identification bracelet or chain if you are taking this medicine for seizures, and carry a card that lists all your  medications. You may get drowsy, dizzy, or have blurred vision. Do not drive, use machinery, or do anything that needs mental alertness until you know how this medicine affects you. To reduce dizzy or fainting spells, do not sit or stand up quickly, especially if you are an older patient. Alcohol can increase drowsiness and dizziness. Avoid alcoholic drinks. Your mouth may get dry. Chewing sugarless gum or sucking hard candy, and drinking plenty of water will help. The use of this medicine may increase the chance of suicidal thoughts or actions. Pay special attention to how you are responding while on this medicine. Any worsening of mood, or thoughts of suicide or dying should be reported to your health care professional right away. Women who become pregnant while using this medicine may enroll in the [place] Pregnancy Registry by calling [phone]. This registry collects information about the safety of antiepileptic drug use during pregnancy. What side effects may I notice from receiving this medicine? Side effects that you should report to your doctor or health care professional as soon as possible: -allergic reactions like skin rash, itching or hives, swelling of the face, lips, or tongue -worsening of mood, thoughts or actions of suicide or dying Side effects that usually do not require medical attention (report to your doctor or health care professional if they continue or are bothersome): -constipation -difficulty  walking or controlling muscle movements -dizziness -nausea -slurred speech -tiredness -tremors -weight gain This list may not describe all possible side effects. Call your doctor for medical advice about side effects. You may report side effects to FDA at 1-800-FDA-1088. Where should I keep my medicine? Keep out of reach of children. This medicine may cause accidental overdose and death if it taken by other adults, children, or pets. Mix any unused  medicine with a substance like cat litter or coffee grounds. Then throw the medicine away in a sealed container like a sealed bag or a coffee can with a lid. Do not use the medicine after the expiration date. Store at room temperature between 15 and 30 degrees C (59 and 86 degrees F). NOTE: This sheet is a summary. It may not cover all possible information. If you have questions about this medicine, talk to your doctor, pharmacist, or health care provider.  2018 Elsevier/Gold Standard (2013-04-06 15:26:50)  Occipital Neuralgia Occipital neuralgia is a type of headache that causes episodes of very bad pain in the back of your head. Pain from occipital neuralgia may spread (radiate) to other parts of your head. The pain is usually brief and often goes away after you rest and relax. These headaches may be caused by irritation of the nerves that leave your spinal cord high up in your neck, just below the base of your skull (occipital nerves). Your occipital nerves transmit sensations from the back of your head, the top of your head, and the areas behind your ears. What are the causes? Occipital neuralgia can occur without any known cause (primary headache syndrome). In other cases, occipital neuralgia is caused by pressure on or irritation of one of the two occipital nerves. Causes of occipital nerve compression or irritation include:  Wear and tear of the vertebrae in the neck (osteoarthritis).  Neck injury.  Disease of the disks that separate the vertebrae.  Tumors.  Gout.  Infections.  Diabetes.  Swollen blood vessels that put pressure on the occipital nerves.  Muscle spasm in the neck.  What are the signs or symptoms? Pain is the main symptom of occipital neuralgia. It usually starts in the back of the head but may also be felt in other areas supplied by the occipital nerves. Pain is usually on one side but may be on both sides. You may have:  Brief episodes of very bad pain that is  burning, stabbing, shocking, or shooting.  Pain behind the eye.  Pain triggered by neck movement or hair brushing.  Scalp tenderness.  Aching in the back of the head between episodes of very bad pain.  How is this diagnosed? Your health care provider may diagnose occipital neuralgia based on your symptoms and a physical exam. During the exam, the health care provider may push on areas supplied by the occipital nerves to see if they are painful. Some tests may also be done to help in making the diagnosis. These may include:  Imaging studies of the upper spinal cord, such as an MRI or CT scan. These may show compression or spinal cord abnormalities.  Nerve block. You will get an injection of numbing medicine (local anesthetic) near the occipital nerve to see if this relieves pain.  How is this treated? Treatment may begin with simple measures, such as:  Rest.  Massage.  Heat.  Over-the-counter pain relievers.  If these measures do not work, you may need other treatments, including:  Medicines such as: ? Prescription-strength anti-inflammatory medicines. ?  Muscle relaxants. ? Antiseizure medicines. ? Antidepressants.  Steroid injection. This involves injections of local anesthetic and strong anti-inflammatory drugs (steroids).  Pulsed radiofrequency. Wires are implanted to deliver electrical impulses that block pain signals from the occipital nerve.  Physical therapy.  Surgery to relieve nerve pressure.  Follow these instructions at home:  Take all medicines as directed by your health care provider.  Avoid activities that cause pain.  Rest when you have an attack of pain.  Try gentle massage or a heating pad to relieve pain.  Work with a physical therapist to learn stretching exercises you can do at home.  Try a different pillow or sleeping position.  Practice good posture.  Try to stay active. Get regular exercise that does not cause pain. Ask your health  care provider to suggest safe exercises for you.  Keep all follow-up visits as directed by your health care provider. This is important. Contact a health care provider if:  Your medicine is not working.  You have new or worsening symptoms. Get help right away if:  You have very bad head pain that is not going away.  You have a sudden change in vision, balance, or speech. This information is not intended to replace advice given to you by your health care provider. Make sure you discuss any questions you have with your health care provider. Document Released: 02/02/2001 Document Revised: 07/17/2015 Document Reviewed: 01/31/2013 Elsevier Interactive Patient Education  2017 Else[employer] Inc.    Vitamin B12 Deficiency Vitamin B12 deficiency means that your body is not getting enough vitamin B12. Your body needs vitamin B12 for important bodily functions. If you do not have enough vitamin B12 in your body, you can have health problems. Follow these instructions at home:  Take supplements only as told by your doctor. Follow the directions carefully.  Get any shots (injections) as told by your doctor. Do not miss your visits to the doctor.  Eat lots of healthy foods that contain vitamin B12. Ask your doctor if you should work with someone who is trained in how food affects health (dietitian). Foods that contain vitamin B12 include: ? Meat. ? Meat from birds (poultry). ? Fish. ? Eggs. ? Cereal and dairy products that are fortified. This means that vitamin B12 has been added to the food. Check the label on the package to see if the food is fortified.  Do not drink too much (do not abuse) alcohol.  Keep all follow-up visits as told by your doctor. This is important. Contact a doctor if:  Your symptoms come back. Get help right away if:  You have trouble breathing.  You have chest pain.  You get dizzy.  You pass out (lose consciousness). This information is not intended to replace  advice given to you by your health care provider. Make sure you discuss any questions you have with your health care provider. Document Released: 01/28/2011 Document Revised: 07/17/2015 Document Reviewed: 06/26/2014 Elsevier Interactive Patient Education  [employer].

## 2017-09-26 NOTE — Telephone Encounter (Signed)
[employer] order sent to [facility]. They will reach out to the pt to schedule.  °

## 2017-09-27 LAB — COMPREHENSIVE METABOLIC PANEL
ALT: 13 IU/L (ref 0–32)
AST: 21 IU/L (ref 0–40)
Albumin/Globulin Ratio: 1.7 (ref 1.2–2.2)
Albumin: 4 g/dL (ref 3.6–4.8)
Alkaline Phosphatase: 88 IU/L (ref 39–117)
BUN/Creatinine Ratio: 28 (ref 12–28)
BUN: 16 mg/dL (ref 8–27)
Bilirubin Total: 0.2 mg/dL (ref 0.0–1.2)
CO2: 25 mmol/L (ref 20–29)
Calcium: 9.3 mg/dL (ref 8.7–10.3)
Chloride: 104 mmol/L (ref 96–106)
Creatinine, Ser: 0.58 mg/dL (ref 0.57–1.00)
GFR calc Af Amer: 115 mL/min/{1.73_m2} (ref >59)
GFR calc non Af Amer: 100 mL/min/{1.73_m2} (ref >59)
Globulin, Total: 2.4 g/dL (ref 1.5–4.5)
Glucose: 95 mg/dL (ref 65–99)
Potassium: 4.1 mmol/L (ref 3.5–5.2)
Sodium: 143 mmol/L (ref 134–144)
Total Protein: 6.4 g/dL (ref 6.0–8.5)

## 2017-10-02 ENCOUNTER — Ambulatory Visit
Admission: RE | Admit: 2017-10-02 | Discharge: 2017-10-02 | Disposition: A | Discharge status: Home / Self Care | Payer: Medicare Other | Source: Ambulatory Visit | Attending: Neurology | Admitting: Neurology

## 2017-10-02 DIAGNOSIS — R51 Headache with orthostatic component, not elsewhere classified: Secondary | ICD-10-CM

## 2017-10-02 DIAGNOSIS — G8929 Other chronic pain: Secondary | ICD-10-CM

## 2017-10-02 DIAGNOSIS — R519 Headache, unspecified: Secondary | ICD-10-CM

## 2017-10-02 DIAGNOSIS — E538 Deficiency of other specified B group vitamins: Secondary | ICD-10-CM

## 2017-10-02 MED ORDER — GADOBENATE DIMEGLUMINE 529 MG/ML IV SOLN
20.0000 mL | Freq: Once | INTRAVENOUS | Status: AC | PRN
  Administered 2017-10-02: 20 mL via INTRAVENOUS

## 2017-10-18 DIAGNOSIS — M5481 Occipital neuralgia: Secondary | ICD-10-CM

## 2017-10-18 DIAGNOSIS — R51 Headache with orthostatic component, not elsewhere classified: Secondary | ICD-10-CM

## 2017-10-18 DIAGNOSIS — R519 Headache, unspecified: Secondary | ICD-10-CM

## 2017-10-20 NOTE — Telephone Encounter (Signed)
Will refer to Dr. Laurence Spates for nerve block. Larey Seat, MD

## 2017-10-20 NOTE — Telephone Encounter (Signed)
From Dr. Cathren Laine previous email to pt:   Dr. Laurence Spates who can do injections in the neck called c3/c4 medial branch blocks that also help with occipital neuralgia

## 2017-10-27 DIAGNOSIS — M0589 Other rheumatoid arthritis with rheumatoid factor of multiple sites: Secondary | ICD-10-CM | POA: Diagnosis not present

## 2017-11-02 ENCOUNTER — Ambulatory Visit: Payer: Medicare Other | Admitting: Neurology

## 2017-11-02 ENCOUNTER — Encounter

## 2017-11-04 ENCOUNTER — Ambulatory Visit (INDEPENDENT_AMBULATORY_CARE_PROVIDER_SITE_OTHER): Payer: Medicare Other | Admitting: Physical Medicine and Rehabilitation

## 2017-11-04 ENCOUNTER — Encounter (INDEPENDENT_AMBULATORY_CARE_PROVIDER_SITE_OTHER): Payer: Self-pay | Admitting: Physical Medicine and Rehabilitation

## 2017-11-04 ENCOUNTER — Telehealth (INDEPENDENT_AMBULATORY_CARE_PROVIDER_SITE_OTHER): Payer: Self-pay | Admitting: Physical Medicine and Rehabilitation

## 2017-11-04 VITALS — BP 156/86 | HR 58

## 2017-11-04 DIAGNOSIS — M542 Cervicalgia: Secondary | ICD-10-CM

## 2017-11-04 DIAGNOSIS — M47812 Spondylosis without myelopathy or radiculopathy, cervical region: Secondary | ICD-10-CM | POA: Diagnosis not present

## 2017-11-04 DIAGNOSIS — M7918 Myalgia, other site: Secondary | ICD-10-CM | POA: Diagnosis not present

## 2017-11-04 DIAGNOSIS — M5481 Occipital neuralgia: Secondary | ICD-10-CM | POA: Diagnosis not present

## 2017-11-04 NOTE — Progress Notes (Signed)
  Numeric Pain Rating Scale and Functional Assessment Average Pain 5 Pain Right Now 5 My pain is constant, sharp, dull and aching Pain is worse with: unsure Pain improves with: nothing   In the last MONTH (on 0-10 scale) has pain interfered with the following?  1. General activity like being  able to carry out your everyday physical activities such as walking, climbing stairs, carrying groceries, or moving a chair?  Rating(3)  2. Relation with others like being able to carry out your usual social activities and roles such as  activities at home, at work and in your community. Rating(3)  3. Enjoyment of life such that you have  been bothered by emotional problems such as feeling anxious, depressed or irritable?  Rating(10)

## 2017-11-04 NOTE — Progress Notes (Signed)
[name] - 62 y.o. female MRN [id number]  Date of birth: 1955-04-09  Office Visit Note: Visit Date: 11/04/2017 PCP: Eulas Post, MD Referred by: Eulas Post, MD  Subjective: Chief Complaint  Patient presents with  . Head - Pain  . Neck - Pain   HPI: [name] is a 62 year old right-hand-dominant female who comes in today to request of Dr. Delfin Edis for evaluation management with particular consultation for occipital neuralgia and cervical spondylosis.  Patient reports chronic worsening fairly constant pain that radiates from the posterior neck in the upper cervical region to the occiput with lancinating type pain across both occipital regions bilaterally.  She does not really endorse one side more than the other.  She has tried and failed medication management.  She has had local greater occipital nerve blocks as well as auriculotemporal nerve blocks without much relief according to the patient.  The patient has had medication trials with nortriptyline and Flexeril and other nerve pain type medications.  Tylenol helps to a degree.  Ongoing symptoms now for several months without specific injury.  No radicular complaints.  No focal weakness.  MRI of the cervical spine has been performed and this is reviewed with the patient and reviewed below.  She does have some upper cervical spondylitic change.  She has had therapy in the past but no specific dry needling.  She denies any facial pain or vision changes.   Review of Systems  Constitutional: Negative for chills, fever, malaise/fatigue and weight loss.  HENT: Negative for hearing loss and sinus pain.   Eyes: Negative for blurred vision, double vision and photophobia.  Respiratory: Negative for cough and shortness of breath.   Cardiovascular: Negative for chest pain, palpitations and leg swelling.  Gastrointestinal: Negative for abdominal pain, nausea and vomiting.  Genitourinary: Negative for flank pain.    Musculoskeletal: Positive for neck pain. Negative for myalgias.  Skin: Negative for itching and rash.  Neurological: Positive for headaches. Negative for tremors, focal weakness and weakness.  Endo/Heme/Allergies: Negative.   Psychiatric/Behavioral: Negative for depression.  All other systems reviewed and are negative.  Otherwise per HPI.  Assessment & Plan: Visit Diagnoses:  1. Bilateral occipital neuralgia   2. Cervical spondylosis without myelopathy   3. Cervicalgia   4. Myofascial pain syndrome     Plan: Findings:  Chronic several month history of severe occipital headaches not relieved with greater occipital nerve blocks and medication trials.  She does have consistent findings with potential for occipital neuralgia.  She does endorse at times dizziness and nausea.  It does even affect her at night when trying to get to sleep.  Average pain is a 5 out of 10 when she really reports this is a constant sharp and dull pain with aching.  I think she would represent a good candidate for trial of C2-3 and C3-4 medial branch blocks.  This would block the third occipital nerve as well.  We will do this bilaterally.  We went over this at length today of the far as the risk-benefit and out of procedures done.  She does want to proceed.  We will prescribe a small amount of oral sedation for the injection.  We will use a pain diary as well.  She could ultimately be a candidate for radiofrequency ablation of these upper cervical facets.  Other avenues might be occipital nerve peripheral nerve stimulation.  We are not really doing that in our office.  Trigger point treatment may also  be utilized.    Meds & Orders: No orders of the defined types were placed in this encounter.  No orders of the defined types were placed in this encounter.   Follow-up: Return for Bilateral C2-3 and C3-4 medial branch blocks..   Procedures: No procedures performed  No notes on file   Clinical History: MRI CERVICAL  SPINE WITHOUT CONTRAST  TECHNIQUE: Multiplanar, multisequence MR imaging of the cervical spine was performed. No intravenous contrast was administered.  COMPARISON:  Cervical spine radiographs 02/21/2015  FINDINGS: Vertebral alignment is unchanged and near anatomic. Vertebral body heights and intervertebral disc space heights are preserved. Vertebral bone marrow signal is unremarkable. Craniocervical junction is unremarkable. The cervical spinal cord is normal in caliber and signal. Paraspinal soft tissues are unremarkable.  C2-3:  Negative.  C3-4: At most minimal left facet arthrosis without disc herniation or stenosis.  C4-5: Minimal disc bulging, minimal right uncovertebral spurring, and minimal facet arthrosis result in minimal right neural foraminal narrowing without spinal stenosis.  C5-6:  Negative.  C6-7:  Negative.  C7-T1: Mild left greater than right facet arthrosis without disc herniation or stenosis.  IMPRESSION: Minimal cervical spondylosis and facet arthrosis without evidence of neural impingement.   Electronically Signed   By: Logan Bores M.D.   On: 04/18/2015 08:05   She reports that she quit smoking about 33 years ago. Her smoking use included cigarettes. She has a 1.00 pack-year smoking history. She has never used smokeless tobacco. No results for input(s): HGBA1C, LABURIC in the last 8760 hours.  Objective:  VS:  HT:    WT:   BMI:     BP:(!) 156/86  HR:(!) 58bpm  TEMP: ( )  RESP:  Physical Exam  Constitutional: She is oriented to person, place, and time. She appears well-developed and well-nourished. No distress.  HENT:  Head: Normocephalic and atraumatic.  Nose: Nose normal.  Mouth/Throat: Oropharynx is clear and moist.  Eyes: Pupils are equal, round, and reactive to light. Conjunctivae are normal.  Neck: Normal range of motion. Neck supple.  Cardiovascular: Regular rhythm and intact distal pulses.  Pulmonary/Chest: Effort  normal. No respiratory distress.  Abdominal: She exhibits no distension. There is no guarding.  Musculoskeletal:  Cervical spine exam shows the patient with forward flexed cervical spine decent range of motion with rotation bilaterally pain at end ranges.  Negative Spurling's test.  She does get more pain with prolonged extension.  She has positive trigger points noted.  She has no shoulder impingement.  Good strength otherwise.  Neurological: She is alert and oriented to person, place, and time. She exhibits normal muscle tone. Coordination normal.  Skin: Skin is warm. No rash noted. No erythema.  Psychiatric: She has a normal mood and affect. Her behavior is normal.  Nursing note and vitals reviewed.   Ortho Exam Imaging: No results found.  Past Medical/Family/Surgical/Social History: Medications & Allergies reviewed per EMR, new medications updated. Patient Active Problem List   Diagnosis Date Noted  . Bilateral occipital neuralgia 09/26/2017  . Status post left foot surgery 07/07/2015  . Neuroma 05/20/2015  . HAV (hallux abducto valgus) 05/20/2015  . Hammertoe 05/20/2015  . Prominent metatarsal head 05/20/2015  . Chronic foot pain 05/20/2015  . Anxiety state 03/03/2015  . Hip bursitis 10/22/2014  . Osteophyte of hip 10/21/2014  . Epigastric cramping 11/09/2013  . Nausea with vomiting 11/06/2013  . Rheumatoid arthritis ([place]) 11/01/2013  . S/P laparoscopic sleeve gastrectomy 10/30/2013  . Anemia 06/04/2013  . CAP (community acquired  pneumonia) 02/17/2013  . Restless leg syndrome 09/18/2012  . Morbid obesity with BMI of 45.0-49.9, adult ([place]) 01/18/2012  . Right gluteus tear 01/17/2012  . Nipple discharge in female 07/08/2011  . Chronic insomnia 06/18/2011  . Discharge from nipple 01/06/2011  . TMJ PAIN 01/12/2010  . DEPRESSION 12/10/2009  . SINUSITIS, ACUTE 12/30/2008  . Lumbago 12/30/2008  . EDEMA 10/03/2008  . Hyperlipidemia 08/27/2008  . Essential hypertension  08/27/2008  . GERD 08/27/2008   Past Medical History:  Diagnosis Date  . ANGIOMA 10/03/2008   REMOVED FROM RIGHT LOWER LEG-BENIGN  . Anxiety   . Arthritis    RA  . Bronchitis    hx of   . Cancer (HCC)    BASAL CELL SKIN CANCER  . Complication of anesthesia   . Fibromyalgia   . GERD 08/27/2008  . History of blood transfusion    1995  . History of hiatal hernia    hx of has had surgically repaired  . History of measles   . History of mumps   . History of nonmelanoma skin cancer   . HYPERLIPIDEMIA 08/27/2008  . HYPERTENSION 08/27/2008   pt is currently on no meds   . Pneumonia    hx of   . PONV (postoperative nausea and vomiting)   . Rheumatoid arthritis ([place])   . SEBORRHEIC KERATOSIS 08/27/2008   Family History  Problem Relation Age of Onset  . Cancer Mother        breast  . Breast cancer Mother 52  . Kidney disease Father   . Epilepsy Father   . Heart attack Sister   . Heart attack Sister   . Diabetes Son    Past Surgical History:  Procedure Laterality Date  . ABDOMINAL HYSTERECTOMY  1995  . APPENDECTOMY    . BREAST BIOPSY Left   . BREAST DUCTAL SYSTEM EXCISION  08/16/2011   Procedure: EXCISION DUCTAL SYSTEM BREAST;  Surgeon: Joyice Faster. Cornett, MD;  Location: [facility];  Service: General;  Laterality: Left;  left breast duct excision  . [place]   times 2; 1980 and 1982  . [place]  . CHOLECYSTECTOMY  1997  . EXCISION/RELEASE BURSA HIP Right 10/21/2014   Procedure: OPEN RIGHT HIP BURSECTOMY, EXOSECTOMY;  Surgeon: Paralee Cancel, MD;  Location: [facility];  Service: Orthopedics;  Laterality: Right;  . foot surgery  2004, 2005, 2008   bilat secondary to neuropathy   . JOINT REPLACEMENT  10,12   lt total knee X2  . KNEE ARTHROSCOPY  2007, 2008, 2009  . LAPAROSCOPIC GASTRIC SLEEVE RESECTION N/A 10/30/2013   Procedure: LAPAROSCOPIC GASTRIC SLEEVE RESECTION WITH HIATAL HERNIA REPAIR AND UPPER ENDOSCOPY;  Surgeon: Greer Pickerel, MD;   Location: [facility];  Service: General;  Laterality: N/A;  . NASAL SINUS SURGERY  2013  . OPEN SURGICAL REPAIR OF GLUTEAL TENDON  01/17/2012   Procedure: OPEN SURGICAL REPAIR OF GLUTEAL TENDON;  Surgeon: Mauri Pole, MD;  Location: [facility];  Service: Orthopedics;  Laterality: Right;   Social History   Occupational History  . Occupation: retired    Comment: Building services engineer  Tobacco Use  . Smoking status: Former Smoker    Packs/day: 0.25    Years: 4.00    Pack years: 1.00    Types: Cigarettes    Last attempt to quit: 02/23/1984    Years since quitting: 33.7  . Smokeless tobacco: Never Used  Substance and Sexual Activity  . Alcohol use:  No  . Drug use: No  . Sexual activity: Not on file

## 2017-11-07 ENCOUNTER — Encounter (INDEPENDENT_AMBULATORY_CARE_PROVIDER_SITE_OTHER): Payer: Self-pay | Admitting: Physical Medicine and Rehabilitation

## 2017-11-07 NOTE — Telephone Encounter (Signed)
Per [facility] website. This [employer]vant[employer] members plan does not currently require a prior authorization for [phone] & [phone].

## 2017-11-08 ENCOUNTER — Other Ambulatory Visit (INDEPENDENT_AMBULATORY_CARE_PROVIDER_SITE_OTHER): Payer: Self-pay | Admitting: Physical Medicine and Rehabilitation

## 2017-11-08 MED ORDER — TRIAZOLAM 0.25 MG PO TABS: ORAL_TABLET | ORAL | 0 refills | Status: DC

## 2017-11-08 NOTE — Progress Notes (Signed)
Pre-procedure triazolam ordered for pre-operative anxiety.  

## 2017-11-14 ENCOUNTER — Ambulatory Visit (INDEPENDENT_AMBULATORY_CARE_PROVIDER_SITE_OTHER): Payer: Medicare Other | Admitting: Physical Medicine and Rehabilitation

## 2017-11-14 ENCOUNTER — Ambulatory Visit (INDEPENDENT_AMBULATORY_CARE_PROVIDER_SITE_OTHER): Payer: Self-pay

## 2017-11-14 ENCOUNTER — Encounter (INDEPENDENT_AMBULATORY_CARE_PROVIDER_SITE_OTHER): Payer: Self-pay | Admitting: Physical Medicine and Rehabilitation

## 2017-11-14 VITALS — BP 130/70 | HR 67 | Temp 97.7°F

## 2017-11-14 DIAGNOSIS — M542 Cervicalgia: Secondary | ICD-10-CM

## 2017-11-14 DIAGNOSIS — M47812 Spondylosis without myelopathy or radiculopathy, cervical region: Secondary | ICD-10-CM

## 2017-11-14 MED ORDER — BETAMETHASONE SOD PHOS & ACET 6 (3-3) MG/ML IJ SUSP: 12.0000 mg | Freq: Once | INTRAMUSCULAR | Status: DC

## 2017-11-14 MED ORDER — BUPIVACAINE HCL 0.25 % IJ SOLN: 2.0000 mL | Freq: Once | INTRAMUSCULAR | Status: DC

## 2017-11-14 NOTE — Procedures (Signed)
Diagnostic Cervical Facet Joint Nerve Block with Fluoroscopic Guidance  Patient: [name]      Date of Birth: 02-04-1956 MRN: [id number] PCP: Eulas Post, MD      Visit Date: 11/14/2017   Universal Protocol:    Date/Time: 09/23/199:23 AM  Consent Given By: the patient  Position: PRONE  Additional Comments: Vital signs were monitored before and after the procedure. Patient was prepped and draped in the usual sterile fashion. The correct patient, procedure, and site was verified.   Injection Procedure Details:  Procedure Site One Meds Administered:  Meds ordered this encounter  Medications  . bupivacaine (MARCAINE) 0.25 % (with pres) injection 2 mL  . betamethasone acetate-betamethasone sodium phosphate (CELESTONE) injection 12 mg     Laterality: Bilateral  Location/Site:  C2-3 C3-4  Needle size: 25 G  Needle type: Spinal  Needle Placement: Articular Pillar  Findings:  -Contrast Used: 1 mL iohexol 180 mg iodine/mL   -Comments: Excellent flow of contrast producing a partial arthrogram.  Procedure Details: The fluoroscope beam was positioned to square off the endplates of the desired vertebral level to achieve a true AP position. The beam was then moved in a small "counter" oblique to the contralateral side with a small amount of caudal tilt to achieve a trajectory alignment with the desired nerves.  For each target described below the skin was anesthetized with 1 ml of 1% Lidocaine without epinephrine.   To block the facet joint nerve to C2, the needle was fluoroscopically positioned over the inferior lateral portion of the C2/3 facet joint nerve where the third occipital nerve (TON) lies.  To block the facet joint nerves from C3 through C7, the lateral masses of these respective levels were localized under fluoroscopic visualization.  A spinal needle was inserted down to the "waist" at the above mentioned cervical levels.  The  needle was then "walked  off" until it rested just lateral to the trough of the lateral mass of the medial branch nerve, which innervates the cervical facet joint.  To block the C8 facet joint nerve, the needle was fluoroscopically introduced onto the Tl transverse process at its most medial superior end.  After contact with periosteum and negative aspirate for blood and CSF, correct placement without intravascular or epidural spread was confirmed by Bi-planar images and  injecting 0.5 ml. of Omnipaque-240.  A spot radiograph was obtained of this image.  Next, a 0.5 ml. volume of 1% Lidocaine without Epinephrine was then injected.  Prior to the procedure, the patient was given a Pain Diary which was completed for baseline measurements.  After the procedure, the patient rated their pain every 30 minutes and will continue rating at this frequency for a total of 5 hours.  The patient has been asked to complete the Diary and return to [place] by mail, fax or hand delivered as soon as possible.   Additional Comments:  The patient tolerated the procedure well Dressing: Band-Aid    Post-procedure details: Patient was observed during the procedure. Post-procedure instructions were reviewed.  Patient left the clinic in stable condition.

## 2017-11-14 NOTE — Patient Instructions (Signed)

## 2017-11-14 NOTE — Progress Notes (Signed)
 .  Numeric Pain Rating Scale and Functional Assessment Average Pain 5   In the last MONTH (on 0-10 scale) has pain interfered with the following?  1. General activity like being  able to carry out your everyday physical activities such as walking, climbing stairs, carrying groceries, or moving a chair?  Rating(6)   +Driver, -BT, -Dye Allergies.  

## 2017-11-19 ENCOUNTER — Encounter: Payer: Self-pay | Admitting: Family Medicine

## 2017-11-21 ENCOUNTER — Other Ambulatory Visit: Payer: Self-pay | Admitting: Family Medicine

## 2017-11-21 NOTE — Progress Notes (Signed)
[name] - 62 y.o. female MRN [id number]  Date of birth: Apr 17, 1955  Office Visit Note: Visit Date: 11/14/2017 PCP: Eulas Post, MD Referred by: Eulas Post, MD  Subjective: No chief complaint on file.  HPI: [name] is a 62 year old female that comes in today for planned bilateral C2-3 and C3-4 facet joint/medial branch blocks.  This would include the third occipital nerves.  She reports 6 months of worsening neck pain radiating into the back of the head with failure of greater occipital nerve blocks.  Please see our prior evaluation and management note for further details and justification.   ROS Otherwise per HPI.  Assessment & Plan: Visit Diagnoses:  1. Cervical spondylosis without myelopathy   2. Cervicalgia     Plan: No additional findings.   Meds & Orders:  Meds ordered this encounter  Medications  . bupivacaine (MARCAINE) 0.25 % (with pres) injection 2 mL  . betamethasone acetate-betamethasone sodium phosphate (CELESTONE) injection 12 mg    Orders Placed This Encounter  Procedures  . Nerve Block  . XR C-ARM NO REPORT    Follow-up: Return if symptoms worsen or fail to improve, for Dr. Jaynee Eagles.   Procedures: No procedures performed  Diagnostic Cervical Facet Joint Nerve Block with Fluoroscopic Guidance  Patient: [name]      Date of Birth: 19-Sep-1955 MRN: [id number] PCP: Eulas Post, MD      Visit Date: 11/14/2017   Universal Protocol:    Date/Time: 09/23/199:23 AM  Consent Given By: the patient  Position: PRONE  Additional Comments: Vital signs were monitored before and after the procedure. Patient was prepped and draped in the usual sterile fashion. The correct patient, procedure, and site was verified.   Injection Procedure Details:  Procedure Site One Meds Administered:  Meds ordered this encounter  Medications  . bupivacaine (MARCAINE) 0.25 % (with pres) injection 2 mL  . betamethasone  acetate-betamethasone sodium phosphate (CELESTONE) injection 12 mg     Laterality: Bilateral  Location/Site:  C2-3 C3-4  Needle size: 25 G  Needle type: Spinal  Needle Placement: Articular Pillar  Findings:  -Contrast Used: 1 mL iohexol 180 mg iodine/mL   -Comments: Excellent flow of contrast producing a partial arthrogram.  Procedure Details: The fluoroscope beam was positioned to square off the endplates of the desired vertebral level to achieve a true AP position. The beam was then moved in a small "counter" oblique to the contralateral side with a small amount of caudal tilt to achieve a trajectory alignment with the desired nerves.  For each target described below the skin was anesthetized with 1 ml of 1% Lidocaine without epinephrine.   To block the facet joint nerve to C2, the needle was fluoroscopically positioned over the inferior lateral portion of the C2/3 facet joint nerve where the third occipital nerve (TON) lies.  To block the facet joint nerves from C3 through C7, the lateral masses of these respective levels were localized under fluoroscopic visualization.  A spinal needle was inserted down to the "waist" at the above mentioned cervical levels.  The  needle was then "walked off" until it rested just lateral to the trough of the lateral mass of the medial branch nerve, which innervates the cervical facet joint.  To block the C8 facet joint nerve, the needle was fluoroscopically introduced onto the Tl transverse process at its most medial superior end.  After contact with periosteum and negative aspirate for blood and CSF, correct placement without intravascular  or epidural spread was confirmed by Bi-planar images and  injecting 0.5 ml. of Omnipaque-240.  A spot radiograph was obtained of this image.  Next, a 0.5 ml. volume of 1% Lidocaine without Epinephrine was then injected.  Prior to the procedure, the patient was given a Pain Diary which was completed for baseline  measurements.  After the procedure, the patient rated their pain every 30 minutes and will continue rating at this frequency for a total of 5 hours.  The patient has been asked to complete the Diary and return to [place] by mail, fax or hand delivered as soon as possible.   Additional Comments:  The patient tolerated the procedure well Dressing: Band-Aid    Post-procedure details: Patient was observed during the procedure. Post-procedure instructions were reviewed.  Patient left the clinic in stable condition.      Clinical History: MRI CERVICAL SPINE WITHOUT CONTRAST  TECHNIQUE: Multiplanar, multisequence MR imaging of the cervical spine was performed. No intravenous contrast was administered.  COMPARISON:  Cervical spine radiographs 02/21/2015  FINDINGS: Vertebral alignment is unchanged and near anatomic. Vertebral body heights and intervertebral disc space heights are preserved. Vertebral bone marrow signal is unremarkable. Craniocervical junction is unremarkable. The cervical spinal cord is normal in caliber and signal. Paraspinal soft tissues are unremarkable.  C2-3:  Negative.  C3-4: At most minimal left facet arthrosis without disc herniation or stenosis.  C4-5: Minimal disc bulging, minimal right uncovertebral spurring, and minimal facet arthrosis result in minimal right neural foraminal narrowing without spinal stenosis.  C5-6:  Negative.  C6-7:  Negative.  C7-T1: Mild left greater than right facet arthrosis without disc herniation or stenosis.  IMPRESSION: Minimal cervical spondylosis and facet arthrosis without evidence of neural impingement.   Electronically Signed   By: Logan Bores M.D.   On: 04/18/2015 08:05     Objective:  VS:  HT:    WT:   BMI:     BP:130/70  HR:67bpm  TEMP:97.7 F (36.5 C)(Oral)  RESP:  Physical Exam  Ortho Exam Imaging: No results found.

## 2017-11-21 NOTE — Telephone Encounter (Signed)
Refill OK

## 2017-11-21 NOTE — Telephone Encounter (Signed)
Last OV 09/06/17, No future OV  Last filled 05/16/17, # 90 with 1 refill

## 2017-11-25 ENCOUNTER — Ambulatory Visit (INDEPENDENT_AMBULATORY_CARE_PROVIDER_SITE_OTHER): Payer: Medicare Other

## 2017-11-25 DIAGNOSIS — Z23 Encounter for immunization: Secondary | ICD-10-CM | POA: Diagnosis not present

## 2017-11-25 DIAGNOSIS — M542 Cervicalgia: Secondary | ICD-10-CM | POA: Diagnosis not present

## 2017-12-06 DIAGNOSIS — M542 Cervicalgia: Secondary | ICD-10-CM | POA: Diagnosis not present

## 2017-12-13 DIAGNOSIS — M542 Cervicalgia: Secondary | ICD-10-CM | POA: Diagnosis not present

## 2017-12-15 DIAGNOSIS — M9904 Segmental and somatic dysfunction of sacral region: Secondary | ICD-10-CM | POA: Diagnosis not present

## 2017-12-15 DIAGNOSIS — M9901 Segmental and somatic dysfunction of cervical region: Secondary | ICD-10-CM | POA: Diagnosis not present

## 2017-12-15 DIAGNOSIS — M9905 Segmental and somatic dysfunction of pelvic region: Secondary | ICD-10-CM | POA: Diagnosis not present

## 2017-12-15 DIAGNOSIS — M9903 Segmental and somatic dysfunction of lumbar region: Secondary | ICD-10-CM | POA: Diagnosis not present

## 2017-12-15 DIAGNOSIS — M5137 Other intervertebral disc degeneration, lumbosacral region: Secondary | ICD-10-CM | POA: Diagnosis not present

## 2017-12-21 DIAGNOSIS — M9901 Segmental and somatic dysfunction of cervical region: Secondary | ICD-10-CM | POA: Diagnosis not present

## 2017-12-21 DIAGNOSIS — M9903 Segmental and somatic dysfunction of lumbar region: Secondary | ICD-10-CM | POA: Diagnosis not present

## 2017-12-26 DIAGNOSIS — M9903 Segmental and somatic dysfunction of lumbar region: Secondary | ICD-10-CM | POA: Diagnosis not present

## 2017-12-26 DIAGNOSIS — M9901 Segmental and somatic dysfunction of cervical region: Secondary | ICD-10-CM | POA: Diagnosis not present

## 2017-12-26 DIAGNOSIS — M9902 Segmental and somatic dysfunction of thoracic region: Secondary | ICD-10-CM | POA: Diagnosis not present

## 2017-12-26 DIAGNOSIS — M50322 Other cervical disc degeneration at C5-C6 level: Secondary | ICD-10-CM | POA: Diagnosis not present

## 2018-01-02 DIAGNOSIS — M9903 Segmental and somatic dysfunction of lumbar region: Secondary | ICD-10-CM | POA: Diagnosis not present

## 2018-01-02 DIAGNOSIS — M9901 Segmental and somatic dysfunction of cervical region: Secondary | ICD-10-CM | POA: Diagnosis not present

## 2018-01-02 DIAGNOSIS — M9902 Segmental and somatic dysfunction of thoracic region: Secondary | ICD-10-CM | POA: Diagnosis not present

## 2018-01-02 DIAGNOSIS — M50322 Other cervical disc degeneration at C5-C6 level: Secondary | ICD-10-CM | POA: Diagnosis not present

## 2018-01-05 ENCOUNTER — Encounter: Payer: Self-pay | Admitting: Family Medicine

## 2018-01-05 ENCOUNTER — Ambulatory Visit (INDEPENDENT_AMBULATORY_CARE_PROVIDER_SITE_OTHER): Payer: Medicare Other | Admitting: Family Medicine

## 2018-01-05 VITALS — BP 122/80 | HR 68 | Temp 98.2°F | Wt 214.0 lb

## 2018-01-05 DIAGNOSIS — R1032 Left lower quadrant pain: Secondary | ICD-10-CM

## 2018-01-05 LAB — POC URINALSYSI DIPSTICK (AUTOMATED)
Bilirubin, UA: NEGATIVE
Glucose, UA: NEGATIVE
Ketones, UA: NEGATIVE
Leukocytes, UA: NEGATIVE
Nitrite, UA: NEGATIVE
Protein, UA: POSITIVE — AB
Spec Grav, UA: >=1.03 — AB (ref 1.010–1.025)
Urobilinogen, UA: 0.2 E.U./dL
pH, UA: 5.5 (ref 5.0–8.0)

## 2018-01-05 NOTE — Progress Notes (Signed)
Subjective:    Patient ID: [name], female    DOB: Feb 01, 1956, 62 y.o.   MRN: [id number]  No chief complaint on file. Patient accompanied by her husband.  HPI Patient was seen today for acute concern.    LLQ abdominal pain  -x2 days  -with back pain, urinary urgency, and constipation.   -pain is sharp, aching, making it difficult to roll over.   -also notes pain in left lower back.   -denies fever, chills, blood in stools or urine, h/o renal calculi, or diverticulitis.  H/o constipation: -has tried "everything".   -endorses small BM this a.m.  -last BM was 2 to 3 days ago with use of enema.   -on Linzess in the past.   -drinking to 3 bottles of water/day and one soda.   -denies history of kidney stones, fever, chills, nausea, vomiting, hematuria, blood in stools, dysuria, or odor.  Past Medical History:  Diagnosis Date  . ANGIOMA 10/03/2008   REMOVED FROM RIGHT LOWER LEG-BENIGN  . Anxiety   . Arthritis    RA  . Bronchitis    hx of   . Cancer (HCC)    BASAL CELL SKIN CANCER  . Complication of anesthesia   . Fibromyalgia   . GERD 08/27/2008  . History of blood transfusion    1995  . History of hiatal hernia    hx of has had surgically repaired  . History of measles   . History of mumps   . History of nonmelanoma skin cancer   . HYPERLIPIDEMIA 08/27/2008  . HYPERTENSION 08/27/2008   pt is currently on no meds   . Pneumonia    hx of   . PONV (postoperative nausea and vomiting)   . Rheumatoid arthritis ([place])   . SEBORRHEIC KERATOSIS 08/27/2008    Allergies  Allergen Reactions  . Bee Venom Anaphylaxis  . Codeine Sulfate Other (See Comments)    GI upset  . Penicillins Rash    ROS General: Denies fever, chills, night sweats, changes in weight, changes in appetite HEENT: Denies headaches, ear pain, changes in vision, rhinorrhea, sore throat CV: Denies CP, palpitations, SOB, orthopnea Pulm: Denies SOB, cough, wheezing GI: Denies nausea, vomiting, diarrhea +  constipation, abdominal pain GU: Denies dysuria, hematuria, frequency, vaginal discharge  +urinary urgency Msk: Denies muscle cramps, joint pains  + back pain Neuro: Denies weakness, numbness, tingling Skin: Denies rashes, bruising Psych: Denies depression, anxiety, hallucinations    Objective:    Blood pressure 122/80, pulse 68, temperature 98.2 F (36.8 C), temperature source Oral, weight 214 lb (97.1 kg), SpO2 98 %.  Gen. Pleasant, well-nourished, in no distress, normal affect  HEENT: [place]/AT, face symmetric, no scleral icterus, PERRLA, nares patent without drainage Lungs: no accessory muscle use, CTAB, no wheezes or rales Cardiovascular: RRR, no m/r/g, no peripheral edema Abdomen: BS present, soft, TTP in RLQ, suprapubic area, LLQ.  CVA tenderness on L side.  ND, no hepatosplenomegaly. Neuro:  A&Ox3, CN II-XII intact, normal gait  Wt Readings from Last 3 Encounters:  01/05/18 214 lb (97.1 kg)  09/26/17 212 lb (96.2 kg)  09/06/17 211 lb 2.7 oz (95.8 kg)    Lab Results  Component Value Date   WBC 10.9 (H) 12/24/2016   HGB 13.7 12/24/2016   HCT 41.3 12/24/2016   PLT 387 12/24/2016   GLUCOSE 95 09/26/2017   CHOL 236 (H) 05/16/2017   TRIG 106.0 05/16/2017   HDL 80.90 05/16/2017   LDLCALC 134 (H) 05/16/2017  ALT 13 09/26/2017   AST 21 09/26/2017   NA 143 09/26/2017   K 4.1 09/26/2017   CL 104 09/26/2017   CREATININE 0.58 09/26/2017   BUN 16 09/26/2017   CO2 25 09/26/2017   TSH 2.18 06/15/2016   INR 0.97 10/15/2014    Assessment/Plan:  Left lower quadrant abdominal pain  -UA with 3+ blood, SG 1.30, +protein, pH 5.5 -concern for renal calculi given symptoms and UA. -will send for CT abd.and pelvis w/o contrast.  Appointment scheduled for 01/09/2018 at 10:45 AM [facility] -UCx sent -given urine strainer and encouraged to increase p.o. intake of fluids. -Declines pain medicine at this time -f/u prn  Grier Mitts, MD

## 2018-01-05 NOTE — Patient Instructions (Signed)
Abdominal Pain, Adult Abdominal pain can be caused by many things. Often, abdominal pain is not serious and it gets better with no treatment or by being treated at home. However, sometimes abdominal pain is serious. Your health care provider will do a medical history and a physical exam to try to determine the cause of your abdominal pain. Follow these instructions at home:  Take over-the-counter and prescription medicines only as told by your health care provider. Do not take a laxative unless told by your health care provider.  Drink enough fluid to keep your urine clear or pale yellow.  Watch your condition for any changes.  Keep all follow-up visits as told by your health care provider. This is important. Contact a health care provider if:  Your abdominal pain changes or gets worse.  You are not hungry or you lose weight without trying.  You are constipated or have diarrhea for more than 2-3 days.  You have pain when you urinate or have a bowel movement.  Your abdominal pain wakes you up at night.  Your pain gets worse with meals, after eating, or with certain foods.  You are throwing up and cannot keep anything down.  You have a fever. Get help right away if:  Your pain does not go away as soon as your health care provider told you to expect.  You cannot stop throwing up.  Your pain is only in areas of the abdomen, such as the right side or the left lower portion of the abdomen.  You have bloody or black stools, or stools that look like tar.  You have severe pain, cramping, or bloating in your abdomen.  You have signs of dehydration, such as: ? Dark urine, very little urine, or no urine. ? Cracked lips. ? Dry mouth. ? Sunken eyes. ? Sleepiness. ? Weakness. This information is not intended to replace advice given to you by your health care provider. Make sure you discuss any questions you have with your health care provider. Document Released: 11/18/2004 Document  Revised: 08/29/2015 Document Reviewed: 07/23/2015 Elsevier Interactive Patient Education  2018 [employer].  Kidney Stones Kidney stones (urolithiasis) are rock-like masses that form inside of the kidneys. Kidneys are organs that make pee (urine). A kidney stone can cause very bad pain and can block the flow of pee. The stone usually leaves your body (passes) through your pee. You may need to have a doctor take out the stone. Follow these instructions at home: Eating and drinking  Drink enough fluid to keep your pee clear or pale yellow. This will help you pass the stone.  If told by your doctor, change the foods you eat (your diet). This may include: ? Limiting how much salt (sodium) you eat. ? Eating more fruits and vegetables. ? Limiting how much meat, poultry, fish, and eggs you eat.  Follow instructions from your doctor about eating or drinking restrictions. General instructions  Collect pee samples as told by your doctor. You may need to collect a pee sample: ? 24 hours after a stone comes out. ? 8-12 weeks after a stone comes out, and every 6-12 months after that.  Strain your pee every time you pee (urinate), for as long as told. Use the strainer that your doctor recommends.  Do not throw out the stone. Keep it so that it can be tested by your doctor.  Take over-the-counter and prescription medicines only as told by your doctor.  Keep all follow-up visits as told by  your doctor. This is important. You may need follow-up tests. Preventing kidney stones To prevent another kidney stone:  Drink enough fluid to keep your pee clear or pale yellow. This is the best way to prevent kidney stones.  Eat healthy foods.  Avoid certain foods as told by your doctor. You may be told to eat less protein.  Stay at a healthy weight.  Contact a doctor if:  You have pain that gets worse or does not get better with medicine. Get help right away if:  You have a fever or chills.  You  get very bad pain.  You get new pain in your belly (abdomen).  You pass out (faint).  You cannot pee. This information is not intended to replace advice given to you by your health care provider. Make sure you discuss any questions you have with your health care provider. Document Released: 07/28/2007 Document Revised: 10/28/2015 Document Reviewed: 10/28/2015 Elsevier Interactive Patient Education  2017 [employer].  Constipation, Adult Constipation is when a person has fewer bowel movements in a week than normal, has difficulty having a bowel movement, or has stools that are dry, hard, or larger than normal. Constipation may be caused by an underlying condition. It may become worse with age if a person takes certain medicines and does not take in enough fluids. Follow these instructions at home: Eating and drinking   Eat foods that have a lot of fiber, such as fresh fruits and vegetables, whole grains, and beans.  Limit foods that are high in fat, low in fiber, or overly processed, such as french fries, hamburgers, cookies, candies, and soda.  Drink enough fluid to keep your urine clear or pale yellow. General instructions  Exercise regularly or as told by your health care provider.  Go to the restroom when you have the urge to go. Do not hold it in.  Take over-the-counter and prescription medicines only as told by your health care provider. These include any fiber supplements.  Practice pelvic floor retraining exercises, such as deep breathing while relaxing the lower abdomen and pelvic floor relaxation during bowel movements.  Watch your condition for any changes.  Keep all follow-up visits as told by your health care provider. This is important. Contact a health care provider if:  You have pain that gets worse.  You have a fever.  You do not have a bowel movement after 4 days.  You vomit.  You are not hungry.  You lose weight.  You are bleeding from the  anus.  You have thin, pencil-like stools. Get help right away if:  You have a fever and your symptoms suddenly get worse.  You leak stool or have blood in your stool.  Your abdomen is bloated.  You have severe pain in your abdomen.  You feel dizzy or you faint. This information is not intended to replace advice given to you by your health care provider. Make sure you discuss any questions you have with your health care provider. Document Released: 11/07/2003 Document Revised: 08/29/2015 Document Reviewed: 07/30/2015 Elsevier Interactive Patient Education  2018 [employer].

## 2018-01-06 LAB — URINE CULTURE
MICRO NUMBER:: 91373163
SPECIMEN QUALITY:: ADEQUATE

## 2018-01-09 ENCOUNTER — Encounter: Payer: Self-pay | Admitting: Family Medicine

## 2018-01-09 ENCOUNTER — Ambulatory Visit (INDEPENDENT_AMBULATORY_CARE_PROVIDER_SITE_OTHER)
Admission: RE | Admit: 2018-01-09 | Discharge: 2018-01-09 | Disposition: A | Discharge status: Home / Self Care | Payer: Medicare Other | Source: Ambulatory Visit | Attending: Family Medicine | Admitting: Family Medicine

## 2018-01-09 DIAGNOSIS — R1032 Left lower quadrant pain: Secondary | ICD-10-CM | POA: Diagnosis not present

## 2018-01-09 DIAGNOSIS — N200 Calculus of kidney: Secondary | ICD-10-CM | POA: Diagnosis not present

## 2018-01-26 ENCOUNTER — Encounter: Payer: Self-pay | Admitting: Family Medicine

## 2018-01-30 ENCOUNTER — Encounter: Payer: Self-pay | Admitting: Family Medicine

## 2018-01-30 DIAGNOSIS — M25512 Pain in left shoulder: Secondary | ICD-10-CM | POA: Diagnosis not present

## 2018-01-30 DIAGNOSIS — M0589 Other rheumatoid arthritis with rheumatoid factor of multiple sites: Secondary | ICD-10-CM | POA: Diagnosis not present

## 2018-01-30 DIAGNOSIS — M150 Primary generalized (osteo)arthritis: Secondary | ICD-10-CM | POA: Diagnosis not present

## 2018-01-30 DIAGNOSIS — M79672 Pain in left foot: Secondary | ICD-10-CM | POA: Diagnosis not present

## 2018-01-30 DIAGNOSIS — M797 Fibromyalgia: Secondary | ICD-10-CM | POA: Diagnosis not present

## 2018-01-30 LAB — HEPATIC FUNCTION PANEL
ALT: 13 (ref 7–35)
AST: 18 (ref 13–35)
Alkaline Phosphatase: 73 (ref 25–125)
Bilirubin, Total: 0.3

## 2018-01-30 LAB — BASIC METABOLIC PANEL
BUN: 18 (ref 4–21)
Creatinine: 0.6 (ref <=1.1)
Glucose: 92
Potassium: 3.9 (ref 3.4–5.3)
Sodium: 142 (ref 137–147)

## 2018-01-30 LAB — CBC AND DIFFERENTIAL
HCT: 38 (ref 36–46)
Hemoglobin: 12.4 (ref 12.0–16.0)
Platelets: 336 (ref 150–399)
WBC: 5.7

## 2018-02-01 ENCOUNTER — Encounter: Payer: Self-pay | Admitting: Family Medicine

## 2018-02-01 ENCOUNTER — Ambulatory Visit (INDEPENDENT_AMBULATORY_CARE_PROVIDER_SITE_OTHER): Payer: Medicare Other | Admitting: Family Medicine

## 2018-02-01 ENCOUNTER — Other Ambulatory Visit: Payer: Self-pay

## 2018-02-01 VITALS — BP 122/76 | HR 59 | Temp 98.1°F | Ht 64.0 in | Wt 212.0 lb

## 2018-02-01 DIAGNOSIS — R3129 Other microscopic hematuria: Secondary | ICD-10-CM | POA: Diagnosis not present

## 2018-02-01 DIAGNOSIS — R93429 Abnormal radiologic findings on diagnostic imaging of unspecified kidney: Secondary | ICD-10-CM

## 2018-02-01 DIAGNOSIS — N2889 Other specified disorders of kidney and ureter: Secondary | ICD-10-CM | POA: Diagnosis not present

## 2018-02-01 NOTE — Progress Notes (Signed)
Subjective:     Patient ID: [name], female   DOB: 1956/02/05, 62 y.o.   MRN: [id number]  HPI Patient is seen for follow-up to discuss some recent abnormalities noted on CT scan.  She had presented here with some flank pain and was sent for CT abdomen pelvis to rule out stone.  She had some blood in her urine but has chronic hematuria.  She had hematuria work-up with urology September 2016 which was unrevealing.  No recent gross hematuria.  CT scan showed tiny nodules right middle lobe and right lung base which were stable.  She had evidence for left renal lesions which were characterized as hyperdense but could not be confirmed as hyperdense or simple cyst.  There was recommendation to consider dedicated contrast-enhanced renal MRI for further evaluation.  Her flank pain is fully resolved at this time.  Recent CT showed no evidence for kidney stones.  Urine culture negative  There was also comment of some circumferential thickening distal esophagus and proximal stomach possibly related to underdistention.  She has had previous gastric sleeve procedure.  Appetite and weight are stable.  She denies any dysphagia.  No odynophagia.  No abdominal pain at this time.  Past Medical History:  Diagnosis Date  . ANGIOMA 10/03/2008   REMOVED FROM RIGHT LOWER LEG-BENIGN  . Anxiety   . Arthritis    RA  . Bronchitis    hx of   . Cancer (HCC)    BASAL CELL SKIN CANCER  . Complication of anesthesia   . Fibromyalgia   . GERD 08/27/2008  . History of blood transfusion    1995  . History of hiatal hernia    hx of has had surgically repaired  . History of measles   . History of mumps   . History of nonmelanoma skin cancer   . HYPERLIPIDEMIA 08/27/2008  . HYPERTENSION 08/27/2008   pt is currently on no meds   . Pneumonia    hx of   . PONV (postoperative nausea and vomiting)   . Rheumatoid arthritis ([place])   . SEBORRHEIC KERATOSIS 08/27/2008   Past Surgical History:  Procedure Laterality Date  .  ABDOMINAL HYSTERECTOMY  1995  . APPENDECTOMY    . BREAST BIOPSY Left   . BREAST DUCTAL SYSTEM EXCISION  08/16/2011   Procedure: EXCISION DUCTAL SYSTEM BREAST;  Surgeon: Joyice Faster. Cornett, MD;  Location: [facility];  Service: General;  Laterality: Left;  left breast duct excision  . [place]   times 2; 1980 and 1982  . [place]  . CHOLECYSTECTOMY  1997  . EXCISION/RELEASE BURSA HIP Right 10/21/2014   Procedure: OPEN RIGHT HIP BURSECTOMY, EXOSECTOMY;  Surgeon: Paralee Cancel, MD;  Location: [facility];  Service: Orthopedics;  Laterality: Right;  . foot surgery  2004, 2005, 2008   bilat secondary to neuropathy   . JOINT REPLACEMENT  10,12   lt total knee X2  . KNEE ARTHROSCOPY  2007, 2008, 2009  . LAPAROSCOPIC GASTRIC SLEEVE RESECTION N/A 10/30/2013   Procedure: LAPAROSCOPIC GASTRIC SLEEVE RESECTION WITH HIATAL HERNIA REPAIR AND UPPER ENDOSCOPY;  Surgeon: Greer Pickerel, MD;  Location: [facility];  Service: General;  Laterality: N/A;  . NASAL SINUS SURGERY  2013  . OPEN SURGICAL REPAIR OF GLUTEAL TENDON  01/17/2012   Procedure: OPEN SURGICAL REPAIR OF GLUTEAL TENDON;  Surgeon: Mauri Pole, MD;  Location: [facility];  Service: Orthopedics;  Laterality: Right;    reports that she  quit smoking about 33 years ago. Her smoking use included cigarettes. She has a 1.00 pack-year smoking history. She has never used smokeless tobacco. She reports that she does not drink alcohol or use drugs. family history includes Breast cancer (age of onset: 58) in her mother; Cancer in her mother; Diabetes in her son; Epilepsy in her father; Heart attack in her sister and sister; Kidney disease in her father. Allergies  Allergen Reactions  . Bee Venom Anaphylaxis  . Codeine Sulfate Other (See Comments)    GI upset  . Penicillins Rash     Review of Systems  Constitutional: Negative for chills, fever and unexpected weight change.  Respiratory: Negative for shortness of breath.    Cardiovascular: Negative for chest pain.  Gastrointestinal: Negative for abdominal pain.  Genitourinary: Negative for dysuria, flank pain and hematuria.       Objective:   Physical Exam  Constitutional: She appears well-developed and well-nourished.  Cardiovascular: Normal rate and regular rhythm.  Pulmonary/Chest: Effort normal and breath sounds normal.       Assessment:     #1 recent flank pain resolved  #2 chronic hematuria with full urological work-up unrevealing 2016  #3 recent abnormal CT abdomen pelvis with comment of left renal lesions which could not be confirmed as simple cysts    Plan:     -Set up dedicated contrast-enhanced renal MRI for further evaluation -Follow-up promptly for any gross hematuria or other concerns - discussed possible GI referral regarding other abnormality of distal esophagus but she has no dysphagia or other concerns and declines at this time.  Eulas Post MD [facility]

## 2018-02-19 ENCOUNTER — Ambulatory Visit
Admission: RE | Admit: 2018-02-19 | Discharge: 2018-02-19 | Disposition: A | Discharge status: Home / Self Care | Payer: Medicare Other | Source: Ambulatory Visit | Attending: Family Medicine | Admitting: Family Medicine

## 2018-02-19 DIAGNOSIS — N2889 Other specified disorders of kidney and ureter: Secondary | ICD-10-CM

## 2018-02-19 DIAGNOSIS — N281 Cyst of kidney, acquired: Secondary | ICD-10-CM | POA: Diagnosis not present

## 2018-02-19 MED ORDER — GADOBENATE DIMEGLUMINE 529 MG/ML IV SOLN
20.0000 mL | Freq: Once | INTRAVENOUS | Status: AC | PRN
  Administered 2018-02-19: 20 mL via INTRAVENOUS

## 2018-02-22 HISTORY — PX: ROTATOR CUFF REPAIR: SHX139

## 2018-02-24 ENCOUNTER — Encounter: Payer: Self-pay | Admitting: Family Medicine

## 2018-02-26 ENCOUNTER — Other Ambulatory Visit: Payer: Self-pay | Admitting: Family Medicine

## 2018-02-28 ENCOUNTER — Encounter (INDEPENDENT_AMBULATORY_CARE_PROVIDER_SITE_OTHER): Payer: Self-pay | Admitting: Physical Medicine and Rehabilitation

## 2018-02-28 ENCOUNTER — Other Ambulatory Visit: Payer: Self-pay | Admitting: Family Medicine

## 2018-03-01 MED ORDER — SERTRALINE HCL 50 MG PO TABS: ORAL_TABLET | ORAL | 0 refills | Status: DC

## 2018-03-02 ENCOUNTER — Encounter (INDEPENDENT_AMBULATORY_CARE_PROVIDER_SITE_OTHER): Payer: Self-pay | Admitting: Physical Medicine and Rehabilitation

## 2018-03-03 ENCOUNTER — Telehealth (INDEPENDENT_AMBULATORY_CARE_PROVIDER_SITE_OTHER): Payer: Self-pay | Admitting: Physical Medicine and Rehabilitation

## 2018-03-10 DIAGNOSIS — M25512 Pain in left shoulder: Secondary | ICD-10-CM | POA: Diagnosis not present

## 2018-03-10 DIAGNOSIS — M25561 Pain in right knee: Secondary | ICD-10-CM | POA: Diagnosis not present

## 2018-03-13 ENCOUNTER — Other Ambulatory Visit (INDEPENDENT_AMBULATORY_CARE_PROVIDER_SITE_OTHER): Payer: Self-pay | Admitting: Physical Medicine and Rehabilitation

## 2018-03-13 DIAGNOSIS — F411 Generalized anxiety disorder: Secondary | ICD-10-CM

## 2018-03-13 MED ORDER — DIAZEPAM 5 MG PO TABS: ORAL_TABLET | ORAL | 0 refills | Status: DC

## 2018-03-13 NOTE — Progress Notes (Signed)
Pre-procedure diazepam ordered for pre-operative anxiety.  

## 2018-03-13 NOTE — Telephone Encounter (Signed)
Done

## 2018-03-20 ENCOUNTER — Ambulatory Visit (INDEPENDENT_AMBULATORY_CARE_PROVIDER_SITE_OTHER): Payer: Self-pay

## 2018-03-20 ENCOUNTER — Encounter (INDEPENDENT_AMBULATORY_CARE_PROVIDER_SITE_OTHER): Payer: Self-pay | Admitting: Physical Medicine and Rehabilitation

## 2018-03-20 ENCOUNTER — Ambulatory Visit (INDEPENDENT_AMBULATORY_CARE_PROVIDER_SITE_OTHER): Payer: Medicare Other | Admitting: Physical Medicine and Rehabilitation

## 2018-03-20 VITALS — BP 158/90 | HR 72 | Temp 98.0°F

## 2018-03-20 DIAGNOSIS — M79601 Pain in right arm: Secondary | ICD-10-CM | POA: Diagnosis not present

## 2018-03-20 DIAGNOSIS — M25511 Pain in right shoulder: Secondary | ICD-10-CM

## 2018-03-20 DIAGNOSIS — M542 Cervicalgia: Secondary | ICD-10-CM | POA: Diagnosis not present

## 2018-03-20 DIAGNOSIS — M47812 Spondylosis without myelopathy or radiculopathy, cervical region: Secondary | ICD-10-CM

## 2018-03-20 MED ORDER — METHYLPREDNISOLONE ACETATE 80 MG/ML IJ SUSP: 80.0000 mg | Freq: Once | INTRAMUSCULAR | Status: DC

## 2018-03-20 MED ORDER — BUPIVACAINE HCL 0.25 % IJ SOLN: 2.0000 mL | Freq: Once | INTRAMUSCULAR | Status: DC

## 2018-03-20 NOTE — Progress Notes (Signed)
Pt states pain in neck mostly on the right side that sometimes radiates into the right shoulder and right arm. Pt states pain started back a few weeks ago. Pt states last injection helped out a lot. Pt states looking down and sitting makes pain worse, ice packs helps with pain.   .Numeric Pain Rating Scale and Functional Assessment Average Pain 6   In the last MONTH (on 0-10 scale) has pain interfered with the following?  1. General activity like being  able to carry out your everyday physical activities such as walking, climbing stairs, carrying groceries, or moving a chair?  Rating(5)   +Driver, -BT, -Dye Allergies.

## 2018-03-20 NOTE — Patient Instructions (Signed)

## 2018-04-19 ENCOUNTER — Other Ambulatory Visit: Payer: Self-pay | Admitting: Family Medicine

## 2018-04-19 DIAGNOSIS — Z1231 Encounter for screening mammogram for malignant neoplasm of breast: Secondary | ICD-10-CM

## 2018-05-01 DIAGNOSIS — M0589 Other rheumatoid arthritis with rheumatoid factor of multiple sites: Secondary | ICD-10-CM | POA: Diagnosis not present

## 2018-05-01 DIAGNOSIS — M150 Primary generalized (osteo)arthritis: Secondary | ICD-10-CM | POA: Diagnosis not present

## 2018-05-01 DIAGNOSIS — M79672 Pain in left foot: Secondary | ICD-10-CM | POA: Diagnosis not present

## 2018-05-01 DIAGNOSIS — M25512 Pain in left shoulder: Secondary | ICD-10-CM | POA: Diagnosis not present

## 2018-05-01 DIAGNOSIS — M797 Fibromyalgia: Secondary | ICD-10-CM | POA: Diagnosis not present

## 2018-05-08 ENCOUNTER — Other Ambulatory Visit: Payer: Self-pay

## 2018-05-08 ENCOUNTER — Ambulatory Visit: Payer: Medicare Other | Admitting: Podiatry

## 2018-05-08 ENCOUNTER — Ambulatory Visit (INDEPENDENT_AMBULATORY_CARE_PROVIDER_SITE_OTHER): Payer: Medicare Other

## 2018-05-08 DIAGNOSIS — M7742 Metatarsalgia, left foot: Secondary | ICD-10-CM | POA: Diagnosis not present

## 2018-05-08 DIAGNOSIS — M2580 Other specified joint disorders, unspecified joint: Secondary | ICD-10-CM

## 2018-05-08 DIAGNOSIS — M76822 Posterior tibial tendinitis, left leg: Secondary | ICD-10-CM

## 2018-05-08 DIAGNOSIS — M779 Enthesopathy, unspecified: Secondary | ICD-10-CM | POA: Diagnosis not present

## 2018-05-08 DIAGNOSIS — M76829 Posterior tibial tendinitis, unspecified leg: Secondary | ICD-10-CM | POA: Diagnosis not present

## 2018-05-08 DIAGNOSIS — M79672 Pain in left foot: Secondary | ICD-10-CM

## 2018-05-08 MED ORDER — MELOXICAM 7.5 MG PO TABS: 7.5000 mg | ORAL_TABLET | Freq: Every day | ORAL | 0 refills | Status: DC

## 2018-05-09 ENCOUNTER — Other Ambulatory Visit: Payer: Self-pay | Admitting: Podiatry

## 2018-05-09 DIAGNOSIS — M76829 Posterior tibial tendinitis, unspecified leg: Secondary | ICD-10-CM

## 2018-05-09 NOTE — Progress Notes (Signed)
Subjective: 63 year old female presents the office today for concerns of left ankle pain as well as pain to the ball of her left foot.  She states this is been ongoing now for some time and is getting worse.  She recently is also purchase new shoes and she is Artie been able to tell that her shoes are starting to wear out.  She denies recent injury or trauma.  Her right side is doing well.  She has no other concerns today. Denies any systemic complaints such as fevers, chills, nausea, vomiting. No acute changes since last appointment, and no other complaints at this time.   Objective: AAO x3, NAD DP/PT pulses palpable bilaterally, CRT less than 3 seconds Upon weightbearing there is a decrease in medial arch height on the right side worse than left.  There is no pain on the right side.  On the left side there is tenderness on the posterior tibial tendon posterior and inferior to the medial malleolus.  There is minimal edema there is no erythema or increase in warmth.  There is tenderness submetatarsal 1 and 5 on the left foot.  There is prominence of the metatarsal heads.  No skin breakdown. No open lesions or pre-ulcerative lesions.  No pain with calf compression, swelling, warmth, erythema  Assessment: Left ankle posterior tibial tendinitis, sesamoiditis/metatarsalgia  Plan: -All treatment options discussed with the patient including all alternatives, risks, complications.  -X-rays were obtained reviewed.  There is no evidence of acute fracture or stress fracture identified today.  Status post metatarsal head excision.  There is some hypertrophic bone formation present of the fifth metatarsal. -Given ankle pain and I dispensed a Tri-Lock ankle brace. -Prescribed mobic. Discussed side effects of the medication and directed to stop if any are to occur and call the office.  -I do think she needs custom orthotics.  We will have her follow-up with Liliane Channel or Benjie Karvonen for this at her convenience.  Trula Slade DPM  -Patient encouraged to call the office with any questions, concerns, change in symptoms.

## 2018-05-18 ENCOUNTER — Ambulatory Visit: Payer: Medicare Other

## 2018-05-18 NOTE — Procedures (Signed)
Diagnostic Cervical Facet Joint Nerve Block with Fluoroscopic Guidance  Patient: [name]      Date of Birth: January 10, 1956 MRN: [id number] PCP: Eulas Post, MD      Visit Date: 03/20/2018   Universal Protocol:    Date/Time: 05/17/2008:40 AM  Consent Given By: the patient  Position: PRONE  Additional Comments: Vital signs were monitored before and after the procedure. Patient was prepped and draped in the usual sterile fashion. The correct patient, procedure, and site was verified.   Injection Procedure Details:  Procedure Site One Meds Administered:  Meds ordered this encounter  Medications  . methylPREDNISolone acetate (DEPO-MEDROL) injection 80 mg  . bupivacaine (MARCAINE) 0.25 % (with pres) injection 2 mL     Laterality: Bilateral  Location/Site:  C2-3 C3-4  Needle size: 25 G  Needle type: Spinal  Needle Placement: Articular Pillar  Findings:  -Contrast Used: 0.5 mL iohexol 180 mg iodine/mL   -Comments: Excellent flow of contrast over the articular pillars without intravascular flow  Procedure Details: The fluoroscope beam was positioned to square off the endplates of the desired vertebral level to achieve a true AP position. The beam was then moved in a small "counter" oblique to the contralateral side with a small amount of caudal tilt to achieve a trajectory alignment with the desired nerves.  For each target described below the skin was anesthetized with 1 ml of 1% Lidocaine without epinephrine.   To block the facet joint nerve to C2, the needle was fluoroscopically positioned over the inferior lateral portion of the C2/3 facet joint nerve where the third occipital nerve (TON) lies.  To block the facet joint nerves from C3 through C7, the lateral masses of these respective levels were localized under fluoroscopic visualization.  A spinal needle was inserted down to the "waist" at the above mentioned cervical levels.  The  needle was then "walked  off" until it rested just lateral to the trough of the lateral mass of the medial branch nerve, which innervates the cervical facet joint.  To block the C8 facet joint nerve, the needle was fluoroscopically introduced onto the Tl transverse process at its most medial superior end.  After contact with periosteum and negative aspirate for blood and CSF, correct placement without intravascular or epidural spread was confirmed by Bi-planar images and  injecting 0.5 ml. of Omnipaque-240.  A spot radiograph was obtained of this image.  Next, a 0.5 ml. volume of 1% Lidocaine without Epinephrine was then injected.  Prior to the procedure, the patient was given a Pain Diary which was completed for baseline measurements.  After the procedure, the patient rated their pain every 30 minutes and will continue rating at this frequency for a total of 5 hours.  The patient has been asked to complete the Diary and return to [place] by mail, fax or hand delivered as soon as possible.   Additional Comments:  The patient tolerated the procedure well Dressing: Band-Aid    Post-procedure details: Patient was observed during the procedure. Post-procedure instructions were reviewed.  Patient left the clinic in stable condition.

## 2018-05-18 NOTE — Progress Notes (Signed)
[name] - 63 y.o. female MRN [id number]  Date of birth: 27-Oct-1955  Office Visit Note: Visit Date: 03/20/2018 PCP: Eulas Post, MD Referred by: Eulas Post, MD  Subjective: Chief Complaint  Patient presents with  . Neck - Pain  . Right Shoulder - Pain  . Right Arm - Pain   HPI:  [name] is a 63 y.o. female who comes in today For planned second cervical facet block at C2-3 and C3-4.  Prior injection gave her quite a bit of relief for a month or 2.  We do have pain diary in the chart.  We did use a little bit of Depo-Medrol with the block.  We will repeat this today as a double block paradigm and potentially look at radiofrequency ablation.  She continues to see Dr. Jaynee Eagles for neurologic follow-up.  ROS Otherwise per HPI.  Assessment & Plan: Visit Diagnoses:  1. Cervical spondylosis without myelopathy   2. Cervicalgia     Plan: No additional findings.   Meds & Orders:  Meds ordered this encounter  Medications  . methylPREDNISolone acetate (DEPO-MEDROL) injection 80 mg  . bupivacaine (MARCAINE) 0.25 % (with pres) injection 2 mL    Orders Placed This Encounter  Procedures  . Facet Injection  . XR C-ARM NO REPORT    Follow-up: Return if symptoms worsen or fail to improve.   Procedures: No procedures performed  Diagnostic Cervical Facet Joint Nerve Block with Fluoroscopic Guidance  Patient: [name]      Date of Birth: 1955/10/31 MRN: [id number] PCP: Eulas Post, MD      Visit Date: 03/20/2018   Universal Protocol:    Date/Time: 05/17/2008:40 AM  Consent Given By: the patient  Position: PRONE  Additional Comments: Vital signs were monitored before and after the procedure. Patient was prepped and draped in the usual sterile fashion. The correct patient, procedure, and site was verified.   Injection Procedure Details:  Procedure Site One Meds Administered:  Meds ordered this encounter  Medications  .  methylPREDNISolone acetate (DEPO-MEDROL) injection 80 mg  . bupivacaine (MARCAINE) 0.25 % (with pres) injection 2 mL     Laterality: Bilateral  Location/Site:  C2-3 C3-4  Needle size: 25 G  Needle type: Spinal  Needle Placement: Articular Pillar  Findings:  -Contrast Used: 0.5 mL iohexol 180 mg iodine/mL   -Comments: Excellent flow of contrast over the articular pillars without intravascular flow  Procedure Details: The fluoroscope beam was positioned to square off the endplates of the desired vertebral level to achieve a true AP position. The beam was then moved in a small "counter" oblique to the contralateral side with a small amount of caudal tilt to achieve a trajectory alignment with the desired nerves.  For each target described below the skin was anesthetized with 1 ml of 1% Lidocaine without epinephrine.   To block the facet joint nerve to C2, the needle was fluoroscopically positioned over the inferior lateral portion of the C2/3 facet joint nerve where the third occipital nerve (TON) lies.  To block the facet joint nerves from C3 through C7, the lateral masses of these respective levels were localized under fluoroscopic visualization.  A spinal needle was inserted down to the "waist" at the above mentioned cervical levels.  The  needle was then "walked off" until it rested just lateral to the trough of the lateral mass of the medial branch nerve, which innervates the cervical facet joint.  To block the C8  facet joint nerve, the needle was fluoroscopically introduced onto the Tl transverse process at its most medial superior end.  After contact with periosteum and negative aspirate for blood and CSF, correct placement without intravascular or epidural spread was confirmed by Bi-planar images and  injecting 0.5 ml. of Omnipaque-240.  A spot radiograph was obtained of this image.  Next, a 0.5 ml. volume of 1% Lidocaine without Epinephrine was then injected.  Prior to the  procedure, the patient was given a Pain Diary which was completed for baseline measurements.  After the procedure, the patient rated their pain every 30 minutes and will continue rating at this frequency for a total of 5 hours.  The patient has been asked to complete the Diary and return to [place] by mail, fax or hand delivered as soon as possible.   Additional Comments:  The patient tolerated the procedure well Dressing: Band-Aid    Post-procedure details: Patient was observed during the procedure. Post-procedure instructions were reviewed.  Patient left the clinic in stable condition.      Clinical History: MRI CERVICAL SPINE WITHOUT CONTRAST  TECHNIQUE: Multiplanar, multisequence MR imaging of the cervical spine was performed. No intravenous contrast was administered.  COMPARISON:  Cervical spine radiographs 02/21/2015  FINDINGS: Vertebral alignment is unchanged and near anatomic. Vertebral body heights and intervertebral disc space heights are preserved. Vertebral bone marrow signal is unremarkable. Craniocervical junction is unremarkable. The cervical spinal cord is normal in caliber and signal. Paraspinal soft tissues are unremarkable.  C2-3:  Negative.  C3-4: At most minimal left facet arthrosis without disc herniation or stenosis.  C4-5: Minimal disc bulging, minimal right uncovertebral spurring, and minimal facet arthrosis result in minimal right neural foraminal narrowing without spinal stenosis.  C5-6:  Negative.  C6-7:  Negative.  C7-T1: Mild left greater than right facet arthrosis without disc herniation or stenosis.  IMPRESSION: Minimal cervical spondylosis and facet arthrosis without evidence of neural impingement.   Electronically Signed   By: Logan Bores M.D.   On: 04/18/2015 08:05     Objective:  VS:  HT:    WT:   BMI:     BP:(!) 158/90  HR:72bpm  TEMP:98 F (36.7 C)(Oral)  RESP:  Physical Exam  Ortho Exam Imaging: No  results found.

## 2018-05-19 ENCOUNTER — Other Ambulatory Visit: Payer: Self-pay | Admitting: Family Medicine

## 2018-05-19 MED ORDER — SERTRALINE HCL 50 MG PO TABS: ORAL_TABLET | ORAL | 0 refills | Status: DC

## 2018-05-19 MED ORDER — ESTRADIOL 1 MG PO TABS: 1.0000 mg | ORAL_TABLET | Freq: Every day | ORAL | 0 refills | Status: DC

## 2018-05-20 ENCOUNTER — Encounter: Payer: Self-pay | Admitting: Family Medicine

## 2018-05-22 MED ORDER — ZOLPIDEM TARTRATE 10 MG PO TABS: 10.0000 mg | ORAL_TABLET | Freq: Every evening | ORAL | 1 refills | Status: DC | PRN

## 2018-05-22 NOTE — Telephone Encounter (Signed)
Refilled Ambien for 6 months.

## 2018-05-23 ENCOUNTER — Other Ambulatory Visit: Payer: Medicare Other | Admitting: Orthotics

## 2018-05-30 ENCOUNTER — Other Ambulatory Visit: Payer: Medicare Other | Admitting: Orthotics

## 2018-05-30 ENCOUNTER — Ambulatory Visit: Payer: Medicare Other | Admitting: Podiatry

## 2018-06-14 ENCOUNTER — Telehealth (INDEPENDENT_AMBULATORY_CARE_PROVIDER_SITE_OTHER): Payer: Self-pay | Admitting: Physical Medicine and Rehabilitation

## 2018-06-14 ENCOUNTER — Encounter (INDEPENDENT_AMBULATORY_CARE_PROVIDER_SITE_OTHER): Payer: Self-pay | Admitting: Physical Medicine and Rehabilitation

## 2018-06-14 NOTE — Telephone Encounter (Signed)
Notification or Prior Authorization is not required for the requested services  This [employer] members plan does not currently require a prior authorization for [phone] and [phone].  Decision ID #:[id number]

## 2018-06-16 ENCOUNTER — Encounter: Payer: Self-pay | Admitting: Podiatry

## 2018-06-21 NOTE — Telephone Encounter (Signed)
Left message for patient to call back to schedule.  °

## 2018-06-23 NOTE — Telephone Encounter (Signed)
Left message #2 for patient to call to schedule.

## 2018-06-29 ENCOUNTER — Telehealth: Payer: Self-pay | Admitting: Physical Medicine and Rehabilitation

## 2018-06-29 MED ORDER — DIAZEPAM 5 MG PO TABS: ORAL_TABLET | ORAL | 0 refills | Status: DC

## 2018-06-29 NOTE — Telephone Encounter (Signed)
Scheduled for 6/3 and 6/10 at 1530 with driver.

## 2018-06-29 NOTE — Telephone Encounter (Signed)
Pre-procedure diazepam ordered for pre-operative anxiety.  

## 2018-07-04 ENCOUNTER — Other Ambulatory Visit: Payer: Self-pay | Admitting: Physical Medicine and Rehabilitation

## 2018-07-04 ENCOUNTER — Encounter: Payer: Self-pay | Admitting: Physical Medicine and Rehabilitation

## 2018-07-04 MED ORDER — TRIAZOLAM 0.25 MG PO TABS: ORAL_TABLET | ORAL | 0 refills | Status: DC

## 2018-07-18 DIAGNOSIS — K5904 Chronic idiopathic constipation: Secondary | ICD-10-CM | POA: Diagnosis not present

## 2018-07-18 DIAGNOSIS — R140 Abdominal distension (gaseous): Secondary | ICD-10-CM | POA: Diagnosis not present

## 2018-07-18 DIAGNOSIS — Z1211 Encounter for screening for malignant neoplasm of colon: Secondary | ICD-10-CM | POA: Diagnosis not present

## 2018-07-26 ENCOUNTER — Other Ambulatory Visit: Payer: Self-pay

## 2018-07-26 ENCOUNTER — Ambulatory Visit: Payer: Self-pay

## 2018-07-26 ENCOUNTER — Ambulatory Visit: Payer: Medicare Other | Admitting: Physical Medicine and Rehabilitation

## 2018-07-26 ENCOUNTER — Encounter: Payer: Self-pay | Admitting: Physical Medicine and Rehabilitation

## 2018-07-26 VITALS — BP 152/78 | HR 73

## 2018-07-26 DIAGNOSIS — M47812 Spondylosis without myelopathy or radiculopathy, cervical region: Secondary | ICD-10-CM | POA: Diagnosis not present

## 2018-07-26 DIAGNOSIS — M542 Cervicalgia: Secondary | ICD-10-CM

## 2018-07-26 MED ORDER — METHYLPREDNISOLONE ACETATE 80 MG/ML IJ SUSP
80.0000 mg | Freq: Once | INTRAMUSCULAR | Status: AC
Start: 2018-07-26 — End: 2018-07-26
  Administered 2018-07-26: 80 mg

## 2018-07-26 NOTE — Progress Notes (Signed)
  Numeric Pain Rating Scale and Functional Assessment Average Pain (5)   In the last MONTH (on 0-10 scale) has pain interfered with the following?  1. General activity like being  able to carry out your everyday physical activities such as walking, climbing stairs, carrying groceries, or moving a chair?  Rating(5)   +Driver, -BT, -Dye Allergies.  

## 2018-08-01 DIAGNOSIS — M0589 Other rheumatoid arthritis with rheumatoid factor of multiple sites: Secondary | ICD-10-CM | POA: Diagnosis not present

## 2018-08-01 DIAGNOSIS — M150 Primary generalized (osteo)arthritis: Secondary | ICD-10-CM | POA: Diagnosis not present

## 2018-08-01 DIAGNOSIS — M797 Fibromyalgia: Secondary | ICD-10-CM | POA: Diagnosis not present

## 2018-08-01 DIAGNOSIS — M25512 Pain in left shoulder: Secondary | ICD-10-CM | POA: Diagnosis not present

## 2018-08-01 DIAGNOSIS — M79672 Pain in left foot: Secondary | ICD-10-CM | POA: Diagnosis not present

## 2018-08-02 ENCOUNTER — Other Ambulatory Visit: Payer: Self-pay

## 2018-08-02 ENCOUNTER — Ambulatory Visit: Payer: Self-pay

## 2018-08-02 ENCOUNTER — Ambulatory Visit (INDEPENDENT_AMBULATORY_CARE_PROVIDER_SITE_OTHER): Payer: Medicare Other | Admitting: Physical Medicine and Rehabilitation

## 2018-08-02 ENCOUNTER — Encounter: Payer: Self-pay | Admitting: Physical Medicine and Rehabilitation

## 2018-08-02 VITALS — BP 156/82 | HR 70 | Temp 98.0°F

## 2018-08-02 DIAGNOSIS — M0589 Other rheumatoid arthritis with rheumatoid factor of multiple sites: Secondary | ICD-10-CM | POA: Diagnosis not present

## 2018-08-02 DIAGNOSIS — M542 Cervicalgia: Secondary | ICD-10-CM

## 2018-08-02 DIAGNOSIS — M47812 Spondylosis without myelopathy or radiculopathy, cervical region: Secondary | ICD-10-CM | POA: Diagnosis not present

## 2018-08-02 MED ORDER — METHYLPREDNISOLONE ACETATE 80 MG/ML IJ SUSP
80.0000 mg | Freq: Once | INTRAMUSCULAR | Status: AC
  Administered 2018-08-02: 80 mg

## 2018-08-02 NOTE — Progress Notes (Signed)
.  Numeric Pain Rating Scale and Functional Assessment Average Pain 3   In the last MONTH (on 0-10 scale) has pain interfered with the following?  1. General activity like being  able to carry out your everyday physical activities such as walking, climbing stairs, carrying groceries, or moving a chair?  Rating(4)   +Driver, -BT, -Dye Allergies.  

## 2018-08-04 DIAGNOSIS — D3612 Benign neoplasm of peripheral nerves and autonomic nervous system, upper limb, including shoulder: Secondary | ICD-10-CM | POA: Diagnosis not present

## 2018-08-04 DIAGNOSIS — D2372 Other benign neoplasm of skin of left lower limb, including hip: Secondary | ICD-10-CM | POA: Diagnosis not present

## 2018-08-04 DIAGNOSIS — L570 Actinic keratosis: Secondary | ICD-10-CM | POA: Diagnosis not present

## 2018-08-04 DIAGNOSIS — Z85828 Personal history of other malignant neoplasm of skin: Secondary | ICD-10-CM | POA: Diagnosis not present

## 2018-08-09 ENCOUNTER — Other Ambulatory Visit: Payer: Self-pay

## 2018-08-09 ENCOUNTER — Ambulatory Visit (INDEPENDENT_AMBULATORY_CARE_PROVIDER_SITE_OTHER): Payer: Medicare Other | Admitting: Family Medicine

## 2018-08-09 ENCOUNTER — Encounter: Payer: Self-pay | Admitting: Family Medicine

## 2018-08-09 VITALS — BP 148/80 | HR 71 | Temp 98.2°F | Ht 64.0 in | Wt 213.9 lb

## 2018-08-09 DIAGNOSIS — H9202 Otalgia, left ear: Secondary | ICD-10-CM

## 2018-08-09 DIAGNOSIS — I10 Essential (primary) hypertension: Secondary | ICD-10-CM

## 2018-08-09 MED ORDER — LISINOPRIL 10 MG PO TABS: 10.0000 mg | ORAL_TABLET | Freq: Every day | ORAL | 3 refills | Status: DC

## 2018-08-09 NOTE — Progress Notes (Signed)
Subjective:     Patient ID: [name], female   DOB: Jun 13, 1955, 63 y.o.   MRN: [id number]  HPI   Patient has had some left ear pain for about 2 weeks.  No known injury.  She has somewhat diffuse poorly localized pain which is somewhat posterior auricular and also preauricular.  No hearing changes.  No tinnitus.  No drainage.  Pain is worse at night.  She had remote history of tympanostomy tube right ear several years ago.  Patient has history of hypertension.  She had gastric bypass surgery several years ago and blood pressure improved.  Previously took lisinopril HCTZ.  She was at GI office last week and had blood pressure 379K systolic and has had several similar readings at home.  Past Medical History:  Diagnosis Date  . ANGIOMA 10/03/2008   REMOVED FROM RIGHT LOWER LEG-BENIGN  . Anxiety   . Arthritis    RA  . Bronchitis    hx of   . Cancer (HCC)    BASAL CELL SKIN CANCER  . Complication of anesthesia   . Fibromyalgia   . GERD 08/27/2008  . History of blood transfusion    1995  . History of hiatal hernia    hx of has had surgically repaired  . History of measles   . History of mumps   . History of nonmelanoma skin cancer   . HYPERLIPIDEMIA 08/27/2008  . HYPERTENSION 08/27/2008   pt is currently on no meds   . Pneumonia    hx of   . PONV (postoperative nausea and vomiting)   . Rheumatoid arthritis ([place])   . SEBORRHEIC KERATOSIS 08/27/2008   Past Surgical History:  Procedure Laterality Date  . ABDOMINAL HYSTERECTOMY  1995  . APPENDECTOMY    . BREAST BIOPSY Left   . BREAST DUCTAL SYSTEM EXCISION  08/16/2011   Procedure: EXCISION DUCTAL SYSTEM BREAST;  Surgeon: Joyice Faster. Cornett, MD;  Location: [place];  Service: General;  Laterality: Left;  left breast duct excision  . [place]   times 2; 1980 and 1982  . [place]  . CHOLECYSTECTOMY  1997  . EXCISION/RELEASE BURSA HIP Right 10/21/2014   Procedure: OPEN RIGHT HIP  BURSECTOMY, EXOSECTOMY;  Surgeon: Paralee Cancel, MD;  Location: [facility];  Service: Orthopedics;  Laterality: Right;  . foot surgery  2004, 2005, 2008   bilat secondary to neuropathy   . JOINT REPLACEMENT  10,12   lt total knee X2  . KNEE ARTHROSCOPY  2007, 2008, 2009  . LAPAROSCOPIC GASTRIC SLEEVE RESECTION N/A 10/30/2013   Procedure: LAPAROSCOPIC GASTRIC SLEEVE RESECTION WITH HIATAL HERNIA REPAIR AND UPPER ENDOSCOPY;  Surgeon: Greer Pickerel, MD;  Location: [facility];  Service: General;  Laterality: N/A;  . NASAL SINUS SURGERY  2013  . OPEN SURGICAL REPAIR OF GLUTEAL TENDON  01/17/2012   Procedure: OPEN SURGICAL REPAIR OF GLUTEAL TENDON;  Surgeon: Mauri Pole, MD;  Location: [facility];  Service: Orthopedics;  Laterality: Right;    reports that she quit smoking about 34 years ago. Her smoking use included cigarettes. She has a 1.00 pack-year smoking history. She has never used smokeless tobacco. She reports that she does not drink alcohol or use drugs. family history includes Breast cancer (age of onset: 22) in her mother; Cancer in her mother; Diabetes in her son; Epilepsy in her father; Heart attack in her sister and sister; Kidney disease in her father. Allergies  Allergen Reactions  .  Bee Venom Anaphylaxis  . Codeine Sulfate Other (See Comments)    GI upset  . Penicillins Rash     Review of Systems  Constitutional: Negative for fatigue.  Eyes: Negative for visual disturbance.  Respiratory: Negative for cough, chest tightness, shortness of breath and wheezing.   Cardiovascular: Negative for chest pain, palpitations and leg swelling.  Neurological: Negative for dizziness, seizures, syncope, weakness, light-headedness and headaches.       Objective:   Physical Exam Constitutional:      Appearance: She is well-developed.  HENT:     Right Ear: Tympanic membrane, ear canal and external ear normal. There is no impacted cerumen.     Left Ear: Tympanic membrane, ear canal and external ear  normal. There is no impacted cerumen.  Eyes:     Pupils: Pupils are equal, round, and reactive to light.  Neck:     Musculoskeletal: Neck supple.     Thyroid: No thyromegaly.     Vascular: No JVD.  Cardiovascular:     Rate and Rhythm: Normal rate and regular rhythm.     Heart sounds: No gallop.   Pulmonary:     Effort: Pulmonary effort is normal. No respiratory distress.     Breath sounds: Normal breath sounds. No wheezing or rales.  Neurological:     Mental Status: She is alert.        Assessment:     #1 left otalgia.  Etiology unclear.  Ear exam is normal.  She has had some recent increased allergy symptoms and may have some eustachian tube issues  #2 hypertension currently untreated and uncontrolled.  Past history of hypertension.    Plan:     -recommend she try over-the-counter Flonase and possible oral antihistamine such as Claritin or Allegra  -Start back lisinopril 10 mg once daily.  Monitor blood pressure closely be in touch if not consistently less than 140/90 over the next few weeks -We will touch base for follow-up in 3 to 4 weeks  Eulas Post MD [facility]

## 2018-08-09 NOTE — Patient Instructions (Signed)
Monitor blood pressure and be in touch if not consistently < 140 /90 after getting back on the Lisinopril  Consider getting back on Flonase and Claritan or Allegra.

## 2018-08-10 NOTE — Progress Notes (Signed)
[name] - 63 y.o. female MRN [id number]  Date of birth: 12/31/1955  Office Visit Note: Visit Date: 08/02/2018 PCP: Eulas Post, MD Referred by: Eulas Post, MD  Subjective: Chief Complaint  Patient presents with   Neck - Pain   HPI:  [name] is a 63 y.o. female who comes in today For planned left-sided T ablation of the C2-3 and C3-4 facet joints.  Please see our prior procedure note for details and justification but basically we completed right sided ablation and she is doing quite well.  She now can tell that the left side is really bothering her.  We will go ahead and complete the ablation today.  She has had double diagnostic blocks and chronic neck pain and does meet all the criteria and those notes can be reviewed.  ROS Otherwise per HPI.  Assessment & Plan: Visit Diagnoses:  1. Cervical spondylosis without myelopathy   2. Cervicalgia     Plan: No additional findings.   Meds & Orders:  Meds ordered this encounter  Medications   methylPREDNISolone acetate (DEPO-MEDROL) injection 80 mg    Orders Placed This Encounter  Procedures   Radiofrequency,Cervical   XR C-ARM NO REPORT    Follow-up: No follow-ups on file.   Procedures: No procedures performed  Cervical Facet Nerve Denervation  Patient: [name]      Date of Birth: 1956-02-11 MRN: [id number] PCP: Eulas Post, MD      Visit Date: 08/02/2018   Universal Protocol:    Date/Time: 06/18/206:10 AM  Consent Given By: the patient  Position: Side Lying  Additional Comments: Vital signs were monitored before and after the procedure. Patient was prepped and draped in the usual sterile fashion. The correct patient, procedure, and site was verified.   Injection Procedure Details:  Procedure Site One Meds Administered:  Meds ordered this encounter  Medications   methylPREDNISolone acetate (DEPO-MEDROL) injection 80 mg     Laterality: Left  Location/Site:    C2-3 C3-4  Needle size: 20 G  Needle type: RF cannula, 5 mm active tip  Findings:  -Comments:   Procedure Details: The fluoroscope beam was positioned to square off the endplates of the desired vertebral level to achieve a true LATERAL position with care to align facet joints. For each target described below the skin was anesthetized with 1 ml of 1% Lidocaine without epinephrine.  To denervate the facet joint nerve to C2 (third occipital nerve), the cannula was advanced under fluoroscopic guidance and positioned over the inferior lateral portion of the C2/3 facet joint nerve where the third occipital nerve lies.  A minimum of three lesions were made along the location of the nerve.  To denervate the facet joint nerves from C3 through C7, the lateral masses of these respective levels were localized under fluoroscopic visualization.  An outer 18 gauge, 58mm active tip cannula was inserted with an initial target area a few centimeters dorsal to the main target area which is the center point of the trapezoidal area of the articular pillar.  The cannula was then positioned using biplanar imaging to touchdown had an oblique angle into the center of this trapezoidal area.  To denervate the C8 facet joint nerve, the cannula was fluoroscopically introduced onto the Tl transverse process at its most medial superior end.  For all of these levels, AP and lateral images were used to confirm location.  The radiofrequency probe was inserted into the cannula and stimulation was  carried out at both sensory and motor levels to make sure there was expected stimulation without a radicular pattern. Subsequently, this was removed and then 0.5 to 1 ml. of 1% Lidocaine was injected. The radiofrequency probe was re-inserted and denervation of the facet nerves (medial branches of the dorsal rami innervating the facet joints) was carried out at 80-85 degrees Celsius for 60 seconds. Then the cannulas were repositioned to  get one additional lesion at each level (total of two) for better efficacy. The above procedure was repeated for each facet joint nerve mentioned above. Radiographs were obtained at each level (unless otherwise noted) to verify probe placement during the neurotomy.   Additional Comments:  The patient tolerated the procedure well Dressing: Band-Aid    Post-procedure details: Patient was observed during the procedure. Post-procedure instructions were reviewed. Patient left the clinic in stable condition.      Clinical History: MRI CERVICAL SPINE WITHOUT CONTRAST  TECHNIQUE: Multiplanar, multisequence MR imaging of the cervical spine was performed. No intravenous contrast was administered.  COMPARISON:  Cervical spine radiographs 02/21/2015  FINDINGS: Vertebral alignment is unchanged and near anatomic. Vertebral body heights and intervertebral disc space heights are preserved. Vertebral bone marrow signal is unremarkable. Craniocervical junction is unremarkable. The cervical spinal cord is normal in caliber and signal. Paraspinal soft tissues are unremarkable.  C2-3:  Negative.  C3-4: At most minimal left facet arthrosis without disc herniation or stenosis.  C4-5: Minimal disc bulging, minimal right uncovertebral spurring, and minimal facet arthrosis result in minimal right neural foraminal narrowing without spinal stenosis.  C5-6:  Negative.  C6-7:  Negative.  C7-T1: Mild left greater than right facet arthrosis without disc herniation or stenosis.  IMPRESSION: Minimal cervical spondylosis and facet arthrosis without evidence of neural impingement.   Electronically Signed   By: Logan Bores M.D.   On: 04/18/2015 08:05     Objective:  VS:  HT:     WT:    BMI:      BP:(!) 156/82   HR:70bpm   TEMP:98 F (36.7 C)( )   RESP:97 % Physical Exam  Ortho Exam Imaging: No results found.

## 2018-08-10 NOTE — Procedures (Signed)
Cervical Facet Nerve Denervation  Patient: [name]      Date of Birth: 1956-01-06 MRN: [id number] PCP: Eulas Post, MD      Visit Date: 07/26/2018   Universal Protocol:    Date/Time: 06/18/206:07 AM  Consent Given By: the patient  Position: Side Lying  Additional Comments: Vital signs were monitored before and after the procedure. Patient was prepped and draped in the usual sterile fashion. The correct patient, procedure, and site was verified.   Injection Procedure Details:  Procedure Site One Meds Administered:  Meds ordered this encounter  Medications  . methylPREDNISolone acetate (DEPO-MEDROL) injection 80 mg     Laterality: Right  Location/Site:  C2-3 C3-4  Needle size: 20 G  Needle type: RF cannula, 5 mm active tip  Findings:  -Comments:   Procedure Details: The fluoroscope beam was positioned to square off the endplates of the desired vertebral level to achieve a true LATERAL position with care to align facet joints. For each target described below the skin was anesthetized with 1 ml of 1% Lidocaine without epinephrine.  To denervate the facet joint nerve to C2 (third occipital nerve), the cannula was advanced under fluoroscopic guidance and positioned over the inferior lateral portion of the C2/3 facet joint nerve where the third occipital nerve lies.  A minimum of three lesions were made along the location of the nerve.  To denervate the facet joint nerves from C3 through C7, the lateral masses of these respective levels were localized under fluoroscopic visualization.  An outer 18 gauge, 77mm active tip cannula was inserted with an initial target area a few centimeters dorsal to the main target area which is the center point of the trapezoidal area of the articular pillar.  The cannula was then positioned using biplanar imaging to touchdown had an oblique angle into the center of this trapezoidal area.  To denervate the C8 facet joint nerve,  the cannula was fluoroscopically introduced onto the Tl transverse process at its most medial superior end.  For all of these levels, AP and lateral images were used to confirm location.  The radiofrequency probe was inserted into the cannula and stimulation was carried out at both sensory and motor levels to make sure there was expected stimulation without a radicular pattern. Subsequently, this was removed and then 0.5 to 1 ml. of 1% Lidocaine was injected. The radiofrequency probe was re-inserted and denervation of the facet nerves (medial branches of the dorsal rami innervating the facet joints) was carried out at 80-85 degrees Celsius for 60 seconds. Then the cannulas were repositioned to get one additional lesion at each level (total of two) for better efficacy. The above procedure was repeated for each facet joint nerve mentioned above. Radiographs were obtained at each level (unless otherwise noted) to verify probe placement during the neurotomy.   Additional Comments:  The patient tolerated the procedure well Dressing: Band-Aid    Post-procedure details: Patient was observed during the procedure. Post-procedure instructions were reviewed. Patient left the clinic in stable condition.

## 2018-08-10 NOTE — Procedures (Signed)
Cervical Facet Nerve Denervation  Patient: [name]      Date of Birth: [birth year] MRN: [id number] PCP: Eulas Post, MD      Visit Date: 08/02/2018   Universal Protocol:    Date/Time: 06/18/206:10 AM  Consent Given By: the patient  Position: Side Lying  Additional Comments: Vital signs were monitored before and after the procedure. Patient was prepped and draped in the usual sterile fashion. The correct patient, procedure, and site was verified.   Injection Procedure Details:  Procedure Site One Meds Administered:  Meds ordered this encounter  Medications  . methylPREDNISolone acetate (DEPO-MEDROL) injection 80 mg     Laterality: Left  Location/Site:  C2-3 C3-4  Needle size: 20 G  Needle type: RF cannula, 5 mm active tip  Findings:  -Comments:   Procedure Details: The fluoroscope beam was positioned to square off the endplates of the desired vertebral level to achieve a true LATERAL position with care to align facet joints. For each target described below the skin was anesthetized with 1 ml of 1% Lidocaine without epinephrine.  To denervate the facet joint nerve to C2 (third occipital nerve), the cannula was advanced under fluoroscopic guidance and positioned over the inferior lateral portion of the C2/3 facet joint nerve where the third occipital nerve lies.  A minimum of three lesions were made along the location of the nerve.  To denervate the facet joint nerves from C3 through C7, the lateral masses of these respective levels were localized under fluoroscopic visualization.  An outer 18 gauge, 79mm active tip cannula was inserted with an initial target area a few centimeters dorsal to the main target area which is the center point of the trapezoidal area of the articular pillar.  The cannula was then positioned using biplanar imaging to touchdown had an oblique angle into the center of this trapezoidal area.  To denervate the C8 facet joint nerve, the  cannula was fluoroscopically introduced onto the Tl transverse process at its most medial superior end.  For all of these levels, AP and lateral images were used to confirm location.  The radiofrequency probe was inserted into the cannula and stimulation was carried out at both sensory and motor levels to make sure there was expected stimulation without a radicular pattern. Subsequently, this was removed and then 0.5 to 1 ml. of 1% Lidocaine was injected. The radiofrequency probe was re-inserted and denervation of the facet nerves (medial branches of the dorsal rami innervating the facet joints) was carried out at 80-85 degrees Celsius for 60 seconds. Then the cannulas were repositioned to get one additional lesion at each level (total of two) for better efficacy. The above procedure was repeated for each facet joint nerve mentioned above. Radiographs were obtained at each level (unless otherwise noted) to verify probe placement during the neurotomy.   Additional Comments:  The patient tolerated the procedure well Dressing: Band-Aid    Post-procedure details: Patient was observed during the procedure. Post-procedure instructions were reviewed. Patient left the clinic in stable condition.

## 2018-08-10 NOTE — Progress Notes (Signed)
[name] - 64 y.o. female MRN [id number]  Date of birth: June 06, 1955  Office Visit Note: Visit Date: 07/26/2018 PCP: Eulas Post, MD Referred by: Eulas Post, MD  Subjective: Chief Complaint  Patient presents with  . Neck - Pain  . Head - Pain   HPI:  [name] is a 63 y.o. female who comes in today For planned right side radiofrequency ablation of the C2-3 and C3-4 facet joints for upper cervical spine pain.  Please see our prior notes for further details and justification.  Briefly the patient has had chronic persistent recalcitrant upper cervical neck pain particularly pain radiating to the posterior occiput and laterally along the upper cervical spine.  She has been seen for years by her primary care physician Dr. Elease Hashimoto she has had medication management she has had physical therapy and dry needling.  She has had some evaluation and management by Dr. Melina Schools from a surgical standpoint was not really deemed to be a surgical candidate.  She has been followed by Dr. Sarina Ill for problems with chronic headache and neck pain.  We saw her and completed double diagnostic medial branch blocks at C2-3 and C3-4 levels and she got really good relief as evidenced on pain diary.  She has no radicular pain.  She has no imaging showing focal nerve compression.  We are going to complete radiofrequency ablation on the right side today assess more problematic.  Depending how she does would look at radiofrequency ablation on the left.  ROS Otherwise per HPI.  Assessment & Plan: Visit Diagnoses:  1. Cervical spondylosis without myelopathy   2. Cervicalgia     Plan: No additional findings.   Meds & Orders:  Meds ordered this encounter  Medications  . methylPREDNISolone acetate (DEPO-MEDROL) injection 80 mg    Orders Placed This Encounter  Procedures  . Radiofrequency,Cervical  . XR C-ARM NO REPORT    Follow-up: Return if symptoms worsen or fail to improve.    Procedures: No procedures performed  Cervical Facet Nerve Denervation  Patient: [name]      Date of Birth: 1955/06/04 MRN: [id number] PCP: Eulas Post, MD      Visit Date: 07/26/2018   Universal Protocol:    Date/Time: 06/18/206:07 AM  Consent Given By: the patient  Position: Side Lying  Additional Comments: Vital signs were monitored before and after the procedure. Patient was prepped and draped in the usual sterile fashion. The correct patient, procedure, and site was verified.   Injection Procedure Details:  Procedure Site One Meds Administered:  Meds ordered this encounter  Medications  . methylPREDNISolone acetate (DEPO-MEDROL) injection 80 mg     Laterality: Right  Location/Site:  C2-3 C3-4  Needle size: 20 G  Needle type: RF cannula, 5 mm active tip  Findings:  -Comments:   Procedure Details: The fluoroscope beam was positioned to square off the endplates of the desired vertebral level to achieve a true LATERAL position with care to align facet joints. For each target described below the skin was anesthetized with 1 ml of 1% Lidocaine without epinephrine.  To denervate the facet joint nerve to C2 (third occipital nerve), the cannula was advanced under fluoroscopic guidance and positioned over the inferior lateral portion of the C2/3 facet joint nerve where the third occipital nerve lies.  A minimum of three lesions were made along the location of the nerve.  To denervate the facet joint nerves from C3 through C7, the  lateral masses of these respective levels were localized under fluoroscopic visualization.  An outer 18 gauge, 29mm active tip cannula was inserted with an initial target area a few centimeters dorsal to the main target area which is the center point of the trapezoidal area of the articular pillar.  The cannula was then positioned using biplanar imaging to touchdown had an oblique angle into the center of this trapezoidal area.   To denervate the C8 facet joint nerve, the cannula was fluoroscopically introduced onto the Tl transverse process at its most medial superior end.  For all of these levels, AP and lateral images were used to confirm location.  The radiofrequency probe was inserted into the cannula and stimulation was carried out at both sensory and motor levels to make sure there was expected stimulation without a radicular pattern. Subsequently, this was removed and then 0.5 to 1 ml. of 1% Lidocaine was injected. The radiofrequency probe was re-inserted and denervation of the facet nerves (medial branches of the dorsal rami innervating the facet joints) was carried out at 80-85 degrees Celsius for 60 seconds. Then the cannulas were repositioned to get one additional lesion at each level (total of two) for better efficacy. The above procedure was repeated for each facet joint nerve mentioned above. Radiographs were obtained at each level (unless otherwise noted) to verify probe placement during the neurotomy.   Additional Comments:  The patient tolerated the procedure well Dressing: Band-Aid    Post-procedure details: Patient was observed during the procedure. Post-procedure instructions were reviewed. Patient left the clinic in stable condition.      Clinical History: MRI CERVICAL SPINE WITHOUT CONTRAST  TECHNIQUE: Multiplanar, multisequence MR imaging of the cervical spine was performed. No intravenous contrast was administered.  COMPARISON:  Cervical spine radiographs 02/21/2015  FINDINGS: Vertebral alignment is unchanged and near anatomic. Vertebral body heights and intervertebral disc space heights are preserved. Vertebral bone marrow signal is unremarkable. Craniocervical junction is unremarkable. The cervical spinal cord is normal in caliber and signal. Paraspinal soft tissues are unremarkable.  C2-3:  Negative.  C3-4: At most minimal left facet arthrosis without disc herniation or  stenosis.  C4-5: Minimal disc bulging, minimal right uncovertebral spurring, and minimal facet arthrosis result in minimal right neural foraminal narrowing without spinal stenosis.  C5-6:  Negative.  C6-7:  Negative.  C7-T1: Mild left greater than right facet arthrosis without disc herniation or stenosis.  IMPRESSION: Minimal cervical spondylosis and facet arthrosis without evidence of neural impingement.   Electronically Signed   By: Logan Bores M.D.   On: 04/18/2015 08:05     Objective:  VS:  HT:    WT:   BMI:     BP:(!) 152/78  HR:73bpm  TEMP: ( )  RESP:  Physical Exam Musculoskeletal:     Comments: Patient sits with forward flexed cervical spine has concordant pain with rotation left and right with right more than left pain.  She has some pain with extension and facet loading.  No radicular pain negative Spurling's test good distal strength in the upper extremities.     Ortho Exam Imaging: No results found.

## 2018-08-23 ENCOUNTER — Other Ambulatory Visit: Payer: Self-pay

## 2018-08-23 ENCOUNTER — Ambulatory Visit
Admission: RE | Admit: 2018-08-23 | Discharge: 2018-08-23 | Disposition: A | Discharge status: Home / Self Care | Payer: Medicare Other | Source: Ambulatory Visit | Attending: Family Medicine | Admitting: Family Medicine

## 2018-08-23 DIAGNOSIS — Z1231 Encounter for screening mammogram for malignant neoplasm of breast: Secondary | ICD-10-CM

## 2018-09-05 DIAGNOSIS — D0439 Carcinoma in situ of skin of other parts of face: Secondary | ICD-10-CM | POA: Diagnosis not present

## 2018-09-05 DIAGNOSIS — Z85828 Personal history of other malignant neoplasm of skin: Secondary | ICD-10-CM | POA: Diagnosis not present

## 2018-09-05 DIAGNOSIS — L570 Actinic keratosis: Secondary | ICD-10-CM | POA: Diagnosis not present

## 2018-09-09 ENCOUNTER — Other Ambulatory Visit: Payer: Self-pay | Admitting: Family Medicine

## 2018-09-15 DIAGNOSIS — Z800 Family history of malignant neoplasm of digestive organs: Secondary | ICD-10-CM | POA: Diagnosis not present

## 2018-09-15 DIAGNOSIS — Z1211 Encounter for screening for malignant neoplasm of colon: Secondary | ICD-10-CM | POA: Diagnosis not present

## 2018-09-15 HISTORY — PX: COLONOSCOPY: SHX174

## 2018-09-26 DIAGNOSIS — L570 Actinic keratosis: Secondary | ICD-10-CM | POA: Diagnosis not present

## 2018-09-26 DIAGNOSIS — Z85828 Personal history of other malignant neoplasm of skin: Secondary | ICD-10-CM | POA: Diagnosis not present

## 2018-09-26 DIAGNOSIS — D0439 Carcinoma in situ of skin of other parts of face: Secondary | ICD-10-CM | POA: Diagnosis not present

## 2018-10-15 ENCOUNTER — Encounter: Payer: Self-pay | Admitting: Family Medicine

## 2018-10-16 ENCOUNTER — Other Ambulatory Visit: Payer: Self-pay

## 2018-10-16 MED ORDER — EPINEPHRINE 0.3 MG/0.3ML IJ SOAJ: INTRAMUSCULAR | 1 refills | Status: AC

## 2018-10-26 ENCOUNTER — Ambulatory Visit: Payer: Medicare Other | Admitting: Podiatry

## 2018-10-26 ENCOUNTER — Other Ambulatory Visit: Payer: Medicare Other | Admitting: Orthotics

## 2018-10-31 ENCOUNTER — Ambulatory Visit: Payer: Medicare Other | Admitting: Podiatry

## 2018-11-07 ENCOUNTER — Ambulatory Visit: Payer: Medicare Other | Admitting: Podiatry

## 2018-11-07 ENCOUNTER — Other Ambulatory Visit: Payer: Medicare Other | Admitting: Orthotics

## 2018-11-09 ENCOUNTER — Other Ambulatory Visit: Payer: Self-pay

## 2018-11-09 ENCOUNTER — Ambulatory Visit (INDEPENDENT_AMBULATORY_CARE_PROVIDER_SITE_OTHER): Payer: Medicare Other | Admitting: Orthotics

## 2018-11-09 ENCOUNTER — Ambulatory Visit (INDEPENDENT_AMBULATORY_CARE_PROVIDER_SITE_OTHER): Payer: Medicare Other | Admitting: Podiatry

## 2018-11-09 DIAGNOSIS — M779 Enthesopathy, unspecified: Secondary | ICD-10-CM

## 2018-11-09 DIAGNOSIS — M2580 Other specified joint disorders, unspecified joint: Secondary | ICD-10-CM | POA: Diagnosis not present

## 2018-11-09 DIAGNOSIS — M7742 Metatarsalgia, left foot: Secondary | ICD-10-CM

## 2018-11-09 DIAGNOSIS — M7752 Other enthesopathy of left foot: Secondary | ICD-10-CM

## 2018-11-09 DIAGNOSIS — M76829 Posterior tibial tendinitis, unspecified leg: Secondary | ICD-10-CM

## 2018-11-09 NOTE — Progress Notes (Signed)
Subjective: 63 year old female presents the office today for follow-up evaluation of left ankle pain as well as pain to the ball of her left foot.  She states that in the last saw her her pain has remained the same and somewhat worsened.  She is presenting today also to be measured for orthotics.  She states when she stands you feel that she is walking on bones.  Denies any recent injury.  Occasional swelling but no redness or warmth. Denies any systemic complaints such as fevers, chills, nausea, vomiting. No acute changes since last appointment, and no other complaints at this time.   Prescribed prednisone for rheumatoid flare.  Objective: AAO x3, NAD DP/PT pulses palpable bilaterally, CRT less than 3 seconds Upon weightbearing there is a decrease in medial arch height on the right side worse than left.  There is prominence the metatarsal ends plantarly with atrophy of the fat pad.  There is tenderness plantar aspect the sesamoids.  Mild edema to this area no erythema or warmth.  Mild tenderness palpation on the flexor tendons on medial aspect ankle mild along peroneal tendon.  No significant edema to the ankle. No pain with calf compression, swelling, warmth, erythema  Assessment: Left ankle posterior tibial tendinitis, sesamoiditis/metatarsalgia  Plan: -All treatment options discussed with the patient including all alternatives, risks, complications.  -Steroid injection performed to the sesamoid complex.  Mixture of 1 cc dexamethasone and 1 cc Marcaine plain was infiltrated in the sesamoid complex on the area maximal tenderness without complications.  Postinjection care discussed.  Offloading pads dispensed. -She was measured for orthotics today with Liliane Channel. -Symptoms continue discussed MRI to rule out tendon tear.    Return in about 3 months (around 02/08/2019).  Trula Slade DPM

## 2018-11-09 NOTE — Progress Notes (Signed)
Patient presents today with a hx of PTTD/AAF.  Upon assessment, patient has pronounced pes planus w/ a valgus RF deformity.  Patient has medially shifted talus/navicular.  Goal is provide longitudinal arch support and RF stability.  Plan on deep heel cup, hug arch, wide foot orthosis w/ medial flange and varus correction for RF valgus deformity.  Patient educated in the progessive nature of PTTD and financial responsibility.  

## 2018-11-17 ENCOUNTER — Encounter: Payer: Self-pay | Admitting: Family Medicine

## 2018-11-17 MED ORDER — ZOLPIDEM TARTRATE 10 MG PO TABS: 10.0000 mg | ORAL_TABLET | Freq: Every evening | ORAL | 1 refills | Status: DC | PRN

## 2018-11-20 DIAGNOSIS — M1711 Unilateral primary osteoarthritis, right knee: Secondary | ICD-10-CM | POA: Diagnosis not present

## 2018-11-20 DIAGNOSIS — M25561 Pain in right knee: Secondary | ICD-10-CM | POA: Diagnosis not present

## 2018-11-27 DIAGNOSIS — M150 Primary generalized (osteo)arthritis: Secondary | ICD-10-CM | POA: Diagnosis not present

## 2018-11-27 DIAGNOSIS — M797 Fibromyalgia: Secondary | ICD-10-CM | POA: Diagnosis not present

## 2018-11-27 DIAGNOSIS — M25512 Pain in left shoulder: Secondary | ICD-10-CM | POA: Diagnosis not present

## 2018-11-27 DIAGNOSIS — M0589 Other rheumatoid arthritis with rheumatoid factor of multiple sites: Secondary | ICD-10-CM | POA: Diagnosis not present

## 2018-11-27 DIAGNOSIS — M79672 Pain in left foot: Secondary | ICD-10-CM | POA: Diagnosis not present

## 2018-12-07 ENCOUNTER — Ambulatory Visit: Payer: Medicare Other | Admitting: Orthotics

## 2018-12-07 ENCOUNTER — Other Ambulatory Visit: Payer: Self-pay

## 2018-12-07 DIAGNOSIS — M76829 Posterior tibial tendinitis, unspecified leg: Secondary | ICD-10-CM

## 2018-12-07 DIAGNOSIS — M7742 Metatarsalgia, left foot: Secondary | ICD-10-CM

## 2018-12-07 NOTE — Progress Notes (Signed)
Patient came in today to pick up custom made foot orthotics.  The goals were accomplished and the patient reported no dissatisfaction with said orthotics.  Patient was advised of breakin period and how to report any issues. 

## 2018-12-20 DIAGNOSIS — M79642 Pain in left hand: Secondary | ICD-10-CM | POA: Diagnosis not present

## 2018-12-28 DIAGNOSIS — L570 Actinic keratosis: Secondary | ICD-10-CM | POA: Diagnosis not present

## 2018-12-28 DIAGNOSIS — Z85828 Personal history of other malignant neoplasm of skin: Secondary | ICD-10-CM | POA: Diagnosis not present

## 2018-12-28 DIAGNOSIS — D0439 Carcinoma in situ of skin of other parts of face: Secondary | ICD-10-CM | POA: Diagnosis not present

## 2019-01-31 DIAGNOSIS — M7541 Impingement syndrome of right shoulder: Secondary | ICD-10-CM | POA: Diagnosis not present

## 2019-01-31 DIAGNOSIS — M25561 Pain in right knee: Secondary | ICD-10-CM | POA: Diagnosis not present

## 2019-01-31 DIAGNOSIS — M25511 Pain in right shoulder: Secondary | ICD-10-CM | POA: Diagnosis not present

## 2019-01-31 DIAGNOSIS — M1711 Unilateral primary osteoarthritis, right knee: Secondary | ICD-10-CM | POA: Diagnosis not present

## 2019-02-05 ENCOUNTER — Telehealth (INDEPENDENT_AMBULATORY_CARE_PROVIDER_SITE_OTHER): Payer: Medicare Other | Admitting: Family Medicine

## 2019-02-05 ENCOUNTER — Other Ambulatory Visit: Payer: Self-pay

## 2019-02-05 DIAGNOSIS — R059 Cough, unspecified: Secondary | ICD-10-CM

## 2019-02-05 DIAGNOSIS — R05 Cough: Secondary | ICD-10-CM | POA: Diagnosis not present

## 2019-02-05 MED ORDER — BENZONATATE 100 MG PO CAPS: 100.0000 mg | ORAL_CAPSULE | Freq: Three times a day (TID) | ORAL | 0 refills | Status: DC | PRN

## 2019-02-05 MED ORDER — DOXYCYCLINE HYCLATE 100 MG PO CAPS: 100.0000 mg | ORAL_CAPSULE | Freq: Two times a day (BID) | ORAL | 0 refills | Status: DC

## 2019-02-05 NOTE — Progress Notes (Signed)
This visit type was conducted due to national recommendations for restrictions regarding the COVID-19 pandemic in an effort to limit this patient's exposure and mitigate transmission in our community.   Virtual Visit via Video Note  I connected with [name] on 02/05/19 at  2:30 PM EST by a video enabled telemedicine application and verified that I am speaking with the correct person using two identifiers.  Location patient: home Location provider:work or home office Persons participating in the virtual visit: patient, provider  I discussed the limitations of evaluation and management by telemedicine and the availability of in person appointments. The patient expressed understanding and agreed to proceed.   HPI: [name] seen by virtual visit with onset a few days ago of cough.  She has had some postnasal drainage.  She had complicated sinusitis in the past with severe sphenoid sinusitis requiring surgery in the past.  She is concerned because she has had some thick and colored nasal drainage along with daily headaches typical of previous sinusitis.  No fever.  No dyspnea.  No body aches.  No diarrhea.  No loss of taste or smell.  No known sick contacts.  She is a non-smoker.  Her cough has been fairly severe at night.  Requesting suppressant.   ROS: See pertinent positives and negatives per HPI.  Past Medical History:  Diagnosis Date  . ANGIOMA 10/03/2008   REMOVED FROM RIGHT LOWER LEG-BENIGN  . Anxiety   . Arthritis    RA  . Bronchitis    hx of   . Cancer (HCC)    BASAL CELL SKIN CANCER  . Complication of anesthesia   . Fibromyalgia   . GERD 08/27/2008  . History of blood transfusion    1995  . History of hiatal hernia    hx of has had surgically repaired  . History of measles   . History of mumps   . History of nonmelanoma skin cancer   . HYPERLIPIDEMIA 08/27/2008  . HYPERTENSION 08/27/2008   pt is currently on no meds   . Pneumonia    hx of   . PONV (postoperative nausea and  vomiting)   . Rheumatoid arthritis ([place])   . SEBORRHEIC KERATOSIS 08/27/2008    Past Surgical History:  Procedure Laterality Date  . ABDOMINAL HYSTERECTOMY  1995  . APPENDECTOMY    . BREAST BIOPSY Left   . BREAST DUCTAL SYSTEM EXCISION  08/16/2011   Procedure: EXCISION DUCTAL SYSTEM BREAST;  Surgeon: Joyice Faster. Cornett, MD;  Location: [facility];  Service: General;  Laterality: Left;  left breast duct excision  . [place]   times 2; 1980 and 1982  . [place]  . CHOLECYSTECTOMY  1997  . EXCISION/RELEASE BURSA HIP Right 10/21/2014   Procedure: OPEN RIGHT HIP BURSECTOMY, EXOSECTOMY;  Surgeon: Paralee Cancel, MD;  Location: [facility];  Service: Orthopedics;  Laterality: Right;  . foot surgery  2004, 2005, 2008   bilat secondary to neuropathy   . JOINT REPLACEMENT  10,12   lt total knee X2  . KNEE ARTHROSCOPY  2007, 2008, 2009  . LAPAROSCOPIC GASTRIC SLEEVE RESECTION N/A 10/30/2013   Procedure: LAPAROSCOPIC GASTRIC SLEEVE RESECTION WITH HIATAL HERNIA REPAIR AND UPPER ENDOSCOPY;  Surgeon: Greer Pickerel, MD;  Location: [facility];  Service: General;  Laterality: N/A;  . NASAL SINUS SURGERY  2013  . OPEN SURGICAL REPAIR OF GLUTEAL TENDON  01/17/2012   Procedure: OPEN SURGICAL REPAIR OF GLUTEAL TENDON;  Surgeon: Pietro Cassis  Alvan Dame, MD;  Location: [facility];  Service: Orthopedics;  Laterality: Right;    Family History  Problem Relation Age of Onset  . Cancer Mother        breast  . Breast cancer Mother 65  . Kidney disease Father   . Epilepsy Father   . Heart attack Sister   . Heart attack Sister   . Diabetes Son     SOCIAL HX: Non-smoker   Current Outpatient Medications:  .  acetaminophen (TYLENOL) 500 MG tablet, Take 1,000 mg by mouth every 6 (six) hours as needed for pain. , Disp: , Rfl:  .  benzonatate (TESSALON) 100 MG capsule, Take 1 capsule (100 mg total) by mouth 3 (three) times daily as needed for cough., Disp: 30 capsule, Rfl: 0 .  Biotin 1000 MCG  tablet, Take 3,000 mcg by mouth daily., Disp: , Rfl:  .  cyclobenzaprine (FLEXERIL) 5 MG tablet, Take 5 mg by mouth 2 (two) times daily., Disp: , Rfl:  .  doxycycline (VIBRAMYCIN) 100 MG capsule, Take 1 capsule (100 mg total) by mouth 2 (two) times daily., Disp: 20 capsule, Rfl: 0 .  EPINEPHrine (EPIPEN 2-PAK) 0.3 mg/0.3 mL IJ SOAJ injection, Inject 0.3 mLs (0.3 mg total) into the muscle once., Disp: 2 each, Rfl: 1 .  estradiol (ESTRACE) 1 MG tablet, Take 1 tablet by mouth once daily, Disp: 90 tablet, Rfl: 0 .  etanercept (ENBREL) 50 MG/ML injection, Inject 0.98 mLs (50 mg total) into the skin once a week. Resume end of next week (Patient taking differently: Inject 50 mg into the skin once a week. Friday), Disp: 0.98 mL, Rfl: 0 .  folic acid (FOLVITE) 1 MG tablet, Take 2 mg by mouth daily. , Disp: , Rfl:  .  hydroxychloroquine (PLAQUENIL) 200 MG tablet, TAKE 1 TABLET BY MOUTH ONCE DAILY WITH FOOD OR MILK FOR 90 DAYS, Disp: , Rfl:  .  lisinopril (ZESTRIL) 10 MG tablet, Take 1 tablet (10 mg total) by mouth daily., Disp: 90 tablet, Rfl: 3 .  methotrexate (50 MG/ML) 1 gm SOLR, May resume previous dose at  end of next week (Patient taking differently: Inject 50 mg into the skin once a week. Friday), Disp: , Rfl:  .  ondansetron (ZOFRAN ODT) 4 MG disintegrating tablet, 4mg  ODT q4 hours prn nausea/vomit, Disp: 20 tablet, Rfl: 0 .  oxyCODONE-acetaminophen (PERCOCET/ROXICET) 5-325 MG tablet, TAKE 1 TABLET AS NEEDED EVERY 8 HOURS FOR PAIN, Disp: , Rfl: 0 .  predniSONE (DELTASONE) 5 MG tablet, Take 5 mg by mouth daily with breakfast., Disp: , Rfl:  .  scopolamine (TRANSDERM-SCOP) 1 MG/3DAYS, Place 1 patch (1.5 mg total) onto the skin every 3 (three) days. Behind ear, Disp: 10 patch, Rfl: 0 .  sertraline (ZOLOFT) 50 MG tablet, TAKE 1 & 1/2 (ONE & ONE-HALF) TABLETS BY MOUTH ONCE DAILY, Disp: 135 tablet, Rfl: 0 .  zolpidem (AMBIEN) 10 MG tablet, Take 1 tablet (10 mg total) by mouth at bedtime as needed. for sleep,  Disp: 90 tablet, Rfl: 1  EXAM:  VITALS per patient if applicable:  GENERAL: alert, oriented, appears well and in no acute distress  HEENT: atraumatic, conjunttiva clear, no obvious abnormalities on inspection of external nose and ears  NECK: normal movements of the head and neck  LUNGS: on inspection no signs of respiratory distress, breathing rate appears normal, no obvious gross SOB, gasping or wheezing  CV: no obvious cyanosis  MS: moves all visible extremities without noticeable abnormality  PSYCH/NEURO: pleasant and  cooperative, no obvious depression or anxiety, speech and thought processing grossly intact  ASSESSMENT AND PLAN:  Discussed the following assessment and plan:  Cough.  Suspect probably related to recurrent sinusitis.  We recommended the following  -We suggested Covid testing and quarantine until clarified further.  She is looking at possibly going to a local [facility] -Doxycycline 100 mg twice daily for 10 days.  She has penicillin allergic -Tessalon Perles 100 mg every 8 hours as needed for cough #30 with no refill -Follow-up virtually or by phone for any increasing fever, dyspnea, or other concerns     I discussed the assessment and treatment plan with the patient. The patient was provided an opportunity to ask questions and all were answered. The patient agreed with the plan and demonstrated an understanding of the instructions.   The patient was advised to call back or seek an in-person evaluation if the symptoms worsen or if the condition fails to improve as anticipated.    Carolann Littler, MD

## 2019-02-08 ENCOUNTER — Ambulatory Visit: Payer: Medicare Other | Admitting: Podiatry

## 2019-02-08 ENCOUNTER — Other Ambulatory Visit: Payer: Self-pay

## 2019-02-08 DIAGNOSIS — M7742 Metatarsalgia, left foot: Secondary | ICD-10-CM

## 2019-02-08 DIAGNOSIS — M7752 Other enthesopathy of left foot: Secondary | ICD-10-CM

## 2019-02-10 DIAGNOSIS — M7541 Impingement syndrome of right shoulder: Secondary | ICD-10-CM | POA: Insufficient documentation

## 2019-02-11 NOTE — Progress Notes (Signed)
Subjective: 63 year old female presents the office today for follow evaluation of left foot pain.  She said injection was not helpful.  She feels the right orthotic is very comfortable with the left orthotic is not as comfortable.  She denies any recent injury, or any changes otherwise. Denies any systemic complaints such as fevers, chills, nausea, vomiting. No acute changes since last appointment, and no other complaints at this time.   Objective: AAO x3, NAD DP/PT pulses palpable bilaterally, CRT less than 3 seconds Majority tenderness is on the left foot on the sesamoid complex plantarly as well and there is a bursa present off of the medial aspect the first MPJ which is tender.  No other significant discomfort on the left side. No open lesions or pre-ulcerative lesions.  No pain with calf compression, swelling, warmth, erythema  Assessment: Sesamoiditis, bursa left foot  Plan: -All treatment options discussed with the patient including all alternatives, risks, complications.  -Today an injection was performed into the bursa on the medial aspect the first metatarsal head.  There is no with alcohol and then a mixture of 0.5 cc of dexamethasone phosphate and 0.5 cc of Marcaine plain was infiltrated without complications.  I dispensed offloading pads.  Also I had Liliane Channel evaluate her today for modifications of the left orthotic as it does not appear to be fitting as well as the right side. -Patient encouraged to call the office with any questions, concerns, change in symptoms.   Trula Slade DPM

## 2019-02-28 ENCOUNTER — Other Ambulatory Visit: Payer: Self-pay | Admitting: Family Medicine

## 2019-03-01 ENCOUNTER — Other Ambulatory Visit: Payer: Medicare Other | Admitting: Orthotics

## 2019-03-07 DIAGNOSIS — M25512 Pain in left shoulder: Secondary | ICD-10-CM | POA: Diagnosis not present

## 2019-03-07 DIAGNOSIS — M797 Fibromyalgia: Secondary | ICD-10-CM | POA: Diagnosis not present

## 2019-03-07 DIAGNOSIS — M79672 Pain in left foot: Secondary | ICD-10-CM | POA: Diagnosis not present

## 2019-03-07 DIAGNOSIS — M0589 Other rheumatoid arthritis with rheumatoid factor of multiple sites: Secondary | ICD-10-CM | POA: Diagnosis not present

## 2019-03-07 DIAGNOSIS — M150 Primary generalized (osteo)arthritis: Secondary | ICD-10-CM | POA: Diagnosis not present

## 2019-03-14 DIAGNOSIS — M0589 Other rheumatoid arthritis with rheumatoid factor of multiple sites: Secondary | ICD-10-CM | POA: Diagnosis not present

## 2019-03-22 ENCOUNTER — Ambulatory Visit (INDEPENDENT_AMBULATORY_CARE_PROVIDER_SITE_OTHER): Payer: Medicare Other

## 2019-03-22 ENCOUNTER — Ambulatory Visit: Payer: Medicare Other | Admitting: Podiatry

## 2019-03-22 ENCOUNTER — Encounter: Payer: Self-pay | Admitting: Podiatry

## 2019-03-22 ENCOUNTER — Other Ambulatory Visit: Payer: Self-pay

## 2019-03-22 DIAGNOSIS — M79672 Pain in left foot: Secondary | ICD-10-CM

## 2019-03-22 DIAGNOSIS — M779 Enthesopathy, unspecified: Secondary | ICD-10-CM

## 2019-03-22 DIAGNOSIS — M7742 Metatarsalgia, left foot: Secondary | ICD-10-CM

## 2019-03-22 DIAGNOSIS — G8929 Other chronic pain: Secondary | ICD-10-CM | POA: Diagnosis not present

## 2019-03-22 DIAGNOSIS — M2580 Other specified joint disorders, unspecified joint: Secondary | ICD-10-CM

## 2019-03-22 NOTE — Patient Instructions (Signed)

## 2019-03-23 ENCOUNTER — Encounter: Payer: Self-pay | Admitting: *Deleted

## 2019-03-23 NOTE — Progress Notes (Signed)
Subjective: 64 year old female presents the office today for follow evaluation of left foot pain.  She has tried offloading, orthotics as well as injections with insignificant improvement.  She states that she gets pain to her left foot still pointing the first MPJ as well as the sesamoids.  She does get pain when she tries to move her big toe joint as well. Denies any systemic complaints such as fevers, chills, nausea, vomiting. No acute changes since last appointment, and no other complaints at this time.   Objective: AAO x3, NAD DP/PT pulses palpable bilaterally, CRT less than 3 seconds Majority tenderness is on the left foot on the sesamoid complex plantarly but she also has discomfort along the first MPJ itself and with range of motion.  The bursa that was present in the medial aspect is not present today.  Previous metatarsal head resections of 2 through 5.  No open lesions or pre-ulcerative lesions.  No pain with calf compression, swelling, warmth, erythema  Assessment: Sesamoiditis, capsulitis left foot first MPJ  Plan: -All treatment options discussed with the patient including all alternatives, risks, complications.  -Repeat x-rays were obtained and reviewed.  He was marked and utilized to identify the area plantarly where she gets pain to confirm this of the sesamoids.  There is no evidence of acute fracture.  Arthritis present the first MPJ.  Metatarsal head resections of 2 through 5. -Today we discussed both conservative as well as surgical treatment options.  After long discussion she also proceed with surgical intervention.  As she gets discomfort on the first MPJ discussed first MTPJ arthrodesis with shaving of the sesamoids.  We discussed IPJ fusion with sesamoidectomy however since the joint itself is causing pain we will do the MPJ arthrodesis. -Plan for left first MPJ arthrodesis with sesamoid planing -The incision placement as well as the postoperative course was discussed with  the patient. I discussed risks of the surgery which include, but not limited to, infection, bleeding, pain, swelling, need for further surgery, delayed or nonhealing, painful or ugly scar, numbness or sensation changes, over/under correction, recurrence, transfer lesions, further deformity, hardware failure, DVT/PE, loss of toe/foot. Patient understands these risks and wishes to proceed with surgery. The surgical consent was reviewed with the patient all 3 pages were signed. No promises or guarantees were given to the outcome of the procedure. All questions were answered to the best of my ability. Before the surgery the patient was encouraged to call the office if there is any further questions. The surgery will be performed at the [facility] on an outpatient basis.  Return for postop visit .  Trula Slade DPM

## 2019-03-23 NOTE — Progress Notes (Signed)
Per Dr. Jacqualyn Posey, I sent a surgical medical clearance request letter to Dr. Leigh Aurora.  [name] is scheduled to have surgery on April 25, 2019.

## 2019-04-05 ENCOUNTER — Telehealth: Payer: Self-pay | Admitting: Podiatry

## 2019-04-05 ENCOUNTER — Other Ambulatory Visit: Payer: Self-pay | Admitting: Podiatry

## 2019-04-05 DIAGNOSIS — M779 Enthesopathy, unspecified: Secondary | ICD-10-CM

## 2019-04-05 DIAGNOSIS — M7742 Metatarsalgia, left foot: Secondary | ICD-10-CM

## 2019-04-05 NOTE — Telephone Encounter (Signed)
DOS: 04/25/2019  SURGICAL PROCEDURE: Hallux MPJ Fusion with shaving of Sesamoid Bones VHAW(89340).  [employer] Medicare Effective: 02/23/2019 -  Out of Pocket: $3,600 with $45 met and $3,555 remains.   Notification or Prior Authorization is not required for the requested services  This [employer] Advantage members plan does not currently require a prior authorization for these services. If you have general questions about the prior authorization requirements, please call us at [phone] or visit [email or web address] > Clinician Resources > Advance and Admission Notification Requirements. The number above acknowledges your notification. Please write this number down for future reference. Notification is not a guarantee of coverage or payment.  Decision ID #:[id number]  The number above acknowledges your inquiry and our response. Please write this number down and refer to it for future inquiries. Coverage and payment for an item or service is governed by the member's benefit plan document, and, if applicable, the provider's participation agreement with the Health Plan.

## 2019-04-05 NOTE — Telephone Encounter (Signed)
Left message for pt regarding the medical clearance we received from Dr. Amil Amen. Told her per Dr. Melissa Noon instructions to HOLD the Enbrel one week before and one week after surgery. Also to her to continue Methotrexate, Plaquenil, and Prednisone 5 mg peri-operatively. Told pt to call us back with any questions.

## 2019-04-05 NOTE — Progress Notes (Signed)
Knee scooter ordered 

## 2019-04-06 ENCOUNTER — Telehealth: Payer: Self-pay | Admitting: *Deleted

## 2019-04-06 NOTE — Telephone Encounter (Signed)
Called patient to let her know that there were a couple of options for the knee scooter and one was to go on [address] and purchase one to keep or the [employer] off of [address] was $35.00 a month but I told patient I was not sure about the amount and patient stated that she may have to reschedule the surgery due to her sister feel and crushed on leg and was having to take care of their dad and I stated to call the office if any concerns or questions or if they needed anything. Lattie Haw

## 2019-04-06 NOTE — Telephone Encounter (Signed)
-----   Message from Cleon Gustin sent at 04/05/2019 10:50 AM EST ----- Regarding: Order for Knee Scooter Pt is scheduled for sx on 03/03 and she needs an order for a knee scooter.  Thanks.

## 2019-04-11 ENCOUNTER — Other Ambulatory Visit: Payer: Self-pay

## 2019-04-11 ENCOUNTER — Telehealth (INDEPENDENT_AMBULATORY_CARE_PROVIDER_SITE_OTHER): Payer: Medicare Other | Admitting: Family Medicine

## 2019-04-11 ENCOUNTER — Telehealth: Payer: Self-pay

## 2019-04-11 DIAGNOSIS — Z03818 Encounter for observation for suspected exposure to other biological agents ruled out: Secondary | ICD-10-CM | POA: Diagnosis not present

## 2019-04-11 DIAGNOSIS — J029 Acute pharyngitis, unspecified: Secondary | ICD-10-CM | POA: Diagnosis not present

## 2019-04-11 DIAGNOSIS — R197 Diarrhea, unspecified: Secondary | ICD-10-CM | POA: Diagnosis not present

## 2019-04-11 DIAGNOSIS — R5383 Other fatigue: Secondary | ICD-10-CM | POA: Diagnosis not present

## 2019-04-11 NOTE — Progress Notes (Signed)
This visit type was conducted due to national recommendations for restrictions regarding the COVID-19 pandemic in an effort to limit this patient's exposure and mitigate transmission in our community.   Virtual Visit via Video Note  I connected with [name] on 04/11/19 at 10:15 AM EST by a video enabled telemedicine application and verified that I am speaking with the correct person using two identifiers.  Location patient: home Location provider:work or home office Persons participating in the virtual visit: patient, provider  I discussed the limitations of evaluation and management by telemedicine and the availability of in person appointments. The patient expressed understanding and agreed to proceed.   HPI:  [name] has acute respiratory symptoms.  She started Sunday with sore throat.  She has developed some fatigue and chills without fever.  She has had some mild cough but no dyspnea.  Mild diarrhea.  Intermittent headache.  No loss of taste or smell.  No known sick contacts.  She is scheduled for Covid test later today at [facility].  She has taken Tylenol for headaches.  No nausea or vomiting.  She does have rheumatoid arthritis and is on Enbrel for that.  Non-smoker.  No chronic lung disease.  No heart history.   ROS: See pertinent positives and negatives per HPI.  Past Medical History:  Diagnosis Date  . ANGIOMA 10/03/2008   REMOVED FROM RIGHT LOWER LEG-BENIGN  . Anxiety   . Arthritis    RA  . Bronchitis    hx of   . Cancer (HCC)    BASAL CELL SKIN CANCER  . Complication of anesthesia   . Fibromyalgia   . GERD 08/27/2008  . History of blood transfusion    1995  . History of hiatal hernia    hx of has had surgically repaired  . History of measles   . History of mumps   . History of nonmelanoma skin cancer   . HYPERLIPIDEMIA 08/27/2008  . HYPERTENSION 08/27/2008   pt is currently on no meds   . Pneumonia    hx of   . PONV (postoperative nausea and vomiting)   .  Rheumatoid arthritis ([place])   . SEBORRHEIC KERATOSIS 08/27/2008    Past Surgical History:  Procedure Laterality Date  . ABDOMINAL HYSTERECTOMY  1995  . APPENDECTOMY    . BREAST BIOPSY Left   . BREAST DUCTAL SYSTEM EXCISION  08/16/2011   Procedure: EXCISION DUCTAL SYSTEM BREAST;  Surgeon: Joyice Faster. Cornett, MD;  Location: [facility];  Service: General;  Laterality: Left;  left breast duct excision  . [place]   times 2; 1980 and 1982  . [place]  . CHOLECYSTECTOMY  1997  . EXCISION/RELEASE BURSA HIP Right 10/21/2014   Procedure: OPEN RIGHT HIP BURSECTOMY, EXOSECTOMY;  Surgeon: Paralee Cancel, MD;  Location: [facility];  Service: Orthopedics;  Laterality: Right;  . foot surgery  2004, 2005, 2008   bilat secondary to neuropathy   . JOINT REPLACEMENT  10,12   lt total knee X2  . KNEE ARTHROSCOPY  2007, 2008, 2009  . LAPAROSCOPIC GASTRIC SLEEVE RESECTION N/A 10/30/2013   Procedure: LAPAROSCOPIC GASTRIC SLEEVE RESECTION WITH HIATAL HERNIA REPAIR AND UPPER ENDOSCOPY;  Surgeon: Greer Pickerel, MD;  Location: [facility];  Service: General;  Laterality: N/A;  . NASAL SINUS SURGERY  2013  . OPEN SURGICAL REPAIR OF GLUTEAL TENDON  01/17/2012   Procedure: OPEN SURGICAL REPAIR OF GLUTEAL TENDON;  Surgeon: Mauri Pole, MD;  Location: [facility];  Service: Orthopedics;  Laterality: Right;    Family History  Problem Relation Age of Onset  . Cancer Mother        breast  . Breast cancer Mother 74  . Kidney disease Father   . Epilepsy Father   . Heart attack Sister   . Heart attack Sister   . Diabetes Son     SOCIAL HX: Non-smoker   Current Outpatient Medications:  .  acetaminophen (TYLENOL) 500 MG tablet, Take 1,000 mg by mouth every 6 (six) hours as needed for pain. , Disp: , Rfl:  .  benzonatate (TESSALON) 100 MG capsule, Take 1 capsule (100 mg total) by mouth 3 (three) times daily as needed for cough., Disp: 30 capsule, Rfl: 0 .  Biotin 1000 MCG tablet, Take 3,000  mcg by mouth daily., Disp: , Rfl:  .  cyclobenzaprine (FLEXERIL) 5 MG tablet, Take 5 mg by mouth 2 (two) times daily., Disp: , Rfl:  .  doxycycline (VIBRAMYCIN) 100 MG capsule, Take 1 capsule (100 mg total) by mouth 2 (two) times daily., Disp: 20 capsule, Rfl: 0 .  EPINEPHrine (EPIPEN 2-PAK) 0.3 mg/0.3 mL IJ SOAJ injection, Inject 0.3 mLs (0.3 mg total) into the muscle once., Disp: 2 each, Rfl: 1 .  estradiol (ESTRACE) 1 MG tablet, Take 1 tablet by mouth once daily, Disp: 90 tablet, Rfl: 0 .  etanercept (ENBREL) 50 MG/ML injection, Inject 0.98 mLs (50 mg total) into the skin once a week. Resume end of next week (Patient taking differently: Inject 50 mg into the skin once a week. Friday), Disp: 0.98 mL, Rfl: 0 .  folic acid (FOLVITE) 1 MG tablet, Take 2 mg by mouth daily. , Disp: , Rfl:  .  hydroxychloroquine (PLAQUENIL) 200 MG tablet, TAKE 1 TABLET BY MOUTH ONCE DAILY WITH FOOD OR MILK FOR 90 DAYS, Disp: , Rfl:  .  lisinopril (ZESTRIL) 10 MG tablet, Take 1 tablet (10 mg total) by mouth daily., Disp: 90 tablet, Rfl: 3 .  methotrexate (50 MG/ML) 1 gm SOLR, May resume previous dose at  end of next week (Patient taking differently: Inject 50 mg into the skin once a week. Friday), Disp: , Rfl:  .  ondansetron (ZOFRAN ODT) 4 MG disintegrating tablet, 4mg  ODT q4 hours prn nausea/vomit, Disp: 20 tablet, Rfl: 0 .  oxyCODONE-acetaminophen (PERCOCET/ROXICET) 5-325 MG tablet, TAKE 1 TABLET AS NEEDED EVERY 8 HOURS FOR PAIN, Disp: , Rfl: 0 .  predniSONE (DELTASONE) 5 MG tablet, Take 5 mg by mouth daily with breakfast., Disp: , Rfl:  .  scopolamine (TRANSDERM-SCOP) 1 MG/3DAYS, Place 1 patch (1.5 mg total) onto the skin every 3 (three) days. Behind ear, Disp: 10 patch, Rfl: 0 .  sertraline (ZOLOFT) 50 MG tablet, TAKE 1 & 1/2 (ONE & ONE-HALF) TABLETS BY MOUTH ONCE DAILY, Disp: 135 tablet, Rfl: 0 .  zolpidem (AMBIEN) 10 MG tablet, Take 1 tablet (10 mg total) by mouth at bedtime as needed. for sleep, Disp: 90 tablet,  Rfl: 1  EXAM:  VITALS per patient if applicable:  GENERAL: alert, oriented, appears well and in no acute distress  HEENT: atraumatic, conjunttiva clear, no obvious abnormalities on inspection of external nose and ears  NECK: normal movements of the head and neck  LUNGS: on inspection no signs of respiratory distress, breathing rate appears normal, no obvious gross SOB, gasping or wheezing  CV: no obvious cyanosis  MS: moves all visible extremities without noticeable abnormality  PSYCH/NEURO: pleasant and cooperative, no obvious depression or  anxiety, speech and thought processing grossly intact  ASSESSMENT AND PLAN:  Discussed the following assessment and plan:  Patient has symptom complex including sore throat, fatigue, diarrhea, chills, mild cough, headache suspicious for COVID-19 infection  -Testing later today already scheduled -Strict quarantine until further clarified -Plenty of fluids and rest -Suggested she try to get pulse oximeter to monitor her oxygen levels -She might be a candidate for monoclonal antibody infusion because of her multiple risk factors if she proves to be positive     I discussed the assessment and treatment plan with the patient. The patient was provided an opportunity to ask questions and all were answered. The patient agreed with the plan and demonstrated an understanding of the instructions.   The patient was advised to call back or seek an in-person evaluation if the symptoms worsen or if the condition fails to improve as anticipated.     Carolann Littler, MD

## 2019-04-11 NOTE — Telephone Encounter (Signed)
Patient called needing to reschedule her surgery for 04/25/19. She has been rescheduled till 06/20/2019. Caren Griffins at [facility] has been notified.

## 2019-04-13 ENCOUNTER — Other Ambulatory Visit: Payer: Self-pay

## 2019-04-13 ENCOUNTER — Encounter (INDEPENDENT_AMBULATORY_CARE_PROVIDER_SITE_OTHER): Payer: Self-pay | Admitting: Otolaryngology

## 2019-04-13 ENCOUNTER — Ambulatory Visit (INDEPENDENT_AMBULATORY_CARE_PROVIDER_SITE_OTHER): Payer: Medicare Other | Admitting: Otolaryngology

## 2019-04-13 VITALS — Temp 97.7°F

## 2019-04-13 DIAGNOSIS — M542 Cervicalgia: Secondary | ICD-10-CM | POA: Diagnosis not present

## 2019-04-13 DIAGNOSIS — M7742 Metatarsalgia, left foot: Secondary | ICD-10-CM | POA: Diagnosis not present

## 2019-04-13 DIAGNOSIS — G9001 Carotid sinus syncope: Secondary | ICD-10-CM | POA: Diagnosis not present

## 2019-04-13 NOTE — Progress Notes (Signed)
HPI: [name] is a 64 y.o. female who presents for evaluation of chronic left neck pain she has had for about 4 weeks now that is constantly got little bit worse.  She describes pain along the left neck.  She gets a little shooting pain when she eats and gets worse when she talks.  It has been persistent and is gradually gotten worse.  She has tried Zyrtec as well as nasal sprays with no relief.  She had a Covid test that was negative and she was on 1 round of antibiotics without improvement.  Past Medical History:  Diagnosis Date  . ANGIOMA 10/03/2008   REMOVED FROM RIGHT LOWER LEG-BENIGN  . Anxiety   . Arthritis    RA  . Bronchitis    hx of   . Cancer (HCC)    BASAL CELL SKIN CANCER  . Complication of anesthesia   . Fibromyalgia   . GERD 08/27/2008  . History of blood transfusion    1995  . History of hiatal hernia    hx of has had surgically repaired  . History of measles   . History of mumps   . History of nonmelanoma skin cancer   . HYPERLIPIDEMIA 08/27/2008  . HYPERTENSION 08/27/2008   pt is currently on no meds   . Pneumonia    hx of   . PONV (postoperative nausea and vomiting)   . Rheumatoid arthritis ([place])   . SEBORRHEIC KERATOSIS 08/27/2008   Past Surgical History:  Procedure Laterality Date  . ABDOMINAL HYSTERECTOMY  1995  . APPENDECTOMY    . BREAST BIOPSY Left   . BREAST DUCTAL SYSTEM EXCISION  08/16/2011   Procedure: EXCISION DUCTAL SYSTEM BREAST;  Surgeon: Joyice Faster. Cornett, MD;  Location: [facility];  Service: General;  Laterality: Left;  left breast duct excision  . [place]   times 2; 1980 and 1982  . [place]  . CHOLECYSTECTOMY  1997  . EXCISION/RELEASE BURSA HIP Right 10/21/2014   Procedure: OPEN RIGHT HIP BURSECTOMY, EXOSECTOMY;  Surgeon: Paralee Cancel, MD;  [facility];  Service: Orthopedics;  Laterality: Right;  . foot surgery  2004, 2005, 2008   bilat secondary to neuropathy   . JOINT REPLACEMENT   10,12   lt total knee X2  . KNEE ARTHROSCOPY  2007, 2008, 2009  . LAPAROSCOPIC GASTRIC SLEEVE RESECTION N/A 10/30/2013   Procedure: LAPAROSCOPIC GASTRIC SLEEVE RESECTION WITH HIATAL HERNIA REPAIR AND UPPER ENDOSCOPY;  Surgeon: Greer Pickerel, MD;  [facility];  Service: General;  Laterality: N/A;  . NASAL SINUS SURGERY  2013  . OPEN SURGICAL REPAIR OF GLUTEAL TENDON  01/17/2012   Procedure: OPEN SURGICAL REPAIR OF GLUTEAL TENDON;  Surgeon: Mauri Pole, MD;  [facility];  Service: Orthopedics;  Laterality: Right;   Social History   Socioeconomic History  . Marital status: Married    Spouse name: Not on file  . Number of children: 2  . Years of education: Not on file  . Highest education level: High school graduate  Occupational History  . Occupation: retired    Comment: Building services engineer  Tobacco Use  . Smoking status: Former Smoker    Packs/day: 0.25    Years: 4.00    Pack years: 1.00    Types: Cigarettes    Quit date: 02/23/1984    Years since quitting: 35.1  . Smokeless tobacco: Never Used  Substance and Sexual Activity  . Alcohol use: No  .  Drug use: No  . Sexual activity: Not on file  Other Topics Concern  . Not on file  Social History Narrative   Lives at home with husband   Disabled   Right handed   Caffeine: 1 cup of coffee daily   Social Determinants of Health   Financial Resource Strain:   . Difficulty of Paying Living Expenses: Not on file  Food Insecurity:   . Worried About Charity fundraiser in the Last Year: Not on file  . Ran Out of Food in the Last Year: Not on file  Transportation Needs:   . Lack of Transportation (Medical): Not on file  . Lack of Transportation (Non-Medical): Not on file  Physical Activity:   . Days of Exercise per Week: Not on file  . Minutes of Exercise per Session: Not on file  Stress:   . Feeling of Stress : Not on file  Social Connections:   . Frequency of Communication with Friends and Family: Not on file  .  Frequency of Social Gatherings with Friends and Family: Not on file  . Attends Religious Services: Not on file  . Active Member of Clubs or Organizations: Not on file  . Attends Archivist Meetings: Not on file  . Marital Status: Not on file   Family History  Problem Relation Age of Onset  . Cancer Mother        breast  . Breast cancer Mother 13  . Kidney disease Father   . Epilepsy Father   . Heart attack Sister   . Heart attack Sister   . Diabetes Son    Allergies  Allergen Reactions  . Bee Venom Anaphylaxis  . Codeine Sulfate Other (See Comments)    GI upset  . Penicillins Rash   Prior to Admission medications   Medication Sig Start Date End Date Taking? Authorizing Provider  acetaminophen (TYLENOL) 500 MG tablet Take 1,000 mg by mouth every 6 (six) hours as needed for pain.    Yes [provider]  Biotin 1000 MCG tablet Take 3,000 mcg by mouth daily.   Yes [provider]  EPINEPHrine (EPIPEN 2-PAK) 0.3 mg/0.3 mL IJ SOAJ injection Inject 0.3 mLs (0.3 mg total) into the muscle once. 10/16/18  Yes Burchette, Alinda Sierras, MD  estradiol (ESTRACE) 1 MG tablet Take 1 tablet by mouth once daily 03/01/19  Yes Burchette, Alinda Sierras, MD  etanercept (ENBREL) 50 MG/ML injection Inject 0.98 mLs (50 mg total) into the skin once a week. Resume end of next week Patient taking differently: Inject 50 mg into the skin once a week. Friday 11/01/13  Yes Greer Pickerel, MD  folic acid (FOLVITE) 1 MG tablet Take 2 mg by mouth daily.    Yes [provider]  lisinopril (ZESTRIL) 10 MG tablet Take 1 tablet (10 mg total) by mouth daily. 08/09/18  Yes Burchette, Alinda Sierras, MD  methotrexate (50 MG/ML) 1 gm SOLR May resume previous dose at  end of next week Patient taking differently: Inject 50 mg into the skin once a week. Friday 11/01/13  Yes Greer Pickerel, MD  ondansetron (ZOFRAN ODT) 4 MG disintegrating tablet 4mg  ODT q4 hours prn nausea/vomit 12/24/16  Yes Deno Etienne, DO   oxyCODONE-acetaminophen (PERCOCET/ROXICET) 5-325 MG tablet TAKE 1 TABLET AS NEEDED EVERY 8 HOURS FOR PAIN 12/13/16  Yes [provider]  predniSONE (DELTASONE) 5 MG tablet Take 5 mg by mouth daily with breakfast.   Yes [provider]  scopolamine (TRANSDERM-SCOP) 1  MG/3DAYS Place 1 patch (1.5 mg total) onto the skin every 3 (three) days. Behind ear 12/25/14  Yes Burchette, Alinda Sierras, MD  sertraline (ZOLOFT) 50 MG tablet TAKE 1 & 1/2 (ONE & ONE-HALF) TABLETS BY MOUTH ONCE DAILY 05/19/18  Yes Burchette, Alinda Sierras, MD  zolpidem (AMBIEN) 10 MG tablet Take 1 tablet (10 mg total) by mouth at bedtime as needed. for sleep 11/17/18  Yes Burchette, Alinda Sierras, MD  benzonatate (TESSALON) 100 MG capsule Take 1 capsule (100 mg total) by mouth 3 (three) times daily as needed for cough. 02/05/19   Burchette, Alinda Sierras, MD  cyclobenzaprine (FLEXERIL) 5 MG tablet Take 5 mg by mouth 2 (two) times daily. 08/21/18   [provider]  doxycycline (VIBRAMYCIN) 100 MG capsule Take 1 capsule (100 mg total) by mouth 2 (two) times daily. 02/05/19   Burchette, Alinda Sierras, MD  hydroxychloroquine (PLAQUENIL) 200 MG tablet TAKE 1 TABLET BY MOUTH ONCE DAILY WITH FOOD OR MILK FOR 90 DAYS 07/19/18   [provider]     Positive ROS: Otherwise negative  All other systems have been reviewed and were otherwise negative with the exception of those mentioned in the HPI and as above.  Physical Exam: Constitutional: Alert, well-appearing, no acute distress Ears: External ears without lesions or tenderness. Ear canals are clear bilaterally with intact, clear TMs.  Nasal: External nose without lesions. Septum relatively midline with mild rhinitis..  Middle meatus regions are clear with no signs of infection. Oral: Lips and gums without lesions. Tongue and palate mucosa without lesions. Posterior oropharynx clear.  Tonsil regions appear benign bilaterally.  Indirect laryngoscopy revealed a clear base of tongue  epiglottis and hypopharynx. Fiberoptic laryngoscopy through the right nostril reveals a clear nasopharynx.  Base of tongue vallecula and epiglottis were normal.  Vocal cords were clear and normal bilaterally with normal mobility.  Piriform sinuses were clear.  I passed the laryngoscope through the upper esophageal sphincter without difficulty and the upper cervical esophagus was clear. Neck: No palpable adenopathy or masses.  On palpation of the neck she had pain to palpation of the left carotid artery and no pain or discomfort on the right side.  Findings are consistent with carotidynia. Respiratory: Breathing comfortably  Skin: No facial/neck lesions or rash noted.  Laryngoscopy  Date/Time: 04/13/2019 4:49 PM Performed by: Rozetta Nunnery, MD Authorized by: Rozetta Nunnery, MD   Consent:    Consent obtained:  Verbal   Consent given by:  Patient   Risks discussed:  Pain Procedure details:    Indications: direct visualization of the upper aerodigestive tract     Medication:  Afrin   Instrument: flexible fiberoptic laryngoscope     Scope location: right nare   Mouth:    Oropharynx: normal     Vallecula: normal     Epiglottis: normal   Throat:    True vocal cords: normal   Comments:     Clear base of tongue, epiglottis, hypopharynx and larynx on fiberoptic laryngoscopy.  Passed through the upper esophageal sphincter without difficulty with clear upper cervical esophagus.    Assessment: Left carotidynia  Plan: Recommended ibuprofen 600 mg 3 times daily for 4 days followed by 400 mg 3 times daily for 6 days. She will call us back in 10 days concerning results of treatment and if she is having any further pain or discomfort.  Radene Journey, MD

## 2019-04-24 ENCOUNTER — Telehealth: Payer: Self-pay | Admitting: Family Medicine

## 2019-04-24 NOTE — Telephone Encounter (Signed)
Dr. Lucia Gaskins would like a call back from Dr. Elease Hashimoto concerning patient.

## 2019-04-24 NOTE — Telephone Encounter (Signed)
Katharine Look with Dr. Mickie Hillier office is calling need for a return call today concerning the pt.  [phone]

## 2019-04-26 NOTE — Telephone Encounter (Signed)
I called and left message to call back but have not heard anything

## 2019-04-27 NOTE — Telephone Encounter (Signed)
Just sending back to see if you have spoken with Dr. Lucia Gaskins?

## 2019-04-27 NOTE — Telephone Encounter (Signed)
I did speak with Dr Lucia Gaskins   he could not find cause of neck pain.   Set her up for follow up next week.  We may need to get neck imaging.

## 2019-04-27 NOTE — Telephone Encounter (Signed)
Message routed to [facility]

## 2019-04-30 ENCOUNTER — Encounter: Payer: Medicare Other | Admitting: Podiatry

## 2019-04-30 NOTE — Telephone Encounter (Signed)
Called patient and LMOVM to return call  Left a detailed voice message for patient to call us back to schedule an in office appointment with Dr. Elease Hashimoto.

## 2019-05-02 ENCOUNTER — Telehealth (INDEPENDENT_AMBULATORY_CARE_PROVIDER_SITE_OTHER): Payer: Medicare Other | Admitting: Family Medicine

## 2019-05-02 ENCOUNTER — Other Ambulatory Visit: Payer: Self-pay

## 2019-05-02 DIAGNOSIS — M542 Cervicalgia: Secondary | ICD-10-CM

## 2019-05-02 NOTE — Telephone Encounter (Signed)
Patient scheduled for 05/02/19 for follow-up.

## 2019-05-02 NOTE — Progress Notes (Signed)
This visit type was conducted due to national recommendations for restrictions regarding the COVID-19 pandemic in an effort to limit this patient's exposure and mitigate transmission in our community.   Virtual Visit via Video Note  I connected with [name] on 05/02/19 at  2:00 PM EST by a video enabled telemedicine application and verified that I am speaking with the correct person using two identifiers.  Location patient: home Location provider:work or home office Persons participating in the virtual visit: patient, provider  I discussed the limitations of evaluation and management by telemedicine and the availability of in person appointments. The patient expressed understanding and agreed to proceed.   HPI: [name] has had several weeks of some anterior neck pain.  Started on the left side but now bilateral.  She went to ENT.  She had nasolaryngoscopy which was unremarkable.  No adenopathy.  She has some pain with the motion of swallowing and also somewhat speaking.  She has had some intermittent hoarseness.  No dysphagia.  No active reflux symptoms.  She has not noted any cervical adenopathy.  She has been taking occasional ibuprofen and Tylenol which seems to help.  She had gastric sleeve surgery and knows that she needs to avoid regular nonsteroidals she has not palpated any masses or nodules  She does have rheumatoid arthritis and has follow-up with rheumatologist in April.  She does not describe any neck pain with significant movement.  No radiculitis symptoms.  Past Medical History:  Diagnosis Date  . ANGIOMA 10/03/2008   REMOVED FROM RIGHT LOWER LEG-BENIGN  . Anxiety   . Arthritis    RA  . Bronchitis    hx of   . Cancer (HCC)    BASAL CELL SKIN CANCER  . Complication of anesthesia   . Fibromyalgia   . GERD 08/27/2008  . History of blood transfusion    1995  . History of hiatal hernia    hx of has had surgically repaired  . History of measles   . History of mumps   .  History of nonmelanoma skin cancer   . HYPERLIPIDEMIA 08/27/2008  . HYPERTENSION 08/27/2008   pt is currently on no meds   . Pneumonia    hx of   . PONV (postoperative nausea and vomiting)   . Rheumatoid arthritis ([place])   . SEBORRHEIC KERATOSIS 08/27/2008   Past Surgical History:  Procedure Laterality Date  . ABDOMINAL HYSTERECTOMY  1995  . APPENDECTOMY    . BREAST BIOPSY Left   . BREAST DUCTAL SYSTEM EXCISION  08/16/2011   Procedure: EXCISION DUCTAL SYSTEM BREAST;  Surgeon: Joyice Faster. Cornett, MD;  Location: [facility];  Service: General;  Laterality: Left;  left breast duct excision  . [place]   times 2; 1980 and 1982  . [place]  . CHOLECYSTECTOMY  1997  . EXCISION/RELEASE BURSA HIP Right 10/21/2014   Procedure: OPEN RIGHT HIP BURSECTOMY, EXOSECTOMY;  Surgeon: Paralee Cancel, MD;  [facility];  Service: Orthopedics;  Laterality: Right;  . foot surgery  2004, 2005, 2008   bilat secondary to neuropathy   . JOINT REPLACEMENT  10,12   lt total knee X2  . KNEE ARTHROSCOPY  2007, 2008, 2009  . LAPAROSCOPIC GASTRIC SLEEVE RESECTION N/A 10/30/2013   Procedure: LAPAROSCOPIC GASTRIC SLEEVE RESECTION WITH HIATAL HERNIA REPAIR AND UPPER ENDOSCOPY;  Surgeon: Greer Pickerel, MD;  [facility];  Service: General;  Laterality: N/A;  . NASAL SINUS SURGERY  2013  .  OPEN SURGICAL REPAIR OF GLUTEAL TENDON  01/17/2012   Procedure: OPEN SURGICAL REPAIR OF GLUTEAL TENDON;  Surgeon: Mauri Pole, MD;  [facility];  Service: Orthopedics;  Laterality: Right;    reports that she quit smoking about 35 years ago. Her smoking use included cigarettes. She has a 1.00 pack-year smoking history. She has never used smokeless tobacco. She reports that she does not drink alcohol or use drugs. family history includes Breast cancer (age of onset: 30) in her mother; Cancer in her mother; Diabetes in her son; Epilepsy in her father; Heart attack in her sister and sister;  Kidney disease in her father. Allergies  Allergen Reactions  . Bee Venom Anaphylaxis  . Codeine Sulfate Other (See Comments)    GI upset  . Penicillins Rash      ROS: See pertinent positives and negatives per HPI.  Past Medical History:  Diagnosis Date  . ANGIOMA 10/03/2008   REMOVED FROM RIGHT LOWER LEG-BENIGN  . Anxiety   . Arthritis    RA  . Bronchitis    hx of   . Cancer (HCC)    BASAL CELL SKIN CANCER  . Complication of anesthesia   . Fibromyalgia   . GERD 08/27/2008  . History of blood transfusion    1995  . History of hiatal hernia    hx of has had surgically repaired  . History of measles   . History of mumps   . History of nonmelanoma skin cancer   . HYPERLIPIDEMIA 08/27/2008  . HYPERTENSION 08/27/2008   pt is currently on no meds   . Pneumonia    hx of   . PONV (postoperative nausea and vomiting)   . Rheumatoid arthritis ([place])   . SEBORRHEIC KERATOSIS 08/27/2008    Past Surgical History:  Procedure Laterality Date  . ABDOMINAL HYSTERECTOMY  1995  . APPENDECTOMY    . BREAST BIOPSY Left   . BREAST DUCTAL SYSTEM EXCISION  08/16/2011   Procedure: EXCISION DUCTAL SYSTEM BREAST;  Surgeon: Joyice Faster. Cornett, MD;  Location: [facility];  Service: General;  Laterality: Left;  left breast duct excision  . [place]   times 2; 1980 and 1982  . [place]  . CHOLECYSTECTOMY  1997  . EXCISION/RELEASE BURSA HIP Right 10/21/2014   Procedure: OPEN RIGHT HIP BURSECTOMY, EXOSECTOMY;  Surgeon: Paralee Cancel, MD;  [facility];  Service: Orthopedics;  Laterality: Right;  . foot surgery  2004, 2005, 2008   bilat secondary to neuropathy   . JOINT REPLACEMENT  10,12   lt total knee X2  . KNEE ARTHROSCOPY  2007, 2008, 2009  . LAPAROSCOPIC GASTRIC SLEEVE RESECTION N/A 10/30/2013   Procedure: LAPAROSCOPIC GASTRIC SLEEVE RESECTION WITH HIATAL HERNIA REPAIR AND UPPER ENDOSCOPY;  Surgeon: Greer Pickerel, MD;  [facility];  Service: General;   Laterality: N/A;  . NASAL SINUS SURGERY  2013  . OPEN SURGICAL REPAIR OF GLUTEAL TENDON  01/17/2012   Procedure: OPEN SURGICAL REPAIR OF GLUTEAL TENDON;  Surgeon: Mauri Pole, MD;  [facility];  Service: Orthopedics;  Laterality: Right;    Family History  Problem Relation Age of Onset  . Cancer Mother        breast  . Breast cancer Mother 41  . Kidney disease Father   . Epilepsy Father   . Heart attack Sister   . Heart attack Sister   . Diabetes Son     SOCIAL HX: Non-smoker  Current Outpatient Medications:  .  acetaminophen (TYLENOL) 500 MG tablet, Take 1,000 mg by mouth every 6 (six) hours as needed for pain. , Disp: , Rfl:  .  benzonatate (TESSALON) 100 MG capsule, Take 1 capsule (100 mg total) by mouth 3 (three) times daily as needed for cough., Disp: 30 capsule, Rfl: 0 .  Biotin 1000 MCG tablet, Take 3,000 mcg by mouth daily., Disp: , Rfl:  .  cyclobenzaprine (FLEXERIL) 5 MG tablet, Take 5 mg by mouth 2 (two) times daily., Disp: , Rfl:  .  doxycycline (VIBRAMYCIN) 100 MG capsule, Take 1 capsule (100 mg total) by mouth 2 (two) times daily., Disp: 20 capsule, Rfl: 0 .  EPINEPHrine (EPIPEN 2-PAK) 0.3 mg/0.3 mL IJ SOAJ injection, Inject 0.3 mLs (0.3 mg total) into the muscle once., Disp: 2 each, Rfl: 1 .  estradiol (ESTRACE) 1 MG tablet, Take 1 tablet by mouth once daily, Disp: 90 tablet, Rfl: 0 .  etanercept (ENBREL) 50 MG/ML injection, Inject 0.98 mLs (50 mg total) into the skin once a week. Resume end of next week (Patient taking differently: Inject 50 mg into the skin once a week. Friday), Disp: 0.98 mL, Rfl: 0 .  folic acid (FOLVITE) 1 MG tablet, Take 2 mg by mouth daily. , Disp: , Rfl:  .  hydroxychloroquine (PLAQUENIL) 200 MG tablet, TAKE 1 TABLET BY MOUTH ONCE DAILY WITH FOOD OR MILK FOR 90 DAYS, Disp: , Rfl:  .  lisinopril (ZESTRIL) 10 MG tablet, Take 1 tablet (10 mg total) by mouth daily., Disp: 90 tablet, Rfl: 3 .  methotrexate (50 MG/ML) 1 gm SOLR, May resume  previous dose at  end of next week (Patient taking differently: Inject 50 mg into the skin once a week. Friday), Disp: , Rfl:  .  ondansetron (ZOFRAN ODT) 4 MG disintegrating tablet, 4mg  ODT q4 hours prn nausea/vomit, Disp: 20 tablet, Rfl: 0 .  oxyCODONE-acetaminophen (PERCOCET/ROXICET) 5-325 MG tablet, TAKE 1 TABLET AS NEEDED EVERY 8 HOURS FOR PAIN, Disp: , Rfl: 0 .  predniSONE (DELTASONE) 5 MG tablet, Take 5 mg by mouth daily with breakfast., Disp: , Rfl:  .  scopolamine (TRANSDERM-SCOP) 1 MG/3DAYS, Place 1 patch (1.5 mg total) onto the skin every 3 (three) days. Behind ear, Disp: 10 patch, Rfl: 0 .  sertraline (ZOLOFT) 50 MG tablet, TAKE 1 & 1/2 (ONE & ONE-HALF) TABLETS BY MOUTH ONCE DAILY, Disp: 135 tablet, Rfl: 0 .  zolpidem (AMBIEN) 10 MG tablet, Take 1 tablet (10 mg total) by mouth at bedtime as needed. for sleep, Disp: 90 tablet, Rfl: 1  EXAM:  VITALS per patient if applicable:  GENERAL: alert, oriented, appears well and in no acute distress  HEENT: atraumatic, conjunttiva clear, no obvious abnormalities on inspection of external nose and ears  NECK: normal movements of the head and neck  LUNGS: on inspection no signs of respiratory distress, breathing rate appears normal, no obvious gross SOB, gasping or wheezing  CV: no obvious cyanosis  MS: moves all visible extremities without noticeable abnormality  PSYCH/NEURO: pleasant and cooperative, no obvious depression or anxiety, speech and thought processing grossly intact  ASSESSMENT AND PLAN:  Discussed the following assessment and plan:  Anterior neck pain of approximately 4 weeks duration.  She saw ENT as above and exam unrevealing.  She is not describing any adenopathy.  No neck nodules.  Does have rheumatoid arthritis but is not describing any pain around her cervical spine region.  Pain is improved with ibuprofen which suggest possible musculoskeletal  origin  -Recommend observation for now.  We offered cervical spine films  but she would like to wait and follow-up with her rheumatologist first     I discussed the assessment and treatment plan with the patient. The patient was provided an opportunity to ask questions and all were answered. The patient agreed with the plan and demonstrated an understanding of the instructions.   The patient was advised to call back or seek an in-person evaluation if the symptoms worsen or if the condition fails to improve as anticipated.     Carolann Littler, MD

## 2019-05-08 DIAGNOSIS — M542 Cervicalgia: Secondary | ICD-10-CM | POA: Diagnosis not present

## 2019-05-08 DIAGNOSIS — M797 Fibromyalgia: Secondary | ICD-10-CM | POA: Diagnosis not present

## 2019-05-08 DIAGNOSIS — M150 Primary generalized (osteo)arthritis: Secondary | ICD-10-CM | POA: Diagnosis not present

## 2019-05-08 DIAGNOSIS — M0589 Other rheumatoid arthritis with rheumatoid factor of multiple sites: Secondary | ICD-10-CM | POA: Diagnosis not present

## 2019-05-08 DIAGNOSIS — M25512 Pain in left shoulder: Secondary | ICD-10-CM | POA: Diagnosis not present

## 2019-05-10 ENCOUNTER — Encounter: Payer: Medicare Other | Admitting: Podiatry

## 2019-05-10 ENCOUNTER — Encounter: Payer: Self-pay | Admitting: Family Medicine

## 2019-05-10 DIAGNOSIS — M542 Cervicalgia: Secondary | ICD-10-CM

## 2019-05-11 DIAGNOSIS — M7742 Metatarsalgia, left foot: Secondary | ICD-10-CM | POA: Diagnosis not present

## 2019-05-14 ENCOUNTER — Encounter: Payer: Self-pay | Admitting: Family Medicine

## 2019-05-14 DIAGNOSIS — Z23 Encounter for immunization: Secondary | ICD-10-CM | POA: Diagnosis not present

## 2019-05-14 NOTE — Telephone Encounter (Signed)
I ordered Cervical spine x-rays and she will need to schedule for here.

## 2019-05-15 ENCOUNTER — Other Ambulatory Visit: Payer: Self-pay

## 2019-05-16 ENCOUNTER — Other Ambulatory Visit: Payer: Medicare Other

## 2019-05-16 ENCOUNTER — Ambulatory Visit (INDEPENDENT_AMBULATORY_CARE_PROVIDER_SITE_OTHER): Payer: Medicare Other

## 2019-05-16 DIAGNOSIS — M542 Cervicalgia: Secondary | ICD-10-CM

## 2019-05-18 ENCOUNTER — Encounter: Payer: Self-pay | Admitting: Family Medicine

## 2019-05-18 DIAGNOSIS — M7541 Impingement syndrome of right shoulder: Secondary | ICD-10-CM | POA: Diagnosis not present

## 2019-05-18 DIAGNOSIS — M25511 Pain in right shoulder: Secondary | ICD-10-CM | POA: Diagnosis not present

## 2019-05-20 ENCOUNTER — Other Ambulatory Visit: Payer: Self-pay | Admitting: Family Medicine

## 2019-05-20 ENCOUNTER — Encounter: Payer: Self-pay | Admitting: Family Medicine

## 2019-05-21 NOTE — Telephone Encounter (Signed)
Last ov:04/11/19 Last filled:11/17/2018

## 2019-05-24 ENCOUNTER — Encounter: Payer: Medicare Other | Admitting: Podiatry

## 2019-05-24 DIAGNOSIS — Z85828 Personal history of other malignant neoplasm of skin: Secondary | ICD-10-CM | POA: Diagnosis not present

## 2019-05-24 DIAGNOSIS — D485 Neoplasm of uncertain behavior of skin: Secondary | ICD-10-CM | POA: Diagnosis not present

## 2019-05-24 DIAGNOSIS — D225 Melanocytic nevi of trunk: Secondary | ICD-10-CM | POA: Diagnosis not present

## 2019-05-24 DIAGNOSIS — D2372 Other benign neoplasm of skin of left lower limb, including hip: Secondary | ICD-10-CM | POA: Diagnosis not present

## 2019-05-24 DIAGNOSIS — L570 Actinic keratosis: Secondary | ICD-10-CM | POA: Diagnosis not present

## 2019-05-30 DIAGNOSIS — M25511 Pain in right shoulder: Secondary | ICD-10-CM | POA: Diagnosis not present

## 2019-05-31 ENCOUNTER — Telehealth: Payer: Self-pay

## 2019-05-31 NOTE — Telephone Encounter (Signed)
DOS 06/20/19  HALLUX MPJ FUSION LT [id number]  [employer] MEDICARE EFFECTIVE DATE - 02/23/19  PLAN DEDUCTIBLE - $0.00 OUT OF POCKET - $3600 W/ $3474.41 REMAINING  COPAY $295 / Day OUTPATIENT SURGERY $295 / [place] 0% / Day OUTPATIENT SURGERY 0% / [place]  Notification or Prior Authorization is not required for the requested services  This [employer] Medicare Advantage members plan does not currently require a prior authorization for these services. If you have general questions about the prior authorization requirements, please call us at [phone] or visit [email or web address] > Clinician Resources > Advance and Admission Notification Requirements. The number above acknowledges your notification. Please write this number down for future reference. Notification is not a guarantee of coverage or payment.  Decision ID [id number]

## 2019-06-06 DIAGNOSIS — M542 Cervicalgia: Secondary | ICD-10-CM | POA: Diagnosis not present

## 2019-06-06 DIAGNOSIS — M0589 Other rheumatoid arthritis with rheumatoid factor of multiple sites: Secondary | ICD-10-CM | POA: Diagnosis not present

## 2019-06-06 DIAGNOSIS — M797 Fibromyalgia: Secondary | ICD-10-CM | POA: Diagnosis not present

## 2019-06-08 DIAGNOSIS — M7541 Impingement syndrome of right shoulder: Secondary | ICD-10-CM | POA: Diagnosis not present

## 2019-06-08 DIAGNOSIS — M25511 Pain in right shoulder: Secondary | ICD-10-CM | POA: Diagnosis not present

## 2019-06-11 DIAGNOSIS — M7742 Metatarsalgia, left foot: Secondary | ICD-10-CM | POA: Diagnosis not present

## 2019-06-11 DIAGNOSIS — Z23 Encounter for immunization: Secondary | ICD-10-CM | POA: Diagnosis not present

## 2019-06-12 ENCOUNTER — Telehealth: Payer: Self-pay

## 2019-06-12 NOTE — Telephone Encounter (Signed)
Patients husband called to cancel her surgery on 06/20/19. He stated she needs to have shoulder surgery first then once she if heeled they will proceed with foot surgery. Notified Caren Griffins at [facility]

## 2019-06-25 ENCOUNTER — Encounter: Payer: Medicare Other | Admitting: Podiatry

## 2019-07-05 ENCOUNTER — Encounter: Payer: Medicare Other | Admitting: Podiatry

## 2019-07-10 DIAGNOSIS — K5904 Chronic idiopathic constipation: Secondary | ICD-10-CM | POA: Diagnosis not present

## 2019-07-10 DIAGNOSIS — Z800 Family history of malignant neoplasm of digestive organs: Secondary | ICD-10-CM | POA: Diagnosis not present

## 2019-07-10 DIAGNOSIS — K219 Gastro-esophageal reflux disease without esophagitis: Secondary | ICD-10-CM | POA: Diagnosis not present

## 2019-07-11 DIAGNOSIS — Z4889 Encounter for other specified surgical aftercare: Secondary | ICD-10-CM | POA: Diagnosis not present

## 2019-07-11 DIAGNOSIS — M7742 Metatarsalgia, left foot: Secondary | ICD-10-CM | POA: Diagnosis not present

## 2019-07-11 DIAGNOSIS — I890 Lymphedema, not elsewhere classified: Secondary | ICD-10-CM | POA: Diagnosis not present

## 2019-07-13 DIAGNOSIS — M19011 Primary osteoarthritis, right shoulder: Secondary | ICD-10-CM | POA: Diagnosis not present

## 2019-07-13 DIAGNOSIS — S43431A Superior glenoid labrum lesion of right shoulder, initial encounter: Secondary | ICD-10-CM | POA: Diagnosis not present

## 2019-07-13 DIAGNOSIS — M7541 Impingement syndrome of right shoulder: Secondary | ICD-10-CM | POA: Diagnosis not present

## 2019-07-13 DIAGNOSIS — G8918 Other acute postprocedural pain: Secondary | ICD-10-CM | POA: Diagnosis not present

## 2019-07-13 DIAGNOSIS — M75111 Incomplete rotator cuff tear or rupture of right shoulder, not specified as traumatic: Secondary | ICD-10-CM | POA: Diagnosis not present

## 2019-07-13 DIAGNOSIS — M65811 Other synovitis and tenosynovitis, right shoulder: Secondary | ICD-10-CM | POA: Diagnosis not present

## 2019-07-13 DIAGNOSIS — M24111 Other articular cartilage disorders, right shoulder: Secondary | ICD-10-CM | POA: Diagnosis not present

## 2019-07-14 DIAGNOSIS — Z4889 Encounter for other specified surgical aftercare: Secondary | ICD-10-CM | POA: Diagnosis not present

## 2019-07-14 DIAGNOSIS — I890 Lymphedema, not elsewhere classified: Secondary | ICD-10-CM | POA: Diagnosis not present

## 2019-07-14 DIAGNOSIS — M25511 Pain in right shoulder: Secondary | ICD-10-CM | POA: Diagnosis not present

## 2019-07-16 ENCOUNTER — Other Ambulatory Visit: Payer: Self-pay | Admitting: Family Medicine

## 2019-07-20 ENCOUNTER — Ambulatory Visit: Payer: Medicare Other | Attending: Orthopedic Surgery | Admitting: Physical Therapy

## 2019-07-20 ENCOUNTER — Other Ambulatory Visit: Payer: Self-pay

## 2019-07-20 ENCOUNTER — Encounter: Payer: Self-pay | Admitting: Physical Therapy

## 2019-07-20 DIAGNOSIS — M25611 Stiffness of right shoulder, not elsewhere classified: Secondary | ICD-10-CM

## 2019-07-20 DIAGNOSIS — M6281 Muscle weakness (generalized): Secondary | ICD-10-CM

## 2019-07-20 DIAGNOSIS — M25511 Pain in right shoulder: Secondary | ICD-10-CM | POA: Diagnosis not present

## 2019-07-20 NOTE — Therapy (Signed)
[facility] [place], [place], [address] Phone: [phone]   Fax:  [phone]  Physical Therapy Evaluation  Patient Details  Name: [name] MRN: [id number] Date of Birth: [birth year] Referring Provider (PT): Justice Britain, MD   Encounter Date: 07/20/2019  PT End of Session - 07/20/19 1314    Visit Number  1    Number of Visits  4    Date for PT Re-Evaluation  08/24/19    Authorization Type  FOTO (Initial: 77% limitation), progress note every 10th visit, KX modifier after 15th visit    PT Start Time  0933    PT Stop Time  1014    PT Time Calculation (min)  41 min    Equipment Utilized During Treatment  Other (comment)   Right sling with abduction pillow   Activity Tolerance  Patient tolerated treatment well    Behavior During Therapy  [facility] for tasks assessed/performed       Past Medical History:  Diagnosis Date  . ANGIOMA 10/03/2008   REMOVED FROM RIGHT LOWER LEG-BENIGN  . Anxiety   . Arthritis    RA  . Bronchitis    hx of   . Cancer (HCC)    BASAL CELL SKIN CANCER  . Complication of anesthesia   . Fibromyalgia   . GERD 08/27/2008  . History of blood transfusion    1995  . History of hiatal hernia    hx of has had surgically repaired  . History of measles   . History of mumps   . History of nonmelanoma skin cancer   . HYPERLIPIDEMIA 08/27/2008  . HYPERTENSION 08/27/2008   pt is currently on no meds   . Pneumonia    hx of   . PONV (postoperative nausea and vomiting)   . Rheumatoid arthritis ([place])   . SEBORRHEIC KERATOSIS 08/27/2008    Past Surgical History:  Procedure Laterality Date  . ABDOMINAL HYSTERECTOMY  1995  . APPENDECTOMY    . BREAST BIOPSY Left   . BREAST DUCTAL SYSTEM EXCISION  08/16/2011   Procedure: EXCISION DUCTAL SYSTEM BREAST;  Surgeon: Joyice Faster. Cornett, MD;  Location: [facility];  Service: General;  Laterality: Left;  left breast duct excision  . [place]   times 2; 1980 and 1982  . [place]  . CHOLECYSTECTOMY  1997  . EXCISION/RELEASE BURSA HIP Right 10/21/2014   Procedure: OPEN RIGHT HIP BURSECTOMY, EXOSECTOMY;  Surgeon: Paralee Cancel, MD;  [facility];  Service: Orthopedics;  Laterality: Right;  . foot surgery  2004, 2005, 2008   bilat secondary to neuropathy   . JOINT REPLACEMENT  10,12   lt total knee X2  . KNEE ARTHROSCOPY  2007, 2008, 2009  . LAPAROSCOPIC GASTRIC SLEEVE RESECTION N/A 10/30/2013   Procedure: LAPAROSCOPIC GASTRIC SLEEVE RESECTION WITH HIATAL HERNIA REPAIR AND UPPER ENDOSCOPY;  Surgeon: Greer Pickerel, MD;  [facility];  Service: General;  Laterality: N/A;  . NASAL SINUS SURGERY  2013  . OPEN SURGICAL REPAIR OF GLUTEAL TENDON  01/17/2012   Procedure: OPEN SURGICAL REPAIR OF GLUTEAL TENDON;  Surgeon: Mauri Pole, MD;  [facility];  Service: Orthopedics;  Laterality: Right;    There were no vitals filed for this visit.   Subjective Assessment - 07/20/19 1306    Subjective  COVID-19 screening performed upon arrival.Patient arrives to physical therapy with her husband with reports of right shoulder pain and difficulty performing ADLs  due to a right SAD, DCR, and RTC repair on 07/13/2019. Patient gains assistance for dressing, showering, and grooming activities from her husband. Patient reports being consistent with HEP provided by surgeon. Patient reports pain at worst as 3/10 with shoulder movement and pain at best as 0/10 with Tylenol PRN and muscle relaxers. Patient's goals are to decrease pain, improve movement, improve strength, improve ability to perform ADLs and return to recreational activities such as gardening.    Pertinent History  right SAD, DCR, RTC repair 07/13/2019, HTN, RA, Osteoporosis    Limitations  Lifting;House hold activities    Patient Stated Goals  improve movement, use arm normally    Currently in Pain?  Yes    Pain Score  4     Pain Location  Shoulder    Pain  Orientation  Right    Pain Descriptors / Indicators  Sore    Pain Type  Surgical pain    Pain Onset  1 to 4 weeks ago    Pain Frequency  Intermittent    Aggravating Factors   moving shoulder    Pain Relieving Factors  Vascular compression unit    Effect of Pain on Daily Activities  difficulties with ADLs         [facility] PT Assessment - 07/20/19 0001      Assessment   Medical Diagnosis  Impingement syndrome of right shoulder region    Referring Provider (PT)  Justice Britain, MD    Onset Date/Surgical Date  07/13/19    Hand Dominance  Right    Next MD Visit  07/25/2019    Prior Therapy  no      Precautions   Precautions  Shoulder    Type of Shoulder Precautions  gentle PROM to right shoulder per referral, follow Dr. Susie [place] protocol      Restrictions   Weight Bearing Restrictions  Yes    RUE Weight Bearing  Non weight bearing      Balance Screen   Has the patient fallen in the past 6 months  No    Has the patient had a decrease in activity level because of a fear of falling?   No    Is the patient reluctant to leave their home because of a fear of falling?   No      Home Film/video editor residence    Living Arrangements  Spouse/significant other      Prior Function   Level of Independence  Needs assistance with homemaking;Needs assistance with ADLs      Observation/Other Assessments   Observations  steri-strips donned on 4 incisions of R shoulder    Focus on Therapeutic Outcomes (FOTO)   77% limitation      ROM / Strength   AROM / PROM / Strength  PROM      PROM   PROM Assessment Site  Shoulder    Right/Left Shoulder  Right    Right Shoulder Flexion  78 Degrees    Right Shoulder ABduction  43 Degrees    Right Shoulder Internal Rotation  --   to abdomen   Right Shoulder External Rotation  28 Degrees      Palpation   Palpation comment  tender to right shoulder upon palpation                  Objective measurements completed on  examination: See above findings.      [facility] Adult PT Treatment/Exercise - 07/20/19 0001  Modalities   Modalities  Vasopneumatic      Vasopneumatic   Number Minutes Vasopneumatic   10 minutes    Vasopnuematic Location   Shoulder    Vasopneumatic Pressure  Low    Vasopneumatic Temperature   34             PT Education - 07/20/19 1313    Education Details  Access code: [email or web address]    Person(s) Educated  Patient;Spouse    Methods  Explanation;Demonstration;Handout    Comprehension  Verbalized understanding          PT Long Term Goals - 07/20/19 1315      PT LONG TERM GOAL #1   Title  Patient will be independent with HEP    Time  4    Period  Weeks    Status  New      PT LONG TERM GOAL #2   Title  Patient will demonstate 140+ degrees of right shoulder flexion AROM to improve ability to perform overhead tasks    Time  4    Period  Weeks    Status  New      PT LONG TERM GOAL #3   Title  Patient will demonstrate 60+ degrees of right shoulder ER AROM to improve donning and doffing apparel.    Time  4    Period  Weeks    Status  New      PT LONG TERM GOAL #4   Title  Patient will demonstrate 4/5 or greater right shoulder MMT in all planes to improve stability during functional tasks.    Time  4    Period  Weeks    Status  New      PT LONG TERM GOAL #5   Title  Patient will report ability to perform ADLs and gardening activities with right shoulder pain less than or equal to 3/10.    Time  4    Period  Weeks    Status  New             Plan - 07/20/19 1323    Clinical Impression Statement  Patient is a 64 year old right handed female who presents to physical therapy with her husband with right shoulder pain and decreased right shoulder ROM secondary to a right shoulder SAD, DRC, and RTC repair on 07/13/2019. Patient requires max A for donning and doffing sling with abduction pillow. Patient required min A for supine to sit transitions. Patient and PT  discussed plan of care as well as HEP to maximize PT benefit. Patient reported understanding. Patient would benefit from skilled physical therapy to address deficits and address patient's goals.    Personal Factors and Comorbidities  Comorbidity 3+    Comorbidities  right SAD, DCR, RTC repair 07/13/2019, HTN, RA, Osteoporosis    Examination-Activity Limitations  Bathing;Sleep;Hygiene/Grooming;Dressing;Lift;Carry;Caring for Others;Toileting    Stability/Clinical Decision Making  Stable/Uncomplicated    Clinical Decision Making  Low    Rehab Potential  Excellent    PT Frequency  1x / week    PT Duration  4 weeks    PT Treatment/Interventions  Cryotherapy;Electrical Stimulation;ADLs/Self Care Home Management;Iontophoresis 4mg /ml Dexamethasone;Moist Heat;Ultrasound;Therapeutic activities;Therapeutic exercise;Neuromuscular re-education;Manual techniques;Passive range of motion;Patient/family education;Vasopneumatic Device;Taping    PT Next Visit Plan  Per referral, PROM only for 4 visits; Modalities PRN for pain relief.    PT Home Exercise Plan  see patient education section    Consulted and Agree with Plan of Care  Patient;Family member/caregiver  Family Member Consulted  Husband       Patient will benefit from skilled therapeutic intervention in order to improve the following deficits and impairments:  Decreased activity tolerance, Decreased strength, Decreased range of motion, Impaired UE functional use, Pain  Visit Diagnosis: Acute pain of right shoulder - Plan: PT plan of care cert/re-cert  Stiffness of right shoulder, not elsewhere classified - Plan: PT plan of care cert/re-cert  Muscle weakness (generalized) - Plan: PT plan of care cert/re-cert     Problem List Patient Active Problem List   Diagnosis Date Noted  . Bilateral occipital neuralgia 09/26/2017  . Status post left foot surgery 07/07/2015  . Neuroma 05/20/2015  . HAV (hallux abducto valgus) 05/20/2015  . Hammertoe  05/20/2015  . Prominent metatarsal head 05/20/2015  . Chronic foot pain 05/20/2015  . Anxiety state 03/03/2015  . Hip bursitis 10/22/2014  . Osteophyte of hip 10/21/2014  . Epigastric cramping 11/09/2013  . Nausea with vomiting 11/06/2013  . Rheumatoid arthritis ([place]) 11/01/2013  . S/P laparoscopic sleeve gastrectomy 10/30/2013  . Anemia 06/04/2013  . CAP (community acquired pneumonia) 02/17/2013  . Restless leg syndrome 09/18/2012  . Morbid obesity with BMI of 45.0-49.9, adult ([place]) 01/18/2012  . Right gluteus tear 01/17/2012  . Nipple discharge in female 07/08/2011  . Chronic insomnia 06/18/2011  . Discharge from nipple 01/06/2011  . TMJ PAIN 01/12/2010  . DEPRESSION 12/10/2009  . SINUSITIS, ACUTE 12/30/2008  . Lumbago 12/30/2008  . EDEMA 10/03/2008  . Hyperlipidemia 08/27/2008  . Essential hypertension 08/27/2008  . GERD 08/27/2008    Gabriela Eves, PT, DPT 07/20/2019, 1:36 PM  [facility] [address] [place], [place], [address] Phone: [phone]   Fax:  [phone]  Name: [name] MRN: [id number] Date of Birth: 02/06/1956

## 2019-07-24 ENCOUNTER — Encounter: Payer: Medicare Other | Admitting: Podiatry

## 2019-07-26 ENCOUNTER — Ambulatory Visit: Payer: Medicare Other | Attending: Orthopedic Surgery | Admitting: Physical Therapy

## 2019-07-26 ENCOUNTER — Other Ambulatory Visit: Payer: Self-pay

## 2019-07-26 ENCOUNTER — Encounter: Payer: Self-pay | Admitting: Physical Therapy

## 2019-07-26 DIAGNOSIS — M25611 Stiffness of right shoulder, not elsewhere classified: Secondary | ICD-10-CM

## 2019-07-26 DIAGNOSIS — M6281 Muscle weakness (generalized): Secondary | ICD-10-CM | POA: Diagnosis not present

## 2019-07-26 DIAGNOSIS — M25511 Pain in right shoulder: Secondary | ICD-10-CM | POA: Insufficient documentation

## 2019-07-26 NOTE — Therapy (Signed)
[facility] [place], [place], [address] Phone: [phone]   Fax:  [phone]  Physical Therapy Treatment  Patient Details  Name: [name] MRN: [id number] Date of Birth: 1955/11/20 Referring Provider (PT): Justice Britain, MD   Encounter Date: 07/26/2019  PT End of Session - 07/26/19 1004    Visit Number  2    Number of Visits  4    Authorization Type  FOTO (Initial: 77% limitation), progress note every 10th visit, KX modifier after 15th visit    PT Start Time  0946    PT Stop Time  1027    PT Time Calculation (min)  41 min    Activity Tolerance  Patient tolerated treatment well    Behavior During Therapy  [facility] for tasks assessed/performed       Past Medical History:  Diagnosis Date  . ANGIOMA 10/03/2008   REMOVED FROM RIGHT LOWER LEG-BENIGN  . Anxiety   . Arthritis    RA  . Bronchitis    hx of   . Cancer (HCC)    BASAL CELL SKIN CANCER  . Complication of anesthesia   . Fibromyalgia   . GERD 08/27/2008  . History of blood transfusion    1995  . History of hiatal hernia    hx of has had surgically repaired  . History of measles   . History of mumps   . History of nonmelanoma skin cancer   . HYPERLIPIDEMIA 08/27/2008  . HYPERTENSION 08/27/2008   pt is currently on no meds   . Pneumonia    hx of   . PONV (postoperative nausea and vomiting)   . Rheumatoid arthritis ([place])   . SEBORRHEIC KERATOSIS 08/27/2008    Past Surgical History:  Procedure Laterality Date  . ABDOMINAL HYSTERECTOMY  1995  . APPENDECTOMY    . BREAST BIOPSY Left   . BREAST DUCTAL SYSTEM EXCISION  08/16/2011   Procedure: EXCISION DUCTAL SYSTEM BREAST;  Surgeon: Joyice Faster. Cornett, MD;  Location: [place];  Service: General;  Laterality: Left;  left breast duct excision  . [place]   times 2; 1980 and 1982  . [place]  . CHOLECYSTECTOMY  1997  . EXCISION/RELEASE BURSA HIP Right 10/21/2014    Procedure: OPEN RIGHT HIP BURSECTOMY, EXOSECTOMY;  Surgeon: Paralee Cancel, MD;  [facility];  Service: Orthopedics;  Laterality: Right;  . foot surgery  2004, 2005, 2008   bilat secondary to neuropathy   . JOINT REPLACEMENT  10,12   lt total knee X2  . KNEE ARTHROSCOPY  2007, 2008, 2009  . LAPAROSCOPIC GASTRIC SLEEVE RESECTION N/A 10/30/2013   Procedure: LAPAROSCOPIC GASTRIC SLEEVE RESECTION WITH HIATAL HERNIA REPAIR AND UPPER ENDOSCOPY;  Surgeon: Greer Pickerel, MD;  [facility];  Service: General;  Laterality: N/A;  . NASAL SINUS SURGERY  2013  . OPEN SURGICAL REPAIR OF GLUTEAL TENDON  01/17/2012   Procedure: OPEN SURGICAL REPAIR OF GLUTEAL TENDON;  Surgeon: Mauri Pole, MD;  [facility];  Service: Orthopedics;  Laterality: Right;    There were no vitals filed for this visit.  Subjective Assessment - 07/26/19 0948    Subjective  COVID-19 screening performed upon arrival. Patient arrived with sorness in shoulder/with sling today    Pertinent History  right SAD, DCR, RTC repair 07/13/2019, HTN, RA, Osteoporosis    Limitations  Lifting;House hold activities    Patient Stated Goals  improve movement, use arm normally  Currently in Pain?  Yes    Pain Score  3     Pain Location  Shoulder    Pain Orientation  Right    Pain Descriptors / Indicators  Sore    Pain Type  Surgical pain    Pain Onset  1 to 4 weeks ago    Pain Frequency  Intermittent    Aggravating Factors   movement    Pain Relieving Factors  rest                        [facility] Adult PT Treatment/Exercise - 07/26/19 0001      Vasopneumatic   Number Minutes Vasopneumatic   10 minutes    Vasopnuematic Location   Shoulder    Vasopneumatic Pressure  Low    Vasopneumatic Temperature   34      Manual Therapy   Manual Therapy  Passive ROM    Passive ROM  manual PROM for right shoulder flexion, ER, IR with gentle ranger to improve mobility                  PT Long Term Goals -  07/20/19 1315      PT LONG TERM GOAL #1   Title  Patient will be independent with HEP    Time  4    Period  Weeks    Status  New      PT LONG TERM GOAL #2   Title  Patient will demonstate 140+ degrees of right shoulder flexion AROM to improve ability to perform overhead tasks    Time  4    Period  Weeks    Status  New      PT LONG TERM GOAL #3   Title  Patient will demonstrate 60+ degrees of right shoulder ER AROM to improve donning and doffing apparel.    Time  4    Period  Weeks    Status  New      PT LONG TERM GOAL #4   Title  Patient will demonstrate 4/5 or greater right shoulder MMT in all planes to improve stability during functional tasks.    Time  4    Period  Weeks    Status  New      PT LONG TERM GOAL #5   Title  Patient will report ability to perform ADLs and gardening activities with right shoulder pain less than or equal to 3/10.    Time  4    Period  Weeks    Status  New            Plan - 07/26/19 1022    Clinical Impression Statement  Patient tolerated treatment well today. Patient has reported doing initial HEP as directed. Today focused on PROM for right shoulder with gentle range to improve mobility. Smooth arch withing current range some discomfort when out of sling and requires support at all times. Today ossilations to relax guarding. Patient current goals ongoing. Progress with PROM for visits.    Personal Factors and Comorbidities  Comorbidity 3+    Comorbidities  right SAD, DCR, RTC repair 07/13/2019, HTN, RA, Osteoporosis    Examination-Activity Limitations  Bathing;Sleep;Hygiene/Grooming;Dressing;Lift;Carry;Caring for Others;Toileting    Stability/Clinical Decision Making  Stable/Uncomplicated    Rehab Potential  Excellent    PT Frequency  1x / week    PT Duration  4 weeks    PT Treatment/Interventions  Cryotherapy;Electrical Stimulation;ADLs/Self Care Home Management;Iontophoresis 4mg /ml Dexamethasone;Moist Heat;Ultrasound;Therapeutic  activities;Therapeutic exercise;Neuromuscular re-education;Manual techniques;Passive range of motion;Patient/family education;Vasopneumatic Device;Taping    PT Next Visit Plan  Per referral, PROM only for 4 visits; Modalities PRN for pain relief.    Consulted and Agree with Plan of Care  Patient       Patient will benefit from skilled therapeutic intervention in order to improve the following deficits and impairments:  Decreased activity tolerance, Decreased strength, Decreased range of motion, Impaired UE functional use, Pain  Visit Diagnosis: Acute pain of right shoulder  Stiffness of right shoulder, not elsewhere classified  Muscle weakness (generalized)     Problem List Patient Active Problem List   Diagnosis Date Noted  . Bilateral occipital neuralgia 09/26/2017  . Status post left foot surgery 07/07/2015  . Neuroma 05/20/2015  . HAV (hallux abducto valgus) 05/20/2015  . Hammertoe 05/20/2015  . Prominent metatarsal head 05/20/2015  . Chronic foot pain 05/20/2015  . Anxiety state 03/03/2015  . Hip bursitis 10/22/2014  . Osteophyte of hip 10/21/2014  . Epigastric cramping 11/09/2013  . Nausea with vomiting 11/06/2013  . Rheumatoid arthritis ([place]) 11/01/2013  . S/[name] laparoscopic sleeve gastrectomy 10/30/2013  . Anemia 06/04/2013  . CAP (community acquired pneumonia) 02/17/2013  . Restless leg syndrome 09/18/2012  . Morbid obesity with BMI of 45.0-49.9, adult ([place]) 01/18/2012  . Right gluteus tear 01/17/2012  . Nipple discharge in female 07/08/2011  . Chronic insomnia 06/18/2011  . Discharge from nipple 01/06/2011  . TMJ PAIN 01/12/2010  . DEPRESSION 12/10/2009  . SINUSITIS, ACUTE 12/30/2008  . Lumbago 12/30/2008  . EDEMA 10/03/2008  . Hyperlipidemia 08/27/2008  . Essential hypertension 08/27/2008  . GERD 08/27/2008    [name], PTA 07/26/2019, 10:30 AM  [facility] [place], [place],  [address] Phone: [phone]   Fax:  [phone]  Name: [name] MRN: [id number] Date of Birth: 05-15-1955

## 2019-08-01 ENCOUNTER — Ambulatory Visit: Payer: Medicare Other | Admitting: Physical Therapy

## 2019-08-01 ENCOUNTER — Other Ambulatory Visit: Payer: Self-pay

## 2019-08-01 DIAGNOSIS — M25511 Pain in right shoulder: Secondary | ICD-10-CM

## 2019-08-01 DIAGNOSIS — M6281 Muscle weakness (generalized): Secondary | ICD-10-CM | POA: Diagnosis not present

## 2019-08-01 DIAGNOSIS — M25611 Stiffness of right shoulder, not elsewhere classified: Secondary | ICD-10-CM | POA: Diagnosis not present

## 2019-08-01 NOTE — Therapy (Signed)
[facility] [place], [place], [address] Phone: [phone]   Fax:  [phone]  Physical Therapy Treatment  Patient Details  Name: [name] MRN: [id number] Date of Birth: October 28, 1955 Referring Provider (PT): Justice Britain, MD   Encounter Date: 08/01/2019  PT End of Session - 08/01/19 1027    Visit Number  3    Number of Visits  5    Date for PT Re-Evaluation  08/24/19    Authorization Type  FOTO (Initial: 77% limitation), progress note every 10th visit, KX modifier after 15th visit    PT Start Time  0952    PT Stop Time  1037    PT Time Calculation (min)  45 min    Activity Tolerance  Patient tolerated treatment well    Behavior During Therapy  [facility] for tasks assessed/performed       Past Medical History:  Diagnosis Date  . ANGIOMA 10/03/2008   REMOVED FROM RIGHT LOWER LEG-BENIGN  . Anxiety   . Arthritis    RA  . Bronchitis    hx of   . Cancer (HCC)    BASAL CELL SKIN CANCER  . Complication of anesthesia   . Fibromyalgia   . GERD 08/27/2008  . History of blood transfusion    1995  . History of hiatal hernia    hx of has had surgically repaired  . History of measles   . History of mumps   . History of nonmelanoma skin cancer   . HYPERLIPIDEMIA 08/27/2008  . HYPERTENSION 08/27/2008   pt is currently on no meds   . Pneumonia    hx of   . PONV (postoperative nausea and vomiting)   . Rheumatoid arthritis ([place])   . SEBORRHEIC KERATOSIS 08/27/2008    Past Surgical History:  Procedure Laterality Date  . ABDOMINAL HYSTERECTOMY  1995  . APPENDECTOMY    . BREAST BIOPSY Left   . BREAST DUCTAL SYSTEM EXCISION  08/16/2011   Procedure: EXCISION DUCTAL SYSTEM BREAST;  Surgeon: Joyice Faster. Cornett, MD;  Location: [facility];  Service: General;  Laterality: Left;  left breast duct excision  . [place]   times 2; 1980 and 1982  . [place]  . CHOLECYSTECTOMY  1997  .  EXCISION/RELEASE BURSA HIP Right 10/21/2014   Procedure: OPEN RIGHT HIP BURSECTOMY, EXOSECTOMY;  Surgeon: Paralee Cancel, MD;  [facility];  Service: Orthopedics;  Laterality: Right;  . foot surgery  2004, 2005, 2008   bilat secondary to neuropathy   . JOINT REPLACEMENT  10,12   lt total knee X2  . KNEE ARTHROSCOPY  2007, 2008, 2009  . LAPAROSCOPIC GASTRIC SLEEVE RESECTION N/A 10/30/2013   Procedure: LAPAROSCOPIC GASTRIC SLEEVE RESECTION WITH HIATAL HERNIA REPAIR AND UPPER ENDOSCOPY;  Surgeon: Greer Pickerel, MD;  [facility];  Service: General;  Laterality: N/A;  . NASAL SINUS SURGERY  2013  . OPEN SURGICAL REPAIR OF GLUTEAL TENDON  01/17/2012   Procedure: OPEN SURGICAL REPAIR OF GLUTEAL TENDON;  Surgeon: Mauri Pole, MD;  [facility];  Service: Orthopedics;  Laterality: Right;    There were no vitals filed for this visit.  Subjective Assessment - 08/01/19 1023    Subjective  COVID-19 screen performed prior to patient entering clinic.  No new complaints.    Pertinent History  right SAD, DCR, RTC repair 07/13/2019, HTN, RA, Osteoporosis    Limitations  Lifting;House hold activities    Patient  Stated Goals  improve movement, use arm normally    Currently in Pain?  Yes    Pain Score  3     Pain Orientation  Right    Pain Descriptors / Indicators  Sore    Pain Type  Surgical pain    Pain Onset  1 to 4 weeks ago                        [facility] Adult PT Treatment/Exercise - 08/01/19 0001      Vasopneumatic   Number Minutes Vasopneumatic   15 minutes    Vasopnuematic Location   --   RT shoulder.   Vasopneumatic Pressure  Low   Pillow between thorax and right elbow.     Manual Therapy   Manual Therapy  Passive ROM    Passive ROM  In supine:  Gentle right shoulder PROM x 23 minutes into flexion, abduction and ER.                  PT Long Term Goals - 07/20/19 1315      PT LONG TERM GOAL #1   Title  Patient will be independent with HEP    Time   4    Period  Weeks    Status  New      PT LONG TERM GOAL #2   Title  Patient will demonstate 140+ degrees of right shoulder flexion AROM to improve ability to perform overhead tasks    Time  4    Period  Weeks    Status  New      PT LONG TERM GOAL #3   Title  Patient will demonstrate 60+ degrees of right shoulder ER AROM to improve donning and doffing apparel.    Time  4    Period  Weeks    Status  New      PT LONG TERM GOAL #4   Title  Patient will demonstrate 4/5 or greater right shoulder MMT in all planes to improve stability during functional tasks.    Time  4    Period  Weeks    Status  New      PT LONG TERM GOAL #5   Title  Patient will report ability to perform ADLs and gardening activities with right shoulder pain less than or equal to 3/10.    Time  4    Period  Weeks    Status  New            Plan - 08/01/19 1031    Clinical Impression Statement  Patient is progressing well with improving right shoulder range of motion.  Patient states she is compliant to her HEP.    Personal Factors and Comorbidities  Comorbidity 3+    Comorbidities  right SAD, DCR, RTC repair 07/13/2019, HTN, RA, Osteoporosis    Examination-Activity Limitations  Bathing;Sleep;Hygiene/Grooming;Dressing;Lift;Carry;Caring for Others;Toileting    Stability/Clinical Decision Making  Stable/Uncomplicated    Rehab Potential  Excellent    PT Frequency  1x / week    PT Duration  4 weeks    PT Treatment/Interventions  Cryotherapy;Electrical Stimulation;ADLs/Self Care Home Management;Iontophoresis 4mg /ml Dexamethasone;Moist Heat;Ultrasound;Therapeutic activities;Therapeutic exercise;Neuromuscular re-education;Manual techniques;Passive range of motion;Patient/family education;Vasopneumatic Device;Taping    PT Next Visit Plan  Per referral, PROM only for 4 visits; Modalities PRN for pain relief.    PT Home Exercise Plan  see patient education section    Consulted and Agree with Plan of Care  Patient  Patient will benefit from skilled therapeutic intervention in order to improve the following deficits and impairments:  Decreased activity tolerance, Decreased strength, Decreased range of motion, Impaired UE functional use, Pain  Visit Diagnosis: Acute pain of right shoulder - Plan: PT plan of care cert/re-cert  Stiffness of right shoulder, not elsewhere classified - Plan: PT plan of care cert/re-cert  Muscle weakness (generalized) - Plan: PT plan of care cert/re-cert     Problem List Patient Active Problem List   Diagnosis Date Noted  . Bilateral occipital neuralgia 09/26/2017  . Status post left foot surgery 07/07/2015  . Neuroma 05/20/2015  . HAV (hallux abducto valgus) 05/20/2015  . Hammertoe 05/20/2015  . Prominent metatarsal head 05/20/2015  . Chronic foot pain 05/20/2015  . Anxiety state 03/03/2015  . Hip bursitis 10/22/2014  . Osteophyte of hip 10/21/2014  . Epigastric cramping 11/09/2013  . Nausea with vomiting 11/06/2013  . Rheumatoid arthritis ([place]) 11/01/2013  . S/P laparoscopic sleeve gastrectomy 10/30/2013  . Anemia 06/04/2013  . CAP (community acquired pneumonia) 02/17/2013  . Restless leg syndrome 09/18/2012  . Morbid obesity with BMI of 45.0-49.9, adult ([place]) 01/18/2012  . Right gluteus tear 01/17/2012  . Nipple discharge in female 07/08/2011  . Chronic insomnia 06/18/2011  . Discharge from nipple 01/06/2011  . TMJ PAIN 01/12/2010  . DEPRESSION 12/10/2009  . SINUSITIS, ACUTE 12/30/2008  . Lumbago 12/30/2008  . EDEMA 10/03/2008  . Hyperlipidemia 08/27/2008  . Essential hypertension 08/27/2008  . GERD 08/27/2008    Pressley Tadesse, [place] MPT 08/01/2019, 10:42 AM  [facility] [address] [place], [place], [address] Phone: [phone]   Fax:  [phone]  Name: [name] MRN: [id number] Date of Birth: May 02, 1955

## 2019-08-03 DIAGNOSIS — M65312 Trigger thumb, left thumb: Secondary | ICD-10-CM | POA: Diagnosis not present

## 2019-08-03 DIAGNOSIS — M65332 Trigger finger, left middle finger: Secondary | ICD-10-CM | POA: Diagnosis not present

## 2019-08-03 DIAGNOSIS — M79642 Pain in left hand: Secondary | ICD-10-CM | POA: Diagnosis not present

## 2019-08-03 DIAGNOSIS — M65322 Trigger finger, left index finger: Secondary | ICD-10-CM | POA: Diagnosis not present

## 2019-08-08 ENCOUNTER — Ambulatory Visit: Payer: Medicare Other | Admitting: Physical Therapy

## 2019-08-08 ENCOUNTER — Encounter: Payer: Self-pay | Admitting: Physical Therapy

## 2019-08-08 ENCOUNTER — Other Ambulatory Visit: Payer: Self-pay

## 2019-08-08 DIAGNOSIS — M25511 Pain in right shoulder: Secondary | ICD-10-CM

## 2019-08-08 DIAGNOSIS — M6281 Muscle weakness (generalized): Secondary | ICD-10-CM

## 2019-08-08 DIAGNOSIS — M25611 Stiffness of right shoulder, not elsewhere classified: Secondary | ICD-10-CM | POA: Diagnosis not present

## 2019-08-08 NOTE — Therapy (Signed)
[facility] [place], [place], [address] Phone: [phone]   Fax:  [phone]  Physical Therapy Treatment  Patient Details  Name: [name] MRN: [id number] Date of Birth: 25-Jul-1955 Referring Provider (PT): Justice Britain, MD   Encounter Date: 08/08/2019   PT End of Session - 08/08/19 1038    Visit Number 4    Number of Visits 5    Date for PT Re-Evaluation 08/24/19    Authorization Type FOTO (Initial: 77% limitation), progress note every 10th visit, KX modifier after 15th visit    PT Start Time 0945    PT Stop Time 1036    PT Time Calculation (min) 51 min    Equipment Utilized During Treatment Other (comment)   R sling with abduction pillow   Activity Tolerance Patient tolerated treatment well    Behavior During Therapy [facility] for tasks assessed/performed           Past Medical History:  Diagnosis Date  . ANGIOMA 10/03/2008   REMOVED FROM RIGHT LOWER LEG-BENIGN  . Anxiety   . Arthritis    RA  . Bronchitis    hx of   . Cancer (HCC)    BASAL CELL SKIN CANCER  . Complication of anesthesia   . Fibromyalgia   . GERD 08/27/2008  . History of blood transfusion    1995  . History of hiatal hernia    hx of has had surgically repaired  . History of measles   . History of mumps   . History of nonmelanoma skin cancer   . HYPERLIPIDEMIA 08/27/2008  . HYPERTENSION 08/27/2008   pt is currently on no meds   . Pneumonia    hx of   . PONV (postoperative nausea and vomiting)   . Rheumatoid arthritis ([place])   . SEBORRHEIC KERATOSIS 08/27/2008    Past Surgical History:  Procedure Laterality Date  . ABDOMINAL HYSTERECTOMY  1995  . APPENDECTOMY    . BREAST BIOPSY Left   . BREAST DUCTAL SYSTEM EXCISION  08/16/2011   Procedure: EXCISION DUCTAL SYSTEM BREAST;  Surgeon: Joyice Faster. Cornett, MD;  Location: [facility];  Service: General;  Laterality: Left;  left breast duct excision  . [place]    times 2; 1980 and 1982  . [place]  . CHOLECYSTECTOMY  1997  . EXCISION/RELEASE BURSA HIP Right 10/21/2014   Procedure: OPEN RIGHT HIP BURSECTOMY, EXOSECTOMY;  Surgeon: Paralee Cancel, MD;  [facility];  Service: Orthopedics;  Laterality: Right;  . foot surgery  2004, 2005, 2008   bilat secondary to neuropathy   . JOINT REPLACEMENT  10,12   lt total knee X2  . KNEE ARTHROSCOPY  2007, 2008, 2009  . LAPAROSCOPIC GASTRIC SLEEVE RESECTION N/A 10/30/2013   Procedure: LAPAROSCOPIC GASTRIC SLEEVE RESECTION WITH HIATAL HERNIA REPAIR AND UPPER ENDOSCOPY;  Surgeon: Greer Pickerel, MD;  [facility];  Service: General;  Laterality: N/A;  . NASAL SINUS SURGERY  2013  . OPEN SURGICAL REPAIR OF GLUTEAL TENDON  01/17/2012   Procedure: OPEN SURGICAL REPAIR OF GLUTEAL TENDON;  Surgeon: Mauri Pole, MD;  [facility];  Service: Orthopedics;  Laterality: Right;    There were no vitals filed for this visit.   Subjective Assessment - 08/08/19 1028    Subjective COVID-19 screen performed prior to patient entering clinic.  Patient arrives with minimal pain in R shoulder.    Pertinent History right SAD, DCR, RTC repair 07/13/2019,  HTN, RA, Osteoporosis    Limitations Lifting;House hold activities    Patient Stated Goals improve movement, use arm normally    Currently in Pain? Yes    Pain Score 1     Pain Location Shoulder    Pain Orientation Right    Pain Descriptors / Indicators Sore    Pain Type Surgical pain    Pain Onset 1 to 4 weeks ago    Pain Frequency Intermittent              [facility] PT Assessment - 08/08/19 0001      Assessment   Medical Diagnosis Impingement syndrome of right shoulder region    Referring Provider (PT) Justice Britain, MD    Onset Date/Surgical Date 07/13/19    Hand Dominance Right    Next MD Visit 08/14/2019    Prior Therapy no      Precautions   Precautions Shoulder    Type of Shoulder Precautions gentle PROM to right shoulder per referral, follow  Dr. Susie Cassette protocol                         [facility] Adult PT Treatment/Exercise - 08/08/19 0001      Vasopneumatic   Number Minutes Vasopneumatic  15 minutes    Vasopnuematic Location  Shoulder    Vasopneumatic Pressure Low    Vasopneumatic Temperature  34      Manual Therapy   Manual Therapy Passive ROM    Passive ROM Gentle right shoulder PROM into flexion and ER to improve ROM                       PT Long Term Goals - 07/20/19 1315      PT LONG TERM GOAL #1   Title Patient will be independent with HEP    Time 4    Period Weeks    Status New      PT LONG TERM GOAL #2   Title Patient will demonstate 140+ degrees of right shoulder flexion AROM to improve ability to perform overhead tasks    Time 4    Period Weeks    Status New      PT LONG TERM GOAL #3   Title Patient will demonstrate 60+ degrees of right shoulder ER AROM to improve donning and doffing apparel.    Time 4    Period Weeks    Status New      PT LONG TERM GOAL #4   Title Patient will demonstrate 4/5 or greater right shoulder MMT in all planes to improve stability during functional tasks.    Time 4    Period Weeks    Status New      PT LONG TERM GOAL #5   Title Patient will report ability to perform ADLs and gardening activities with right shoulder pain less than or equal to 3/10.    Time 4    Period Weeks    Status New                 Plan - 08/08/19 1039    Clinical Impression Statement Patient responded well to PROM to R shoulder but with intermittent guarding particularly at end range. Intermittent oscillations and gentle STW/M to R shoulder performed to decrease guarding and pain. No adverse affects upon removal of modalities.    Personal Factors and Comorbidities Comorbidity 3+    Comorbidities right SAD, DCR, RTC repair 07/13/2019, HTN,  RA, Osteoporosis    Examination-Activity Limitations Bathing;Sleep;Hygiene/Grooming;Dressing;Lift;Carry;Caring for  Others;Toileting    Stability/Clinical Decision Making Stable/Uncomplicated    Clinical Decision Making Low    Rehab Potential Excellent    PT Frequency 1x / week    PT Duration 4 weeks    PT Treatment/Interventions Cryotherapy;Electrical Stimulation;ADLs/Self Care Home Management;Iontophoresis 4mg /ml Dexamethasone;Moist Heat;Ultrasound;Therapeutic activities;Therapeutic exercise;Neuromuscular re-education;Manual techniques;Passive range of motion;Patient/family education;Vasopneumatic Device;Taping    PT Next Visit Plan Per referral, PROM only for 4 visits; Modalities PRN for pain relief.    PT Home Exercise Plan see patient education section    Consulted and Agree with Plan of Care Patient           Patient will benefit from skilled therapeutic intervention in order to improve the following deficits and impairments:  Decreased activity tolerance, Decreased strength, Decreased range of motion, Impaired UE functional use, Pain  Visit Diagnosis: Acute pain of right shoulder  Stiffness of right shoulder, not elsewhere classified  Muscle weakness (generalized)     Problem List Patient Active Problem List   Diagnosis Date Noted  . Bilateral occipital neuralgia 09/26/2017  . Status post left foot surgery 07/07/2015  . Neuroma 05/20/2015  . HAV (hallux abducto valgus) 05/20/2015  . Hammertoe 05/20/2015  . Prominent metatarsal head 05/20/2015  . Chronic foot pain 05/20/2015  . Anxiety state 03/03/2015  . Hip bursitis 10/22/2014  . Osteophyte of hip 10/21/2014  . Epigastric cramping 11/09/2013  . Nausea with vomiting 11/06/2013  . Rheumatoid arthritis ([place]) 11/01/2013  . S/P laparoscopic sleeve gastrectomy 10/30/2013  . Anemia 06/04/2013  . CAP (community acquired pneumonia) 02/17/2013  . Restless leg syndrome 09/18/2012  . Morbid obesity with BMI of 45.0-49.9, adult ([place]) 01/18/2012  . Right gluteus tear 01/17/2012  . Nipple discharge in female 07/08/2011  . Chronic  insomnia 06/18/2011  . Discharge from nipple 01/06/2011  . TMJ PAIN 01/12/2010  . DEPRESSION 12/10/2009  . SINUSITIS, ACUTE 12/30/2008  . Lumbago 12/30/2008  . EDEMA 10/03/2008  . Hyperlipidemia 08/27/2008  . Essential hypertension 08/27/2008  . GERD 08/27/2008    Gabriela Eves, PT, DPT 08/08/2019, 12:41 PM  [facility] [address] [place], [place], [address] Phone: [phone]   Fax:  [phone]  Name: Kimblery Diop MRN: [id number] Date of Birth: 1955-05-28

## 2019-08-11 DIAGNOSIS — M7742 Metatarsalgia, left foot: Secondary | ICD-10-CM | POA: Diagnosis not present

## 2019-08-15 ENCOUNTER — Ambulatory Visit: Payer: Medicare Other | Admitting: Physical Therapy

## 2019-08-15 ENCOUNTER — Encounter: Payer: Self-pay | Admitting: Physical Therapy

## 2019-08-15 ENCOUNTER — Other Ambulatory Visit: Payer: Self-pay

## 2019-08-15 DIAGNOSIS — M25511 Pain in right shoulder: Secondary | ICD-10-CM

## 2019-08-15 DIAGNOSIS — M25611 Stiffness of right shoulder, not elsewhere classified: Secondary | ICD-10-CM

## 2019-08-15 DIAGNOSIS — M6281 Muscle weakness (generalized): Secondary | ICD-10-CM | POA: Diagnosis not present
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
## 2019-08-22 DIAGNOSIS — M0589 Other rheumatoid arthritis with rheumatoid factor of multiple sites: Secondary | ICD-10-CM | POA: Diagnosis not present

## 2019-08-28 ENCOUNTER — Ambulatory Visit: Payer: Medicare Other | Admitting: Physical Therapy

## 2019-08-28 ENCOUNTER — Other Ambulatory Visit: Payer: Self-pay

## 2019-08-28 ENCOUNTER — Telehealth (INDEPENDENT_AMBULATORY_CARE_PROVIDER_SITE_OTHER): Payer: Medicare Other | Admitting: Family Medicine

## 2019-08-28 ENCOUNTER — Encounter: Payer: Self-pay | Admitting: Family Medicine

## 2019-08-28 DIAGNOSIS — J029 Acute pharyngitis, unspecified: Secondary | ICD-10-CM

## 2019-08-28 MED ORDER — CEPHALEXIN 500 MG PO CAPS: 500.0000 mg | ORAL_CAPSULE | Freq: Two times a day (BID) | ORAL | 0 refills | Status: DC

## 2019-08-28 NOTE — Progress Notes (Signed)
Virtual Visit via Telephone Note  I connected with [name] on 08/28/19 at 10:20 AM EDT by telephone and verified that I am speaking with the correct person using two identifiers.   I discussed the limitations, risks, security and privacy concerns of performing an evaluation and management service by telephone and the availability of in person appointments. I also discussed with the patient that there may be a patient responsible charge related to this service. The patient expressed understanding and agreed to proceed.  Location patient: home, [place] Location provider: work or home office Participants present for the call: patient, provider Patient did not have a visit in the prior 7 days to address this/these issue(s).   History of Present Illness:   Acute visit for laryngitis: -started: 1 week -symptoms: very sore throat, particularly when swallows, red with white spots, chills, swollen glands, laryngitis, fatigue -denies: fevers, NVD, SOB, body aches, much cough, nasal congestion -she was around several children at a wedding who had strep prior to this starting -fully vaccinated for covid19, moderna  Observations/Objective: Patient sounds cheerful and well on the phone. I do not appreciate any SOB. Speech and thought processing are grossly intact. Patient reported vitals:  Assessment and Plan:  Sore throat  -we discussed possible serious and likely etiologies, options for evaluation and workup, limitations of telemedicine visit vs in person visit, treatment, treatment risks and precautions. Pt prefers to treat via telemedicine empirically rather then risking or undertaking an in person visit at this moment. She prefers to treat empirically for possible strep pharyngitis. Discussed the risks of doing so and options instead for testing. She opted for keflex 500mg  bid x 10 days as has taken this before without issues despite her listed penicillin allergies. Discussed potential  for cross reactivity. Discussed symptomatic care with analgesics and gargles. Patient agrees to seek prompt in person care if worsening, new symptoms arise, or if is not improving with treatment.  Follow Up Instructions:   I did not refer this patient for an OV in the next 24 hours for this/these issue(s).  I discussed the assessment and treatment plan with the patient. The patient was provided an opportunity to ask questions and all were answered. The patient agreed with the plan and demonstrated an understanding of the instructions.   The patient was advised to call back or seek an in-person evaluation if the symptoms worsen or if the condition fails to improve as anticipated.  I provided  18 minutes of non-face-to-face time during this encounter.   Lucretia Kern, DO

## 2019-08-28 NOTE — Patient Instructions (Signed)
-  I sent the medication(s) we discussed to your pharmacy: Meds ordered this encounter  Medications  . cephALEXin (KEFLEX) 500 MG capsule    Sig: Take 1 capsule (500 mg total) by mouth 2 (two) times daily.    Dispense:  20 capsule    Refill:  0    Please let us know if you have any questions or concerns regarding this prescription.  I hope you are feeling better soon! Seek care promptly if your symptoms worsen, new concerns arise or you are not improving with treatment.

## 2019-08-29 DIAGNOSIS — M7542 Impingement syndrome of left shoulder: Secondary | ICD-10-CM | POA: Diagnosis not present

## 2019-08-29 DIAGNOSIS — Z4789 Encounter for other orthopedic aftercare: Secondary | ICD-10-CM | POA: Diagnosis not present

## 2019-08-29 DIAGNOSIS — M25512 Pain in left shoulder: Secondary | ICD-10-CM | POA: Diagnosis not present

## 2019-08-30 ENCOUNTER — Encounter: Payer: Medicare Other | Admitting: Physical Therapy

## 2019-09-02 ENCOUNTER — Other Ambulatory Visit: Payer: Self-pay | Admitting: Family Medicine

## 2019-09-03 ENCOUNTER — Ambulatory Visit: Payer: Medicare Other | Attending: Orthopedic Surgery | Admitting: Physical Therapy

## 2019-09-03 ENCOUNTER — Encounter: Payer: Self-pay | Admitting: Physical Therapy

## 2019-09-03 ENCOUNTER — Other Ambulatory Visit: Payer: Self-pay

## 2019-09-03 ENCOUNTER — Encounter: Payer: Self-pay | Admitting: Family Medicine

## 2019-09-03 DIAGNOSIS — M25611 Stiffness of right shoulder, not elsewhere classified: Secondary | ICD-10-CM | POA: Diagnosis not present

## 2019-09-03 DIAGNOSIS — M6281 Muscle weakness (generalized): Secondary | ICD-10-CM | POA: Insufficient documentation

## 2019-09-03 DIAGNOSIS — R600 Localized edema: Secondary | ICD-10-CM | POA: Diagnosis not present

## 2019-09-03 DIAGNOSIS — M25511 Pain in right shoulder: Secondary | ICD-10-CM

## 2019-09-03 NOTE — Telephone Encounter (Signed)
Pt returned your call.  

## 2019-09-03 NOTE — Therapy (Signed)
[facility] [place], [place], [address] Phone: [phone]   Fax:  [phone]  Physical Therapy Treatment Re-certification  Patient Details  Name: [name] MRN: [id number] Date of Birth: Jul 01, 1955 Referring Provider (PT): Justice Britain, MD   Encounter Date: 09/03/2019   PT End of Session - 09/03/19 1406    Visit Number 6    Number of Visits 17    Authorization Type FOTO (Initial: 77% limitation), progress note every 10th visit, KX modifier after 15th visit    PT Start Time 1340    PT Stop Time 1435    PT Time Calculation (min) 55 min    Equipment Utilized During Treatment Other (comment)    Activity Tolerance Patient tolerated treatment well    Behavior During Therapy [facility] for tasks assessed/performed           Past Medical History:  Diagnosis Date  . ANGIOMA 10/03/2008   REMOVED FROM RIGHT LOWER LEG-BENIGN  . Anxiety   . Arthritis    RA  . Bronchitis    hx of   . Cancer (HCC)    BASAL CELL SKIN CANCER  . Complication of anesthesia   . Fibromyalgia   . GERD 08/27/2008  . History of blood transfusion    1995  . History of hiatal hernia    hx of has had surgically repaired  . History of measles   . History of mumps   . History of nonmelanoma skin cancer   . HYPERLIPIDEMIA 08/27/2008  . HYPERTENSION 08/27/2008   pt is currently on no meds   . Pneumonia    hx of   . PONV (postoperative nausea and vomiting)   . Rheumatoid arthritis ([place])   . SEBORRHEIC KERATOSIS 08/27/2008    Past Surgical History:  Procedure Laterality Date  . ABDOMINAL HYSTERECTOMY  1995  . APPENDECTOMY    . BREAST BIOPSY Left   . BREAST DUCTAL SYSTEM EXCISION  08/16/2011   Procedure: EXCISION DUCTAL SYSTEM BREAST;  Surgeon: Joyice Faster. Cornett, MD;  Location: [facility];  Service: General;  Laterality: Left;  left breast duct excision  . [place]   times 2; 1980 and 1982  . [place]   . CHOLECYSTECTOMY  1997  . EXCISION/RELEASE BURSA HIP Right 10/21/2014   Procedure: OPEN RIGHT HIP BURSECTOMY, EXOSECTOMY;  Surgeon: Paralee Cancel, MD;  [facility];  Service: Orthopedics;  Laterality: Right;  . foot surgery  2004, 2005, 2008   bilat secondary to neuropathy   . JOINT REPLACEMENT  10,12   lt total knee X2  . KNEE ARTHROSCOPY  2007, 2008, 2009  . LAPAROSCOPIC GASTRIC SLEEVE RESECTION N/A 10/30/2013   Procedure: LAPAROSCOPIC GASTRIC SLEEVE RESECTION WITH HIATAL HERNIA REPAIR AND UPPER ENDOSCOPY;  Surgeon: Greer Pickerel, MD;  [facility];  Service: General;  Laterality: N/A;  . NASAL SINUS SURGERY  2013  . OPEN SURGICAL REPAIR OF GLUTEAL TENDON  01/17/2012   Procedure: OPEN SURGICAL REPAIR OF GLUTEAL TENDON;  Surgeon: Mauri Pole, MD;  [facility];  Service: Orthopedics;  Laterality: Right;    There were no vitals filed for this visit.   Subjective Assessment - 09/03/19 1349    Subjective Covid-19 screen perofrmed prior to pt entering clinic. Pt conPt arring to therpay with 2/10 pain in her R shoulder.    Pertinent History right SAD, DCR, RTC repair 07/13/2019, HTN, RA, Osteoporosis    Limitations Lifting;House hold activities  Patient Stated Goals improve movement, use arm normally    Currently in Pain? Yes    Pain Score 2     Pain Location Shoulder    Pain Orientation Right    Pain Descriptors / Indicators Sore    Pain Type Surgical pain    Pain Onset More than a month ago              [facility] PT Assessment - 09/03/19 0001      Assessment   Medical Diagnosis Impingement syndrome of right shoulder region    Referring Provider (PT) Justice Britain, MD    Onset Date/Surgical Date 07/13/19    Hand Dominance Right      Precautions   Precautions Shoulder    Type of Shoulder Precautions see protocol      Cognition   Overall Cognitive Status Within Functional Limits for tasks assessed      Observation/Other Assessments   Focus on Therapeutic  Outcomes (FOTO)  55% limitation (down from 77% on 07/20/2019)      PROM   Right Shoulder Flexion 140 Degrees    Right Shoulder ABduction 110 Degrees    Right Shoulder External Rotation 50 Degrees                         [facility] Adult PT Treatment/Exercise - 09/03/19 0001      Exercises   Exercises Shoulder      Shoulder Exercises: Supine   External Rotation AAROM;Right;15 reps    Flexion AAROM;15 reps      Shoulder Exercises: Seated   Other Seated Exercises UE Ranger circles, flexion, and scaption at 30 degrees angle.      Shoulder Exercises: Pulleys   Flexion 3 minutes      Vasopneumatic   Number Minutes Vasopneumatic  10 minutes    Vasopnuematic Location  Shoulder    Vasopneumatic Pressure Low    Vasopneumatic Temperature  34      Manual Therapy   Manual Therapy Passive ROM    Passive ROM Right shoulder PROM into flexion, ABD and ER to improve ROM                       PT Long Term Goals - 09/03/19 1429      PT LONG TERM GOAL #1   Title Patient will be independent with HEP    Time 4    Period Weeks    Status On-going      PT LONG TERM GOAL #2   Title Patient will demonstate 140+ degrees of right shoulder flexion AROM to improve ability to perform overhead tasks    Baseline 140 degrees passively    Period Weeks    Status On-going      PT LONG TERM GOAL #3   Title Patient will demonstrate 60+ degrees of right shoulder ER AROM to improve donning and doffing apparel.    Baseline 50 degrees passively    Period Weeks    Status On-going      PT LONG TERM GOAL #4   Title Patient will demonstrate 4/5 or greater right shoulder MMT in all planes to improve stability during functional tasks.    Status On-going      PT LONG TERM GOAL #5   Title Patient will report ability to perform ADLs and gardening activities with right shoulder pain less than or equal to 3/10.    Status On-going  Plan - 09/03/19 1426    Clinical  Impression Statement Pt presenting today after not being in rehab due to soar throat smptoms and adverse reaction to her medicaitons. Pt has made progress with her shoulder flexion, ER, and Abduction. Pt's FOTO score has improved from 77% limitation to 55% limitation. In pt's 5 previous visits no goals have been. I am requesting 12 additional visits to progress toward pt's LTG's following Dr. Susie Cassette protocol.    Personal Factors and Comorbidities Comorbidity 3+    Comorbidities right SAD, DCR, RTC repair 07/13/2019, HTN, RA, Osteoporosis    Examination-Activity Limitations Bathing;Sleep;Hygiene/Grooming;Dressing;Lift;Carry;Caring for Others;Toileting    Stability/Clinical Decision Making Stable/Uncomplicated    Rehab Potential Excellent    PT Frequency 2x / week    PT Duration 6 weeks    PT Treatment/Interventions Cryotherapy;Electrical Stimulation;ADLs/Self Care Home Management;Iontophoresis 4mg /ml Dexamethasone;Moist Heat;Ultrasound;Therapeutic activities;Therapeutic exercise;Neuromuscular re-education;Manual techniques;Passive range of motion;Patient/family education;Vasopneumatic Device;Taping    PT Next Visit Plan continue per MD discretion and per protocol.    PT Home Exercise Plan see patient education section    Consulted and Agree with Plan of Care Patient           Patient will benefit from skilled therapeutic intervention in order to improve the following deficits and impairments:  Decreased activity tolerance, Decreased strength, Decreased range of motion, Impaired UE functional use, Pain  Visit Diagnosis: Stiffness of right shoulder, not elsewhere classified  Acute pain of right shoulder  Muscle weakness (generalized)  Localized edema     Problem List Patient Active Problem List   Diagnosis Date Noted  . Bilateral occipital neuralgia 09/26/2017  . Status post left foot surgery 07/07/2015  . Neuroma 05/20/2015  . HAV (hallux abducto valgus) 05/20/2015  . Hammertoe  05/20/2015  . Prominent metatarsal head 05/20/2015  . Chronic foot pain 05/20/2015  . Anxiety state 03/03/2015  . Hip bursitis 10/22/2014  . Osteophyte of hip 10/21/2014  . Epigastric cramping 11/09/2013  . Nausea with vomiting 11/06/2013  . Rheumatoid arthritis ([place]) 11/01/2013  . S/P laparoscopic sleeve gastrectomy 10/30/2013  . Anemia 06/04/2013  . CAP (community acquired pneumonia) 02/17/2013  . Restless leg syndrome 09/18/2012  . Morbid obesity with BMI of 45.0-49.9, adult ([place]) 01/18/2012  . Right gluteus tear 01/17/2012  . Nipple discharge in female 07/08/2011  . Chronic insomnia 06/18/2011  . Discharge from nipple 01/06/2011  . TMJ PAIN 01/12/2010  . DEPRESSION 12/10/2009  . SINUSITIS, ACUTE 12/30/2008  . Lumbago 12/30/2008  . EDEMA 10/03/2008  . Hyperlipidemia 08/27/2008  . Essential hypertension 08/27/2008  . GERD 08/27/2008    Oretha Caprice, PT, MPT 09/03/2019, 2:32 PM  [facility] [address] [place], [place], [address] Phone: [phone]   Fax:  [phone]  Name: Syreeta Figler MRN: [id number] Date of Birth: 06-30-1955

## 2019-09-03 NOTE — Telephone Encounter (Signed)
Lvm for pt to call back needs a virtual visit . If still having reaction needs to be sent to nurse triage.also sending a Pharmacist, community message

## 2019-09-04 ENCOUNTER — Telehealth (INDEPENDENT_AMBULATORY_CARE_PROVIDER_SITE_OTHER): Payer: Medicare Other | Admitting: Family Medicine

## 2019-09-04 DIAGNOSIS — J040 Acute laryngitis: Secondary | ICD-10-CM

## 2019-09-04 DIAGNOSIS — J029 Acute pharyngitis, unspecified: Secondary | ICD-10-CM | POA: Diagnosis not present

## 2019-09-04 DIAGNOSIS — R490 Dysphonia: Secondary | ICD-10-CM | POA: Diagnosis not present

## 2019-09-04 DIAGNOSIS — Z20822 Contact with and (suspected) exposure to covid-19: Secondary | ICD-10-CM | POA: Diagnosis not present

## 2019-09-04 NOTE — Progress Notes (Signed)
Patient ID: [name], female   DOB: 10-13-1955, 64 y.o.   MRN: [id number]   This visit type was conducted due to national recommendations for restrictions regarding the COVID-19 pandemic in an effort to limit this patient's exposure and mitigate transmission in our community.   Virtual Visit via Video Note  I connected with [name] on 09/04/19 at  8:15 AM EDT by a video enabled telemedicine application and verified that I am speaking with the correct person using two identifiers.  Location patient: home Location provider:work or home office Persons participating in the virtual visit: patient, provider  I discussed the limitations of evaluation and management by telemedicine and the availability of in person appointments. The patient expressed understanding and agreed to proceed.   HPI: [name] relates over 3-week history of sore throat and laryngitis symptoms.  She did video encounter on the sixth and those notes were reviewed.  She was prescribed Keflex to cover empirically for strep and took this for 4 days but developed blistery rash in several areas.  Her rash is cleared.  She has not had any fever.  No cough.  No postnasal drip.  No GERD symptoms.  She tried salt water gargles without improvement.  No significant adenopathy.  She is an ex-smoker.  Her laryngitis symptoms have now been over 3-week duration.  She has had complicated sinusitis in the past with sphenoid sinusitis and prior surgeries but she states she has no sinusitis symptoms.  No headache.  No facial pain.   ROS: See pertinent positives and negatives per HPI.  Past Medical History:  Diagnosis Date  . ANGIOMA 10/03/2008   REMOVED FROM RIGHT LOWER LEG-BENIGN  . Anxiety   . Arthritis    RA  . Bronchitis    hx of   . Cancer (HCC)    BASAL CELL SKIN CANCER  . Complication of anesthesia   . Fibromyalgia   . GERD 08/27/2008  . History of blood transfusion    1995  . History of hiatal hernia    hx of has  had surgically repaired  . History of measles   . History of mumps   . History of nonmelanoma skin cancer   . HYPERLIPIDEMIA 08/27/2008  . HYPERTENSION 08/27/2008   pt is currently on no meds   . Pneumonia    hx of   . PONV (postoperative nausea and vomiting)   . Rheumatoid arthritis ([place])   . SEBORRHEIC KERATOSIS 08/27/2008    Past Surgical History:  Procedure Laterality Date  . ABDOMINAL HYSTERECTOMY  1995  . APPENDECTOMY    . BREAST BIOPSY Left   . BREAST DUCTAL SYSTEM EXCISION  08/16/2011   Procedure: EXCISION DUCTAL SYSTEM BREAST;  Surgeon: Joyice Faster. Cornett, MD;  Location: [place];  Service: General;  Laterality: Left;  left breast duct excision  . [place]   times 2; 1980 and 1982  . [place]  . CHOLECYSTECTOMY  1997  . EXCISION/RELEASE BURSA HIP Right 10/21/2014   Procedure: OPEN RIGHT HIP BURSECTOMY, EXOSECTOMY;  Surgeon: Paralee Cancel, MD;  [facility];  Service: Orthopedics;  Laterality: Right;  . foot surgery  2004, 2005, 2008   bilat secondary to neuropathy   . JOINT REPLACEMENT  10,12   lt total knee X2  . KNEE ARTHROSCOPY  2007, 2008, 2009  . LAPAROSCOPIC GASTRIC SLEEVE RESECTION N/A 10/30/2013   Procedure: LAPAROSCOPIC GASTRIC SLEEVE RESECTION WITH HIATAL HERNIA REPAIR AND UPPER ENDOSCOPY;  Surgeon: Greer Pickerel, MD;  [facility];  Service: General;  Laterality: N/A;  . NASAL SINUS SURGERY  2013  . OPEN SURGICAL REPAIR OF GLUTEAL TENDON  01/17/2012   Procedure: OPEN SURGICAL REPAIR OF GLUTEAL TENDON;  Surgeon: Mauri Pole, MD;  [facility];  Service: Orthopedics;  Laterality: Right;    Family History  Problem Relation Age of Onset  . Cancer Mother        breast  . Breast cancer Mother 44  . Kidney disease Father   . Epilepsy Father   . Heart attack Sister   . Heart attack Sister   . Diabetes Son     SOCIAL HX: Ex-smoker.  She quit about 35 years ago   Current Outpatient Medications:  .   acetaminophen (TYLENOL) 500 MG tablet, Take 1,000 mg by mouth every 6 (six) hours as needed for pain. , Disp: , Rfl:  .  Biotin 1000 MCG tablet, Take 3,000 mcg by mouth daily., Disp: , Rfl:  .  cephALEXin (KEFLEX) 500 MG capsule, Take 1 capsule (500 mg total) by mouth 2 (two) times daily., Disp: 20 capsule, Rfl: 0 .  cyclobenzaprine (FLEXERIL) 5 MG tablet, Take 5 mg by mouth 2 (two) times daily., Disp: , Rfl:  .  EPINEPHrine (EPIPEN 2-PAK) 0.3 mg/0.3 mL IJ SOAJ injection, Inject 0.3 mLs (0.3 mg total) into the muscle once., Disp: 2 each, Rfl: 1 .  estradiol (ESTRACE) 1 MG tablet, Take 1 tablet by mouth once daily, Disp: 90 tablet, Rfl: 0 .  etanercept (ENBREL) 50 MG/ML injection, Inject 0.98 mLs (50 mg total) into the skin once a week. Resume end of next week (Patient taking differently: Inject 50 mg into the skin once a week. Friday), Disp: 0.98 mL, Rfl: 0 .  folic acid (FOLVITE) 1 MG tablet, Take 2 mg by mouth daily. , Disp: , Rfl:  .  hydroxychloroquine (PLAQUENIL) 200 MG tablet, TAKE 1 TABLET BY MOUTH ONCE DAILY WITH FOOD OR MILK FOR 90 DAYS, Disp: , Rfl:  .  lisinopril (ZESTRIL) 10 MG tablet, Take 1 tablet (10 mg total) by mouth daily., Disp: 90 tablet, Rfl: 3 .  methotrexate (50 MG/ML) 1 gm SOLR, May resume previous dose at  end of next week (Patient taking differently: Inject 50 mg into the skin once a week. Friday), Disp: , Rfl:  .  ondansetron (ZOFRAN ODT) 4 MG disintegrating tablet, 4mg  ODT q4 hours prn nausea/vomit, Disp: 20 tablet, Rfl: 0 .  oxyCODONE-acetaminophen (PERCOCET/ROXICET) 5-325 MG tablet, TAKE 1 TABLET AS NEEDED EVERY 8 HOURS FOR PAIN, Disp: , Rfl: 0 .  predniSONE (DELTASONE) 5 MG tablet, Take 5 mg by mouth daily with breakfast., Disp: , Rfl:  .  scopolamine (TRANSDERM-SCOP) 1 MG/3DAYS, Place 1 patch (1.5 mg total) onto the skin every 3 (three) days. Behind ear, Disp: 10 patch, Rfl: 0 .  sertraline (ZOLOFT) 50 MG tablet, TAKE 1 & 1/2 (ONE & ONE-HALF) TABLETS BY MOUTH ONCE  DAILY, Disp: 135 tablet, Rfl: 0 .  zolpidem (AMBIEN) 10 MG tablet, TAKE 1 TABLET BY MOUTH AT BEDTIME AS NEEDED FOR SLEEP, Disp: 90 tablet, Rfl: 1  EXAM:  VITALS per patient if applicable:  GENERAL: alert, oriented, appears well and in no acute distress  HEENT: atraumatic, conjunttiva clear, no obvious abnormalities on inspection of external nose and ears  NECK: normal movements of the head and neck  LUNGS: on inspection no signs of respiratory distress, breathing rate appears normal, no obvious gross SOB, gasping  or wheezing  CV: no obvious cyanosis  MS: moves all visible extremities without noticeable abnormality  PSYCH/NEURO: pleasant and cooperative, no obvious depression or anxiety, speech and thought processing grossly intact  ASSESSMENT AND PLAN:  Discussed the following assessment and plan:  Laryngitis - Plan: Ambulatory referral to ENT   Given duration of laryngitis for over 3 weeks and prior smoking status we will set up ENT referral  Continue salt water gargles and over-the-counter analgesics in the meantime     I discussed the assessment and treatment plan with the patient. The patient was provided an opportunity to ask questions and all were answered. The patient agreed with the plan and demonstrated an understanding of the instructions.   The patient was advised to call back or seek an in-person evaluation if the symptoms worsen or if the condition fails to improve as anticipated.     Carolann Littler, MD

## 2019-09-07 ENCOUNTER — Ambulatory Visit: Payer: Medicare Other | Admitting: Physical Therapy

## 2019-09-10 DIAGNOSIS — M25512 Pain in left shoulder: Secondary | ICD-10-CM | POA: Diagnosis not present

## 2019-09-10 DIAGNOSIS — M150 Primary generalized (osteo)arthritis: Secondary | ICD-10-CM | POA: Diagnosis not present

## 2019-09-10 DIAGNOSIS — M0589 Other rheumatoid arthritis with rheumatoid factor of multiple sites: Secondary | ICD-10-CM | POA: Diagnosis not present

## 2019-09-10 DIAGNOSIS — M542 Cervicalgia: Secondary | ICD-10-CM | POA: Diagnosis not present

## 2019-09-10 DIAGNOSIS — M797 Fibromyalgia: Secondary | ICD-10-CM | POA: Diagnosis not present

## 2019-09-10 DIAGNOSIS — M7742 Metatarsalgia, left foot: Secondary | ICD-10-CM | POA: Diagnosis not present

## 2019-09-13 DIAGNOSIS — R490 Dysphonia: Secondary | ICD-10-CM | POA: Diagnosis not present

## 2019-09-13 DIAGNOSIS — Z87891 Personal history of nicotine dependence: Secondary | ICD-10-CM | POA: Diagnosis not present

## 2019-09-13 DIAGNOSIS — R1312 Dysphagia, oropharyngeal phase: Secondary | ICD-10-CM | POA: Diagnosis not present

## 2019-09-13 DIAGNOSIS — J3801 Paralysis of vocal cords and larynx, unilateral: Secondary | ICD-10-CM | POA: Diagnosis not present

## 2019-09-13 DIAGNOSIS — M069 Rheumatoid arthritis, unspecified: Secondary | ICD-10-CM | POA: Diagnosis not present

## 2019-09-13 DIAGNOSIS — M797 Fibromyalgia: Secondary | ICD-10-CM | POA: Diagnosis not present

## 2019-09-13 DIAGNOSIS — J385 Laryngeal spasm: Secondary | ICD-10-CM | POA: Diagnosis not present

## 2019-09-13 DIAGNOSIS — J383 Other diseases of vocal cords: Secondary | ICD-10-CM | POA: Diagnosis not present

## 2019-09-13 DIAGNOSIS — M7542 Impingement syndrome of left shoulder: Secondary | ICD-10-CM | POA: Insufficient documentation

## 2019-09-14 DIAGNOSIS — J383 Other diseases of vocal cords: Secondary | ICD-10-CM | POA: Insufficient documentation

## 2019-09-14 DIAGNOSIS — J385 Laryngeal spasm: Secondary | ICD-10-CM | POA: Insufficient documentation

## 2019-09-14 DIAGNOSIS — R490 Dysphonia: Secondary | ICD-10-CM | POA: Insufficient documentation

## 2019-09-14 DIAGNOSIS — J3801 Paralysis of vocal cords and larynx, unilateral: Secondary | ICD-10-CM | POA: Insufficient documentation

## 2019-09-17 ENCOUNTER — Ambulatory Visit (INDEPENDENT_AMBULATORY_CARE_PROVIDER_SITE_OTHER): Payer: Medicare Other | Admitting: Otolaryngology

## 2019-09-19 ENCOUNTER — Other Ambulatory Visit: Payer: Self-pay

## 2019-09-19 ENCOUNTER — Ambulatory Visit: Payer: Medicare Other | Admitting: Physical Therapy

## 2019-09-19 DIAGNOSIS — R600 Localized edema: Secondary | ICD-10-CM | POA: Diagnosis not present

## 2019-09-19 DIAGNOSIS — M6281 Muscle weakness (generalized): Secondary | ICD-10-CM

## 2019-09-19 DIAGNOSIS — M25611 Stiffness of right shoulder, not elsewhere classified: Secondary | ICD-10-CM

## 2019-09-19 DIAGNOSIS — M25511 Pain in right shoulder: Secondary | ICD-10-CM

## 2019-09-19 NOTE — Therapy (Signed)
[facility] [place], [place], [address] Phone: [phone]   Fax:  [phone]  Physical Therapy Treatment  Patient Details  Name: [name] MRN: [id number] Date of Birth: [birth year] Referring Provider (PT): Justice Britain, MD   Encounter Date: 09/19/2019   PT End of Session - 09/19/19 1123    Visit Number 7    Number of Visits 17    Date for PT Re-Evaluation 08/24/19    Authorization Type FOTO (Initial: 77% limitation), progress note every 10th visit, KX modifier after 15th visit    PT Start Time 1116    PT Stop Time 1156    PT Time Calculation (min) 40 min    Activity Tolerance Patient tolerated treatment well    Behavior During Therapy [facility] for tasks assessed/performed           Past Medical History:  Diagnosis Date  . ANGIOMA 10/03/2008   REMOVED FROM RIGHT LOWER LEG-BENIGN  . Anxiety   . Arthritis    RA  . Bronchitis    hx of   . Cancer (HCC)    BASAL CELL SKIN CANCER  . Complication of anesthesia   . Fibromyalgia   . GERD 08/27/2008  . History of blood transfusion    1995  . History of hiatal hernia    hx of has had surgically repaired  . History of measles   . History of mumps   . History of nonmelanoma skin cancer   . HYPERLIPIDEMIA 08/27/2008  . HYPERTENSION 08/27/2008   pt is currently on no meds   . Pneumonia    hx of   . PONV (postoperative nausea and vomiting)   . Rheumatoid arthritis ([place])   . SEBORRHEIC KERATOSIS 08/27/2008    Past Surgical History:  Procedure Laterality Date  . ABDOMINAL HYSTERECTOMY  1995  . APPENDECTOMY    . BREAST BIOPSY Left   . BREAST DUCTAL SYSTEM EXCISION  08/16/2011   Procedure: EXCISION DUCTAL SYSTEM BREAST;  Surgeon: Joyice Faster. Cornett, MD;  Location: [place];  Service: General;  Laterality: Left;  left breast duct excision  . [place]   times 2; 1980 and 1982  . [place]  . CHOLECYSTECTOMY  1997  .  EXCISION/RELEASE BURSA HIP Right 10/21/2014   Procedure: OPEN RIGHT HIP BURSECTOMY, EXOSECTOMY;  Surgeon: Paralee Cancel, MD;  [facility];  Service: Orthopedics;  Laterality: Right;  . foot surgery  2004, 2005, 2008   bilat secondary to neuropathy   . JOINT REPLACEMENT  10,12   lt total knee X2  . KNEE ARTHROSCOPY  2007, 2008, 2009  . LAPAROSCOPIC GASTRIC SLEEVE RESECTION N/A 10/30/2013   Procedure: LAPAROSCOPIC GASTRIC SLEEVE RESECTION WITH HIATAL HERNIA REPAIR AND UPPER ENDOSCOPY;  Surgeon: Greer Pickerel, MD;  [facility];  Service: General;  Laterality: N/A;  . NASAL SINUS SURGERY  2013  . OPEN SURGICAL REPAIR OF GLUTEAL TENDON  01/17/2012   Procedure: OPEN SURGICAL REPAIR OF GLUTEAL TENDON;  Surgeon: Mauri Pole, MD;  [facility];  Service: Orthopedics;  Laterality: Right;    There were no vitals filed for this visit.   Subjective Assessment - 09/19/19 1118    Subjective Covid-19 screen perofrmed prior to pt entering clinic. Patient arrived with no new complaints    Pertinent History right SAD, DCR, RTC repair 07/13/2019, HTN, RA, Osteoporosis    Limitations Lifting;House hold activities    Patient Stated Goals improve movement,  use arm normally    Currently in Pain? Yes    Pain Score 2     Pain Location Shoulder    Pain Orientation Right    Pain Descriptors / Indicators Sore    Pain Type Surgical pain    Pain Onset More than a month ago    Pain Frequency Intermittent    Aggravating Factors  prolong movement    Pain Relieving Factors at rest              [facility] PT Assessment - 09/19/19 0001      PROM   PROM Assessment Site Shoulder    Right/Left Shoulder Right    Right Shoulder Flexion 142 Degrees    Right Shoulder External Rotation 59 Degrees                         [facility] Adult PT Treatment/Exercise - 09/19/19 0001      Shoulder Exercises: Supine   External Rotation AAROM;Right;20 reps   w cane   Flexion AAROM;Both;20 reps   w cane      Shoulder Exercises: Pulleys   Flexion 3 minutes    Other Pulley Exercises seated UE ranger for circles and flexion 2x10 each way      Vasopneumatic   Number Minutes Vasopneumatic  10 minutes    Vasopnuematic Location  Shoulder    Vasopneumatic Pressure Low    Vasopneumatic Temperature  34      Manual Therapy   Manual Therapy Passive ROM    Manual therapy comments rhythmic stabs for IR/ER in scaption and flex/ext @ 90    Passive ROM Right shoulder PROM into flexion, ABD and ER to improve ROM                       PT Long Term Goals - 09/19/19 1125      PT LONG TERM GOAL #1   Title Patient will be independent with HEP    Time 4    Period Weeks    Status On-going      PT LONG TERM GOAL #2   Title Patient will demonstate 140+ degrees of right shoulder flexion AROM to improve ability to perform overhead tasks    Time 4    Period Weeks    Status On-going      PT LONG TERM GOAL #3   Title Patient will demonstrate 60+ degrees of right shoulder ER AROM to improve donning and doffing apparel.    Time 4    Period Weeks    Status On-going      PT LONG TERM GOAL #4   Title Patient will demonstrate 4/5 or greater right shoulder MMT in all planes to improve stability during functional tasks.    Time 4    Period Weeks    Status On-going      PT LONG TERM GOAL #5   Title Patient will report ability to perform ADLs and gardening activities with right shoulder pain less than or equal to 3/10.    Time 4    Period Weeks    Status On-going                 Plan - 09/19/19 1140    Clinical Impression Statement Patient tolerated treatment well today. Patient has went to MD for F/U and reported good progress and healing. Today focused on PROM and AAROM to improve mobility. Patient improved with right shoulder  flexion and ER today. Patient reported doing HEP daily with no difficulty. Patient current goals ongoing at this time.    Personal Factors and Comorbidities  Comorbidity 3+    Comorbidities right SAD, DCR, RTC repair 07/13/2019, HTN, RA, Osteoporosis    Examination-Activity Limitations Bathing;Sleep;Hygiene/Grooming;Dressing;Lift;Carry;Caring for Others;Toileting    Stability/Clinical Decision Making Stable/Uncomplicated    Rehab Potential Excellent    PT Frequency 2x / week    PT Duration 6 weeks    PT Treatment/Interventions Cryotherapy;Electrical Stimulation;ADLs/Self Care Home Management;Iontophoresis 4mg /ml Dexamethasone;Moist Heat;Ultrasound;Therapeutic activities;Therapeutic exercise;Neuromuscular re-education;Manual techniques;Passive range of motion;Patient/family education;Vasopneumatic Device;Taping    PT Next Visit Plan cont with POC per protocol (9 week 09/14/19) PROM/AAROM and progress Isomrtrics next visit with HEP    Consulted and Agree with Plan of Care Patient           Patient will benefit from skilled therapeutic intervention in order to improve the following deficits and impairments:  Decreased activity tolerance, Decreased strength, Decreased range of motion, Impaired UE functional use, Pain  Visit Diagnosis: Stiffness of right shoulder, not elsewhere classified  Acute pain of right shoulder  Muscle weakness (generalized)  Localized edema     Problem List Patient Active Problem List   Diagnosis Date Noted  . Bilateral occipital neuralgia 09/26/2017  . Status post left foot surgery 07/07/2015  . Neuroma 05/20/2015  . HAV (hallux abducto valgus) 05/20/2015  . Hammertoe 05/20/2015  . Prominent metatarsal head 05/20/2015  . Chronic foot pain 05/20/2015  . Anxiety state 03/03/2015  . Hip bursitis 10/22/2014  . Osteophyte of hip 10/21/2014  . Epigastric cramping 11/09/2013  . Nausea with vomiting 11/06/2013  . Rheumatoid arthritis ([place]) 11/01/2013  . S/P laparoscopic sleeve gastrectomy 10/30/2013  . Anemia 06/04/2013  . CAP (community acquired pneumonia) 02/17/2013  . Restless leg syndrome 09/18/2012  .  Morbid obesity with BMI of 45.0-49.9, adult ([place]) 01/18/2012  . Right gluteus tear 01/17/2012  . Nipple discharge in female 07/08/2011  . Chronic insomnia 06/18/2011  . Discharge from nipple 01/06/2011  . TMJ PAIN 01/12/2010  . DEPRESSION 12/10/2009  . SINUSITIS, ACUTE 12/30/2008  . Lumbago 12/30/2008  . EDEMA 10/03/2008  . Hyperlipidemia 08/27/2008  . Essential hypertension 08/27/2008  . GERD 08/27/2008    Phillips Climes, PTA 09/19/2019, 11:58 AM  [facility] [place], [place], [address] Phone: [phone]   Fax:  [phone]  Name: [name] MRN: [id number] Date of Birth: [birth year]

## 2019-09-20 ENCOUNTER — Other Ambulatory Visit: Payer: Self-pay

## 2019-09-20 ENCOUNTER — Ambulatory Visit: Payer: Medicare Other | Admitting: Physical Therapy

## 2019-09-20 DIAGNOSIS — M25511 Pain in right shoulder: Secondary | ICD-10-CM | POA: Diagnosis not present

## 2019-09-20 DIAGNOSIS — R600 Localized edema: Secondary | ICD-10-CM | POA: Diagnosis not present

## 2019-09-20 DIAGNOSIS — M6281 Muscle weakness (generalized): Secondary | ICD-10-CM | POA: Diagnosis not present

## 2019-09-20 DIAGNOSIS — M25611 Stiffness of right shoulder, not elsewhere classified: Secondary | ICD-10-CM | POA: Diagnosis not present

## 2019-09-20 NOTE — Therapy (Signed)
[facility] [place], [place], [address] Phone: [phone]   Fax:  [phone]  Physical Therapy Treatment  Patient Details  Name: [name] MRN: [id number] Date of Birth: 10/06/1955 Referring Provider (PT): Justice Britain, MD   Encounter Date: 09/20/2019   PT End of Session - 09/20/19 1217    Visit Number 8    Number of Visits 17    Date for PT Re-Evaluation 08/24/19    Authorization Type FOTO (Initial: 77% limitation), progress note every 10th visit, KX modifier after 15th visit    PT Start Time 1200    PT Stop Time 1245    PT Time Calculation (min) 45 min    Equipment Utilized During Treatment Other (comment)    Activity Tolerance Patient tolerated treatment well    Behavior During Therapy [facility] for tasks assessed/performed           Past Medical History:  Diagnosis Date  . ANGIOMA 10/03/2008   REMOVED FROM RIGHT LOWER LEG-BENIGN  . Anxiety   . Arthritis    RA  . Bronchitis    hx of   . Cancer (HCC)    BASAL CELL SKIN CANCER  . Complication of anesthesia   . Fibromyalgia   . GERD 08/27/2008  . History of blood transfusion    1995  . History of hiatal hernia    hx of has had surgically repaired  . History of measles   . History of mumps   . History of nonmelanoma skin cancer   . HYPERLIPIDEMIA 08/27/2008  . HYPERTENSION 08/27/2008   pt is currently on no meds   . Pneumonia    hx of   . PONV (postoperative nausea and vomiting)   . Rheumatoid arthritis ([place])   . SEBORRHEIC KERATOSIS 08/27/2008    Past Surgical History:  Procedure Laterality Date  . ABDOMINAL HYSTERECTOMY  1995  . APPENDECTOMY    . BREAST BIOPSY Left   . BREAST DUCTAL SYSTEM EXCISION  08/16/2011   Procedure: EXCISION DUCTAL SYSTEM BREAST;  Surgeon: Joyice Faster. Cornett, MD;  Location: [place];  Service: General;  Laterality: Left;  left breast duct excision  . [place]   times 2; 1980 and 1982  .  [place]  . CHOLECYSTECTOMY  1997  . EXCISION/RELEASE BURSA HIP Right 10/21/2014   Procedure: OPEN RIGHT HIP BURSECTOMY, EXOSECTOMY;  Surgeon: Paralee Cancel, MD;  [facility];  Service: Orthopedics;  Laterality: Right;  . foot surgery  2004, 2005, 2008   bilat secondary to neuropathy   . JOINT REPLACEMENT  10,12   lt total knee X2  . KNEE ARTHROSCOPY  2007, 2008, 2009  . LAPAROSCOPIC GASTRIC SLEEVE RESECTION N/A 10/30/2013   Procedure: LAPAROSCOPIC GASTRIC SLEEVE RESECTION WITH HIATAL HERNIA REPAIR AND UPPER ENDOSCOPY;  Surgeon: Greer Pickerel, MD;  [facility];  Service: General;  Laterality: N/A;  . NASAL SINUS SURGERY  2013  . OPEN SURGICAL REPAIR OF GLUTEAL TENDON  01/17/2012   Procedure: OPEN SURGICAL REPAIR OF GLUTEAL TENDON;  Surgeon: Mauri Pole, MD;  [facility];  Service: Orthopedics;  Laterality: Right;    There were no vitals filed for this visit.       [facility] PT Assessment - 09/20/19 0001      PROM   PROM Assessment Site Shoulder    Right/Left Shoulder Right    Right Shoulder Flexion 145 Degrees    Right Shoulder External Rotation  60 Degrees                         [facility] Adult PT Treatment/Exercise - 09/20/19 0001      Exercises   Exercises Shoulder      Shoulder Exercises: Supine   External Rotation AAROM;Right;20 reps   w cane   Flexion AAROM;Both;20 reps   w cane     Shoulder Exercises: Seated   Other Seated Exercises UE ranger: flexion, scaption, circles, counterclock wise circles each x 20 reps      Shoulder Exercises: Pulleys   Flexion 5 minutes      Modalities   Modalities Electrical Stimulation      Electrical Stimulation   Electrical Stimulation Location shoulder    Electrical Stimulation Action Pre-mod    Electrical Stimulation Parameters 80-150 Hz x 10 minutes    Electrical Stimulation Goals Tone;Pain      Vasopneumatic   Number Minutes Vasopneumatic  10 minutes    Vasopnuematic Location  Shoulder      Vasopneumatic Pressure Low    Vasopneumatic Temperature   34      Manual Therapy   Manual Therapy Passive ROM    Manual therapy comments oscillations and rhythmic stabilization performed    Passive ROM Right shoulder PROM into flexion, ABD and ER to improve ROM                       PT Long Term Goals - 09/20/19 1238      PT LONG TERM GOAL #1   Title Patient will be independent with HEP    Time 4    Period Weeks    Status On-going      PT LONG TERM GOAL #2   Title Patient will demonstate 140+ degrees of right shoulder flexion AROM to improve ability to perform overhead tasks    Status On-going      PT LONG TERM GOAL #3   Title Patient will demonstrate 60+ degrees of right shoulder ER AROM to improve donning and doffing apparel.    Status On-going      PT LONG TERM GOAL #4   Title Patient will demonstrate 4/5 or greater right shoulder MMT in all planes to improve stability during functional tasks.    Status On-going      PT LONG TERM GOAL #5   Title Patient will report ability to perform ADLs and gardening activities with right shoulder pain less than or equal to 3/10.    Status On-going                 Plan - 09/20/19 1231    Clinical Impression Statement Pt arrving today reporting compliance with her HEP. Pt has improved in her PROM R shoulder flexion to 145 degrees, ER to 60 degrees. Pt still reporting pain at end ranages with resistance noted. Pt reporting less pain following Vaso and E-stim. Continue skilled PT.    Personal Factors and Comorbidities Comorbidity 3+    Comorbidities right SAD, DCR, RTC repair 07/13/2019, HTN, RA, Osteoporosis    Examination-Activity Limitations Bathing;Sleep;Hygiene/Grooming;Dressing;Lift;Carry;Caring for Others;Toileting    Stability/Clinical Decision Making Stable/Uncomplicated    Rehab Potential Excellent    PT Frequency 2x / week    PT Duration 6 weeks    PT Treatment/Interventions Cryotherapy;Electrical  Stimulation;ADLs/Self Care Home Management;Iontophoresis 4mg /ml Dexamethasone;Moist Heat;Ultrasound;Therapeutic activities;Therapeutic exercise;Neuromuscular re-education;Manual techniques;Passive range of motion;Patient/family education;Vasopneumatic Device;Taping    PT Next Visit Plan cont with  POC per protocol (9 week 09/14/19) PROM/AAROM and progress Isomrtrics next visit with HEP    PT Home Exercise Plan see patient education section    Consulted and Agree with Plan of Care Patient           Patient will benefit from skilled therapeutic intervention in order to improve the following deficits and impairments:  Decreased activity tolerance, Decreased strength, Decreased range of motion, Impaired UE functional use, Pain  Visit Diagnosis: Stiffness of right shoulder, not elsewhere classified  Acute pain of right shoulder  Muscle weakness (generalized)  Localized edema     Problem List Patient Active Problem List   Diagnosis Date Noted  . Bilateral occipital neuralgia 09/26/2017  . Status post left foot surgery 07/07/2015  . Neuroma 05/20/2015  . HAV (hallux abducto valgus) 05/20/2015  . Hammertoe 05/20/2015  . Prominent metatarsal head 05/20/2015  . Chronic foot pain 05/20/2015  . Anxiety state 03/03/2015  . Hip bursitis 10/22/2014  . Osteophyte of hip 10/21/2014  . Epigastric cramping 11/09/2013  . Nausea with vomiting 11/06/2013  . Rheumatoid arthritis ([place]) 11/01/2013  . S/P laparoscopic sleeve gastrectomy 10/30/2013  . Anemia 06/04/2013  . CAP (community acquired pneumonia) 02/17/2013  . Restless leg syndrome 09/18/2012  . Morbid obesity with BMI of 45.0-49.9, adult ([place]) 01/18/2012  . Right gluteus tear 01/17/2012  . Nipple discharge in female 07/08/2011  . Chronic insomnia 06/18/2011  . Discharge from nipple 01/06/2011  . TMJ PAIN 01/12/2010  . DEPRESSION 12/10/2009  . SINUSITIS, ACUTE 12/30/2008  . Lumbago 12/30/2008  . EDEMA 10/03/2008  . Hyperlipidemia  08/27/2008  . Essential hypertension 08/27/2008  . GERD 08/27/2008    Oretha Caprice PT, MPT 09/20/2019, 12:42 PM  [facility] [place], [place], [address] Phone: [phone]   Fax:  [phone]  Name: [name] MRN: [id number] Date of Birth: 11-28-1955

## 2019-09-25 ENCOUNTER — Other Ambulatory Visit: Payer: Self-pay

## 2019-09-25 ENCOUNTER — Ambulatory Visit: Payer: Medicare Other | Attending: Orthopedic Surgery | Admitting: Physical Therapy

## 2019-09-25 DIAGNOSIS — M6281 Muscle weakness (generalized): Secondary | ICD-10-CM | POA: Insufficient documentation

## 2019-09-25 DIAGNOSIS — R600 Localized edema: Secondary | ICD-10-CM | POA: Insufficient documentation

## 2019-09-25 DIAGNOSIS — M25511 Pain in right shoulder: Secondary | ICD-10-CM | POA: Diagnosis not present

## 2019-09-25 DIAGNOSIS — M25611 Stiffness of right shoulder, not elsewhere classified: Secondary | ICD-10-CM

## 2019-09-25 NOTE — Therapy (Signed)
[facility] [place], [place], [address] Phone: [phone]   Fax:  [phone]  Physical Therapy Treatment  Patient Details  Name: [name] MRN: [id number] Date of Birth: August 28, 1955 Referring Provider (PT): Justice Britain, MD   Encounter Date: 09/25/2019   PT End of Session - 09/25/19 1619    Visit Number 9    Number of Visits 17    Date for PT Re-Evaluation 08/24/19    Authorization Type FOTO (Initial: 77% limitation), progress note every 10th visit, KX modifier after 15th visit    PT Start Time 0315    PT Stop Time 0404    PT Time Calculation (min) 49 min    Activity Tolerance Patient tolerated treatment well    Behavior During Therapy [facility] for tasks assessed/performed           Past Medical History:  Diagnosis Date  . ANGIOMA 10/03/2008   REMOVED FROM RIGHT LOWER LEG-BENIGN  . Anxiety   . Arthritis    RA  . Bronchitis    hx of   . Cancer (HCC)    BASAL CELL SKIN CANCER  . Complication of anesthesia   . Fibromyalgia   . GERD 08/27/2008  . History of blood transfusion    1995  . History of hiatal hernia    hx of has had surgically repaired  . History of measles   . History of mumps   . History of nonmelanoma skin cancer   . HYPERLIPIDEMIA 08/27/2008  . HYPERTENSION 08/27/2008   pt is currently on no meds   . Pneumonia    hx of   . PONV (postoperative nausea and vomiting)   . Rheumatoid arthritis ([place])   . SEBORRHEIC KERATOSIS 08/27/2008    Past Surgical History:  Procedure Laterality Date  . ABDOMINAL HYSTERECTOMY  1995  . APPENDECTOMY    . BREAST BIOPSY Left   . BREAST DUCTAL SYSTEM EXCISION  08/16/2011   Procedure: EXCISION DUCTAL SYSTEM BREAST;  Surgeon: Joyice Faster. Cornett, MD;  Location: [place];  Service: General;  Laterality: Left;  left breast duct excision  . [place]   times 2; 1980 and 1982  . [place]  . CHOLECYSTECTOMY  1997  .  EXCISION/RELEASE BURSA HIP Right 10/21/2014   Procedure: OPEN RIGHT HIP BURSECTOMY, EXOSECTOMY;  Surgeon: Paralee Cancel, MD;  Location: [facility];  Service: Orthopedics;  Laterality: Right;  . foot surgery  2004, 2005, 2008   bilat secondary to neuropathy   . JOINT REPLACEMENT  10,12   lt total knee X2  . KNEE ARTHROSCOPY  2007, 2008, 2009  . LAPAROSCOPIC GASTRIC SLEEVE RESECTION N/A 10/30/2013   Procedure: LAPAROSCOPIC GASTRIC SLEEVE RESECTION WITH HIATAL HERNIA REPAIR AND UPPER ENDOSCOPY;  Surgeon: Greer Pickerel, MD;  Location: [facility];  Service: General;  Laterality: N/A;  . NASAL SINUS SURGERY  2013  . OPEN SURGICAL REPAIR OF GLUTEAL TENDON  01/17/2012   Procedure: OPEN SURGICAL REPAIR OF GLUTEAL TENDON;  Surgeon: Mauri Pole, MD;  Location: [facility];  Service: Orthopedics;  Laterality: Right;    There were no vitals filed for this visit.   Subjective Assessment - 09/25/19 1615    Subjective COVID-19 screen performed prior to patient entering clinic.  No new complaints.    Pertinent History right SAD, DCR, RTC repair 07/13/2019, HTN, RA, Osteoporosis    Limitations Lifting;House hold activities    Patient Stated Goals improve movement, use arm  normally    Currently in Pain? Yes    Pain Score 2     Pain Location Shoulder    Pain Orientation Right    Pain Descriptors / Indicators Sore    Pain Type Surgical pain    Pain Onset More than a month ago                             [facility] Adult PT Treatment/Exercise - 09/25/19 0001      Exercises   Exercises Shoulder      Shoulder Exercises: Pulleys   Flexion 5 minutes    Other Pulley Exercises Seated UE ranger x 5 minutes.    Other Pulley Exercises Wall ladder x 5 minutes.      Modalities   Modalities Vasopneumatic      Vasopneumatic   Number Minutes Vasopneumatic  15 minutes    Vasopnuematic Location  --   RT shoulder with pillow btw elbow and thorax.   Vasopneumatic Pressure Low      Manual Therapy   Manual  Therapy Passive ROM    Passive ROM In supine:  PROM x 8 minutes to patient's right shoulder.                       PT Long Term Goals - 09/20/19 1238      PT LONG TERM GOAL #1   Title Patient will be independent with HEP    Time 4    Period Weeks    Status On-going      PT LONG TERM GOAL #2   Title Patient will demonstate 140+ degrees of right shoulder flexion AROM to improve ability to perform overhead tasks    Status On-going      PT LONG TERM GOAL #3   Title Patient will demonstrate 60+ degrees of right shoulder ER AROM to improve donning and doffing apparel.    Status On-going      PT LONG TERM GOAL #4   Title Patient will demonstrate 4/5 or greater right shoulder MMT in all planes to improve stability during functional tasks.    Status On-going      PT LONG TERM GOAL #5   Title Patient will report ability to perform ADLs and gardening activities with right shoulder pain less than or equal to 3/10.    Status On-going                 Plan - 09/25/19 1618    Clinical Impression Statement Patient did great today with the addition of the wall ladder and did so without complaint.  Her passive right shoulder range of motion has improved nicely.    Personal Factors and Comorbidities Comorbidity 3+    Comorbidities right SAD, DCR, RTC repair 07/13/2019, HTN, RA, Osteoporosis    Examination-Activity Limitations Bathing;Sleep;Hygiene/Grooming;Dressing;Lift;Carry;Caring for Others;Toileting    Stability/Clinical Decision Making Stable/Uncomplicated    Rehab Potential Excellent    PT Frequency 2x / week    PT Duration 3 weeks    PT Treatment/Interventions Cryotherapy;Electrical Stimulation;ADLs/Self Care Home Management;Iontophoresis 4mg /ml Dexamethasone;Moist Heat;Ultrasound;Therapeutic activities;Therapeutic exercise;Neuromuscular re-education;Manual techniques;Passive range of motion;Patient/family education;Vasopneumatic Device;Taping    PT Next Visit Plan cont  with POC per protocol (9 week 09/14/19) PROM/AAROM and progress [employer] next visit with HEP    PT Home Exercise Plan see patient education section    Consulted and Agree with Plan of Care Patient  Patient will benefit from skilled therapeutic intervention in order to improve the following deficits and impairments:  Decreased activity tolerance, Decreased strength, Decreased range of motion, Impaired UE functional use, Pain  Visit Diagnosis: Stiffness of right shoulder, not elsewhere classified  Acute pain of right shoulder  Muscle weakness (generalized)  Localized edema     Problem List Patient Active Problem List   Diagnosis Date Noted  . Bilateral occipital neuralgia 09/26/2017  . Status post left foot surgery 07/07/2015  . Neuroma 05/20/2015  . HAV (hallux abducto valgus) 05/20/2015  . Hammertoe 05/20/2015  . Prominent metatarsal head 05/20/2015  . Chronic foot pain 05/20/2015  . Anxiety state 03/03/2015  . Hip bursitis 10/22/2014  . Osteophyte of hip 10/21/2014  . Epigastric cramping 11/09/2013  . Nausea with vomiting 11/06/2013  . Rheumatoid arthritis ([place]) 11/01/2013  . S/P laparoscopic sleeve gastrectomy 10/30/2013  . Anemia 06/04/2013  . CAP (community acquired pneumonia) 02/17/2013  . Restless leg syndrome 09/18/2012  . Morbid obesity with BMI of 45.0-49.9, adult ([place]) 01/18/2012  . Right gluteus tear 01/17/2012  . Nipple discharge in female 07/08/2011  . Chronic insomnia 06/18/2011  . Discharge from nipple 01/06/2011  . TMJ PAIN 01/12/2010  . DEPRESSION 12/10/2009  . SINUSITIS, ACUTE 12/30/2008  . Lumbago 12/30/2008  . EDEMA 10/03/2008  . Hyperlipidemia 08/27/2008  . Essential hypertension 08/27/2008  . GERD 08/27/2008    Jariel Drost, [place] MPT 09/25/2019, 4:22 PM  [facility] [address] [place], [place], [address] Phone: [phone]   Fax:  [phone]  Name: [name] MRN:  [id number] Date of Birth: May 07, 1955

## 2019-09-27 ENCOUNTER — Other Ambulatory Visit: Payer: Self-pay

## 2019-09-27 ENCOUNTER — Encounter: Payer: Self-pay | Admitting: Physical Therapy

## 2019-09-27 ENCOUNTER — Ambulatory Visit: Payer: Medicare Other | Admitting: Physical Therapy

## 2019-09-27 DIAGNOSIS — R600 Localized edema: Secondary | ICD-10-CM

## 2019-09-27 DIAGNOSIS — M6281 Muscle weakness (generalized): Secondary | ICD-10-CM

## 2019-09-27 DIAGNOSIS — M25611 Stiffness of right shoulder, not elsewhere classified: Secondary | ICD-10-CM

## 2019-09-27 DIAGNOSIS — M25511 Pain in right shoulder: Secondary | ICD-10-CM

## 2019-09-27 NOTE — Therapy (Signed)
[facility] [place], [place], [address] Phone: [phone]   Fax:  [phone]  Physical Therapy Treatment  Patient Details  Name: [name] MRN: [id number] Date of Birth: Jul 08, 1955 Referring Provider (PT): Justice Britain, MD   Encounter Date: 09/27/2019   PT End of Session - 09/27/19 1244    Visit Number 10    Number of Visits 17    Date for PT Re-Evaluation 11/02/19    Authorization Type FOTO (Initial: 77% limitation), progress note every 10th visit, KX modifier after 15th visit    PT Start Time 1115    PT Stop Time 1206    PT Time Calculation (min) 51 min    Activity Tolerance Patient tolerated treatment well    Behavior During Therapy [facility] for tasks assessed/performed           Past Medical History:  Diagnosis Date  . ANGIOMA 10/03/2008   REMOVED FROM RIGHT LOWER LEG-BENIGN  . Anxiety   . Arthritis    RA  . Bronchitis    hx of   . Cancer (HCC)    BASAL CELL SKIN CANCER  . Complication of anesthesia   . Fibromyalgia   . GERD 08/27/2008  . History of blood transfusion    1995  . History of hiatal hernia    hx of has had surgically repaired  . History of measles   . History of mumps   . History of nonmelanoma skin cancer   . HYPERLIPIDEMIA 08/27/2008  . HYPERTENSION 08/27/2008   pt is currently on no meds   . Pneumonia    hx of   . PONV (postoperative nausea and vomiting)   . Rheumatoid arthritis ([place])   . SEBORRHEIC KERATOSIS 08/27/2008    Past Surgical History:  Procedure Laterality Date  . ABDOMINAL HYSTERECTOMY  1995  . APPENDECTOMY    . BREAST BIOPSY Left   . BREAST DUCTAL SYSTEM EXCISION  08/16/2011   Procedure: EXCISION DUCTAL SYSTEM BREAST;  Surgeon: Joyice Faster. Cornett, MD;  Location: [facility];  Service: General;  Laterality: Left;  left breast duct excision  . [place]   times 2; 1980 and 1982  . [place]  . CHOLECYSTECTOMY  1997  .  EXCISION/RELEASE BURSA HIP Right 10/21/2014   Procedure: OPEN RIGHT HIP BURSECTOMY, EXOSECTOMY;  Surgeon: Paralee Cancel, MD;  [facility];  Service: Orthopedics;  Laterality: Right;  . foot surgery  2004, 2005, 2008   bilat secondary to neuropathy   . JOINT REPLACEMENT  10,12   lt total knee X2  . KNEE ARTHROSCOPY  2007, 2008, 2009  . LAPAROSCOPIC GASTRIC SLEEVE RESECTION N/A 10/30/2013   Procedure: LAPAROSCOPIC GASTRIC SLEEVE RESECTION WITH HIATAL HERNIA REPAIR AND UPPER ENDOSCOPY;  Surgeon: Greer Pickerel, MD;  [facility];  Service: General;  Laterality: N/A;  . NASAL SINUS SURGERY  2013  . OPEN SURGICAL REPAIR OF GLUTEAL TENDON  01/17/2012   Procedure: OPEN SURGICAL REPAIR OF GLUTEAL TENDON;  Surgeon: Mauri Pole, MD;  [facility];  Service: Orthopedics;  Laterality: Right;    There were no vitals filed for this visit.   Subjective Assessment - 09/27/19 1249    Subjective COVID-19 screen performed prior to patient entering clinic.  Patient reports doing well with  no reports of pain.    Pertinent History right SAD, DCR, RTC repair 07/13/2019, HTN, RA, Osteoporosis    Limitations Lifting;House hold activities  Patient Stated Goals improve movement, use arm normally    Currently in Pain? No/denies              [facility] PT Assessment - 09/27/19 0001      Assessment   Medical Diagnosis Impingement syndrome of right shoulder region    Referring Provider (PT) Justice Britain, MD    Onset Date/Surgical Date 07/13/19    Hand Dominance Right    Next MD Visit 10/16/2019      PROM   PROM Assessment Site Shoulder    Right/Left Shoulder Right    Right Shoulder Flexion 155 Degrees    Right Shoulder Internal Rotation 66 Degrees    Right Shoulder External Rotation 65 Degrees                         [facility] Adult PT Treatment/Exercise - 09/27/19 0001      Exercises   Exercises Shoulder      Shoulder Exercises: Pulleys   Flexion 5 minutes      Shoulder  Exercises: ROM/Strengthening   UBE (Upper Arm Bike) 120 RPM x6 minutes (3 fwd, 3 bwd)    Ranger standing UE ranger, flexion, CW, CCW circles x2 mins each      Modalities   Modalities Vasopneumatic      Vasopneumatic   Number Minutes Vasopneumatic  10 minutes    Vasopnuematic Location  Shoulder    Vasopneumatic Pressure Low    Vasopneumatic Temperature  34      Manual Therapy   Manual Therapy Passive ROM    Passive ROM R shoulder PROM into flexion ER and IR to improve ROM; intermittent oscillations to decrease muscle guarding                       PT Long Term Goals - 09/20/19 1238      PT LONG TERM GOAL #1   Title Patient will be independent with HEP    Time 4    Period Weeks    Status On-going      PT LONG TERM GOAL #2   Title Patient will demonstate 140+ degrees of right shoulder flexion AROM to improve ability to perform overhead tasks    Status On-going      PT LONG TERM GOAL #3   Title Patient will demonstrate 60+ degrees of right shoulder ER AROM to improve donning and doffing apparel.    Status On-going      PT LONG TERM GOAL #4   Title Patient will demonstrate 4/5 or greater right shoulder MMT in all planes to improve stability during functional tasks.    Status On-going      PT LONG TERM GOAL #5   Title Patient will report ability to perform ADLs and gardening activities with right shoulder pain less than or equal to 3/10.    Status On-going                 Plan - 09/27/19 1244    Clinical Impression Statement Patient is 10 week post op and was able to complete treatment with no reports of increased pain, just fatigue. Smooth arcs of motion noted with PROM and improvements since last assessment. Goals ongoing at this time. Normal response to modalities upon removal.    Personal Factors and Comorbidities Comorbidity 3+    Comorbidities right SAD, DCR, RTC repair 07/13/2019, HTN, RA, Osteoporosis    Examination-Activity Limitations  Bathing;Sleep;Hygiene/Grooming;Dressing;Lift;Carry;Caring for Others;Toileting  Stability/Clinical Decision Making Stable/Uncomplicated    Clinical Decision Making Low    Rehab Potential Excellent    PT Frequency 2x / week    PT Duration 3 weeks    PT Treatment/Interventions Cryotherapy;Electrical Stimulation;ADLs/Self Care Home Management;Iontophoresis 4mg /ml Dexamethasone;Moist Heat;Ultrasound;Therapeutic activities;Therapeutic exercise;Neuromuscular re-education;Manual techniques;Passive range of motion;Patient/family education;Vasopneumatic Device;Taping    PT Next Visit Plan continue per protocol 12 weeks 10/05/2019; AAROM to right shoulder per tolerance. modalities PRN for pain relief.    PT Home Exercise Plan see patient education section    Consulted and Agree with Plan of Care Patient    Family Member Consulted Husband           Patient will benefit from skilled therapeutic intervention in order to improve the following deficits and impairments:  Decreased activity tolerance, Decreased strength, Decreased range of motion, Impaired UE functional use, Pain  Visit Diagnosis: Stiffness of right shoulder, not elsewhere classified  Muscle weakness (generalized)  Acute pain of right shoulder  Localized edema     Problem List Patient Active Problem List   Diagnosis Date Noted  . Bilateral occipital neuralgia 09/26/2017  . Status post left foot surgery 07/07/2015  . Neuroma 05/20/2015  . HAV (hallux abducto valgus) 05/20/2015  . Hammertoe 05/20/2015  . Prominent metatarsal head 05/20/2015  . Chronic foot pain 05/20/2015  . Anxiety state 03/03/2015  . Hip bursitis 10/22/2014  . Osteophyte of hip 10/21/2014  . Epigastric cramping 11/09/2013  . Nausea with vomiting 11/06/2013  . Rheumatoid arthritis ([place]) 11/01/2013  . S/P laparoscopic sleeve gastrectomy 10/30/2013  . Anemia 06/04/2013  . CAP (community acquired pneumonia) 02/17/2013  . Restless leg syndrome 09/18/2012   . Morbid obesity with BMI of 45.0-49.9, adult ([place]) 01/18/2012  . Right gluteus tear 01/17/2012  . Nipple discharge in female 07/08/2011  . Chronic insomnia 06/18/2011  . Discharge from nipple 01/06/2011  . TMJ PAIN 01/12/2010  . DEPRESSION 12/10/2009  . SINUSITIS, ACUTE 12/30/2008  . Lumbago 12/30/2008  . EDEMA 10/03/2008  . Hyperlipidemia 08/27/2008  . Essential hypertension 08/27/2008  . GERD 08/27/2008    Gabriela Eves, PT, DPT 09/27/2019, 12:50 PM  [facility] [address] [place], [place], [address] Phone: [phone]   Fax:  [phone]  Name: [name] MRN: [id number] Date of Birth: 1955/10/21

## 2019-10-01 DIAGNOSIS — M25512 Pain in left shoulder: Secondary | ICD-10-CM | POA: Diagnosis not present

## 2019-10-02 ENCOUNTER — Ambulatory Visit: Payer: Medicare Other | Admitting: Physical Therapy

## 2019-10-02 ENCOUNTER — Other Ambulatory Visit: Payer: Self-pay | Admitting: Family Medicine

## 2019-10-02 ENCOUNTER — Encounter: Payer: Self-pay | Admitting: Physical Therapy

## 2019-10-02 ENCOUNTER — Other Ambulatory Visit: Payer: Self-pay

## 2019-10-02 DIAGNOSIS — M25511 Pain in right shoulder: Secondary | ICD-10-CM

## 2019-10-02 DIAGNOSIS — Z1231 Encounter for screening mammogram for malignant neoplasm of breast: Secondary | ICD-10-CM

## 2019-10-02 DIAGNOSIS — M25611 Stiffness of right shoulder, not elsewhere classified: Secondary | ICD-10-CM | POA: Diagnosis not present

## 2019-10-02 DIAGNOSIS — M6281 Muscle weakness (generalized): Secondary | ICD-10-CM | POA: Diagnosis not present

## 2019-10-02 DIAGNOSIS — R600 Localized edema: Secondary | ICD-10-CM

## 2019-10-02 NOTE — Therapy (Signed)
[facility] [place], [place], [address] Phone: [phone]   Fax:  [phone]  Physical Therapy Treatment  Patient Details  Name: [name] MRN: [id number] Date of Birth: [birth year] Referring Provider (PT): Justice Britain, MD   Encounter Date: 10/02/2019   PT End of Session - 10/02/19 1041    Visit Number 11    Number of Visits 17    Date for PT Re-Evaluation 11/02/19    Authorization Type FOTO (Initial: 77% limitation), progress note every 10th visit, KX modifier after 15th visit    Progress Note Due on Visit 20    PT Start Time 1030    PT Stop Time 1115    PT Time Calculation (min) 45 min    Equipment Utilized During Treatment Other (comment)    Activity Tolerance Patient tolerated treatment well           Past Medical History:  Diagnosis Date  . ANGIOMA 10/03/2008   REMOVED FROM RIGHT LOWER LEG-BENIGN  . Anxiety   . Arthritis    RA  . Bronchitis    hx of   . Cancer (HCC)    BASAL CELL SKIN CANCER  . Complication of anesthesia   . Fibromyalgia   . GERD 08/27/2008  . History of blood transfusion    1995  . History of hiatal hernia    hx of has had surgically repaired  . History of measles   . History of mumps   . History of nonmelanoma skin cancer   . HYPERLIPIDEMIA 08/27/2008  . HYPERTENSION 08/27/2008   pt is currently on no meds   . Pneumonia    hx of   . PONV (postoperative nausea and vomiting)   . Rheumatoid arthritis ([place])   . SEBORRHEIC KERATOSIS 08/27/2008    Past Surgical History:  Procedure Laterality Date  . ABDOMINAL HYSTERECTOMY  1995  . APPENDECTOMY    . BREAST BIOPSY Left   . BREAST DUCTAL SYSTEM EXCISION  08/16/2011   Procedure: EXCISION DUCTAL SYSTEM BREAST;  Surgeon: Joyice Faster. Cornett, MD;  Location: [facility];  Service: General;  Laterality: Left;  left breast duct excision  . [place]   times 2; 1980 and 1982  . [place]  .  CHOLECYSTECTOMY  1997  . EXCISION/RELEASE BURSA HIP Right 10/21/2014   Procedure: OPEN RIGHT HIP BURSECTOMY, EXOSECTOMY;  Surgeon: Paralee Cancel, MD;  Location: [facility];  Service: Orthopedics;  Laterality: Right;  . foot surgery  2004, 2005, 2008   bilat secondary to neuropathy   . JOINT REPLACEMENT  10,12   lt total knee X2  . KNEE ARTHROSCOPY  2007, 2008, 2009  . LAPAROSCOPIC GASTRIC SLEEVE RESECTION N/A 10/30/2013   Procedure: LAPAROSCOPIC GASTRIC SLEEVE RESECTION WITH HIATAL HERNIA REPAIR AND UPPER ENDOSCOPY;  Surgeon: Greer Pickerel, MD;  Location: [facility];  Service: General;  Laterality: N/A;  . NASAL SINUS SURGERY  2013  . OPEN SURGICAL REPAIR OF GLUTEAL TENDON  01/17/2012   Procedure: OPEN SURGICAL REPAIR OF GLUTEAL TENDON;  Surgeon: Mauri Pole, MD;  Location: [facility];  Service: Orthopedics;  Laterality: Right;    There were no vitals filed for this visit.   Subjective Assessment - 10/02/19 1036    Subjective COVID-19 screen performed prior to patient entering clinic.  Patient reporting 4/10 pain in R shoulder    Pertinent History right SAD, DCR, RTC repair 07/13/2019, HTN, RA, Osteoporosis    Limitations Lifting;House  hold activities    Patient Stated Goals improve movement, use arm normally    Currently in Pain? Yes    Pain Score 4     Pain Location Shoulder    Pain Orientation Right    Pain Descriptors / Indicators Sore    Pain Type Surgical pain    Pain Onset More than a month ago    Pain Frequency Intermittent              [facility] PT Assessment - 10/02/19 0001      Assessment   Medical Diagnosis Impingement syndrome of right shoulder region    Referring Provider (PT) Justice Britain, MD    Onset Date/Surgical Date 07/13/19    Hand Dominance Right    Next MD Visit 10/16/2019      PROM   Right/Left Shoulder Right    Right Shoulder Flexion 145 Degrees    Right Shoulder External Rotation 60 Degrees                         [facility] Adult PT Treatment/Exercise  - 10/02/19 0001      Exercises   Exercises Shoulder      Shoulder Exercises: Standing   Other Standing Exercises scapular retraction x 10 holding 5 seconds, cervical retraction x 5 holding 5 seconds, upper trap stretch x 2 each side holding 10 seconds      Shoulder Exercises: Pulleys   Flexion 5 minutes    Other Pulley Exercises UE ranger, flexion, scaption, circles both directions each for 1 minute    Other Pulley Exercises wall ladder x 2 minutes (#23)      Shoulder Exercises: ROM/Strengthening   UBE (Upper Arm Bike) 120 RPM x  minutes (3 fwd, 3 bwd)    Other ROM/Strengthening Exercises wall slides x 10 using towel      Modalities   Modalities Vasopneumatic      Vasopneumatic   Number Minutes Vasopneumatic  10 minutes    Vasopnuematic Location  Shoulder    Vasopneumatic Pressure Low    Vasopneumatic Temperature  34      Manual Therapy   Manual Therapy Passive ROM    Passive ROM R shoulder PROM into flexion ER and IR to improve ROM; intermittent oscillations to decrease muscle guarding                       PT Long Term Goals - 10/02/19 1047      PT LONG TERM GOAL #1   Title Patient will be independent with HEP    Status On-going      PT LONG TERM GOAL #2   Title Patient will demonstate 140+ degrees of right shoulder flexion AROM to improve ability to perform overhead tasks    Baseline 145 degrees passively on 10/02/2019    Status On-going      PT LONG TERM GOAL #3   Title Patient will demonstrate 60+ degrees of right shoulder ER AROM to improve donning and doffing apparel.      PT LONG TERM GOAL #4   Title Patient will demonstrate 4/5 or greater right shoulder MMT in all planes to improve stability during functional tasks.      PT LONG TERM GOAL #5   Title Patient will report ability to perform ADLs and gardening activities with right shoulder pain less than or equal to 3/10.    Status On-going  Plan - 10/02/19 1042     Clinical Impression Statement Pt is 10 weeks post -op. Pt tolerating treatment well. PROM R shoulder flexion was 145 degrees and ER was 60 degrees.  Pt reporting mild pain in her left shoulder and states she went for a MRI yesterday and she has an appointment with MD on 10/15/2019.  No goals met this session. Continue skilled PT.    Personal Factors and Comorbidities Comorbidity 3+    Comorbidities right SAD, DCR, RTC repair 07/13/2019, HTN, RA, Osteoporosis    Examination-Activity Limitations Bathing;Sleep;Hygiene/Grooming;Dressing;Lift;Carry;Caring for Others;Toileting    Stability/Clinical Decision Making Stable/Uncomplicated    Rehab Potential Excellent    PT Frequency 2x / week    PT Duration 3 weeks    PT Treatment/Interventions Cryotherapy;Electrical Stimulation;ADLs/Self Care Home Management;Iontophoresis 31m/ml Dexamethasone;Moist Heat;Ultrasound;Therapeutic activities;Therapeutic exercise;Neuromuscular re-education;Manual techniques;Passive range of motion;Patient/family education;Vasopneumatic Device;Taping    PT Next Visit Plan continue per protocol 12 weeks 10/05/2019; AAROM to right shoulder per tolerance. modalities PRN for pain relief.    PT Home Exercise Plan see patient education section    Consulted and Agree with Plan of Care Patient           Patient will benefit from skilled therapeutic intervention in order to improve the following deficits and impairments:  Decreased activity tolerance, Decreased strength, Decreased range of motion, Impaired UE functional use, Pain  Visit Diagnosis: Stiffness of right shoulder, not elsewhere classified  Muscle weakness (generalized)  Acute pain of right shoulder  Localized edema     Problem List Patient Active Problem List   Diagnosis Date Noted  . Bilateral occipital neuralgia 09/26/2017  . Status post left foot surgery 07/07/2015  . Neuroma 05/20/2015  . HAV (hallux abducto valgus) 05/20/2015  . Hammertoe 05/20/2015  .  Prominent metatarsal head 05/20/2015  . Chronic foot pain 05/20/2015  . Anxiety state 03/03/2015  . Hip bursitis 10/22/2014  . Osteophyte of hip 10/21/2014  . Epigastric cramping 11/09/2013  . Nausea with vomiting 11/06/2013  . Rheumatoid arthritis ([place] 11/01/2013  . S/P laparoscopic sleeve gastrectomy 10/30/2013  . Anemia 06/04/2013  . CAP (community acquired pneumonia) 02/17/2013  . Restless leg syndrome 09/18/2012  . Morbid obesity with BMI of 45.0-49.9, adult ([place] 01/18/2012  . Right gluteus tear 01/17/2012  . Nipple discharge in female 07/08/2011  . Chronic insomnia 06/18/2011  . Discharge from nipple 01/06/2011  . TMJ PAIN 01/12/2010  . DEPRESSION 12/10/2009  . SINUSITIS, ACUTE 12/30/2008  . Lumbago 12/30/2008  . EDEMA 10/03/2008  . Hyperlipidemia 08/27/2008  . Essential hypertension 08/27/2008  . GERD 08/27/2008    JOretha Caprice PT, MPT 10/02/2019, 11:09 AM  [facility]MEarlville N[place] [address]Phone: [phone]  Fax:  [phone] Name: SArbie ReiszMRN: [id number] of Birth: [birth year]

## 2019-10-04 ENCOUNTER — Encounter: Payer: Medicare Other | Admitting: *Deleted

## 2019-10-08 ENCOUNTER — Ambulatory Visit: Payer: Medicare Other | Admitting: Physical Therapy

## 2019-10-08 ENCOUNTER — Other Ambulatory Visit: Payer: Self-pay

## 2019-10-08 DIAGNOSIS — M25611 Stiffness of right shoulder, not elsewhere classified: Secondary | ICD-10-CM

## 2019-10-08 DIAGNOSIS — M6281 Muscle weakness (generalized): Secondary | ICD-10-CM | POA: Diagnosis not present

## 2019-10-08 DIAGNOSIS — R600 Localized edema: Secondary | ICD-10-CM | POA: Diagnosis not present

## 2019-10-08 DIAGNOSIS — M25511 Pain in right shoulder: Secondary | ICD-10-CM | POA: Diagnosis not present

## 2019-10-08 NOTE — Therapy (Signed)
[facility] [place], [place], [address] Phone: [phone]   Fax:  [phone]  Physical Therapy Treatment  Patient Details  Name: [name] MRN: [id number] Date of Birth: 1955/08/13 Referring Provider (PT): Justice Britain, MD   Encounter Date: 10/08/2019   PT End of Session - 10/08/19 1121    Visit Number 12    Number of Visits 17    Date for PT Re-Evaluation 11/02/19    Authorization Type FOTO (12th visit 36%  limitation), progress note every 10th visit, KX modifier after 15th visit    PT Start Time 1116    PT Stop Time 1201    PT Time Calculation (min) 45 min    Activity Tolerance Patient tolerated treatment well    Behavior During Therapy [facility] for tasks assessed/performed           Past Medical History:  Diagnosis Date  . ANGIOMA 10/03/2008   REMOVED FROM RIGHT LOWER LEG-BENIGN  . Anxiety   . Arthritis    RA  . Bronchitis    hx of   . Cancer (HCC)    BASAL CELL SKIN CANCER  . Complication of anesthesia   . Fibromyalgia   . GERD 08/27/2008  . History of blood transfusion    1995  . History of hiatal hernia    hx of has had surgically repaired  . History of measles   . History of mumps   . History of nonmelanoma skin cancer   . HYPERLIPIDEMIA 08/27/2008  . HYPERTENSION 08/27/2008   pt is currently on no meds   . Pneumonia    hx of   . PONV (postoperative nausea and vomiting)   . Rheumatoid arthritis ([place])   . SEBORRHEIC KERATOSIS 08/27/2008    Past Surgical History:  Procedure Laterality Date  . ABDOMINAL HYSTERECTOMY  1995  . APPENDECTOMY    . BREAST BIOPSY Left   . BREAST DUCTAL SYSTEM EXCISION  08/16/2011   Procedure: EXCISION DUCTAL SYSTEM BREAST;  Surgeon: Joyice Faster. Cornett, MD;  Location: [facility];  Service: General;  Laterality: Left;  left breast duct excision  . [place]   times 2; 1980 and 1982  . [place]  . CHOLECYSTECTOMY  1997  .  EXCISION/RELEASE BURSA HIP Right 10/21/2014   Procedure: OPEN RIGHT HIP BURSECTOMY, EXOSECTOMY;  Surgeon: Paralee Cancel, MD;  Location[facility];  Service: Orthopedics;  Laterality: Right;  . foot surgery  2004, 2005, 2008   bilat secondary to neuropathy   . JOINT REPLACEMENT  10,12   lt total knee X2  . KNEE ARTHROSCOPY  2007, 2008, 2009  . LAPAROSCOPIC GASTRIC SLEEVE RESECTION N/A 10/30/2013   Procedure: LAPAROSCOPIC GASTRIC SLEEVE RESECTION WITH HIATAL HERNIA REPAIR AND UPPER ENDOSCOPY;  Surgeon: Greer Pickerel, MD;  Location: [facility];  Service: General;  Laterality: N/A;  . NASAL SINUS SURGERY  2013  . OPEN SURGICAL REPAIR OF GLUTEAL TENDON  01/17/2012   Procedure: OPEN SURGICAL REPAIR OF GLUTEAL TENDON;  Surgeon: Mauri Pole, MD;  Location[facility];  Service: Orthopedics;  Laterality: Right;    There were no vitals filed for this visit.   Subjective Assessment - 10/08/19 1113    Subjective COVID-19 screen performed prior to patient entering clinic.  Patient arrived with little discomfort    Pertinent History right SAD, DCR, RTC repair 07/13/2019, HTN, RA, Osteoporosis    Limitations Lifting;House hold activities    Patient Stated Goals  improve movement, use arm normally    Currently in Pain? Yes    Pain Score 2     Pain Location Shoulder    Pain Orientation Right    Pain Descriptors / Indicators Sore    Pain Type Surgical pain    Pain Onset More than a month ago    Pain Frequency Intermittent    Aggravating Factors  increased activity with shoulder    Pain Relieving Factors rest              [facility] PT Assessment - 10/08/19 0001      ROM / Strength   AROM / PROM / Strength AROM;PROM      AROM   AROM Assessment Site Shoulder    Right/Left Shoulder Right    Right Shoulder Flexion 135 Degrees    Right Shoulder External Rotation 60 Degrees      PROM   PROM Assessment Site Shoulder    Right/Left Shoulder Right    Right Shoulder Flexion 150 Degrees    Right Shoulder External  Rotation 65 Degrees                         [facility] Adult PT Treatment/Exercise - 10/08/19 0001      Shoulder Exercises: Prone   Other Prone Exercises kneeling rows and ext x 20 each no weight      Shoulder Exercises: Standing   Protraction Strengthening;Right;10 reps;Theraband    Theraband Level (Shoulder Protraction) Level 1 (Yellow)    External Rotation Strengthening;Right;10 reps;Theraband    Theraband Level (Shoulder External Rotation) Level 1 (Yellow)    Internal Rotation Strengthening;Right;10 reps;Theraband    Theraband Level (Shoulder Internal Rotation) Level 1 (Yellow)    Internal Rotation Limitations some discomfort, half range    Retraction Strengthening;Right;10 reps;Theraband    Theraband Level (Shoulder Retraction) Level 1 (Yellow)      Shoulder Exercises: Pulleys   Flexion 5 minutes    Other Pulley Exercises wall ladder x43mn      Shoulder Exercises: ROM/Strengthening   Other ROM/Strengthening Exercises wall slide with eccentric lowering x10      Vasopneumatic   Number Minutes Vasopneumatic  10 minutes    Vasopnuematic Location  Shoulder    Vasopneumatic Pressure Low    Vasopneumatic Temperature  34      Manual Therapy   Manual Therapy Passive ROM    Passive ROM PROM for all motions and measurements taken, rhythmic stabs for ER/IR in scaption then fllexion/ext at 90                       PT Long Term Goals - 10/08/19 1128      PT LONG TERM GOAL #1   Title Patient will be independent with HEP    Time 4    Period Weeks    Status On-going      PT LONG TERM GOAL #2   Title Patient will demonstate 140+ degrees of right shoulder flexion AROM to improve ability to perform overhead tasks    Baseline AROM 135 degrees 10/08/19    Time 4    Period Weeks    Status On-going      PT LONG TERM GOAL #3   Title Patient will demonstrate 60+ degrees of right shoulder ER AROM to improve donning and doffing apparel.    Baseline Met AROM 60  degrees 10/08/19    Time 4    Period Weeks  Status Achieved      PT LONG TERM GOAL #4   Title Patient will demonstrate 4/5 or greater right shoulder MMT in all planes to improve stability during functional tasks.    Time 4    Period Weeks    Status On-going      PT LONG TERM GOAL #5   Title Patient will report ability to perform ADLs and gardening activities with right shoulder pain less than or equal to 3/10.    Baseline up to 5/10 with ADL's and limited with overhead movements 10/08/19    Time 4    Period Weeks    Status On-going                 Plan - 10/08/19 1153    Clinical Impression Statement Patient tolerated treatment well today. Patient reported minimal discomfort and able to perform some ADL's yet limited due to overhead movements and weakness. Today patient PROM is WNL and actively improved with both flexion and ER. Patient started strengthening activities today with some fatigue. Patient met LTG #3 with others progressing.    Personal Factors and Comorbidities Comorbidity 3+    Comorbidities right SAD, DCR, RTC repair 07/13/2019, HTN, RA, Osteoporosis    Examination-Activity Limitations Bathing;Sleep;Hygiene/Grooming;Dressing;Lift;Carry;Caring for Others;Toileting    Stability/Clinical Decision Making Stable/Uncomplicated    Rehab Potential Excellent    PT Frequency 2x / week    PT Duration 3 weeks    PT Treatment/Interventions Cryotherapy;Electrical Stimulation;ADLs/Self Care Home Management;Iontophoresis 9m/ml Dexamethasone;Moist Heat;Ultrasound;Therapeutic activities;Therapeutic exercise;Neuromuscular re-education;Manual techniques;Passive range of motion;Patient/family education;Vasopneumatic Device;Taping    PT Next Visit Plan continue per protocol 13 weeks 10/08/2019; MD F/U on 10/15/19    Consulted and Agree with Plan of Care Patient           Patient will benefit from skilled therapeutic intervention in order to improve the following deficits and  impairments:  Decreased activity tolerance, Decreased strength, Decreased range of motion, Impaired UE functional use, Pain  Visit Diagnosis: Stiffness of right shoulder, not elsewhere classified  Muscle weakness (generalized)  Acute pain of right shoulder  Localized edema     Problem List Patient Active Problem List   Diagnosis Date Noted  . Bilateral occipital neuralgia 09/26/2017  . Status post left foot surgery 07/07/2015  . Neuroma 05/20/2015  . HAV (hallux abducto valgus) 05/20/2015  . Hammertoe 05/20/2015  . Prominent metatarsal head 05/20/2015  . Chronic foot pain 05/20/2015  . Anxiety state 03/03/2015  . Hip bursitis 10/22/2014  . Osteophyte of hip 10/21/2014  . Epigastric cramping 11/09/2013  . Nausea with vomiting 11/06/2013  . Rheumatoid arthritis ([place] 11/01/2013  . S/P laparoscopic sleeve gastrectomy 10/30/2013  . Anemia 06/04/2013  . CAP (community acquired pneumonia) 02/17/2013  . Restless leg syndrome 09/18/2012  . Morbid obesity with BMI of 45.0-49.9, adult ([place] 01/18/2012  . Right gluteus tear 01/17/2012  . Nipple discharge in female 07/08/2011  . Chronic insomnia 06/18/2011  . Discharge from nipple 01/06/2011  . TMJ PAIN 01/12/2010  . DEPRESSION 12/10/2009  . SINUSITIS, ACUTE 12/30/2008  . Lumbago 12/30/2008  . EDEMA 10/03/2008  . Hyperlipidemia 08/27/2008  . Essential hypertension 08/27/2008  . GERD 08/27/2008    DPhillips Climes PTA 10/08/2019, 12:05 PM  [facility] [address] [address]Phone: [phone]  Fax:  [phone] Name: SGennett GarciaMRN: [id number] of Birth: 109/22/1957

## 2019-10-09 DIAGNOSIS — R918 Other nonspecific abnormal finding of lung field: Secondary | ICD-10-CM | POA: Diagnosis not present

## 2019-10-09 DIAGNOSIS — K449 Diaphragmatic hernia without obstruction or gangrene: Secondary | ICD-10-CM | POA: Diagnosis not present

## 2019-10-09 DIAGNOSIS — M069 Rheumatoid arthritis, unspecified: Secondary | ICD-10-CM | POA: Diagnosis not present

## 2019-10-09 DIAGNOSIS — J385 Laryngeal spasm: Secondary | ICD-10-CM | POA: Diagnosis not present

## 2019-10-09 DIAGNOSIS — J3801 Paralysis of vocal cords and larynx, unilateral: Secondary | ICD-10-CM | POA: Diagnosis not present

## 2019-10-09 DIAGNOSIS — Z885 Allergy status to narcotic agent status: Secondary | ICD-10-CM | POA: Diagnosis not present

## 2019-10-09 DIAGNOSIS — Z87891 Personal history of nicotine dependence: Secondary | ICD-10-CM | POA: Diagnosis not present

## 2019-10-09 DIAGNOSIS — Z880 Allergy status to penicillin: Secondary | ICD-10-CM | POA: Diagnosis not present

## 2019-10-09 DIAGNOSIS — K219 Gastro-esophageal reflux disease without esophagitis: Secondary | ICD-10-CM | POA: Diagnosis not present

## 2019-10-09 DIAGNOSIS — J383 Other diseases of vocal cords: Secondary | ICD-10-CM | POA: Diagnosis not present

## 2019-10-09 DIAGNOSIS — R490 Dysphonia: Secondary | ICD-10-CM | POA: Diagnosis not present

## 2019-10-09 DIAGNOSIS — Z881 Allergy status to other antibiotic agents status: Secondary | ICD-10-CM | POA: Diagnosis not present

## 2019-10-11 ENCOUNTER — Other Ambulatory Visit: Payer: Self-pay

## 2019-10-11 ENCOUNTER — Ambulatory Visit: Payer: Medicare Other | Admitting: Physical Therapy

## 2019-10-11 DIAGNOSIS — M6281 Muscle weakness (generalized): Secondary | ICD-10-CM | POA: Diagnosis not present

## 2019-10-11 DIAGNOSIS — M25611 Stiffness of right shoulder, not elsewhere classified: Secondary | ICD-10-CM

## 2019-10-11 DIAGNOSIS — R600 Localized edema: Secondary | ICD-10-CM

## 2019-10-11 DIAGNOSIS — M25511 Pain in right shoulder: Secondary | ICD-10-CM | POA: Diagnosis not present

## 2019-10-11 NOTE — Therapy (Addendum)
[facility] [place], [place], [address] Phone: [phone]   Fax:  [phone]  Physical Therapy Treatment   PHYSICAL THERAPY DISCHARGE SUMMARY  Visits from Start of Care: 13  Current functional level related to goals / functional outcomes: See below   Remaining deficits: See goals   Education / Equipment: HEP Plan: Patient agrees to discharge.  Patient goals were partially met. Patient is being discharged due to not returning since the last visit.  ?????  Gabriela Eves, PT, DPT   Patient Details  Name: [name] MRN: [id number] Date of Birth: 1955-08-19 Referring Provider (PT): Justice Britain, MD   Encounter Date: 10/11/2019   PT End of Session - 10/11/19 1122    Visit Number 13    Number of Visits 17    Date for PT Re-Evaluation 11/02/19    Authorization Type FOTO (12th visit 36%  limitation), progress note every 10th visit, KX modifier after 15th visit    PT Start Time 1115    PT Stop Time 1152    PT Time Calculation (min) 37 min    Activity Tolerance Patient tolerated treatment well    Behavior During Therapy [facility] for tasks assessed/performed           Past Medical History:  Diagnosis Date  . ANGIOMA 10/03/2008   REMOVED FROM RIGHT LOWER LEG-BENIGN  . Anxiety   . Arthritis    RA  . Bronchitis    hx of   . Cancer (HCC)    BASAL CELL SKIN CANCER  . Complication of anesthesia   . Fibromyalgia   . GERD 08/27/2008  . History of blood transfusion    1995  . History of hiatal hernia    hx of has had surgically repaired  . History of measles   . History of mumps   . History of nonmelanoma skin cancer   . HYPERLIPIDEMIA 08/27/2008  . HYPERTENSION 08/27/2008   pt is currently on no meds   . Pneumonia    hx of   . PONV (postoperative nausea and vomiting)   . Rheumatoid arthritis ([place])   . SEBORRHEIC KERATOSIS 08/27/2008    Past Surgical History:  Procedure Laterality Date  . ABDOMINAL  HYSTERECTOMY  1995  . APPENDECTOMY    . BREAST BIOPSY Left   . BREAST DUCTAL SYSTEM EXCISION  08/16/2011   Procedure: EXCISION DUCTAL SYSTEM BREAST;  Surgeon: Joyice Faster. Cornett, MD;  Location: [facility];  Service: General;  Laterality: Left;  left breast duct excision  . [place]   times 2; 1980 and 1982  . [place]  . CHOLECYSTECTOMY  1997  . EXCISION/RELEASE BURSA HIP Right 10/21/2014   Procedure: OPEN RIGHT HIP BURSECTOMY, EXOSECTOMY;  Surgeon: Paralee Cancel, MD;  [facility];  Service: Orthopedics;  Laterality: Right;  . foot surgery  2004, 2005, 2008   bilat secondary to neuropathy   . JOINT REPLACEMENT  10,12   lt total knee X2  . KNEE ARTHROSCOPY  2007, 2008, 2009  . LAPAROSCOPIC GASTRIC SLEEVE RESECTION N/A 10/30/2013   Procedure: LAPAROSCOPIC GASTRIC SLEEVE RESECTION WITH HIATAL HERNIA REPAIR AND UPPER ENDOSCOPY;  Surgeon: Greer Pickerel, MD;  Location: [facility];  Service: General;  Laterality: N/A;  . NASAL SINUS SURGERY  2013  . OPEN SURGICAL REPAIR OF GLUTEAL TENDON  01/17/2012   Procedure: OPEN SURGICAL REPAIR OF GLUTEAL TENDON;  Surgeon: Mauri Pole, MD;  Location: [facility];  Service: Orthopedics;  Laterality: Right;    There were no vitals filed for this visit.   Subjective Assessment - 10/11/19 1118    Subjective COVID-19 screen performed prior to patient entering clinic.  Patient arrived with little discomfort and doing well    Pertinent History right SAD, DCR, RTC repair 07/13/2019, HTN, RA, Osteoporosis    Limitations Lifting;House hold activities    Patient Stated Goals improve movement, use arm normally    Currently in Pain? Yes    Pain Score 2     Pain Location Shoulder    Pain Orientation Right    Pain Descriptors / Indicators Discomfort    Pain Type Surgical pain    Pain Onset More than a month ago    Pain Frequency Intermittent    Aggravating Factors  increased shoulder movement    Pain Relieving Factors at rest                [facility] PT Assessment - 10/11/19 0001      AROM   AROM Assessment Site Shoulder    Right/Left Shoulder Right    Right Shoulder Flexion 135 Degrees    Right Shoulder External Rotation 60 Degrees      PROM   PROM Assessment Site Shoulder    Right/Left Shoulder Right    Right Shoulder Flexion 150 Degrees    Right Shoulder External Rotation 65 Degrees                         [facility] Adult PT Treatment/Exercise - 10/11/19 0001      Shoulder Exercises: Supine   Protraction Strengthening;Right;20 reps    Flexion AROM;Right;20 reps      Shoulder Exercises: Sidelying   External Rotation Strengthening;Right;20 reps    Flexion AROM;20 reps;Right      Shoulder Exercises: Standing   Protraction Strengthening;Right;Theraband;20 reps    Theraband Level (Shoulder Protraction) Level 1 (Yellow)    External Rotation Strengthening;Right;Theraband;20 reps    Theraband Level (Shoulder External Rotation) Level 1 (Yellow)    Internal Rotation Strengthening;Right;Theraband;20 reps    Theraband Level (Shoulder Internal Rotation) Level 1 (Yellow)    Internal Rotation Limitations some discomfort, half range   slow pace required   Retraction Strengthening;Right;Theraband;20 reps    Theraband Level (Shoulder Retraction) Level 1 (Yellow)      Shoulder Exercises: Pulleys   Flexion 5 minutes    Other Pulley Exercises wall ladder x41mn      Shoulder Exercises: Isometric Strengthening   Flexion 5X5"    Extension 5X5"    External Rotation 5X5"    Internal Rotation 5X5"      Vasopneumatic   Number Minutes Vasopneumatic  10 minutes    Vasopnuematic Location  Shoulder    Vasopneumatic Pressure Low    Vasopneumatic Temperature  34                  PT Education - 10/11/19 1139    Education Details HEP progresion    Person(s) Educated Patient    Methods Explanation;Demonstration;Handout    Comprehension Verbalized understanding;Returned demonstration                PT Long Term Goals - 10/08/19 1128      PT LONG TERM GOAL #1   Title Patient will be independent with HEP    Time 4    Period Weeks    Status On-going      PT LONG TERM GOAL #2  Title Patient will demonstate 140+ degrees of right shoulder flexion AROM to improve ability to perform overhead tasks    Baseline AROM 135 degrees 10/08/19    Time 4    Period Weeks    Status On-going      PT LONG TERM GOAL #3   Title Patient will demonstrate 60+ degrees of right shoulder ER AROM to improve donning and doffing apparel.    Baseline Met AROM 60 degrees 10/08/19    Time 4    Period Weeks    Status Achieved      PT LONG TERM GOAL #4   Title Patient will demonstrate 4/5 or greater right shoulder MMT in all planes to improve stability during functional tasks.    Time 4    Period Weeks    Status On-going      PT LONG TERM GOAL #5   Title Patient will report ability to perform ADLs and gardening activities with right shoulder pain less than or equal to 3/10.    Baseline up to 5/10 with ADL's and limited with overhead movements 10/08/19    Time 4    Period Weeks    Status On-going                 Plan - 10/11/19 1144    Clinical Impression Statement Patient tolerated treatment well today. Patient progressing with right shoulder strengthening activities. Patient issued HEP for home progression. Patient ROM is WNL passively and limited active due to weakness. patient current goals progressing.    Personal Factors and Comorbidities Comorbidity 3+    Comorbidities right SAD, DCR, RTC repair 07/13/2019, HTN, RA, Osteoporosis    Examination-Activity Limitations Bathing;Sleep;Hygiene/Grooming;Dressing;Lift;Carry;Caring for Others;Toileting    Stability/Clinical Decision Making Stable/Uncomplicated    Rehab Potential Excellent    PT Frequency 2x / week    PT Duration 3 weeks    PT Treatment/Interventions Cryotherapy;Electrical Stimulation;ADLs/Self Care Home  Management;Iontophoresis 81m/ml Dexamethasone;Moist Heat;Ultrasound;Therapeutic activities;Therapeutic exercise;Neuromuscular re-education;Manual techniques;Passive range of motion;Patient/family education;Vasopneumatic Device;Taping    PT Next Visit Plan continue per protocol 13 weeks 10/08/2019; MD F/U monday note sent today    Consulted and Agree with Plan of Care Patient           Patient will benefit from skilled therapeutic intervention in order to improve the following deficits and impairments:  Decreased activity tolerance, Decreased strength, Decreased range of motion, Impaired UE functional use, Pain  Visit Diagnosis: Stiffness of right shoulder, not elsewhere classified  Muscle weakness (generalized)  Acute pain of right shoulder  Localized edema     Problem List Patient Active Problem List   Diagnosis Date Noted  . Bilateral occipital neuralgia 09/26/2017  . Status post left foot surgery 07/07/2015  . Neuroma 05/20/2015  . HAV (hallux abducto valgus) 05/20/2015  . Hammertoe 05/20/2015  . Prominent metatarsal head 05/20/2015  . Chronic foot pain 05/20/2015  . Anxiety state 03/03/2015  . Hip bursitis 10/22/2014  . Osteophyte of hip 10/21/2014  . Epigastric cramping 11/09/2013  . Nausea with vomiting 11/06/2013  . Rheumatoid arthritis ([place] 11/01/2013  . S/P laparoscopic sleeve gastrectomy 10/30/2013  . Anemia 06/04/2013  . CAP (community acquired pneumonia) 02/17/2013  . Restless leg syndrome 09/18/2012  . Morbid obesity with BMI of 45.0-49.9, adult ([facility] 01/18/2012  . Right gluteus tear 01/17/2012  . Nipple discharge in female 07/08/2011  . Chronic insomnia 06/18/2011  . Discharge from nipple 01/06/2011  . TMJ PAIN 01/12/2010  . DEPRESSION 12/10/2009  . SINUSITIS, ACUTE 12/30/2008  .  Lumbago 12/30/2008  . EDEMA 10/03/2008  . Hyperlipidemia 08/27/2008  . Essential hypertension 08/27/2008  . GERD 08/27/2008    Ladean Raya, PTA 10/11/19 11:53 AM      [facility] [place], [place], [address] Phone: [phone]   Fax:  [phone]  Name: [name] MRN: [id number] Date of Birth: [birth year]

## 2019-10-11 NOTE — Patient Instructions (Signed)
Strengthening: Isometric Flexion   Using wall for resistance, press right fist into ball using light pressure. Hold __5__ seconds. Repeat __10__ times per set. Do __2__ sets per session. Do _2___ sessions per day.   Strengthening: Isometric Extension   Using wall for resistance, press back of right arm into ball using light pressure. Hold _5___ seconds. Repeat __10__ times per set. Do __2__ sets per session. Do __2__ sessions per day.   Strengthening: Isometric External Rotation   Using wall to provide resistance, and keeping right arm at side, press back of hand into ball using light pressure. Hold __5__ seconds. Repeat __10__ times per set. Do _2___ sets per session. Do __2__ sessions per day.   Strengthening: Isometric Internal Rotation   Using door frame for resistance, press palm of right hand into ball using light pressure. Keep elbow in at side. Hold _5___ seconds. Repeat __10__ times per set. Do _2___ sets per session. Do __2__ sessions per day.    

## 2019-10-12 ENCOUNTER — Ambulatory Visit
Admission: RE | Admit: 2019-10-12 | Discharge: 2019-10-12 | Disposition: A | Discharge status: Home / Self Care | Payer: Medicare Other | Source: Ambulatory Visit | Attending: Family Medicine | Admitting: Family Medicine

## 2019-10-12 DIAGNOSIS — Z1231 Encounter for screening mammogram for malignant neoplasm of breast: Secondary | ICD-10-CM | POA: Diagnosis not present

## 2019-10-15 DIAGNOSIS — M7542 Impingement syndrome of left shoulder: Secondary | ICD-10-CM | POA: Diagnosis not present

## 2019-10-15 DIAGNOSIS — Z4789 Encounter for other orthopedic aftercare: Secondary | ICD-10-CM | POA: Diagnosis not present

## 2019-10-15 DIAGNOSIS — M65322 Trigger finger, left index finger: Secondary | ICD-10-CM | POA: Diagnosis not present

## 2019-10-15 DIAGNOSIS — M65332 Trigger finger, left middle finger: Secondary | ICD-10-CM | POA: Diagnosis not present

## 2019-10-16 DIAGNOSIS — Z87891 Personal history of nicotine dependence: Secondary | ICD-10-CM | POA: Diagnosis not present

## 2019-10-16 DIAGNOSIS — J3801 Paralysis of vocal cords and larynx, unilateral: Secondary | ICD-10-CM | POA: Diagnosis not present

## 2019-10-16 DIAGNOSIS — J383 Other diseases of vocal cords: Secondary | ICD-10-CM | POA: Diagnosis not present

## 2019-10-18 DIAGNOSIS — R918 Other nonspecific abnormal finding of lung field: Secondary | ICD-10-CM | POA: Diagnosis not present

## 2019-10-27 ENCOUNTER — Other Ambulatory Visit: Payer: Self-pay | Admitting: Family Medicine

## 2019-10-31 DIAGNOSIS — I890 Lymphedema, not elsewhere classified: Secondary | ICD-10-CM | POA: Diagnosis not present

## 2019-10-31 DIAGNOSIS — Z4889 Encounter for other specified surgical aftercare: Secondary | ICD-10-CM | POA: Diagnosis not present

## 2019-10-31 DIAGNOSIS — M7542 Impingement syndrome of left shoulder: Secondary | ICD-10-CM | POA: Diagnosis not present

## 2019-11-02 DIAGNOSIS — M659 Synovitis and tenosynovitis, unspecified: Secondary | ICD-10-CM | POA: Diagnosis not present

## 2019-11-02 DIAGNOSIS — M19012 Primary osteoarthritis, left shoulder: Secondary | ICD-10-CM | POA: Diagnosis not present

## 2019-11-02 DIAGNOSIS — M65812 Other synovitis and tenosynovitis, left shoulder: Secondary | ICD-10-CM | POA: Diagnosis not present

## 2019-11-02 DIAGNOSIS — S43432A Superior glenoid labrum lesion of left shoulder, initial encounter: Secondary | ICD-10-CM | POA: Diagnosis not present

## 2019-11-02 DIAGNOSIS — M75112 Incomplete rotator cuff tear or rupture of left shoulder, not specified as traumatic: Secondary | ICD-10-CM | POA: Diagnosis not present

## 2019-11-02 DIAGNOSIS — G8918 Other acute postprocedural pain: Secondary | ICD-10-CM | POA: Diagnosis not present

## 2019-11-02 DIAGNOSIS — M7542 Impingement syndrome of left shoulder: Secondary | ICD-10-CM | POA: Diagnosis not present

## 2019-11-03 DIAGNOSIS — Z4889 Encounter for other specified surgical aftercare: Secondary | ICD-10-CM | POA: Diagnosis not present

## 2019-11-03 DIAGNOSIS — I890 Lymphedema, not elsewhere classified: Secondary | ICD-10-CM | POA: Diagnosis not present

## 2019-11-03 DIAGNOSIS — M25512 Pain in left shoulder: Secondary | ICD-10-CM | POA: Diagnosis not present

## 2019-11-03 DIAGNOSIS — M7542 Impingement syndrome of left shoulder: Secondary | ICD-10-CM | POA: Diagnosis not present

## 2019-11-12 ENCOUNTER — Encounter: Payer: Self-pay | Admitting: Physical Therapy

## 2019-11-12 ENCOUNTER — Ambulatory Visit: Payer: Medicare Other | Attending: Orthopedic Surgery | Admitting: Physical Therapy

## 2019-11-12 ENCOUNTER — Other Ambulatory Visit: Payer: Self-pay

## 2019-11-12 DIAGNOSIS — M25512 Pain in left shoulder: Secondary | ICD-10-CM | POA: Diagnosis not present

## 2019-11-12 DIAGNOSIS — R600 Localized edema: Secondary | ICD-10-CM

## 2019-11-12 DIAGNOSIS — M6281 Muscle weakness (generalized): Secondary | ICD-10-CM | POA: Insufficient documentation

## 2019-11-12 DIAGNOSIS — M25612 Stiffness of left shoulder, not elsewhere classified: Secondary | ICD-10-CM | POA: Diagnosis not present

## 2019-11-12 NOTE — Therapy (Addendum)
[facility] [place], [place], [address] Phone: [phone]   Fax:  [phone]  Physical Therapy Evaluation  Patient Details  Name: [name] MRN: [id number] Date of Birth: 05/20/1955 Referring Provider (PT): Justice Britain, MD   Encounter Date: 11/12/2019   PT End of Session - 11/12/19 1228    Visit Number 1    Number of Visits 4    Date for PT Re-Evaluation 12/17/19    Authorization Type FOTO (1st visit: 78% limitation); Progress note every 10th visit; KX modifier after 15t visit, (13 visits used this year)    PT Start Time 1030    PT Stop Time 1111    PT Time Calculation (min) 41 min    Equipment Utilized During Treatment Other (comment)   Left sling with abduction pillow   Activity Tolerance Patient tolerated treatment well    Behavior During Therapy WFL for tasks assessed/performed           Past Medical History:  Diagnosis Date  . ANGIOMA 10/03/2008   REMOVED FROM RIGHT LOWER LEG-BENIGN  . Anxiety   . Arthritis    RA  . Bronchitis    hx of   . Cancer (HCC)    BASAL CELL SKIN CANCER  . Complication of anesthesia   . Fibromyalgia   . GERD 08/27/2008  . History of blood transfusion    1995  . History of hiatal hernia    hx of has had surgically repaired  . History of measles   . History of mumps   . History of nonmelanoma skin cancer   . HYPERLIPIDEMIA 08/27/2008  . HYPERTENSION 08/27/2008   pt is currently on no meds   . Pneumonia    hx of   . PONV (postoperative nausea and vomiting)   . Rheumatoid arthritis ([place])   . SEBORRHEIC KERATOSIS 08/27/2008    Past Surgical History:  Procedure Laterality Date  . ABDOMINAL HYSTERECTOMY  1995  . APPENDECTOMY    . BREAST BIOPSY Left   . BREAST DUCTAL SYSTEM EXCISION  08/16/2011   Procedure: EXCISION DUCTAL SYSTEM BREAST;  Surgeon: Joyice Faster. Cornett, MD;  Location: [facility];  Service: General;  Laterality: Left;  left breast duct  excision  . [place]   times 2; 1980 and 1982  . [place]  . CHOLECYSTECTOMY  1997  . EXCISION/RELEASE BURSA HIP Right 10/21/2014   Procedure: OPEN RIGHT HIP BURSECTOMY, EXOSECTOMY;  Surgeon: Paralee Cancel, MD;  [facility];  Service: Orthopedics;  Laterality: Right;  . foot surgery  2004, 2005, 2008   bilat secondary to neuropathy   . JOINT REPLACEMENT  10,12   lt total knee X2  . KNEE ARTHROSCOPY  2007, 2008, 2009  . LAPAROSCOPIC GASTRIC SLEEVE RESECTION N/A 10/30/2013   Procedure: LAPAROSCOPIC GASTRIC SLEEVE RESECTION WITH HIATAL HERNIA REPAIR AND UPPER ENDOSCOPY;  Surgeon: Greer Pickerel, MD;  [facility];  Service: General;  Laterality: N/A;  . NASAL SINUS SURGERY  2013  . OPEN SURGICAL REPAIR OF GLUTEAL TENDON  01/17/2012   Procedure: OPEN SURGICAL REPAIR OF GLUTEAL TENDON;  Surgeon: Mauri Pole, MD;  [facility];  Service: Orthopedics;  Laterality: Right;    There were no vitals filed for this visit.    Subjective Assessment - 11/12/19 1224    Subjective COVID-19 screening performed upon arrival. Patient arrives to physical therapy with reports of left shoulder pain secondary to a left RTC  repair, SAD and DCR on 11/02/2019. Patient reports gaining assistance from husband for ADLs and showering activities. Patient able to remove sling independently but requires assistance for donning. Patient reports pain at worst as 8/10 and pain at best as 2/10. Patient's goals are to decrease pain, improve movement, improve strength, and return to using arm normally.    Pertinent History Left shoulder RTC repair with SAD and DCR 11/02/2019; R shoulder RTC repair 07/09/2019; HTN, RA, Osteoporosis    Limitations Lifting;House hold activities    Patient Stated Goals decrease pain, improve movement, use arm normally    Currently in Pain? Yes    Pain Score 5     Pain Location Shoulder    Pain Orientation Left    Pain Descriptors / Indicators Aching;Dull    Pain  Type Surgical pain    Pain Onset 1 to 4 weeks ago    Pain Frequency Constant    Aggravating Factors  increased shoulder movement    Pain Relieving Factors rest, muscle relaxer, tylenol, ice machine    Effect of Pain on Daily Activities needs assistance from husband for ADLs              [facility] PT Assessment - 11/12/19 0001      Assessment   Medical Diagnosis Impingement syndrome of left shoulder region    Referring Provider (PT) Justice Britain, MD    Onset Date/Surgical Date 11/02/19    Hand Dominance Right    Next MD Visit 12/10/2019    Prior Therapy for right shoulder      Precautions   Precautions Shoulder    Type of Shoulder Precautions PROM only for 4 weeks; See Dr. Susie Cassette Protocol      Restrictions   Weight Bearing Restrictions Yes    LUE Weight Bearing Non weight bearing      Home Environment   Living Environment Private residence      Prior Function   Level of Independence Needs assistance with ADLs;Needs assistance with homemaking      Observation/Other Assessments   Focus on Therapeutic Outcomes (FOTO)  78% limitation      Observation/Other Assessments-Edema    Edema --   moderate edema upon observation     Posture/Postural Control   Posture/Postural Control Postural limitations    Postural Limitations Rounded Shoulders;Forward head      ROM / Strength   AROM / PROM / Strength PROM      AROM   AROM Assessment Site --    Right/Left Shoulder --      PROM   PROM Assessment Site Shoulder    Right/Left Shoulder Left    Left Shoulder Flexion 45 Degrees    Left Shoulder ABduction 50 Degrees    Left Shoulder External Rotation 6 Degrees      Palpation   Palpation comment tenderness to left shoulder, increased tone to L UT                      Objective measurements completed on examination: See above findings.       [facility] Adult PT Treatment/Exercise - 11/12/19 0001      Electrical Stimulation   Electrical Stimulation Location Left  shoulder    Electrical Stimulation Action IFC    Electrical Stimulation Parameters 80-150 hz x10 mins    Electrical Stimulation Goals Pain;Edema      Vasopneumatic   Number Minutes Vasopneumatic  10 minutes    Vasopnuematic Location  Shoulder  Vasopneumatic Pressure Low    Vasopneumatic Temperature  34                  PT Education - 11/12/19 1227    Education Details Continue HEP of pendululms provided by MD    Person(s) Educated Patient    Methods Explanation    Comprehension Verbalized understanding            PT Short Term Goals - 11/12/19 1232      PT SHORT TERM GOAL #1   Title Patient will demonstrate 90+ degrees of left shoulder flexion PROM to improve ROM    Time 2    Period Weeks    Status New      PT SHORT TERM GOAL #2   Title Patient will demonstrate 30+ degrees of left shoulder ER to improve ROM    Time 2    Period Weeks    Status New             PT Long Term Goals - 11/12/19 1230      PT LONG TERM GOAL #1   Title Patient will be independent with HEP    Time 4    Period Weeks    Status New      PT LONG TERM GOAL #2   Title Patient will demonstate 140+ degrees of left shoulder flexion AROM to improve ability to perform overhead tasks    Baseline --    Time 4    Period Weeks    Status New      PT LONG TERM GOAL #3   Title Patient will demonstrate 60+ degrees of left shoulder ER AROM to improve donning and doffing apparel.    Baseline --    Time 4    Period Weeks    Status New      PT LONG TERM GOAL #4   Title Patient will demonstrate 4/5 or greater left shoulder MMT in all planes to improve stability during functional tasks.    Time 4    Period Weeks    Status New      PT LONG TERM GOAL #5   Title Patient will report ability to perform ADLs and gardening activities with left shoulder pain less than or equal to 3/10.    Baseline --    Time 4    Period Weeks    Status New                  Plan - 11/12/19 1258     Clinical Impression Statement Patient is a 64 year old right handed female who presents to physical therapy with left shoulder pain, decreased left shoulder ROM, and localized edema secondary to a L RTC repair with SAD and DCR on 11/02/2019. Patient's port incisions are healing well but still with scabbing. Patient and PT discussed plan of care and instructed to continue HEP provided by surgeon. Patient reported understanding. Patient would benefit from skilled physical therapy to address deficits and address patient's goals.    Personal Factors and Comorbidities Comorbidity 3+    Comorbidities Left shoulder RTC repair with SAD and DCR 11/02/2019; R shoulder RTC repair 07/09/2019; HTN, RA, Osteoporosis    Examination-Activity Limitations Bathing;Sleep;Hygiene/Grooming;Dressing;Lift;Carry;Caring for Others;Toileting    Stability/Clinical Decision Making Stable/Uncomplicated    Clinical Decision Making Low    Rehab Potential Excellent    PT Frequency 2x / week    PT Duration 3 weeks    PT Treatment/Interventions Cryotherapy;Electrical Stimulation;ADLs/Self Care Home  Management;Iontophoresis 4mg /ml Dexamethasone;Moist Heat;Ultrasound;Therapeutic activities;Therapeutic exercise;Neuromuscular re-education;Manual techniques;Passive range of motion;Patient/family education;Vasopneumatic Device;Taping    PT Next Visit Plan Left shoulder PROM only for 4 weeks per MD referral. Modalities PRN for pain relief.    PT Home Exercise Plan see patient education section    Consulted and Agree with Plan of Care Patient           Patient will benefit from skilled therapeutic intervention in order to improve the following deficits and impairments:  Decreased activity tolerance, Decreased strength, Decreased range of motion, Impaired UE functional use, Pain  Visit Diagnosis: Acute pain of left shoulder - Plan: PT plan of care cert/re-cert  Stiffness of left shoulder, not elsewhere classified - Plan: PT plan of care  cert/re-cert  Muscle weakness (generalized) - Plan: PT plan of care cert/re-cert  Localized edema - Plan: PT plan of care cert/re-cert     Problem List Patient Active Problem List   Diagnosis Date Noted  . Bilateral occipital neuralgia 09/26/2017  . Status post left foot surgery 07/07/2015  . Neuroma 05/20/2015  . HAV (hallux abducto valgus) 05/20/2015  . Hammertoe 05/20/2015  . Prominent metatarsal head 05/20/2015  . Chronic foot pain 05/20/2015  . Anxiety state 03/03/2015  . Hip bursitis 10/22/2014  . Osteophyte of hip 10/21/2014  . Epigastric cramping 11/09/2013  . Nausea with vomiting 11/06/2013  . Rheumatoid arthritis ([place]) 11/01/2013  . S/P laparoscopic sleeve gastrectomy 10/30/2013  . Anemia 06/04/2013  . CAP (community acquired pneumonia) 02/17/2013  . Restless leg syndrome 09/18/2012  . Morbid obesity with BMI of 45.0-49.9, adult ([employer]) 01/18/2012  . Right gluteus tear 01/17/2012  . Nipple discharge in female 07/08/2011  . Chronic insomnia 06/18/2011  . Discharge from nipple 01/06/2011  . TMJ PAIN 01/12/2010  . DEPRESSION 12/10/2009  . SINUSITIS, ACUTE 12/30/2008  . Lumbago 12/30/2008  . EDEMA 10/03/2008  . Hyperlipidemia 08/27/2008  . Essential hypertension 08/27/2008  . GERD 08/27/2008    Gabriela Eves, PT, DPT 11/12/2019, 1:30 PM  [facility] [address] [place], [place], [address] Phone: [phone]   Fax:  [phone]  Name: [name] MRN: [id number] Date of Birth: [birth year]

## 2019-11-15 ENCOUNTER — Encounter: Payer: Self-pay | Admitting: Family Medicine

## 2019-11-18 DIAGNOSIS — H5213 Myopia, bilateral: Secondary | ICD-10-CM | POA: Diagnosis not present

## 2019-11-20 DIAGNOSIS — Z885 Allergy status to narcotic agent status: Secondary | ICD-10-CM | POA: Diagnosis not present

## 2019-11-20 DIAGNOSIS — Z881 Allergy status to other antibiotic agents status: Secondary | ICD-10-CM | POA: Diagnosis not present

## 2019-11-20 DIAGNOSIS — Z888 Allergy status to other drugs, medicaments and biological substances status: Secondary | ICD-10-CM | POA: Diagnosis not present

## 2019-11-20 DIAGNOSIS — Z880 Allergy status to penicillin: Secondary | ICD-10-CM | POA: Diagnosis not present

## 2019-11-20 DIAGNOSIS — F17210 Nicotine dependence, cigarettes, uncomplicated: Secondary | ICD-10-CM | POA: Diagnosis not present

## 2019-11-20 DIAGNOSIS — R490 Dysphonia: Secondary | ICD-10-CM | POA: Diagnosis not present

## 2019-11-20 DIAGNOSIS — J385 Laryngeal spasm: Secondary | ICD-10-CM | POA: Diagnosis not present

## 2019-11-20 DIAGNOSIS — J3801 Paralysis of vocal cords and larynx, unilateral: Secondary | ICD-10-CM | POA: Diagnosis not present

## 2019-11-20 DIAGNOSIS — J383 Other diseases of vocal cords: Secondary | ICD-10-CM | POA: Diagnosis not present

## 2019-11-21 ENCOUNTER — Ambulatory Visit: Payer: Medicare Other | Admitting: Physical Therapy

## 2019-11-21 ENCOUNTER — Encounter: Payer: Self-pay | Admitting: Physical Therapy

## 2019-11-21 ENCOUNTER — Other Ambulatory Visit: Payer: Self-pay | Admitting: Family Medicine

## 2019-11-21 ENCOUNTER — Other Ambulatory Visit: Payer: Self-pay

## 2019-11-21 DIAGNOSIS — M25612 Stiffness of left shoulder, not elsewhere classified: Secondary | ICD-10-CM | POA: Diagnosis not present

## 2019-11-21 DIAGNOSIS — M25512 Pain in left shoulder: Secondary | ICD-10-CM | POA: Diagnosis not present

## 2019-11-21 DIAGNOSIS — R600 Localized edema: Secondary | ICD-10-CM

## 2019-11-21 DIAGNOSIS — M6281 Muscle weakness (generalized): Secondary | ICD-10-CM

## 2019-11-21 NOTE — Therapy (Signed)
[facility] [place], [place], [address] Phone: [phone]   Fax:  [phone]  Physical Therapy Treatment  Patient Details  Name: [name] MRN: [id number] Date of Birth: [birth year] Referring Provider (PT): Justice Britain, MD   Encounter Date: 11/21/2019   PT End of Session - 11/21/19 1239    Visit Number 2    Number of Visits 4    Date for PT Re-Evaluation 12/17/19    Authorization Type FOTO (1st visit: 78% limitation); Progress note every 10th visit; KX modifier after 15t visit, (13 visits used this year)    PT Start Time 1115    PT Stop Time 1202    PT Time Calculation (min) 47 min    Equipment Utilized During Treatment Other (comment)   L sling with ABD pillow   Activity Tolerance Patient tolerated treatment well    Behavior During Therapy WFL for tasks assessed/performed           Past Medical History:  Diagnosis Date  . ANGIOMA 10/03/2008   REMOVED FROM RIGHT LOWER LEG-BENIGN  . Anxiety   . Arthritis    RA  . Bronchitis    hx of   . Cancer (HCC)    BASAL CELL SKIN CANCER  . Complication of anesthesia   . Fibromyalgia   . GERD 08/27/2008  . History of blood transfusion    1995  . History of hiatal hernia    hx of has had surgically repaired  . History of measles   . History of mumps   . History of nonmelanoma skin cancer   . HYPERLIPIDEMIA 08/27/2008  . HYPERTENSION 08/27/2008   pt is currently on no meds   . Pneumonia    hx of   . PONV (postoperative nausea and vomiting)   . Rheumatoid arthritis ([place])   . SEBORRHEIC KERATOSIS 08/27/2008    Past Surgical History:  Procedure Laterality Date  . ABDOMINAL HYSTERECTOMY  1995  . APPENDECTOMY    . BREAST BIOPSY Left   . BREAST DUCTAL SYSTEM EXCISION  08/16/2011   Procedure: EXCISION DUCTAL SYSTEM BREAST;  Surgeon: Joyice Faster. Cornett, MD;  Location: [place];  Service: General;  Laterality: Left;  left breast duct excision  .  [place]   times 2; 1980 and 1982  . [place]  . CHOLECYSTECTOMY  1997  . EXCISION/RELEASE BURSA HIP Right 10/21/2014   Procedure: OPEN RIGHT HIP BURSECTOMY, EXOSECTOMY;  Surgeon: Paralee Cancel, MD;  Location: [facility];  Service: Orthopedics;  Laterality: Right;  . foot surgery  2004, 2005, 2008   bilat secondary to neuropathy   . JOINT REPLACEMENT  10,12   lt total knee X2  . KNEE ARTHROSCOPY  2007, 2008, 2009  . LAPAROSCOPIC GASTRIC SLEEVE RESECTION N/A 10/30/2013   Procedure: LAPAROSCOPIC GASTRIC SLEEVE RESECTION WITH HIATAL HERNIA REPAIR AND UPPER ENDOSCOPY;  Surgeon: Greer Pickerel, MD;  Location: [facility];  Service: General;  Laterality: N/A;  . NASAL SINUS SURGERY  2013  . OPEN SURGICAL REPAIR OF GLUTEAL TENDON  01/17/2012   Procedure: OPEN SURGICAL REPAIR OF GLUTEAL TENDON;  Surgeon: Mauri Pole, MD;  Location: [facility];  Service: Orthopedics;  Laterality: Right;    There were no vitals filed for this visit.   Subjective Assessment - 11/21/19 1119    Subjective COVID-19 screening performed upon arrival. Reports RA is acting up but shoulder feels about 1/10.    Pertinent History Left shoulder  RTC repair with SAD and DCR 11/02/2019; R shoulder RTC repair 07/09/2019; HTN, RA, Osteoporosis    Limitations Lifting;House hold activities    Currently in Pain? Yes    Pain Score 1     Pain Orientation Left    Pain Descriptors / Indicators Aching;Dull    Pain Type Surgical pain    Pain Onset 1 to 4 weeks ago    Pain Frequency Constant              [facility] PT Assessment - 11/21/19 0001      Assessment   Medical Diagnosis Impingement syndrome of left shoulder region    Referring Provider (PT) Justice Britain, MD    Onset Date/Surgical Date 11/02/19    Hand Dominance Right    Next MD Visit 12/10/2019      Precautions   Precautions Shoulder    Type of Shoulder Precautions PROM only for 4 weeks; See Dr. Susie Cassette Protocol      PROM   PROM Assessment Site Shoulder     Right/Left Shoulder Left    Left Shoulder Flexion 120 Degrees    Left Shoulder ABduction 93 Degrees    Left Shoulder External Rotation 48 Degrees                         [facility] Adult PT Treatment/Exercise - 11/21/19 0001      Vasopneumatic   Number Minutes Vasopneumatic  10 minutes    Vasopnuematic Location  Shoulder    Vasopneumatic Pressure Low    Vasopneumatic Temperature  34      Manual Therapy   Manual Therapy Passive ROM    Manual therapy comments gentle STW/M thoughout PROM to L shoulder duing PROM    Passive ROM Left shoulder PROM into flexion, ER and abd with intermittent oscillations to decrease pain and guarding                    PT Short Term Goals - 11/21/19 1241      PT SHORT TERM GOAL #1   Title Patient will demonstrate 90+ degrees of left shoulder flexion PROM to improve ROM    Time 2    Period Weeks    Status Achieved      PT SHORT TERM GOAL #2   Title Patient will demonstrate 30+ degrees of left shoulder ER to improve ROM    Time 2    Period Weeks    Status Achieved             PT Long Term Goals - 11/12/19 1230      PT LONG TERM GOAL #1   Title Patient will be independent with HEP    Time 4    Period Weeks    Status New      PT LONG TERM GOAL #2   Title Patient will demonstate 140+ degrees of left shoulder flexion AROM to improve ability to perform overhead tasks    Baseline --    Time 4    Period Weeks    Status New      PT LONG TERM GOAL #3   Title Patient will demonstrate 60+ degrees of left shoulder ER AROM to improve donning and doffing apparel.    Baseline --    Time 4    Period Weeks    Status New      PT LONG TERM GOAL #4   Title Patient will demonstrate 4/5 or greater left  shoulder MMT in all planes to improve stability during functional tasks.    Time 4    Period Weeks    Status New      PT LONG TERM GOAL #5   Title Patient will report ability to perform ADLs and gardening activities with  left shoulder pain less than or equal to 3/10.    Baseline --    Time 4    Period Weeks    Status New                 Plan - 11/21/19 1239    Clinical Impression Statement Patient responded well to therapy session. L shoulder PROM performed with smooth arcs of motion. Intermittent oscillations performed to decrease pain with good response. Patient reports being consistent with pendulum exercises at home Notable improvements in PROM, see measurements. No adverse effects upon removal of modalities.    Personal Factors and Comorbidities Comorbidity 3+    Comorbidities Left shoulder RTC repair with SAD and DCR 11/02/2019; R shoulder RTC repair 07/09/2019; HTN, RA, Osteoporosis    Examination-Activity Limitations Bed Mobility    Stability/Clinical Decision Making Stable/Uncomplicated    Clinical Decision Making Low    Rehab Potential Excellent    PT Frequency 1x / week    PT Duration 4 weeks    PT Treatment/Interventions Cryotherapy;Electrical Stimulation;ADLs/Self Care Home Management;Iontophoresis 4mg /ml Dexamethasone;Moist Heat;Ultrasound;Therapeutic activities;Therapeutic exercise;Neuromuscular re-education;Manual techniques;Passive range of motion;Patient/family education;Vasopneumatic Device;Taping    PT Next Visit Plan Left shoulder PROM only for 4 weeks per MD referral. Modalities PRN for pain relief.    PT Home Exercise Plan see patient education section    Consulted and Agree with Plan of Care Patient           Patient will benefit from skilled therapeutic intervention in order to improve the following deficits and impairments:  Decreased activity tolerance, Decreased strength, Decreased range of motion, Impaired UE functional use, Pain  Visit Diagnosis: Acute pain of left shoulder  Stiffness of left shoulder, not elsewhere classified  Muscle weakness (generalized)  Localized edema     Problem List Patient Active Problem List   Diagnosis Date Noted  . Bilateral  occipital neuralgia 09/26/2017  . Status post left foot surgery 07/07/2015  . Neuroma 05/20/2015  . HAV (hallux abducto valgus) 05/20/2015  . Hammertoe 05/20/2015  . Prominent metatarsal head 05/20/2015  . Chronic foot pain 05/20/2015  . Anxiety state 03/03/2015  . Hip bursitis 10/22/2014  . Osteophyte of hip 10/21/2014  . Epigastric cramping 11/09/2013  . Nausea with vomiting 11/06/2013  . Rheumatoid arthritis ([place]) 11/01/2013  . S/P laparoscopic sleeve gastrectomy 10/30/2013  . Anemia 06/04/2013  . CAP (community acquired pneumonia) 02/17/2013  . Restless leg syndrome 09/18/2012  . Morbid obesity with BMI of 45.0-49.9, adult ([place]) 01/18/2012  . Right gluteus tear 01/17/2012  . Nipple discharge in female 07/08/2011  . Chronic insomnia 06/18/2011  . Discharge from nipple 01/06/2011  . TMJ PAIN 01/12/2010  . DEPRESSION 12/10/2009  . SINUSITIS, ACUTE 12/30/2008  . Lumbago 12/30/2008  . EDEMA 10/03/2008  . Hyperlipidemia 08/27/2008  . Essential hypertension 08/27/2008  . GERD 08/27/2008    Gabriela Eves, PT, DPT 11/21/2019, 12:42 PM  [facility] [address] [place], [place], [address] Phone: [phone]   Fax:  [phone]  Name: [name] MRN: [id number] Date of Birth: 1955/09/10

## 2019-11-28 ENCOUNTER — Ambulatory Visit: Payer: Medicare Other | Attending: Orthopedic Surgery | Admitting: Physical Therapy

## 2019-11-28 ENCOUNTER — Encounter: Payer: Self-pay | Admitting: Physical Therapy

## 2019-11-28 ENCOUNTER — Other Ambulatory Visit: Payer: Self-pay

## 2019-11-28 DIAGNOSIS — M25512 Pain in left shoulder: Secondary | ICD-10-CM

## 2019-11-28 DIAGNOSIS — R600 Localized edema: Secondary | ICD-10-CM | POA: Diagnosis not present

## 2019-11-28 DIAGNOSIS — M6281 Muscle weakness (generalized): Secondary | ICD-10-CM | POA: Diagnosis not present

## 2019-11-28 DIAGNOSIS — M79641 Pain in right hand: Secondary | ICD-10-CM | POA: Diagnosis not present

## 2019-11-28 DIAGNOSIS — M25612 Stiffness of left shoulder, not elsewhere classified: Secondary | ICD-10-CM | POA: Diagnosis not present

## 2019-11-28 NOTE — Therapy (Signed)
[facility] [place], [place], [address] Phone: [phone]   Fax:  [phone]  Physical Therapy Treatment  Patient Details  Name: [name] MRN: [id number] Date of Birth: 01/08/1956 Referring Provider (PT): Justice Britain, MD   Encounter Date: 11/28/2019   PT End of Session - 11/28/19 1209    Visit Number 3    Number of Visits 4    Date for PT Re-Evaluation 12/17/19    Authorization Type FOTO (1st visit: 78% limitation); Progress note every 10th visit; KX modifier after 15t visit, (13 visits used this year)    PT Start Time 1115    PT Stop Time 1204    PT Time Calculation (min) 49 min    Equipment Utilized During Treatment Other (comment)   R sling with abduction pillow   Activity Tolerance Patient tolerated treatment well    Behavior During Therapy WFL for tasks assessed/performed           Past Medical History:  Diagnosis Date  . ANGIOMA 10/03/2008   REMOVED FROM RIGHT LOWER LEG-BENIGN  . Anxiety   . Arthritis    RA  . Bronchitis    hx of   . Cancer (HCC)    BASAL CELL SKIN CANCER  . Complication of anesthesia   . Fibromyalgia   . GERD 08/27/2008  . History of blood transfusion    1995  . History of hiatal hernia    hx of has had surgically repaired  . History of measles   . History of mumps   . History of nonmelanoma skin cancer   . HYPERLIPIDEMIA 08/27/2008  . HYPERTENSION 08/27/2008   pt is currently on no meds   . Pneumonia    hx of   . PONV (postoperative nausea and vomiting)   . Rheumatoid arthritis ([place])   . SEBORRHEIC KERATOSIS 08/27/2008    Past Surgical History:  Procedure Laterality Date  . ABDOMINAL HYSTERECTOMY  1995  . APPENDECTOMY    . BREAST BIOPSY Left   . BREAST DUCTAL SYSTEM EXCISION  08/16/2011   Procedure: EXCISION DUCTAL SYSTEM BREAST;  Surgeon: Joyice Faster. Cornett, MD;  Location: [place];  Service: General;  Laterality: Left;  left breast duct excision   . [place]   times 2; 1980 and 1982  . [place]  . CHOLECYSTECTOMY  1997  . EXCISION/RELEASE BURSA HIP Right 10/21/2014   Procedure: OPEN RIGHT HIP BURSECTOMY, EXOSECTOMY;  Surgeon: Paralee Cancel, MD;  Location[facility];  Service: Orthopedics;  Laterality: Right;  . foot surgery  2004, 2005, 2008   bilat secondary to neuropathy   . JOINT REPLACEMENT  10,12   lt total knee X2  . KNEE ARTHROSCOPY  2007, 2008, 2009  . LAPAROSCOPIC GASTRIC SLEEVE RESECTION N/A 10/30/2013   Procedure: LAPAROSCOPIC GASTRIC SLEEVE RESECTION WITH HIATAL HERNIA REPAIR AND UPPER ENDOSCOPY;  Surgeon: Greer Pickerel, MD;  Location: [facility];  Service: General;  Laterality: N/A;  . NASAL SINUS SURGERY  2013  . OPEN SURGICAL REPAIR OF GLUTEAL TENDON  01/17/2012   Procedure: OPEN SURGICAL REPAIR OF GLUTEAL TENDON;  Surgeon: Mauri Pole, MD;  Location: [facility];  Service: Orthopedics;  Laterality: Right;    There were no vitals filed for this visit.   Subjective Assessment - 11/28/19 1209    Subjective COVID-19 screening performed upon arrival. Reports shoulder is feeling good today with no reports of pain.    Pertinent History Left  shoulder RTC repair with SAD and DCR 11/02/2019; R shoulder RTC repair 07/09/2019; HTN, RA, Osteoporosis    Limitations Lifting;House hold activities    Patient Stated Goals decrease pain, improve movement, use arm normally    Currently in Pain? No/denies              [facility] PT Assessment - 11/28/19 0001      Assessment   Medical Diagnosis Impingement syndrome of left shoulder region    Referring Provider (PT) Justice Britain, MD    Onset Date/Surgical Date 11/02/19    Hand Dominance Right    Next MD Visit 12/10/2019    Prior Therapy for right shoulder      Precautions   Precautions Shoulder    Type of Shoulder Precautions PROM only for 4 weeks; See Dr. Susie Cassette Protocol                         [facility] Adult PT Treatment/Exercise - 11/28/19 0001       Modalities   Modalities Vasopneumatic      Vasopneumatic   Number Minutes Vasopneumatic  10 minutes    Vasopnuematic Location  Shoulder    Vasopneumatic Pressure Low    Vasopneumatic Temperature  34      Manual Therapy   Manual Therapy Passive ROM    Manual therapy comments gentle STW/M thoughout PROM to L shoulder to decrease pain    Passive ROM Left shoulder PROM into flexion, ER, IR, and abd with intermittent oscillations to decrease pain and guarding                    PT Short Term Goals - 11/21/19 1241      PT SHORT TERM GOAL #1   Title Patient will demonstrate 90+ degrees of left shoulder flexion PROM to improve ROM    Time 2    Period Weeks    Status Achieved      PT SHORT TERM GOAL #2   Title Patient will demonstrate 30+ degrees of left shoulder ER to improve ROM    Time 2    Period Weeks    Status Achieved             PT Long Term Goals - 11/12/19 1230      PT LONG TERM GOAL #1   Title Patient will be independent with HEP    Time 4    Period Weeks    Status New      PT LONG TERM GOAL #2   Title Patient will demonstate 140+ degrees of left shoulder flexion AROM to improve ability to perform overhead tasks    Baseline --    Time 4    Period Weeks    Status New      PT LONG TERM GOAL #3   Title Patient will demonstrate 60+ degrees of left shoulder ER AROM to improve donning and doffing apparel.    Baseline --    Time 4    Period Weeks    Status New      PT LONG TERM GOAL #4   Title Patient will demonstrate 4/5 or greater left shoulder MMT in all planes to improve stability during functional tasks.    Time 4    Period Weeks    Status New      PT LONG TERM GOAL #5   Title Patient will report ability to perform ADLs and gardening activities with left shoulder pain  less than or equal to 3/10.    Baseline --    Time 4    Period Weeks    Status New                 Plan - 11/28/19 1210    Clinical Impression Statement  Patient responded well to PROM to left shoulder with minimal reports of pain. Patient noted with smooth arcs of motion; intermittent oscillations provided to decrease muscle guarding. No adverse affects upon removal of modalities.    Personal Factors and Comorbidities Comorbidity 3+    Comorbidities Left shoulder RTC repair with SAD and DCR 11/02/2019; R shoulder RTC repair 07/09/2019; HTN, RA, Osteoporosis    Examination-Activity Limitations Bed Mobility    Stability/Clinical Decision Making Stable/Uncomplicated    Clinical Decision Making Low    Rehab Potential Excellent    PT Frequency 1x / week    PT Duration 4 weeks    PT Treatment/Interventions Cryotherapy;Electrical Stimulation;ADLs/Self Care Home Management;Iontophoresis 4mg /ml Dexamethasone;Moist Heat;Ultrasound;Therapeutic activities;Therapeutic exercise;Neuromuscular re-education;Manual techniques;Passive range of motion;Patient/family education;Vasopneumatic Device;Taping    PT Next Visit Plan MD note; Left shoulder PROM only for 4 weeks per MD referral. Modalities PRN for pain relief.    PT Home Exercise Plan see patient education section    Consulted and Agree with Plan of Care Patient           Patient will benefit from skilled therapeutic intervention in order to improve the following deficits and impairments:  Decreased activity tolerance, Decreased strength, Decreased range of motion, Impaired UE functional use, Pain  Visit Diagnosis: Acute pain of left shoulder  Stiffness of left shoulder, not elsewhere classified  Muscle weakness (generalized)  Localized edema     Problem List Patient Active Problem List   Diagnosis Date Noted  . Bilateral occipital neuralgia 09/26/2017  . Status post left foot surgery 07/07/2015  . Neuroma 05/20/2015  . HAV (hallux abducto valgus) 05/20/2015  . Hammertoe 05/20/2015  . Prominent metatarsal head 05/20/2015  . Chronic foot pain 05/20/2015  . Anxiety state 03/03/2015  . Hip  bursitis 10/22/2014  . Osteophyte of hip 10/21/2014  . Epigastric cramping 11/09/2013  . Nausea with vomiting 11/06/2013  . Rheumatoid arthritis ([place]) 11/01/2013  . S/P laparoscopic sleeve gastrectomy 10/30/2013  . Anemia 06/04/2013  . CAP (community acquired pneumonia) 02/17/2013  . Restless leg syndrome 09/18/2012  . Morbid obesity with BMI of 45.0-49.9, adult ([place]) 01/18/2012  . Right gluteus tear 01/17/2012  . Nipple discharge in female 07/08/2011  . Chronic insomnia 06/18/2011  . Discharge from nipple 01/06/2011  . TMJ PAIN 01/12/2010  . DEPRESSION 12/10/2009  . SINUSITIS, ACUTE 12/30/2008  . Lumbago 12/30/2008  . EDEMA 10/03/2008  . Hyperlipidemia 08/27/2008  . Essential hypertension 08/27/2008  . GERD 08/27/2008    Gabriela Eves, PT, DPT 11/28/2019, 12:15 PM  [facility] [address] [place], [place], [address] Phone: [phone]   Fax:  [phone]  Name: [name] MRN: [id number] Date of Birth: 1955/09/02

## 2019-12-05 ENCOUNTER — Encounter: Payer: Self-pay | Admitting: Family Medicine

## 2019-12-05 ENCOUNTER — Other Ambulatory Visit: Payer: Self-pay

## 2019-12-05 ENCOUNTER — Ambulatory Visit (INDEPENDENT_AMBULATORY_CARE_PROVIDER_SITE_OTHER): Payer: Medicare Other | Admitting: Family Medicine

## 2019-12-05 VITALS — BP 134/72 | HR 85 | Temp 98.2°F | Ht 64.0 in | Wt 214.6 lb

## 2019-12-05 DIAGNOSIS — Z23 Encounter for immunization: Secondary | ICD-10-CM

## 2019-12-05 DIAGNOSIS — Z8639 Personal history of other endocrine, nutritional and metabolic disease: Secondary | ICD-10-CM

## 2019-12-05 DIAGNOSIS — I10 Essential (primary) hypertension: Secondary | ICD-10-CM | POA: Diagnosis not present

## 2019-12-05 DIAGNOSIS — Z0000 Encounter for general adult medical examination without abnormal findings: Secondary | ICD-10-CM

## 2019-12-05 DIAGNOSIS — E785 Hyperlipidemia, unspecified: Secondary | ICD-10-CM

## 2019-12-05 DIAGNOSIS — R918 Other nonspecific abnormal finding of lung field: Secondary | ICD-10-CM

## 2019-12-05 MED ORDER — ESTRADIOL 0.5 MG PO TABS: 0.5000 mg | ORAL_TABLET | Freq: Every day | ORAL | 0 refills | Status: DC

## 2019-12-05 NOTE — Progress Notes (Signed)
Established Patient Office Visit  Subjective:  Patient ID: [name], female    DOB: Jun 11, 1955  Age: 64 y.o. MRN: [id number]  CC:  Chief Complaint  Patient presents with  . Annual Exam    asking for high dose flu due to med cond    HPI [name] presents for physical exam.  She has chronic problems include history of obesity, hypertension, rheumatoid arthritis, chronic insomnia, hyperlipidemia, restless leg symptoms.  She has her left shoulder in a sling from recent rotator cuff surgery.  She had right rotator cuff surgery last May and left rotator cuff surgery in August.  Her surgery went well.  She was seen recently with some increased hoarseness.  She was referred to ENT over at [facility] and had studies done which showed vocal cord paralysis.  There was incidental note and imaging of 8 mm right upper lobe lung nodule with recommendation to consider low-dose CT follow-up in 6 months.  Patient smoked for 4 years back in her early 55s.  No recent cough.  No dyspnea.  She does still take Estrace 1 mg daily.  Health maintenance reviewed  -Needs flu vaccine -Previous hepatitis C screen negative -Covid vaccines given -Colonoscopy due 09/14/1928 -She had mammogram 10/12/2019 -Previous hysterectomy so no indications for Pap smears -Needs tetanus but she would like to get flu vaccine today.  Since her left arm is not option because of recent surgery she would like to defer at this time  He has had previous gastric sleeve surgery.  Past history of B12 deficiency.  Family history reviewed-her mother and 2 sisters had coronary disease in their 33s and 43s.  Social history-she is married.  Smoked for 4 years as above.  Less than 4-pack-year history.  No alcohol use.  Past Medical History:  Diagnosis Date  . ANGIOMA 10/03/2008   REMOVED FROM RIGHT LOWER LEG-BENIGN  . Anxiety   . Arthritis    RA  . Bronchitis    hx of   . Cancer (HCC)    BASAL CELL SKIN CANCER   . Complication of anesthesia   . Fibromyalgia   . GERD 08/27/2008  . History of blood transfusion    1995  . History of hiatal hernia    hx of has had surgically repaired  . History of measles   . History of mumps   . History of nonmelanoma skin cancer   . HYPERLIPIDEMIA 08/27/2008  . HYPERTENSION 08/27/2008   pt is currently on no meds   . Pneumonia    hx of   . PONV (postoperative nausea and vomiting)   . Rheumatoid arthritis ([place])   . SEBORRHEIC KERATOSIS 08/27/2008    Past Surgical History:  Procedure Laterality Date  . ABDOMINAL HYSTERECTOMY  1995  . APPENDECTOMY    . BREAST BIOPSY Left   . BREAST DUCTAL SYSTEM EXCISION  08/16/2011   Procedure: EXCISION DUCTAL SYSTEM BREAST;  Surgeon: Joyice Faster. Cornett, MD;  Location: [facility];  Service: General;  Laterality: Left;  left breast duct excision  . [place]   times 2; 1980 and 1982  . [place]  . CHOLECYSTECTOMY  1997  . EXCISION/RELEASE BURSA HIP Right 10/21/2014   Procedure: OPEN RIGHT HIP BURSECTOMY, EXOSECTOMY;  Surgeon: Paralee Cancel, MD;  [facility];  Service: Orthopedics;  Laterality: Right;  . foot surgery  2004, 2005, 2008   bilat secondary to neuropathy   . JOINT REPLACEMENT  10,12   lt total knee X2  . KNEE ARTHROSCOPY  2007, 2008, 2009  . LAPAROSCOPIC GASTRIC SLEEVE RESECTION N/A 10/30/2013   Procedure: LAPAROSCOPIC GASTRIC SLEEVE RESECTION WITH HIATAL HERNIA REPAIR AND UPPER ENDOSCOPY;  Surgeon: Greer Pickerel, MD;  Location[facility];  Service: General;  Laterality: N/A;  . NASAL SINUS SURGERY  2013  . OPEN SURGICAL REPAIR OF GLUTEAL TENDON  01/17/2012   Procedure: OPEN SURGICAL REPAIR OF GLUTEAL TENDON;  Surgeon: Mauri Pole, MD;  [facility];  Service: Orthopedics;  Laterality: Right;    Family History  Problem Relation Age of Onset  . Cancer Mother        breast  . Breast cancer Mother 54  . Kidney disease Father   . Epilepsy Father   . Heart attack Sister    . Heart attack Sister   . Diabetes Son     Social History   Socioeconomic History  . Marital status: Married    Spouse name: Not on file  . Number of children: 2  . Years of education: Not on file  . Highest education level: High school graduate  Occupational History  . Occupation: retired    Comment: Building services engineer  Tobacco Use  . Smoking status: Former Smoker    Packs/day: 0.25    Years: 4.00    Pack years: 1.00    Types: Cigarettes    Quit date: 02/23/1984    Years since quitting: 35.8  . Smokeless tobacco: Never Used  Vaping Use  . Vaping Use: Never used  Substance and Sexual Activity  . Alcohol use: No  . Drug use: No  . Sexual activity: Not on file  Other Topics Concern  . Not on file  Social History Narrative   Lives at home with husband   Disabled   Right handed   Caffeine: 1 cup of coffee daily   Social Determinants of Health   Financial Resource Strain:   . Difficulty of Paying Living Expenses: Not on file  Food Insecurity:   . Worried About Charity fundraiser in the Last Year: Not on file  . Ran Out of Food in the Last Year: Not on file  Transportation Needs:   . Lack of Transportation (Medical): Not on file  . Lack of Transportation (Non-Medical): Not on file  Physical Activity:   . Days of Exercise per Week: Not on file  . Minutes of Exercise per Session: Not on file  Stress:   . Feeling of Stress : Not on file  Social Connections:   . Frequency of Communication with Friends and Family: Not on file  . Frequency of Social Gatherings with Friends and Family: Not on file  . Attends Religious Services: Not on file  . Active Member of Clubs or Organizations: Not on file  . Attends Archivist Meetings: Not on file  . Marital Status: Not on file  Intimate Partner Violence:   . Fear of Current or Ex-Partner: Not on file  . Emotionally Abused: Not on file  . Physically Abused: Not on file  . Sexually Abused: Not on file     Outpatient Medications Prior to Visit  Medication Sig Dispense Refill  . acetaminophen (TYLENOL) 500 MG tablet Take 1,000 mg by mouth every 6 (six) hours as needed for pain.     . Biotin 1000 MCG tablet Take 3,000 mcg by mouth daily.    Marland Kitchen EPINEPHrine (EPIPEN 2-PAK) 0.3 mg/0.3 mL IJ SOAJ injection Inject  0.3 mLs (0.3 mg total) into the muscle once. 2 each 1  . estradiol (ESTRACE) 1 MG tablet Take 1 tablet by mouth once daily 90 tablet 0  . etanercept (ENBREL) 50 MG/ML injection Inject 0.98 mLs (50 mg total) into the skin once a week. Resume end of next week (Patient taking differently: Inject 50 mg into the skin once a week. Friday) 4.48 mL 0  . folic acid (FOLVITE) 1 MG tablet Take 2 mg by mouth daily.     . hydroxychloroquine (PLAQUENIL) 200 MG tablet TAKE 1 TABLET BY MOUTH ONCE DAILY WITH FOOD OR MILK FOR 90 DAYS    . lisinopril (ZESTRIL) 10 MG tablet Take 1 tablet by mouth once daily 90 tablet 0  . methotrexate (50 MG/ML) 1 gm SOLR May resume previous dose at  end of next week (Patient taking differently: Inject 50 mg into the skin once a week. Friday)    . ondansetron (ZOFRAN ODT) 4 MG disintegrating tablet 4mg  ODT q4 hours prn nausea/vomit 20 tablet 0  . oxyCODONE-acetaminophen (PERCOCET/ROXICET) 5-325 MG tablet TAKE 1 TABLET AS NEEDED EVERY 8 HOURS FOR PAIN  0  . predniSONE (DELTASONE) 5 MG tablet Take 5 mg by mouth daily with breakfast.    . scopolamine (TRANSDERM-SCOP) 1 MG/3DAYS Place 1 patch (1.5 mg total) onto the skin every 3 (three) days. Behind ear 10 patch 0  . sertraline (ZOLOFT) 50 MG tablet TAKE 1 & 1/2 (ONE & ONE-HALF) TABLETS BY MOUTH ONCE DAILY 135 tablet 0  . zolpidem (AMBIEN) 10 MG tablet TAKE 1 TABLET BY MOUTH AT BEDTIME AS NEEDED FOR SLEEP 90 tablet 1  . cephALEXin (KEFLEX) 500 MG capsule Take 1 capsule (500 mg total) by mouth 2 (two) times daily. 20 capsule 0  . cyclobenzaprine (FLEXERIL) 5 MG tablet Take 5 mg by mouth 2 (two) times daily.     No facility-administered  medications prior to visit.    Allergies  Allergen Reactions  . Bee Venom Anaphylaxis  . Codeine Sulfate Other (See Comments)    GI upset  . Keflex [Cephalexin] Rash  . Penicillins Rash    ROS Review of Systems  Constitutional: Negative for activity change, appetite change, fatigue, fever and unexpected weight change.  HENT: Negative for ear pain, hearing loss, sore throat and trouble swallowing.   Eyes: Negative for visual disturbance.  Respiratory: Negative for cough and shortness of breath.   Cardiovascular: Negative for chest pain and palpitations.  Gastrointestinal: Negative for abdominal pain, blood in stool, constipation and diarrhea.  Genitourinary: Negative for dysuria and hematuria.  Musculoskeletal: Negative for back pain and myalgias.  Skin: Negative for rash.  Neurological: Negative for dizziness, syncope and headaches.  Hematological: Negative for adenopathy.  Psychiatric/Behavioral: Negative for confusion and dysphoric mood.      Objective:    Physical Exam Constitutional:      Appearance: She is well-developed.  HENT:     Head: Normocephalic and atraumatic.  Eyes:     Pupils: Pupils are equal, round, and reactive to light.  Neck:     Thyroid: No thyromegaly.  Cardiovascular:     Rate and Rhythm: Normal rate and regular rhythm.     Heart sounds: Normal heart sounds. No murmur heard.   Pulmonary:     Effort: No respiratory distress.     Breath sounds: Normal breath sounds. No wheezing or rales.  Abdominal:     General: Bowel sounds are normal. There is no distension.     Palpations: Abdomen  is soft. There is no mass.     Tenderness: There is no abdominal tenderness. There is no guarding or rebound.  Musculoskeletal:        General: Normal range of motion.     Cervical back: Normal range of motion and neck supple.  Lymphadenopathy:     Cervical: No cervical adenopathy.  Skin:    Findings: No rash.  Neurological:     Mental Status: She is alert  and oriented to person, place, and time.     Cranial Nerves: No cranial nerve deficit.     Deep Tendon Reflexes: Reflexes normal.  Psychiatric:        Behavior: Behavior normal.        Thought Content: Thought content normal.        Judgment: Judgment normal.     BP 134/72   Pulse 85   Temp 98.2 F (36.8 C)   Ht 5\' 4"  (1.626 m)   Wt 214 lb 9.6 oz (97.3 kg)   SpO2 98%   BMI 36.84 kg/m  Wt Readings from Last 3 Encounters:  12/05/19 214 lb 9.6 oz (97.3 kg)  08/09/18 213 lb 14.4 oz (97 kg)  02/01/18 212 lb (96.2 kg)     There are no preventive care reminders to display for this patient.  There are no preventive care reminders to display for this patient.  Lab Results  Component Value Date   TSH 2.18 06/15/2016   Lab Results  Component Value Date   WBC 5.7 01/30/2018   HGB 12.4 01/30/2018   HCT 38 01/30/2018   MCV 93.7 12/24/2016   PLT 336 01/30/2018   Lab Results  Component Value Date   NA 142 01/30/2018   K 3.9 01/30/2018   CO2 25 09/26/2017   GLUCOSE 95 09/26/2017   BUN 18 01/30/2018   CREATININE 0.6 01/30/2018   BILITOT 0.2 09/26/2017   ALKPHOS 73 01/30/2018   AST 18 01/30/2018   ALT 13 01/30/2018   PROT 6.4 09/26/2017   ALBUMIN 4.0 09/26/2017   CALCIUM 9.3 09/26/2017   ANIONGAP 10 12/24/2016   GFR 122.98 09/23/2014   Lab Results  Component Value Date   CHOL 236 (H) 05/16/2017   Lab Results  Component Value Date   HDL 80.90 05/16/2017   Lab Results  Component Value Date   LDLCALC 134 (H) 05/16/2017   Lab Results  Component Value Date   TRIG 106.0 05/16/2017   Lab Results  Component Value Date   CHOLHDL 3 05/16/2017   No results found for: HGBA1C    Assessment & Plan:   Physical exam.  She has chronic medical problems as above.  She does have very strong family history of CAD.  We need to follow-up lipids and consider more aggressive treatment of lipids given her family history.  -Flu vaccine given -Obtain follow-up labs including  chemistries, lipid, B12 level in view of her prior low levels and gastric sleeve surgery -We recommended decreasing Estrace to 0.5 mg once daily for 30 days and then discontinue -We recommend a 9-month office follow-up.  We will plan to schedule CT lung at that time to follow-up 8 mm nodule from previous study at [facility] which was incidentally noted -Consider tetanus booster at that follow-up visit  Meds ordered this encounter  Medications  . estradiol (ESTRACE) 0.5 MG tablet    Sig: Take 1 tablet (0.5 mg total) by mouth daily.    Dispense:  30 tablet    Refill:  0    Follow-up: Return in about 5 months (around 05/04/2020).    Carolann Littler, MD

## 2019-12-05 NOTE — Patient Instructions (Addendum)
Reduce the Estrace to 0.5 mg daily for one month and then discontinue  Set up 5 month follow up .  We need to get follow up CT chest at that time.     If you have lab work done today you will be contacted with your lab results within the next 2 weeks.  If you have not heard from [place] then please contact us. The fastest way to get your results is to register for My Chart.   IF you received an x-ray today, you will receive an invoice from [facility]. Please contact [facility] at [phone] with questions or concerns regarding your invoice.   IF you received labwork today, you will receive an invoice from [facility]. Please contact [facility] at [phone] with questions or concerns regarding your invoice.   Our billing staff will not be able to assist you with questions regarding bills from these companies.  You will be contacted with the lab results as soon as they are available. The fastest way to get your results is to activate your My Chart account. Instructions are located on the last page of this paperwork. If you have not heard from [place] regarding the results in 2 weeks, please contact this office.

## 2019-12-06 ENCOUNTER — Ambulatory Visit: Payer: Medicare Other | Admitting: Physical Therapy

## 2019-12-06 ENCOUNTER — Encounter: Payer: Self-pay | Admitting: Physical Therapy

## 2019-12-06 ENCOUNTER — Other Ambulatory Visit: Payer: Self-pay

## 2019-12-06 DIAGNOSIS — R600 Localized edema: Secondary | ICD-10-CM | POA: Diagnosis not present

## 2019-12-06 DIAGNOSIS — M6281 Muscle weakness (generalized): Secondary | ICD-10-CM | POA: Diagnosis not present

## 2019-12-06 DIAGNOSIS — M25612 Stiffness of left shoulder, not elsewhere classified: Secondary | ICD-10-CM | POA: Diagnosis not present

## 2019-12-06 DIAGNOSIS — M25512 Pain in left shoulder: Secondary | ICD-10-CM | POA: Diagnosis not present

## 2019-12-06 LAB — COMPLETE METABOLIC PANEL WITH GFR
AG Ratio: 1.8 (calc) (ref 1.0–2.5)
ALT: 11 U/L (ref 6–29)
AST: 16 U/L (ref 10–35)
Albumin: 3.9 g/dL (ref 3.6–5.1)
Alkaline phosphatase (APISO): 85 U/L (ref 37–153)
BUN: 14 mg/dL (ref 7–25)
CO2: 24 mmol/L (ref 20–32)
Calcium: 9.4 mg/dL (ref 8.6–10.4)
Chloride: 104 mmol/L (ref 98–110)
Creat: 0.52 mg/dL (ref 0.50–0.99)
GFR, Est African American: 118 mL/min/{1.73_m2} (ref >=60)
GFR, Est Non African American: 102 mL/min/{1.73_m2} (ref >=60)
Globulin: 2.2 g/dL (calc) (ref 1.9–3.7)
Glucose, Bld: 90 mg/dL (ref 65–99)
Potassium: 4.1 mmol/L (ref 3.5–5.3)
Sodium: 138 mmol/L (ref 135–146)
Total Bilirubin: 0.5 mg/dL (ref 0.2–1.2)
Total Protein: 6.1 g/dL (ref 6.1–8.1)

## 2019-12-06 LAB — LIPID PANEL
Cholesterol: 220 mg/dL — ABNORMAL HIGH (ref <200)
HDL: 84 mg/dL (ref >=50)
LDL Cholesterol (Calc): 113 mg/dL (calc) — ABNORMAL HIGH
Non-HDL Cholesterol (Calc): 136 mg/dL (calc) — ABNORMAL HIGH (ref <130)
Total CHOL/HDL Ratio: 2.6 (calc) (ref <5.0)
Triglycerides: 124 mg/dL (ref <150)

## 2019-12-06 LAB — TSH: TSH: 2.3 mIU/L (ref 0.40–4.50)

## 2019-12-06 LAB — VITAMIN B12: Vitamin B-12: 361 pg/mL (ref 200–1100)

## 2019-12-06 NOTE — Therapy (Signed)
[facility] [place], [place], [address] Phone: [phone]   Fax:  [phone]  Physical Therapy Treatment  Patient Details  Name: [name] MRN: [id number] Date of Birth: May 24, 1955 Referring Provider (PT): Justice Britain, MD   Encounter Date: 12/06/2019   PT End of Session - 12/06/19 1222    Visit Number 4    Number of Visits 4    Date for PT Re-Evaluation 12/17/19    Authorization Type FOTO (1st visit: 78% limitation); Progress note every 10th visit; KX modifier after 15t visit, (13 visits used this year)    PT Start Time 1115    PT Stop Time 1204    PT Time Calculation (min) 49 min    Equipment Utilized During Treatment Other (comment)   R sling with ABD pillow   Activity Tolerance Patient tolerated treatment well    Behavior During Therapy WFL for tasks assessed/performed           Past Medical History:  Diagnosis Date  . ANGIOMA 10/03/2008   REMOVED FROM RIGHT LOWER LEG-BENIGN  . Anxiety   . Arthritis    RA  . Bronchitis    hx of   . Cancer (HCC)    BASAL CELL SKIN CANCER  . Complication of anesthesia   . Fibromyalgia   . GERD 08/27/2008  . History of blood transfusion    1995  . History of hiatal hernia    hx of has had surgically repaired  . History of measles   . History of mumps   . History of nonmelanoma skin cancer   . HYPERLIPIDEMIA 08/27/2008  . HYPERTENSION 08/27/2008   pt is currently on no meds   . Pneumonia    hx of   . PONV (postoperative nausea and vomiting)   . Rheumatoid arthritis ([place])   . SEBORRHEIC KERATOSIS 08/27/2008    Past Surgical History:  Procedure Laterality Date  . ABDOMINAL HYSTERECTOMY  1995  . APPENDECTOMY    . BREAST BIOPSY Left   . BREAST DUCTAL SYSTEM EXCISION  08/16/2011   Procedure: EXCISION DUCTAL SYSTEM BREAST;  Surgeon: Joyice Faster. Cornett, MD;  Location: [facility];  Service: General;  Laterality: Left;  left breast duct excision  .  [place]   times 2; 1980 and 1982  . [place]  . CHOLECYSTECTOMY  1997  . EXCISION/RELEASE BURSA HIP Right 10/21/2014   Procedure: OPEN RIGHT HIP BURSECTOMY, EXOSECTOMY;  Surgeon: Paralee Cancel, MD;  [facility];  Service: Orthopedics;  Laterality: Right;  . foot surgery  2004, 2005, 2008   bilat secondary to neuropathy   . JOINT REPLACEMENT  10,12   lt total knee X2  . KNEE ARTHROSCOPY  2007, 2008, 2009  . LAPAROSCOPIC GASTRIC SLEEVE RESECTION N/A 10/30/2013   Procedure: LAPAROSCOPIC GASTRIC SLEEVE RESECTION WITH HIATAL HERNIA REPAIR AND UPPER ENDOSCOPY;  Surgeon: Greer Pickerel, MD;  [facility];  Service: General;  Laterality: N/A;  . NASAL SINUS SURGERY  2013  . OPEN SURGICAL REPAIR OF GLUTEAL TENDON  01/17/2012   Procedure: OPEN SURGICAL REPAIR OF GLUTEAL TENDON;  Surgeon: Mauri Pole, MD;  [facility];  Service: Orthopedics;  Laterality: Right;    There were no vitals filed for this visit.   Subjective Assessment - 12/06/19 1228    Subjective COVID-19 screening performed upon arrival. Reports shoulder is feeling good today more pain due to RA in hands    Pertinent  History Left shoulder RTC repair with SAD and DCR 11/02/2019; R shoulder RTC repair 07/09/2019; HTN, RA, Osteoporosis    Limitations Lifting;House hold activities    Patient Stated Goals decrease pain, improve movement, use arm normally    Currently in Pain? No/denies              [facility] PT Assessment - 12/06/19 0001      Assessment   Medical Diagnosis Impingement syndrome of left shoulder region    Referring Provider (PT) Justice Britain, MD    Onset Date/Surgical Date 11/02/19    Hand Dominance Right    Next MD Visit 12/07/2019    Prior Therapy for right shoulder      Precautions   Precautions Shoulder    Type of Shoulder Precautions PROM only for 4 weeks; See Dr. Susie Cassette Protocol      PROM   PROM Assessment Site Shoulder    Right/Left Shoulder Left    Left Shoulder  Flexion 130 Degrees    Left Shoulder Internal Rotation 80 Degrees    Left Shoulder External Rotation 52 Degrees                         [facility] Adult PT Treatment/Exercise - 12/06/19 0001      Vasopneumatic   Number Minutes Vasopneumatic  10 minutes    Vasopnuematic Location  Shoulder    Vasopneumatic Pressure Low    Vasopneumatic Temperature  34      Manual Therapy   Manual Therapy Passive ROM    Manual therapy comments gentle STW/M thoughout PROM to L shoulder to decrease pain    Passive ROM Left shoulder PROM into flexion, ER, IR, and abd with intermittent oscillations to decrease pain and guarding                    PT Short Term Goals - 11/21/19 1241      PT SHORT TERM GOAL #1   Title Patient will demonstrate 90+ degrees of left shoulder flexion PROM to improve ROM    Time 2    Period Weeks    Status Achieved      PT SHORT TERM GOAL #2   Title Patient will demonstrate 30+ degrees of left shoulder ER to improve ROM    Time 2    Period Weeks    Status Achieved             PT Long Term Goals - 12/06/19 1226      PT LONG TERM GOAL #1   Title Patient will be independent with HEP    Time 4    Period Weeks    Status Achieved      PT LONG TERM GOAL #2   Title Patient will demonstate 140+ degrees of left shoulder flexion AROM to improve ability to perform overhead tasks    Time 4    Period Weeks    Status On-going      PT LONG TERM GOAL #3   Title Patient will demonstrate 60+ degrees of left shoulder ER AROM to improve donning and doffing apparel.    Time 4    Period Weeks    Status On-going      PT LONG TERM GOAL #4   Title Patient will demonstrate 4/5 or greater left shoulder MMT in all planes to improve stability during functional tasks.    Time 4    Period Weeks    Status Unable to  assess      PT LONG TERM GOAL #5   Title Patient will report ability to perform ADLs and gardening activities with left shoulder pain less than or  equal to 3/10.    Time 4    Period Weeks    Status On-going                 Plan - 12/06/19 1223    Clinical Impression Statement Patient responded well to therapy session but with increased tightness toward end range. Patient noted with intermittent guarding; oscillations provided to decrease guarding. Improvements in PROM noted, see measurements. Patient to see MD for follow up tomorrow. No adverse affects upon removal of modalities.    Personal Factors and Comorbidities Comorbidity 3+    Comorbidities Left shoulder RTC repair with SAD and DCR 11/02/2019; R shoulder RTC repair 07/09/2019; HTN, RA, Osteoporosis    Examination-Activity Limitations Bed Mobility    Stability/Clinical Decision Making Stable/Uncomplicated    Clinical Decision Making Low    Rehab Potential Excellent    PT Frequency 1x / week    PT Duration 4 weeks    PT Treatment/Interventions Cryotherapy;Electrical Stimulation;ADLs/Self Care Home Management;Iontophoresis 4mg /ml Dexamethasone;Moist Heat;Ultrasound;Therapeutic activities;Therapeutic exercise;Neuromuscular re-education;Manual techniques;Passive range of motion;Patient/family education;Vasopneumatic Device;Taping    PT Next Visit Plan MD note; Left shoulder PROM only for 4 weeks per MD referral. Modalities PRN for pain relief.    PT Home Exercise Plan see patient education section    Consulted and Agree with Plan of Care Patient           Patient will benefit from skilled therapeutic intervention in order to improve the following deficits and impairments:  Decreased activity tolerance, Decreased strength, Decreased range of motion, Impaired UE functional use, Pain  Visit Diagnosis: Acute pain of left shoulder  Stiffness of left shoulder, not elsewhere classified  Muscle weakness (generalized)  Localized edema     Problem List Patient Active Problem List   Diagnosis Date Noted  . Pulmonary nodules 12/05/2019  . Bilateral occipital neuralgia  09/26/2017  . Status post left foot surgery 07/07/2015  . Neuroma 05/20/2015  . HAV (hallux abducto valgus) 05/20/2015  . Hammertoe 05/20/2015  . Prominent metatarsal head 05/20/2015  . Chronic foot pain 05/20/2015  . Anxiety state 03/03/2015  . Hip bursitis 10/22/2014  . Osteophyte of hip 10/21/2014  . Epigastric cramping 11/09/2013  . Nausea with vomiting 11/06/2013  . Rheumatoid arthritis ([place]) 11/01/2013  . S/P laparoscopic sleeve gastrectomy 10/30/2013  . Anemia 06/04/2013  . CAP (community acquired pneumonia) 02/17/2013  . Restless leg syndrome 09/18/2012  . Morbid obesity with BMI of 45.0-49.9, adult ([place]) 01/18/2012  . Right gluteus tear 01/17/2012  . Nipple discharge in female 07/08/2011  . Chronic insomnia 06/18/2011  . Discharge from nipple 01/06/2011  . TMJ PAIN 01/12/2010  . DEPRESSION 12/10/2009  . SINUSITIS, ACUTE 12/30/2008  . Lumbago 12/30/2008  . EDEMA 10/03/2008  . Hyperlipidemia 08/27/2008  . Essential hypertension 08/27/2008  . GERD 08/27/2008    Gabriela Eves, PT, DPT 12/06/2019, 12:29 PM  [facility] [address] [place], [place], [address] Phone: [phone]   Fax:  [phone]  Name: [name] MRN: [id number] Date of Birth: 04/29/1955

## 2019-12-10 DIAGNOSIS — R490 Dysphonia: Secondary | ICD-10-CM | POA: Diagnosis not present

## 2019-12-10 DIAGNOSIS — J385 Laryngeal spasm: Secondary | ICD-10-CM | POA: Diagnosis not present

## 2019-12-10 DIAGNOSIS — J383 Other diseases of vocal cords: Secondary | ICD-10-CM | POA: Diagnosis not present

## 2019-12-10 DIAGNOSIS — J3801 Paralysis of vocal cords and larynx, unilateral: Secondary | ICD-10-CM | POA: Diagnosis not present

## 2019-12-10 DIAGNOSIS — Z87891 Personal history of nicotine dependence: Secondary | ICD-10-CM | POA: Diagnosis not present

## 2019-12-11 DIAGNOSIS — M25512 Pain in left shoulder: Secondary | ICD-10-CM | POA: Diagnosis not present

## 2019-12-11 DIAGNOSIS — M542 Cervicalgia: Secondary | ICD-10-CM | POA: Diagnosis not present

## 2019-12-11 DIAGNOSIS — M0589 Other rheumatoid arthritis with rheumatoid factor of multiple sites: Secondary | ICD-10-CM | POA: Diagnosis not present

## 2019-12-11 DIAGNOSIS — M797 Fibromyalgia: Secondary | ICD-10-CM | POA: Diagnosis not present

## 2019-12-11 DIAGNOSIS — M150 Primary generalized (osteo)arthritis: Secondary | ICD-10-CM | POA: Diagnosis not present

## 2019-12-12 ENCOUNTER — Encounter: Payer: Self-pay | Admitting: Physical Therapy

## 2019-12-12 ENCOUNTER — Ambulatory Visit: Payer: Medicare Other | Admitting: Physical Therapy

## 2019-12-12 ENCOUNTER — Other Ambulatory Visit: Payer: Self-pay

## 2019-12-12 DIAGNOSIS — M25612 Stiffness of left shoulder, not elsewhere classified: Secondary | ICD-10-CM

## 2019-12-12 DIAGNOSIS — M6281 Muscle weakness (generalized): Secondary | ICD-10-CM

## 2019-12-12 DIAGNOSIS — R600 Localized edema: Secondary | ICD-10-CM

## 2019-12-12 DIAGNOSIS — M25512 Pain in left shoulder: Secondary | ICD-10-CM | POA: Diagnosis not present

## 2019-12-12 NOTE — Therapy (Signed)
[facility] [place], [place], [address] Phone: [phone]   Fax:  [phone]  Physical Therapy Treatment  Patient Details  Name: [name] MRN: [id number] Date of Birth: 1955-09-22 Referring Provider (PT): Justice Britain, MD   Encounter Date: 12/12/2019   PT End of Session - 12/12/19 1037    Visit Number 5    Number of Visits 12   per new order   Date for PT Re-Evaluation 01/25/20    Authorization Type FOTO (1st visit: 78% limitation); Progress note every 10th visit; KX modifier after 15t visit, (13 visits used this year)    PT Start Time 1030    PT Stop Time 1120    PT Time Calculation (min) 50 min    Activity Tolerance Patient tolerated treatment well    Behavior During Therapy WFL for tasks assessed/performed           Past Medical History:  Diagnosis Date  . ANGIOMA 10/03/2008   REMOVED FROM RIGHT LOWER LEG-BENIGN  . Anxiety   . Arthritis    RA  . Bronchitis    hx of   . Cancer (HCC)    BASAL CELL SKIN CANCER  . Complication of anesthesia   . Fibromyalgia   . GERD 08/27/2008  . History of blood transfusion    1995  . History of hiatal hernia    hx of has had surgically repaired  . History of measles   . History of mumps   . History of nonmelanoma skin cancer   . HYPERLIPIDEMIA 08/27/2008  . HYPERTENSION 08/27/2008   pt is currently on no meds   . Pneumonia    hx of   . PONV (postoperative nausea and vomiting)   . Rheumatoid arthritis ([place])   . SEBORRHEIC KERATOSIS 08/27/2008    Past Surgical History:  Procedure Laterality Date  . ABDOMINAL HYSTERECTOMY  1995  . APPENDECTOMY    . BREAST BIOPSY Left   . BREAST DUCTAL SYSTEM EXCISION  08/16/2011   Procedure: EXCISION DUCTAL SYSTEM BREAST;  Surgeon: Joyice Faster. Cornett, MD;  Location: [facility];  Service: General;  Laterality: Left;  left breast duct excision  . [place]   times 2; 1980 and 1982  . [place]  . CHOLECYSTECTOMY  1997  . EXCISION/RELEASE BURSA HIP Right 10/21/2014   Procedure: OPEN RIGHT HIP BURSECTOMY, EXOSECTOMY;  Surgeon: Paralee Cancel, MD;  Location: [facility];  Service: Orthopedics;  Laterality: Right;  . foot surgery  2004, 2005, 2008   bilat secondary to neuropathy   . JOINT REPLACEMENT  10,12   lt total knee X2  . KNEE ARTHROSCOPY  2007, 2008, 2009  . LAPAROSCOPIC GASTRIC SLEEVE RESECTION N/A 10/30/2013   Procedure: LAPAROSCOPIC GASTRIC SLEEVE RESECTION WITH HIATAL HERNIA REPAIR AND UPPER ENDOSCOPY;  Surgeon: Greer Pickerel, MD;  Location: [facility];  Service: General;  Laterality: N/A;  . NASAL SINUS SURGERY  2013  . OPEN SURGICAL REPAIR OF GLUTEAL TENDON  01/17/2012   Procedure: OPEN SURGICAL REPAIR OF GLUTEAL TENDON;  Surgeon: Mauri Pole, MD;  Location: [facility];  Service: Orthopedics;  Laterality: Right;    There were no vitals filed for this visit.   Subjective Assessment - 12/12/19 1035    Subjective COVID-19 screening performed upon arrival. Patient reports MD follow up went well and doctor is pleased with progress. Patient out of the sling. 4/10 dull, achey pain today.    Pertinent History  Left shoulder RTC repair with SAD and DCR 11/02/2019; R shoulder RTC repair 07/09/2019; HTN, RA, Osteoporosis    Limitations Lifting;House hold activities    Patient Stated Goals decrease pain, improve movement, use arm normally    Currently in Pain? Yes    Pain Score 4     Pain Location Shoulder    Pain Orientation Left    Pain Descriptors / Indicators Aching;Dull    Pain Onset More than a month ago    Pain Frequency Constant              [facility] PT Assessment - 12/12/19 0001      Assessment   Medical Diagnosis Impingement syndrome of left shoulder region    Referring Provider (PT) Justice Britain, MD    Onset Date/Surgical Date 11/02/19    Hand Dominance Right    Next MD Visit 12/07/2019    Prior Therapy for right shoulder      Precautions   Precautions  Shoulder    Type of Shoulder Precautions PROM only for 4 weeks; See Dr. Susie Cassette Protocol                         [facility] Adult PT Treatment/Exercise - 12/12/19 0001      Exercises   Exercises Shoulder      Shoulder Exercises: Pulleys   Flexion 5 minutes      Shoulder Exercises: ROM/Strengthening   Ranger seated ranger flexion, CW, CCW x2 mins      Modalities   Modalities Vasopneumatic      Vasopneumatic   Number Minutes Vasopneumatic  10 minutes    Vasopnuematic Location  Shoulder    Vasopneumatic Pressure Low    Vasopneumatic Temperature  34      Manual Therapy   Manual Therapy Passive ROM    Manual therapy comments gentle STW/M thoughout PROM to L shoulder to decrease pain    Passive ROM Left shoulder PROM into flexion, ER, IR, and abd with intermittent oscillations to decrease pain and guarding                    PT Short Term Goals - 11/21/19 1241      PT SHORT TERM GOAL #1   Title Patient will demonstrate 90+ degrees of left shoulder flexion PROM to improve ROM    Time 2    Period Weeks    Status Achieved      PT SHORT TERM GOAL #2   Title Patient will demonstrate 30+ degrees of left shoulder ER to improve ROM    Time 2    Period Weeks    Status Achieved             PT Long Term Goals - 12/06/19 1226      PT LONG TERM GOAL #1   Title Patient will be independent with HEP    Time 4    Period Weeks    Status Achieved      PT LONG TERM GOAL #2   Title Patient will demonstate 140+ degrees of left shoulder flexion AROM to improve ability to perform overhead tasks    Time 4    Period Weeks    Status On-going      PT LONG TERM GOAL #3   Title Patient will demonstrate 60+ degrees of left shoulder ER AROM to improve donning and doffing apparel.    Time 4    Period Weeks  Status On-going      PT LONG TERM GOAL #4   Title Patient will demonstrate 4/5 or greater left shoulder MMT in all planes to improve stability during  functional tasks.    Time 4    Period Weeks    Status Unable to assess      PT LONG TERM GOAL #5   Title Patient will report ability to perform ADLs and gardening activities with left shoulder pain less than or equal to 3/10.    Time 4    Period Weeks    Status On-going                 Plan - 12/12/19 1231    Clinical Impression Statement Patient responded well to the addition of L shoulder AAROM TEs. Patient demonstrated good form and technique but required intermittent rest breaks secondary to soreness and fatigue. Patient provided with updated HEP to which patient reported understanding. Smooth arcs of motion noted with PROM, still limited with ER and flexion but making improvements. No adverse affects upon removal of modalities.    Personal Factors and Comorbidities Comorbidity 3+    Comorbidities Left shoulder RTC repair with SAD and DCR 11/02/2019; R shoulder RTC repair 07/09/2019; HTN, RA, Osteoporosis    Examination-Activity Limitations Bed Mobility    Stability/Clinical Decision Making Stable/Uncomplicated    Clinical Decision Making Low    Rehab Potential Excellent    PT Frequency 1x / week    PT Duration 4 weeks    PT Treatment/Interventions Cryotherapy;Electrical Stimulation;ADLs/Self Care Home Management;Iontophoresis 4mg /ml Dexamethasone;Moist Heat;Ultrasound;Therapeutic activities;Therapeutic exercise;Neuromuscular re-education;Manual techniques;Passive range of motion;Patient/family education;Vasopneumatic Device;Taping    PT Next Visit Plan MD note; Left shoulder PROM only for 4 weeks per MD referral. Modalities PRN for pain relief.    PT Home Exercise Plan see patient education section    Consulted and Agree with Plan of Care Patient           Patient will benefit from skilled therapeutic intervention in order to improve the following deficits and impairments:  Decreased activity tolerance, Decreased strength, Decreased range of motion, Impaired UE functional  use, Pain  Visit Diagnosis: Acute pain of left shoulder  Stiffness of left shoulder, not elsewhere classified  Muscle weakness (generalized)  Localized edema     Problem List Patient Active Problem List   Diagnosis Date Noted  . Pulmonary nodules 12/05/2019  . Bilateral occipital neuralgia 09/26/2017  . Status post left foot surgery 07/07/2015  . Neuroma 05/20/2015  . HAV (hallux abducto valgus) 05/20/2015  . Hammertoe 05/20/2015  . Prominent metatarsal head 05/20/2015  . Chronic foot pain 05/20/2015  . Anxiety state 03/03/2015  . Hip bursitis 10/22/2014  . Osteophyte of hip 10/21/2014  . Epigastric cramping 11/09/2013  . Nausea with vomiting 11/06/2013  . Rheumatoid arthritis ([place]) 11/01/2013  . S/P laparoscopic sleeve gastrectomy 10/30/2013  . Anemia 06/04/2013  . CAP (community acquired pneumonia) 02/17/2013  . Restless leg syndrome 09/18/2012  . Morbid obesity with BMI of 45.0-49.9, adult ([place]) 01/18/2012  . Right gluteus tear 01/17/2012  . Nipple discharge in female 07/08/2011  . Chronic insomnia 06/18/2011  . Discharge from nipple 01/06/2011  . TMJ PAIN 01/12/2010  . DEPRESSION 12/10/2009  . SINUSITIS, ACUTE 12/30/2008  . Lumbago 12/30/2008  . EDEMA 10/03/2008  . Hyperlipidemia 08/27/2008  . Essential hypertension 08/27/2008  . GERD 08/27/2008    Gabriela Eves, PT, DPT 12/12/2019, 1:17 PM  [facility] [place], [place],  [place] Phone: [phone]   Fax:  [phone]  Name: Kymari Lollis MRN: [id number] Date of Birth: Mar 20, 1955

## 2019-12-13 ENCOUNTER — Ambulatory Visit: Payer: Medicare Other | Admitting: *Deleted

## 2019-12-13 DIAGNOSIS — M25612 Stiffness of left shoulder, not elsewhere classified: Secondary | ICD-10-CM

## 2019-12-13 DIAGNOSIS — M1711 Unilateral primary osteoarthritis, right knee: Secondary | ICD-10-CM | POA: Diagnosis not present

## 2019-12-13 DIAGNOSIS — M25512 Pain in left shoulder: Secondary | ICD-10-CM | POA: Diagnosis not present

## 2019-12-13 DIAGNOSIS — R600 Localized edema: Secondary | ICD-10-CM | POA: Diagnosis not present

## 2019-12-13 DIAGNOSIS — M6281 Muscle weakness (generalized): Secondary | ICD-10-CM | POA: Diagnosis not present

## 2019-12-13 NOTE — Therapy (Signed)
[facility] [place], [place], [address] Phone: [phone]   Fax:  [phone]  Physical Therapy Treatment  Patient Details  Name: [name] MRN: [id number] Date of Birth: 1955-06-29 Referring Provider (PT): Justice Britain, MD   Encounter Date: 12/13/2019   PT End of Session - 12/13/19 1205    Visit Number 6    Number of Visits 12    Date for PT Re-Evaluation 01/25/20    Authorization Type FOTO (1st visit: 78% limitation); Progress note every 10th visit; KX modifier after 15t visit, (13 visits used this year)    PT Start Time 1115    PT Stop Time 1205    PT Time Calculation (min) 50 min           Past Medical History:  Diagnosis Date  . ANGIOMA 10/03/2008   REMOVED FROM RIGHT LOWER LEG-BENIGN  . Anxiety   . Arthritis    RA  . Bronchitis    hx of   . Cancer (HCC)    BASAL CELL SKIN CANCER  . Complication of anesthesia   . Fibromyalgia   . GERD 08/27/2008  . History of blood transfusion    1995  . History of hiatal hernia    hx of has had surgically repaired  . History of measles   . History of mumps   . History of nonmelanoma skin cancer   . HYPERLIPIDEMIA 08/27/2008  . HYPERTENSION 08/27/2008   pt is currently on no meds   . Pneumonia    hx of   . PONV (postoperative nausea and vomiting)   . Rheumatoid arthritis ([place])   . SEBORRHEIC KERATOSIS 08/27/2008    Past Surgical History:  Procedure Laterality Date  . ABDOMINAL HYSTERECTOMY  1995  . APPENDECTOMY    . BREAST BIOPSY Left   . BREAST DUCTAL SYSTEM EXCISION  08/16/2011   Procedure: EXCISION DUCTAL SYSTEM BREAST;  Surgeon: Joyice Faster. Cornett, MD;  Location: [facility];  Service: General;  Laterality: Left;  left breast duct excision  . [place]   times 2; 1980 and 1982  . [place]  . CHOLECYSTECTOMY  1997  . EXCISION/RELEASE BURSA HIP Right 10/21/2014   Procedure: OPEN RIGHT HIP BURSECTOMY,  EXOSECTOMY;  Surgeon: Paralee Cancel, MD;  [facility];  Service: Orthopedics;  Laterality: Right;  . foot surgery  2004, 2005, 2008   bilat secondary to neuropathy   . JOINT REPLACEMENT  10,12   lt total knee X2  . KNEE ARTHROSCOPY  2007, 2008, 2009  . LAPAROSCOPIC GASTRIC SLEEVE RESECTION N/A 10/30/2013   Procedure: LAPAROSCOPIC GASTRIC SLEEVE RESECTION WITH HIATAL HERNIA REPAIR AND UPPER ENDOSCOPY;  Surgeon: Greer Pickerel, MD;  [facility];  Service: General;  Laterality: N/A;  . NASAL SINUS SURGERY  2013  . OPEN SURGICAL REPAIR OF GLUTEAL TENDON  01/17/2012   Procedure: OPEN SURGICAL REPAIR OF GLUTEAL TENDON;  Surgeon: Mauri Pole, MD;  [facility];  Service: Orthopedics;  Laterality: Right;    There were no vitals filed for this visit.   Subjective Assessment - 12/13/19 1204    Subjective COVID-19 screening performed upon arrival. Patient reports MD follow up went well and doctor is pleased with progress.  A little sore after last  RX    Pertinent History Left shoulder RTC repair with SAD and DCR 11/02/2019; R shoulder RTC repair 07/09/2019; HTN, RA, Osteoporosis    Limitations Lifting;House hold activities  Patient Stated Goals decrease pain, improve movement, use arm normally    Currently in Pain? Yes    Pain Score 4     Pain Location Shoulder    Pain Orientation Left    Pain Descriptors / Indicators Aching;Dull    Pain Type Surgical pain    Pain Onset More than a month ago                             [facility] Adult PT Treatment/Exercise - 12/13/19 0001      Exercises   Exercises Shoulder      Shoulder Exercises: Pulleys   Flexion 5 minutes      Shoulder Exercises: ROM/Strengthening   Ranger seated ranger flexion, CW, CCW x5 mins      Modalities   Modalities Vasopneumatic      Vasopneumatic   Number Minutes Vasopneumatic  10 minutes    Vasopnuematic Location  Shoulder    Vasopneumatic Pressure Low    Vasopneumatic Temperature  34       Manual Therapy   Manual Therapy Passive ROM    Manual therapy comments ER 50 degrees, elevation to 125 degrees    Passive ROM Left shoulder PROM into flexion, ER, IR, and abd with intermittent oscillations to decrease pain and guarding                    PT Short Term Goals - 11/21/19 1241      PT SHORT TERM GOAL #1   Title Patient will demonstrate 90+ degrees of left shoulder flexion PROM to improve ROM    Time 2    Period Weeks    Status Achieved      PT SHORT TERM GOAL #2   Title Patient will demonstrate 30+ degrees of left shoulder ER to improve ROM    Time 2    Period Weeks    Status Achieved             PT Long Term Goals - 12/06/19 1226      PT LONG TERM GOAL #1   Title Patient will be independent with HEP    Time 4    Period Weeks    Status Achieved      PT LONG TERM GOAL #2   Title Patient will demonstate 140+ degrees of left shoulder flexion AROM to improve ability to perform overhead tasks    Time 4    Period Weeks    Status On-going      PT LONG TERM GOAL #3   Title Patient will demonstrate 60+ degrees of left shoulder ER AROM to improve donning and doffing apparel.    Time 4    Period Weeks    Status On-going      PT LONG TERM GOAL #4   Title Patient will demonstrate 4/5 or greater left shoulder MMT in all planes to improve stability during functional tasks.    Time 4    Period Weeks    Status Unable to assess      PT LONG TERM GOAL #5   Title Patient will report ability to perform ADLs and gardening activities with left shoulder pain less than or equal to 3/10.    Time 4    Period Weeks    Status On-going                 Plan - 12/13/19 1208    Clinical Impression  Statement Pt arrived today doing fairly well with mainly LT shldr soreness. She did well again with AAROM exs f/b PROM. PROM today ER to 50 degrees and elevation to to 125 degrees . Normal Vaso response.    Personal Factors and Comorbidities Comorbidity 3+     Comorbidities Left shoulder RTC repair with SAD and DCR 11/02/2019; R shoulder RTC repair 07/09/2019; HTN, RA, Osteoporosis    Stability/Clinical Decision Making Stable/Uncomplicated    Rehab Potential Excellent    PT Frequency 1x / week    PT Duration 4 weeks    PT Treatment/Interventions Cryotherapy;Electrical Stimulation;ADLs/Self Care Home Management;Iontophoresis 4mg /ml Dexamethasone;Moist Heat;Ultrasound;Therapeutic activities;Therapeutic exercise;Neuromuscular re-education;Manual techniques;Passive range of motion;Patient/family education;Vasopneumatic Device;Taping    PT Next Visit Plan MD note; Left shoulder PROM only for 4 weeks per MD referral. Modalities PRN for pain relief.    PT Home Exercise Plan see patient education section    Consulted and Agree with Plan of Care Patient           Patient will benefit from skilled therapeutic intervention in order to improve the following deficits and impairments:  Decreased activity tolerance, Decreased strength, Decreased range of motion, Impaired UE functional use, Pain  Visit Diagnosis: Acute pain of left shoulder  Stiffness of left shoulder, not elsewhere classified  Muscle weakness (generalized)     Problem List Patient Active Problem List   Diagnosis Date Noted  . Pulmonary nodules 12/05/2019  . Bilateral occipital neuralgia 09/26/2017  . Status post left foot surgery 07/07/2015  . Neuroma 05/20/2015  . HAV (hallux abducto valgus) 05/20/2015  . Hammertoe 05/20/2015  . Prominent metatarsal head 05/20/2015  . Chronic foot pain 05/20/2015  . Anxiety state 03/03/2015  . Hip bursitis 10/22/2014  . Osteophyte of hip 10/21/2014  . Epigastric cramping 11/09/2013  . Nausea with vomiting 11/06/2013  . Rheumatoid arthritis ([place]) 11/01/2013  . S/P laparoscopic sleeve gastrectomy 10/30/2013  . Anemia 06/04/2013  . CAP (community acquired pneumonia) 02/17/2013  . Restless leg syndrome 09/18/2012  . Morbid obesity with BMI of  45.0-49.9, adult ([place]) 01/18/2012  . Right gluteus tear 01/17/2012  . Nipple discharge in female 07/08/2011  . Chronic insomnia 06/18/2011  . Discharge from nipple 01/06/2011  . TMJ PAIN 01/12/2010  . DEPRESSION 12/10/2009  . SINUSITIS, ACUTE 12/30/2008  . Lumbago 12/30/2008  . EDEMA 10/03/2008  . Hyperlipidemia 08/27/2008  . Essential hypertension 08/27/2008  . GERD 08/27/2008    Nichole Neyer,CHRIS, PTA 12/13/2019, 12:12 PM  [facility] [address] [place], [place], [address] Phone: [phone]   Fax:  [phone]  Name: Taraneh Metheney MRN: [id number] Date of Birth: 1955-09-13

## 2019-12-18 ENCOUNTER — Other Ambulatory Visit: Payer: Self-pay | Admitting: Family Medicine

## 2019-12-18 ENCOUNTER — Encounter: Payer: Medicare Other | Admitting: Physical Therapy

## 2019-12-19 ENCOUNTER — Telehealth: Payer: Medicare Other | Admitting: Nurse Practitioner

## 2019-12-19 DIAGNOSIS — L509 Urticaria, unspecified: Secondary | ICD-10-CM

## 2019-12-19 NOTE — Progress Notes (Signed)
Based on what you shared with me it looks like you have urticaria, which is a rash due to some allergy. ,that should be evaluated in a face to face office visit. This is usually due to something you have ate or drank. Could even being due to a specific dye in food or drink. You need another steroid injection.so call and make an appointment with your PCP or go to nearest urgent care.    NOTE: If you entered your credit card information for this eVisit, you will not be charged. You may see a "hold" on your card for the $35 but that hold will drop off and you will not have a charge processed.  If you are having a true medical emergency please call 911.     For an urgent face to face visit, [facility] has four urgent care centers for your convenience:   . [facility]    [phone]                  Get Driving Directions  [address], [place] [address] . 10 am to 8 pm Monday-Friday . 12 pm to 8 pm Saturday-Sunday   . [facility] at [place]                  Get Driving Directions  [address], [address] [place], [place] [address] . 8 am to 8 pm Monday-Friday . 9 am to 6 pm Saturday . 11 am to 6 pm Sunday   . [facility] at [place]                  Get Driving Directions   [address] [place], [place] [address] . 8 am to 8 pm Monday-Friday . 8 am to 4 pm Saturday-Sunday    . [facility] at [place]                    Get Driving Directions  [phone]  [address] [place], [place] [address]  . Monday-Friday, 12 PM to 6 PM    Your e-visit answers were reviewed by a board certified advanced clinical practitioner to complete your personal care plan.  Thank you for using e-Visits.

## 2019-12-20 ENCOUNTER — Ambulatory Visit: Payer: Medicare Other | Admitting: Physical Therapy

## 2019-12-24 ENCOUNTER — Other Ambulatory Visit: Payer: Self-pay

## 2019-12-24 ENCOUNTER — Encounter: Payer: Self-pay | Admitting: Family Medicine

## 2019-12-24 ENCOUNTER — Ambulatory Visit (INDEPENDENT_AMBULATORY_CARE_PROVIDER_SITE_OTHER): Payer: Medicare Other | Admitting: Family Medicine

## 2019-12-24 VITALS — BP 132/80 | HR 67 | Temp 98.2°F | Ht 64.0 in | Wt 210.6 lb

## 2019-12-24 DIAGNOSIS — R350 Frequency of micturition: Secondary | ICD-10-CM | POA: Diagnosis not present

## 2019-12-24 DIAGNOSIS — D2372 Other benign neoplasm of skin of left lower limb, including hip: Secondary | ICD-10-CM | POA: Diagnosis not present

## 2019-12-24 DIAGNOSIS — R55 Syncope and collapse: Secondary | ICD-10-CM | POA: Diagnosis not present

## 2019-12-24 DIAGNOSIS — L570 Actinic keratosis: Secondary | ICD-10-CM | POA: Diagnosis not present

## 2019-12-24 DIAGNOSIS — R110 Nausea: Secondary | ICD-10-CM | POA: Diagnosis not present

## 2019-12-24 DIAGNOSIS — D3612 Benign neoplasm of peripheral nerves and autonomic nervous system, upper limb, including shoulder: Secondary | ICD-10-CM | POA: Diagnosis not present

## 2019-12-24 DIAGNOSIS — R0789 Other chest pain: Secondary | ICD-10-CM

## 2019-12-24 DIAGNOSIS — Z85828 Personal history of other malignant neoplasm of skin: Secondary | ICD-10-CM | POA: Diagnosis not present

## 2019-12-24 DIAGNOSIS — D225 Melanocytic nevi of trunk: Secondary | ICD-10-CM | POA: Diagnosis not present

## 2019-12-24 MED ORDER — PROMETHAZINE HCL 25 MG PO TABS: 25.0000 mg | ORAL_TABLET | Freq: Three times a day (TID) | ORAL | 0 refills | Status: DC | PRN

## 2019-12-24 NOTE — Progress Notes (Signed)
Established Patient Office Visit  Subjective:  Patient ID: [name], female    DOB: 08-16-1955  Age: 64 y.o. MRN: [id number]  CC:  Chief Complaint  Patient presents with  . Fatigue    fatigue, nausea , passing out x 1 week ,rash on back on  abd , back     HPI [name] presents for 1 week history of "feeling bad ".  She states that she first felt poorly last Monday.  She noticed some intermittent nausea.  Over the week is had a few episodes of nausea with vomiting.  She had previous cholecystectomy.  No abdominal pain.  She has had more fatigue than usual.  No fever.  Last Thursday she drove up to [place] and states that she felt very "hot "all over and was aware of some nausea and had possible brief loss of consciousness.  She initially described this as "falling asleep "but then later felt like this may have been a brief syncopal episode.  She does not have any history of seizures.  She has had previous abdominal surgeries including appendectomy and cholecystectomy.  Previous hysterectomy.  History of gastric sleeve surgery  She has had several episodes during the past week of intermittent palpitations and sensation of irregular heartbeat at times.  She is also had some reported chest pain which she described as a "stabbing "pain lasting about 5 minutes.  Not clear if this is a lot associated with her nausea and vomiting.  Patient relates pain sometimes radiates between the scapula to the back region.   She has wakened a couple times with pain.  She has not noted any recent exertional pattern but is fairly sedentary.  She has very strong family history of CAD.  She states she had 3 sisters who have had cardiac events around age 55 as well as mom and 75 year old son.  She does have history of mild hyperlipidemia.  She had been on statin years ago but not clear why she came off.  Non-smoker.  No history of diabetes.  Also relates intermittent skin rash past couple weeks.   She shows me a picture on her phone and this looks like classic urticaria mostly on the trunk.  She does not describe any angioedema symptoms.  Her skin rash is fairly transient.  No clear triggers.  Past Medical History:  Diagnosis Date  . ANGIOMA 10/03/2008   REMOVED FROM RIGHT LOWER LEG-BENIGN  . Anxiety   . Arthritis    RA  . Bronchitis    hx of   . Cancer (HCC)    BASAL CELL SKIN CANCER  . Complication of anesthesia   . Fibromyalgia   . GERD 08/27/2008  . History of blood transfusion    1995  . History of hiatal hernia    hx of has had surgically repaired  . History of measles   . History of mumps   . History of nonmelanoma skin cancer   . HYPERLIPIDEMIA 08/27/2008  . HYPERTENSION 08/27/2008   pt is currently on no meds   . Pneumonia    hx of   . PONV (postoperative nausea and vomiting)   . Rheumatoid arthritis ([place])   . SEBORRHEIC KERATOSIS 08/27/2008    Past Surgical History:  Procedure Laterality Date  . ABDOMINAL HYSTERECTOMY  1995  . APPENDECTOMY    . BREAST BIOPSY Left   . BREAST DUCTAL SYSTEM EXCISION  08/16/2011   Procedure: EXCISION DUCTAL SYSTEM BREAST;  Surgeon: Joyice Faster.  Cornett, MD;  Location: [facility];  Service: General;  Laterality: Left;  left breast duct excision  . [place]   times 2; 1980 and 1982  . [place]  . CHOLECYSTECTOMY  1997  . EXCISION/RELEASE BURSA HIP Right 10/21/2014   Procedure: OPEN RIGHT HIP BURSECTOMY, EXOSECTOMY;  Surgeon: Paralee Cancel, MD;  Location[facility];  Service: Orthopedics;  Laterality: Right;  . foot surgery  2004, 2005, 2008   bilat secondary to neuropathy   . JOINT REPLACEMENT  10,12   lt total knee X2  . KNEE ARTHROSCOPY  2007, 2008, 2009  . LAPAROSCOPIC GASTRIC SLEEVE RESECTION N/A 10/30/2013   Procedure: LAPAROSCOPIC GASTRIC SLEEVE RESECTION WITH HIATAL HERNIA REPAIR AND UPPER ENDOSCOPY;  Surgeon: Greer Pickerel, MD;  Location[facility];  Service: General;  Laterality: N/A;  . NASAL  SINUS SURGERY  2013  . OPEN SURGICAL REPAIR OF GLUTEAL TENDON  01/17/2012   Procedure: OPEN SURGICAL REPAIR OF GLUTEAL TENDON;  Surgeon: Mauri Pole, MD;  [facility];  Service: Orthopedics;  Laterality: Right;    Family History  Problem Relation Age of Onset  . Cancer Mother        breast  . Breast cancer Mother 38  . Kidney disease Father   . Epilepsy Father   . Heart attack Sister   . Heart attack Sister   . Diabetes Son     Social History   Socioeconomic History  . Marital status: Married    Spouse name: Not on file  . Number of children: 2  . Years of education: Not on file  . Highest education level: High school graduate  Occupational History  . Occupation: retired    Comment: Building services engineer  Tobacco Use  . Smoking status: Former Smoker    Packs/day: 0.25    Years: 4.00    Pack years: 1.00    Types: Cigarettes    Quit date: 02/23/1984    Years since quitting: 35.8  . Smokeless tobacco: Never Used  Vaping Use  . Vaping Use: Never used  Substance and Sexual Activity  . Alcohol use: No  . Drug use: No  . Sexual activity: Not on file  Other Topics Concern  . Not on file  Social History Narrative   Lives at home with husband   Disabled   Right handed   Caffeine: 1 cup of coffee daily   Social Determinants of Health   Financial Resource Strain:   . Difficulty of Paying Living Expenses: Not on file  Food Insecurity:   . Worried About Charity fundraiser in the Last Year: Not on file  . Ran Out of Food in the Last Year: Not on file  Transportation Needs:   . Lack of Transportation (Medical): Not on file  . Lack of Transportation (Non-Medical): Not on file  Physical Activity:   . Days of Exercise per Week: Not on file  . Minutes of Exercise per Session: Not on file  Stress:   . Feeling of Stress : Not on file  Social Connections:   . Frequency of Communication with Friends and Family: Not on file  . Frequency of Social Gatherings with  Friends and Family: Not on file  . Attends Religious Services: Not on file  . Active Member of Clubs or Organizations: Not on file  . Attends Archivist Meetings: Not on file  . Marital Status: Not on file  Intimate Partner Violence:   .  Fear of Current or Ex-Partner: Not on file  . Emotionally Abused: Not on file  . Physically Abused: Not on file  . Sexually Abused: Not on file    Outpatient Medications Prior to Visit  Medication Sig Dispense Refill  . acetaminophen (TYLENOL) 500 MG tablet Take 1,000 mg by mouth every 6 (six) hours as needed for pain.     . Biotin 1000 MCG tablet Take 3,000 mcg by mouth daily.    Marland Kitchen EPINEPHrine (EPIPEN 2-PAK) 0.3 mg/0.3 mL IJ SOAJ injection Inject 0.3 mLs (0.3 mg total) into the muscle once. 2 each 1  . estradiol (ESTRACE) 0.5 MG tablet Take 1 tablet (0.5 mg total) by mouth daily. 30 tablet 0  . etanercept (ENBREL) 50 MG/ML injection Inject 0.98 mLs (50 mg total) into the skin once a week. Resume end of next week (Patient taking differently: Inject 50 mg into the skin once a week. Friday) 7.79 mL 0  . folic acid (FOLVITE) 1 MG tablet Take 2 mg by mouth daily.     . hydroxychloroquine (PLAQUENIL) 200 MG tablet TAKE 1 TABLET BY MOUTH ONCE DAILY WITH FOOD OR MILK FOR 90 DAYS    . lisinopril (ZESTRIL) 10 MG tablet Take 1 tablet by mouth once daily 90 tablet 0  . methotrexate (50 MG/ML) 1 gm SOLR May resume previous dose at  end of next week (Patient taking differently: Inject 50 mg into the skin once a week. Friday)    . oxyCODONE-acetaminophen (PERCOCET/ROXICET) 5-325 MG tablet TAKE 1 TABLET AS NEEDED EVERY 8 HOURS FOR PAIN  0  . predniSONE (DELTASONE) 5 MG tablet Take 5 mg by mouth daily with breakfast.    . scopolamine (TRANSDERM-SCOP) 1 MG/3DAYS Place 1 patch (1.5 mg total) onto the skin every 3 (three) days. Behind ear 10 patch 0  . sertraline (ZOLOFT) 50 MG tablet TAKE 1 & 1/2 (ONE & ONE-HALF) TABLETS BY MOUTH ONCE DAILY 135 tablet 0  .  zolpidem (AMBIEN) 10 MG tablet TAKE 1 TABLET BY MOUTH AT BEDTIME AS NEEDED FOR SLEEP 90 tablet 1  . ondansetron (ZOFRAN ODT) 4 MG disintegrating tablet 4mg  ODT q4 hours prn nausea/vomit 20 tablet 0  . estradiol (ESTRACE) 1 MG tablet Take 1 tablet by mouth once daily 90 tablet 0   No facility-administered medications prior to visit.    Allergies  Allergen Reactions  . Bee Venom Anaphylaxis  . Codeine Sulfate Other (See Comments)    GI upset  . Keflex [Cephalexin] Rash  . Penicillins Rash    ROS Review of Systems  Constitutional: Positive for fatigue. Negative for chills and fever.  Eyes: Negative for visual disturbance.  Respiratory: Negative for cough, shortness of breath and wheezing.   Cardiovascular: Positive for chest pain and palpitations. Negative for leg swelling.  Gastrointestinal: Positive for nausea and vomiting. Negative for abdominal pain and diarrhea.  Genitourinary: Positive for frequency. Negative for dysuria.  Neurological: Negative for seizures, facial asymmetry, speech difficulty and headaches.       See HPI.  Possible syncopal episodes during the past week  Hematological: Negative for adenopathy.  Psychiatric/Behavioral: Negative for confusion.      Objective:    Physical Exam Vitals reviewed.  Constitutional:      Appearance: Normal appearance.  Cardiovascular:     Rate and Rhythm: Normal rate and regular rhythm.  Pulmonary:     Effort: Pulmonary effort is normal.     Breath sounds: Normal breath sounds.  Abdominal:     Palpations:  Abdomen is soft.     Comments: Mild midepigastric tenderness.  No guarding or rebound.  Musculoskeletal:     Cervical back: Neck supple.     Right lower leg: No edema.     Left lower leg: No edema.  Neurological:     Mental Status: She is alert.     BP 132/80 (BP Location: Left Arm, Patient Position: Sitting, Cuff Size: Large)   Pulse 67   Temp 98.2 F (36.8 C)   Ht 5\' 4"  (1.626 m)   Wt 210 lb 9.6 oz (95.5 kg)    SpO2 97%   BMI 36.15 kg/m  Wt Readings from Last 3 Encounters:  12/24/19 210 lb 9.6 oz (95.5 kg)  12/05/19 214 lb 9.6 oz (97.3 kg)  08/09/18 213 lb 14.4 oz (97 kg)     There are no preventive care reminders to display for this patient.  There are no preventive care reminders to display for this patient.  Lab Results  Component Value Date   TSH 2.30 12/05/2019   Lab Results  Component Value Date   WBC 5.7 01/30/2018   HGB 12.4 01/30/2018   HCT 38 01/30/2018   MCV 93.7 12/24/2016   PLT 336 01/30/2018   Lab Results  Component Value Date   NA 138 12/05/2019   K 4.1 12/05/2019   CO2 24 12/05/2019   GLUCOSE 90 12/05/2019   BUN 14 12/05/2019   CREATININE 0.52 12/05/2019   BILITOT 0.5 12/05/2019   ALKPHOS 73 01/30/2018   AST 16 12/05/2019   ALT 11 12/05/2019   PROT 6.1 12/05/2019   ALBUMIN 4.0 09/26/2017   CALCIUM 9.4 12/05/2019   ANIONGAP 10 12/24/2016   GFR 122.98 09/23/2014   Lab Results  Component Value Date   CHOL 220 (H) 12/05/2019   Lab Results  Component Value Date   HDL 84 12/05/2019   Lab Results  Component Value Date   LDLCALC 113 (H) 12/05/2019   Lab Results  Component Value Date   TRIG 124 12/05/2019   Lab Results  Component Value Date   CHOLHDL 2.6 12/05/2019   No results found for: HGBA1C    Assessment & Plan:   Patient relates nonspecific symptoms during the past week of intermittent nausea and vomiting, intermittent palpitations, possible transient syncope, and intermittent nonexertional atypical chest pain.  She does not describe likely seizure activity.  She has very strong family history of premature CAD as above.  Currently pain-free.  EKG in office shows sinus rhythm with no acute ST changes  -Avoid exertional activities until further evaluated -Setting up urgent cardiac evaluation.  Her symptoms are very atypical but she has fairly high risk overall -Consider baby aspirin use until further evaluated -Call 911 or go to the ER  immediately for any persistent or crescendo type chest pain -Check labs including CBC, basic metabolic panel, urinalysis -We discussed the fact that she should probably be on a statin given her family history.  Meds ordered this encounter  Medications  . promethazine (PHENERGAN) 25 MG tablet    Sig: Take 1 tablet (25 mg total) by mouth every 8 (eight) hours as needed for nausea or vomiting.    Dispense:  15 tablet    Refill:  0    Follow-up: No follow-ups on file.    Carolann Littler, MD

## 2019-12-24 NOTE — Patient Instructions (Signed)

## 2019-12-25 LAB — CBC WITH DIFFERENTIAL/PLATELET
Absolute Monocytes: 443 cells/uL (ref 200–950)
Basophils Absolute: 16 cells/uL (ref 0–200)
Basophils Relative: 0.2 %
Eosinophils Absolute: 82 cells/uL (ref 15–500)
Eosinophils Relative: 1 %
HCT: 41.8 % (ref 35.0–45.0)
Hemoglobin: 13.6 g/dL (ref 11.7–15.5)
Lymphs Abs: 451 cells/uL — ABNORMAL LOW (ref 850–3900)
MCH: 30.4 pg (ref 27.0–33.0)
MCHC: 32.5 g/dL (ref 32.0–36.0)
MCV: 93.3 fL (ref 80.0–100.0)
MPV: 9.8 fL (ref 7.5–12.5)
Monocytes Relative: 5.4 %
Neutro Abs: 7208 cells/uL (ref 1500–7800)
Neutrophils Relative %: 87.9 %
Platelets: 325 10*3/uL (ref 140–400)
RBC: 4.48 10*6/uL (ref 3.80–5.10)
RDW: 13.3 % (ref 11.0–15.0)
Total Lymphocyte: 5.5 %
WBC: 8.2 10*3/uL (ref 3.8–10.8)

## 2019-12-25 LAB — URINALYSIS
Bilirubin Urine: NEGATIVE
Glucose, UA: NEGATIVE
Ketones, ur: NEGATIVE
Nitrite: NEGATIVE
Protein, ur: NEGATIVE
Specific Gravity, Urine: 1.021 (ref 1.001–1.03)
pH: 5.5 (ref 5.0–8.0)

## 2019-12-25 LAB — BASIC METABOLIC PANEL
BUN: 15 mg/dL (ref 7–25)
CO2: 26 mmol/L (ref 20–32)
Calcium: 9.5 mg/dL (ref 8.6–10.4)
Chloride: 104 mmol/L (ref 98–110)
Creat: 0.55 mg/dL (ref 0.50–0.99)
Glucose, Bld: 88 mg/dL (ref 65–99)
Potassium: 4 mmol/L (ref 3.5–5.3)
Sodium: 141 mmol/L (ref 135–146)

## 2019-12-25 LAB — LIPASE: Lipase: 11 U/L (ref 7–60)

## 2019-12-26 ENCOUNTER — Other Ambulatory Visit: Payer: Medicare Other

## 2019-12-26 ENCOUNTER — Ambulatory Visit: Payer: Medicare Other | Attending: Orthopedic Surgery | Admitting: Physical Therapy

## 2019-12-26 ENCOUNTER — Other Ambulatory Visit: Payer: Self-pay

## 2019-12-26 DIAGNOSIS — M25512 Pain in left shoulder: Secondary | ICD-10-CM | POA: Insufficient documentation

## 2019-12-26 DIAGNOSIS — M6281 Muscle weakness (generalized): Secondary | ICD-10-CM | POA: Insufficient documentation

## 2019-12-26 DIAGNOSIS — R319 Hematuria, unspecified: Secondary | ICD-10-CM | POA: Diagnosis not present

## 2019-12-26 DIAGNOSIS — R600 Localized edema: Secondary | ICD-10-CM

## 2019-12-26 DIAGNOSIS — N390 Urinary tract infection, site not specified: Secondary | ICD-10-CM | POA: Diagnosis not present

## 2019-12-26 DIAGNOSIS — M25612 Stiffness of left shoulder, not elsewhere classified: Secondary | ICD-10-CM | POA: Insufficient documentation

## 2019-12-26 NOTE — Progress Notes (Signed)
Per last lab results I created order for C&S per PCP/thx dmf

## 2019-12-26 NOTE — Therapy (Signed)
[facility] [place], [place], [address] Phone: [phone]   Fax:  [phone]  Physical Therapy Treatment  Patient Details  Name: [name] MRN: [id number] Date of Birth: 21-Mar-1955 Referring Provider (PT): Justice Britain, MD   Encounter Date: 12/26/2019   PT End of Session - 12/26/19 1024    Visit Number 7    Number of Visits 12    Date for PT Re-Evaluation 01/25/20    Authorization Type FOTO (7th visit: 57% limitation); Progress note every 10th visit; KX modifier after 15t visit, (13 visits used this year)    PT Start Time 0945    PT Stop Time 1029    PT Time Calculation (min) 44 min    Activity Tolerance Patient tolerated treatment well    Behavior During Therapy WFL for tasks assessed/performed           Past Medical History:  Diagnosis Date  . ANGIOMA 10/03/2008   REMOVED FROM RIGHT LOWER LEG-BENIGN  . Anxiety   . Arthritis    RA  . Bronchitis    hx of   . Cancer (HCC)    BASAL CELL SKIN CANCER  . Complication of anesthesia   . Fibromyalgia   . GERD 08/27/2008  . History of blood transfusion    1995  . History of hiatal hernia    hx of has had surgically repaired  . History of measles   . History of mumps   . History of nonmelanoma skin cancer   . HYPERLIPIDEMIA 08/27/2008  . HYPERTENSION 08/27/2008   pt is currently on no meds   . Pneumonia    hx of   . PONV (postoperative nausea and vomiting)   . Rheumatoid arthritis ([place])   . SEBORRHEIC KERATOSIS 08/27/2008    Past Surgical History:  Procedure Laterality Date  . ABDOMINAL HYSTERECTOMY  1995  . APPENDECTOMY    . BREAST BIOPSY Left   . BREAST DUCTAL SYSTEM EXCISION  08/16/2011   Procedure: EXCISION DUCTAL SYSTEM BREAST;  Surgeon: Joyice Faster. Cornett, MD;  Location: [facility];  Service: General;  Laterality: Left;  left breast duct excision  . [place]   times 2; 1980 and 1982  . [place]  .  CHOLECYSTECTOMY  1997  . EXCISION/RELEASE BURSA HIP Right 10/21/2014   Procedure: OPEN RIGHT HIP BURSECTOMY, EXOSECTOMY;  Surgeon: Paralee Cancel, MD;  [facility];  Service: Orthopedics;  Laterality: Right;  . foot surgery  2004, 2005, 2008   bilat secondary to neuropathy   . JOINT REPLACEMENT  10,12   lt total knee X2  . KNEE ARTHROSCOPY  2007, 2008, 2009  . LAPAROSCOPIC GASTRIC SLEEVE RESECTION N/A 10/30/2013   Procedure: LAPAROSCOPIC GASTRIC SLEEVE RESECTION WITH HIATAL HERNIA REPAIR AND UPPER ENDOSCOPY;  Surgeon: Greer Pickerel, MD;  Location[facility];  Service: General;  Laterality: N/A;  . NASAL SINUS SURGERY  2013  . OPEN SURGICAL REPAIR OF GLUTEAL TENDON  01/17/2012   Procedure: OPEN SURGICAL REPAIR OF GLUTEAL TENDON;  Surgeon: Mauri Pole, MD;  [facility];  Service: Orthopedics;  Laterality: Right;    There were no vitals filed for this visit.   Subjective Assessment - 12/26/19 0946    Subjective COVID-19 screening performed upon arrival. Patient arrived with some ongoing soreness    Pertinent History Left shoulder RTC repair with SAD and DCR 11/02/2019; R shoulder RTC repair 07/09/2019; HTN, RA, Osteoporosis    Limitations  Lifting;House hold activities    Patient Stated Goals decrease pain, improve movement, use arm normally    Currently in Pain? Yes    Pain Score 2     Pain Location Shoulder    Pain Orientation Left    Pain Descriptors / Indicators Dull;Discomfort    Pain Type Surgical pain    Pain Onset More than a month ago    Pain Frequency Constant    Aggravating Factors  prolong movement    Pain Relieving Factors at rest              [facility] PT Assessment - 12/26/19 0001      AROM   AROM Assessment Site Shoulder    Right/Left Shoulder Left    Left Shoulder External Rotation 50 Degrees      PROM   PROM Assessment Site Shoulder    Right/Left Shoulder Left    Left Shoulder Flexion 130 Degrees    Left Shoulder Internal Rotation 60 Degrees                          [facility] Adult PT Treatment/Exercise - 12/26/19 0001      Shoulder Exercises: Supine   Other Supine Exercises supine cane for flexion and ER active assistive x10-15 each      Shoulder Exercises: Pulleys   Flexion 5 minutes    Other Pulley Exercises wall walk x5 up to #23      Shoulder Exercises: Isometric Strengthening   Flexion 5X5"    Extension 5X5"    External Rotation 5X5"    Internal Rotation 5X5"      Vasopneumatic   Number Minutes Vasopneumatic  10 minutes    Vasopnuematic Location  Shoulder    Vasopneumatic Pressure Low    Vasopneumatic Temperature  34      Manual Therapy   Manual Therapy Passive ROM    Manual therapy comments rhythmic stabs for IR/ER    Passive ROM Left shoulder PROM into flexion, ER, IR, and abd with intermittent oscillations to decrease pain and guarding                    PT Short Term Goals - 11/21/19 1241      PT SHORT TERM GOAL #1   Title Patient will demonstrate 90+ degrees of left shoulder flexion PROM to improve ROM    Time 2    Period Weeks    Status Achieved      PT SHORT TERM GOAL #2   Title Patient will demonstrate 30+ degrees of left shoulder ER to improve ROM    Time 2    Period Weeks    Status Achieved             PT Long Term Goals - 12/26/19 0160      PT LONG TERM GOAL #1   Title Patient will be independent with HEP    Time 4    Period Weeks    Status Achieved      PT LONG TERM GOAL #2   Title Patient will demonstate 140+ degrees of left shoulder flexion AROM to improve ability to perform overhead tasks    Time 4    Period Weeks    Status On-going      PT LONG TERM GOAL #3   Title Patient will demonstrate 60+ degrees of left shoulder ER AROM to improve donning and doffing apparel.    Baseline AROM 50 degrees  12/26/19    Time 4    Period Weeks    Status On-going      PT LONG TERM GOAL #4   Title Patient will demonstrate 4/5 or greater left shoulder MMT in all planes to  improve stability during functional tasks.    Time 4    Period Weeks    Status On-going      PT LONG TERM GOAL #5   Title Patient will report ability to perform ADLs and gardening activities with left shoulder pain less than or equal to 3/10.    Baseline limited with ADL's due to healing and protocol 12/26/19    Time 4    Period Weeks    Status On-going                 Plan - 12/26/19 1030    Clinical Impression Statement Patient tolerated treatment well today. Patient progressing with active assistive exercises and PROM for left shoulder. Today started gentle isometrics perfromed in standing with 5 reps each. Patient current goals progressing for 7 weeks post op. Patient has ongoing pain and restrictions at this time.    Personal Factors and Comorbidities Comorbidity 3+    Comorbidities Left shoulder RTC repair with SAD and DCR 11/02/2019; R shoulder RTC repair 07/09/2019; HTN, RA, Osteoporosis    Examination-Activity Limitations Bed Mobility    Stability/Clinical Decision Making Stable/Uncomplicated    Rehab Potential Excellent    PT Frequency 1x / week    PT Duration 4 weeks    PT Treatment/Interventions Cryotherapy;Electrical Stimulation;ADLs/Self Care Home Management;Iontophoresis 4mg /ml Dexamethasone;Moist Heat;Ultrasound;Therapeutic activities;Therapeutic exercise;Neuromuscular re-education;Manual techniques;Passive range of motion;Patient/family education;Vasopneumatic Device;Taping    PT Next Visit Plan cont with POC per protocol (8 weeks 12/28/19)    Consulted and Agree with Plan of Care Patient           Patient will benefit from skilled therapeutic intervention in order to improve the following deficits and impairments:  Decreased activity tolerance, Decreased strength, Decreased range of motion, Impaired UE functional use, Pain  Visit Diagnosis: Stiffness of left shoulder, not elsewhere classified  Acute pain of left shoulder  Muscle weakness  (generalized)  Localized edema     Problem List Patient Active Problem List   Diagnosis Date Noted  . Pulmonary nodules 12/05/2019  . Bilateral occipital neuralgia 09/26/2017  . Status post left foot surgery 07/07/2015  . Neuroma 05/20/2015  . HAV (hallux abducto valgus) 05/20/2015  . Hammertoe 05/20/2015  . Prominent metatarsal head 05/20/2015  . Chronic foot pain 05/20/2015  . Anxiety state 03/03/2015  . Hip bursitis 10/22/2014  . Osteophyte of hip 10/21/2014  . Epigastric cramping 11/09/2013  . Nausea with vomiting 11/06/2013  . Rheumatoid arthritis ([place]) 11/01/2013  . S/P laparoscopic sleeve gastrectomy 10/30/2013  . Anemia 06/04/2013  . CAP (community acquired pneumonia) 02/17/2013  . Restless leg syndrome 09/18/2012  . Morbid obesity with BMI of 45.0-49.9, adult ([place]) 01/18/2012  . Right gluteus tear 01/17/2012  . Nipple discharge in female 07/08/2011  . Chronic insomnia 06/18/2011  . Discharge from nipple 01/06/2011  . TMJ PAIN 01/12/2010  . DEPRESSION 12/10/2009  . SINUSITIS, ACUTE 12/30/2008  . Lumbago 12/30/2008  . EDEMA 10/03/2008  . Hyperlipidemia 08/27/2008  . Essential hypertension 08/27/2008  . GERD 08/27/2008    Phillips Climes, PTA 12/26/2019, 10:44 AM  [facility] [place], [place], [address] Phone: [phone]   Fax:  [phone]  Name: [name] MRN: [id number] Date of  Birth: 18-Sep-1955

## 2019-12-26 NOTE — Addendum Note (Signed)
Addended by: Marrion Coy on: 12/26/2019 11:40 AM   Modules accepted: Orders

## 2019-12-27 LAB — URINE CULTURE
MICRO NUMBER:: 11154888
SPECIMEN QUALITY:: ADEQUATE

## 2019-12-31 DIAGNOSIS — Z23 Encounter for immunization: Secondary | ICD-10-CM | POA: Diagnosis not present

## 2020-01-01 ENCOUNTER — Ambulatory Visit: Payer: Medicare Other | Admitting: Physical Therapy

## 2020-01-01 ENCOUNTER — Encounter: Payer: Self-pay | Admitting: Physical Therapy

## 2020-01-01 ENCOUNTER — Other Ambulatory Visit: Payer: Self-pay

## 2020-01-01 DIAGNOSIS — M25612 Stiffness of left shoulder, not elsewhere classified: Secondary | ICD-10-CM

## 2020-01-01 DIAGNOSIS — M25512 Pain in left shoulder: Secondary | ICD-10-CM

## 2020-01-01 DIAGNOSIS — R600 Localized edema: Secondary | ICD-10-CM

## 2020-01-01 DIAGNOSIS — M6281 Muscle weakness (generalized): Secondary | ICD-10-CM

## 2020-01-01 NOTE — Therapy (Signed)
[facility] [place], [place], [address] Phone: [phone]   Fax:  [phone]  Physical Therapy Treatment  Patient Details  Name: [name] MRN: [id number] Date of Birth: 1956-02-13 Referring Provider (PT): Justice Britain, MD   Encounter Date: 01/01/2020   PT End of Session - 01/01/20 0948    Visit Number 8    Number of Visits 12    Date for PT Re-Evaluation 01/25/20    Authorization Type FOTO (7th visit: 57% limitation); Progress note every 10th visit; KX modifier after 15t visit, (13 visits used this year)    PT Start Time [phone]    PT Stop Time 1028    PT Time Calculation (min) 41 min    Activity Tolerance Patient tolerated treatment well    Behavior During Therapy WFL for tasks assessed/performed           Past Medical History:  Diagnosis Date  . ANGIOMA 10/03/2008   REMOVED FROM RIGHT LOWER LEG-BENIGN  . Anxiety   . Arthritis    RA  . Bronchitis    hx of   . Cancer (HCC)    BASAL CELL SKIN CANCER  . Complication of anesthesia   . Fibromyalgia   . GERD 08/27/2008  . History of blood transfusion    1995  . History of hiatal hernia    hx of has had surgically repaired  . History of measles   . History of mumps   . History of nonmelanoma skin cancer   . HYPERLIPIDEMIA 08/27/2008  . HYPERTENSION 08/27/2008   pt is currently on no meds   . Pneumonia    hx of   . PONV (postoperative nausea and vomiting)   . Rheumatoid arthritis ([place])   . SEBORRHEIC KERATOSIS 08/27/2008    Past Surgical History:  Procedure Laterality Date  . ABDOMINAL HYSTERECTOMY  1995  . APPENDECTOMY    . BREAST BIOPSY Left   . BREAST DUCTAL SYSTEM EXCISION  08/16/2011   Procedure: EXCISION DUCTAL SYSTEM BREAST;  Surgeon: Joyice Faster. Cornett, MD;  Location: [place];  Service: General;  Laterality: Left;  left breast duct excision  . [place]   times 2; 1980 and 1982  . [place]  .  CHOLECYSTECTOMY  1997  . EXCISION/RELEASE BURSA HIP Right 10/21/2014   Procedure: OPEN RIGHT HIP BURSECTOMY, EXOSECTOMY;  Surgeon: Paralee Cancel, MD;  Location: [facility];  Service: Orthopedics;  Laterality: Right;  . foot surgery  2004, 2005, 2008   bilat secondary to neuropathy   . JOINT REPLACEMENT  10,12   lt total knee X2  . KNEE ARTHROSCOPY  2007, 2008, 2009  . LAPAROSCOPIC GASTRIC SLEEVE RESECTION N/A 10/30/2013   Procedure: LAPAROSCOPIC GASTRIC SLEEVE RESECTION WITH HIATAL HERNIA REPAIR AND UPPER ENDOSCOPY;  Surgeon: Greer Pickerel, MD;  Location: [facility];  Service: General;  Laterality: N/A;  . NASAL SINUS SURGERY  2013  . OPEN SURGICAL REPAIR OF GLUTEAL TENDON  01/17/2012   Procedure: OPEN SURGICAL REPAIR OF GLUTEAL TENDON;  Surgeon: Mauri Pole, MD;  Location: [facility];  Service: Orthopedics;  Laterality: Right;    There were no vitals filed for this visit.   Subjective Assessment - 01/01/20 0947    Subjective COVID-19 screening performed upon arrival. Patient arrived with some ongoing soreness.    Pertinent History Left shoulder RTC repair with SAD and DCR 11/02/2019; R shoulder RTC repair 07/09/2019; HTN, RA, Osteoporosis    Limitations  Lifting;House hold activities    Patient Stated Goals decrease pain, improve movement, use arm normally    Currently in Pain? Yes    Pain Score 2     Pain Location Shoulder    Pain Orientation Left    Pain Descriptors / Indicators Sore    Pain Type Surgical pain    Pain Onset More than a month ago    Pain Frequency Constant              [facility] PT Assessment - 01/01/20 0001      Assessment   Medical Diagnosis Impingement syndrome of left shoulder region    Referring Provider (PT) Justice Britain, MD    Onset Date/Surgical Date 11/02/19    Hand Dominance Right    Next MD Visit 01/03/2020    Prior Therapy for right shoulder      Precautions   Precautions Shoulder                         [facility] Adult PT Treatment/Exercise -  01/01/20 0001      Shoulder Exercises: Supine   Protraction AAROM;Both;20 reps    External Rotation AAROM;Left;20 reps    Flexion AAROM;Both;15 reps      Shoulder Exercises: Pulleys   Flexion 5 minutes    Other Pulley Exercises wall ladder into flex x5 reps (max at 21#)      Shoulder Exercises: ROM/Strengthening   Ranger in standing; flexion, CW and CCW x20 reps each      Modalities   Modalities Vasopneumatic      Vasopneumatic   Number Minutes Vasopneumatic  10 minutes    Vasopnuematic Location  Shoulder    Vasopneumatic Pressure Low    Vasopneumatic Temperature  34      Manual Therapy   Manual Therapy Passive ROM    Passive ROM Left shoulder PROM into flexion, ER, IR, with intermittent oscillations to decrease pain and guarding                    PT Short Term Goals - 11/21/19 1241      PT SHORT TERM GOAL #1   Title Patient will demonstrate 90+ degrees of left shoulder flexion PROM to improve ROM    Time 2    Period Weeks    Status Achieved      PT SHORT TERM GOAL #2   Title Patient will demonstrate 30+ degrees of left shoulder ER to improve ROM    Time 2    Period Weeks    Status Achieved             PT Long Term Goals - 12/26/19 6734      PT LONG TERM GOAL #1   Title Patient will be independent with HEP    Time 4    Period Weeks    Status Achieved      PT LONG TERM GOAL #2   Title Patient will demonstate 140+ degrees of left shoulder flexion AROM to improve ability to perform overhead tasks    Time 4    Period Weeks    Status On-going      PT LONG TERM GOAL #3   Title Patient will demonstrate 60+ degrees of left shoulder ER AROM to improve donning and doffing apparel.    Baseline AROM 50 degrees 12/26/19    Time 4    Period Weeks    Status On-going  PT LONG TERM GOAL #4   Title Patient will demonstrate 4/5 or greater left shoulder MMT in all planes to improve stability during functional tasks.    Time 4    Period Weeks     Status On-going      PT LONG TERM GOAL #5   Title Patient will report ability to perform ADLs and gardening activities with left shoulder pain less than or equal to 3/10.    Baseline limited with ADL's due to healing and protocol 12/26/19    Time 4    Period Weeks    Status On-going                 Plan - 01/01/20 1020    Clinical Impression Statement Patient presented in clinic with reports of more soreness which continued throughout therex today in antigravity. Limited with wall ladder today due to discomfort of L superior shoulder. Firm end feels and smooth arc of motion noted during PROM of L shoulder. Normal vasopneumatic response noted following removal of the modality.    Personal Factors and Comorbidities Comorbidity 3+    Comorbidities Left shoulder RTC repair with SAD and DCR 11/02/2019; R shoulder RTC repair 07/09/2019; HTN, RA, Osteoporosis    Examination-Activity Limitations Bed Mobility    Stability/Clinical Decision Making Stable/Uncomplicated    Rehab Potential Excellent    PT Frequency 1x / week    PT Duration 4 weeks    PT Treatment/Interventions Cryotherapy;Electrical Stimulation;ADLs/Self Care Home Management;Iontophoresis 4mg /ml Dexamethasone;Moist Heat;Ultrasound;Therapeutic activities;Therapeutic exercise;Neuromuscular re-education;Manual techniques;Passive range of motion;Patient/family education;Vasopneumatic Device;Taping    PT Next Visit Plan cont with POC per protocol (8 weeks 12/28/19)    PT Home Exercise Plan see patient education section    Consulted and Agree with Plan of Care Patient           Patient will benefit from skilled therapeutic intervention in order to improve the following deficits and impairments:  Decreased activity tolerance, Decreased strength, Decreased range of motion, Impaired UE functional use, Pain  Visit Diagnosis: Stiffness of left shoulder, not elsewhere classified  Acute pain of left shoulder  Muscle weakness  (generalized)  Localized edema     Problem List Patient Active Problem List   Diagnosis Date Noted  . Pulmonary nodules 12/05/2019  . Bilateral occipital neuralgia 09/26/2017  . Status post left foot surgery 07/07/2015  . Neuroma 05/20/2015  . HAV (hallux abducto valgus) 05/20/2015  . Hammertoe 05/20/2015  . Prominent metatarsal head 05/20/2015  . Chronic foot pain 05/20/2015  . Anxiety state 03/03/2015  . Hip bursitis 10/22/2014  . Osteophyte of hip 10/21/2014  . Epigastric cramping 11/09/2013  . Nausea with vomiting 11/06/2013  . Rheumatoid arthritis ([place]) 11/01/2013  . S/P laparoscopic sleeve gastrectomy 10/30/2013  . Anemia 06/04/2013  . CAP (community acquired pneumonia) 02/17/2013  . Restless leg syndrome 09/18/2012  . Morbid obesity with BMI of 45.0-49.9, adult ([place]) 01/18/2012  . Right gluteus tear 01/17/2012  . Nipple discharge in female 07/08/2011  . Chronic insomnia 06/18/2011  . Discharge from nipple 01/06/2011  . TMJ PAIN 01/12/2010  . DEPRESSION 12/10/2009  . SINUSITIS, ACUTE 12/30/2008  . Lumbago 12/30/2008  . EDEMA 10/03/2008  . Hyperlipidemia 08/27/2008  . Essential hypertension 08/27/2008  . GERD 08/27/2008    Standley Brooking, PTA 01/01/2020, 10:37 AM  [facility] [address] [place], [place], [address] Phone: [phone]   Fax:  [phone]  Name: Milaya Hora MRN: [id number] Date of Birth: August 22, 1955

## 2020-01-03 ENCOUNTER — Encounter: Payer: Self-pay | Admitting: Physical Therapy

## 2020-01-03 ENCOUNTER — Ambulatory Visit: Payer: Medicare Other | Admitting: Physical Therapy

## 2020-01-03 ENCOUNTER — Other Ambulatory Visit: Payer: Self-pay

## 2020-01-03 DIAGNOSIS — R600 Localized edema: Secondary | ICD-10-CM

## 2020-01-03 DIAGNOSIS — M25512 Pain in left shoulder: Secondary | ICD-10-CM | POA: Diagnosis not present

## 2020-01-03 DIAGNOSIS — M6281 Muscle weakness (generalized): Secondary | ICD-10-CM

## 2020-01-03 DIAGNOSIS — M25612 Stiffness of left shoulder, not elsewhere classified: Secondary | ICD-10-CM | POA: Diagnosis not present

## 2020-01-03 NOTE — Therapy (Signed)
[facility] [place], [place], [address] Phone: [phone]   Fax:  [phone]  Physical Therapy Treatment  Patient Details  Name: [name] MRN: [id number] Date of Birth: 1955/08/03 Referring Provider (PT): Justice Britain, MD   Encounter Date: 01/03/2020   PT End of Session - 01/03/20 1011    Visit Number 9    Number of Visits 12    Date for PT Re-Evaluation 01/25/20    Authorization Type FOTO (7th visit: 57% limitation); Progress note every 10th visit; KX modifier after 15t visit, (13 visits used this year)    PT Start Time 0945    PT Stop Time 1033    PT Time Calculation (min) 48 min    Activity Tolerance Patient tolerated treatment well    Behavior During Therapy WFL for tasks assessed/performed           Past Medical History:  Diagnosis Date  . ANGIOMA 10/03/2008   REMOVED FROM RIGHT LOWER LEG-BENIGN  . Anxiety   . Arthritis    RA  . Bronchitis    hx of   . Cancer (HCC)    BASAL CELL SKIN CANCER  . Complication of anesthesia   . Fibromyalgia   . GERD 08/27/2008  . History of blood transfusion    1995  . History of hiatal hernia    hx of has had surgically repaired  . History of measles   . History of mumps   . History of nonmelanoma skin cancer   . HYPERLIPIDEMIA 08/27/2008  . HYPERTENSION 08/27/2008   pt is currently on no meds   . Pneumonia    hx of   . PONV (postoperative nausea and vomiting)   . Rheumatoid arthritis ([place])   . SEBORRHEIC KERATOSIS 08/27/2008    Past Surgical History:  Procedure Laterality Date  . ABDOMINAL HYSTERECTOMY  1995  . APPENDECTOMY    . BREAST BIOPSY Left   . BREAST DUCTAL SYSTEM EXCISION  08/16/2011   Procedure: EXCISION DUCTAL SYSTEM BREAST;  Surgeon: Joyice Faster. Cornett, MD;  Location: [place];  Service: General;  Laterality: Left;  left breast duct excision  . [place]   times 2; 1980 and 1982  . [place]  .  CHOLECYSTECTOMY  1997  . EXCISION/RELEASE BURSA HIP Right 10/21/2014   Procedure: OPEN RIGHT HIP BURSECTOMY, EXOSECTOMY;  Surgeon: Paralee Cancel, MD;  Location: [facility];  Service: Orthopedics;  Laterality: Right;  . foot surgery  2004, 2005, 2008   bilat secondary to neuropathy   . JOINT REPLACEMENT  10,12   lt total knee X2  . KNEE ARTHROSCOPY  2007, 2008, 2009  . LAPAROSCOPIC GASTRIC SLEEVE RESECTION N/A 10/30/2013   Procedure: LAPAROSCOPIC GASTRIC SLEEVE RESECTION WITH HIATAL HERNIA REPAIR AND UPPER ENDOSCOPY;  Surgeon: Greer Pickerel, MD;  Location: [facility];  Service: General;  Laterality: N/A;  . NASAL SINUS SURGERY  2013  . OPEN SURGICAL REPAIR OF GLUTEAL TENDON  01/17/2012   Procedure: OPEN SURGICAL REPAIR OF GLUTEAL TENDON;  Surgeon: Mauri Pole, MD;  Location: [facility];  Service: Orthopedics;  Laterality: Right;    There were no vitals filed for this visit.   Subjective Assessment - 01/03/20 0957    Subjective COVID-19 screening performed upon arrival. Patient arrived with soreness in left AC region. States she reached into her oven with left shoulder and has been sore since.    Pertinent History Left shoulder RTC repair with  SAD and DCR 11/02/2019; R shoulder RTC repair 07/09/2019; HTN, RA, Osteoporosis    Limitations Lifting;House hold activities    Patient Stated Goals decrease pain, improve movement, use arm normally    Currently in Pain? Yes    Pain Score 3     Pain Location Shoulder    Pain Orientation Left    Pain Descriptors / Indicators Sore    Pain Type Surgical pain    Pain Onset More than a month ago    Pain Frequency Constant              [facility] PT Assessment - 01/03/20 0001      Assessment   Medical Diagnosis Impingement syndrome of left shoulder region    Referring Provider (PT) Justice Britain, MD    Onset Date/Surgical Date 11/02/19    Hand Dominance Right    Next MD Visit 01/07/2020    Prior Therapy for right shoulder      Precautions   Precautions Shoulder       AROM   Left Shoulder Flexion 130 Degrees   in supine   Left Shoulder External Rotation 50 Degrees      PROM   PROM Assessment Site Shoulder    Right/Left Shoulder Left    Left Shoulder Flexion 145 Degrees    Left Shoulder Internal Rotation 70 Degrees    Left Shoulder External Rotation 63 Degrees                         [facility] Adult PT Treatment/Exercise - 01/03/20 0001      Shoulder Exercises: Supine   Protraction AAROM;Both;20 reps    External Rotation AAROM;Left;20 reps      Shoulder Exercises: Standing   External Rotation AAROM;Left;20 reps;Other (comment)   with PVC     Shoulder Exercises: Pulleys   Flexion 5 minutes    Other Pulley Exercises wall ladder into flex x5 reps (max at 21#)      Shoulder Exercises: ROM/Strengthening   [place]anger in standing; flexion, CW and CCW x3 minutes each      Modalities   Modalities Vasopneumatic      Vasopneumatic   Number Minutes Vasopneumatic  10 minutes    Vasopnuematic Location  Shoulder    Vasopneumatic Pressure Low    Vasopneumatic Temperature  34      Manual Therapy   Manual Therapy Passive ROM    Passive ROM Left shoulder PROM into flexion, ER, IR, with intermittent oscillations to decrease pain and guarding                    PT Short Term Goals - 11/21/19 1241      PT SHORT TERM GOAL #1   Title Patient will demonstrate 90+ degrees of left shoulder flexion PROM to improve ROM    Time 2    Period Weeks    Status Achieved      PT SHORT TERM GOAL #2   Title Patient will demonstrate 30+ degrees of left shoulder ER to improve ROM    Time 2    Period Weeks    Status Achieved             PT Long Term Goals - 01/03/20 1011      PT LONG TERM GOAL #1   Title Patient will be independent with HEP    Time 4    Period Weeks    Status Achieved  PT LONG TERM GOAL #2   Title Patient will demonstate 140+ degrees of left shoulder flexion AROM to improve ability to perform overhead tasks     Time 4    Period Weeks    Status On-going      PT LONG TERM GOAL #3   Title Patient will demonstrate 60+ degrees of left shoulder ER AROM to improve donning and doffing apparel.    Baseline AROM 50 degrees 01/03/20    Time 4    Period Weeks    Status On-going      PT LONG TERM GOAL #4   Title Patient will demonstrate 4/5 or greater left shoulder MMT in all planes to improve stability during functional tasks.    Time 4    Period Weeks    Status On-going      PT LONG TERM GOAL #5   Title Patient will report ability to perform ADLs and gardening activities with left shoulder pain less than or equal to 3/10.    Baseline up to 5/10 with ADLs 01/03/2020    Time 4    Period Weeks    Status On-going                 Plan - 01/03/20 1025    Clinical Impression Statement Patient arrived to physical therapy with ongoing reports of AC joint soreness. Patient guided through TEs and was provided with intermittent rest breaks secondary to soreness. Verbal cuing to prevent shoulder hiking provided with strong carryover for remaining reps on wall ladder. Patient making improvements with ROM. Patient to see MD on 01/07/2020. No adverse affects upon removal of modalities.    Personal Factors and Comorbidities Comorbidity 3+    Comorbidities Left shoulder RTC repair with SAD and DCR 11/02/2019; R shoulder RTC repair 07/09/2019; HTN, RA, Osteoporosis    Examination-Activity Limitations Bed Mobility    Stability/Clinical Decision Making Stable/Uncomplicated    Clinical Decision Making Low    Rehab Potential Excellent    PT Frequency 1x / week    PT Duration 4 weeks    PT Treatment/Interventions Cryotherapy;Electrical Stimulation;ADLs/Self Care Home Management;Iontophoresis 4mg /ml Dexamethasone;Moist Heat;Ultrasound;Therapeutic activities;Therapeutic exercise;Neuromuscular re-education;Manual techniques;Passive range of motion;Patient/family education;Vasopneumatic Device;Taping    PT Next Visit  Plan FOTO Next visit; cont with POC per protocol (8 weeks 12/28/19)    PT Home Exercise Plan see patient education section    Consulted and Agree with Plan of Care Patient           Patient will benefit from skilled therapeutic intervention in order to improve the following deficits and impairments:  Decreased activity tolerance, Decreased strength, Decreased range of motion, Impaired UE functional use, Pain  Visit Diagnosis: Stiffness of left shoulder, not elsewhere classified  Acute pain of left shoulder  Muscle weakness (generalized)  Localized edema     Problem List Patient Active Problem List   Diagnosis Date Noted  . Pulmonary nodules 12/05/2019  . Bilateral occipital neuralgia 09/26/2017  . Status post left foot surgery 07/07/2015  . Neuroma 05/20/2015  . HAV (hallux abducto valgus) 05/20/2015  . Hammertoe 05/20/2015  . Prominent metatarsal head 05/20/2015  . Chronic foot pain 05/20/2015  . Anxiety state 03/03/2015  . Hip bursitis 10/22/2014  . Osteophyte of hip 10/21/2014  . Epigastric cramping 11/09/2013  . Nausea with vomiting 11/06/2013  . Rheumatoid arthritis ([place]) 11/01/2013  . S/P laparoscopic sleeve gastrectomy 10/30/2013  . Anemia 06/04/2013  . CAP (community acquired pneumonia) 02/17/2013  . Restless leg syndrome 09/18/2012  .  Morbid obesity with BMI of 45.0-49.9, adult ([place]) 01/18/2012  . Right gluteus tear 01/17/2012  . Nipple discharge in female 07/08/2011  . Chronic insomnia 06/18/2011  . Discharge from nipple 01/06/2011  . TMJ PAIN 01/12/2010  . DEPRESSION 12/10/2009  . SINUSITIS, ACUTE 12/30/2008  . Lumbago 12/30/2008  . EDEMA 10/03/2008  . Hyperlipidemia 08/27/2008  . Essential hypertension 08/27/2008  . GERD 08/27/2008    Gabriela Eves, PT, DPT 01/03/2020, 11:24 AM  [facility] [address] [place], [place], [address] Phone: [phone]   Fax:  [phone]  Name: Shaniquia Brafford MRN: [id number] Date of Birth: Mar 27, 1955

## 2020-01-10 ENCOUNTER — Other Ambulatory Visit: Payer: Self-pay

## 2020-01-10 ENCOUNTER — Ambulatory Visit: Payer: Medicare Other | Admitting: *Deleted

## 2020-01-10 DIAGNOSIS — M25612 Stiffness of left shoulder, not elsewhere classified: Secondary | ICD-10-CM

## 2020-01-10 DIAGNOSIS — M25512 Pain in left shoulder: Secondary | ICD-10-CM

## 2020-01-10 DIAGNOSIS — R600 Localized edema: Secondary | ICD-10-CM | POA: Diagnosis not present

## 2020-01-10 DIAGNOSIS — M6281 Muscle weakness (generalized): Secondary | ICD-10-CM | POA: Diagnosis not present

## 2020-01-10 NOTE — Therapy (Signed)
[facility] [place], [place], [address] Phone: [phone]   Fax:  [phone]  Physical Therapy Treatment  Patient Details  Name: [name] MRN: [id number] Date of Birth: Jun 06, 1955 Referring Provider (PT): Justice Britain, MD   Encounter Date: 01/10/2020   PT End of Session - 01/10/20 1525    Visit Number 10    Number of Visits 12    Date for PT Re-Evaluation 01/25/20    Authorization Type FOTO (7th visit: 57% limitation); Progress note every 10th visit; KX modifier after 15t visit, (13 visits used this year)    PT Start Time 1515    PT Stop Time 1600    PT Time Calculation (min) 45 min           Past Medical History:  Diagnosis Date  . ANGIOMA 10/03/2008   REMOVED FROM RIGHT LOWER LEG-BENIGN  . Anxiety   . Arthritis    RA  . Bronchitis    hx of   . Cancer (HCC)    BASAL CELL SKIN CANCER  . Complication of anesthesia   . Fibromyalgia   . GERD 08/27/2008  . History of blood transfusion    1995  . History of hiatal hernia    hx of has had surgically repaired  . History of measles   . History of mumps   . History of nonmelanoma skin cancer   . HYPERLIPIDEMIA 08/27/2008  . HYPERTENSION 08/27/2008   pt is currently on no meds   . Pneumonia    hx of   . PONV (postoperative nausea and vomiting)   . Rheumatoid arthritis ([place])   . SEBORRHEIC KERATOSIS 08/27/2008    Past Surgical History:  Procedure Laterality Date  . ABDOMINAL HYSTERECTOMY  1995  . APPENDECTOMY    . BREAST BIOPSY Left   . BREAST DUCTAL SYSTEM EXCISION  08/16/2011   Procedure: EXCISION DUCTAL SYSTEM BREAST;  Surgeon: Joyice Faster. Cornett, MD;  Location: [facility];  Service: General;  Laterality: Left;  left breast duct excision  . [place]   times 2; 1980 and 1982  . [place]  . CHOLECYSTECTOMY  1997  . EXCISION/RELEASE BURSA HIP Right 10/21/2014   Procedure: OPEN RIGHT HIP BURSECTOMY,  EXOSECTOMY;  Surgeon: Paralee Cancel, MD;  [facility];  Service: Orthopedics;  Laterality: Right;  . foot surgery  2004, 2005, 2008   bilat secondary to neuropathy   . JOINT REPLACEMENT  10,12   lt total knee X2  . KNEE ARTHROSCOPY  2007, 2008, 2009  . LAPAROSCOPIC GASTRIC SLEEVE RESECTION N/A 10/30/2013   Procedure: LAPAROSCOPIC GASTRIC SLEEVE RESECTION WITH HIATAL HERNIA REPAIR AND UPPER ENDOSCOPY;  Surgeon: Greer Pickerel, MD;  Location[facility];  Service: General;  Laterality: N/A;  . NASAL SINUS SURGERY  2013  . OPEN SURGICAL REPAIR OF GLUTEAL TENDON  01/17/2012   Procedure: OPEN SURGICAL REPAIR OF GLUTEAL TENDON;  Surgeon: Mauri Pole, MD;  [facility];  Service: Orthopedics;  Laterality: Right;    There were no vitals filed for this visit.   Subjective Assessment - 01/10/20 1526    Subjective COVID-19 screening performed upon arrival. Patient arrived with soreness in left ACJ region. States she reached into her oven with left shoulder and has been sore since.    Pertinent History Left shoulder RTC repair with SAD and DCR 11/02/2019; R shoulder RTC repair 07/09/2019; HTN, RA, Osteoporosis    Limitations Lifting;House hold activities  Patient Stated Goals decrease pain, improve movement, use arm normally                             [facility] Adult PT Treatment/Exercise - 01/10/20 0001      Shoulder Exercises: Supine   Protraction AAROM;20 reps;AROM;Left    External Rotation AAROM;Left;20 reps    Flexion AAROM;Both;15 reps      Shoulder Exercises: Pulleys   Flexion 5 minutes    Other Pulley Exercises seated ranger x 3 mins      Shoulder Exercises: ROM/Strengthening   Ranger in standing; flexion, CW and CCW 2x10 each      Modalities   Modalities Vasopneumatic      Vasopneumatic   Number Minutes Vasopneumatic  --    Vasopnuematic Location  --    Vasopneumatic Pressure --    Vasopneumatic Temperature  --      Manual Therapy   Manual Therapy Passive  ROM    Manual therapy comments rhythmic stabs for IR/ER, and flexion    Passive ROM Left shoulder PROM into flexion, ER, IR, with intermittent oscillations to decrease pain and guarding                    PT Short Term Goals - 11/21/19 1241      PT SHORT TERM GOAL #1   Title Patient will demonstrate 90+ degrees of left shoulder flexion PROM to improve ROM    Time 2    Period Weeks    Status Achieved      PT SHORT TERM GOAL #2   Title Patient will demonstrate 30+ degrees of left shoulder ER to improve ROM    Time 2    Period Weeks    Status Achieved             PT Long Term Goals - 01/03/20 1011      PT LONG TERM GOAL #1   Title Patient will be independent with HEP    Time 4    Period Weeks    Status Achieved      PT LONG TERM GOAL #2   Title Patient will demonstate 140+ degrees of left shoulder flexion AROM to improve ability to perform overhead tasks    Time 4    Period Weeks    Status On-going      PT LONG TERM GOAL #3   Title Patient will demonstrate 60+ degrees of left shoulder ER AROM to improve donning and doffing apparel.    Baseline AROM 50 degrees 01/03/20    Time 4    Period Weeks    Status On-going      PT LONG TERM GOAL #4   Title Patient will demonstrate 4/5 or greater left shoulder MMT in all planes to improve stability during functional tasks.    Time 4    Period Weeks    Status On-going      PT LONG TERM GOAL #5   Title Patient will report ability to perform ADLs and gardening activities with left shoulder pain less than or equal to 3/10.    Baseline up to 5/10 with ADLs 01/03/2020    Time 4    Period Weeks    Status On-going                 Plan - 01/10/20 1623    Clinical Impression Statement Pt arrived today doing ok with LTshldr with mainly soreness.  She did well with AAROM as well as PROM LT shldr. No modalities today as per Pt.    Comorbidities Left shoulder RTC repair with SAD and DCR 11/02/2019; R shoulder RTC  repair 07/09/2019; HTN, RA, Osteoporosis    Examination-Activity Limitations Bed Mobility    Stability/Clinical Decision Making Stable/Uncomplicated    Rehab Potential Excellent    PT Frequency 1x / week    PT Duration 4 weeks    PT Treatment/Interventions Cryotherapy;Electrical Stimulation;ADLs/Self Care Home Management;Iontophoresis 4mg /ml Dexamethasone;Moist Heat;Ultrasound;Therapeutic activities;Therapeutic exercise;Neuromuscular re-education;Manual techniques;Passive range of motion;Patient/family education;Vasopneumatic Device;Taping    PT Next Visit Plan ; cont with POC per protocol (8 weeks 12/28/19)    PT Home Exercise Plan see patient education section           Patient will benefit from skilled therapeutic intervention in order to improve the following deficits and impairments:  Decreased activity tolerance, Decreased strength, Decreased range of motion, Impaired UE functional use, Pain  Visit Diagnosis: Stiffness of left shoulder, not elsewhere classified  Acute pain of left shoulder  Muscle weakness (generalized)     Problem List Patient Active Problem List   Diagnosis Date Noted  . Pulmonary nodules 12/05/2019  . Bilateral occipital neuralgia 09/26/2017  . Status post left foot surgery 07/07/2015  . Neuroma 05/20/2015  . HAV (hallux abducto valgus) 05/20/2015  . Hammertoe 05/20/2015  . Prominent metatarsal head 05/20/2015  . Chronic foot pain 05/20/2015  . Anxiety state 03/03/2015  . Hip bursitis 10/22/2014  . Osteophyte of hip 10/21/2014  . Epigastric cramping 11/09/2013  . Nausea with vomiting 11/06/2013  . Rheumatoid arthritis ([place]) 11/01/2013  . S/P laparoscopic sleeve gastrectomy 10/30/2013  . Anemia 06/04/2013  . CAP (community acquired pneumonia) 02/17/2013  . Restless leg syndrome 09/18/2012  . Morbid obesity with BMI of 45.0-49.9, adult ([place]) 01/18/2012  . Right gluteus tear 01/17/2012  . Nipple discharge in female 07/08/2011  . Chronic insomnia  06/18/2011  . Discharge from nipple 01/06/2011  . TMJ PAIN 01/12/2010  . DEPRESSION 12/10/2009  . SINUSITIS, ACUTE 12/30/2008  . Lumbago 12/30/2008  . EDEMA 10/03/2008  . Hyperlipidemia 08/27/2008  . Essential hypertension 08/27/2008  . GERD 08/27/2008    Jomarion Mish,CHRIS, PTA 01/10/2020, 4:31 PM  [facility] [address] [place], [place], [address] Phone: [phone]   Fax:  [phone]  Name: [name] MRN: [id number] Date of Birth: 1955/09/25

## 2020-01-14 ENCOUNTER — Ambulatory Visit: Payer: Medicare Other | Admitting: *Deleted

## 2020-01-14 ENCOUNTER — Other Ambulatory Visit: Payer: Self-pay

## 2020-01-14 DIAGNOSIS — M6281 Muscle weakness (generalized): Secondary | ICD-10-CM

## 2020-01-14 DIAGNOSIS — M25612 Stiffness of left shoulder, not elsewhere classified: Secondary | ICD-10-CM | POA: Diagnosis not present

## 2020-01-14 DIAGNOSIS — R600 Localized edema: Secondary | ICD-10-CM

## 2020-01-14 DIAGNOSIS — M25512 Pain in left shoulder: Secondary | ICD-10-CM

## 2020-01-14 NOTE — Therapy (Signed)
[facility] [place], [place], [address] Phone: [phone]   Fax:  [phone]  Physical Therapy Treatment  Patient Details  Name: [name] MRN: [id number] Date of Birth: [birth year] Referring Provider (PT): Justice Britain, MD   Encounter Date: 01/14/2020   PT End of Session - 01/14/20 1601    Visit Number 11    Number of Visits 12    Date for PT Re-Evaluation 01/25/20    Authorization Type FOTO (7th visit: 57% limitation); Progress note every 10th visit; KX modifier after 15t visit, (13 visits used this year)    PT Start Time 1030    PT Stop Time 1120    PT Time Calculation (min) 50 min           Past Medical History:  Diagnosis Date  . ANGIOMA 10/03/2008   REMOVED FROM RIGHT LOWER LEG-BENIGN  . Anxiety   . Arthritis    RA  . Bronchitis    hx of   . Cancer (HCC)    BASAL CELL SKIN CANCER  . Complication of anesthesia   . Fibromyalgia   . GERD 08/27/2008  . History of blood transfusion    1995  . History of hiatal hernia    hx of has had surgically repaired  . History of measles   . History of mumps   . History of nonmelanoma skin cancer   . HYPERLIPIDEMIA 08/27/2008  . HYPERTENSION 08/27/2008   pt is currently on no meds   . Pneumonia    hx of   . PONV (postoperative nausea and vomiting)   . Rheumatoid arthritis ([place])   . SEBORRHEIC KERATOSIS 08/27/2008    Past Surgical History:  Procedure Laterality Date  . ABDOMINAL HYSTERECTOMY  1995  . APPENDECTOMY    . BREAST BIOPSY Left   . BREAST DUCTAL SYSTEM EXCISION  08/16/2011   Procedure: EXCISION DUCTAL SYSTEM BREAST;  Surgeon: Joyice Faster. Cornett, MD;  Location: [facility];  Service: General;  Laterality: Left;  left breast duct excision  . [place]   times 2; 1980 and 1982  . [place]  . CHOLECYSTECTOMY  1997  . EXCISION/RELEASE BURSA HIP Right 10/21/2014   Procedure: OPEN RIGHT HIP BURSECTOMY,  EXOSECTOMY;  Surgeon: Paralee Cancel, MD;  [facility];  Service: Orthopedics;  Laterality: Right;  . foot surgery  2004, 2005, 2008   bilat secondary to neuropathy   . JOINT REPLACEMENT  10,12   lt total knee X2  . KNEE ARTHROSCOPY  2007, 2008, 2009  . LAPAROSCOPIC GASTRIC SLEEVE RESECTION N/A 10/30/2013   Procedure: LAPAROSCOPIC GASTRIC SLEEVE RESECTION WITH HIATAL HERNIA REPAIR AND UPPER ENDOSCOPY;  Surgeon: Greer Pickerel, MD;  [facility];  Service: General;  Laterality: N/A;  . NASAL SINUS SURGERY  2013  . OPEN SURGICAL REPAIR OF GLUTEAL TENDON  01/17/2012   Procedure: OPEN SURGICAL REPAIR OF GLUTEAL TENDON;  Surgeon: Mauri Pole, MD;  [facility];  Service: Orthopedics;  Laterality: Right;    There were no vitals filed for this visit.   Subjective Assessment - 01/14/20 1042    Subjective COVID-19 screening performed upon arrival. Patient arrived with soreness in left ACJ region. States she reached into her oven with left shoulder and has been sore since.    Pertinent History Left shoulder RTC repair with SAD and DCR 11/02/2019; R shoulder RTC repair 07/09/2019; HTN, RA, Osteoporosis    Limitations Lifting;House hold activities  Patient Stated Goals decrease pain, improve movement, use arm normally    Currently in Pain? Yes    Pain Score 1     Pain Location Shoulder    Pain Orientation Left    Pain Descriptors / Indicators Sore                             [facility] Adult PT Treatment/Exercise - 01/14/20 0001      Shoulder Exercises: Sidelying   External Rotation AROM;Left   3x10   ABduction AROM;Left   2x10     Shoulder Exercises: Pulleys   Flexion 5 minutes    Other Pulley Exercises seated ranger x 5 mins      Shoulder Exercises: ROM/Strengthening   Rebounder 120 RPMS x 6 mins    Ranger in standing; flexion, CW and CCW 2x10 each      Manual Therapy   Manual Therapy Passive ROM    Manual therapy comments rhythmic stabs for IR/ER, and flexion     Passive ROM Left shoulder PROM into flexion, ER, IR, with intermittent oscillations to decrease pain and guarding                    PT Short Term Goals - 11/21/19 1241      PT SHORT TERM GOAL #1   Title Patient will demonstrate 90+ degrees of left shoulder flexion PROM to improve ROM    Time 2    Period Weeks    Status Achieved      PT SHORT TERM GOAL #2   Title Patient will demonstrate 30+ degrees of left shoulder ER to improve ROM    Time 2    Period Weeks    Status Achieved             PT Long Term Goals - 01/03/20 1011      PT LONG TERM GOAL #1   Title Patient will be independent with HEP    Time 4    Period Weeks    Status Achieved      PT LONG TERM GOAL #2   Title Patient will demonstate 140+ degrees of left shoulder flexion AROM to improve ability to perform overhead tasks    Time 4    Period Weeks    Status On-going      PT LONG TERM GOAL #3   Title Patient will demonstrate 60+ degrees of left shoulder ER AROM to improve donning and doffing apparel.    Baseline AROM 50 degrees 01/03/20    Time 4    Period Weeks    Status On-going      PT LONG TERM GOAL #4   Title Patient will demonstrate 4/5 or greater left shoulder MMT in all planes to improve stability during functional tasks.    Time 4    Period Weeks    Status On-going      PT LONG TERM GOAL #5   Title Patient will report ability to perform ADLs and gardening activities with left shoulder pain less than or equal to 3/10.    Baseline up to 5/10 with ADLs 01/03/2020    Time 4    Period Weeks    Status On-going                 Plan - 01/14/20 1046    Clinical Impression Statement Pt arrived today doing fairly well with low pain levels and was able to  complete all AAROM exs and new sidelying AROM exs as well. PROM was performed today in all motions LT shldr with smmoth arc, but firm end-feel.    Comorbidities Left shoulder RTC repair with SAD and DCR 11/02/2019; R shoulder RTC  repair 07/09/2019; HTN, RA, Osteoporosis    Examination-Activity Limitations Bed Mobility    Stability/Clinical Decision Making Stable/Uncomplicated    Rehab Potential Excellent    PT Frequency 1x / week    PT Duration 4 weeks    PT Treatment/Interventions Cryotherapy;Electrical Stimulation;ADLs/Self Care Home Management;Iontophoresis 4mg /ml Dexamethasone;Moist Heat;Ultrasound;Therapeutic activities;Therapeutic exercise;Neuromuscular re-education;Manual techniques;Passive range of motion;Patient/family education;Vasopneumatic Device;Taping    PT Next Visit Plan ; cont with POC per protocol (10 weeks 01/11/20)    PT Home Exercise Plan see patient education section    Consulted and Agree with Plan of Care Patient           Patient will benefit from skilled therapeutic intervention in order to improve the following deficits and impairments:  Decreased activity tolerance, Decreased strength, Decreased range of motion, Impaired UE functional use, Pain  Visit Diagnosis: Stiffness of left shoulder, not elsewhere classified  Muscle weakness (generalized)  Acute pain of left shoulder  Localized edema     Problem List Patient Active Problem List   Diagnosis Date Noted  . Pulmonary nodules 12/05/2019  . Bilateral occipital neuralgia 09/26/2017  . Status post left foot surgery 07/07/2015  . Neuroma 05/20/2015  . HAV (hallux abducto valgus) 05/20/2015  . Hammertoe 05/20/2015  . Prominent metatarsal head 05/20/2015  . Chronic foot pain 05/20/2015  . Anxiety state 03/03/2015  . Hip bursitis 10/22/2014  . Osteophyte of hip 10/21/2014  . Epigastric cramping 11/09/2013  . Nausea with vomiting 11/06/2013  . Rheumatoid arthritis ([place]) 11/01/2013  . S/P laparoscopic sleeve gastrectomy 10/30/2013  . Anemia 06/04/2013  . CAP (community acquired pneumonia) 02/17/2013  . Restless leg syndrome 09/18/2012  . Morbid obesity with BMI of 45.0-49.9, adult ([place]) 01/18/2012  . Right gluteus tear  01/17/2012  . Nipple discharge in female 07/08/2011  . Chronic insomnia 06/18/2011  . Discharge from nipple 01/06/2011  . TMJ PAIN 01/12/2010  . DEPRESSION 12/10/2009  . SINUSITIS, ACUTE 12/30/2008  . Lumbago 12/30/2008  . EDEMA 10/03/2008  . Hyperlipidemia 08/27/2008  . Essential hypertension 08/27/2008  . GERD 08/27/2008    Tracee Mccreery,CHRIS, PTA 01/14/2020, 4:07 PM  [facility] [address] [place], [place], [address] Phone: [phone]   Fax:  [phone]  Name: [name] MRN: [id number] Date of Birth: 26-Mar-1955

## 2020-01-15 ENCOUNTER — Encounter: Payer: Self-pay | Admitting: Cardiology

## 2020-01-15 ENCOUNTER — Ambulatory Visit: Payer: Medicare Other | Admitting: Cardiology

## 2020-01-15 ENCOUNTER — Telehealth: Payer: Self-pay | Admitting: Radiology

## 2020-01-15 VITALS — BP 120/78 | HR 77 | Ht 64.0 in | Wt 214.8 lb

## 2020-01-15 DIAGNOSIS — Z8249 Family history of ischemic heart disease and other diseases of the circulatory system: Secondary | ICD-10-CM

## 2020-01-15 DIAGNOSIS — Z01812 Encounter for preprocedural laboratory examination: Secondary | ICD-10-CM

## 2020-01-15 DIAGNOSIS — R0789 Other chest pain: Secondary | ICD-10-CM

## 2020-01-15 DIAGNOSIS — R55 Syncope and collapse: Secondary | ICD-10-CM

## 2020-01-15 DIAGNOSIS — E7800 Pure hypercholesterolemia, unspecified: Secondary | ICD-10-CM | POA: Diagnosis not present

## 2020-01-15 DIAGNOSIS — R072 Precordial pain: Secondary | ICD-10-CM | POA: Diagnosis not present

## 2020-01-15 MED ORDER — METOPROLOL TARTRATE 100 MG PO TABS: 100.0000 mg | ORAL_TABLET | Freq: Once | ORAL | 0 refills | Status: DC

## 2020-01-15 NOTE — Patient Instructions (Addendum)
Medication Instructions:  Please discontinue your Lisinopril.  Continue all other medications as listed.  *If you need a refill on your cardiac medications before your next appointment, please call your pharmacy*  Testing/Procedures: Your physician has requested that you have an echocardiogram. Echocardiography is a painless test that uses sound waves to create images of your heart. It provides your doctor with information about the size and shape of your heart and how well your heart's chambers and valves are working. This procedure takes approximately one hour. There are no restrictions for this procedure.  ZIO XT- Long Term Monitor Instructions   Your physician has requested you wear your ZIO patch monitor 14 days.   This is a single patch monitor.  [employer] supplies one patch monitor per enrollment.  Additional stickers are not available.   Please do not apply patch if you will be having a Nuclear Stress Test, Echocardiogram, Cardiac CT, MRI, or Chest Xray during the time frame you would be wearing the monitor. The patch cannot be worn during these tests.  You cannot remove and re-apply the ZIO XT patch monitor.   Your ZIO patch monitor will be sent USPS Priority mail from [facility] directly to your home address. The monitor may also be mailed to a PO BOX if home delivery is not available.   It may take 3-5 days to receive your monitor after you have been enrolled.   Once you have received you monitor, please review enclosed instructions.  Your monitor has already been registered assigning a specific monitor serial # to you.   Applying the monitor   Shave hair from upper left chest.   Hold abrader disc by orange tab.  Rub abrader in 40 strokes over left upper chest as indicated in your monitor instructions.   Clean area with 4 enclosed alcohol pads .  Use all pads to assure are is cleaned thoroughly.  Let dry.   Apply patch as indicated in monitor instructions.  Patch will  be place under collarbone on left side of chest with arrow pointing upward.   Rub patch adhesive wings for 2 minutes.Remove white label marked "1".  Remove white label marked "2".  Rub patch adhesive wings for 2 additional minutes.   While looking in a mirror, press and release button in center of patch.  A small green light will flash 3-4 times .  This will be your only indicator the monitor has been turned on.     Do not shower for the first 24 hours.  You may shower after the first 24 hours.   Press button if you feel a symptom. You will hear a small click.  Record Date, Time and Symptom in the Patient Log Book.   When you are ready to remove patch, follow instructions on last 2 pages of Patient Log Book.  Stick patch monitor onto last page of Patient Log Book.   Place Patient Log Book in [place] box.  Use locking tab on box and tape box closed securely.  The [employer] has [employer] on it.  Please place in mailbox as soon as possible.  Your physician should have your test results approximately 7 days after the monitor has been mailed back to [facility].   Call [place] at [phone] if you have questions regarding your ZIO XT patch monitor.  Call them immediately if you see an orange light blinking on your monitor.   If your monitor falls off in less  than 4 days contact our Monitor department at [phone].  If your monitor becomes loose or falls off after 4 days call [employer] at [phone] for suggestions on securing your monitor.   Your cardiac CT will be scheduled at:   [facility] [address] [place], [place] [address] [phone]  Please arrive at the [facility] main entrance of [facility] 30 minutes prior to test start time. Proceed to the [facility] Radiology Department (first floor) to check-in and test prep.  Please follow these instructions carefully (unless otherwise directed):  On the Night Before  the Test: . Be sure to Drink plenty of water. . Do not consume any caffeinated/decaffeinated beverages or chocolate 12 hours prior to your test. . Do not take any antihistamines 12 hours prior to your test.  On the Day of the Test: . Drink plenty of water. Do not drink any water within one hour of the test. . Do not eat any food 4 hours prior to the test. . You may take your regular medications prior to the test.  . Take metoprolol (Lopressor) two hours prior to test. . HOLD Furosemide/Hydrochlorothiazide morning of the test. . FEMALES- please wear underwire-free bra if available     After the Test: . Drink plenty of water. . After receiving IV contrast, you may experience a mild flushed feeling. This is normal. . On occasion, you may experience a mild rash up to 24 hours after the test. This is not dangerous. If this occurs, you can take Benadryl 25 mg and increase your fluid intake. . If you experience trouble breathing, this can be serious. If it is severe call 911 IMMEDIATELY. If it is mild, please call our office.   Once we have confirmed authorization from your insurance company, we will call you to set up a date and time for your test. Based on how quickly your insurance processes prior authorizations requests, please allow up to 4 weeks to be contacted for scheduling your Cardiac CT appointment. Be advised that routine Cardiac CT appointments could be scheduled as many as 8 weeks after your provider has ordered it.  For non-scheduling related questions, please contact the cardiac imaging nurse navigator should you have any questions/concerns: Marchia Bond, Cardiac Imaging Nurse Navigator Burley Saver, Interim Cardiac Imaging Nurse [place] and Vascular Services Direct Office Dial: [phone]   For scheduling needs, including cancellations and rescheduling, please call [place], [phone] (temporary number).   Follow-Up: At [facility], you and your health  needs are our priority.  As part of our continuing mission to provide you with exceptional heart care, we have created designated Provider Care Teams.  These Care Teams include your primary Cardiologist (physician) and Advanced Practice Providers (APPs -  Physician Assistants and Nurse Practitioners) who all work together to provide you with the care you need, when you need it.  We recommend signing up for the patient portal called "MyChart".  Sign up information is provided on this After Visit Summary.  MyChart is used to connect with patients for Virtual Visits (Telemedicine).  Patients are able to view lab/test results, encounter notes, upcoming appointments, etc.  Non-urgent messages can be sent to your provider as well.   To learn more about what you can do with MyChart, go to [email or web address].    Your next appointment:   6-8 week(s)  The format for your next appointment:   In Person  Provider:   Candee Furbish, MD   Thank  you for choosing [facility]!!

## 2020-01-15 NOTE — Telephone Encounter (Signed)
Enrolled patient for a 14 day Zio XT  monitor to be mailed to patients home  °

## 2020-01-15 NOTE — Progress Notes (Signed)
Cardiology Office Note:    Date:  01/15/2020   ID:  [name], DOB 07-16-1955, MRN [id number]  PCP:  Eulas Post, MD  [facility] Cardiologist:  No primary care provider on file.  [facility] Electrophysiologist:  None   Referring MD: Eulas Post, MD     History of Present Illness:    [name] is a 64 y.o. female here for the evaluation of atypical chest pain at the request of Dr. Elease Hashimoto.  In review of his note from 12/24/2019, she had a few days of "feeling bad "intermittent nausea few episodes of vomiting.  No abdominal pain.  She seemed to be more fatigued than usual.  She drove up to [place] and she had a sensation of feeling very hot all over with waves of nausea and potentially a brief loss of consciousness.  May be a brief syncopal episode.  She has had appendectomy and cholecystectomy and hysterectomy as well as gastric sleeve.  Hyperlipidemia non-smoker nondiabetic  Brother, 3 sisters and MI, one died.  All before 50. Son MI.   Past Medical History:  Diagnosis Date  . ANGIOMA 10/03/2008   REMOVED FROM RIGHT LOWER LEG-BENIGN  . Anxiety   . Arthritis    RA  . Bronchitis    hx of   . Cancer (HCC)    BASAL CELL SKIN CANCER  . Complication of anesthesia   . Fibromyalgia   . GERD 08/27/2008  . History of blood transfusion    1995  . History of hiatal hernia    hx of has had surgically repaired  . History of measles   . History of mumps   . History of nonmelanoma skin cancer   . HYPERLIPIDEMIA 08/27/2008  . HYPERTENSION 08/27/2008   pt is currently on no meds   . Pneumonia    hx of   . PONV (postoperative nausea and vomiting)   . Rheumatoid arthritis ([place])   . SEBORRHEIC KERATOSIS 08/27/2008    Past Surgical History:  Procedure Laterality Date  . ABDOMINAL HYSTERECTOMY  1995  . APPENDECTOMY    . BREAST BIOPSY Left   . BREAST DUCTAL SYSTEM EXCISION  08/16/2011   Procedure: EXCISION DUCTAL SYSTEM BREAST;  Surgeon: Joyice Faster. Cornett, MD;  Location: [place];  Service: General;  Laterality: Left;  left breast duct excision  . [place]   times 2; 1980 and 1982  . [place]  . CHOLECYSTECTOMY  1997  . EXCISION/RELEASE BURSA HIP Right 10/21/2014   Procedure: OPEN RIGHT HIP BURSECTOMY, EXOSECTOMY;  Surgeon: Paralee Cancel, MD;  Location: [facility];  Service: Orthopedics;  Laterality: Right;  . foot surgery  2004, 2005, 2008   bilat secondary to neuropathy   . JOINT REPLACEMENT  10,12   lt total knee X2  . KNEE ARTHROSCOPY  2007, 2008, 2009  . LAPAROSCOPIC GASTRIC SLEEVE RESECTION N/A 10/30/2013   Procedure: LAPAROSCOPIC GASTRIC SLEEVE RESECTION WITH HIATAL HERNIA REPAIR AND UPPER ENDOSCOPY;  Surgeon: Greer Pickerel, MD;  Location: [facility];  Service: General;  Laterality: N/A;  . NASAL SINUS SURGERY  2013  . OPEN SURGICAL REPAIR OF GLUTEAL TENDON  01/17/2012   Procedure: OPEN SURGICAL REPAIR OF GLUTEAL TENDON;  Surgeon: Mauri Pole, MD;  Location: [facility];  Service: Orthopedics;  Laterality: Right;    Current Medications: Current Meds  Medication Sig  . acetaminophen (TYLENOL) 500 MG tablet Take 1,000 mg by mouth every 6 (six)  hours as needed for pain.   . Biotin 1000 MCG tablet Take 3,000 mcg by mouth daily.  Marland Kitchen EPINEPHrine (EPIPEN 2-PAK) 0.3 mg/0.3 mL IJ SOAJ injection Inject 0.3 mLs (0.3 mg total) into the muscle once.  Marland Kitchen estradiol (ESTRACE) 0.5 MG tablet Take 1 tablet (0.5 mg total) by mouth daily.  Marland Kitchen etanercept (ENBREL) 50 MG/ML injection Inject 0.98 mLs (50 mg total) into the skin once a week. Resume end of next week  . famotidine (PEPCID) 40 MG tablet Take 40 mg by mouth daily.  . folic acid (FOLVITE) 1 MG tablet Take 2 mg by mouth daily.   . hydroxychloroquine (PLAQUENIL) 200 MG tablet TAKE 1 TABLET BY MOUTH ONCE DAILY WITH FOOD OR MILK FOR 90 DAYS  . methotrexate (50 MG/ML) 1 gm SOLR May resume previous dose at  end of next week  . oxyCODONE-acetaminophen  (PERCOCET/ROXICET) 5-325 MG tablet TAKE 1 TABLET AS NEEDED EVERY 8 HOURS FOR PAIN  . pantoprazole (PROTONIX) 40 MG tablet Take 40 mg by mouth daily.  . predniSONE (DELTASONE) 5 MG tablet Take 5 mg by mouth daily with breakfast.  . promethazine (PHENERGAN) 25 MG tablet Take 1 tablet (25 mg total) by mouth every 8 (eight) hours as needed for nausea or vomiting.  Marland Kitchen scopolamine (TRANSDERM-SCOP) 1 MG/3DAYS Place 1 patch (1.5 mg total) onto the skin every 3 (three) days. Behind ear  . sertraline (ZOLOFT) 50 MG tablet TAKE 1 & 1/2 (ONE & ONE-HALF) TABLETS BY MOUTH ONCE DAILY  . zolpidem (AMBIEN) 10 MG tablet TAKE 1 TABLET BY MOUTH AT BEDTIME AS NEEDED FOR SLEEP  . [DISCONTINUED] lisinopril (ZESTRIL) 10 MG tablet Take 1 tablet by mouth once daily     Allergies:   Bee venom, Codeine sulfate, Keflex [cephalexin], and Penicillins   Social History   Socioeconomic History  . Marital status: Married    Spouse name: Not on file  . Number of children: 2  . Years of education: Not on file  . Highest education level: High school graduate  Occupational History  . Occupation: retired    Comment: Building services engineer  Tobacco Use  . Smoking status: Former Smoker    Packs/day: 0.25    Years: 4.00    Pack years: 1.00    Types: Cigarettes    Quit date: 02/23/1984    Years since quitting: 35.9  . Smokeless tobacco: Never Used  Vaping Use  . Vaping Use: Never used  Substance and Sexual Activity  . Alcohol use: No  . Drug use: No  . Sexual activity: Not on file  Other Topics Concern  . Not on file  Social History Narrative   Lives at home with husband   Disabled   Right handed   Caffeine: 1 cup of coffee daily   Social Determinants of Health   Financial Resource Strain:   . Difficulty of Paying Living Expenses: Not on file  Food Insecurity:   . Worried About Charity fundraiser in the Last Year: Not on file  . Ran Out of Food in the Last Year: Not on file  Transportation Needs:   . Lack of  Transportation (Medical): Not on file  . Lack of Transportation (Non-Medical): Not on file  Physical Activity:   . Days of Exercise per Week: Not on file  . Minutes of Exercise per Session: Not on file  Stress:   . Feeling of Stress : Not on file  Social Connections:   . Frequency of Communication with Friends  and Family: Not on file  . Frequency of Social Gatherings with Friends and Family: Not on file  . Attends Religious Services: Not on file  . Active Member of Clubs or Organizations: Not on file  . Attends Archivist Meetings: Not on file  . Marital Status: Not on file     Family History: The patient's family history includes Breast cancer (age of onset: 18) in her mother; Cancer in her mother; Diabetes in her son; Epilepsy in her father; Heart attack in her sister and sister; Kidney disease in her father.  ROS:   Please see the history of present illness.    No fevers no chills no bleeding no orthopnea no significant shortness of breath all other systems reviewed and are negative.  EKGs/Labs/Other Studies Reviewed:    The following studies were reviewed today: CT scan of chest in 2015-LAD calcified plaque noted.  Personally reviewed and interpreted.  Echocardiogram 2008: - Overall left ventricular systolic function was normal. Left     ventricular ejection fraction was estimated to be 60 %. There     was no diagnostic evidence of left ventricular regional wall     motion abnormalities.  - There are probably three cusps of the aortic valve, but I can not     see the cusps well.  - Normal mitral valve  - Left atrial size was at the upper limits of normal.    EKG:  normal sinus rhythm 71 with no other abnormalities.  Recent Labs: 12/05/2019: ALT 11; TSH 2.30 12/24/2019: BUN 15; Creat 0.55; Hemoglobin 13.6; Platelets 325; Potassium 4.0; Sodium 141  Recent Lipid Panel    Component Value Date/Time   CHOL 220 (H) 12/05/2019 1021   TRIG  124 12/05/2019 1021   HDL 84 12/05/2019 1021   CHOLHDL 2.6 12/05/2019 1021   VLDL 21.2 05/16/2017 1540   LDLCALC 113 (H) 12/05/2019 1021     Risk Assessment/Calculations:       Physical Exam:    VS:  BP 120/78   Pulse 77   Ht $R'5\' 4"'Dt$  (1.626 m)   Wt 214 lb 12.8 oz (97.4 kg)   SpO2 99%   BMI 36.87 kg/m     Wt Readings from Last 3 Encounters:  01/15/20 214 lb 12.8 oz (97.4 kg)  12/24/19 210 lb 9.6 oz (95.5 kg)  12/05/19 214 lb 9.6 oz (97.3 kg)     GEN:  Well nourished, well developed in no acute distress HEENT: Normal NECK: No JVD; No carotid bruits LYMPHATICS: No lymphadenopathy CARDIAC: RRR, no murmurs, rubs, gallops RESPIRATORY:  Clear to auscultation without rales, wheezing or rhonchi  ABDOMEN: Soft, non-tender, non-distended MUSCULOSKELETAL:  No edema; No deformity  SKIN: Warm and dry NEUROLOGIC:  Alert and oriented x 3 PSYCHIATRIC:  Normal affect   ASSESSMENT:    1. Atypical chest pain   2. Pure hypercholesterolemia   3. Vasovagal near syncope   4. Family history of early CAD   4. Precordial pain   6. Pre-procedure lab exam    PLAN:    In order of problems listed above:  Intermittent possible vasovagal syncope nausea with intermittent nonexertional atypical chest pain -Strong family history of CAD, even her son has CAD with stents.  One of her sisters died with MI.  2 of her other sisters has CAD. -LDL 113 -We will go ahead and check a coronary CT scan with possible FFR analysis given her constellation of symptoms. -Check a Zio patch monitor for 2  weeks to ensure that she does not have any adverse or dangerous arrhythmias.  Her EKG currently does not demonstrate any. -We will check an echocardiogram to ensure proper structure and function of her heart. -We will stop her lisinopril 10 mg in case blood pressures are getting too low. -Maintain hydration. -Other possibilities for the chest discomfort at night which is waking her up from sleep include GERD.   She does have a gastric sleeve in place.  She is taking PPI and Pepcid.  Coronary atherosclerosis -LAD calcification seen on CT scan from 2015 personally reviewed. -We will reclarify this on coronary CT scan.  Suggestion would likely be statin therapy.   Medication Adjustments/Labs and Tests Ordered: Current medicines are reviewed at length with the patient today.  Concerns regarding medicines are outlined above.  Orders Placed This Encounter  Procedures  . CT CORONARY MORPH W/CTA COR W/SCORE W/CA W/CM &/OR WO/CM  . CT CORONARY FRACTIONAL FLOW RESERVE DATA PREP  . CT CORONARY FRACTIONAL FLOW RESERVE FLUID ANALYSIS  . Basic metabolic panel  . LONG TERM MONITOR (3-14 DAYS)  . ECHOCARDIOGRAM COMPLETE   Meds ordered this encounter  Medications  . metoprolol tartrate (LOPRESSOR) 100 MG tablet    Sig: Take 1 tablet (100 mg total) by mouth once for 1 dose. Take 1 tablet 2 hours before your CT scan    Dispense:  1 tablet    Refill:  0    Patient Instructions  Medication Instructions:  Please discontinue your Lisinopril.  Continue all other medications as listed.  *If you need a refill on your cardiac medications before your next appointment, please call your pharmacy*  Testing/Procedures: Your physician has requested that you have an echocardiogram. Echocardiography is a painless test that uses sound waves to create images of your heart. It provides your doctor with information about the size and shape of your heart and how well your heart's chambers and valves are working. This procedure takes approximately one hour. There are no restrictions for this procedure.  ZIO XT- Long Term Monitor Instructions   Your physician has requested you wear your ZIO patch monitor 14 days.   This is a single patch monitor.  [employer] supplies one patch monitor per enrollment.  Additional stickers are not available.   Please do not apply patch if you will be having a Nuclear Stress Test, Echocardiogram,  Cardiac CT, MRI, or Chest Xray during the time frame you would be wearing the monitor. The patch cannot be worn during these tests.  You cannot remove and re-apply the ZIO XT patch monitor.   Your ZIO patch monitor will be sent USPS Priority mail from [facility] directly to your home address. The monitor may also be mailed to a PO BOX if home delivery is not available.   It may take 3-5 days to receive your monitor after you have been enrolled.   Once you have received you monitor, please review enclosed instructions.  Your monitor has already been registered assigning a specific monitor serial # to you.   Applying the monitor   Shave hair from upper left chest.   Hold abrader disc by orange tab.  Rub abrader in 40 strokes over left upper chest as indicated in your monitor instructions.   Clean area with 4 enclosed alcohol pads .  Use all pads to assure are is cleaned thoroughly.  Let dry.   Apply patch as indicated in monitor instructions.  Patch will be place under collarbone on left side  of chest with arrow pointing upward.   Rub patch adhesive wings for 2 minutes.Remove white label marked "1".  Remove white label marked "2".  Rub patch adhesive wings for 2 additional minutes.   While looking in a mirror, press and release button in center of patch.  A small green light will flash 3-4 times .  This will be your only indicator the monitor has been turned on.     Do not shower for the first 24 hours.  You may shower after the first 24 hours.   Press button if you feel a symptom. You will hear a small click.  Record Date, Time and Symptom in the Patient Log Book.   When you are ready to remove patch, follow instructions on last 2 pages of Patient Log Book.  Stick patch monitor onto last page of Patient Log Book.   Place Patient Log Book in [place] box.  Use locking tab on box and tape box closed securely.  The [employer] has [employer] on it.  Please place in mailbox  as soon as possible.  Your physician should have your test results approximately 7 days after the monitor has been mailed back to [facility] [place] [facility].   Call [place] at [phone] if you have questions regarding your ZIO XT patch monitor.  Call them immediately if you see an orange light blinking on your monitor.   If your monitor falls off in less than 4 days contact our Monitor department at [phone].  If your monitor becomes loose or falls off after 4 days call [employer] at [phone] for suggestions on securing your monitor.   Your cardiac CT will be scheduled at:   [facility] [address] [place], [place] [address] [phone]  Please arrive at the [facility] main entrance of [facility] 30 minutes prior to test start time. Proceed to the [facility] Radiology Department (first floor) to check-in and test prep.  Please follow these instructions carefully (unless otherwise directed):  On the Night Before the Test: . Be sure to Drink plenty of water. . Do not consume any caffeinated/decaffeinated beverages or chocolate 12 hours prior to your test. . Do not take any antihistamines 12 hours prior to your test.  On the Day of the Test: . Drink plenty of water. Do not drink any water within one hour of the test. . Do not eat any food 4 hours prior to the test. . You may take your regular medications prior to the test.  . Take metoprolol (Lopressor) two hours prior to test. . HOLD Furosemide/Hydrochlorothiazide morning of the test. . FEMALES- please wear underwire-free bra if available     After the Test: . Drink plenty of water. . After receiving IV contrast, you may experience a mild flushed feeling. This is normal. . On occasion, you may experience a mild rash up to 24 hours after the test. This is not dangerous. If this occurs, you can take Benadryl 25 mg and increase your fluid intake. . If you experience trouble breathing, this  can be serious. If it is severe call 911 IMMEDIATELY. If it is mild, please call our office.   Once we have confirmed authorization from your insurance company, we will call you to set up a date and time for your test. Based on how quickly your insurance processes prior authorizations requests, please allow up to 4 weeks to be contacted for scheduling your Cardiac CT appointment. Be  advised that routine Cardiac CT appointments could be scheduled as many as 8 weeks after your provider has ordered it.  For non-scheduling related questions, please contact the cardiac imaging nurse navigator should you have any questions/concerns: Marchia Bond, Cardiac Imaging Nurse Navigator Burley Saver, Interim Cardiac Imaging Nurse [place] [facility] Direct Office Dial: [phone]   For scheduling needs, including cancellations and rescheduling, please call [place], [phone] (temporary number).   Follow-Up: At [facility], you and your health needs are our priority.  As part of our continuing mission to provide you with exceptional heart care, we have created designated Provider Care Teams.  These Care Teams include your primary Cardiologist (physician) and Advanced Practice Providers (APPs -  Physician Assistants and Nurse Practitioners) who all work together to provide you with the care you need, when you need it.  We recommend signing up for the patient portal called "MyChart".  Sign up information is provided on this After Visit Summary.  MyChart is used to connect with patients for Virtual Visits (Telemedicine).  Patients are able to view lab/test results, encounter notes, upcoming appointments, etc.  Non-urgent messages can be sent to your provider as well.   To learn more about what you can do with MyChart, go to [email or web address].    Your next appointment:   6-8 week(s)  The format for your next appointment:   In Person  Provider:   Candee Furbish,  MD   Thank you for choosing [facility]!!        Signed, Candee Furbish, MD  01/15/2020 3:10 PM    [place]

## 2020-01-16 ENCOUNTER — Telehealth: Payer: Self-pay | Admitting: *Deleted

## 2020-01-16 NOTE — Telephone Encounter (Signed)
Ladoris Gene, RN; Lorenza Evangelist, RN; Zeb Comfort, RN Scheduled 12/6 @ 3:15    Pt will need labs done for this ct scan   Thanks,  [place]    Pt has been scheduled for 01/23/2020 and aware she can come anytime between 7:30 am and 4:30 pm

## 2020-01-22 ENCOUNTER — Encounter: Payer: Medicare Other | Admitting: Physical Therapy

## 2020-01-23 ENCOUNTER — Other Ambulatory Visit: Payer: Medicare Other

## 2020-01-23 ENCOUNTER — Other Ambulatory Visit: Payer: Self-pay

## 2020-01-23 DIAGNOSIS — R0789 Other chest pain: Secondary | ICD-10-CM | POA: Diagnosis not present

## 2020-01-23 DIAGNOSIS — Z01812 Encounter for preprocedural laboratory examination: Secondary | ICD-10-CM

## 2020-01-24 ENCOUNTER — Other Ambulatory Visit: Payer: Self-pay

## 2020-01-24 ENCOUNTER — Encounter: Payer: Self-pay | Admitting: Physical Therapy

## 2020-01-24 ENCOUNTER — Ambulatory Visit: Payer: Medicare Other | Attending: Orthopedic Surgery | Admitting: Physical Therapy

## 2020-01-24 DIAGNOSIS — M6281 Muscle weakness (generalized): Secondary | ICD-10-CM | POA: Insufficient documentation

## 2020-01-24 DIAGNOSIS — M25612 Stiffness of left shoulder, not elsewhere classified: Secondary | ICD-10-CM

## 2020-01-24 DIAGNOSIS — R600 Localized edema: Secondary | ICD-10-CM | POA: Insufficient documentation

## 2020-01-24 DIAGNOSIS — M25512 Pain in left shoulder: Secondary | ICD-10-CM | POA: Insufficient documentation

## 2020-01-24 LAB — BASIC METABOLIC PANEL
BUN/Creatinine Ratio: 24 (ref 12–28)
BUN: 13 mg/dL (ref 8–27)
CO2: 24 mmol/L (ref 20–29)
Calcium: 9.2 mg/dL (ref 8.7–10.3)
Chloride: 105 mmol/L (ref 96–106)
Creatinine, Ser: 0.54 mg/dL — ABNORMAL LOW (ref 0.57–1.00)
GFR calc Af Amer: 116 mL/min/{1.73_m2} (ref >59)
GFR calc non Af Amer: 101 mL/min/{1.73_m2} (ref >59)
Glucose: 98 mg/dL (ref 65–99)
Potassium: 4.2 mmol/L (ref 3.5–5.2)
Sodium: 141 mmol/L (ref 134–144)

## 2020-01-24 NOTE — Therapy (Addendum)
[facility] [place], [place], [address] Phone: [phone]   Fax:  [phone]  Physical Therapy Treatment PHYSICAL THERAPY DISCHARGE SUMMARY  Visits from Start of Care: 12  Current functional level related to goals / functional outcomes: See below   Remaining deficits: See goals   Education / Equipment: HEP Plan: Patient agrees to discharge.  Patient goals were partially met. Patient is being discharged due to not returning since the last visit.  ?????     Patient Details   Name: [name] MRN: [id number] Date of Birth: 10-06-1955 Referring Provider (PT): Justice Britain, MD   Encounter Date: 01/24/2020   PT End of Session - 01/24/20 1037    Visit Number 12    Number of Visits 18    Date for PT Re-Evaluation 02/22/20    Authorization Type FOTO (7th visit: 57% limitation); Progress note every 10th visit; KX modifier after 15t visit, (13 visits used this year)    PT Start Time 1032    PT Stop Time 1118    PT Time Calculation (min) 46 min    Activity Tolerance Patient tolerated treatment well    Behavior During Therapy WFL for tasks assessed/performed           Past Medical History:  Diagnosis Date  . ANGIOMA 10/03/2008   REMOVED FROM RIGHT LOWER LEG-BENIGN  . Anxiety   . Arthritis    RA  . Bronchitis    hx of   . Cancer (HCC)    BASAL CELL SKIN CANCER  . Complication of anesthesia   . Fibromyalgia   . GERD 08/27/2008  . History of blood transfusion    1995  . History of hiatal hernia    hx of has had surgically repaired  . History of measles   . History of mumps   . History of nonmelanoma skin cancer   . HYPERLIPIDEMIA 08/27/2008  . HYPERTENSION 08/27/2008   pt is currently on no meds   . Pneumonia    hx of   . PONV (postoperative nausea and vomiting)   . Rheumatoid arthritis ([place])   . SEBORRHEIC KERATOSIS 08/27/2008    Past Surgical History:  Procedure Laterality Date  . ABDOMINAL  HYSTERECTOMY  1995  . APPENDECTOMY    . BREAST BIOPSY Left   . BREAST DUCTAL SYSTEM EXCISION  08/16/2011   Procedure: EXCISION DUCTAL SYSTEM BREAST;  Surgeon: Joyice Faster. Cornett, MD;  Location: [facility];  Service: General;  Laterality: Left;  left breast duct excision  . [place]   times 2; 1980 and 1982  . [place]  . CHOLECYSTECTOMY  1997  . EXCISION/RELEASE BURSA HIP Right 10/21/2014   Procedure: OPEN RIGHT HIP BURSECTOMY, EXOSECTOMY;  Surgeon: Paralee Cancel, MD;  Location: [facility];  Service: Orthopedics;  Laterality: Right;  . foot surgery  2004, 2005, 2008   bilat secondary to neuropathy   . JOINT REPLACEMENT  10,12   lt total knee X2  . KNEE ARTHROSCOPY  2007, 2008, 2009  . LAPAROSCOPIC GASTRIC SLEEVE RESECTION N/A 10/30/2013   Procedure: LAPAROSCOPIC GASTRIC SLEEVE RESECTION WITH HIATAL HERNIA REPAIR AND UPPER ENDOSCOPY;  Surgeon: Greer Pickerel, MD;  Location: [facility];  Service: General;  Laterality: N/A;  . NASAL SINUS SURGERY  2013  . OPEN SURGICAL REPAIR OF GLUTEAL TENDON  01/17/2012   Procedure: OPEN SURGICAL REPAIR OF GLUTEAL TENDON;  Surgeon: Mauri Pole, MD;  Location: [facility];  Service: Orthopedics;  Laterality: Right;    There were no vitals filed for this visit.   Subjective Assessment - 01/24/20 1035    Subjective COVID-19 screening performed upon arrival. No complaints regarding shoulder after recent stressful time with husband's health.    Pertinent History Left shoulder RTC repair with SAD and DCR 11/02/2019; R shoulder RTC repair 07/09/2019; HTN, RA, Osteoporosis    Limitations Lifting;House hold activities    Patient Stated Goals decrease pain, improve movement, use arm normally    Currently in Pain? No/denies              [facility] PT Assessment - 01/24/20 0001      Assessment   Medical Diagnosis Impingement syndrome of left shoulder region    Referring Provider (PT) Justice Britain, MD    Onset Date/Surgical Date 11/02/19     Hand Dominance Right    Prior Therapy for right shoulder      Precautions   Precautions Shoulder                         [facility] Adult PT Treatment/Exercise - 01/24/20 0001      Shoulder Exercises: Supine   Flexion AROM;Left;15 reps    Diagonals AROM;Left;20 reps    Diagonals Limitations D2      Shoulder Exercises: Prone   Retraction Strengthening;Left;20 reps;Weights    Retraction Weight (lbs) 2    Extension Strengthening;Left;20 reps;Weights    Extension Weight (lbs) 2      Shoulder Exercises: Sidelying   External Rotation AROM;Left;20 reps    Flexion AROM;Left;10 reps    Flexion Limitations stopped due to pain    ABduction AROM;Left;20 reps      Shoulder Exercises: Pulleys   Flexion 5 minutes      Shoulder Exercises: ROM/Strengthening   UBE (Upper Arm Bike) 90 RPM x8 min    Wall Wash into flex with ER x15 reps      Modalities   Modalities Electrical Stimulation;Moist Heat      Moist Heat Therapy   Number Minutes Moist Heat 15 Minutes    Moist Heat Location Shoulder      Electrical Stimulation   Electrical Stimulation Location Left shoulder    Electrical Stimulation Action Pre-Mod    Electrical Stimulation Parameters 80-150 hz x15 min    Electrical Stimulation Goals Pain                    PT Short Term Goals - 11/21/19 1241      PT SHORT TERM GOAL #1   Title Patient will demonstrate 90+ degrees of left shoulder flexion PROM to improve ROM    Time 2    Period Weeks    Status Achieved      PT SHORT TERM GOAL #2   Title Patient will demonstrate 30+ degrees of left shoulder ER to improve ROM    Time 2    Period Weeks    Status Achieved             PT Long Term Goals - 01/03/20 1011      PT LONG TERM GOAL #1   Title Patient will be independent with HEP    Time 4    Period Weeks    Status Achieved      PT LONG TERM GOAL #2   Title Patient will demonstate 140+ degrees of left shoulder flexion AROM to improve ability to  perform overhead tasks  Time 4    Period Weeks    Status On-going      PT LONG TERM GOAL #3   Title Patient will demonstrate 60+ degrees of left shoulder ER AROM to improve donning and doffing apparel.    Baseline AROM 50 degrees 01/03/20    Time 4    Period Weeks    Status On-going      PT LONG TERM GOAL #4   Title Patient will demonstrate 4/5 or greater left shoulder MMT in all planes to improve stability during functional tasks.    Time 4    Period Weeks    Status On-going      PT LONG TERM GOAL #5   Title Patient will report ability to perform ADLs and gardening activities with left shoulder pain less than or equal to 3/10.    Baseline up to 5/10 with ADLs 01/03/2020    Time 4    Period Weeks    Status On-going                 Plan - 01/24/20 1123    Clinical Impression Statement Patient presented in clinic with reports of a lot of stress recently from husband's heart issues. Patient able to tolerate therex fairly well although [facility] joint pain reoccuring intermittantly especially with flexion. Patient required intermittant multimodal cueing to instruct for exercise technique. Normal modalities response noted following removal of the modalities.    Personal Factors and Comorbidities Comorbidity 3+    Comorbidities Left shoulder RTC repair with SAD and DCR 11/02/2019; R shoulder RTC repair 07/09/2019; HTN, RA, Osteoporosis    Examination-Activity Limitations Bed Mobility    Stability/Clinical Decision Making Stable/Uncomplicated    Rehab Potential Excellent    PT Frequency 1x / week    PT Duration 4 weeks    PT Treatment/Interventions Cryotherapy;Electrical Stimulation;ADLs/Self Care Home Management;Iontophoresis 31m/ml Dexamethasone;Moist Heat;Ultrasound;Therapeutic activities;Therapeutic exercise;Neuromuscular re-education;Manual techniques;Passive range of motion;Patient/family education;Vasopneumatic Device;Taping    PT Next Visit Plan ; cont with POC per protocol (12  weeks 01/25/20)    PT Home Exercise Plan see patient education section    Consulted and Agree with Plan of Care Patient           Patient will benefit from skilled therapeutic intervention in order to improve the following deficits and impairments:  Decreased activity tolerance, Decreased strength, Decreased range of motion, Impaired UE functional use, Pain  Visit Diagnosis: Stiffness of left shoulder, not elsewhere classified  Muscle weakness (generalized)  Acute pain of left shoulder  Localized edema     Problem List Patient Active Problem List   Diagnosis Date Noted  . Pulmonary nodules 12/05/2019  . Bilateral occipital neuralgia 09/26/2017  . Status post left foot surgery 07/07/2015  . Neuroma 05/20/2015  . HAV (hallux abducto valgus) 05/20/2015  . Hammertoe 05/20/2015  . Prominent metatarsal head 05/20/2015  . Chronic foot pain 05/20/2015  . Anxiety state 03/03/2015  . Hip bursitis 10/22/2014  . Osteophyte of hip 10/21/2014  . Epigastric cramping 11/09/2013  . Nausea with vomiting 11/06/2013  . Rheumatoid arthritis [place] 11/01/2013  . S/P laparoscopic sleeve gastrectomy 10/30/2013  . Anemia 06/04/2013  . CAP (community acquired pneumonia) 02/17/2013  . Restless leg syndrome 09/18/2012  . Morbid obesity with BMI of 45.0-49.9, adult ([place] 01/18/2012  . Right gluteus tear 01/17/2012  . Nipple discharge in female 07/08/2011  . Chronic insomnia 06/18/2011  . Discharge from nipple 01/06/2011  . TMJ PAIN 01/12/2010  . DEPRESSION 12/10/2009  . SINUSITIS,  ACUTE 12/30/2008  . Lumbago 12/30/2008  . EDEMA 10/03/2008  . Hyperlipidemia 08/27/2008  . Essential hypertension 08/27/2008  . GERD 08/27/2008    Standley Brooking, PTA 01/24/2020, 12:52 PM  [facility] [address] [place], [place], [address] Phone: [phone]   Fax:  [phone]  Name: [name] MRN: [id number] Date of Birth: [birth year]

## 2020-01-25 ENCOUNTER — Telehealth (HOSPITAL_COMMUNITY): Payer: Self-pay | Admitting: Emergency Medicine

## 2020-01-25 NOTE — Telephone Encounter (Signed)
Reaching out to patient to offer assistance regarding upcoming cardiac imaging study; pt verbalizes understanding of appt date/time, parking situation and where to check in, pre-test NPO status and medications ordered, and verified current allergies; name and call back number provided for further questions should they arise Felicia Bond RN Navigator Cardiac Imaging [facility] [phone] office [phone] cell   Pt verbalized understanding to take PO metoprolol 2 hr prior to scan

## 2020-01-28 ENCOUNTER — Other Ambulatory Visit: Payer: Self-pay

## 2020-01-28 ENCOUNTER — Encounter: Payer: Medicare Other | Admitting: *Deleted

## 2020-01-28 ENCOUNTER — Encounter (HOSPITAL_COMMUNITY): Payer: Self-pay

## 2020-01-28 ENCOUNTER — Ambulatory Visit (HOSPITAL_COMMUNITY)
Admission: RE | Admit: 2020-01-28 | Discharge: 2020-01-28 | Disposition: A | Discharge status: Home / Self Care | Payer: Medicare Other | Source: Ambulatory Visit | Attending: Cardiology | Admitting: Cardiology

## 2020-01-28 DIAGNOSIS — R072 Precordial pain: Secondary | ICD-10-CM

## 2020-01-28 DIAGNOSIS — Z8249 Family history of ischemic heart disease and other diseases of the circulatory system: Secondary | ICD-10-CM

## 2020-01-28 DIAGNOSIS — Z006 Encounter for examination for normal comparison and control in clinical research program: Secondary | ICD-10-CM

## 2020-01-28 DIAGNOSIS — R0789 Other chest pain: Secondary | ICD-10-CM | POA: Insufficient documentation

## 2020-01-28 MED ORDER — NITROGLYCERIN 0.4 MG SL SUBL: 0.8000 mg | SUBLINGUAL_TABLET | Freq: Once | SUBLINGUAL | Status: AC

## 2020-01-28 MED ORDER — NITROGLYCERIN 0.4 MG SL SUBL
SUBLINGUAL_TABLET | SUBLINGUAL | Status: AC
  Administered 2020-01-28: 0.8 mg via SUBLINGUAL
  Filled 2020-01-28: qty 2

## 2020-01-28 MED ORDER — IOHEXOL 350 MG/ML SOLN
80.0000 mL | Freq: Once | INTRAVENOUS | Status: AC | PRN
  Administered 2020-01-28: 80 mL via INTRAVENOUS

## 2020-01-28 NOTE — Research (Addendum)
Subject Name: [name]  Subject met inclusion and exclusion criteria.  The informed consent form, study requirements and expectations were reviewed with the subject and questions and concerns were addressed prior to the signing of the consent form.  The subject verbalized understanding of the trial requirements.  The subject agreed to participate in the IDENTIFY trial and signed the informed consent at 1426 on 01/28/20  The informed consent was obtained prior to performance of any protocol-specific procedures for the subject.  A copy of the signed informed consent was given to the subject and a copy was placed in the subject's medical record.   Timoteo Gaul

## 2020-01-29 DIAGNOSIS — R490 Dysphonia: Secondary | ICD-10-CM | POA: Diagnosis not present

## 2020-01-29 DIAGNOSIS — Z87891 Personal history of nicotine dependence: Secondary | ICD-10-CM | POA: Diagnosis not present

## 2020-01-29 DIAGNOSIS — M0609 Rheumatoid arthritis without rheumatoid factor, multiple sites: Secondary | ICD-10-CM | POA: Diagnosis not present

## 2020-01-29 DIAGNOSIS — K449 Diaphragmatic hernia without obstruction or gangrene: Secondary | ICD-10-CM | POA: Diagnosis not present

## 2020-01-29 DIAGNOSIS — J385 Laryngeal spasm: Secondary | ICD-10-CM | POA: Diagnosis not present

## 2020-01-29 DIAGNOSIS — Z880 Allergy status to penicillin: Secondary | ICD-10-CM | POA: Diagnosis not present

## 2020-01-29 DIAGNOSIS — K219 Gastro-esophageal reflux disease without esophagitis: Secondary | ICD-10-CM | POA: Diagnosis not present

## 2020-01-29 DIAGNOSIS — Z885 Allergy status to narcotic agent status: Secondary | ICD-10-CM | POA: Diagnosis not present

## 2020-01-29 DIAGNOSIS — J3801 Paralysis of vocal cords and larynx, unilateral: Secondary | ICD-10-CM | POA: Diagnosis not present

## 2020-01-29 DIAGNOSIS — J383 Other diseases of vocal cords: Secondary | ICD-10-CM | POA: Diagnosis not present

## 2020-01-29 DIAGNOSIS — Z881 Allergy status to other antibiotic agents status: Secondary | ICD-10-CM | POA: Diagnosis not present

## 2020-01-29 DIAGNOSIS — Z79899 Other long term (current) drug therapy: Secondary | ICD-10-CM | POA: Diagnosis not present

## 2020-01-31 ENCOUNTER — Telehealth: Payer: Self-pay | Admitting: *Deleted

## 2020-01-31 MED ORDER — ROSUVASTATIN CALCIUM 20 MG PO TABS: 20.0000 mg | ORAL_TABLET | Freq: Every day | ORAL | 3 refills | Status: DC

## 2020-01-31 NOTE — Telephone Encounter (Signed)
LAD calcification is present and 25-49% stenosis. Non flow limiting. Aortic atherosclerosis is seen as well.  Recommend Crestor 20mg  PO QD Repeat lipids and ALT in 3 months.   Candee Furbish, MD  Reviewed above information with patient. RX sent electronically to [facility] as pt's request.  Order mailed to home address for blood work in 3 months.  Pt had no further questions at the time of the call.

## 2020-02-06 ENCOUNTER — Telehealth: Payer: Self-pay | Admitting: Family Medicine

## 2020-02-06 DIAGNOSIS — M1711 Unilateral primary osteoarthritis, right knee: Secondary | ICD-10-CM | POA: Diagnosis not present

## 2020-02-06 NOTE — Telephone Encounter (Signed)
Left message for patient to call back and schedule Medicare Annual Wellness Visit (AWVI) either virtually or in office.   Last AWV no information  please schedule at anytime with [facility] Nurse Health Advisor 1 or 2   This should be a 45 minute visit.

## 2020-02-08 ENCOUNTER — Ambulatory Visit (HOSPITAL_COMMUNITY): Payer: Medicare Other | Attending: Internal Medicine

## 2020-02-08 ENCOUNTER — Ambulatory Visit (INDEPENDENT_AMBULATORY_CARE_PROVIDER_SITE_OTHER): Payer: Medicare Other

## 2020-02-08 ENCOUNTER — Other Ambulatory Visit: Payer: Self-pay

## 2020-02-08 DIAGNOSIS — R0789 Other chest pain: Secondary | ICD-10-CM

## 2020-02-08 DIAGNOSIS — R55 Syncope and collapse: Secondary | ICD-10-CM | POA: Insufficient documentation

## 2020-02-08 LAB — ECHOCARDIOGRAM COMPLETE
Area-P 1/2: 3.48 cm2
S' Lateral: 3.5 cm

## 2020-02-08 MED ORDER — PERFLUTREN LIPID MICROSPHERE
1.0000 mL | INTRAVENOUS | Status: AC | PRN
  Administered 2020-02-08: 1 mL via INTRAVENOUS

## 2020-02-14 ENCOUNTER — Telehealth: Payer: Self-pay | Admitting: *Deleted

## 2020-02-14 NOTE — Telephone Encounter (Signed)
   Primary Cardiologist: Candee Furbish, MD  Chart reviewed as part of pre-operative protocol coverage. Given past medical history and time since last visit, based on ACC/AHA guidelines, [name] would be at acceptable risk for the planned procedure without further cardiovascular testing.   I will route this recommendation to the requesting party via Epic fax function and remove from pre-op pool.  Please call with questions.  Jossie Ng. Inesha Sow NP-C    02/14/2020, 12:15 PM [name] Office [phone] Fax [phone]

## 2020-02-14 NOTE — Telephone Encounter (Signed)
   [facility] Pre-operative Risk Assessment    HEARTCARE STAFF: - Please ensure there is not already an duplicate clearance open for this procedure. - Under Visit Info/Reason for Call, type in Other and utilize the format Clearance MM/DD/YY or Clearance TBD. Do not use dashes or single digits. - If request is for dental extraction, please clarify the # of teeth to be extracted.  Request for surgical clearance:  1. What type of surgery is being performed? RIGHT TOTAL KNEE ARTHROPLASTY   2. When is this surgery scheduled? 03/18/20   3. What type of clearance is required (medical clearance vs. Pharmacy clearance to hold med vs. Both)? MEDICAL  4. Are there any medications that need to be held prior to surgery and how long? NONE LISTED   5. Practice name and name of physician performing surgery? [facility]; DR. Ponderay   6. What is the office phone number? [phone]   7.   What is the office fax number? [place]  8.   Anesthesia type (None, local, MAC, general) ? SPINAL   Julaine Hua 02/14/2020, 11:50 AM  _________________________________________________________________   (provider comments below)

## 2020-02-26 ENCOUNTER — Encounter: Payer: Self-pay | Admitting: Cardiology

## 2020-02-26 ENCOUNTER — Ambulatory Visit: Payer: Medicare Other | Admitting: Cardiology

## 2020-02-26 ENCOUNTER — Other Ambulatory Visit: Payer: Self-pay

## 2020-02-26 VITALS — BP 120/84 | HR 77 | Ht 64.0 in | Wt 214.2 lb

## 2020-02-26 DIAGNOSIS — Z8249 Family history of ischemic heart disease and other diseases of the circulatory system: Secondary | ICD-10-CM

## 2020-02-26 DIAGNOSIS — E7800 Pure hypercholesterolemia, unspecified: Secondary | ICD-10-CM | POA: Diagnosis not present

## 2020-02-26 DIAGNOSIS — I2510 Atherosclerotic heart disease of native coronary artery without angina pectoris: Secondary | ICD-10-CM

## 2020-02-26 MED ORDER — ASPIRIN EC 81 MG PO TBEC: 81.0000 mg | DELAYED_RELEASE_TABLET | Freq: Every day | ORAL | 11 refills | Status: DC

## 2020-02-26 NOTE — Patient Instructions (Signed)
Medication Instructions:  Please start Aspirin 81 mg a day.   Continue all other medications as listed.  *If you need a refill on your cardiac medications before your next appointment, please call your pharmacy*  Lab Work: Please have Lipid/ALT lab work completed around the beginning of March 2022.  This written order was mailed to your home address as requested 01/31/2020.  If you have labs (blood work) drawn today and your tests are completely normal, you will receive your results only by: Marland Kitchen MyChart Message (if you have MyChart) OR . A paper copy in the mail If you have any lab test that is abnormal or we need to change your treatment, we will call you to review the results.  Follow-Up: At [facility], you and your health needs are our priority.  As part of our continuing mission to provide you with exceptional heart care, we have created designated Provider Care Teams.  These Care Teams include your primary Cardiologist (physician) and Advanced Practice Providers (APPs -  Physician Assistants and Nurse Practitioners) who all work together to provide you with the care you need, when you need it.  We recommend signing up for the patient portal called "MyChart".  Sign up information is provided on this After Visit Summary.  MyChart is used to connect with patients for Virtual Visits (Telemedicine).  Patients are able to view lab/test results, encounter notes, upcoming appointments, etc.  Non-urgent messages can be sent to your provider as well.   To learn more about what you can do with MyChart, go to [email or web address].    Your next appointment:   12 month(s)  The format for your next appointment:   In Person  Provider:   Donato Schultz, MD   Thank you for choosing [facility]!!

## 2020-02-26 NOTE — Progress Notes (Signed)
Cardiology Office Note:    Date:  02/26/2020   ID:  Kathi Der, DOB 22-Oct-1955, MRN [id number]  PCP:  Kristian Covey, MD  [facility] Cardiologist:  Donato Schultz, MD  [facility] Electrophysiologist:  None   Referring MD: Kristian Covey, MD     History of Present Illness:    [name] is a 65 y.o. female here for CT and ECHO review. Atypical chest pain.   Dr. Caryl Never, his note from 12/24/2019, she had a few days of "feeling bad "intermittent nausea few episodes of vomiting.  No abdominal pain.  She seemed to be more fatigued than usual.  She drove up to [place] and she had a sensation of feeling very hot all over with waves of nausea and potentially a brief loss of consciousness.  May be a brief syncopal episode.  She has had appendectomy and cholecystectomy and hysterectomy as well as gastric sleeve.  Hyperlipidemia non-smoker nondiabetic  Brother, 3 sisters and MI, one died.  All before 50. Son MI.   Overall pleased with her results of echocardiogram and cardiac CT. She does have calcified plaque but this is nonflow limiting. Aortic atherosclerosis is noted as well. Goal-directed therapy with prevention efforts. Overall she feels well. No current chest pain fevers chills nausea vomiting syncope bleeding. She is tolerating her Crestor 20 mg well. Needs a repeat lipid panel.  Past Medical History:  Diagnosis Date  . ANGIOMA 10/03/2008   REMOVED FROM RIGHT LOWER LEG-BENIGN  . Anxiety   . Arthritis    RA  . Bronchitis    hx of   . Cancer (HCC)    BASAL CELL SKIN CANCER  . Complication of anesthesia   . Fibromyalgia   . GERD 08/27/2008  . History of blood transfusion    1995  . History of hiatal hernia    hx of has had surgically repaired  . History of measles   . History of mumps   . History of nonmelanoma skin cancer   . HYPERLIPIDEMIA 08/27/2008  . HYPERTENSION 08/27/2008   pt is currently on no meds   . Pneumonia    hx of   . PONV  (postoperative nausea and vomiting)   . Rheumatoid arthritis (HCC)   . SEBORRHEIC KERATOSIS 08/27/2008    Past Surgical History:  Procedure Laterality Date  . ABDOMINAL HYSTERECTOMY  1995  . APPENDECTOMY    . BREAST BIOPSY Left   . BREAST DUCTAL SYSTEM EXCISION  08/16/2011   Procedure: EXCISION DUCTAL SYSTEM BREAST;  Surgeon: Clovis Pu. Cornett, MD;  Location: [facility];  Service: General;  Laterality: Left;  left breast duct excision  . CESAREAN SECTION  1980   times 2; 1980 and 1982  . CESAREAN SECTION  1982  . CHOLECYSTECTOMY  1997  . EXCISION/RELEASE BURSA HIP Right 10/21/2014   Procedure: OPEN RIGHT HIP BURSECTOMY, EXOSECTOMY;  Surgeon: Durene Romans, MD;  Location: [facility];  Service: Orthopedics;  Laterality: Right;  . foot surgery  2004, 2005, 2008   bilat secondary to neuropathy   . JOINT REPLACEMENT  10,12   lt total knee X2  . KNEE ARTHROSCOPY  2007, 2008, 2009  . LAPAROSCOPIC GASTRIC SLEEVE RESECTION N/A 10/30/2013   Procedure: LAPAROSCOPIC GASTRIC SLEEVE RESECTION WITH HIATAL HERNIA REPAIR AND UPPER ENDOSCOPY;  Surgeon: Gaynelle Adu, MD;  Location: [facility];  Service: General;  Laterality: N/A;  . NASAL SINUS SURGERY  2013  . OPEN SURGICAL REPAIR OF GLUTEAL TENDON  01/17/2012   Procedure: OPEN SURGICAL REPAIR OF GLUTEAL TENDON;  Surgeon: Mauri Pole, MD;  Location: [facility];  Service: Orthopedics;  Laterality: Right;    Current Medications: Current Meds  Medication Sig  . acetaminophen (TYLENOL) 500 MG tablet Take 1,000 mg by mouth every 6 (six) hours as needed for pain.   Marland Kitchen aspirin EC 81 MG tablet Take 1 tablet (81 mg total) by mouth daily. Swallow whole.  . B-D TB SYRINGE 1CC/27GX1/2" 27G X 1/2" 1 ML MISC   . Biotin 1000 MCG tablet Take 3,000 mcg by mouth daily.  Marland Kitchen EPINEPHrine (EPIPEN 2-PAK) 0.3 mg/0.3 mL IJ SOAJ injection Inject 0.3 mLs (0.3 mg total) into the muscle once.  . etanercept (ENBREL) 50 MG/ML injection Inject 0.98 mLs (50 mg total) into the skin  once a week. Resume end of next week  . famotidine (PEPCID) 40 MG tablet Take 40 mg by mouth daily.  . folic acid (FOLVITE) 1 MG tablet Take 2 mg by mouth daily.  . hydroxychloroquine (PLAQUENIL) 200 MG tablet TAKE 1 TABLET BY MOUTH ONCE DAILY WITH FOOD OR MILK FOR 90 DAYS  . linaclotide (LINZESS) 290 MCG CAPS capsule 1 cap(s)  . methotrexate (50 MG/ML) 1 gm SOLR May resume previous dose at  end of next week  . oxyCODONE-acetaminophen (PERCOCET/ROXICET) 5-325 MG tablet TAKE 1 TABLET AS NEEDED EVERY 8 HOURS FOR PAIN  . pantoprazole (PROTONIX) 40 MG tablet Take 40 mg by mouth daily.  . predniSONE (DELTASONE) 5 MG tablet Take 5 mg by mouth daily with breakfast.  . promethazine (PHENERGAN) 25 MG tablet Take 1 tablet (25 mg total) by mouth every 8 (eight) hours as needed for nausea or vomiting.  . rosuvastatin (CRESTOR) 20 MG tablet Take 1 tablet (20 mg total) by mouth daily.  Marland Kitchen scopolamine (TRANSDERM-SCOP) 1 MG/3DAYS Place 1 patch (1.5 mg total) onto the skin every 3 (three) days. Behind ear  . sertraline (ZOLOFT) 50 MG tablet TAKE 1 & 1/2 (ONE & ONE-HALF) TABLETS BY MOUTH ONCE DAILY  . zolpidem (AMBIEN) 10 MG tablet TAKE 1 TABLET BY MOUTH AT BEDTIME AS NEEDED FOR SLEEP     Allergies:   Bee venom, Codeine sulfate, Keflex [cephalexin], and Penicillins   Social History   Socioeconomic History  . Marital status: Married    Spouse name: Not on file  . Number of children: 2  . Years of education: Not on file  . Highest education level: High school graduate  Occupational History  . Occupation: retired    Comment: Building services engineer  Tobacco Use  . Smoking status: Former Smoker    Packs/day: 0.25    Years: 4.00    Pack years: 1.00    Types: Cigarettes    Quit date: 02/23/1984    Years since quitting: 36.0  . Smokeless tobacco: Never Used  Vaping Use  . Vaping Use: Never used  Substance and Sexual Activity  . Alcohol use: No  . Drug use: No  . Sexual activity: Not on file  Other Topics  Concern  . Not on file  Social History Narrative   Lives at home with husband   Disabled   Right handed   Caffeine: 1 cup of coffee daily   Social Determinants of Health   Financial Resource Strain: Not on file  Food Insecurity: Not on file  Transportation Needs: Not on file  Physical Activity: Not on file  Stress: Not on file  Social Connections: Not on file  Family History: The patient's family history includes Breast cancer (age of onset: 40) in her mother; Cancer in her mother; Diabetes in her son; Epilepsy in her father; Heart attack in her sister and sister; Kidney disease in her father.  ROS:   Please see the history of present illness.     All other systems reviewed and are negative.  EKGs/Labs/Other Studies Reviewed:    The following studies were reviewed today:  Echo 02/08/20:   1. Left ventricular ejection fraction, by estimation, is 60 to 65%. The  left ventricle has normal function. The left ventricle has no regional  wall motion abnormalities. Left ventricular diastolic parameters were  normal.  2. Right ventricular systolic function is normal. The right ventricular  size is normal. There is mildly elevated pulmonary artery systolic  pressure. The estimated right ventricular systolic pressure is 123456 mmHg.  3. The mitral valve is normal in structure. Mild mitral valve  regurgitation. No evidence of mitral stenosis.  4. The aortic valve is normal in structure. Aortic valve regurgitation is  not visualized. No aortic stenosis is present.  5. The inferior vena cava is normal in size with greater than 50%  respiratory variability, suggesting right atrial pressure of 3 mmHg.   Cardiac CT 01/28/20:  1. Coronary calcium score of 71. This was 37 percentile for age and sex matched control.  2. Normal coronary origin with right dominance.  3. There is focal calcified proximal LAD plaque that is 25-49% stenosis. Non flow limiting.  4. CAD-RADS 2.  Mild non-obstructive CAD (25-49%). Consider non-atherosclerotic causes of chest pain. Consider preventive therapy and risk factor modification.  5.  Aortic atherosclerosis.   Recent Labs: 12/05/2019: ALT 11; TSH 2.30 12/24/2019: Hemoglobin 13.6; Platelets 325 01/23/2020: BUN 13; Creatinine, Ser 0.54; Potassium 4.2; Sodium 141  Recent Lipid Panel    Component Value Date/Time   CHOL 220 (H) 12/05/2019 1021   TRIG 124 12/05/2019 1021   HDL 84 12/05/2019 1021   CHOLHDL 2.6 12/05/2019 1021   VLDL 21.2 05/16/2017 1540   LDLCALC 113 (H) 12/05/2019 1021     Risk Assessment/Calculations:       Physical Exam:    VS:  BP 120/84   Pulse 77   Ht 5\' 4"  (1.626 m)   Wt 214 lb 3.2 oz (97.2 kg)   SpO2 99%   BMI 36.77 kg/m     Wt Readings from Last 3 Encounters:  02/26/20 214 lb 3.2 oz (97.2 kg)  01/15/20 214 lb 12.8 oz (97.4 kg)  12/24/19 210 lb 9.6 oz (95.5 kg)     GEN:  Well nourished, well developed in no acute distress HEENT: Normal NECK: No JVD; No carotid bruits LYMPHATICS: No lymphadenopathy CARDIAC: RRR, no murmurs, rubs, gallops RESPIRATORY:  Clear to auscultation without rales, wheezing or rhonchi  ABDOMEN: Soft, non-tender, non-distended MUSCULOSKELETAL:  No edema; No deformity  SKIN: Warm and dry NEUROLOGIC:  Alert and oriented x 3 PSYCHIATRIC:  Normal affect   ASSESSMENT:    1. Coronary artery disease involving native coronary artery of native heart without angina pectoris   2. Pure hypercholesterolemia   3. Family history of early CAD    PLAN:    In order of problems listed above:  CAD  --LAD calcification is present and 25-49% stenosis. Non flow limiting. Aortic atherosclerosis is seen as well. --Crestor 20mg  PO QD started in December 2021. We will check a lipid panel and ALT in 3 months. --LDL 113 on 12/05/19 --previously stopped lisinopril  10mg  in case BP was getting too low.  Aortic atherosclerosis -Statin, low-dose aspirin. Watch for any signs  of bleeding. Overall doing well.  Knee osteoarthritis -May proceed with knee replacement, right. She has had her left knee operated on twice. Currently low risk from a cardiovascular perspective. She may hold her aspirin for 5 days prior to surgery if necessary.  1 year follow-up   Medication Adjustments/Labs and Tests Ordered: Current medicines are reviewed at length with the patient today.  Concerns regarding medicines are outlined above.  No orders of the defined types were placed in this encounter.  Meds ordered this encounter  Medications  . aspirin EC 81 MG tablet    Sig: Take 1 tablet (81 mg total) by mouth daily. Swallow whole.    Dispense:  30 tablet    Refill:  11    Patient Instructions  Medication Instructions:  Please start Aspirin 81 mg a day.   Continue all other medications as listed.  *If you need a refill on your cardiac medications before your next appointment, please call your pharmacy*  Lab Work: Please have Lipid/ALT lab work completed around the beginning of March 2022.  This written order was mailed to your home address as requested 01/31/2020.  If you have labs (blood work) drawn today and your tests are completely normal, you will receive your results only by: Marland Kitchen MyChart Message (if you have MyChart) OR . A paper copy in the mail If you have any lab test that is abnormal or we need to change your treatment, we will call you to review the results.  Follow-Up: At [facility], you and your health needs are our priority.  As part of our continuing mission to provide you with exceptional heart care, we have created designated Provider Care Teams.  These Care Teams include your primary Cardiologist (physician) and Advanced Practice Providers (APPs -  Physician Assistants and Nurse Practitioners) who all work together to provide you with the care you need, when you need it.  We recommend signing up for the patient portal called "MyChart".  Sign up information  is provided on this After Visit Summary.  MyChart is used to connect with patients for Virtual Visits (Telemedicine).  Patients are able to view lab/test results, encounter notes, upcoming appointments, etc.  Non-urgent messages can be sent to your provider as well.   To learn more about what you can do with MyChart, go to [email or web address].    Your next appointment:   12 month(s)  The format for your next appointment:   In Person  Provider:   Candee Furbish, MD   Thank you for choosing [facility]!!         Signed, Candee Furbish, MD  02/26/2020 2:14 PM    [place]

## 2020-02-28 DIAGNOSIS — R55 Syncope and collapse: Secondary | ICD-10-CM | POA: Diagnosis not present

## 2020-03-10 ENCOUNTER — Encounter (HOSPITAL_COMMUNITY): Payer: Self-pay

## 2020-03-10 NOTE — Progress Notes (Signed)
COVID Vaccine Completed: x3 Date COVID Vaccine completed:  05-14-19, 06-11-19, 11-23-19 COVID vaccine manufacturer:  Moderna      PCP - Carolann Littler, MD Cardiologist - Candee Furbish, MD  Cardiac clearance in chart dated 02-14-20 by Coletta Memos, NP-C  Chest x-ray - CT chest 10-18-19 Care Everywhere  EKG - 12-24-19 in Epic Stress Test -  ECHO - 02-08-20 in Epic Cardiac Cath -  Pacemaker/ICD device last checked: Long Term Monitor - 02-08-20 in epic Cardiac CT - 01-28-20 in Epic  Sleep Study - 09-04-2012 in Epic CPAP -   Fasting Blood Sugar -  Checks Blood Sugar _____ times a day  Blood Thinner Instructions: Aspirin Instructions:ASA 81 mg  Last Dose:  Anesthesia review: CAD, hx of chest pain with recent cardiac workup  Patient denies shortness of breath, fever, cough and chest pain at PAT appointment   Patient verbalized understanding of instructions that were given to them at the PAT appointment. Patient was also instructed that they will need to review over the PAT instructions again at home before surgery.

## 2020-03-10 NOTE — H&P (Addendum)
TOTAL KNEE ADMISSION H&P  Patient is being admitted for right total knee arthroplasty.  Subjective:  Chief Complaint:  Right knee OA / pain  HPI: [name], 65 y.o. female, has a history of pain and functional disability in the right knee due to arthritis and has failed non-surgical conservative treatments for greater than 12 weeks to include NSAID's and/or analgesics, corticosteriod injections and activity modification.  Onset of symptoms was gradual, starting 2 years ago with gradually worsening course since that time. The patient noted prior procedures on the knee to include  arthroscopy on the right knee(s).  Patient currently rates pain in the right knee(s) at 8 out of 10 with activity. Patient has night pain, worsening of pain with activity and weight bearing, pain that interferes with activities of daily living, pain with passive range of motion, crepitus and joint swelling.  Patient has evidence of periarticular osteophytes and joint space narrowing by imaging studies. There is no active infection.  Risks, benefits and expectations were discussed with the patient.  Risks including but not limited to the risk of anesthesia, blood clots, nerve damage, blood vessel damage, failure of the prosthesis, infection and up to and including death.  Patient understand the risks, benefits and expectations and wishes to proceed with surgery.   D/C Plans:       Home (obs)  Post-op Meds:       No Rx given   Tranexamic Acid:      To be given - IV   Decadron:      Is to be given  FYI:      ASA  Dilaudid  Zofran Rx need for home  DME:   Pt equipment arranged  PT:   OPPT arranged   Pharmacy: [facility]    Patient Active Problem List   Diagnosis Date Noted  . Pulmonary nodules 12/05/2019  . Bilateral occipital neuralgia 09/26/2017  . Status post left foot surgery 07/07/2015  . Neuroma 05/20/2015  . HAV (hallux abducto valgus) 05/20/2015  . Hammertoe 05/20/2015  . Prominent  metatarsal head 05/20/2015  . Chronic foot pain 05/20/2015  . Anxiety state 03/03/2015  . Hip bursitis 10/22/2014  . Osteophyte of hip 10/21/2014  . Epigastric cramping 11/09/2013  . Nausea with vomiting 11/06/2013  . Rheumatoid arthritis ([place]) 11/01/2013  . S/P laparoscopic sleeve gastrectomy 10/30/2013  . Anemia 06/04/2013  . CAP (community acquired pneumonia) 02/17/2013  . Restless leg syndrome 09/18/2012  . Morbid obesity with BMI of 45.0-49.9, adult ([place]) 01/18/2012  . Right gluteus tear 01/17/2012  . Nipple discharge in female 07/08/2011  . Chronic insomnia 06/18/2011  . Discharge from nipple 01/06/2011  . TMJ PAIN 01/12/2010  . DEPRESSION 12/10/2009  . SINUSITIS, ACUTE 12/30/2008  . Lumbago 12/30/2008  . EDEMA 10/03/2008  . Hyperlipidemia 08/27/2008  . Essential hypertension 08/27/2008  . GERD 08/27/2008   Past Medical History:  Diagnosis Date  . ANGIOMA 10/03/2008   REMOVED FROM RIGHT LOWER LEG-BENIGN  . Anxiety   . Aortic atherosclerosis ([place])   . Arthritis    RA  . Bronchitis    hx of   . Cancer (HCC)    BASAL CELL SKIN CANCER  . Complication of anesthesia   . Coronary artery disease   . Depression   . Fibromyalgia   . GERD 08/27/2008  . History of blood transfusion    1995  . History of hiatal hernia    hx of has had surgically repaired  . History of  measles   . History of mumps   . History of nonmelanoma skin cancer   . HYPERLIPIDEMIA 08/27/2008  . HYPERTENSION 08/27/2008   pt is currently on no meds   . Pneumonia    hx of   . PONV (postoperative nausea and vomiting)   . Rheumatoid arthritis ([place])   . SEBORRHEIC KERATOSIS 08/27/2008  . Vocal cord paresis    hx of     Past Surgical History:  Procedure Laterality Date  . ABDOMINAL HYSTERECTOMY  1995  . APPENDECTOMY    . bilateral shoulder replacements     . BREAST BIOPSY Left   . BREAST DUCTAL SYSTEM EXCISION  08/16/2011   Procedure: EXCISION DUCTAL SYSTEM BREAST;  Surgeon: Joyice Faster. Cornett, MD;   Location: [facility];  Service: General;  Laterality: Left;  left breast duct excision  . [place]   times 2; 1980 and 1982  . [place]  . CHOLECYSTECTOMY  1997  . EXCISION/RELEASE BURSA HIP Right 10/21/2014   Procedure: OPEN RIGHT HIP BURSECTOMY, EXOSECTOMY;  Surgeon: Paralee Cancel, MD;  Location: [facility];  Service: Orthopedics;  Laterality: Right;  . foot surgery  2004, 2005, 2008   bilat secondary to neuropathy   . JOINT REPLACEMENT  10,12   lt total knee X2  . KNEE ARTHROSCOPY  2007, 2008, 2009  . LAPAROSCOPIC GASTRIC SLEEVE RESECTION N/A 10/30/2013   Procedure: LAPAROSCOPIC GASTRIC SLEEVE RESECTION WITH HIATAL HERNIA REPAIR AND UPPER ENDOSCOPY;  Surgeon: Greer Pickerel, MD;  Location: [facility];  Service: General;  Laterality: N/A;  . NASAL SINUS SURGERY  2013  . OPEN SURGICAL REPAIR OF GLUTEAL TENDON  01/17/2012   Procedure: OPEN SURGICAL REPAIR OF GLUTEAL TENDON;  Surgeon: Mauri Pole, MD;  Location: [facility];  Service: Orthopedics;  Laterality: Right;    No current facility-administered medications for this encounter.   Current Outpatient Medications  Medication Sig Dispense Refill Last Dose  . acetaminophen (TYLENOL) 650 MG CR tablet Take 1,300 mg by mouth every 8 (eight) hours.     . Biotin 10 MG CAPS Take 10 mg by mouth daily.     Marland Kitchen EPINEPHrine (EPIPEN 2-PAK) 0.3 mg/0.3 mL IJ SOAJ injection Inject 0.3 mLs (0.3 mg total) into the muscle once. (Patient taking differently: Inject 0.3 mg into the muscle as needed for anaphylaxis.) 2 each 1   . etanercept (ENBREL) 50 MG/ML injection Inject 0.98 mLs (50 mg total) into the skin once a week. Resume end of next week (Patient taking differently: Inject 50 mg into the skin every Wednesday.) 0.98 mL 0   . famotidine (PEPCID) 40 MG tablet Take 40 mg by mouth at bedtime.     . folic acid (FOLVITE) [id number] MCG tablet Take 1,600 mcg by mouth daily.     . hydroxychloroquine (PLAQUENIL) 200 MG tablet Take 200 mg by  mouth 2 (two) times daily.     . methotrexate (50 MG/ML) 1 gm SOLR May resume previous dose at  end of next week (Patient taking differently: Inject 25 mg into the muscle every Wednesday.)     . pantoprazole (PROTONIX) 40 MG tablet Take 40 mg by mouth daily before breakfast.     . predniSONE (DELTASONE) 5 MG tablet Take 5 mg by mouth daily with breakfast.     . rosuvastatin (CRESTOR) 20 MG tablet Take 1 tablet (20 mg total) by mouth daily. 90 tablet 3   . scopolamine (TRANSDERM-SCOP) 1 MG/3DAYS Place 1 patch (1.5  mg total) onto the skin every 3 (three) days. Behind ear (Patient taking differently: Place 1 patch onto the skin See admin instructions. Apply 1 patch behind ear every 72 hours as needed when traveling) 10 patch 0   . zolpidem (AMBIEN) 10 MG tablet TAKE 1 TABLET BY MOUTH AT BEDTIME AS NEEDED FOR SLEEP (Patient taking differently: Take 10 mg by mouth at bedtime.) 90 tablet 1   . aspirin EC 81 MG tablet Take 1 tablet (81 mg total) by mouth daily. Swallow whole. 30 tablet 11   . B-D TB SYRINGE 1CC/27GX1/2" 27G X 1/2" 1 ML MISC      . estradiol (ESTRACE) 0.5 MG tablet Take 1 tablet (0.5 mg total) by mouth daily. (Patient not taking: No sig reported) 30 tablet 0 Not Taking at Unknown time  . promethazine (PHENERGAN) 25 MG tablet Take 1 tablet (25 mg total) by mouth every 8 (eight) hours as needed for nausea or vomiting. (Patient not taking: No sig reported) 15 tablet 0 Not Taking at Unknown time  . sertraline (ZOLOFT) 50 MG tablet TAKE 1 & 1/2 (ONE & ONE-HALF) TABLETS BY MOUTH ONCE DAILY 135 tablet 0    Allergies  Allergen Reactions  . Bee Venom Anaphylaxis  . Codeine Sulfate Other (See Comments)    GI upset  . Keflex [Cephalexin] Rash  . Penicillins Rash    Social History   Tobacco Use  . Smoking status: Former Smoker    Packs/day: 0.25    Years: 4.00    Pack years: 1.00    Types: Cigarettes    Quit date: 02/23/1984    Years since quitting: 36.1  . Smokeless tobacco: Never Used   Substance Use Topics  . Alcohol use: No    Family History  Problem Relation Age of Onset  . Cancer Mother        breast  . Breast cancer Mother 49  . Kidney disease Father   . Epilepsy Father   . Heart attack Sister   . Heart attack Sister   . Diabetes Son      Review of Systems  Constitutional: Negative.   HENT: Negative.   Eyes: Negative.   Respiratory: Negative.   Cardiovascular: Negative.   Gastrointestinal: Positive for heartburn.  Genitourinary: Negative.   Musculoskeletal: Positive for joint pain.  Skin: Negative.   Neurological: Negative.   Endo/Heme/Allergies: Negative.   Psychiatric/Behavioral: Positive for depression. The patient has insomnia.     Objective:  Physical Exam Constitutional:      Appearance: She is well-developed.  HENT:     Head: Normocephalic.  Eyes:     Pupils: Pupils are equal, round, and reactive to light.  Neck:     Thyroid: No thyromegaly.     Vascular: No JVD.     Trachea: No tracheal deviation.  Cardiovascular:     Rate and Rhythm: Normal rate and regular rhythm.     Pulses: Intact distal pulses.  Pulmonary:     Effort: Pulmonary effort is normal. No respiratory distress.     Breath sounds: Normal breath sounds. No wheezing.  Abdominal:     Palpations: Abdomen is soft.     Tenderness: There is no abdominal tenderness. There is no guarding.  Musculoskeletal:     Cervical back: Neck supple.     Right knee: Swelling and bony tenderness present. No erythema or ecchymosis. Decreased range of motion. Tenderness present.  Lymphadenopathy:     Cervical: No cervical adenopathy.  Skin:  General: Skin is warm and dry.  Neurological:     Mental Status: She is alert and oriented to person, place, and time.  Psychiatric:        Mood and Affect: Mood and affect normal.      Labs:  Estimated body mass index is 36.77 kg/m as calculated from the following:   Height as of 02/26/20: 5\' 4"  (1.626 m).   Weight as of 02/26/20: 97.2  kg.   Imaging Review Plain radiographs demonstrate severe degenerative joint disease of the right knee.  The bone quality appears to be good for age and reported activity level.      Assessment/Plan:  End stage arthritis, right knee   The patient history, physical examination, clinical judgment of the provider and imaging studies are consistent with end stage degenerative joint disease of the right knee(s) and total knee arthroplasty is deemed medically necessary. The treatment options including medical management, injection therapy arthroscopy and arthroplasty were discussed at length. The risks and benefits of total knee arthroplasty were presented and reviewed. The risks due to aseptic loosening, infection, stiffness, patella tracking problems, thromboembolic complications and other imponderables were discussed. The patient acknowledged the explanation, agreed to proceed with the plan and consent was signed. Patient is being admitted for treatment for surgery, pain control, PT, OT, prophylactic antibiotics, VTE prophylaxis, progressive ambulation and ADL's and discharge planning. The patient is planning to be discharged home.      Patient's anticipated LOS is less than 2 midnights, meeting these requirements: - Younger than 17 - Lives within 1 hour of care - Has a competent adult at home to recover with post-op recover - NO history of  - Chronic pain requiring opiods  - Diabetes  - Coronary Artery Disease  - Heart failure  - Heart attack  - Stroke  - DVT/VTE  - Cardiac arrhythmia  - Respiratory Failure/COPD  - Renal failure  - Anemia  - Advanced Liver disease

## 2020-03-10 NOTE — Patient Instructions (Addendum)
DUE TO COVID-19 ONLY ONE VISITOR IS ALLOWED TO COME WITH YOU AND STAY IN THE WAITING ROOM ONLY DURING PRE OP AND PROCEDURE.   IF YOU WILL BE ADMITTED INTO THE HOSPITAL YOU ARE ALLOWED ONE SUPPORT PERSON DURING VISITATION HOURS ONLY (10AM -8PM)   . The support person may change daily. . The support person must pass our screening, gel in and out, and wear a mask at all times, including in the patient's room. . Patients must also wear a mask when staff or their support person are in the room.   COVID SWAB TESTING MUST BE COMPLETED ON:   Friday, 03-14-20 @    [address] [place], [place] [address]  (Must self quarantine after testing. Follow instructions on handout.)    Your procedure is scheduled on:   Tuesday, 03-18-20   Report to [facility] Main  Entrance    Report to admitting at      0610  AM   Call this number if you have problems the morning of surgery [phone]   Do not eat food :After Midnight.   May have liquids until     0540  AM day of surgery  CLEAR LIQUID DIET  Foods Allowed                                                                     Foods Excluded  Water, Black Coffee and tea, regular and decaf              liquids that you cannot  Plain Jell-O in any flavor  (No red)                                     see through such as: Fruit ices (not with fruit pulp)                                      milk, soups, orange juice              Iced Popsicles (No red)                                      All solid food                                   Apple juices Sports drinks like Gatorade (No red) Lightly seasoned clear broth or consume(fat free) Sugar, honey syrup   Complete one Ensure drink the morning of surgery at     0540   AM the day of surgery.      1. The day of surgery:  ? Drink ONE (1) Pre-Surgery Clear Ensure or G2 by am the morning of surgery. Drink in one sitting. Do not sip.  ? This drink was given to you during your hospital  pre-op  appointment visit. ? Nothing else to drink after completing the  Pre-Surgery Clear Ensure or G2.  If you have questions, please contact your surgeon's office.     Oral Hygiene is also important to reduce your risk of infection.                                    Remember - BRUSH YOUR TEETH THE MORNING OF SURGERY WITH YOUR REGULAR TOOTHPASTE   Do NOT smoke after Midnight   Take these medicines the morning of surgery with A SIP OF WATER:  Hydroxychloroquine, Pantoprazole, Prednisone, Rosuvastatin, Sertraline                                You may not have any metal on your body including hair pins, jewelry, and body piercings             Do not wear make-up, lotions, powders, perfumes/cologne, or deodorant             Do not wear nail polish.  Do not shave  48 hours prior to surgery.             Do not bring valuables to the hospital. [place].   Contacts, dentures or bridgework may not be worn into surgery.   Bring small overnight bag day of surgery.     Special Instructions: Bring a copy of your healthcare power of attorney and living will documents         the day of surgery if you haven't scanned them in before.              Please read over the following fact sheets you were given: IF YOU HAVE QUESTIONS ABOUT YOUR PRE OP INSTRUCTIONS PLEASE CALL [phone]   [place] - Preparing for Surgery Before surgery, you can play an important role.  Because skin is not sterile, your skin needs to be as free of germs as possible.  You can reduce the number of germs on your skin by washing with CHG (chlorahexidine gluconate) soap before surgery.  CHG is an antiseptic cleaner which kills germs and bonds with the skin to continue killing germs even after washing. Please DO NOT use if you have an allergy to CHG or antibacterial soaps.  If your skin becomes reddened/irritated stop using the CHG and inform your nurse when you arrive at Short  Stay. Do not shave (including legs and underarms) for at least 48 hours prior to the first CHG shower.  You may shave your face/neck.  Please follow these instructions carefully:  1.  Shower with CHG Soap the night before surgery and the  morning of surgery.  2.  If you choose to wash your hair, wash your hair first as usual with your normal  shampoo.  3.  After you shampoo, rinse your hair and body thoroughly to remove the shampoo.                             4.  Use CHG as you would any other liquid soap.  You can apply chg directly to the skin and wash.  Gently with a scrungie or clean washcloth.  5.  Apply the CHG Soap to your body ONLY FROM THE NECK DOWN.   Do   not use on face/ open  Wound or open sores. Avoid contact with eyes, ears mouth and   genitals (private parts).                       Wash face,  Genitals (private parts) with your normal soap.             6.  Wash thoroughly, paying special attention to the area where your    surgery  will be performed.  7.  Thoroughly rinse your body with warm water from the neck down.  8.  DO NOT shower/wash with your normal soap after using and rinsing off the CHG Soap.                9.  Pat yourself dry with a clean towel.            10.  Wear clean pajamas.            11.  Place clean sheets on your bed the night of your first shower and do not  sleep with pets. Day of Surgery : Do not apply any lotions/deodorants the morning of surgery.  Please wear clean clothes to the hospital/surgery center.  FAILURE TO FOLLOW THESE INSTRUCTIONS MAY RESULT IN THE CANCELLATION OF YOUR SURGERY  PATIENT SIGNATURE_________________________________  NURSE SIGNATURE__________________________________  ________________________________________________________________________   [name]  An incentive spirometer is a tool that can help keep your lungs clear and active. This tool measures how well you are filling your  lungs with each breath. Taking long deep breaths may help reverse or decrease the chance of developing breathing (pulmonary) problems (especially infection) following:  A long period of time when you are unable to move or be active. BEFORE THE PROCEDURE   If the spirometer includes an indicator to show your best effort, your nurse or respiratory therapist will set it to a desired goal.  If possible, sit up straight or lean slightly forward. Try not to slouch.  Hold the incentive spirometer in an upright position. INSTRUCTIONS FOR USE  1. Sit on the edge of your bed if possible, or sit up as far as you can in bed or on a chair. 2. Hold the incentive spirometer in an upright position. 3. Breathe out normally. 4. Place the mouthpiece in your mouth and seal your lips tightly around it. 5. Breathe in slowly and as deeply as possible, raising the piston or the ball toward the top of the column. 6. Hold your breath for 3-5 seconds or for as long as possible. Allow the piston or ball to fall to the bottom of the column. 7. Remove the mouthpiece from your mouth and breathe out normally. 8. Rest for a few seconds and repeat Steps 1 through 7 at least 10 times every 1-2 hours when you are awake. Take your time and take a few normal breaths between deep breaths. 9. The spirometer may include an indicator to show your best effort. Use the indicator as a goal to work toward during each repetition. 10. After each set of 10 deep breaths, practice coughing to be sure your lungs are clear. If you have an incision (the cut made at the time of surgery), support your incision when coughing by placing a pillow or rolled up towels firmly against it. Once you are able to get out of bed, walk around indoors and cough well. You may stop using the incentive spirometer when instructed by your caregiver.  RISKS AND COMPLICATIONS  Take your time  so you do not get dizzy or light-headed.  If you are in pain, you may need  to take or ask for pain medication before doing incentive spirometry. It is harder to take a deep breath if you are having pain. AFTER USE  Rest and breathe slowly and easily.  It can be helpful to keep track of a log of your progress. Your caregiver can provide you with a simple table to help with this. If you are using the spirometer at home, follow these instructions: [place] IF:   You are having difficultly using the spirometer.  You have trouble using the spirometer as often as instructed.  Your pain medication is not giving enough relief while using the spirometer.  You develop fever of 100.5 F (38.1 C) or higher. SEEK IMMEDIATE MEDICAL CARE IF:   You cough up bloody sputum that had not been present before.  You develop fever of 102 F (38.9 C) or greater.  You develop worsening pain at or near the incision site. MAKE SURE YOU:   Understand these instructions.  Will watch your condition.  Will get help right away if you are not doing well or get worse. Document Released: 06/21/2006 Document Revised: 05/03/2011 Document Reviewed: 08/22/2006 Exit[employer] Patient Information 2014 [employer], [place].   ________________________________________________________________________  WHAT IS A BLOOD TRANSFUSION? Blood Transfusion Information  A transfusion is the replacement of blood or some of its parts. Blood is made up of multiple cells which provide different functions.  Red blood cells carry oxygen and are used for blood loss replacement.  White blood cells fight against infection.  Platelets control bleeding.  Plasma helps clot blood.  Other blood products are available for specialized needs, such as hemophilia or other clotting disorders. BEFORE THE TRANSFUSION  Who gives blood for transfusions?   Healthy volunteers who are fully evaluated to make sure their blood is safe. This is blood bank blood. Transfusion therapy is the safest it has ever been in the  practice of medicine. Before blood is taken from a donor, a complete history is taken to make sure that person has no history of diseases nor engages in risky social behavior (examples are intravenous drug use or sexual activity with multiple partners). The donor's travel history is screened to minimize risk of transmitting infections, such as malaria. The donated blood is tested for signs of infectious diseases, such as HIV and hepatitis. The blood is then tested to be sure it is compatible with you in order to minimize the chance of a transfusion reaction. If you or a relative donates blood, this is often done in anticipation of surgery and is not appropriate for emergency situations. It takes many days to process the donated blood. RISKS AND COMPLICATIONS Although transfusion therapy is very safe and saves many lives, the main dangers of transfusion include:   Getting an infectious disease.  Developing a transfusion reaction. This is an allergic reaction to something in the blood you were given. Every precaution is taken to prevent this. The decision to have a blood transfusion has been considered carefully by your caregiver before blood is given. Blood is not given unless the benefits outweigh the risks. AFTER THE TRANSFUSION  Right after receiving a blood transfusion, you will usually feel much better and more energetic. This is especially true if your red blood cells have gotten low (anemic). The transfusion raises the level of the red blood cells which carry oxygen, and this usually causes an energy increase.  The  nurse administering the transfusion will monitor you carefully for complications. HOME CARE INSTRUCTIONS  No special instructions are needed after a transfusion. You may find your energy is better. Speak with your caregiver about any limitations on activity for underlying diseases you may have. SEEK MEDICAL CARE IF:   Your condition is not improving after your transfusion.  You  develop redness or irritation at the intravenous (IV) site. SEEK IMMEDIATE MEDICAL CARE IF:  Any of the following symptoms occur over the next 12 hours:  Shaking chills.  You have a temperature by mouth above 102 F (38.9 C), not controlled by medicine.  Chest, back, or muscle pain.  People around you feel you are not acting correctly or are confused.  Shortness of breath or difficulty breathing.  Dizziness and fainting.  You get a rash or develop hives.  You have a decrease in urine output.  Your urine turns a dark color or changes to pink, red, or brown. Any of the following symptoms occur over the next 10 days:  You have a temperature by mouth above 102 F (38.9 C), not controlled by medicine.  Shortness of breath.  Weakness after normal activity.  The white part of the eye turns yellow (jaundice).  You have a decrease in the amount of urine or are urinating less often.  Your urine turns a dark color or changes to pink, red, or brown. Document Released: 02/06/2000 Document Revised: 05/03/2011 Document Reviewed: 09/25/2007 [facility] Patient Information 2014 [place], [place].  _______________________________________________________________________

## 2020-03-12 ENCOUNTER — Encounter (HOSPITAL_COMMUNITY)
Admission: RE | Admit: 2020-03-12 | Discharge: 2020-03-12 | Disposition: A | Discharge status: Home / Self Care | Payer: Medicare Other | Source: Ambulatory Visit | Attending: Orthopedic Surgery | Admitting: Orthopedic Surgery

## 2020-03-12 ENCOUNTER — Encounter (HOSPITAL_COMMUNITY): Payer: Self-pay

## 2020-03-12 HISTORY — DX: Atherosclerosis of aorta: I70.0

## 2020-03-12 HISTORY — DX: Atherosclerotic heart disease of native coronary artery without angina pectoris: I25.10

## 2020-03-20 ENCOUNTER — Ambulatory Visit: Payer: Medicare Other | Admitting: Physical Therapy

## 2020-03-20 DIAGNOSIS — J3801 Paralysis of vocal cords and larynx, unilateral: Secondary | ICD-10-CM | POA: Diagnosis not present

## 2020-03-20 DIAGNOSIS — J385 Laryngeal spasm: Secondary | ICD-10-CM | POA: Diagnosis not present

## 2020-03-20 DIAGNOSIS — J383 Other diseases of vocal cords: Secondary | ICD-10-CM | POA: Diagnosis not present

## 2020-03-20 DIAGNOSIS — R490 Dysphonia: Secondary | ICD-10-CM | POA: Diagnosis not present

## 2020-03-24 DIAGNOSIS — G5603 Carpal tunnel syndrome, bilateral upper limbs: Secondary | ICD-10-CM | POA: Diagnosis not present

## 2020-03-24 DIAGNOSIS — M25561 Pain in right knee: Secondary | ICD-10-CM | POA: Diagnosis not present

## 2020-03-24 DIAGNOSIS — M0589 Other rheumatoid arthritis with rheumatoid factor of multiple sites: Secondary | ICD-10-CM | POA: Diagnosis not present

## 2020-03-24 DIAGNOSIS — M25512 Pain in left shoulder: Secondary | ICD-10-CM | POA: Diagnosis not present

## 2020-03-24 DIAGNOSIS — M79672 Pain in left foot: Secondary | ICD-10-CM | POA: Diagnosis not present

## 2020-03-24 DIAGNOSIS — M150 Primary generalized (osteo)arthritis: Secondary | ICD-10-CM | POA: Diagnosis not present

## 2020-03-24 DIAGNOSIS — M542 Cervicalgia: Secondary | ICD-10-CM | POA: Diagnosis not present

## 2020-03-24 DIAGNOSIS — M797 Fibromyalgia: Secondary | ICD-10-CM | POA: Diagnosis not present

## 2020-03-27 DIAGNOSIS — J3801 Paralysis of vocal cords and larynx, unilateral: Secondary | ICD-10-CM | POA: Diagnosis not present

## 2020-03-28 DIAGNOSIS — J383 Other diseases of vocal cords: Secondary | ICD-10-CM | POA: Diagnosis not present

## 2020-03-28 DIAGNOSIS — M069 Rheumatoid arthritis, unspecified: Secondary | ICD-10-CM | POA: Diagnosis not present

## 2020-03-28 DIAGNOSIS — J3801 Paralysis of vocal cords and larynx, unilateral: Secondary | ICD-10-CM | POA: Diagnosis not present

## 2020-03-28 DIAGNOSIS — J385 Laryngeal spasm: Secondary | ICD-10-CM | POA: Diagnosis not present

## 2020-03-28 DIAGNOSIS — K219 Gastro-esophageal reflux disease without esophagitis: Secondary | ICD-10-CM | POA: Diagnosis not present

## 2020-03-28 DIAGNOSIS — M1711 Unilateral primary osteoarthritis, right knee: Secondary | ICD-10-CM | POA: Diagnosis not present

## 2020-03-28 DIAGNOSIS — K449 Diaphragmatic hernia without obstruction or gangrene: Secondary | ICD-10-CM | POA: Diagnosis not present

## 2020-03-28 DIAGNOSIS — Z87891 Personal history of nicotine dependence: Secondary | ICD-10-CM | POA: Diagnosis not present

## 2020-03-28 DIAGNOSIS — Z96651 Presence of right artificial knee joint: Secondary | ICD-10-CM | POA: Diagnosis not present

## 2020-03-28 DIAGNOSIS — R490 Dysphonia: Secondary | ICD-10-CM | POA: Diagnosis not present

## 2020-04-03 ENCOUNTER — Other Ambulatory Visit: Payer: Self-pay | Admitting: Family Medicine

## 2020-04-03 NOTE — Progress Notes (Signed)
DUE TO COVID-19 ONLY ONE VISITOR IS ALLOWED TO COME WITH YOU AND STAY IN THE WAITING ROOM ONLY DURING PRE OP AND PROCEDURE DAY OF SURGERY. THE 1 VISITOR  MAY VISIT WITH YOU AFTER SURGERY IN YOUR PRIVATE ROOM DURING VISITING HOURS ONLY!  YOU NEED TO HAVE A COVID 19 TEST ON___2/18/2022 ____ @_______ , THIS TEST MUST BE DONE BEFORE SURGERY,  COVID TESTING SITE [address] [place] [address], IT IS ON THE RIGHT GOING OUT [address] APPROXIMATELY  2 MINUTES PAST [employer] ON THE RIGHT. ONCE YOUR COVID TEST IS COMPLETED,  PLEASE BEGIN THE QUARANTINE INSTRUCTIONS AS OUTLINED IN YOUR HANDOUT.                [name]  04/03/2020   Your procedure is scheduled on:04/15/2020     Report to [facility] Main  Entrance   Report to admitting at    0610am  AM     Call this number if you have problems the morning of surgery [phone]    REMEMBER: NO  SOLID FOOD CANDY OR GUM AFTER MIDNIGHT. CLEAR LIQUIDS UNTIL         . NOTHING BY MOUTH EXCEPT CLEAR LIQUIDS UNTIL 0540am    . PLEASE FINISH ENSURE DRINK PER SURGEON ORDER  WHICH NEEDS TO BE COMPLETED AT      .  0540am    CLEAR LIQUID DIET   Foods Allowed                                                                    Coffee and tea, regular and decaf                            Fruit ices (not with fruit pulp)                                      Iced Popsicles                                    Carbonated beverages, regular and diet                                    Cranberry, grape and apple juices Sports drinks like Gatorade Lightly seasoned clear broth or consume(fat free) Sugar, honey syrup ___________________________________________________________________      BRUSH YOUR TEETH MORNING OF SURGERY AND RINSE YOUR MOUTH OUT, NO CHEWING GUM CANDY OR MINTS.     Take these medicines the morning of surgery with A SIP OF WATER: protonix zoloft   DO NOT TAKE ANY DIABETIC MEDICATIONS DAY OF YOUR  SURGERY                               You may not have any metal on your body including hair pins and              piercings  Do  not wear jewelry, make-up, lotions, powders or perfumes, deodorant             Do not wear nail polish on your fingernails.  Do not shave  48 hours prior to surgery.              Men may shave face and neck.   Do not bring valuables to the hospital. [place].  Contacts, dentures or bridgework may not be worn into surgery.  Leave suitcase in the car. After surgery it may be brought to your room.     Patients discharged the day of surgery will not be allowed to drive home. IF YOU ARE HAVING SURGERY AND GOING HOME THE SAME DAY, YOU MUST HAVE AN ADULT TO DRIVE YOU HOME AND BE WITH YOU FOR 24 HOURS. YOU MAY GO HOME BY TAXI OR UBER OR ORTHERWISE, BUT AN ADULT MUST ACCOMPANY YOU HOME AND STAY WITH YOU FOR 24 HOURS.  Name and phone number of your driver:  Special Instructions: N/A              Please read over the following fact sheets you were given: _____________________________________________________________________  [facility] - Preparing for Surgery Before surgery, you can play an important role.  Because skin is not sterile, your skin needs to be as free of germs as possible.  You can reduce the number of germs on your skin by washing with CHG (chlorahexidine gluconate) soap before surgery.  CHG is an antiseptic cleaner which kills germs and bonds with the skin to continue killing germs even after washing. Please DO NOT use if you have an allergy to CHG or antibacterial soaps.  If your skin becomes reddened/irritated stop using the CHG and inform your nurse when you arrive at Short Stay. Do not shave (including legs and underarms) for at least 48 hours prior to the first CHG shower.  You may shave your face/neck. Please follow these instructions carefully:  1.  Shower with CHG Soap the night before surgery and the   morning of Surgery.  2.  If you choose to wash your hair, wash your hair first as usual with your  normal  shampoo.  3.  After you shampoo, rinse your hair and body thoroughly to remove the  shampoo.                           4.  Use CHG as you would any other liquid soap.  You can apply chg directly  to the skin and wash                       Gently with a scrungie or clean washcloth.  5.  Apply the CHG Soap to your body ONLY FROM THE NECK DOWN.   Do not use on face/ open                           Wound or open sores. Avoid contact with eyes, ears mouth and genitals (private parts).                       Wash face,  Genitals (private parts) with your normal soap.             6.  Wash thoroughly,  paying special attention to the area where your surgery  will be performed.  7.  Thoroughly rinse your body with warm water from the neck down.  8.  DO NOT shower/wash with your normal soap after using and rinsing off  the CHG Soap.                9.  Pat yourself dry with a clean towel.            10.  Wear clean pajamas.            11.  Place clean sheets on your bed the night of your first shower and do not  sleep with pets. Day of Surgery : Do not apply any lotions/deodorants the morning of surgery.  Please wear clean clothes to the hospital/surgery center.  FAILURE TO FOLLOW THESE INSTRUCTIONS MAY RESULT IN THE CANCELLATION OF YOUR SURGERY PATIENT SIGNATURE_________________________________  NURSE SIGNATURE__________________________________  ________________________________________________________________________

## 2020-04-08 ENCOUNTER — Other Ambulatory Visit: Payer: Self-pay

## 2020-04-08 ENCOUNTER — Encounter (HOSPITAL_COMMUNITY): Payer: Self-pay

## 2020-04-08 ENCOUNTER — Encounter (HOSPITAL_COMMUNITY)
Admission: RE | Admit: 2020-04-08 | Discharge: 2020-04-08 | Disposition: A | Discharge status: Home / Self Care | Payer: Medicare Other | Source: Ambulatory Visit | Attending: Orthopedic Surgery | Admitting: Orthopedic Surgery

## 2020-04-08 DIAGNOSIS — Z01812 Encounter for preprocedural laboratory examination: Secondary | ICD-10-CM | POA: Diagnosis not present

## 2020-04-08 HISTORY — DX: Depression, unspecified: F32.A

## 2020-04-08 HISTORY — DX: Paralysis of vocal cords and larynx, unspecified: J38.00

## 2020-04-08 LAB — CBC
HCT: 37.5 % (ref 36.0–46.0)
Hemoglobin: 11.9 g/dL — ABNORMAL LOW (ref 12.0–15.0)
MCH: 30 pg (ref 26.0–34.0)
MCHC: 31.7 g/dL (ref 30.0–36.0)
MCV: 94.5 fL (ref 80.0–100.0)
Platelets: 279 10*3/uL (ref 150–400)
RBC: 3.97 MIL/uL (ref 3.87–5.11)
RDW: 14 % (ref 11.5–15.5)
WBC: 7 10*3/uL (ref 4.0–10.5)
nRBC: 0 % (ref 0.0–0.2)

## 2020-04-08 LAB — SURGICAL PCR SCREEN
MRSA, PCR: NEGATIVE
Staphylococcus aureus: NEGATIVE

## 2020-04-08 LAB — BASIC METABOLIC PANEL
Anion gap: 9 (ref 5–15)
BUN: 12 mg/dL (ref 8–23)
CO2: 28 mmol/L (ref 22–32)
Calcium: 9.2 mg/dL (ref 8.9–10.3)
Chloride: 105 mmol/L (ref 98–111)
Creatinine, Ser: 0.48 mg/dL (ref 0.44–1.00)
GFR, Estimated: >60 mL/min (ref >60)
Glucose, Bld: 94 mg/dL (ref 70–99)
Potassium: 3.7 mmol/L (ref 3.5–5.1)
Sodium: 142 mmol/L (ref 135–145)

## 2020-04-08 NOTE — Progress Notes (Addendum)
.        Anesthesia Review:  PCP: DR Ivin Poot - LOV 12/24/2019  Cardiologist : DR Marlou Porch - 02/26/20- LOV Clearance in telephone encounter dated 02/14/2020  Chest x-ray :   EKG :12/24/19 Echo : 01/2020  Monitor- 03/10/2020  01/28/20- CT Cors  Stress test: Cardiac Cath :  Activity level:  Sleep Study/ CPAP : Fasting Blood Sugar :      / Checks Blood Sugar -- times a day:   Blood Thinner/ Instructions /Last Dose: ASA / Instructions/ Last Dose :  Hx of vocal cord paresis- LOV 03/28/2020 with Otolaryngology

## 2020-04-09 DIAGNOSIS — J3801 Paralysis of vocal cords and larynx, unilateral: Secondary | ICD-10-CM | POA: Diagnosis not present

## 2020-04-09 NOTE — Progress Notes (Signed)
Anesthesia Chart Review   Case: [id number] Date/Time: 04/15/20 0825   Procedure: TOTAL KNEE ARTHROPLASTY (Right Knee) - 70 mins   Anesthesia type: Spinal   Pre-op diagnosis: Right knee osteoarthritis   Location: [facility] ROOM 10 / [facility]   Surgeons: Felicia Cancel, [name]      DISCUSSION:64 y.o. former smoker with h/o PONV, GERD, CAD (nonobstructive on Cardiac CT 01/28/2020), right knee OA scheduled for above procedure 04/15/20 with Dr. Paralee [name].   Pt last seen by cardiology 02/26/2020. Per OV note,  "Knee osteoarthritis -May proceed with knee replacement, right. She has had her left knee operated on twice. Currently low risk from a cardiovascular perspective. She may hold her aspirin for 5 days prior to surgery if necessary."  Anticipate pt can proceed with planned procedure barring acute status change.   VS: BP (!) 156/81   Pulse 60   Temp 36.8 C (Oral)   Resp 16   SpO2 100%   PROVIDERS: [name] is PCP   [name] is Cardiologist  LABS: Labs reviewed: Acceptable for surgery. (all labs ordered are listed, but only abnormal results are displayed)  Labs Reviewed  CBC - Abnormal; Notable for the following components:      Result Value   Hemoglobin 11.9 (*)    All other components within normal limits  SURGICAL PCR SCREEN  BASIC METABOLIC PANEL  TYPE AND SCREEN     IMAGES:   EKG: 12/24/2019 Rate 71 bpm  Sinus rhythm  Low voltage in precordial leads   CV: Coronary CT 01/28/2020 IMPRESSION: 1. Coronary calcium score of 71. This was 20 percentile for age and sex matched control.  2. Normal coronary origin with right dominance.  3. There is focal calcified proximal LAD plaque that is 25-49% stenosis. Non flow limiting.  4. CAD-RADS 2. Mild non-obstructive CAD (25-49%). Consider non-atherosclerotic causes of chest pain. Consider preventive therapy and risk factor modification.  5.  Aortic atherosclerosis.  Echo 02/08/2020 IMPRESSIONS     1. Left ventricular ejection fraction, by estimation, is 60 to 65%. The  left ventricle has normal function. The left ventricle has no regional  wall motion abnormalities. Left ventricular diastolic parameters were  normal.  2. Right ventricular systolic function is normal. The right ventricular  size is normal. There is mildly elevated pulmonary artery systolic  pressure. The estimated right ventricular systolic pressure is 75.9 mmHg.  3. The mitral valve is normal in structure. Mild mitral valve  regurgitation. No evidence of mitral stenosis.  4. The aortic valve is normal in structure. Aortic valve regurgitation is  not visualized. No aortic stenosis is present.  5. The inferior vena cava is normal in size with greater than 50%  respiratory variability, suggesting right atrial pressure of 3 mmHg. Past Medical History:  Diagnosis Date  . ANGIOMA 10/03/2008   REMOVED FROM RIGHT LOWER LEG-BENIGN  . Anxiety   . Aortic atherosclerosis ([place])   . Arthritis    RA  . Bronchitis    hx of   . Cancer (HCC)    BASAL CELL SKIN CANCER  . Complication of anesthesia   . Coronary artery disease   . Depression   . Fibromyalgia   . GERD 08/27/2008  . History of blood transfusion    1995  . History of hiatal hernia    hx of has had surgically repaired  . History of measles   . History of mumps   . History of nonmelanoma skin cancer   .  HYPERLIPIDEMIA 08/27/2008  . HYPERTENSION 08/27/2008   pt is currently on no meds   . Pneumonia    hx of   . PONV (postoperative nausea and vomiting)   . Rheumatoid arthritis ([place])   . SEBORRHEIC KERATOSIS 08/27/2008  . Vocal cord paresis    hx of     Past Surgical History:  Procedure Laterality Date  . ABDOMINAL HYSTERECTOMY  1995  . APPENDECTOMY    . bilateral shoulder replacements     . BREAST BIOPSY Left   . BREAST DUCTAL SYSTEM EXCISION  08/16/2011   Procedure: EXCISION DUCTAL SYSTEM BREAST;  Surgeon: Joyice Faster. Cornett, [name];  Location: [facility];  Service: General;  Laterality: Left;  left breast duct excision  . [place]   times 2; 1980 and 1982  . [place]  . CHOLECYSTECTOMY  1997  . EXCISION/RELEASE BURSA HIP Right 10/21/2014   Procedure: OPEN RIGHT HIP BURSECTOMY, EXOSECTOMY;  Surgeon: Felicia Cancel, [name];  [facility];  Service: Orthopedics;  Laterality: Right;  . foot surgery  2004, 2005, 2008   bilat secondary to neuropathy   . JOINT REPLACEMENT  10,12   lt total knee X2  . KNEE ARTHROSCOPY  2007, 2008, 2009  . LAPAROSCOPIC GASTRIC SLEEVE RESECTION N/A 10/30/2013   Procedure: LAPAROSCOPIC GASTRIC SLEEVE RESECTION WITH HIATAL HERNIA REPAIR AND UPPER ENDOSCOPY;  Surgeon: Greer Pickerel, [name];  Location[facility];  Service: General;  Laterality: N/A;  . NASAL SINUS SURGERY  2013  . OPEN SURGICAL REPAIR OF GLUTEAL TENDON  01/17/2012   Procedure: OPEN SURGICAL REPAIR OF GLUTEAL TENDON;  Surgeon: Mauri Pole, [name];  [facility];  Service: Orthopedics;  Laterality: Right;    MEDICATIONS: . acetaminophen (TYLENOL) 650 MG CR tablet  . aspirin EC 81 MG tablet  . B-D TB SYRINGE 1CC/27GX1/2" 27G X 1/2" 1 ML MISC  . Biotin 10 MG CAPS  . EPINEPHrine (EPIPEN 2-PAK) 0.3 mg/0.3 mL IJ SOAJ injection  . estradiol (ESTRACE) 0.5 MG tablet  . etanercept (ENBREL) 50 MG/ML injection  . famotidine (PEPCID) 40 MG tablet  . folic acid (FOLVITE) 607 MCG tablet  . hydroxychloroquine (PLAQUENIL) 200 MG tablet  . methotrexate (50 MG/ML) 1 gm SOLR  . pantoprazole (PROTONIX) 40 MG tablet  . predniSONE (DELTASONE) 5 MG tablet  . promethazine (PHENERGAN) 25 MG tablet  . rosuvastatin (CRESTOR) 20 MG tablet  . scopolamine (TRANSDERM-SCOP) 1 MG/3DAYS  . sertraline (ZOLOFT) 50 MG tablet  . zolpidem (AMBIEN) 10 MG tablet   No current facility-administered medications for this encounter.   Felicia Felix, PA-C [facility] [phone]

## 2020-04-09 NOTE — Anesthesia Preprocedure Evaluation (Addendum)
Anesthesia Evaluation  Patient identified by MRN, date of birth, ID band Patient awake    Reviewed: Allergy & Precautions, NPO status , Patient's Chart, lab work & pertinent test results, reviewed documented beta blocker date and time   History of Anesthesia Complications (+) PONV and history of anesthetic complications  Airway Mallampati: II  TM Distance: >3 FB Neck ROM: Full    Dental  (+) Partial Lower   Pulmonary pneumonia, resolved, former smoker,    Pulmonary exam normal breath sounds clear to auscultation       Cardiovascular hypertension, Pt. on medications + CAD  Normal cardiovascular exam Rhythm:Regular Rate:Normal  Non obstructive CAD  EKG 12/23/20 NSR, low voltage precordial leads  Echo 02/08/20 1. Left ventricular ejection fraction, by estimation, is 60 to 65%. The left ventricle has normal function. The left ventricle has no regional wall motion abnormalities. Left ventricular diastolic parameters were  normal.  2. Right ventricular systolic function is normal. The right ventricular size is normal. There is mildly elevated pulmonary artery systolic pressure. The estimated right ventricular systolic pressure is 98.9 mmHg.  3. The mitral valve is normal in structure. Mild mitral valve  regurgitation. No evidence of mitral stenosis.  4. The aortic valve is normal in structure. Aortic valve regurgitation is not visualized. No aortic stenosis is present.  5. The inferior vena cava is normal in size with greater than 50% respiratory variability, suggesting right atrial pressure of 3 mmHg.    Neuro/Psych PSYCHIATRIC DISORDERS Anxiety Depression  Neuromuscular disease    GI/Hepatic Neg liver ROS, hiatal hernia, GERD  Medicated and Controlled,  Endo/Other  Obesity  Hyperlipidemia  Renal/GU negative Renal ROS  negative genitourinary   Musculoskeletal  (+) Arthritis , Rheumatoid disorders,  Fibromyalgia -DJD  right knee   Abdominal (+) + obese,   Peds  Hematology  (+) anemia ,   Anesthesia Other Findings   Reproductive/Obstetrics                            Anesthesia Physical Anesthesia Plan  ASA: III  Anesthesia Plan: Spinal   Post-op Pain Management:  Regional for Post-op pain   Induction: Intravenous  PONV Risk Score and Plan: 4 or greater and Midazolam, Propofol infusion, Ondansetron, Treatment may vary due to age or medical condition and Dexamethasone  Airway Management Planned: Natural Airway and Simple Face Mask  Additional Equipment:   Intra-op Plan:   Post-operative Plan:   Informed Consent: I have reviewed the patients History and Physical, chart, labs and discussed the procedure including the risks, benefits and alternatives for the proposed anesthesia with the patient or authorized representative who has indicated his/her understanding and acceptance.     Dental advisory given  Plan Discussed with: CRNA and Anesthesiologist  Anesthesia Plan Comments: (See PAT note 04/08/2020, Konrad Felix, PA-C)       Anesthesia Quick Evaluation

## 2020-04-11 ENCOUNTER — Other Ambulatory Visit (HOSPITAL_COMMUNITY)
Admission: RE | Admit: 2020-04-11 | Discharge: 2020-04-11 | Disposition: A | Discharge status: Home / Self Care | Payer: Medicare Other | Source: Ambulatory Visit | Attending: Orthopedic Surgery | Admitting: Orthopedic Surgery

## 2020-04-11 DIAGNOSIS — Z20822 Contact with and (suspected) exposure to covid-19: Secondary | ICD-10-CM | POA: Insufficient documentation

## 2020-04-11 DIAGNOSIS — Z01812 Encounter for preprocedural laboratory examination: Secondary | ICD-10-CM | POA: Diagnosis not present

## 2020-04-11 LAB — SARS CORONAVIRUS 2 (TAT 6-24 HRS): SARS Coronavirus 2: NEGATIVE

## 2020-04-15 ENCOUNTER — Observation Stay (HOSPITAL_COMMUNITY)
Admission: RE | Admit: 2020-04-15 | Discharge: 2020-04-17 | Disposition: A | Discharge status: Home / Self Care | Payer: Medicare Other | Source: Ambulatory Visit | Attending: Orthopedic Surgery | Admitting: Orthopedic Surgery

## 2020-04-15 ENCOUNTER — Other Ambulatory Visit: Payer: Self-pay

## 2020-04-15 ENCOUNTER — Encounter (HOSPITAL_COMMUNITY)
Admission: RE | Disposition: A | Discharge status: Home / Self Care | Payer: Self-pay | Source: Ambulatory Visit | Attending: Orthopedic Surgery

## 2020-04-15 ENCOUNTER — Encounter (HOSPITAL_COMMUNITY): Payer: Self-pay | Admitting: Orthopedic Surgery

## 2020-04-15 ENCOUNTER — Ambulatory Visit (HOSPITAL_COMMUNITY): Payer: Medicare Other | Admitting: Certified Registered Nurse Anesthetist

## 2020-04-15 ENCOUNTER — Ambulatory Visit (HOSPITAL_COMMUNITY): Payer: Medicare Other | Admitting: Physician Assistant

## 2020-04-15 DIAGNOSIS — Z87891 Personal history of nicotine dependence: Secondary | ICD-10-CM | POA: Diagnosis not present

## 2020-04-15 DIAGNOSIS — E785 Hyperlipidemia, unspecified: Secondary | ICD-10-CM | POA: Diagnosis not present

## 2020-04-15 DIAGNOSIS — I10 Essential (primary) hypertension: Secondary | ICD-10-CM | POA: Diagnosis not present

## 2020-04-15 DIAGNOSIS — Z79899 Other long term (current) drug therapy: Secondary | ICD-10-CM | POA: Insufficient documentation

## 2020-04-15 DIAGNOSIS — F419 Anxiety disorder, unspecified: Secondary | ICD-10-CM | POA: Diagnosis not present

## 2020-04-15 DIAGNOSIS — Z791 Long term (current) use of non-steroidal anti-inflammatories (NSAID): Secondary | ICD-10-CM | POA: Insufficient documentation

## 2020-04-15 DIAGNOSIS — M1711 Unilateral primary osteoarthritis, right knee: Principal | ICD-10-CM | POA: Insufficient documentation

## 2020-04-15 DIAGNOSIS — D649 Anemia, unspecified: Secondary | ICD-10-CM | POA: Insufficient documentation

## 2020-04-15 DIAGNOSIS — Z6841 Body Mass Index (BMI) 40.0 and over, adult: Secondary | ICD-10-CM | POA: Insufficient documentation

## 2020-04-15 DIAGNOSIS — G2581 Restless legs syndrome: Secondary | ICD-10-CM | POA: Insufficient documentation

## 2020-04-15 DIAGNOSIS — F329 Major depressive disorder, single episode, unspecified: Secondary | ICD-10-CM | POA: Diagnosis not present

## 2020-04-15 DIAGNOSIS — E669 Obesity, unspecified: Secondary | ICD-10-CM | POA: Diagnosis present

## 2020-04-15 DIAGNOSIS — K219 Gastro-esophageal reflux disease without esophagitis: Secondary | ICD-10-CM | POA: Insufficient documentation

## 2020-04-15 DIAGNOSIS — I2510 Atherosclerotic heart disease of native coronary artery without angina pectoris: Secondary | ICD-10-CM | POA: Diagnosis not present

## 2020-04-15 DIAGNOSIS — Z96651 Presence of right artificial knee joint: Secondary | ICD-10-CM

## 2020-04-15 DIAGNOSIS — G8918 Other acute postprocedural pain: Secondary | ICD-10-CM | POA: Diagnosis not present

## 2020-04-15 HISTORY — PX: TOTAL KNEE ARTHROPLASTY: SHX125

## 2020-04-15 LAB — TYPE AND SCREEN
ABO/RH(D): B POS
Antibody Screen: NEGATIVE

## 2020-04-15 SURGERY — ARTHROPLASTY, KNEE, TOTAL
Anesthesia: Spinal | Site: Knee | Laterality: Right

## 2020-04-15 MED ORDER — SODIUM CHLORIDE 0.9 % IR SOLN
Status: DC | PRN
  Administered 2020-04-15: 1000 mL

## 2020-04-15 MED ORDER — CEFAZOLIN SODIUM-DEXTROSE 2-4 GM/100ML-% IV SOLN
2.0000 g | INTRAVENOUS | Status: DC
  Filled 2020-04-15: qty 100

## 2020-04-15 MED ORDER — ALUM & MAG HYDROXIDE-SIMETH 200-200-20 MG/5ML PO SUSP: 15.0000 mL | ORAL | Status: DC | PRN

## 2020-04-15 MED ORDER — ROPIVACAINE HCL 7.5 MG/ML IJ SOLN
INTRAMUSCULAR | Status: DC | PRN
  Administered 2020-04-15: 20 mL via PERINEURAL

## 2020-04-15 MED ORDER — METOCLOPRAMIDE HCL 5 MG PO TABS
5.0000 mg | ORAL_TABLET | Freq: Three times a day (TID) | ORAL | Status: DC | PRN
Start: 2020-04-15 — End: 2020-04-17

## 2020-04-15 MED ORDER — KETOROLAC TROMETHAMINE 30 MG/ML IJ SOLN
INTRAMUSCULAR | Status: DC | PRN
  Administered 2020-04-15: 30 mg via INTRA_ARTICULAR

## 2020-04-15 MED ORDER — ONDANSETRON HCL 4 MG PO TABS
4.0000 mg | ORAL_TABLET | Freq: Four times a day (QID) | ORAL | Status: DC | PRN
  Administered 2020-04-15: 4 mg via ORAL
  Filled 2020-04-15: qty 1

## 2020-04-15 MED ORDER — METHOCARBAMOL 1000 MG/10ML IJ SOLN
500.0000 mg | Freq: Four times a day (QID) | INTRAVENOUS | Status: DC | PRN
  Filled 2020-04-15: qty 5

## 2020-04-15 MED ORDER — FAMOTIDINE 20 MG PO TABS
40.0000 mg | ORAL_TABLET | Freq: Every day | ORAL | Status: DC
  Administered 2020-04-15 – 2020-04-16 (×2): 40 mg via ORAL
  Filled 2020-04-15 (×2): qty 2

## 2020-04-15 MED ORDER — LIDOCAINE HCL (PF) 2 % IJ SOLN
INTRAMUSCULAR | Status: AC
  Filled 2020-04-15: qty 5

## 2020-04-15 MED ORDER — CHLORHEXIDINE GLUCONATE 0.12 % MT SOLN
15.0000 mL | Freq: Once | OROMUCOSAL | Status: AC
  Administered 2020-04-15: 15 mL via OROMUCOSAL

## 2020-04-15 MED ORDER — FERROUS SULFATE 325 (65 FE) MG PO TABS
325.0000 mg | ORAL_TABLET | Freq: Two times a day (BID) | ORAL | Status: DC
  Administered 2020-04-16 – 2020-04-17 (×3): 325 mg via ORAL
  Filled 2020-04-15 (×3): qty 1

## 2020-04-15 MED ORDER — LACTATED RINGERS IV SOLN: INTRAVENOUS | Status: DC

## 2020-04-15 MED ORDER — CELECOXIB 200 MG PO CAPS
200.0000 mg | ORAL_CAPSULE | Freq: Two times a day (BID) | ORAL | Status: DC
  Administered 2020-04-15 – 2020-04-17 (×4): 200 mg via ORAL
  Filled 2020-04-15 (×4): qty 1

## 2020-04-15 MED ORDER — VANCOMYCIN HCL IN DEXTROSE 1-5 GM/200ML-% IV SOLN
1000.0000 mg | Freq: Once | INTRAVENOUS | Status: AC
  Administered 2020-04-15: 1000 mg via INTRAVENOUS

## 2020-04-15 MED ORDER — POLYETHYLENE GLYCOL 3350 17 G PO PACK
17.0000 g | PACK | Freq: Two times a day (BID) | ORAL | Status: DC
  Administered 2020-04-15 – 2020-04-17 (×4): 17 g via ORAL
  Filled 2020-04-15 (×4): qty 1

## 2020-04-15 MED ORDER — DEXAMETHASONE SODIUM PHOSPHATE 10 MG/ML IJ SOLN
10.0000 mg | Freq: Once | INTRAMUSCULAR | Status: AC
  Administered 2020-04-15: 10 mg via INTRAVENOUS

## 2020-04-15 MED ORDER — BUPIVACAINE-EPINEPHRINE (PF) 0.25% -1:200000 IJ SOLN
INTRAMUSCULAR | Status: AC
  Filled 2020-04-15: qty 30

## 2020-04-15 MED ORDER — ONDANSETRON HCL 4 MG/2ML IJ SOLN
INTRAMUSCULAR | Status: AC
  Filled 2020-04-15: qty 2

## 2020-04-15 MED ORDER — SODIUM CHLORIDE (PF) 0.9 % IJ SOLN
INTRAMUSCULAR | Status: DC | PRN
  Administered 2020-04-15: 30 mL

## 2020-04-15 MED ORDER — ORAL CARE MOUTH RINSE: 15.0000 mL | Freq: Once | OROMUCOSAL | Status: DC

## 2020-04-15 MED ORDER — CHLORHEXIDINE GLUCONATE 0.12 % MT SOLN: 15.0000 mL | Freq: Once | OROMUCOSAL | Status: DC

## 2020-04-15 MED ORDER — METOCLOPRAMIDE HCL 5 MG/ML IJ SOLN: 5.0000 mg | Freq: Three times a day (TID) | INTRAMUSCULAR | Status: DC | PRN

## 2020-04-15 MED ORDER — SERTRALINE HCL 50 MG PO TABS
75.0000 mg | ORAL_TABLET | Freq: Every day | ORAL | Status: DC
  Administered 2020-04-16 – 2020-04-17 (×2): 75 mg via ORAL
  Filled 2020-04-15 (×2): qty 1

## 2020-04-15 MED ORDER — ZOLPIDEM TARTRATE 5 MG PO TABS
5.0000 mg | ORAL_TABLET | Freq: Every day | ORAL | Status: DC
  Administered 2020-04-15 – 2020-04-16 (×2): 5 mg via ORAL
  Filled 2020-04-15 (×2): qty 1

## 2020-04-15 MED ORDER — LIDOCAINE 2% (20 MG/ML) 5 ML SYRINGE
INTRAMUSCULAR | Status: DC | PRN
  Administered 2020-04-15: 40 mg via INTRAVENOUS

## 2020-04-15 MED ORDER — DEXAMETHASONE SODIUM PHOSPHATE 10 MG/ML IJ SOLN
10.0000 mg | Freq: Once | INTRAMUSCULAR | Status: AC
  Administered 2020-04-16: 10 mg via INTRAVENOUS
  Filled 2020-04-15: qty 1

## 2020-04-15 MED ORDER — BUPIVACAINE IN DEXTROSE 0.75-8.25 % IT SOLN
INTRATHECAL | Status: DC | PRN
  Administered 2020-04-15: 1.6 mL via INTRATHECAL

## 2020-04-15 MED ORDER — PROPOFOL 1000 MG/100ML IV EMUL
INTRAVENOUS | Status: AC
  Filled 2020-04-15: qty 100

## 2020-04-15 MED ORDER — VANCOMYCIN HCL IN DEXTROSE 1-5 GM/200ML-% IV SOLN
INTRAVENOUS | Status: AC
  Filled 2020-04-15: qty 200

## 2020-04-15 MED ORDER — ONDANSETRON HCL 4 MG/2ML IJ SOLN: 4.0000 mg | Freq: Once | INTRAMUSCULAR | Status: DC | PRN

## 2020-04-15 MED ORDER — PHENYLEPHRINE HCL-NACL 10-0.9 MG/250ML-% IV SOLN
INTRAVENOUS | Status: DC | PRN
  Administered 2020-04-15: 30 ug/min via INTRAVENOUS

## 2020-04-15 MED ORDER — FENTANYL CITRATE (PF) 100 MCG/2ML IJ SOLN
50.0000 ug | INTRAMUSCULAR | Status: DC
  Administered 2020-04-15: 100 ug via INTRAVENOUS
  Filled 2020-04-15: qty 2

## 2020-04-15 MED ORDER — PROPOFOL 500 MG/50ML IV EMUL
INTRAVENOUS | Status: DC | PRN
  Administered 2020-04-15: 75 ug/kg/min via INTRAVENOUS

## 2020-04-15 MED ORDER — DIPHENHYDRAMINE HCL 12.5 MG/5ML PO ELIX
12.5000 mg | ORAL_SOLUTION | ORAL | Status: DC | PRN
  Administered 2020-04-16: 25 mg via ORAL
  Administered 2020-04-17: 12.5 mg via ORAL
  Administered 2020-04-17: 25 mg via ORAL
  Filled 2020-04-15: qty 10
  Filled 2020-04-15: qty 5
  Filled 2020-04-15: qty 10

## 2020-04-15 MED ORDER — STERILE WATER FOR IRRIGATION IR SOLN
Status: DC | PRN
  Administered 2020-04-15: 2000 mL

## 2020-04-15 MED ORDER — HYDROCODONE-ACETAMINOPHEN 5-325 MG PO TABS
1.0000 | ORAL_TABLET | ORAL | Status: DC | PRN
  Administered 2020-04-15: 1 via ORAL
  Administered 2020-04-15: 2 via ORAL
  Filled 2020-04-15: qty 1
  Filled 2020-04-15: qty 2

## 2020-04-15 MED ORDER — VANCOMYCIN HCL 1000 MG/200ML IV SOLN
1000.0000 mg | Freq: Two times a day (BID) | INTRAVENOUS | Status: AC
  Administered 2020-04-15: 1000 mg via INTRAVENOUS
  Filled 2020-04-15: qty 200

## 2020-04-15 MED ORDER — HYDROCODONE-ACETAMINOPHEN 7.5-325 MG PO TABS
1.0000 | ORAL_TABLET | ORAL | Status: DC | PRN
  Administered 2020-04-15: 2 via ORAL
  Administered 2020-04-15: 1 via ORAL
  Administered 2020-04-16 – 2020-04-17 (×6): 2 via ORAL
  Filled 2020-04-15: qty 2
  Filled 2020-04-15: qty 1
  Filled 2020-04-15 (×6): qty 2

## 2020-04-15 MED ORDER — METHOCARBAMOL 500 MG PO TABS
500.0000 mg | ORAL_TABLET | Freq: Four times a day (QID) | ORAL | Status: DC | PRN
  Administered 2020-04-15 – 2020-04-16 (×5): 500 mg via ORAL
  Filled 2020-04-15 (×5): qty 1

## 2020-04-15 MED ORDER — SCOPOLAMINE 1 MG/3DAYS TD PT72
1.0000 | MEDICATED_PATCH | TRANSDERMAL | Status: DC
  Administered 2020-04-15: 1.5 mg via TRANSDERMAL
  Filled 2020-04-15: qty 1

## 2020-04-15 MED ORDER — PREDNISONE 5 MG PO TABS
5.0000 mg | ORAL_TABLET | Freq: Every day | ORAL | Status: DC
Start: 2020-04-17 — End: 2020-04-17
  Administered 2020-04-17: 5 mg via ORAL
  Filled 2020-04-15: qty 1

## 2020-04-15 MED ORDER — TRANEXAMIC ACID-NACL 1000-0.7 MG/100ML-% IV SOLN
1000.0000 mg | Freq: Once | INTRAVENOUS | Status: AC
  Administered 2020-04-15: 1000 mg via INTRAVENOUS
  Filled 2020-04-15: qty 100

## 2020-04-15 MED ORDER — ROSUVASTATIN CALCIUM 20 MG PO TABS
20.0000 mg | ORAL_TABLET | Freq: Every day | ORAL | Status: DC
  Administered 2020-04-16 – 2020-04-17 (×2): 20 mg via ORAL
  Filled 2020-04-15 (×2): qty 1

## 2020-04-15 MED ORDER — PHENYLEPHRINE HCL-NACL 10-0.9 MG/250ML-% IV SOLN
INTRAVENOUS | Status: AC
  Filled 2020-04-15: qty 250

## 2020-04-15 MED ORDER — TRANEXAMIC ACID-NACL 1000-0.7 MG/100ML-% IV SOLN
1000.0000 mg | INTRAVENOUS | Status: AC
  Administered 2020-04-15: 1000 mg via INTRAVENOUS
  Filled 2020-04-15: qty 100

## 2020-04-15 MED ORDER — PHENOL 1.4 % MT LIQD: 1.0000 | OROMUCOSAL | Status: DC | PRN

## 2020-04-15 MED ORDER — POVIDONE-IODINE 10 % EX SWAB
2.0000 "application " | Freq: Once | CUTANEOUS | Status: AC
  Administered 2020-04-15: 2 via TOPICAL

## 2020-04-15 MED ORDER — MENTHOL 3 MG MT LOZG: 1.0000 | LOZENGE | OROMUCOSAL | Status: DC | PRN

## 2020-04-15 MED ORDER — ASPIRIN 81 MG PO CHEW
81.0000 mg | CHEWABLE_TABLET | Freq: Two times a day (BID) | ORAL | Status: DC
  Administered 2020-04-15 – 2020-04-17 (×4): 81 mg via ORAL
  Filled 2020-04-15 (×4): qty 1

## 2020-04-15 MED ORDER — VANCOMYCIN HCL 1000 MG IV SOLR
INTRAVENOUS | Status: AC
  Filled 2020-04-15: qty 1000

## 2020-04-15 MED ORDER — KETOROLAC TROMETHAMINE 30 MG/ML IJ SOLN
INTRAMUSCULAR | Status: AC
  Filled 2020-04-15: qty 1

## 2020-04-15 MED ORDER — BISACODYL 10 MG RE SUPP: 10.0000 mg | Freq: Every day | RECTAL | Status: DC | PRN

## 2020-04-15 MED ORDER — GENTAMICIN SULFATE 40 MG/ML IJ SOLN
5.0000 mg/kg | Freq: Once | INTRAVENOUS | Status: AC
  Administered 2020-04-15: 360 mg via INTRAVENOUS
  Filled 2020-04-15: qty 9

## 2020-04-15 MED ORDER — DEXAMETHASONE SODIUM PHOSPHATE 10 MG/ML IJ SOLN
INTRAMUSCULAR | Status: AC
  Filled 2020-04-15: qty 1

## 2020-04-15 MED ORDER — ONDANSETRON HCL 4 MG/2ML IJ SOLN
4.0000 mg | Freq: Four times a day (QID) | INTRAMUSCULAR | Status: DC | PRN
  Administered 2020-04-16: 4 mg via INTRAVENOUS
  Filled 2020-04-15: qty 2

## 2020-04-15 MED ORDER — ACETAMINOPHEN 325 MG PO TABS: 325.0000 mg | ORAL_TABLET | Freq: Four times a day (QID) | ORAL | Status: DC | PRN

## 2020-04-15 MED ORDER — PROPOFOL 10 MG/ML IV BOLUS
INTRAVENOUS | Status: DC | PRN
  Administered 2020-04-15: 20 mg via INTRAVENOUS
  Administered 2020-04-15: 40 mg via INTRAVENOUS
  Administered 2020-04-15: 20 mg via INTRAVENOUS

## 2020-04-15 MED ORDER — DOCUSATE SODIUM 100 MG PO CAPS
100.0000 mg | ORAL_CAPSULE | Freq: Two times a day (BID) | ORAL | Status: DC
  Administered 2020-04-15 – 2020-04-17 (×4): 100 mg via ORAL
  Filled 2020-04-15 (×4): qty 1

## 2020-04-15 MED ORDER — MAGNESIUM CITRATE PO SOLN: 1.0000 | Freq: Once | ORAL | Status: DC | PRN

## 2020-04-15 MED ORDER — 0.9 % SODIUM CHLORIDE (POUR BTL) OPTIME
TOPICAL | Status: DC | PRN
  Administered 2020-04-15: 1000 mL

## 2020-04-15 MED ORDER — ONDANSETRON HCL 4 MG/2ML IJ SOLN
INTRAMUSCULAR | Status: DC | PRN
  Administered 2020-04-15: 4 mg via INTRAVENOUS

## 2020-04-15 MED ORDER — BUPIVACAINE-EPINEPHRINE (PF) 0.25% -1:200000 IJ SOLN
INTRAMUSCULAR | Status: DC | PRN
  Administered 2020-04-15: 30 mL

## 2020-04-15 MED ORDER — SODIUM CHLORIDE 0.9 % IV SOLN: INTRAVENOUS | Status: DC

## 2020-04-15 MED ORDER — FENTANYL CITRATE (PF) 100 MCG/2ML IJ SOLN: 25.0000 ug | INTRAMUSCULAR | Status: DC | PRN

## 2020-04-15 MED ORDER — HYDROMORPHONE HCL 1 MG/ML IJ SOLN
0.5000 mg | INTRAMUSCULAR | Status: DC | PRN
  Administered 2020-04-15 – 2020-04-16 (×4): 1 mg via INTRAVENOUS
  Filled 2020-04-15 (×4): qty 1

## 2020-04-15 MED ORDER — ORAL CARE MOUTH RINSE: 15.0000 mL | Freq: Once | OROMUCOSAL | Status: AC

## 2020-04-15 MED ORDER — MIDAZOLAM HCL 2 MG/2ML IJ SOLN
1.0000 mg | INTRAMUSCULAR | Status: DC
  Administered 2020-04-15: 2 mg via INTRAVENOUS
  Filled 2020-04-15: qty 2

## 2020-04-15 SURGICAL SUPPLY — 55 items
ADH SKN CLS APL DERMABOND .7 (GAUZE/BANDAGES/DRESSINGS) ×1
ATTUNE MED ANAT PAT 32 KNEE (Knees) ×2 IMPLANT
ATTUNE PSFEM RTSZ5 NARCEM KNEE (Femur) ×2 IMPLANT
ATTUNE PSRP INSE SZ5 7 KNEE (Insert) ×2 IMPLANT
BAG ZIPLOCK 12X15 (MISCELLANEOUS) IMPLANT
BASEPLATE TIBIAL ROTATING SZ 4 (Knees) ×2 IMPLANT
BLADE SAW SGTL 11.0X1.19X90.0M (BLADE) IMPLANT
BLADE SAW SGTL 13.0X1.19X90.0M (BLADE) ×2 IMPLANT
BLADE SURG SZ10 CARB STEEL (BLADE) ×4 IMPLANT
BNDG ELASTIC 6X5.8 VLCR STR LF (GAUZE/BANDAGES/DRESSINGS) ×2 IMPLANT
BOWL SMART MIX CTS (DISPOSABLE) ×2 IMPLANT
BSPLAT TIB 4 CMNT ROT PLAT STR (Knees) ×1 IMPLANT
CEMENT HV SMART SET (Cement) ×4 IMPLANT
COVER WAND RF STERILE (DRAPES) IMPLANT
CUFF TOURN SGL QUICK 34 (TOURNIQUET CUFF) ×2
CUFF TRNQT CYL 34X4.125X (TOURNIQUET CUFF) ×1 IMPLANT
DECANTER SPIKE VIAL GLASS SM (MISCELLANEOUS) ×4 IMPLANT
DERMABOND ADVANCED (GAUZE/BANDAGES/DRESSINGS) ×1
DERMABOND ADVANCED .7 DNX12 (GAUZE/BANDAGES/DRESSINGS) ×1 IMPLANT
DRAPE U-SHAPE 47X51 STRL (DRAPES) ×2 IMPLANT
DRESSING AQUACEL AG SP 3.5X10 (GAUZE/BANDAGES/DRESSINGS) ×1 IMPLANT
DRSG AQUACEL AG SP 3.5X10 (GAUZE/BANDAGES/DRESSINGS) ×2
DURAPREP 26ML APPLICATOR (WOUND CARE) ×4 IMPLANT
ELECT REM PT RETURN 15FT ADLT (MISCELLANEOUS) ×2 IMPLANT
GLOVE ECLIPSE 8.0 STRL XLNG CF (GLOVE) ×2 IMPLANT
GLOVE ORTHO TXT STRL SZ7.5 (GLOVE) ×2 IMPLANT
GLOVE SURG ENC MOIS LTX SZ6 (GLOVE) IMPLANT
GLOVE SURG UNDER POLY LF SZ6.5 (GLOVE) IMPLANT
GLOVE SURG UNDER POLY LF SZ7.5 (GLOVE) ×2 IMPLANT
GLOVE SURG UNDER POLY LF SZ8.5 (GLOVE) IMPLANT
GOWN STRL REUS W/TWL 2XL LVL3 (GOWN DISPOSABLE) IMPLANT
GOWN STRL REUS W/TWL LRG LVL3 (GOWN DISPOSABLE) ×2 IMPLANT
HANDPIECE INTERPULSE COAX TIP (DISPOSABLE) ×2
HOLDER FOLEY CATH W/STRAP (MISCELLANEOUS) IMPLANT
KIT TURNOVER KIT A (KITS) ×2 IMPLANT
MANIFOLD NEPTUNE II (INSTRUMENTS) ×2 IMPLANT
NDL SAFETY ECLIPSE 18X1.5 (NEEDLE) IMPLANT
NEEDLE HYPO 18GX1.5 SHARP (NEEDLE)
NS IRRIG 1000ML POUR BTL (IV SOLUTION) ×2 IMPLANT
PACK TOTAL KNEE CUSTOM (KITS) ×2 IMPLANT
PENCIL SMOKE EVACUATOR (MISCELLANEOUS) IMPLANT
PIN DRILL FIX HALF THREAD (BIT) ×2 IMPLANT
PIN FIX SIGMA LCS THRD HI (PIN) ×2 IMPLANT
PROTECTOR NERVE ULNAR (MISCELLANEOUS) ×2 IMPLANT
SET HNDPC FAN SPRY TIP SCT (DISPOSABLE) ×1 IMPLANT
SET PAD KNEE POSITIONER (MISCELLANEOUS) ×2 IMPLANT
SUT MNCRL AB 4-0 PS2 18 (SUTURE) ×2 IMPLANT
SUT STRATAFIX PDS+ 0 24IN (SUTURE) ×2 IMPLANT
SUT VIC AB 1 CT1 36 (SUTURE) ×2 IMPLANT
SUT VIC AB 2-0 CT1 27 (SUTURE) ×6
SUT VIC AB 2-0 CT1 TAPERPNT 27 (SUTURE) ×3 IMPLANT
SYR 3ML LL SCALE MARK (SYRINGE) ×2 IMPLANT
TRAY FOLEY MTR SLVR 16FR STAT (SET/KITS/TRAYS/PACK) ×2 IMPLANT
WATER STERILE IRR 1000ML POUR (IV SOLUTION) ×4 IMPLANT
WRAP KNEE MAXI GEL POST OP (GAUZE/BANDAGES/DRESSINGS) ×2 IMPLANT

## 2020-04-15 NOTE — Discharge Instructions (Signed)

## 2020-04-15 NOTE — Anesthesia Procedure Notes (Signed)
Anesthesia Regional Block: Adductor canal block   Pre-Anesthetic Checklist: ,, timeout performed, Correct Patient, Correct Site, Correct Laterality, Correct Procedure, Correct Position, site marked, Risks and benefits discussed,  Surgical consent,  Pre-op evaluation,  At surgeon's request and post-op pain management  Laterality: Right  Prep: chloraprep       Needles:  Injection technique: Single-shot  Needle Type: Echogenic Stimulator Needle     Needle Length: 9cm  Needle Gauge: 21   Needle insertion depth: 8 cm   Additional Needles:   Procedures:,,,, ultrasound used (permanent image in chart),,,,  Narrative:  Start time: 04/15/2020 7:45 AM End time: 04/15/2020 7:50 AM Injection made incrementally with aspirations every 5 mL.  Performed by: Personally  Anesthesiologist: Josephine Igo, MD  Additional Notes: Timeout performed. Patient sedated. Relevant anatomy ID'd using [place]. Incremental 2-31ml injection of LA with frequent aspiration. Patient tolerated procedure well.        Right Adductor Canal Block

## 2020-04-15 NOTE — Plan of Care (Signed)
  Problem: Education: Goal: Knowledge of General Education information will improve Description: Including pain rating scale, medication(s)/side effects and non-pharmacologic comfort measures Outcome: Progressing   Problem: Activity: Goal: Risk for activity intolerance will decrease Outcome: Progressing   Problem: Nutrition: Goal: Adequate nutrition will be maintained Outcome: Progressing   Problem: Elimination: Goal: Will not experience complications related to bowel motility Outcome: Progressing   Problem: Safety: Goal: Ability to remain free from injury will improve Outcome: Progressing   Problem: Education: Goal: Knowledge of the prescribed therapeutic regimen will improve Outcome: Progressing   Problem: Activity: Goal: Ability to avoid complications of mobility impairment will improve Outcome: Progressing   Problem: Pain Management: Goal: Pain level will decrease with appropriate interventions Outcome: Progressing

## 2020-04-15 NOTE — Progress Notes (Signed)
AssistedDr. Foster with right, ultrasound guided, adductor canal block. Side rails up, monitors on throughout procedure. See vital signs in flow sheet. Tolerated Procedure well.  

## 2020-04-15 NOTE — Evaluation (Addendum)
Physical Therapy Evaluation Patient Details Name: [name] MRN: [id number] DOB: 06-Jun-1955 Today's Date: 04/15/2020   History of Present Illness  patient is a 65 y.o. female s/p Rt TKA on 04/15/2020 with PMH significant for RA, HTN, HLD, GERD, fibromyalgia, depression, anxiety, CAD, aortic atherosclerosis, Lt TKA x2, and B shoulder replacements.  Clinical Impression  Pt is a 64y.o. female s/p Rt TKA POD 0. Pt reports that she is independent with mobility at baseline. Pt was unable to complete SLR 2/2 muscle weakness, suspect regional block. PT donned Rt knee immobilizer brace for safety when mobilizing. Pt required MIN assist and verbal cues with sit to stand transfers. Pt required manual Rt knee block for promotion of knee extension with pre gait marching and lateral stepping to prevent knee buckling. PT educated pt on increased WB through UEs on RW for safety when WB on Rt LE which decreased severity of Rt knee buckling. Pt required MIN assist- MIN guard for ambulation 45ft with verbal cues for RW management and step to gait pattern with no LOB. PT reviewed therapeutic interventions for promotion of DVT prevention, pt demonstrated understanding. Pt's husband will be available to assist at home. Pt will benefit from skilled PT to increase independence and safety with mobility. Acute therapy to follow up during stay.      Follow Up Recommendations Follow surgeon's recommendation for DC plan and follow-up therapies;Home health PT    Equipment Recommendations  None recommended by PT (pt owns RW)    Recommendations for Other Services       Precautions / Restrictions Precautions Precautions: Fall Required Braces or Orthoses: Knee Immobilizer - Right Knee Immobilizer - Right: On when out of bed or walking;Discontinue once straight leg raise with < 10 degree lag Restrictions Weight Bearing Restrictions: No Other Position/Activity Restrictions: WBAT      Mobility  Bed  Mobility Overal bed mobility: Needs Assistance Bed Mobility: Supine to Sit     Supine to sit: Supervision;HOB elevated     General bed mobility comments: pt with use of B UEs to scoot to EOB with supervision for safety    Transfers Overall transfer level: Needs assistance Equipment used: Rolling walker (2 wheeled) Transfers: Sit to/from Stand Sit to Stand: Min assist         General transfer comment: MIN assist for safety and steadying in standing with cues for safe hand placement.  Ambulation/Gait Ambulation/Gait assistance: Min assist;Min guard Gait Distance (Feet): 40 Feet Assistive device: Rolling walker (2 wheeled) Gait Pattern/deviations: Step-to pattern;Decreased stride length;Decreased weight shift to right Gait velocity: decr   General Gait Details: pt performed pre gait marching and lateral stepping and required manual Rt knee block from therapist to promote knee extension and prevent buckling of Rt knee. PT educated pt on increased WB through UEs on RW to to improve safety and decrease load through Rt LE as pt still expericing Rt LE weakness, suspect regional block. Pt was able use increased WB through UEs and demonstrated decreased knee buckling in WB on Rt LE.  MIN assist progressing to MIN guard for ambulation with cues for step to gait pattern and safe proximity to RW with no LOB.  Stairs            Wheelchair Mobility    Modified Rankin (Stroke Patients Only)       Balance Overall balance assessment: Needs assistance Sitting-balance support: Feet supported Sitting balance-Leahy Scale: Good     Standing balance support: Bilateral upper extremity supported;During functional  activity Standing balance-Leahy Scale: Poor Standing balance comment: use of RW to maintain standing balance                             Pertinent Vitals/Pain Pain Assessment: 0-10 Pain Score: 3  Pain Location: Rt knee Pain Descriptors / Indicators:  Sore;Discomfort Pain Intervention(s): Limited activity within patient's tolerance;Monitored during session;Repositioned    Home Living Family/patient expects to be discharged to:: Private residence Living Arrangements: Spouse/significant other Available Help at Discharge: Family Type of Home: House Home Access: Ramped entrance     Home Layout: One level Home Equipment: Environmental consultant - 2 wheels;Grab bars - tub/shower Additional Comments: pt's husband will be available to assist at home    Prior Function Level of Independence: Independent               Hand Dominance   Dominant Hand: Right    Extremity/Trunk Assessment   Upper Extremity Assessment Upper Extremity Assessment: Overall WFL for tasks assessed    Lower Extremity Assessment Lower Extremity Assessment: RLE deficits/detail RLE Deficits / Details: pt with fair quad set strength and 4/5 B dorsi/plantar flexion strength. Pt unable to complete SLR 2/2 muscle weakness, suspect regional block. 1/5 for quad strength. RLE Sensation: WNL RLE Coordination: decreased gross motor (2/2 spinal block)    Cervical / Trunk Assessment Cervical / Trunk Assessment: Normal  Communication   Communication: No difficulties  Cognition Arousal/Alertness: Awake/alert Behavior During Therapy: WFL for tasks assessed/performed Overall Cognitive Status: Within Functional Limits for tasks assessed                                        General Comments      Exercises Total Joint Exercises Ankle Circles/Pumps: AROM;Both;20 reps;Seated   Assessment/Plan    PT Assessment Patient needs continued PT services  PT Problem List Decreased strength;Decreased range of motion;Decreased activity tolerance;Decreased balance;Decreased mobility;Decreased coordination;Decreased knowledge of use of DME;Pain       PT Treatment Interventions DME instruction;Gait training;Functional mobility training;Therapeutic activities;Therapeutic  exercise;Balance training;Patient/family education    PT Goals (Current goals can be found in the Care Plan section)  Acute Rehab PT Goals Patient Stated Goal: none stated PT Goal Formulation: With patient Time For Goal Achievement: 04/22/20 Potential to Achieve Goals: Good    Frequency 7X/week   Barriers to discharge        Co-evaluation               AM-PAC PT "6 Clicks" Mobility  Outcome Measure Help needed turning from your back to your side while in a flat bed without using bedrails?: None Help needed moving from lying on your back to sitting on the side of a flat bed without using bedrails?: None Help needed moving to and from a bed to a chair (including a wheelchair)?: A Little Help needed standing up from a chair using your arms (e.g., wheelchair or bedside chair)?: A Little Help needed to walk in hospital room?: A Little Help needed climbing 3-5 steps with a railing? : A Lot 6 Click Score: 19    End of Session Equipment Utilized During Treatment: Gait belt;Right knee immobilizer Activity Tolerance: Patient tolerated treatment well Patient left: in chair;with call bell/phone within reach;with family/visitor present Nurse Communication: Mobility status (use of knee immobilizer for mobiilty and to remove at night) PT Visit Diagnosis: Unsteadiness on  feet (R26.81);Muscle weakness (generalized) (M62.81);Pain Pain - Right/Left: Right Pain - part of body: Knee    Time: 4098-1191 PT Time Calculation (min) (ACUTE ONLY): 24 min   Charges:   PT Evaluation $PT Eval Low Complexity: 1 Low PT Treatments $Gait Training: 8-22 mins        Rayford Williamsen, SPT  Acute rehab    Linday Rhodes 04/15/2020, 7:18 PM

## 2020-04-15 NOTE — Anesthesia Postprocedure Evaluation (Signed)
Anesthesia Post Note  Patient: [name]  Procedure(s) Performed: TOTAL KNEE ARTHROPLASTY (Right Knee)     Patient location during evaluation: PACU Anesthesia Type: Spinal Level of consciousness: oriented and awake and alert Pain management: pain level controlled Vital Signs Assessment: post-procedure vital signs reviewed and stable Respiratory status: spontaneous breathing, respiratory function stable and nonlabored ventilation Cardiovascular status: blood pressure returned to baseline and stable Postop Assessment: no headache, no backache, no apparent nausea or vomiting, spinal receding and patient able to bend at knees Anesthetic complications: no   No complications documented.  Last Vitals:  Vitals:   04/15/20 1105 04/15/20 1155  BP: 136/85 (!) 142/66  Pulse: (!) 55 60  Resp: 18 18  Temp: 36.5 C 36.5 C  SpO2: 100% 100%    Last Pain:  Vitals:   04/15/20 1155  TempSrc: Oral  PainSc:                  Jariel Drost A.

## 2020-04-15 NOTE — Care Plan (Signed)
Ortho Bundle Case Management Note  Patient Details  Name: [name] MRN: [id number] Date of Birth: Sep 04, 1955  R TKA on 04-15-20 DCP:  Home with spouse.  1 story home with ramp entrance. DME:  RW ordered through [employer].  Has a 3-in-1. PT:  [facility].  PT eval scheduled on 04-17-20.                   DME Arranged:  Gilford Rile rolling DME Agency:  [employer]  HH Arranged:  NA [place] Agency:  NA  Additional Comments: Please contact me with any questions of if this plan should need to change.  Marianne Sofia, RN,CCM [facility]  [phone] 04/15/2020, 3:06 PM

## 2020-04-15 NOTE — Transfer of Care (Signed)
Immediate Anesthesia Transfer of Care Note  Patient: [name]  Procedure(s) Performed: TOTAL KNEE ARTHROPLASTY (Right Knee)  Patient Location: PACU  Anesthesia Type:Spinal  Level of Consciousness: awake, alert , oriented and patient cooperative  Airway & Oxygen Therapy: Patient Spontanous Breathing and Patient connected to face mask  Post-op Assessment: Report given to RN and Post -op Vital signs reviewed and stable  Post vital signs: Reviewed and stable  Last Vitals:  Vitals Value Taken Time  BP    Temp    Pulse    Resp    SpO2      Last Pain:  Vitals:   04/15/20 0651  TempSrc:   PainSc: 5          Complications: No complications documented.

## 2020-04-15 NOTE — Anesthesia Procedure Notes (Signed)
Procedure Name: MAC Date/Time: 04/15/2020 8:22 AM Performed by: Claudia Desanctis, CRNA Pre-anesthesia Checklist: Patient identified, Emergency Drugs available, Suction available, Patient being monitored and Timeout performed Oxygen Delivery Method: Simple face mask

## 2020-04-15 NOTE — Interval H&P Note (Signed)
History and Physical Interval Note:  04/15/2020 8:26 AM  [name]  has presented today for surgery, with the diagnosis of Right knee osteoarthritis.  The various methods of treatment have been discussed with the patient and family. After consideration of risks, benefits and other options for treatment, the patient has consented to  Procedure(s) with comments: TOTAL KNEE ARTHROPLASTY (Right) - 70 mins as a surgical intervention.  The patient's history has been reviewed, patient examined, no change in status, stable for surgery.  I have reviewed the patient's chart and labs.  Questions were answered to the patient's satisfaction.     Mauri Pole

## 2020-04-15 NOTE — Anesthesia Procedure Notes (Signed)
Spinal  Patient location during procedure: OR Start time: 04/15/2020 8:16 AM End time: 04/15/2020 8:19 AM Staffing Performed: anesthesiologist  Anesthesiologist: Josephine Igo, MD Preanesthetic Checklist Completed: patient identified, IV checked, site marked, risks and benefits discussed, surgical consent, monitors and equipment checked, pre-op evaluation and timeout performed Spinal Block Patient position: sitting Prep: DuraPrep and site prepped and draped Patient monitoring: heart rate, cardiac monitor, continuous pulse ox and blood pressure Approach: midline Location: L3-4 Injection technique: single-shot Needle Needle type: Pencan  Needle gauge: 24 G Needle length: 9 cm Needle insertion depth: 5 cm Assessment Sensory level: T4 Additional Notes Patient tolerated procedure well. Adequate sensory level.

## 2020-04-15 NOTE — Op Note (Signed)
NAME:  [name]                      MEDICAL RECORD NO.:  [id number]                             FACILITY:  [facility]      PHYSICIAN:  Pietro Cassis. Alvan Dame, M.D.  DATE OF BIRTH:  August 14, 1955      DATE OF PROCEDURE:  04/15/2020                                     OPERATIVE REPORT         PREOPERATIVE DIAGNOSIS:  Right knee osteoarthritis.      POSTOPERATIVE DIAGNOSIS:  Right knee osteoarthritis.      FINDINGS:  The patient was noted to have complete loss of cartilage and   bone-on-bone arthritis with associated osteophytes in the lateral and patellofemoral compartments of   the knee with a significant synovitis and associated effusion.  The patient had failed months of conservative treatment including medications, injection therapy, activity modification.     PROCEDURE:  Right total knee replacement.      COMPONENTS USED:  DePuy Attune rotating platform posterior stabilized knee   system, a size 5N femur, 4 tibia, size 7 mm PS AOX insert, and 32 anatomic patellar   button.      SURGEON:  Pietro Cassis. Alvan Dame, M.D.      ASSISTANT:  Danae Orleans, PA-C.      ANESTHESIA:  Regional and Spinal.      SPECIMENS:  None.      COMPLICATION:  None.      DRAINS:  None.  EBL: <100 cc      TOURNIQUET TIME:   Total Tourniquet Time Documented: Thigh (Left) - 25 minutes Total: Thigh (Left) - 25 minutes  .      The patient was stable to the recovery room.      INDICATION FOR PROCEDURE:  [name] is a 65 y.o. female patient of   mine.  The patient had been seen, evaluated, and treated for months conservatively in the   office with medication, activity modification, and injections.  The patient had   radiographic changes of bone-on-bone arthritis with endplate sclerosis and osteophytes noted.  Based on the radiographic changes and failed conservative measures, the patient   decided to proceed with definitive treatment, total knee replacement.  Risks of infection, DVT, component  failure, need for revision surgery, neurovascular injury were reviewed in the office setting.  The postop course was reviewed stressing the efforts to maximize post-operative satisfaction and function.  Consent was obtained for benefit of pain   relief.      PROCEDURE IN DETAIL:  The patient was brought to the operative theater.   Once adequate anesthesia, preoperative antibiotics, 1 gm of Vancomycin, weight dose based of Gentamycin,1 gm of Tranexamic Acid, and 10 mg of Decadron administered, the patient was positioned supine with a right thigh tourniquet placed.  The  right lower extremity was prepped and draped in sterile fashion.  A time-   out was performed identifying the patient, planned procedure, and the appropriate extremity.      The right lower extremity was placed in the [facility] leg holder.  The leg was   exsanguinated, tourniquet elevated to 250 mmHg.  A  midline incision was   made followed by median parapatellar arthrotomy.  Following initial   exposure, attention was first directed to the patella.  Precut   measurement was noted to be 22 mm.  I resected down to 15 mm and used a   32 anatomic patellar button to restore patellar height as well as cover the cut surface.      The lug holes were drilled and a metal shim was placed to protect the   patella from retractors and saw blade during the procedure.      At this point, attention was now directed to the femur.  The femoral   canal was opened with a drill, irrigated to try to prevent fat emboli.  An   intramedullary rod was passed at 3 degrees valgus, 8 mm of bone was   resected off the distal femur.  Following this resection, the tibia was   subluxated anteriorly.  Using the extramedullary guide, 2 mm of bone was resected off   the proximal medial tibia.  We confirmed the gap would be   stable medially and laterally with a size 5 spacer block as well as confirmed that the tibial cut was perpendicular in the coronal plane,  checking with an alignment rod.      Once this was done, I sized the femur to be a size 5 in the anterior-   posterior dimension, chose a narrow component based on medial and   lateral dimension.  The size 5 rotation block was then pinned in   position anterior referenced using the C-clamp to set rotation.  The   anterior, posterior, and  chamfer cuts were made without difficulty nor   notching making certain that I was along the anterior cortex to help   with flexion gap stability.      The final box cut was made off the lateral aspect of distal femur.      At this point, the tibia was sized to be a size 4.  The size 4 tray was   then pinned in position through the medial third of the tubercle,   drilled, and keel punched.  Trial reduction was now carried with a 5 femur,  4 tibia, a size 7 mm PS insert, and the 32 anatomic patella botton.  The knee was brought to full extension with good flexion stability with the patella   tracking through the trochlea without application of pressure.  Given   all these findings the trial components removed.  Final components were   opened and cement was mixed.  The knee was irrigated with normal saline solution and pulse lavage.  The synovial lining was   then injected with 30 cc of 0.25% Marcaine with epinephrine, 1 cc of Toradol and 30 cc of NS for a total of 61 cc.     Final implants were then cemented onto cleaned and dried cut surfaces of bone with the knee brought to extension with a size 7 mm PS trial insert.      Once the cement had fully cured, excess cement was removed   throughout the knee.  I confirmed that I was satisfied with the range of   motion and stability, and the final size 7 mm PS AOX insert was chosen.  It was   placed into the knee.      The tourniquet had been let down at 25 minutes.  No significant   hemostasis was required.  The extensor mechanism was  then reapproximated using #1 Vicryl and #1 Stratafix sutures with the knee    in flexion.  The   remaining wound was closed with 2-0 Vicryl and running 4-0 Monocryl.   The knee was cleaned, dried, dressed sterilely using Dermabond and   Aquacel dressing.  The patient was then   brought to recovery room in stable condition, tolerating the procedure   well.   Please note that Physician Assistant, Danae Orleans, PA-C was present for the entirety of the case, and was utilized for pre-operative positioning, peri-operative retractor management, general facilitation of the procedure and for primary wound closure at the end of the case.              Pietro Cassis Alvan Dame, M.D.    04/15/2020 9:31 AM

## 2020-04-16 DIAGNOSIS — M1711 Unilateral primary osteoarthritis, right knee: Secondary | ICD-10-CM | POA: Diagnosis not present

## 2020-04-16 DIAGNOSIS — Z87891 Personal history of nicotine dependence: Secondary | ICD-10-CM | POA: Diagnosis not present

## 2020-04-16 DIAGNOSIS — E669 Obesity, unspecified: Secondary | ICD-10-CM | POA: Diagnosis present

## 2020-04-16 DIAGNOSIS — Z791 Long term (current) use of non-steroidal anti-inflammatories (NSAID): Secondary | ICD-10-CM | POA: Diagnosis not present

## 2020-04-16 DIAGNOSIS — G2581 Restless legs syndrome: Secondary | ICD-10-CM | POA: Diagnosis not present

## 2020-04-16 DIAGNOSIS — K219 Gastro-esophageal reflux disease without esophagitis: Secondary | ICD-10-CM | POA: Diagnosis not present

## 2020-04-16 DIAGNOSIS — D649 Anemia, unspecified: Secondary | ICD-10-CM | POA: Diagnosis not present

## 2020-04-16 DIAGNOSIS — Z79899 Other long term (current) drug therapy: Secondary | ICD-10-CM | POA: Diagnosis not present

## 2020-04-16 DIAGNOSIS — E785 Hyperlipidemia, unspecified: Secondary | ICD-10-CM | POA: Diagnosis not present

## 2020-04-16 DIAGNOSIS — I10 Essential (primary) hypertension: Secondary | ICD-10-CM | POA: Diagnosis not present

## 2020-04-16 LAB — BASIC METABOLIC PANEL
Anion gap: 9 (ref 5–15)
BUN: 12 mg/dL (ref 8–23)
CO2: 22 mmol/L (ref 22–32)
Calcium: 8.8 mg/dL — ABNORMAL LOW (ref 8.9–10.3)
Chloride: 106 mmol/L (ref 98–111)
Creatinine, Ser: 0.52 mg/dL (ref 0.44–1.00)
GFR, Estimated: >60 mL/min (ref >60)
Glucose, Bld: 133 mg/dL — ABNORMAL HIGH (ref 70–99)
Potassium: 3.8 mmol/L (ref 3.5–5.1)
Sodium: 137 mmol/L (ref 135–145)

## 2020-04-16 LAB — CBC
HCT: 33.7 % — ABNORMAL LOW (ref 36.0–46.0)
Hemoglobin: 10.8 g/dL — ABNORMAL LOW (ref 12.0–15.0)
MCH: 29.7 pg (ref 26.0–34.0)
MCHC: 32 g/dL (ref 30.0–36.0)
MCV: 92.6 fL (ref 80.0–100.0)
Platelets: 160 10*3/uL (ref 150–400)
RBC: 3.64 MIL/uL — ABNORMAL LOW (ref 3.87–5.11)
RDW: 14 % (ref 11.5–15.5)
WBC: 12.1 10*3/uL — ABNORMAL HIGH (ref 4.0–10.5)
nRBC: 0 % (ref 0.0–0.2)

## 2020-04-16 MED ORDER — ASPIRIN 81 MG PO CHEW: 81.0000 mg | CHEWABLE_TABLET | Freq: Two times a day (BID) | ORAL | 0 refills | Status: AC

## 2020-04-16 MED ORDER — FERROUS SULFATE 325 (65 FE) MG PO TABS
325.0000 mg | ORAL_TABLET | Freq: Three times a day (TID) | ORAL | 0 refills | Status: DC
Start: 2020-04-16 — End: 2020-07-30

## 2020-04-16 MED ORDER — HYDROCODONE-ACETAMINOPHEN 7.5-325 MG PO TABS
1.0000 | ORAL_TABLET | ORAL | 0 refills | Status: DC | PRN
Start: 2020-04-16 — End: 2020-07-30

## 2020-04-16 MED ORDER — CELECOXIB 200 MG PO CAPS: 200.0000 mg | ORAL_CAPSULE | Freq: Two times a day (BID) | ORAL | 0 refills | Status: DC

## 2020-04-16 MED ORDER — METHOCARBAMOL 500 MG PO TABS: 500.0000 mg | ORAL_TABLET | Freq: Four times a day (QID) | ORAL | 0 refills | Status: DC | PRN

## 2020-04-16 MED ORDER — DOCUSATE SODIUM 100 MG PO CAPS: 100.0000 mg | ORAL_CAPSULE | Freq: Two times a day (BID) | ORAL | 0 refills | Status: DC

## 2020-04-16 MED ORDER — POLYETHYLENE GLYCOL 3350 17 G PO PACK: 17.0000 g | PACK | Freq: Two times a day (BID) | ORAL | 0 refills | Status: DC

## 2020-04-16 NOTE — Progress Notes (Signed)
     Subjective: 1 Day Post-Op Procedure(s) (LRB): TOTAL KNEE ARTHROPLASTY (Right)   Seen by Dr. Alvan Dame. Patient reports pain as mild, pain controlled. No reported events throughout the night.  Dr. Alvan Dame discussed the procedure, findings and expectations moving forward.  Ready to be discharged home, if they do well with PT.  Follow up in the clinic in 2 weeks.  Knows to call with any questions or concerns.     Patient's anticipated LOS is less than 2 midnights, meeting these requirements: - Younger than 40 - Lives within 1 hour of care - Has a competent adult at home to recover with post-op recover - NO history of  - Chronic pain requiring opiods  - Diabetes  - Coronary Artery Disease  - Heart failure  - Heart attack  - Stroke  - DVT/VTE  - Cardiac arrhythmia  - Respiratory Failure/COPD  - Renal failure  - Anemia  - Advanced Liver disease       Objective:   VITALS:   Vitals:   04/16/20 0140 04/16/20 0535  BP: (!) 134/57 137/71  Pulse: (!) 57 (!) 55  Resp: 16 16  Temp: 98.3 F (36.8 C) 97.8 F (36.6 C)  SpO2: 97% 96%    Dorsiflexion/Plantar flexion intact Incision: dressing C/D/I No cellulitis present Compartment soft  LABS Recent Labs    04/16/20 0332  HGB 10.8*  HCT 33.7*  WBC 12.1*  PLT 160    Recent Labs    04/16/20 0332  NA 137  K 3.8  BUN 12  CREATININE 0.52  GLUCOSE 133*     Assessment/Plan: 1 Day Post-Op Procedure(s) (LRB): TOTAL KNEE ARTHROPLASTY (Right) Foley cath d/c'ed Advance diet Up with therapy D/C IV fluids Discharge home Follow up in 2 weeks at [facility] Follow up with OLIN,Tameka Hoiland D in 2 weeks.  Contact information:  [facility] [address], [address] [phone]    Obese (BMI 30-39.9) Estimated body mass index is 36.63 kg/m as calculated from the following:   Height as of this encounter: 5\' 4"  (1.626 m).   Weight as of this encounter: 96.8 kg. Patient also counseled  that weight may inhibit the healing process Patient counseled that losing weight will help with future health issues        Danae Orleans PA-C  [facility]  [place] [address], [address], [place], [place] [address] Phone: [phone] [email or web address] Facebook  [employer]

## 2020-04-16 NOTE — Progress Notes (Signed)
Physical Therapy Treatment Patient Details Name: [name] MRN: [id number] DOB: [birth year] Today's Date: 04/16/2020    History of Present Illness patient is a 65 y.o. female s/p Rt TKA on 04/15/2020 with PMH significant for RA, HTN, HLD, GERD, fibromyalgia, depression, anxiety, CAD, aortic atherosclerosis, Lt TKA x2, and B shoulder replacements.    PT Comments    Pt reporting more pain this afternoon and premedicated with IV pain meds and nausea meds.  However, pt remains limited with mobility due to pain and nausea.  Pt does not appear ready for d/c home today.  RN notified.   Follow Up Recommendations  Follow surgeon's recommendation for DC plan and follow-up therapies;Home health PT     Equipment Recommendations  None recommended by PT    Recommendations for Other Services       Precautions / Restrictions Precautions Precautions: Fall;Knee Precaution Comments: able to perform SLR Required Braces or Orthoses: Knee Immobilizer - Right Restrictions Other Position/Activity Restrictions: WBAT    Mobility  Bed Mobility Overal bed mobility: Needs Assistance Bed Mobility: Supine to Sit;Sit to Supine     Supine to sit: Min assist Sit to supine: Min assist   General bed mobility comments: assist for R LE due to pain    Transfers Overall transfer level: Needs assistance Equipment used: Rolling walker (2 wheeled) Transfers: Sit to/from Stand Sit to Stand: Min assist         General transfer comment: verbal cues for UE and LE positioning, assist for positioning R LE due to pain  Ambulation/Gait Ambulation/Gait assistance: Min guard Gait Distance (Feet): 12 Feet (x2) Assistive device: Rolling walker (2 wheeled) Gait Pattern/deviations: Step-to pattern;Decreased stance time - right;Antalgic Gait velocity: decr   General Gait Details: verbal cues for sequence, RW positioning, posture; pt only able to ambulate to/from bathroom in room due to pain and  nausea   Stairs             Wheelchair Mobility    Modified Rankin (Stroke Patients Only)       Balance                                            Cognition Arousal/Alertness: Awake/alert Behavior During Therapy: WFL for tasks assessed/performed Overall Cognitive Status: Within Functional Limits for tasks assessed                                        Exercises      General Comments        Pertinent Vitals/Pain Pain Assessment: 0-10 Pain Score: 4  Pain Location: Rt knee Pain Descriptors / Indicators: Sore;Discomfort;Grimacing;Guarding Pain Intervention(s): Premedicated before session;Repositioned;Monitored during session;Ice applied    Home Living                      Prior Function            PT Goals (current goals can now be found in the care plan section) Progress towards PT goals: Progressing toward goals    Frequency    7X/week      PT Plan Current plan remains appropriate    Co-evaluation              AM-PAC PT "6 Clicks" Mobility   Outcome Measure  Help needed turning from your back to your side while in a flat bed without using bedrails?: None Help needed moving from lying on your back to sitting on the side of a flat bed without using bedrails?: A Little Help needed moving to and from a bed to a chair (including a wheelchair)?: A Little Help needed standing up from a chair using your arms (e.g., wheelchair or bedside chair)?: A Little Help needed to walk in hospital room?: A Little Help needed climbing 3-5 steps with a railing? : A Little 6 Click Score: 19    End of Session Equipment Utilized During Treatment: Gait belt Activity Tolerance: Patient tolerated treatment well Patient left: with call bell/phone within reach;with family/visitor present;in bed;with bed alarm set   PT Visit Diagnosis: Unsteadiness on feet (R26.81);Muscle weakness (generalized) (M62.81)     Time:  1941-7408 PT Time Calculation (min) (ACUTE ONLY): 12 min  Charges:  $Gait Training: 8-22 mins                     Jannette Spanner PT, DPT Acute Rehabilitation Services Pager: [phone] Office: [phone]  Trena Platt 04/16/2020, 3:38 PM

## 2020-04-16 NOTE — TOC Transition Note (Signed)
Transition of Care [facility]) - CM/SW Discharge Note   Patient Details  Name: [name] MRN: [id number] Date of Birth: 1955/06/12  [facility] of [facility]) CM/SW Contact:  Lennart Pall, LCSW Phone Number: 04/16/2020, 10:38 AM   Clinical Narrative:    Met briefly with pt and confirmed she has received 3n1 commode via [facility] (did not need rw).  Plan for OPPT at [facility] in [place].  No further TOC needs.   Final next level of care: OP Rehab Barriers to Discharge: No Barriers Identified   Patient Goals and CMS Choice Patient states their goals for this hospitalization and ongoing recovery are:: go home      Discharge Placement                       Discharge Plan and Services                DME Arranged: Walker rolling DME Agency: [employer]       HH Arranged: NA [place] Agency: NA        Social Determinants of Health (SDOH) Interventions     Readmission Risk Interventions No flowsheet data found.

## 2020-04-16 NOTE — Progress Notes (Signed)
Physical Therapy Treatment Patient Details Name: [name] MRN: [id number] DOB: 11/01/1955 Today's Date: 04/16/2020    History of Present Illness patient is a 65 y.o. female s/p Rt TKA on 04/15/2020 with PMH significant for RA, HTN, HLD, GERD, fibromyalgia, depression, anxiety, CAD, aortic atherosclerosis, Lt TKA x2, and B shoulder replacements.    PT Comments    Pt ambulated in hallway and assisted to bathroom.  Pt anticipates d/c home later today.   Follow Up Recommendations  Follow surgeon's recommendation for DC plan and follow-up therapies;Home health PT     Equipment Recommendations  None recommended by PT    Recommendations for Other Services       Precautions / Restrictions Precautions Precautions: Fall Precaution Comments: able to perform SLR Required Braces or Orthoses: Knee Immobilizer - Right Restrictions Other Position/Activity Restrictions: WBAT    Mobility  Bed Mobility Overal bed mobility: Needs Assistance Bed Mobility: Supine to Sit     Supine to sit: Supervision;HOB elevated          Transfers Overall transfer level: Needs assistance Equipment used: Rolling walker (2 wheeled) Transfers: Sit to/from Stand Sit to Stand: Min guard         General transfer comment: verbal cues for UE and LE positioning  Ambulation/Gait Ambulation/Gait assistance: Min guard Gait Distance (Feet): 200 Feet Assistive device: Rolling walker (2 wheeled) Gait Pattern/deviations: Step-to pattern;Decreased stance time - right;Antalgic Gait velocity: decr   General Gait Details: verbal cues for sequence, RW positioning, posture, slow pace   Stairs             Wheelchair Mobility    Modified Rankin (Stroke Patients Only)       Balance                                            Cognition Arousal/Alertness: Awake/alert Behavior During Therapy: WFL for tasks assessed/performed Overall Cognitive Status: Within Functional  Limits for tasks assessed                                        Exercises      General Comments        Pertinent Vitals/Pain Pain Assessment: 0-10 Pain Score: 5  Pain Location: Rt knee Pain Descriptors / Indicators: Sore;Discomfort Pain Intervention(s): Repositioned;Monitored during session    Home Living                      Prior Function            PT Goals (current goals can now be found in the care plan section) Progress towards PT goals: Progressing toward goals    Frequency    7X/week      PT Plan Current plan remains appropriate    Co-evaluation              AM-PAC PT "6 Clicks" Mobility   Outcome Measure  Help needed turning from your back to your side while in a flat bed without using bedrails?: None Help needed moving from lying on your back to sitting on the side of a flat bed without using bedrails?: None Help needed moving to and from a bed to a chair (including a wheelchair)?: A Little Help needed standing up from a chair using  your arms (e.g., wheelchair or bedside chair)?: A Little Help needed to walk in hospital room?: A Little Help needed climbing 3-5 steps with a railing? : A Little 6 Click Score: 20    End of Session Equipment Utilized During Treatment: Gait belt Activity Tolerance: Patient tolerated treatment well Patient left: in chair;with call bell/phone within reach;with family/visitor present;with chair alarm set   PT Visit Diagnosis: Unsteadiness on feet (R26.81);Muscle weakness (generalized) (M62.81)     Time: 4171-2787 PT Time Calculation (min) (ACUTE ONLY): 25 min  Charges:  $Gait Training: 23-37 mins                     Jannette Spanner PT, DPT Acute Rehabilitation Services Pager: [phone] Office: [phone]   York Ram E 04/16/2020, 1:23 PM

## 2020-04-17 ENCOUNTER — Encounter (HOSPITAL_COMMUNITY): Payer: Self-pay | Admitting: Orthopedic Surgery

## 2020-04-17 ENCOUNTER — Ambulatory Visit: Payer: Medicare Other | Admitting: Physical Therapy

## 2020-04-17 DIAGNOSIS — Z87891 Personal history of nicotine dependence: Secondary | ICD-10-CM | POA: Diagnosis not present

## 2020-04-17 DIAGNOSIS — D649 Anemia, unspecified: Secondary | ICD-10-CM | POA: Diagnosis not present

## 2020-04-17 DIAGNOSIS — Z79899 Other long term (current) drug therapy: Secondary | ICD-10-CM | POA: Diagnosis not present

## 2020-04-17 DIAGNOSIS — G2581 Restless legs syndrome: Secondary | ICD-10-CM | POA: Diagnosis not present

## 2020-04-17 DIAGNOSIS — M1711 Unilateral primary osteoarthritis, right knee: Secondary | ICD-10-CM | POA: Diagnosis not present

## 2020-04-17 DIAGNOSIS — E785 Hyperlipidemia, unspecified: Secondary | ICD-10-CM | POA: Diagnosis not present

## 2020-04-17 DIAGNOSIS — Z791 Long term (current) use of non-steroidal anti-inflammatories (NSAID): Secondary | ICD-10-CM | POA: Diagnosis not present

## 2020-04-17 DIAGNOSIS — K219 Gastro-esophageal reflux disease without esophagitis: Secondary | ICD-10-CM | POA: Diagnosis not present

## 2020-04-17 DIAGNOSIS — I10 Essential (primary) hypertension: Secondary | ICD-10-CM | POA: Diagnosis not present

## 2020-04-17 LAB — BASIC METABOLIC PANEL
Anion gap: 11 (ref 5–15)
BUN: 15 mg/dL (ref 8–23)
CO2: 22 mmol/L (ref 22–32)
Calcium: 8.8 mg/dL — ABNORMAL LOW (ref 8.9–10.3)
Chloride: 108 mmol/L (ref 98–111)
Creatinine, Ser: 0.55 mg/dL (ref 0.44–1.00)
GFR, Estimated: >60 mL/min (ref >60)
Glucose, Bld: 123 mg/dL — ABNORMAL HIGH (ref 70–99)
Potassium: 4.3 mmol/L (ref 3.5–5.1)
Sodium: 141 mmol/L (ref 135–145)

## 2020-04-17 LAB — CBC
HCT: 31.9 % — ABNORMAL LOW (ref 36.0–46.0)
Hemoglobin: 9.9 g/dL — ABNORMAL LOW (ref 12.0–15.0)
MCH: 30 pg (ref 26.0–34.0)
MCHC: 31 g/dL (ref 30.0–36.0)
MCV: 96.7 fL (ref 80.0–100.0)
Platelets: 261 10*3/uL (ref 150–400)
RBC: 3.3 MIL/uL — ABNORMAL LOW (ref 3.87–5.11)
RDW: 14 % (ref 11.5–15.5)
WBC: 10.3 10*3/uL (ref 4.0–10.5)
nRBC: 0 % (ref 0.0–0.2)

## 2020-04-17 MED ORDER — ONDANSETRON HCL 4 MG PO TABS: 4.0000 mg | ORAL_TABLET | Freq: Three times a day (TID) | ORAL | 1 refills | Status: AC | PRN

## 2020-04-17 NOTE — Progress Notes (Signed)
Physical Therapy Treatment Patient Details Name: [name] MRN: [id number] DOB: 1955/10/12 Today's Date: 04/17/2020    History of Present Illness patient is a 65 y.o. female s/p Rt TKA on 04/15/2020 with PMH significant for RA, HTN, HLD, GERD, fibromyalgia, depression, anxiety, CAD, aortic atherosclerosis, Lt TKA x2, and B shoulder replacements.    PT Comments    Pt assisted to bathroom and then ambulated in hallway.  Pt reports pain still present today however more tolerable.  Pt would like to d/c home and feels ready.  Pt aware to maintain knee extension at rest and also discussed sleeping positions (pt reports not sleeping last night).  Pt and spouse feel pt is ready to d/c home today.    Follow Up Recommendations  Follow surgeon's recommendation for DC plan and follow-up therapies;Home health PT     Equipment Recommendations  None recommended by PT    Recommendations for Other Services       Precautions / Restrictions Precautions Precautions: Fall;Knee Restrictions Other Position/Activity Restrictions: WBAT    Mobility  Bed Mobility Overal bed mobility: Needs Assistance Bed Mobility: Supine to Sit     Supine to sit: Supervision          Transfers Overall transfer level: Needs assistance Equipment used: Rolling walker (2 wheeled) Transfers: Sit to/from Stand Sit to Stand: Min guard         General transfer comment: verbal cues for UE and LE positioning  Ambulation/Gait Ambulation/Gait assistance: Min guard Gait Distance (Feet): 80 Feet Assistive device: Rolling walker (2 wheeled) Gait Pattern/deviations: Step-to pattern;Decreased stance time - right;Antalgic Gait velocity: decr   General Gait Details: verbal cues for sequence, RW positioning, posture; pt ambulated to bathroom and then 80 feet in hallway, reports pain 4/10 however more tolerable today   Stairs             Wheelchair Mobility    Modified Rankin (Stroke Patients  Only)       Balance                                            Cognition Arousal/Alertness: Awake/alert Behavior During Therapy: WFL for tasks assessed/performed Overall Cognitive Status: Within Functional Limits for tasks assessed                                        Exercises      General Comments        Pertinent Vitals/Pain Pain Assessment: 0-10 Pain Score: 4  Pain Location: Rt knee Pain Descriptors / Indicators: Sore;Discomfort;Grimacing Pain Intervention(s): Repositioned;Monitored during session;Premedicated before session    Home Living                      Prior Function            PT Goals (current goals can now be found in the care plan section) Progress towards PT goals: Progressing toward goals    Frequency    7X/week      PT Plan Current plan remains appropriate    Co-evaluation              AM-PAC PT "6 Clicks" Mobility   Outcome Measure  Help needed turning from your back to your side while in a flat bed  without using bedrails?: None Help needed moving from lying on your back to sitting on the side of a flat bed without using bedrails?: A Little Help needed moving to and from a bed to a chair (including a wheelchair)?: A Little Help needed standing up from a chair using your arms (e.g., wheelchair or bedside chair)?: A Little Help needed to walk in hospital room?: A Little Help needed climbing 3-5 steps with a railing? : A Little 6 Click Score: 19    End of Session Equipment Utilized During Treatment: Gait belt Activity Tolerance: Patient tolerated treatment well Patient left: with call bell/phone within reach;with family/visitor present;in chair Nurse Communication: Mobility status PT Visit Diagnosis: Muscle weakness (generalized) (M62.81);Difficulty in walking, not elsewhere classified (R26.2)     Time: 0177-9390 PT Time Calculation (min) (ACUTE ONLY): 19 min  Charges:  $Gait  Training: 8-22 mins                    Jannette Spanner PT, DPT Acute Rehabilitation Services Pager: [phone] Office: [phone]  Trena Platt 04/17/2020, 4:13 PM

## 2020-04-17 NOTE — Progress Notes (Signed)
Patient ID: [name], female   DOB: 1955/12/01, 65 y.o.   MRN: [id number] Subjective: 2 Days Post-Op Procedure(s) (LRB): TOTAL KNEE ARTHROPLASTY (Right)    Patient reports pain as moderate. Patient required additional night stay due to pain and post op nausea.  She states that this am she is feeling better. Had been able to participate in therapy yesterday  Objective:   VITALS:   Vitals:   04/16/20 2149 04/17/20 0440  BP: (!) 156/75 (!) 159/72  Pulse: 60 62  Resp: 18 19  Temp: 98.1 F (36.7 C) 98.1 F (36.7 C)  SpO2: 100% 97%    Neurovascular intact Incision: dressing C/D/I  LABS Recent Labs    04/16/20 0332 04/17/20 0316  HGB 10.8* 9.9*  HCT 33.7* 31.9*  WBC 12.1* 10.3  PLT 160 261    Recent Labs    04/16/20 0332 04/17/20 0316  NA 137 141  K 3.8 4.3  BUN 12 15  CREATININE 0.52 0.55  GLUCOSE 133* 123*    No results for input(s): LABPT, INR in the last 72 hours.   Assessment/Plan: 2 Days Post-Op Procedure(s) (LRB): TOTAL KNEE ARTHROPLASTY (Right)   Up with therapy  Home today after therapy Will add Zofran for nausea to discharge medication RTC in 2 weeks

## 2020-04-21 ENCOUNTER — Telehealth: Payer: Self-pay | Admitting: Physical Therapy

## 2020-04-21 ENCOUNTER — Ambulatory Visit: Payer: Medicare Other | Admitting: Physical Therapy

## 2020-04-21 NOTE — Telephone Encounter (Signed)
Pt did not want to come today, she is in too much pain r/s for tomorrow

## 2020-04-22 ENCOUNTER — Ambulatory Visit: Payer: Medicare Other | Attending: Orthopedic Surgery | Admitting: Physical Therapy

## 2020-04-22 ENCOUNTER — Other Ambulatory Visit: Payer: Self-pay

## 2020-04-22 DIAGNOSIS — R600 Localized edema: Secondary | ICD-10-CM | POA: Insufficient documentation

## 2020-04-22 DIAGNOSIS — M6281 Muscle weakness (generalized): Secondary | ICD-10-CM | POA: Diagnosis not present

## 2020-04-22 DIAGNOSIS — G8929 Other chronic pain: Secondary | ICD-10-CM

## 2020-04-22 DIAGNOSIS — M25561 Pain in right knee: Secondary | ICD-10-CM | POA: Insufficient documentation

## 2020-04-22 DIAGNOSIS — M25661 Stiffness of right knee, not elsewhere classified: Secondary | ICD-10-CM

## 2020-04-22 NOTE — Therapy (Deleted)
[facility] [place], [place], [address] Phone: [phone]   Fax:  [phone]  Physical Therapy Treatment  Patient Details  Name: [name] MRN: [id number] Date of Birth: 1955-05-09 Referring Provider (PT): Paralee Cancel MD   Encounter Date: 04/22/2020   PT End of Session - 04/22/20 1528    Visit Number 1    Number of Visits 12    Date for PT Re-Evaluation 05/20/20    Authorization Type FOTO AT LEAST EVERY 5TH VISIT.  PROGRESS NOTE AT 10TH VISIT.  KX MODIFIER AFTER 15 VISITS.    PT Start Time 0312    PT Stop Time 0347    PT Time Calculation (min) 35 min    Activity Tolerance Patient tolerated treatment well    Behavior During Therapy [facility] for tasks assessed/performed           Past Medical History:  Diagnosis Date  . ANGIOMA 10/03/2008   REMOVED FROM RIGHT LOWER LEG-BENIGN  . Anxiety   . Aortic atherosclerosis ([place])   . Arthritis    RA  . Bronchitis    hx of   . Cancer (HCC)    BASAL CELL SKIN CANCER  . Complication of anesthesia   . Coronary artery disease   . Depression   . Fibromyalgia   . GERD 08/27/2008  . History of blood transfusion    1995  . History of hiatal hernia    hx of has had surgically repaired  . History of measles   . History of mumps   . History of nonmelanoma skin cancer   . HYPERLIPIDEMIA 08/27/2008  . HYPERTENSION 08/27/2008   pt is currently on no meds   . Pneumonia    hx of   . PONV (postoperative nausea and vomiting)   . Rheumatoid arthritis ([place])   . SEBORRHEIC KERATOSIS 08/27/2008  . Vocal cord paresis    hx of     Past Surgical History:  Procedure Laterality Date  . ABDOMINAL HYSTERECTOMY  1995  . APPENDECTOMY    . bilateral shoulder replacements     . BREAST BIOPSY Left   . BREAST DUCTAL SYSTEM EXCISION  08/16/2011   Procedure: EXCISION DUCTAL SYSTEM BREAST;  Surgeon: Joyice Faster. Cornett, MD;  Location: [facility];  Service: General;  Laterality:  Left;  left breast duct excision  . [place]   times 2; 1980 and 1982  . [place]  . CHOLECYSTECTOMY  1997  . EXCISION/RELEASE BURSA HIP Right 10/21/2014   Procedure: OPEN RIGHT HIP BURSECTOMY, EXOSECTOMY;  Surgeon: Paralee Cancel, MD;  Location: [facility];  Service: Orthopedics;  Laterality: Right;  . foot surgery  2004, 2005, 2008   bilat secondary to neuropathy   . JOINT REPLACEMENT  10,12   lt total knee X2  . KNEE ARTHROSCOPY  2007, 2008, 2009  . LAPAROSCOPIC GASTRIC SLEEVE RESECTION N/A 10/30/2013   Procedure: LAPAROSCOPIC GASTRIC SLEEVE RESECTION WITH HIATAL HERNIA REPAIR AND UPPER ENDOSCOPY;  Surgeon: Greer Pickerel, MD;  Location: [facility];  Service: General;  Laterality: N/A;  . NASAL SINUS SURGERY  2013  . OPEN SURGICAL REPAIR OF GLUTEAL TENDON  01/17/2012   Procedure: OPEN SURGICAL REPAIR OF GLUTEAL TENDON;  Surgeon: Mauri Pole, MD;  Location: [facility];  Service: Orthopedics;  Laterality: Right;  . TOTAL KNEE ARTHROPLASTY Right 04/15/2020   Procedure: TOTAL KNEE ARTHROPLASTY;  Surgeon: Paralee Cancel, MD;  Location: [facility];  Service: Orthopedics;  Laterality: Right;  70 mins    There were no vitals filed for this visit.   Subjective Assessment - 04/22/20 1531    Subjective COVID-19 screen performed prior to patient entering clinic.  The patient presents to the clinic today s/p right total knee replacement performed on 04/15/20.  She reports an 8/10 pain- level at rest  today.  She has been walking some with FWW at home.  Any movement causes severe pain.  Icing and keeping knee elevated helps decrease the pain somewhat.    Pertinent History Left TKA, RA, Fibromyalgia, HTN.    How long can you sit comfortably? One hour+.    How long can you stand comfortably? Less than 5 minutes.    How long can you walk comfortably? Between room sin home with a FWW.    Patient Stated Goals Get out of pain.    Pain Score 8     Pain Location Knee    Pain Orientation Right     Pain Descriptors / Indicators Aching;Throbbing;Sharp;Spasm;Shooting    Pain Type Surgical pain    Pain Onset More than a month ago    Pain Frequency Constant    Aggravating Factors  See above.    Pain Relieving Factors See above.              [facility] PT Assessment - 04/22/20 0001      Assessment   Medical Diagnosis right total knee replacement.    Referring Provider (PT) Paralee Cancel MD    Onset Date/Surgical Date 04/15/20   Surgery date:  04/15/20.     Precautions   Precaution Comments No ultrasound.      Restrictions   Weight Bearing Restrictions No      Balance Screen   Has the patient fallen in the past 6 months No    Has the patient had a decrease in activity level because of a fear of falling?  No    Is the patient reluctant to leave their home because of a fear of falling?  No      Home Environment   Living Environment Private residence      Prior Function   Level of Independence Independent      Observation/Other Assessments   Observations Aquacel intact over right anterior knee.    Focus on Therapeutic Outcomes (FOTO)  Complete.      Observation/Other Assessments-Edema    Edema --   Moderate right knee edema.     ROM / Strength   AROM / PROM / Strength PROM;Strength      PROM   Overall PROM Comments Gentle passive right knee extension to -12 degrees and 75 degrees of flexion.      Strength   Overall Strength Comments No active right hip or knee movement currently.      Palpation   Palpation comment Patient c/o diffuse right knee pain currently.      Ambulation/Gait   Gait Comments 10 feet walk with FWW.                         [facility] Adult PT Treatment/Exercise - 04/22/20 0001      Modalities   Modalities Vasopneumatic      Vasopneumatic   Number Minutes Vasopneumatic  15 minutes    Vasopnuematic Location  --   RT knee seated with wedge and two pillows under knee.  PT Long Term Goals - 04/22/20  1638      PT LONG TERM GOAL #1   Title Patient will be independent with HEP    Time 4    Status New      PT LONG TERM GOAL #2   Title Active right knee flexion to 110-115 degrees+ so the patient can perform functional tasks and do so with pain not > 2-3/10.    Time 4    Period Weeks    Status New      PT LONG TERM GOAL #3   Title Full right active knee extension in order to normalize gait.    Time 4    Period Weeks    Status New      PT LONG TERM GOAL #4   Title Increase right hip and knee strength to a solid 4+/5 to provide good stability for accomplishment of functional activities.    Time 4    Period Weeks    Status New      PT LONG TERM GOAL #5   Title Walk 200 feet with straight cane.    Time 4    Period Weeks    Status New                 Plan - 04/22/20 1627    Clinical Impression Statement The patient presents to the OPPT s/p right total knee replacement performed on 04/15/20.  She has been in a great deal of pain. She currently has no active right hip or knee movement currently.  She has moderate edema.  She has limited passive right knee range of motion.  She can walk a short distance her functional mobility is impaired. Patient will benefit from skilled physical therapy intervention to address pain and deficits.    Personal Factors and Comorbidities Comorbidity 1;Comorbidity 2;Other    Comorbidities Left TKA, RA, Fibromyalgia, HTN.    Examination-Activity Limitations Other;Locomotion Level    Examination-Participation Restrictions Other    Stability/Clinical Decision Making Stable/Uncomplicated    Clinical Decision Making Low    Rehab Potential Good    PT Treatment/Interventions ADLs/Self Care Home Management;Cryotherapy;Electrical Stimulation;Moist Heat;Neuromuscular re-education;Therapeutic exercise;Therapeutic activities;Patient/family education;Manual techniques;Vasopneumatic Device    PT Next Visit Plan Level 1 Nustep, PROM to patient's right knee.   Vasopneumatic seated with wedge and pillows under right knee.    Consulted and Agree with Plan of Care Patient           Patient will benefit from skilled therapeutic intervention in order to improve the following deficits and impairments:  Pain,Decreased activity tolerance,Decreased strength,Difficulty walking  Visit Diagnosis: Chronic pain of right knee - Plan: PT plan of care cert/re-cert  Stiffness of right knee, not elsewhere classified - Plan: PT plan of care cert/re-cert  Localized edema - Plan: PT plan of care cert/re-cert  Muscle weakness (generalized) - Plan: PT plan of care cert/re-cert     Problem List Patient Active Problem List   Diagnosis Date Noted  . Obese 04/16/2020  . Osteoarthritis of right knee 04/16/2020  . S/P total knee arthroplasty, right 04/15/2020  . Pulmonary nodules 12/05/2019  . Bilateral occipital neuralgia 09/26/2017  . Status post left foot surgery 07/07/2015  . Neuroma 05/20/2015  . HAV (hallux abducto valgus) 05/20/2015  . Hammertoe 05/20/2015  . Prominent metatarsal head 05/20/2015  . Chronic foot pain 05/20/2015  . Anxiety state 03/03/2015  . Hip bursitis 10/22/2014  . Osteophyte of hip 10/21/2014  . Epigastric cramping 11/09/2013  . Nausea  with vomiting 11/06/2013  . Rheumatoid arthritis ([place]) 11/01/2013  . S/P laparoscopic sleeve gastrectomy 10/30/2013  . Anemia 06/04/2013  . CAP (community acquired pneumonia) 02/17/2013  . Restless leg syndrome 09/18/2012  . Morbid obesity with BMI of 45.0-49.9, adult ([place]) 01/18/2012  . Right gluteus tear 01/17/2012  . Nipple discharge in female 07/08/2011  . Chronic insomnia 06/18/2011  . Discharge from nipple 01/06/2011  . TMJ PAIN 01/12/2010  . DEPRESSION 12/10/2009  . SINUSITIS, ACUTE 12/30/2008  . Lumbago 12/30/2008  . EDEMA 10/03/2008  . Hyperlipidemia 08/27/2008  . Essential hypertension 08/27/2008  . GERD 08/27/2008    Shevaun Lovan, [place] 04/22/2020, 4:57 PM  [facility] [address] [place], [place], [address] Phone: [phone]   Fax:  [phone]  Name: Khamia Stambaugh MRN: [id number] Date of Birth: 06-08-1955

## 2020-04-22 NOTE — Therapy (Signed)
[facility] [place], [place], [address] Phone: [phone]   Fax:  [phone]  Physical Therapy Evaluation  Patient Details  Name: [name] MRN: [id number] Date of Birth: March 09, 1955 Referring Provider (PT): Paralee Cancel MD   Encounter Date: 04/22/2020   PT End of Session - 04/22/20 1528    Visit Number 1    Number of Visits 12    Date for PT Re-Evaluation 05/20/20    Authorization Type FOTO AT LEAST EVERY 5TH VISIT.  PROGRESS NOTE AT 10TH VISIT.  KX MODIFIER AFTER 15 VISITS.    PT Start Time 0312    PT Stop Time 0347    PT Time Calculation (min) 35 min    Activity Tolerance Patient tolerated treatment well    Behavior During Therapy [facility] for tasks assessed/performed           Past Medical History:  Diagnosis Date  . ANGIOMA 10/03/2008   REMOVED FROM RIGHT LOWER LEG-BENIGN  . Anxiety   . Aortic atherosclerosis ([place])   . Arthritis    RA  . Bronchitis    hx of   . Cancer (HCC)    BASAL CELL SKIN CANCER  . Complication of anesthesia   . Coronary artery disease   . Depression   . Fibromyalgia   . GERD 08/27/2008  . History of blood transfusion    1995  . History of hiatal hernia    hx of has had surgically repaired  . History of measles   . History of mumps   . History of nonmelanoma skin cancer   . HYPERLIPIDEMIA 08/27/2008  . HYPERTENSION 08/27/2008   pt is currently on no meds   . Pneumonia    hx of   . PONV (postoperative nausea and vomiting)   . Rheumatoid arthritis ([place])   . SEBORRHEIC KERATOSIS 08/27/2008  . Vocal cord paresis    hx of     Past Surgical History:  Procedure Laterality Date  . ABDOMINAL HYSTERECTOMY  1995  . APPENDECTOMY    . bilateral shoulder replacements     . BREAST BIOPSY Left   . BREAST DUCTAL SYSTEM EXCISION  08/16/2011   Procedure: EXCISION DUCTAL SYSTEM BREAST;  Surgeon: Joyice Faster. Cornett, MD;  Location: [facility];  Service: General;  Laterality:  Left;  left breast duct excision  . [place]   times 2; 1980 and 1982  . [place]  . CHOLECYSTECTOMY  1997  . EXCISION/RELEASE BURSA HIP Right 10/21/2014   Procedure: OPEN RIGHT HIP BURSECTOMY, EXOSECTOMY;  Surgeon: Paralee Cancel, MD;  [facility];  Service: Orthopedics;  Laterality: Right;  . foot surgery  2004, 2005, 2008   bilat secondary to neuropathy   . JOINT REPLACEMENT  10,12   lt total knee X2  . KNEE ARTHROSCOPY  2007, 2008, 2009  . LAPAROSCOPIC GASTRIC SLEEVE RESECTION N/A 10/30/2013   Procedure: LAPAROSCOPIC GASTRIC SLEEVE RESECTION WITH HIATAL HERNIA REPAIR AND UPPER ENDOSCOPY;  Surgeon: Greer Pickerel, MD;  Location[facility];  Service: General;  Laterality: N/A;  . NASAL SINUS SURGERY  2013  . OPEN SURGICAL REPAIR OF GLUTEAL TENDON  01/17/2012   Procedure: OPEN SURGICAL REPAIR OF GLUTEAL TENDON;  Surgeon: Mauri Pole, MD;  [facility];  Service: Orthopedics;  Laterality: Right;  . TOTAL KNEE ARTHROPLASTY Right 04/15/2020   Procedure: TOTAL KNEE ARTHROPLASTY;  Surgeon: Paralee Cancel, MD;  [facility];  Service: Orthopedics;  Laterality: Right;  70 mins    There were no vitals filed for this visit.    Subjective Assessment - 04/22/20 1531    Subjective COVID-19 screen performed prior to patient entering clinic.  The patient presents to the clinic today s/p right total knee replacement performed on 04/15/20.  She reports an 8/10 pain- level at rest  today.  She has been walking some with FWW at home.  Any movement causes severe pain.  Icing and keeping knee elevated helps decrease the pain somewhat.    Pertinent History Left TKA, RA, Fibromyalgia, HTN.    How long can you sit comfortably? One hour+.    How long can you stand comfortably? Less than 5 minutes.    How long can you walk comfortably? Between room sin home with a FWW.    Patient Stated Goals Get out of pain.    Pain Score 8     Pain Location Knee    Pain Orientation Right     Pain Descriptors / Indicators Aching;Throbbing;Sharp;Spasm;Shooting    Pain Type Surgical pain    Pain Onset More than a month ago    Pain Frequency Constant    Aggravating Factors  See above.    Pain Relieving Factors See above.              [facility] PT Assessment - 04/22/20 0001      Assessment   Medical Diagnosis right total knee replacement.    Referring Provider (PT) Paralee Cancel MD    Onset Date/Surgical Date 04/15/20   Surgery date:  04/15/20.     Precautions   Precaution Comments No ultrasound.      Restrictions   Weight Bearing Restrictions No      Balance Screen   Has the patient fallen in the past 6 months No    Has the patient had a decrease in activity level because of a fear of falling?  No    Is the patient reluctant to leave their home because of a fear of falling?  No      Home Environment   Living Environment Private residence      Prior Function   Level of Independence Independent      Observation/Other Assessments   Observations Aquacel intact over right anterior knee.    Focus on Therapeutic Outcomes (FOTO)  Complete.      Observation/Other Assessments-Edema    Edema --   Moderate right knee edema.     ROM / Strength   AROM / PROM / Strength PROM;Strength      PROM   Overall PROM Comments Gentle passive right knee extension to -12 degrees and 75 degrees of flexion.      Strength   Overall Strength Comments No active right hip or knee movement currently.      Palpation   Palpation comment Patient c/o diffuse right knee pain currently.      Ambulation/Gait   Gait Comments 10 feet walk with FWW.                      Objective measurements completed on examination: See above findings.       [facility] Adult PT Treatment/Exercise - 04/22/20 0001      Modalities   Modalities Vasopneumatic      Vasopneumatic   Number Minutes Vasopneumatic  15 minutes    Vasopnuematic Location  --   RT knee seated with wedge and two pillows under  knee.  PT Long Term Goals - 04/22/20 1638      PT LONG TERM GOAL #1   Title Patient will be independent with HEP    Time 4    Status New      PT LONG TERM GOAL #2   Title Active right knee flexion to 110-115 degrees+ so the patient can perform functional tasks and do so with pain not > 2-3/10.    Time 4    Period Weeks    Status New      PT LONG TERM GOAL #3   Title Full right active knee extension in order to normalize gait.    Time 4    Period Weeks    Status New      PT LONG TERM GOAL #4   Title Increase right hip and knee strength to a solid 4+/5 to provide good stability for accomplishment of functional activities.    Time 4    Period Weeks    Status New      PT LONG TERM GOAL #5   Title Walk 200 feet with straight cane.    Time 4    Period Weeks    Status New                  Plan - 04/22/20 1627    Clinical Impression Statement The patient presents to the OPPT s/p right total knee replacement performed on 04/15/20.  She has been in a great deal of pain. She currently has no active right hip or knee movement currently.  She has moderate edema.  She has limited passive right knee range of motion.  She can walk a short distance her functional mobility is impaired. Patient will benefit from skilled physical therapy intervention to address pain and deficits.    Personal Factors and Comorbidities Comorbidity 1;Comorbidity 2;Other    Comorbidities Left TKA, RA, Fibromyalgia, HTN.    Examination-Activity Limitations Other;Locomotion Level    Examination-Participation Restrictions Other    Stability/Clinical Decision Making Stable/Uncomplicated    Clinical Decision Making Low    Rehab Potential Good    PT Treatment/Interventions ADLs/Self Care Home Management;Cryotherapy;Electrical Stimulation;Moist Heat;Neuromuscular re-education;Therapeutic exercise;Therapeutic activities;Patient/family education;Manual techniques;Vasopneumatic  Device    PT Next Visit Plan Level 1 Nustep, PROM to patient's right knee.  Vasopneumatic seated with wedge and pillows under right knee.    Consulted and Agree with Plan of Care Patient           Patient will benefit from skilled therapeutic intervention in order to improve the following deficits and impairments:  Pain,Decreased activity tolerance,Decreased strength,Difficulty walking  Visit Diagnosis: Chronic pain of right knee - Plan: PT plan of care cert/re-cert  Stiffness of right knee, not elsewhere classified - Plan: PT plan of care cert/re-cert  Localized edema - Plan: PT plan of care cert/re-cert  Muscle weakness (generalized) - Plan: PT plan of care cert/re-cert     Problem List Patient Active Problem List   Diagnosis Date Noted  . Obese 04/16/2020  . Osteoarthritis of right knee 04/16/2020  . S/P total knee arthroplasty, right 04/15/2020  . Pulmonary nodules 12/05/2019  . Bilateral occipital neuralgia 09/26/2017  . Status post left foot surgery 07/07/2015  . Neuroma 05/20/2015  . HAV (hallux abducto valgus) 05/20/2015  . Hammertoe 05/20/2015  . Prominent metatarsal head 05/20/2015  . Chronic foot pain 05/20/2015  . Anxiety state 03/03/2015  . Hip bursitis 10/22/2014  . Osteophyte of hip 10/21/2014  . Epigastric cramping 11/09/2013  . Nausea  with vomiting 11/06/2013  . Rheumatoid arthritis ([place]) 11/01/2013  . S/P laparoscopic sleeve gastrectomy 10/30/2013  . Anemia 06/04/2013  . CAP (community acquired pneumonia) 02/17/2013  . Restless leg syndrome 09/18/2012  . Morbid obesity with BMI of 45.0-49.9, adult ([place]) 01/18/2012  . Right gluteus tear 01/17/2012  . Nipple discharge in female 07/08/2011  . Chronic insomnia 06/18/2011  . Discharge from nipple 01/06/2011  . TMJ PAIN 01/12/2010  . DEPRESSION 12/10/2009  . SINUSITIS, ACUTE 12/30/2008  . Lumbago 12/30/2008  . EDEMA 10/03/2008  . Hyperlipidemia 08/27/2008  . Essential hypertension 08/27/2008  .  GERD 08/27/2008    Mackey Varricchio, [place]  MPT 04/22/2020, 4:57 PM  [facility] [address] [place], [place], [address] Phone: [phone]   Fax:  [phone]  Name: Shakiyah Cirilo MRN: [id number] Date of Birth: Jan 05, 1956

## 2020-04-24 ENCOUNTER — Other Ambulatory Visit: Payer: Self-pay

## 2020-04-24 ENCOUNTER — Ambulatory Visit: Payer: Medicare Other | Admitting: Physical Therapy

## 2020-04-24 DIAGNOSIS — G8929 Other chronic pain: Secondary | ICD-10-CM | POA: Diagnosis not present

## 2020-04-24 DIAGNOSIS — M25661 Stiffness of right knee, not elsewhere classified: Secondary | ICD-10-CM

## 2020-04-24 DIAGNOSIS — R600 Localized edema: Secondary | ICD-10-CM

## 2020-04-24 DIAGNOSIS — M25561 Pain in right knee: Secondary | ICD-10-CM

## 2020-04-24 DIAGNOSIS — M6281 Muscle weakness (generalized): Secondary | ICD-10-CM | POA: Diagnosis not present

## 2020-04-24 NOTE — Therapy (Signed)
[facility] [place], [place], [address] Phone: [phone]   Fax:  [phone]  Physical Therapy Treatment  Patient Details  Name: [name] MRN: [id number] Date of Birth: 01-Jul-1955 Referring Provider (PT): Paralee Cancel MD   Encounter Date: 04/24/2020   PT End of Session - 04/24/20 1523    Visit Number 2    Number of Visits 12    Date for PT Re-Evaluation 05/20/20    PT Start Time 0235    PT Stop Time 0327    PT Time Calculation (min) 52 min    Activity Tolerance Patient tolerated treatment well    Behavior During Therapy [facility] for tasks assessed/performed           Past Medical History:  Diagnosis Date  . ANGIOMA 10/03/2008   REMOVED FROM RIGHT LOWER LEG-BENIGN  . Anxiety   . Aortic atherosclerosis ([place])   . Arthritis    RA  . Bronchitis    hx of   . Cancer (HCC)    BASAL CELL SKIN CANCER  . Complication of anesthesia   . Coronary artery disease   . Depression   . Fibromyalgia   . GERD 08/27/2008  . History of blood transfusion    1995  . History of hiatal hernia    hx of has had surgically repaired  . History of measles   . History of mumps   . History of nonmelanoma skin cancer   . HYPERLIPIDEMIA 08/27/2008  . HYPERTENSION 08/27/2008   pt is currently on no meds   . Pneumonia    hx of   . PONV (postoperative nausea and vomiting)   . Rheumatoid arthritis ([place])   . SEBORRHEIC KERATOSIS 08/27/2008  . Vocal cord paresis    hx of     Past Surgical History:  Procedure Laterality Date  . ABDOMINAL HYSTERECTOMY  1995  . APPENDECTOMY    . bilateral shoulder replacements     . BREAST BIOPSY Left   . BREAST DUCTAL SYSTEM EXCISION  08/16/2011   Procedure: EXCISION DUCTAL SYSTEM BREAST;  Surgeon: Joyice Faster. Cornett, MD;  Location: [facility];  Service: General;  Laterality: Left;  left breast duct excision  . [place]   times 2; 1980 and 1982  . [place]  .  CHOLECYSTECTOMY  1997  . EXCISION/RELEASE BURSA HIP Right 10/21/2014   Procedure: OPEN RIGHT HIP BURSECTOMY, EXOSECTOMY;  Surgeon: Paralee Cancel, MD;  [facility];  Service: Orthopedics;  Laterality: Right;  . foot surgery  2004, 2005, 2008   bilat secondary to neuropathy   . JOINT REPLACEMENT  10,12   lt total knee X2  . KNEE ARTHROSCOPY  2007, 2008, 2009  . LAPAROSCOPIC GASTRIC SLEEVE RESECTION N/A 10/30/2013   Procedure: LAPAROSCOPIC GASTRIC SLEEVE RESECTION WITH HIATAL HERNIA REPAIR AND UPPER ENDOSCOPY;  Surgeon: Greer Pickerel, MD;  [facility];  Service: General;  Laterality: N/A;  . NASAL SINUS SURGERY  2013  . OPEN SURGICAL REPAIR OF GLUTEAL TENDON  01/17/2012   Procedure: OPEN SURGICAL REPAIR OF GLUTEAL TENDON;  Surgeon: Mauri Pole, MD;  [facility];  Service: Orthopedics;  Laterality: Right;  . TOTAL KNEE ARTHROPLASTY Right 04/15/2020   Procedure: TOTAL KNEE ARTHROPLASTY;  Surgeon: Paralee Cancel, MD;  [facility];  Service: Orthopedics;  Laterality: Right;  70 mins    There were no vitals filed for this visit.   Subjective Assessment - 04/24/20 1522  Subjective COVID-19 screen performed prior to patient entering clinic.  Doing better today.    Pertinent History Left TKA, RA, Fibromyalgia, HTN.    How long can you sit comfortably? One hour+.    How long can you stand comfortably? Less than 5 minutes.    How long can you walk comfortably? Between room sin home with a FWW.    Patient Stated Goals Get out of pain.    Currently in Pain? Yes    Pain Score 6     Pain Location Knee    Pain Orientation Right    Pain Descriptors / Indicators Aching;Throbbing;Sharp;Shooting    Pain Type Surgical pain    Pain Onset More than a month ago                             [facility] Adult PT Treatment/Exercise - 04/24/20 0001      Exercises   Exercises Knee/Hip      Knee/Hip Exercises: Aerobic   Nustep Monitored Nustep on level 1 for knee range of motion  x 21 minutes with patient able to tolerate multiple seat movements forward.      Modalities   Modalities Vasopneumatic      Vasopneumatic   Number Minutes Vasopneumatic  20 minutes    Vasopnuematic Location  --   Seated with wedge and pillows under right knee for comfort   Vasopneumatic Pressure Low      Manual Therapy   Manual Therapy Passive ROM    Passive ROM Seated passive right knee range of motion into flexion x 3 minutes.                       PT Long Term Goals - 04/22/20 1638      PT LONG TERM GOAL #1   Title Patient will be independent with HEP    Time 4    Status New      PT LONG TERM GOAL #2   Title Active right knee flexion to 110-115 degrees+ so the patient can perform functional tasks and do so with pain not > 2-3/10.    Time 4    Period Weeks    Status New      PT LONG TERM GOAL #3   Title Full right active knee extension in order to normalize gait.    Time 4    Period Weeks    Status New      PT LONG TERM GOAL #4   Title Increase right hip and knee strength to a solid 4+/5 to provide good stability for accomplishment of functional activities.    Time 4    Period Weeks    Status New      PT LONG TERM GOAL #5   Title Walk 200 feet with straight cane.    Time 4    Period Weeks    Status New                 Plan - 04/24/20 1526    Clinical Impression Statement Patient doing much better today and presented to the clinic today walking with her FWW.  Able to tolerate Nustep exercise very well today.    Personal Factors and Comorbidities Comorbidity 1;Comorbidity 2;Other    Comorbidities Left TKA, RA, Fibromyalgia, HTN.    Examination-Activity Limitations Other;Locomotion Level    Examination-Participation Restrictions Other    Stability/Clinical Decision Making Stable/Uncomplicated  Clinical Decision Making Low    Rehab Potential Good    PT Frequency 3x / week    PT Duration 4 weeks    PT Treatment/Interventions ADLs/Self  Care Home Management;Cryotherapy;Electrical Stimulation;Moist Heat;Neuromuscular re-education;Therapeutic exercise;Therapeutic activities;Patient/family education;Manual techniques;Vasopneumatic Device    PT Next Visit Plan Level 1 Nustep, PROM to patient's right knee.  Vasopneumatic seated with wedge and pillows under right knee.    Consulted and Agree with Plan of Care Patient           Patient will benefit from skilled therapeutic intervention in order to improve the following deficits and impairments:  Pain,Decreased activity tolerance,Decreased strength,Difficulty walking  Visit Diagnosis: Chronic pain of right knee  Stiffness of right knee, not elsewhere classified  Localized edema     Problem List Patient Active Problem List   Diagnosis Date Noted  . Obese 04/16/2020  . Osteoarthritis of right knee 04/16/2020  . S/P total knee arthroplasty, right 04/15/2020  . Pulmonary nodules 12/05/2019  . Bilateral occipital neuralgia 09/26/2017  . Status post left foot surgery 07/07/2015  . Neuroma 05/20/2015  . HAV (hallux abducto valgus) 05/20/2015  . Hammertoe 05/20/2015  . Prominent metatarsal head 05/20/2015  . Chronic foot pain 05/20/2015  . Anxiety state 03/03/2015  . Hip bursitis 10/22/2014  . Osteophyte of hip 10/21/2014  . Epigastric cramping 11/09/2013  . Nausea with vomiting 11/06/2013  . Rheumatoid arthritis ([place]) 11/01/2013  . S/P laparoscopic sleeve gastrectomy 10/30/2013  . Anemia 06/04/2013  . CAP (community acquired pneumonia) 02/17/2013  . Restless leg syndrome 09/18/2012  . Morbid obesity with BMI of 45.0-49.9, adult ([place]) 01/18/2012  . Right gluteus tear 01/17/2012  . Nipple discharge in female 07/08/2011  . Chronic insomnia 06/18/2011  . Discharge from nipple 01/06/2011  . TMJ PAIN 01/12/2010  . DEPRESSION 12/10/2009  . SINUSITIS, ACUTE 12/30/2008  . Lumbago 12/30/2008  . EDEMA 10/03/2008  . Hyperlipidemia 08/27/2008  . Essential hypertension  08/27/2008  . GERD 08/27/2008    Jaylise Peek, [place] MPT 04/24/2020, 3:36 PM  [facility] [address] [place], [place], [address] Phone: [phone]   Fax:  [phone]  Name: [name] MRN: [id number] Date of Birth: 1955/07/10

## 2020-04-29 ENCOUNTER — Ambulatory Visit: Payer: Medicare Other | Admitting: Physical Therapy

## 2020-04-29 ENCOUNTER — Other Ambulatory Visit: Payer: Self-pay

## 2020-04-29 DIAGNOSIS — M25661 Stiffness of right knee, not elsewhere classified: Secondary | ICD-10-CM

## 2020-04-29 DIAGNOSIS — M25561 Pain in right knee: Secondary | ICD-10-CM

## 2020-04-29 DIAGNOSIS — G8929 Other chronic pain: Secondary | ICD-10-CM

## 2020-04-29 DIAGNOSIS — R600 Localized edema: Secondary | ICD-10-CM

## 2020-04-29 DIAGNOSIS — M6281 Muscle weakness (generalized): Secondary | ICD-10-CM | POA: Diagnosis not present

## 2020-04-29 NOTE — Therapy (Signed)
[facility] [place], [place], [address] Phone: [phone]   Fax:  [phone]  Physical Therapy Treatment  Patient Details  Name: [name] MRN: [id number] Date of Birth: 02-03-1956 Referring Provider (PT): Paralee Cancel MD   Encounter Date: 04/29/2020   PT End of Session - 04/29/20 1322    Visit Number 3    Number of Visits 12    Date for PT Re-Evaluation 05/20/20    Authorization Type FOTO AT LEAST EVERY 5TH VISIT.  PROGRESS NOTE AT 10TH VISIT.  KX MODIFIER AFTER 15 VISITS.    PT Start Time 1251    PT Stop Time 1338    PT Time Calculation (min) 47 min    Activity Tolerance Patient tolerated treatment well    Behavior During Therapy WFL for tasks assessed/performed           Past Medical History:  Diagnosis Date  . ANGIOMA 10/03/2008   REMOVED FROM RIGHT LOWER LEG-BENIGN  . Anxiety   . Aortic atherosclerosis ([place])   . Arthritis    RA  . Bronchitis    hx of   . Cancer (HCC)    BASAL CELL SKIN CANCER  . Complication of anesthesia   . Coronary artery disease   . Depression   . Fibromyalgia   . GERD 08/27/2008  . History of blood transfusion    1995  . History of hiatal hernia    hx of has had surgically repaired  . History of measles   . History of mumps   . History of nonmelanoma skin cancer   . HYPERLIPIDEMIA 08/27/2008  . HYPERTENSION 08/27/2008   pt is currently on no meds   . Pneumonia    hx of   . PONV (postoperative nausea and vomiting)   . Rheumatoid arthritis ([place])   . SEBORRHEIC KERATOSIS 08/27/2008  . Vocal cord paresis    hx of     Past Surgical History:  Procedure Laterality Date  . ABDOMINAL HYSTERECTOMY  1995  . APPENDECTOMY    . bilateral shoulder replacements     . BREAST BIOPSY Left   . BREAST DUCTAL SYSTEM EXCISION  08/16/2011   Procedure: EXCISION DUCTAL SYSTEM BREAST;  Surgeon: Joyice Faster. Cornett, MD;  Location: [facility];  Service: General;  Laterality:  Left;  left breast duct excision  . [place]   times 2; 1980 and 1982  . [place]  . CHOLECYSTECTOMY  1997  . EXCISION/RELEASE BURSA HIP Right 10/21/2014   Procedure: OPEN RIGHT HIP BURSECTOMY, EXOSECTOMY;  Surgeon: Paralee Cancel, MD;  Location: [facility];  Service: Orthopedics;  Laterality: Right;  . foot surgery  2004, 2005, 2008   bilat secondary to neuropathy   . JOINT REPLACEMENT  10,12   lt total knee X2  . KNEE ARTHROSCOPY  2007, 2008, 2009  . LAPAROSCOPIC GASTRIC SLEEVE RESECTION N/A 10/30/2013   Procedure: LAPAROSCOPIC GASTRIC SLEEVE RESECTION WITH HIATAL HERNIA REPAIR AND UPPER ENDOSCOPY;  Surgeon: Greer Pickerel, MD;  Location: [facility];  Service: General;  Laterality: N/A;  . NASAL SINUS SURGERY  2013  . OPEN SURGICAL REPAIR OF GLUTEAL TENDON  01/17/2012   Procedure: OPEN SURGICAL REPAIR OF GLUTEAL TENDON;  Surgeon: Mauri Pole, MD;  Location: [facility];  Service: Orthopedics;  Laterality: Right;  . TOTAL KNEE ARTHROPLASTY Right 04/15/2020   Procedure: TOTAL KNEE ARTHROPLASTY;  Surgeon: Paralee Cancel, MD;  Location: [facility];  Service: Orthopedics;  Laterality: Right;  70 mins    There were no vitals filed for this visit.   Subjective Assessment - 04/29/20 1321    Subjective COVID-19 screen performed prior to patient entering clinic.  Pain about a 5 today.    Pertinent History Left TKA, RA, Fibromyalgia, HTN.    How long can you sit comfortably? One hour+.    How long can you stand comfortably? Less than 5 minutes.    How long can you walk comfortably? Between room sin home with a FWW.    Patient Stated Goals Get out of pain.    Currently in Pain? Yes    Pain Score 5     Pain Location Knee    Pain Orientation Right    Pain Descriptors / Indicators Aching;Sharp;Shooting    Pain Type Surgical pain    Pain Onset More than a month ago                             [facility] Adult PT Treatment/Exercise - 04/29/20 0001      Exercises    Exercises Knee/Hip      Knee/Hip Exercises: Aerobic   Nustep Monitored over 20 minutes at level 3 and moving seat forward x 3 to increase knee flexion.      Vasopneumatic   Number Minutes Vasopneumatic  20 minutes    Vasopnuematic Location  --   Right knee.  Seated wedge and pillows under knee for comfort.   Vasopneumatic Pressure Low      Manual Therapy   Manual Therapy Passive ROM    Passive ROM Seated passive knee flexion x 3 minutes to right knee.                       PT Long Term Goals - 04/22/20 1638      PT LONG TERM GOAL #1   Title Patient will be independent with HEP    Time 4    Status New      PT LONG TERM GOAL #2   Title Active right knee flexion to 110-115 degrees+ so the patient can perform functional tasks and do so with pain not > 2-3/10.    Time 4    Period Weeks    Status New      PT LONG TERM GOAL #3   Title Full right active knee extension in order to normalize gait.    Time 4    Period Weeks    Status New      PT LONG TERM GOAL #4   Title Increase right hip and knee strength to a solid 4+/5 to provide good stability for accomplishment of functional activities.    Time 4    Period Weeks    Status New      PT LONG TERM GOAL #5   Title Walk 200 feet with straight cane.    Time 4    Period Weeks    Status New                 Plan - 04/29/20 1328    Clinical Impression Statement Patient doing much better since beginning PT.  Her pain is coming down and she performed the Nustep at an increased resistance and increased cadence.    Personal Factors and Comorbidities Comorbidity 1;Comorbidity 2;Other    Comorbidities Left TKA, RA, Fibromyalgia, HTN.    Examination-Activity Limitations Other;Locomotion Level  Examination-Participation Restrictions Other    Stability/Clinical Decision Making Stable/Uncomplicated    Rehab Potential Good    PT Frequency 3x / week    PT Duration 4 weeks    PT Treatment/Interventions ADLs/Self  Care Home Management;Cryotherapy;Electrical Stimulation;Moist Heat;Neuromuscular re-education;Therapeutic exercise;Therapeutic activities;Patient/family education;Manual techniques;Vasopneumatic Device    PT Next Visit Plan Level 1 Nustep, PROM to patient's right knee.  Vasopneumatic seated with wedge and pillows under right knee.    Consulted and Agree with Plan of Care Patient           Patient will benefit from skilled therapeutic intervention in order to improve the following deficits and impairments:  Pain,Decreased activity tolerance,Decreased strength,Difficulty walking  Visit Diagnosis: Chronic pain of right knee  Stiffness of right knee, not elsewhere classified  Localized edema     Problem List Patient Active Problem List   Diagnosis Date Noted  . Obese 04/16/2020  . Osteoarthritis of right knee 04/16/2020  . S/P total knee arthroplasty, right 04/15/2020  . Pulmonary nodules 12/05/2019  . Bilateral occipital neuralgia 09/26/2017  . Status post left foot surgery 07/07/2015  . Neuroma 05/20/2015  . HAV (hallux abducto valgus) 05/20/2015  . Hammertoe 05/20/2015  . Prominent metatarsal head 05/20/2015  . Chronic foot pain 05/20/2015  . Anxiety state 03/03/2015  . Hip bursitis 10/22/2014  . Osteophyte of hip 10/21/2014  . Epigastric cramping 11/09/2013  . Nausea with vomiting 11/06/2013  . Rheumatoid arthritis ([place]) 11/01/2013  . S/P laparoscopic sleeve gastrectomy 10/30/2013  . Anemia 06/04/2013  . CAP (community acquired pneumonia) 02/17/2013  . Restless leg syndrome 09/18/2012  . Morbid obesity with BMI of 45.0-49.9, adult ([place]) 01/18/2012  . Right gluteus tear 01/17/2012  . Nipple discharge in female 07/08/2011  . Chronic insomnia 06/18/2011  . Discharge from nipple 01/06/2011  . TMJ PAIN 01/12/2010  . DEPRESSION 12/10/2009  . SINUSITIS, ACUTE 12/30/2008  . Lumbago 12/30/2008  . EDEMA 10/03/2008  . Hyperlipidemia 08/27/2008  . Essential hypertension  08/27/2008  . GERD 08/27/2008    Eisa Conaway, [place] MPT 04/29/2020, 1:40 PM  [facility] [address] [place], [place], [address] Phone: [phone]   Fax:  [phone]  Name: Amyia Lodwick MRN: [id number] Date of Birth: 30-Jan-1956

## 2020-04-29 NOTE — Discharge Summary (Signed)
Physician Discharge Summary   Patient ID: [name] MRN: [id number] DOB/AGE: [birth year] 65 y.o.  Admit date: 04/15/2020 Discharge date: 04/17/2020  Primary Diagnosis:  Right knee osteoarthritis  Admission Diagnoses:  Past Medical History:  Diagnosis Date  . ANGIOMA 10/03/2008   REMOVED FROM RIGHT LOWER LEG-BENIGN  . Anxiety   . Aortic atherosclerosis ([place])   . Arthritis    RA  . Bronchitis    hx of   . Cancer (HCC)    BASAL CELL SKIN CANCER  . Complication of anesthesia   . Coronary artery disease   . Depression   . Fibromyalgia   . GERD 08/27/2008  . History of blood transfusion    1995  . History of hiatal hernia    hx of has had surgically repaired  . History of measles   . History of mumps   . History of nonmelanoma skin cancer   . HYPERLIPIDEMIA 08/27/2008  . HYPERTENSION 08/27/2008   pt is currently on no meds   . Pneumonia    hx of   . PONV (postoperative nausea and vomiting)   . Rheumatoid arthritis ([place])   . SEBORRHEIC KERATOSIS 08/27/2008  . Vocal cord paresis    hx of    Discharge Diagnoses:   Principal Problem:   Osteoarthritis of right knee Active Problems:   S/P total knee arthroplasty, right   Obese  Estimated body mass index is 36.63 kg/m as calculated from the following:   Height as of this encounter: 5\' 4"  (1.626 m).   Weight as of this encounter: 96.8 kg.  Procedure:  Procedure(s) (LRB): TOTAL KNEE ARTHROPLASTY (Right)   Consults: None  HPI: Zain Bingman is a 65 y.o. female patient of   mine.  The patient had been seen, evaluated, and treated for months conservatively in the   office with medication, activity modification, and injections.  The patient had   radiographic changes of bone-on-bone arthritis with endplate sclerosis and osteophytes noted.  Based on the radiographic changes and failed conservative measures, the patient   decided to proceed with definitive treatment, total knee replacement.  Risks of infection, DVT,  component failure, need for revision surgery, neurovascular injury were reviewed in the office setting.  The postop course was reviewed stressing the efforts to maximize post-operative satisfaction and function.  Consent was obtained for benefit of pain   relief.   Laboratory Data: Admission on 04/15/2020, Discharged on 04/17/2020  Component Date Value Ref Range Status  . WBC 04/16/2020 12.1* 4.0 - 10.5 K/uL Final  . RBC 04/16/2020 3.64* 3.87 - 5.11 MIL/uL Final  . Hemoglobin 04/16/2020 10.8* 12.0 - 15.0 g/dL Final  . HCT 04/16/2020 33.7* 36.0 - 46.0 % Final  . MCV 04/16/2020 92.6  80.0 - 100.0 fL Final  . MCH 04/16/2020 29.7  26.0 - 34.0 pg Final  . MCHC 04/16/2020 32.0  30.0 - 36.0 g/dL Final  . RDW 04/16/2020 14.0  11.5 - 15.5 % Final  . Platelets 04/16/2020 160  150 - 400 K/uL Final   Comment: SPECIMEN CHECKED FOR CLOTS REPEATED TO VERIFY PLATELET COUNT CONFIRMED BY SMEAR   . nRBC 04/16/2020 0.0  0.0 - 0.2 % Final   Performed at [facility] [address], [place], [place] [address]  . Sodium 04/16/2020 137  135 - 145 mmol/L Final  . Potassium 04/16/2020 3.8  3.5 - 5.1 mmol/L Final  . Chloride 04/16/2020 106  98 - 111 mmol/L Final  . CO2 04/16/2020  22  22 - 32 mmol/L Final  . Glucose, Bld 04/16/2020 133* 70 - 99 mg/dL Final   Glucose reference range applies only to samples taken after fasting for at least 8 hours.  . BUN 04/16/2020 12  8 - 23 mg/dL Final  . Creatinine, Ser 04/16/2020 0.52  0.44 - 1.00 mg/dL Final  . Calcium 04/16/2020 8.8* 8.9 - 10.3 mg/dL Final  . GFR, Estimated 04/16/2020 >60  >60 mL/min Final   Comment: (NOTE) Calculated using the CKD-EPI Creatinine Equation (2021)   . Anion gap 04/16/2020 9  5 - 15 Final   Performed at [facility], [place] [address] [place], [place] [address]  . WBC 04/17/2020 10.3  4.0 - 10.5 K/uL Final  . RBC 04/17/2020 3.30* 3.87 - 5.11 MIL/uL Final  . Hemoglobin 04/17/2020 9.9* 12.0 - 15.0 g/dL  Final  . HCT 04/17/2020 31.9* 36.0 - 46.0 % Final  . MCV 04/17/2020 96.7  80.0 - 100.0 fL Final  . MCH 04/17/2020 30.0  26.0 - 34.0 pg Final  . MCHC 04/17/2020 31.0  30.0 - 36.0 g/dL Final  . RDW 04/17/2020 14.0  11.5 - 15.5 % Final  . Platelets 04/17/2020 261  150 - 400 K/uL Final  . nRBC 04/17/2020 0.0  0.0 - 0.2 % Final   Performed at [facility], [place] [address], [place], [place] [address]  . Sodium 04/17/2020 141  135 - 145 mmol/L Final  . Potassium 04/17/2020 4.3  3.5 - 5.1 mmol/L Final  . Chloride 04/17/2020 108  98 - 111 mmol/L Final  . CO2 04/17/2020 22  22 - 32 mmol/L Final  . Glucose, Bld 04/17/2020 123* 70 - 99 mg/dL Final   Glucose reference range applies only to samples taken after fasting for at least 8 hours.  . BUN 04/17/2020 15  8 - 23 mg/dL Final  . Creatinine, Ser 04/17/2020 0.55  0.44 - 1.00 mg/dL Final  . Calcium 04/17/2020 8.8* 8.9 - 10.3 mg/dL Final  . GFR, Estimated 04/17/2020 >60  >60 mL/min Final   Comment: (NOTE) Calculated using the CKD-EPI Creatinine Equation (2021)   . Anion gap 04/17/2020 11  5 - 15 Final   Performed at [facility], [place] [address] [place], [place] [address]  Hospital Outpatient Visit on 04/11/2020  Component Date Value Ref Range Status  . SARS Coronavirus 2 04/11/2020 NEGATIVE  NEGATIVE Final   Comment: (NOTE) SARS-CoV-2 target nucleic acids are NOT DETECTED.  The SARS-CoV-2 RNA is generally detectable in upper and lower respiratory specimens during the acute phase of infection. Negative results do not preclude SARS-CoV-2 infection, do not rule out co-infections with other pathogens, and should not be used as the sole basis for treatment or other patient management decisions. Negative results must be combined with clinical observations, patient history, and epidemiological information. The expected result is Negative.  Fact Sheet for  Patients: SugarRoll.be  Fact Sheet for Healthcare Providers: https://www.woods-mathews.com/  This test is not yet approved or cleared by the [place] FDA and  has been authorized for detection and/or diagnosis of SARS-CoV-2 by FDA under an Emergency Use Authorization (EUA). This EUA will remain  in effect (meaning this test can be used) for the duration of the COVID-19 declaration under Se                          ction 564(b)(1) of the Act, 21 U.S.C. section 360bbb-3(b)(1), unless the authorization  is terminated or revoked sooner.  Performed at [facility], [place] [address], [place], [place] [address]   Hospital Outpatient Visit on 04/08/2020  Component Date Value Ref Range Status  . Sodium 04/08/2020 142  135 - 145 mmol/L Final  . Potassium 04/08/2020 3.7  3.5 - 5.1 mmol/L Final  . Chloride 04/08/2020 105  98 - 111 mmol/L Final  . CO2 04/08/2020 28  22 - 32 mmol/L Final  . Glucose, Bld 04/08/2020 94  70 - 99 mg/dL Final   Glucose reference range applies only to samples taken after fasting for at least 8 hours.  . BUN 04/08/2020 12  8 - 23 mg/dL Final  . Creatinine, Ser 04/08/2020 0.48  0.44 - 1.00 mg/dL Final  . Calcium 04/08/2020 9.2  8.9 - 10.3 mg/dL Final  . GFR, Estimated 04/08/2020 >60  >60 mL/min Final   Comment: (NOTE) Calculated using the CKD-EPI Creatinine Equation (2021)   . Anion gap 04/08/2020 9  5 - 15 Final   Performed at [facility], [place] [address] [place], [place] [address]  . WBC 04/08/2020 7.0  4.0 - 10.5 K/uL Final  . RBC 04/08/2020 3.97  3.87 - 5.11 MIL/uL Final  . Hemoglobin 04/08/2020 11.9* 12.0 - 15.0 g/dL Final  . HCT 04/08/2020 37.5  36.0 - 46.0 % Final  . MCV 04/08/2020 94.5  80.0 - 100.0 fL Final  . MCH 04/08/2020 30.0  26.0 - 34.0 pg Final  . MCHC 04/08/2020 31.7  30.0 - 36.0 g/dL Final  . RDW 04/08/2020 14.0  11.5 - 15.5 % Final  . Platelets 04/08/2020 279  150 - 400 K/uL  Final  . nRBC 04/08/2020 0.0  0.0 - 0.2 % Final   Performed at [facility], [place] [address], [place], [place] [address]  . MRSA, PCR 04/08/2020 NEGATIVE  NEGATIVE Final  . Staphylococcus aureus 04/08/2020 NEGATIVE  NEGATIVE Final   Comment: (NOTE) The Xpert SA Assay (FDA approved for NASAL specimens in patients 52 years of age and older), is one component of a comprehensive surveillance program. It is not intended to diagnose infection nor to guide or monitor treatment. Performed at [facility], [place] [address] [place], [place] [address]   . ABO/RH(D) 04/08/2020 B POS   Final  . Antibody Screen 04/08/2020 NEG   Final  . Sample Expiration 04/08/2020 04/18/2020,2359   Final  . Extend sample reason 04/08/2020    Final                   Value:NO TRANSFUSIONS OR PREGNANCY IN THE PAST 3 MONTHS Performed at [facility] [address] [place], [place] [address]      X-Rays:No results found.  EKG: Orders placed or performed in visit on 12/24/19  . EKG 12-Lead     Hospital Course: Jaianna Nicoll is a 65 y.o. who was admitted to [facility]. They were brought to the operating room on 04/15/2020 and underwent Procedure(s): TOTAL KNEE ARTHROPLASTY.  Patient tolerated the procedure well and was later transferred to the recovery room and then to the orthopaedic floor for postoperative care. They were given PO and IV analgesics for pain control following their surgery. They were given 24 hours of postoperative antibiotics of  Anti-infectives (From admission, onward)   Start     Dose/Rate Route Frequency Ordered Stop   04/15/20 2000  vancomycin (VANCOREADY) IVPB 1000 mg/200 mL  1,000 mg 200 mL/hr over 60 Minutes Intravenous Every 12 hours 04/15/20 1110 04/15/20 2109   04/15/20 0745  gentamicin (GARAMYCIN) 360 mg in dextrose 5 % 100 mL IVPB        5 mg/kg  71.5 kg (Adjusted) 109 mL/hr over 60 Minutes Intravenous  Once  04/15/20 0738 04/15/20 0923   04/15/20 0745  vancomycin (VANCOCIN) IVPB 1000 mg/200 mL premix        1,000 mg 200 mL/hr over 60 Minutes Intravenous  Once 04/15/20 0738 04/15/20 0853   04/15/20 0737  vancomycin (VANCOCIN) 1-5 GM/200ML-% IVPB       Note to Pharmacy: Dellie Catholic   : cabinet override      04/15/20 0737 04/15/20 0832   04/15/20 0630  ceFAZolin (ANCEF) IVPB 2g/100 mL premix  Status:  Discontinued        2 g 200 mL/hr over 30 Minutes Intravenous On call to O.R. 04/15/20 1497 04/15/20 0756     and started on DVT prophylaxis in the form of Aspirin.   PT and OT were ordered for total joint protocol. Discharge planning consulted to help with postop disposition and equipment needs. Patient had a good night on the evening of surgery. They started to get up OOB with therapy on POD #0. Continued to work with therapy into POD #2. Pt was seen during rounds on day two and was ready to go home pending progress with therapy. Pt worked with therapy for one additional session and was meeting their goals. She was discharged to home later that day in stable condition.  Diet: Regular diet Activity: WBAT Follow-up: in 2 weeks Disposition: Home Discharged Condition: good   Discharge Instructions    Call MD / Call 911   Complete by: As directed    If you experience chest pain or shortness of breath, CALL 911 and be transported to the hospital emergency room.  If you develope a fever above 101 F, pus (white drainage) or increased drainage or redness at the wound, or calf pain, call your surgeon's office.   Change dressing   Complete by: As directed    Maintain surgical dressing until follow up in the clinic. If the edges start to pull up, may reinforce with tape. If the dressing is no longer working, may remove and cover with gauze and tape, but must keep the area dry and clean.  Call with any questions or concerns.   Constipation Prevention   Complete by: As directed    Drink plenty of fluids.   Prune juice may be helpful.  You may use a stool softener, such as Colace (over the counter) 100 mg twice a day.  Use MiraLax (over the counter) for constipation as needed.   Diet - low sodium heart healthy   Complete by: As directed    Discharge instructions   Complete by: As directed    Maintain surgical dressing until follow up in the clinic. If the edges start to pull up, may reinforce with tape. If the dressing is no longer working, may remove and cover with gauze and tape, but must keep the area dry and clean.  Follow up in 2 weeks at [facility]. Call with any questions or concerns.   Increase activity slowly as tolerated   Complete by: As directed    Weight bearing as tolerated with assist device (walker, cane, etc) as directed, use it as long as suggested by your surgeon or therapist, typically at least 4-6 weeks.   TED hose  Complete by: As directed    Use stockings (TED hose) for 2 weeks on both leg(s).  You may remove them at night for sleeping.     Allergies as of 04/17/2020      Reactions   Bee Venom Anaphylaxis   Keflex [cephalexin] Hives   Pt took po kelfex for sinus infection, developed blisters over entire body   Codeine Sulfate Other (See Comments)   GI upset   Penicillins Rash      Medication List    STOP taking these medications   acetaminophen 650 MG CR tablet Commonly known as: TYLENOL   aspirin EC 81 MG tablet Replaced by: aspirin 81 MG chewable tablet   estradiol 0.5 MG tablet Commonly known as: Estrace   etanercept 50 MG/ML injection Commonly known as: Enbrel   hydroxychloroquine 200 MG tablet Commonly known as: PLAQUENIL   methotrexate 1 gm Solr Commonly known as: 50 mg/ml   promethazine 25 MG tablet Commonly known as: PHENERGAN     TAKE these medications   aspirin 81 MG chewable tablet Commonly known as: Aspirin Childrens Chew 1 tablet (81 mg total) by mouth 2 (two) times daily. Take for 4 weeks, then resume regular dose. Replaces:  aspirin EC 81 MG tablet   B-D TB SYRINGE 1CC/27GX1/2" 27G X 1/2" 1 ML Misc Generic drug: TUBERCULIN SYR 1CC/27GX1/2"   Biotin 10 MG Caps Take 10 mg by mouth daily.   celecoxib 200 MG capsule Commonly known as: CeleBREX Take 1 capsule (200 mg total) by mouth 2 (two) times daily.   docusate sodium 100 MG capsule Commonly known as: Colace Take 1 capsule (100 mg total) by mouth 2 (two) times daily.   EPINEPHrine 0.3 mg/0.3 mL Soaj injection Commonly known as: EpiPen 2-Pak Inject 0.3 mLs (0.3 mg total) into the muscle once. What changed:   how much to take  how to take this  when to take this  reasons to take this  additional instructions   famotidine 40 MG tablet Commonly known as: PEPCID Take 40 mg by mouth at bedtime.   ferrous sulfate 325 (65 FE) MG tablet Commonly known as: FerrouSul Take 1 tablet (325 mg total) by mouth 3 (three) times daily with meals for 14 days.   folic acid 161 MCG tablet Commonly known as: FOLVITE Take 1,600 mcg by mouth daily.   HYDROcodone-acetaminophen 7.5-325 MG tablet Commonly known as: Norco Take 1-2 tablets by mouth every 4 (four) hours as needed for moderate pain.   methocarbamol 500 MG tablet Commonly known as: Robaxin Take 1 tablet (500 mg total) by mouth every 6 (six) hours as needed for muscle spasms.   ondansetron 4 MG tablet Commonly known as: Zofran Take 1 tablet (4 mg total) by mouth every 8 (eight) hours as needed for nausea or vomiting.   pantoprazole 40 MG tablet Commonly known as: PROTONIX Take 40 mg by mouth daily before breakfast.   polyethylene glycol 17 g packet Commonly known as: MIRALAX / GLYCOLAX Take 17 g by mouth 2 (two) times daily.   predniSONE 5 MG tablet Commonly known as: DELTASONE Take 5 mg by mouth daily with breakfast.   rosuvastatin 20 MG tablet Commonly known as: CRESTOR Take 1 tablet (20 mg total) by mouth daily.   scopolamine 1 MG/3DAYS Commonly known as: TRANSDERM-SCOP Place 1  patch (1.5 mg total) onto the skin every 3 (three) days. Behind ear What changed:   when to take this  additional instructions   sertraline 50 MG tablet  Commonly known as: ZOLOFT TAKE 1 & 1/2 (ONE & ONE-HALF) TABLETS BY MOUTH ONCE DAILY   zolpidem 10 MG tablet Commonly known as: AMBIEN TAKE 1 TABLET BY MOUTH AT BEDTIME AS NEEDED FOR SLEEP What changed:   when to take this  additional instructions            Discharge Care Instructions  (From admission, onward)         Start     Ordered   04/16/20 0000  Change dressing       Comments: Maintain surgical dressing until follow up in the clinic. If the edges start to pull up, may reinforce with tape. If the dressing is no longer working, may remove and cover with gauze and tape, but must keep the area dry and clean.  Call with any questions or concerns.   04/16/20 0810          Follow-up Information    Danae Orleans, PA-C. Go on 04/30/2020.   Specialty: Orthopedic Surgery Why: You are scheduled for a post-operative appointment on 04-30-20 at 2:00 pm.  Contact information: [address] [place] [address] [phone]               Signed: Griffith Citron, PA-C Orthopedic Surgery 04/29/2020, 9:02 AM

## 2020-05-01 ENCOUNTER — Ambulatory Visit: Payer: Medicare Other | Admitting: Physical Therapy

## 2020-05-06 ENCOUNTER — Ambulatory Visit: Payer: Medicare Other | Admitting: Family Medicine

## 2020-05-06 ENCOUNTER — Ambulatory Visit: Payer: Medicare Other | Admitting: Physical Therapy

## 2020-05-06 ENCOUNTER — Other Ambulatory Visit: Payer: Self-pay

## 2020-05-06 ENCOUNTER — Encounter: Payer: Self-pay | Admitting: Physical Therapy

## 2020-05-06 DIAGNOSIS — M6281 Muscle weakness (generalized): Secondary | ICD-10-CM | POA: Diagnosis not present

## 2020-05-06 DIAGNOSIS — R600 Localized edema: Secondary | ICD-10-CM

## 2020-05-06 DIAGNOSIS — M25561 Pain in right knee: Secondary | ICD-10-CM | POA: Diagnosis not present

## 2020-05-06 DIAGNOSIS — M25661 Stiffness of right knee, not elsewhere classified: Secondary | ICD-10-CM

## 2020-05-06 DIAGNOSIS — G8929 Other chronic pain: Secondary | ICD-10-CM

## 2020-05-06 NOTE — Therapy (Signed)
[facility] [place], [place], [address] Phone: [phone]   Fax:  [phone]  Physical Therapy Treatment  Patient Details  Name: [name] MRN: [id number] Date of Birth: [birth year] Referring Provider (PT): Paralee Cancel MD   Encounter Date: 05/06/2020   PT End of Session - 05/06/20 1314    Visit Number 4    Number of Visits 12    Date for PT Re-Evaluation 05/20/20    Authorization Type FOTO AT LEAST EVERY 5TH VISIT.  PROGRESS NOTE AT 10TH VISIT.  KX MODIFIER AFTER 15 VISITS.    PT Start Time 1300    PT Stop Time 1349    PT Time Calculation (min) 49 min    Activity Tolerance Patient tolerated treatment well    Behavior During Therapy WFL for tasks assessed/performed           Past Medical History:  Diagnosis Date  . ANGIOMA 10/03/2008   REMOVED FROM RIGHT LOWER LEG-BENIGN  . Anxiety   . Aortic atherosclerosis ([place])   . Arthritis    RA  . Bronchitis    hx of   . Cancer (HCC)    BASAL CELL SKIN CANCER  . Complication of anesthesia   . Coronary artery disease   . Depression   . Fibromyalgia   . GERD 08/27/2008  . History of blood transfusion    1995  . History of hiatal hernia    hx of has had surgically repaired  . History of measles   . History of mumps   . History of nonmelanoma skin cancer   . HYPERLIPIDEMIA 08/27/2008  . HYPERTENSION 08/27/2008   pt is currently on no meds   . Pneumonia    hx of   . PONV (postoperative nausea and vomiting)   . Rheumatoid arthritis ([place])   . SEBORRHEIC KERATOSIS 08/27/2008  . Vocal cord paresis    hx of     Past Surgical History:  Procedure Laterality Date  . ABDOMINAL HYSTERECTOMY  1995  . APPENDECTOMY    . bilateral shoulder replacements     . BREAST BIOPSY Left   . BREAST DUCTAL SYSTEM EXCISION  08/16/2011   Procedure: EXCISION DUCTAL SYSTEM BREAST;  Surgeon: Joyice Faster. Cornett, MD;  Location: [facility];  Service: General;  Laterality:  Left;  left breast duct excision  . [place]   times 2; 1980 and 1982  . [place]  . CHOLECYSTECTOMY  1997  . EXCISION/RELEASE BURSA HIP Right 10/21/2014   Procedure: OPEN RIGHT HIP BURSECTOMY, EXOSECTOMY;  Surgeon: Paralee Cancel, MD;  Location: [facility];  Service: Orthopedics;  Laterality: Right;  . foot surgery  2004, 2005, 2008   bilat secondary to neuropathy   . JOINT REPLACEMENT  10,12   lt total knee X2  . KNEE ARTHROSCOPY  2007, 2008, 2009  . LAPAROSCOPIC GASTRIC SLEEVE RESECTION N/A 10/30/2013   Procedure: LAPAROSCOPIC GASTRIC SLEEVE RESECTION WITH HIATAL HERNIA REPAIR AND UPPER ENDOSCOPY;  Surgeon: Greer Pickerel, MD;  Location: [facility];  Service: General;  Laterality: N/A;  . NASAL SINUS SURGERY  2013  . OPEN SURGICAL REPAIR OF GLUTEAL TENDON  01/17/2012   Procedure: OPEN SURGICAL REPAIR OF GLUTEAL TENDON;  Surgeon: Mauri Pole, MD;  Location: [facility];  Service: Orthopedics;  Laterality: Right;  . TOTAL KNEE ARTHROPLASTY Right 04/15/2020   Procedure: TOTAL KNEE ARTHROPLASTY;  Surgeon: Paralee Cancel, MD;  Location: [facility];  Service: Orthopedics;  Laterality: Right;  70 mins    There were no vitals filed for this visit.   Subjective Assessment - 05/06/20 1315    Subjective COVID-19 screen performed prior to patient entering clinic.  Patient arrives with 3/10 pain today in R knee.    Pertinent History Left TKA, RA, Fibromyalgia, HTN.    How long can you sit comfortably? One hour+.    How long can you stand comfortably? Less than 5 minutes.    How long can you walk comfortably? Between room sin home with a FWW.    Patient Stated Goals Get out of pain.    Currently in Pain? Yes    Pain Score 3     Pain Location Knee    Pain Orientation Right    Pain Descriptors / Indicators Aching;Sore;Throbbing    Pain Type Surgical pain    Pain Onset More than a month ago    Pain Frequency Constant              [facility] PT Assessment - 05/06/20 0001      Assessment    Medical Diagnosis right total knee replacement.    Referring Provider (PT) Paralee Cancel MD    Onset Date/Surgical Date 04/15/20      Precautions   Precaution Comments No ultrasound.                         [place] Adult PT Treatment/Exercise - 05/06/20 0001      Exercises   Exercises Knee/Hip      Knee/Hip Exercises: Aerobic   Nustep level 1 x15 mins seat 10 to 9      Knee/Hip Exercises: Standing   Hip Flexion AROM;Both;2 sets;10 reps;Knee bent   x10 with L; 2x10 with R   Hip Abduction AROM;Both;2 sets;10 reps;Knee straight   x10 L; 2x10 R     Knee/Hip Exercises: Seated   Long Arc Quad AROM;Right;2 sets;10 reps      Knee/Hip Exercises: Supine   Short Arc Quad Sets AROM;Right;2 sets;10 reps      Modalities   Modalities Vasopneumatic      Vasopneumatic   Number Minutes Vasopneumatic  15 minutes    Vasopnuematic Location  Knee    Vasopneumatic Pressure Low    Vasopneumatic Temperature  34      Manual Therapy   Manual Therapy Passive ROM    Passive ROM Supine PROM into R knee flexion extension with intermittent oscillations to decrease pain                       PT Long Term Goals - 04/22/20 1638      PT LONG TERM GOAL #1   Title Patient will be independent with HEP    Time 4    Status New      PT LONG TERM GOAL #2   Title Active right knee flexion to 110-115 degrees+ so the patient can perform functional tasks and do so with pain not > 2-3/10.    Time 4    Period Weeks    Status New      PT LONG TERM GOAL #3   Title Full right active knee extension in order to normalize gait.    Time 4    Period Weeks    Status New      PT LONG TERM GOAL #4   Title Increase right hip and knee strength to a solid 4+/5 to provide  good stability for accomplishment of functional activities.    Time 4    Period Weeks    Status New      PT LONG TERM GOAL #5   Title Walk 200 feet with straight cane.    Time 4    Period Weeks    Status New                  Plan - 05/06/20 1348    Clinical Impression Statement Patient arrives with mild right knee pain. Patient was able to tolerate progression of standing TEs but did report soreness with R LE standing. Intermittent oscillations provided to decrease pain during R knee PROM in supine. Patient in supine position with wedge under LEs for modalities with adverse affects upon removal.    Personal Factors and Comorbidities Comorbidity 1;Comorbidity 2;Other    Comorbidities Left TKA, RA, Fibromyalgia, HTN.    Examination-Activity Limitations Other;Locomotion Level    Examination-Participation Restrictions Other    Stability/Clinical Decision Making Stable/Uncomplicated    Clinical Decision Making Low    Rehab Potential Good    PT Frequency 3x / week    PT Duration 4 weeks    PT Treatment/Interventions ADLs/Self Care Home Management;Cryotherapy;Electrical Stimulation;Moist Heat;Neuromuscular re-education;Therapeutic exercise;Therapeutic activities;Patient/family education;Manual techniques;Vasopneumatic Device    PT Next Visit Plan Level 1 Nustep, PROM to patient's right knee.  Vasopneumatic seated with wedge and pillows under right knee.    Consulted and Agree with Plan of Care Patient           Patient will benefit from skilled therapeutic intervention in order to improve the following deficits and impairments:  Pain,Decreased activity tolerance,Decreased strength,Difficulty walking  Visit Diagnosis: Chronic pain of right knee  Stiffness of right knee, not elsewhere classified  Localized edema     Problem List Patient Active Problem List   Diagnosis Date Noted  . Obese 04/16/2020  . Osteoarthritis of right knee 04/16/2020  . S/P total knee arthroplasty, right 04/15/2020  . Pulmonary nodules 12/05/2019  . Bilateral occipital neuralgia 09/26/2017  . Status post left foot surgery 07/07/2015  . Neuroma 05/20/2015  . HAV (hallux abducto valgus) 05/20/2015  . Hammertoe  05/20/2015  . Prominent metatarsal head 05/20/2015  . Chronic foot pain 05/20/2015  . Anxiety state 03/03/2015  . Hip bursitis 10/22/2014  . Osteophyte of hip 10/21/2014  . Epigastric cramping 11/09/2013  . Nausea with vomiting 11/06/2013  . Rheumatoid arthritis ([place]) 11/01/2013  . S/P laparoscopic sleeve gastrectomy 10/30/2013  . Anemia 06/04/2013  . CAP (community acquired pneumonia) 02/17/2013  . Restless leg syndrome 09/18/2012  . Morbid obesity with BMI of 45.0-49.9, adult ([place]) 01/18/2012  . Right gluteus tear 01/17/2012  . Nipple discharge in female 07/08/2011  . Chronic insomnia 06/18/2011  . Discharge from nipple 01/06/2011  . TMJ PAIN 01/12/2010  . DEPRESSION 12/10/2009  . SINUSITIS, ACUTE 12/30/2008  . Lumbago 12/30/2008  . EDEMA 10/03/2008  . Hyperlipidemia 08/27/2008  . Essential hypertension 08/27/2008  . GERD 08/27/2008    Gabriela Eves, PT, DPT 05/06/2020, 1:59 PM  [facility] [address] [place], [place], [address] Phone: [phone]   Fax:  [phone]  Name: Lunna Vogelgesang MRN: [id number] Date of Birth: Dec 12, 1955

## 2020-05-08 ENCOUNTER — Ambulatory Visit: Payer: Medicare Other | Admitting: Physical Therapy

## 2020-05-08 ENCOUNTER — Encounter: Payer: Self-pay | Admitting: Physical Therapy

## 2020-05-08 ENCOUNTER — Other Ambulatory Visit: Payer: Self-pay

## 2020-05-08 DIAGNOSIS — G8929 Other chronic pain: Secondary | ICD-10-CM | POA: Diagnosis not present

## 2020-05-08 DIAGNOSIS — M25661 Stiffness of right knee, not elsewhere classified: Secondary | ICD-10-CM

## 2020-05-08 DIAGNOSIS — M25561 Pain in right knee: Secondary | ICD-10-CM | POA: Diagnosis not present

## 2020-05-08 DIAGNOSIS — M6281 Muscle weakness (generalized): Secondary | ICD-10-CM

## 2020-05-08 DIAGNOSIS — R600 Localized edema: Secondary | ICD-10-CM

## 2020-05-08 NOTE — Therapy (Signed)
[facility] [place], [place], [address] Phone: [phone]   Fax:  [phone]  Physical Therapy Treatment  Patient Details  Name: [name] MRN: [id number] Date of Birth: Sep 14, 1955 Referring Provider (PT): Paralee Cancel MD   Encounter Date: 05/08/2020   PT End of Session - 05/08/20 1313    Visit Number 5    Number of Visits 12    Date for PT Re-Evaluation 05/20/20    Authorization Type FOTO AT LEAST EVERY 5TH VISIT.  PROGRESS NOTE AT 10TH VISIT.  KX MODIFIER AFTER 15 VISITS.    PT Start Time 1302    PT Stop Time 1347    PT Time Calculation (min) 45 min    Activity Tolerance Patient tolerated treatment well    Behavior During Therapy WFL for tasks assessed/performed           Past Medical History:  Diagnosis Date  . ANGIOMA 10/03/2008   REMOVED FROM RIGHT LOWER LEG-BENIGN  . Anxiety   . Aortic atherosclerosis ([place])   . Arthritis    RA  . Bronchitis    hx of   . Cancer (HCC)    BASAL CELL SKIN CANCER  . Complication of anesthesia   . Coronary artery disease   . Depression   . Fibromyalgia   . GERD 08/27/2008  . History of blood transfusion    1995  . History of hiatal hernia    hx of has had surgically repaired  . History of measles   . History of mumps   . History of nonmelanoma skin cancer   . HYPERLIPIDEMIA 08/27/2008  . HYPERTENSION 08/27/2008   pt is currently on no meds   . Pneumonia    hx of   . PONV (postoperative nausea and vomiting)   . Rheumatoid arthritis ([place])   . SEBORRHEIC KERATOSIS 08/27/2008  . Vocal cord paresis    hx of     Past Surgical History:  Procedure Laterality Date  . ABDOMINAL HYSTERECTOMY  1995  . APPENDECTOMY    . bilateral shoulder replacements     . BREAST BIOPSY Left   . BREAST DUCTAL SYSTEM EXCISION  08/16/2011   Procedure: EXCISION DUCTAL SYSTEM BREAST;  Surgeon: Joyice Faster. Cornett, MD;  Location: [facility];  Service: General;  Laterality:  Left;  left breast duct excision  . [place]   times 2; 1980 and 1982  . [place]  . CHOLECYSTECTOMY  1997  . EXCISION/RELEASE BURSA HIP Right 10/21/2014   Procedure: OPEN RIGHT HIP BURSECTOMY, EXOSECTOMY;  Surgeon: Paralee Cancel, MD;  [facility];  Service: Orthopedics;  Laterality: Right;  . foot surgery  2004, 2005, 2008   bilat secondary to neuropathy   . JOINT REPLACEMENT  10,12   lt total knee X2  . KNEE ARTHROSCOPY  2007, 2008, 2009  . LAPAROSCOPIC GASTRIC SLEEVE RESECTION N/A 10/30/2013   Procedure: LAPAROSCOPIC GASTRIC SLEEVE RESECTION WITH HIATAL HERNIA REPAIR AND UPPER ENDOSCOPY;  Surgeon: Greer Pickerel, MD;  [facility];  Service: General;  Laterality: N/A;  . NASAL SINUS SURGERY  2013  . OPEN SURGICAL REPAIR OF GLUTEAL TENDON  01/17/2012   Procedure: OPEN SURGICAL REPAIR OF GLUTEAL TENDON;  Surgeon: Mauri Pole, MD;  [facility];  Service: Orthopedics;  Laterality: Right;  . TOTAL KNEE ARTHROPLASTY Right 04/15/2020   Procedure: TOTAL KNEE ARTHROPLASTY;  Surgeon: Paralee Cancel, MD;  Location[facility];  Service: Orthopedics;  Laterality: Right;  70 mins    There were no vitals filed for this visit.   Subjective Assessment - 05/08/20 1312    Subjective COVID-19 screen performed prior to patient entering clinic.  Patient arrives with more aching today.    Pertinent History Left TKA, RA, Fibromyalgia, HTN.    How long can you sit comfortably? One hour+.    How long can you stand comfortably? Less than 5 minutes.    How long can you walk comfortably? Between room sin home with a FWW.    Patient Stated Goals Get out of pain.    Currently in Pain? Yes    Pain Score 5     Pain Location Knee    Pain Orientation Right    Pain Descriptors / Indicators Dull;Aching    Pain Type Surgical pain    Pain Onset More than a month ago    Pain Frequency Constant              [facility] PT Assessment - 05/08/20 0001      Assessment   Medical  Diagnosis right total knee replacement.    Referring Provider (PT) Paralee Cancel MD    Onset Date/Surgical Date 04/15/20    Hand Dominance Right    Next MD Visit 05/27/2020      Precautions   Precaution Comments No ultrasound.      Restrictions   Weight Bearing Restrictions No                         [facility] Adult PT Treatment/Exercise - 05/08/20 0001      Knee/Hip Exercises: Aerobic   Nustep L3, seat 6 x15 min for ROM      Knee/Hip Exercises: Standing   Hip Flexion AROM;Right;2 sets;10 reps;Knee bent    Forward Lunges Right;15 reps;3 seconds    Hip Abduction AROM;Right;2 sets;10 reps;Knee straight    Rocker Board 2 minutes      Knee/Hip Exercises: Seated   Long Arc Quad AROM;Right;2 sets;10 reps      Modalities   Modalities Vasopneumatic      Vasopneumatic   Number Minutes Vasopneumatic  15 minutes    Vasopnuematic Location  Knee    Vasopneumatic Pressure Low    Vasopneumatic Temperature  34                       PT Long Term Goals - 04/22/20 1638      PT LONG TERM GOAL #1   Title Patient will be independent with HEP    Time 4    Status New      PT LONG TERM GOAL #2   Title Active right knee flexion to 110-115 degrees+ so the patient can perform functional tasks and do so with pain not > 2-3/10.    Time 4    Period Weeks    Status New      PT LONG TERM GOAL #3   Title Full right active knee extension in order to normalize gait.    Time 4    Period Weeks    Status New      PT LONG TERM GOAL #4   Title Increase right hip and knee strength to a solid 4+/5 to provide good stability for accomplishment of functional activities.    Time 4    Period Weeks    Status New      PT LONG TERM GOAL #5  Title Walk 200 feet with straight cane.    Time 4    Period Weeks    Status New                 Plan - 05/08/20 1352    Clinical Impression Statement Patient progressed in clinic for more AROM of R knee. Patient reported tightness  of R calf and also soreness of R quad with LAQ. Patient reports trying to do more around her home and cook some but sat as much as she could for cooking. Patient able to independently lift LEs to transition from sitting to supine. Normal vasopneumatic response noted following removal of the modality.    Personal Factors and Comorbidities Comorbidity 1;Comorbidity 2;Other    Comorbidities Left TKA, RA, Fibromyalgia, HTN.    Examination-Activity Limitations Other;Locomotion Level    Examination-Participation Restrictions Other    Stability/Clinical Decision Making Stable/Uncomplicated    Rehab Potential Good    PT Frequency 3x / week    PT Duration 4 weeks    PT Treatment/Interventions ADLs/Self Care Home Management;Cryotherapy;Electrical Stimulation;Moist Heat;Neuromuscular re-education;Therapeutic exercise;Therapeutic activities;Patient/family education;Manual techniques;Vasopneumatic Device    PT Next Visit Plan Level 1 Nustep, PROM to patient's right knee.  Vasopneumatic seated with wedge and pillows under right knee.    Consulted and Agree with Plan of Care Patient           Patient will benefit from skilled therapeutic intervention in order to improve the following deficits and impairments:  Pain,Decreased activity tolerance,Decreased strength,Difficulty walking  Visit Diagnosis: Chronic pain of right knee  Stiffness of right knee, not elsewhere classified  Localized edema  Muscle weakness (generalized)     Problem List Patient Active Problem List   Diagnosis Date Noted  . Obese 04/16/2020  . Osteoarthritis of right knee 04/16/2020  . S/P total knee arthroplasty, right 04/15/2020  . Pulmonary nodules 12/05/2019  . Bilateral occipital neuralgia 09/26/2017  . Status post left foot surgery 07/07/2015  . Neuroma 05/20/2015  . HAV (hallux abducto valgus) 05/20/2015  . Hammertoe 05/20/2015  . Prominent metatarsal head 05/20/2015  . Chronic foot pain 05/20/2015  . Anxiety  state 03/03/2015  . Hip bursitis 10/22/2014  . Osteophyte of hip 10/21/2014  . Epigastric cramping 11/09/2013  . Nausea with vomiting 11/06/2013  . Rheumatoid arthritis ([place]) 11/01/2013  . S/P laparoscopic sleeve gastrectomy 10/30/2013  . Anemia 06/04/2013  . CAP (community acquired pneumonia) 02/17/2013  . Restless leg syndrome 09/18/2012  . Morbid obesity with BMI of 45.0-49.9, adult ([place]) 01/18/2012  . Right gluteus tear 01/17/2012  . Nipple discharge in female 07/08/2011  . Chronic insomnia 06/18/2011  . Discharge from nipple 01/06/2011  . TMJ PAIN 01/12/2010  . DEPRESSION 12/10/2009  . SINUSITIS, ACUTE 12/30/2008  . Lumbago 12/30/2008  . EDEMA 10/03/2008  . Hyperlipidemia 08/27/2008  . Essential hypertension 08/27/2008  . GERD 08/27/2008    Standley Brooking, PTA 05/08/2020, 1:57 PM  [facility] [address] [place], [place], [address] Phone: [phone]   Fax:  [phone]  Name: [name] MRN: [id number] Date of Birth: November 20, 1955

## 2020-05-12 ENCOUNTER — Ambulatory Visit: Payer: Medicare Other | Admitting: Physical Therapy

## 2020-05-12 ENCOUNTER — Other Ambulatory Visit: Payer: Self-pay

## 2020-05-12 DIAGNOSIS — G8929 Other chronic pain: Secondary | ICD-10-CM

## 2020-05-12 DIAGNOSIS — R600 Localized edema: Secondary | ICD-10-CM | POA: Diagnosis not present

## 2020-05-12 DIAGNOSIS — M6281 Muscle weakness (generalized): Secondary | ICD-10-CM | POA: Diagnosis not present

## 2020-05-12 DIAGNOSIS — M25661 Stiffness of right knee, not elsewhere classified: Secondary | ICD-10-CM | POA: Diagnosis not present

## 2020-05-12 DIAGNOSIS — M25561 Pain in right knee: Secondary | ICD-10-CM | POA: Diagnosis not present

## 2020-05-12 NOTE — Therapy (Signed)
[facility] [place], [place], [address] Phone: [phone]   Fax:  [phone]  Physical Therapy Treatment  Patient Details  Name: [name] MRN: [id number] Date of Birth: 01/29/1956 Referring Provider (PT): Paralee Cancel MD   Encounter Date: 05/12/2020   PT End of Session - 05/12/20 1358    Visit Number 6    Number of Visits 12    Date for PT Re-Evaluation 05/20/20    Authorization Type FOTO 6th 62 score  PROGRESS NOTE AT 10TH VISIT.  KX MODIFIER AFTER 15 VISITS.    PT Start Time 0145    PT Stop Time 0233    PT Time Calculation (min) 48 min    Activity Tolerance Patient tolerated treatment well    Behavior During Therapy [facility] for tasks assessed/performed           Past Medical History:  Diagnosis Date  . ANGIOMA 10/03/2008   REMOVED FROM RIGHT LOWER LEG-BENIGN  . Anxiety   . Aortic atherosclerosis ([place])   . Arthritis    RA  . Bronchitis    hx of   . Cancer (HCC)    BASAL CELL SKIN CANCER  . Complication of anesthesia   . Coronary artery disease   . Depression   . Fibromyalgia   . GERD 08/27/2008  . History of blood transfusion    1995  . History of hiatal hernia    hx of has had surgically repaired  . History of measles   . History of mumps   . History of nonmelanoma skin cancer   . HYPERLIPIDEMIA 08/27/2008  . HYPERTENSION 08/27/2008   pt is currently on no meds   . Pneumonia    hx of   . PONV (postoperative nausea and vomiting)   . Rheumatoid arthritis ([place])   . SEBORRHEIC KERATOSIS 08/27/2008  . Vocal cord paresis    hx of     Past Surgical History:  Procedure Laterality Date  . ABDOMINAL HYSTERECTOMY  1995  . APPENDECTOMY    . bilateral shoulder replacements     . BREAST BIOPSY Left   . BREAST DUCTAL SYSTEM EXCISION  08/16/2011   Procedure: EXCISION DUCTAL SYSTEM BREAST;  Surgeon: Joyice Faster. Cornett, MD;  Location: [place];  Service: General;  Laterality: Left;  left  breast duct excision  . [place]   times 2; 1980 and 1982  . [place]  . CHOLECYSTECTOMY  1997  . EXCISION/RELEASE BURSA HIP Right 10/21/2014   Procedure: OPEN RIGHT HIP BURSECTOMY, EXOSECTOMY;  Surgeon: Paralee Cancel, MD;  [facility];  Service: Orthopedics;  Laterality: Right;  . foot surgery  2004, 2005, 2008   bilat secondary to neuropathy   . JOINT REPLACEMENT  10,12   lt total knee X2  . KNEE ARTHROSCOPY  2007, 2008, 2009  . LAPAROSCOPIC GASTRIC SLEEVE RESECTION N/A 10/30/2013   Procedure: LAPAROSCOPIC GASTRIC SLEEVE RESECTION WITH HIATAL HERNIA REPAIR AND UPPER ENDOSCOPY;  Surgeon: Greer Pickerel, MD;  [facility];  Service: General;  Laterality: N/A;  . NASAL SINUS SURGERY  2013  . OPEN SURGICAL REPAIR OF GLUTEAL TENDON  01/17/2012   Procedure: OPEN SURGICAL REPAIR OF GLUTEAL TENDON;  Surgeon: Mauri Pole, MD;  [facility];  Service: Orthopedics;  Laterality: Right;  . TOTAL KNEE ARTHROPLASTY Right 04/15/2020   Procedure: TOTAL KNEE ARTHROPLASTY;  Surgeon: Paralee Cancel, MD;  [facility];  Service: Orthopedics;  Laterality: Right;  70 mins    There were no vitals filed for this visit.   Subjective Assessment - 05/12/20 1354    Subjective COVID-19 screen performed prior to patient entering clinic.  Patient arrives with some pain today in knee    Pertinent History Left TKA, RA, Fibromyalgia, HTN.    How long can you sit comfortably? One hour+.    How long can you stand comfortably? Less than 5 minutes.    How long can you walk comfortably? Between room sin home with a FWW.    Patient Stated Goals Get out of pain.    Currently in Pain? Yes    Pain Score 5     Pain Location Knee    Pain Orientation Right    Pain Descriptors / Indicators Discomfort    Pain Type Surgical pain    Pain Onset More than a month ago    Pain Frequency Constant    Aggravating Factors  ROM    Pain Relieving Factors rest              [facility] PT  Assessment - 05/12/20 0001      ROM / Strength   AROM / PROM / Strength AROM;PROM      AROM   AROM Assessment Site Knee    Right/Left Knee Right    Right Knee Extension -10    Right Knee Flexion 98      PROM   PROM Assessment Site Knee    Right/Left Knee Right    Right Knee Extension -6    Right Knee Flexion 106                         [facility] Adult PT Treatment/Exercise - 05/12/20 0001      Knee/Hip Exercises: Aerobic   Nustep L4 x27min adjusted for ROM      Vasopneumatic   Number Minutes Vasopneumatic  15 minutes    Vasopnuematic Location  Knee    Vasopneumatic Pressure Low    Vasopneumatic Temperature  34      Manual Therapy   Manual Therapy Passive ROM    Passive ROM manual PROM for right knee flexion and ext to improve mobility                       PT Long Term Goals - 05/12/20 1355      PT LONG TERM GOAL #1   Title Patient will be independent with HEP    Time 4    Period Weeks    Status On-going      PT LONG TERM GOAL #2   Title Active right knee flexion to 110-115 degrees+ so the patient can perform functional tasks and do so with pain not > 2-3/10.    Baseline AROM 98 degrees 05/12/20    Time 4    Period Weeks    Status On-going      PT LONG TERM GOAL #3   Title Full right active knee extension in order to normalize gait.    Baseline AROM -10 degrees 05/12/20    Time 4    Period Weeks    Status On-going      PT LONG TERM GOAL #4   Title Increase right hip and knee strength to a solid 4+/5 to provide good stability for accomplishment of functional activities.    Time 4    Period Weeks    Status On-going  PT LONG TERM GOAL #5   Title Walk 200 feet with straight cane.    Time 4    Period Weeks    Status On-going                 Plan - 05/12/20 1422    Clinical Impression Statement Patient tolerated treatment well today. Today focused on ROM to improve moility. Patient improved with ROM for flexion and ext  today. Patient feels overall progress and doing well with progression. Patient current goals progressing today.    Personal Factors and Comorbidities Comorbidity 1;Comorbidity 2;Other    Comorbidities Left TKA, RA, Fibromyalgia, HTN.    Examination-Activity Limitations Other;Locomotion Level    Examination-Participation Restrictions Other    Stability/Clinical Decision Making Stable/Uncomplicated    Rehab Potential Good    PT Frequency 3x / week    PT Duration 4 weeks    PT Treatment/Interventions ADLs/Self Care Home Management;Cryotherapy;Electrical Stimulation;Moist Heat;Neuromuscular re-education;Therapeutic exercise;Therapeutic activities;Patient/family education;Manual techniques;Vasopneumatic Device    PT Next Visit Plan cont with POC for PROM and PRE's / Vasopneumatic    Consulted and Agree with Plan of Care Patient           Patient will benefit from skilled therapeutic intervention in order to improve the following deficits and impairments:  Pain,Decreased activity tolerance,Decreased strength,Difficulty walking  Visit Diagnosis: Chronic pain of right knee  Stiffness of right knee, not elsewhere classified  Localized edema  Muscle weakness (generalized)     Problem List Patient Active Problem List   Diagnosis Date Noted  . Obese 04/16/2020  . Osteoarthritis of right knee 04/16/2020  . S/P total knee arthroplasty, right 04/15/2020  . Pulmonary nodules 12/05/2019  . Bilateral occipital neuralgia 09/26/2017  . Status post left foot surgery 07/07/2015  . Neuroma 05/20/2015  . HAV (hallux abducto valgus) 05/20/2015  . Hammertoe 05/20/2015  . Prominent metatarsal head 05/20/2015  . Chronic foot pain 05/20/2015  . Anxiety state 03/03/2015  . Hip bursitis 10/22/2014  . Osteophyte of hip 10/21/2014  . Epigastric cramping 11/09/2013  . Nausea with vomiting 11/06/2013  . Rheumatoid arthritis ([place]) 11/01/2013  . S/P laparoscopic sleeve gastrectomy 10/30/2013  . Anemia  06/04/2013  . CAP (community acquired pneumonia) 02/17/2013  . Restless leg syndrome 09/18/2012  . Morbid obesity with BMI of 45.0-49.9, adult ([place]) 01/18/2012  . Right gluteus tear 01/17/2012  . Nipple discharge in female 07/08/2011  . Chronic insomnia 06/18/2011  . Discharge from nipple 01/06/2011  . TMJ PAIN 01/12/2010  . DEPRESSION 12/10/2009  . SINUSITIS, ACUTE 12/30/2008  . Lumbago 12/30/2008  . EDEMA 10/03/2008  . Hyperlipidemia 08/27/2008  . Essential hypertension 08/27/2008  . GERD 08/27/2008    Phillips Climes, PTA 05/12/2020, 2:36 PM  [facility] [address] [place], [place], [address] Phone: [phone]   Fax:  [phone]  Name: Amani Marseille MRN: [id number] Date of Birth: 1956/02/22

## 2020-05-13 DIAGNOSIS — K449 Diaphragmatic hernia without obstruction or gangrene: Secondary | ICD-10-CM | POA: Diagnosis not present

## 2020-05-13 DIAGNOSIS — J383 Other diseases of vocal cords: Secondary | ICD-10-CM | POA: Diagnosis not present

## 2020-05-13 DIAGNOSIS — K219 Gastro-esophageal reflux disease without esophagitis: Secondary | ICD-10-CM | POA: Diagnosis not present

## 2020-05-13 DIAGNOSIS — J3801 Paralysis of vocal cords and larynx, unilateral: Secondary | ICD-10-CM | POA: Diagnosis not present

## 2020-05-13 DIAGNOSIS — J385 Laryngeal spasm: Secondary | ICD-10-CM | POA: Diagnosis not present

## 2020-05-13 DIAGNOSIS — Z87891 Personal history of nicotine dependence: Secondary | ICD-10-CM | POA: Diagnosis not present

## 2020-05-13 DIAGNOSIS — M069 Rheumatoid arthritis, unspecified: Secondary | ICD-10-CM | POA: Diagnosis not present

## 2020-05-13 DIAGNOSIS — R490 Dysphonia: Secondary | ICD-10-CM | POA: Diagnosis not present

## 2020-05-15 ENCOUNTER — Other Ambulatory Visit: Payer: Self-pay

## 2020-05-15 ENCOUNTER — Ambulatory Visit: Payer: Medicare Other | Admitting: Physical Therapy

## 2020-05-15 DIAGNOSIS — G8929 Other chronic pain: Secondary | ICD-10-CM | POA: Diagnosis not present

## 2020-05-15 DIAGNOSIS — M25661 Stiffness of right knee, not elsewhere classified: Secondary | ICD-10-CM | POA: Diagnosis not present

## 2020-05-15 DIAGNOSIS — M6281 Muscle weakness (generalized): Secondary | ICD-10-CM | POA: Diagnosis not present

## 2020-05-15 DIAGNOSIS — M25561 Pain in right knee: Secondary | ICD-10-CM | POA: Diagnosis not present

## 2020-05-15 DIAGNOSIS — R600 Localized edema: Secondary | ICD-10-CM | POA: Diagnosis not present

## 2020-05-15 NOTE — Therapy (Signed)
[facility] [place], [place], [address] Phone: [phone]   Fax:  [phone]  Physical Therapy Treatment  Patient Details  Name: [name] MRN: [id number] Date of Birth: [birth year] Referring Provider (PT): Paralee Cancel MD   Encounter Date: 05/15/2020   PT End of Session - 05/15/20 1341    Visit Number 7    Number of Visits 12    Date for PT Re-Evaluation 05/20/20    Authorization Type FOTO 6th 62 score  PROGRESS NOTE AT 10TH VISIT.  KX MODIFIER AFTER 15 VISITS.    PT Start Time 0108    PT Stop Time 0155    PT Time Calculation (min) 47 min    Activity Tolerance Patient tolerated treatment well    Behavior During Therapy [facility] for tasks assessed/performed           Past Medical History:  Diagnosis Date  . ANGIOMA 10/03/2008   REMOVED FROM RIGHT LOWER LEG-BENIGN  . Anxiety   . Aortic atherosclerosis ([place])   . Arthritis    RA  . Bronchitis    hx of   . Cancer (HCC)    BASAL CELL SKIN CANCER  . Complication of anesthesia   . Coronary artery disease   . Depression   . Fibromyalgia   . GERD 08/27/2008  . History of blood transfusion    1995  . History of hiatal hernia    hx of has had surgically repaired  . History of measles   . History of mumps   . History of nonmelanoma skin cancer   . HYPERLIPIDEMIA 08/27/2008  . HYPERTENSION 08/27/2008   pt is currently on no meds   . Pneumonia    hx of   . PONV (postoperative nausea and vomiting)   . Rheumatoid arthritis ([place])   . SEBORRHEIC KERATOSIS 08/27/2008  . Vocal cord paresis    hx of     Past Surgical History:  Procedure Laterality Date  . ABDOMINAL HYSTERECTOMY  1995  . APPENDECTOMY    . bilateral shoulder replacements     . BREAST BIOPSY Left   . BREAST DUCTAL SYSTEM EXCISION  08/16/2011   Procedure: EXCISION DUCTAL SYSTEM BREAST;  Surgeon: Joyice Faster. Cornett, MD;  Location: [place];  Service: General;  Laterality: Left;  left  breast duct excision  . [place]   times 2; 1980 and 1982  . [place]  . CHOLECYSTECTOMY  1997  . EXCISION/RELEASE BURSA HIP Right 10/21/2014   Procedure: OPEN RIGHT HIP BURSECTOMY, EXOSECTOMY;  Surgeon: Paralee Cancel, MD;  [facility];  Service: Orthopedics;  Laterality: Right;  . foot surgery  2004, 2005, 2008   bilat secondary to neuropathy   . JOINT REPLACEMENT  10,12   lt total knee X2  . KNEE ARTHROSCOPY  2007, 2008, 2009  . LAPAROSCOPIC GASTRIC SLEEVE RESECTION N/A 10/30/2013   Procedure: LAPAROSCOPIC GASTRIC SLEEVE RESECTION WITH HIATAL HERNIA REPAIR AND UPPER ENDOSCOPY;  Surgeon: Greer Pickerel, MD;  [facility];  Service: General;  Laterality: N/A;  . NASAL SINUS SURGERY  2013  . OPEN SURGICAL REPAIR OF GLUTEAL TENDON  01/17/2012   Procedure: OPEN SURGICAL REPAIR OF GLUTEAL TENDON;  Surgeon: Mauri Pole, MD;  [facility];  Service: Orthopedics;  Laterality: Right;  . TOTAL KNEE ARTHROPLASTY Right 04/15/2020   Procedure: TOTAL KNEE ARTHROPLASTY;  Surgeon: Paralee Cancel, MD;  [facility];  Service: Orthopedics;  Laterality: Right;  70 mins    There were no vitals filed for this visit.   Subjective Assessment - 05/15/20 1340    Subjective COVID-19 screen performed prior to patient entering clinic.  Doing good.    Currently in Pain? Yes    Pain Score 5          Treatment:  Nustep x 17 minutes f/b PROM to patient's right knee in supine x 6 minutes f/b Vaso on low x 15 minutes.                                 PT Long Term Goals - 05/12/20 1355      PT LONG TERM GOAL #1   Title Patient will be independent with HEP    Time 4    Period Weeks    Status On-going      PT LONG TERM GOAL #2   Title Active right knee flexion to 110-115 degrees+ so the patient can perform functional tasks and do so with pain not > 2-3/10.    Baseline AROM 98 degrees 05/12/20    Time 4    Period Weeks    Status On-going       PT LONG TERM GOAL #3   Title Full right active knee extension in order to normalize gait.    Baseline AROM -10 degrees 05/12/20    Time 4    Period Weeks    Status On-going      PT LONG TERM GOAL #4   Title Increase right hip and knee strength to a solid 4+/5 to provide good stability for accomplishment of functional activities.    Time 4    Period Weeks    Status On-going      PT LONG TERM GOAL #5   Title Walk 200 feet with straight cane.    Time 4    Period Weeks    Status On-going                 Plan - 05/15/20 1411    Clinical Impression Statement Pain consistently lower and right knee range of motion improving nicely.    Personal Factors and Comorbidities Comorbidity 1;Comorbidity 2;Other    Comorbidities Left TKA, RA, Fibromyalgia, HTN.    Examination-Activity Limitations Other;Locomotion Level    Examination-Participation Restrictions Other    Stability/Clinical Decision Making Stable/Uncomplicated    Rehab Potential Good    PT Frequency 3x / week    PT Duration 4 weeks    PT Treatment/Interventions ADLs/Self Care Home Management;Cryotherapy;Electrical Stimulation;Moist Heat;Neuromuscular re-education;Therapeutic exercise;Therapeutic activities;Patient/family education;Manual techniques;Vasopneumatic Device    PT Next Visit Plan cont with POC for PROM and PRE's / Vasopneumatic    Consulted and Agree with Plan of Care Patient           Patient will benefit from skilled therapeutic intervention in order to improve the following deficits and impairments:  Pain,Decreased activity tolerance,Decreased strength,Difficulty walking  Visit Diagnosis: Chronic pain of right knee     Problem List Patient Active Problem List   Diagnosis Date Noted  . Obese 04/16/2020  . Osteoarthritis of right knee 04/16/2020  . S/P total knee arthroplasty, right 04/15/2020  . Pulmonary nodules 12/05/2019  . Bilateral occipital neuralgia 09/26/2017  . Status post left foot  surgery 07/07/2015  . Neuroma 05/20/2015  . HAV (hallux abducto valgus) 05/20/2015  . Hammertoe 05/20/2015  . Prominent metatarsal head 05/20/2015  . Chronic foot  pain 05/20/2015  . Anxiety state 03/03/2015  . Hip bursitis 10/22/2014  . Osteophyte of hip 10/21/2014  . Epigastric cramping 11/09/2013  . Nausea with vomiting 11/06/2013  . Rheumatoid arthritis ([place]) 11/01/2013  . S/P laparoscopic sleeve gastrectomy 10/30/2013  . Anemia 06/04/2013  . CAP (community acquired pneumonia) 02/17/2013  . Restless leg syndrome 09/18/2012  . Morbid obesity with BMI of 45.0-49.9, adult ([place]) 01/18/2012  . Right gluteus tear 01/17/2012  . Nipple discharge in female 07/08/2011  . Chronic insomnia 06/18/2011  . Discharge from nipple 01/06/2011  . TMJ PAIN 01/12/2010  . DEPRESSION 12/10/2009  . SINUSITIS, ACUTE 12/30/2008  . Lumbago 12/30/2008  . EDEMA 10/03/2008  . Hyperlipidemia 08/27/2008  . Essential hypertension 08/27/2008  . GERD 08/27/2008    [name], [name] 05/15/2020, 2:13 PM  [facility] [address] [place], [place], [address] Phone: [phone]   Fax:  [phone]  Name: [name] MRN: [id number] Date of Birth: 1955-06-10

## 2020-05-20 ENCOUNTER — Other Ambulatory Visit: Payer: Self-pay

## 2020-05-20 ENCOUNTER — Ambulatory Visit: Payer: Medicare Other | Admitting: Physical Therapy

## 2020-05-20 ENCOUNTER — Encounter: Payer: Self-pay | Admitting: Physical Therapy

## 2020-05-20 DIAGNOSIS — G8929 Other chronic pain: Secondary | ICD-10-CM | POA: Diagnosis not present

## 2020-05-20 DIAGNOSIS — M25661 Stiffness of right knee, not elsewhere classified: Secondary | ICD-10-CM

## 2020-05-20 DIAGNOSIS — M6281 Muscle weakness (generalized): Secondary | ICD-10-CM

## 2020-05-20 DIAGNOSIS — R600 Localized edema: Secondary | ICD-10-CM | POA: Diagnosis not present

## 2020-05-20 DIAGNOSIS — M25561 Pain in right knee: Secondary | ICD-10-CM | POA: Diagnosis not present

## 2020-05-20 NOTE — Therapy (Signed)
[facility] [place], [place], [address] Phone: [phone]   Fax:  [phone]  Physical Therapy Treatment  Patient Details  Name: [name] MRN: [id number] Date of Birth: [birth year] Referring Provider (PT): Paralee Cancel MD   Encounter Date: 05/20/2020   PT End of Session - 05/20/20 1313    Visit Number 8    Number of Visits 12    Date for PT Re-Evaluation 05/20/20    Authorization Type FOTO 6th 62 score  PROGRESS NOTE AT 10TH VISIT.  KX MODIFIER AFTER 15 VISITS.    PT Start Time 1303    PT Stop Time 1345    PT Time Calculation (min) 42 min    Activity Tolerance Patient tolerated treatment well    Behavior During Therapy [facility] for tasks assessed/performed           Past Medical History:  Diagnosis Date  . ANGIOMA 10/03/2008   REMOVED FROM RIGHT LOWER LEG-BENIGN  . Anxiety   . Aortic atherosclerosis ([place])   . Arthritis    RA  . Bronchitis    hx of   . Cancer (HCC)    BASAL CELL SKIN CANCER  . Complication of anesthesia   . Coronary artery disease   . Depression   . Fibromyalgia   . GERD 08/27/2008  . History of blood transfusion    1995  . History of hiatal hernia    hx of has had surgically repaired  . History of measles   . History of mumps   . History of nonmelanoma skin cancer   . HYPERLIPIDEMIA 08/27/2008  . HYPERTENSION 08/27/2008   pt is currently on no meds   . Pneumonia    hx of   . PONV (postoperative nausea and vomiting)   . Rheumatoid arthritis ([place])   . SEBORRHEIC KERATOSIS 08/27/2008  . Vocal cord paresis    hx of     Past Surgical History:  Procedure Laterality Date  . ABDOMINAL HYSTERECTOMY  1995  . APPENDECTOMY    . bilateral shoulder replacements     . BREAST BIOPSY Left   . BREAST DUCTAL SYSTEM EXCISION  08/16/2011   Procedure: EXCISION DUCTAL SYSTEM BREAST;  Surgeon: Joyice Faster. Cornett, MD;  Location: [place];  Service: General;  Laterality: Left;  left  breast duct excision  . [place]   times 2; 1980 and 1982  . [place]  . CHOLECYSTECTOMY  1997  . EXCISION/RELEASE BURSA HIP Right 10/21/2014   Procedure: OPEN RIGHT HIP BURSECTOMY, EXOSECTOMY;  Surgeon: Paralee Cancel, MD;  [facility];  Service: Orthopedics;  Laterality: Right;  . foot surgery  2004, 2005, 2008   bilat secondary to neuropathy   . JOINT REPLACEMENT  10,12   lt total knee X2  . KNEE ARTHROSCOPY  2007, 2008, 2009  . LAPAROSCOPIC GASTRIC SLEEVE RESECTION N/A 10/30/2013   Procedure: LAPAROSCOPIC GASTRIC SLEEVE RESECTION WITH HIATAL HERNIA REPAIR AND UPPER ENDOSCOPY;  Surgeon: Greer Pickerel, MD;  [facility];  Service: General;  Laterality: N/A;  . NASAL SINUS SURGERY  2013  . OPEN SURGICAL REPAIR OF GLUTEAL TENDON  01/17/2012   Procedure: OPEN SURGICAL REPAIR OF GLUTEAL TENDON;  Surgeon: Mauri Pole, MD;  [facility];  Service: Orthopedics;  Laterality: Right;  . TOTAL KNEE ARTHROPLASTY Right 04/15/2020   Procedure: TOTAL KNEE ARTHROPLASTY;  Surgeon: Paralee Cancel, MD;  [facility];  Service: Orthopedics;  Laterality: Right;  70 mins    There were no vitals filed for this visit.   Subjective Assessment - 05/20/20 1300    Subjective COVID-19 screen performed prior to patient entering clinic. Patient reports her shin area is so sore especially at night.    Pertinent History Left TKA, RA, Fibromyalgia, HTN.    How long can you sit comfortably? One hour+.    How long can you stand comfortably? Less than 5 minutes.    How long can you walk comfortably? Between room sin home with a FWW.    Patient Stated Goals Get out of pain.    Currently in Pain? Yes    Pain Location Leg    Pain Orientation Right;Anterior;Lower    Pain Descriptors / Indicators Sore    Pain Type Surgical pain    Pain Onset More than a month ago    Pain Frequency Constant              [facility] PT Assessment - 05/20/20 0001      Assessment   Medical  Diagnosis right total knee replacement.    Referring Provider (PT) Paralee Cancel MD    Onset Date/Surgical Date 04/15/20    Hand Dominance Right    Next MD Visit 05/27/2020      Precautions   Precaution Comments No ultrasound.      Restrictions   Weight Bearing Restrictions No                         [facility] Adult PT Treatment/Exercise - 05/20/20 0001      Knee/Hip Exercises: Aerobic   Nustep L4, seat 8 x15 min      Knee/Hip Exercises: Standing   Hip Flexion AROM;Right;2 sets;10 reps;Knee bent    Hip Abduction AROM;Right;2 sets;10 reps;Knee straight    Forward Step Up Right;15 reps;Hand Hold: 2;Step Height: 6"    Rocker Board 3 minutes      Knee/Hip Exercises: Seated   Long Arc Quad Strengthening;Right;2 sets;10 reps;Weights    Long Arc Quad Weight 4 lbs.      Knee/Hip Exercises: Supine   Straight Leg Raises AROM;Right;5 reps    Straight Leg Raises Limitations stopped due to R hip/groin      Modalities   Modalities Vasopneumatic      Vasopneumatic   Number Minutes Vasopneumatic  10 minutes    Vasopnuematic Location  Knee    Vasopneumatic Pressure Low    Vasopneumatic Temperature  34                       PT Long Term Goals - 05/12/20 1355      PT LONG TERM GOAL #1   Title Patient will be independent with HEP    Time 4    Period Weeks    Status On-going      PT LONG TERM GOAL #2   Title Active right knee flexion to 110-115 degrees+ so the patient can perform functional tasks and do so with pain not > 2-3/10.    Baseline AROM 98 degrees 05/12/20    Time 4    Period Weeks    Status On-going      PT LONG TERM GOAL #3   Title Full right active knee extension in order to normalize gait.    Baseline AROM -10 degrees 05/12/20    Time 4    Period Weeks    Status On-going      PT  LONG TERM GOAL #4   Title Increase right hip and knee strength to a solid 4+/5 to provide good stability for accomplishment of functional activities.    Time 4     Period Weeks    Status On-going      PT LONG TERM GOAL #5   Title Walk 200 feet with straight cane.    Time 4    Period Weeks    Status On-going                 Plan - 05/20/20 1338    Clinical Impression Statement Patient presented in clinic with greater R hip pain. Patient observed ambulating with less B hip/knee flexion without AD. Patient corrected for technique for step ups as she tends to circumduct the R hip. Patient reported R hip pain during standing abduction, forward step ups and SLR. Therex cut short secondary to R hip/groin pain. Normal vasopneumatic response noted following removal of the modality.    Personal Factors and Comorbidities Comorbidity 1;Comorbidity 2;Other    Comorbidities Left TKA, RA, Fibromyalgia, HTN.    Examination-Activity Limitations Other;Locomotion Level    Examination-Participation Restrictions Other    Stability/Clinical Decision Making Stable/Uncomplicated    Rehab Potential Good    PT Frequency 3x / week    PT Duration 4 weeks    PT Treatment/Interventions ADLs/Self Care Home Management;Cryotherapy;Electrical Stimulation;Moist Heat;Neuromuscular re-education;Therapeutic exercise;Therapeutic activities;Patient/family education;Manual techniques;Vasopneumatic Device    PT Next Visit Plan cont with POC for PROM and PRE's / Vasopneumatic    Consulted and Agree with Plan of Care Patient           Patient will benefit from skilled therapeutic intervention in order to improve the following deficits and impairments:  Pain,Decreased activity tolerance,Decreased strength,Difficulty walking  Visit Diagnosis: Chronic pain of right knee  Stiffness of right knee, not elsewhere classified  Localized edema  Muscle weakness (generalized)     Problem List Patient Active Problem List   Diagnosis Date Noted  . Obese 04/16/2020  . Osteoarthritis of right knee 04/16/2020  . S/P total knee arthroplasty, right 04/15/2020  . Pulmonary nodules  12/05/2019  . Bilateral occipital neuralgia 09/26/2017  . Status post left foot surgery 07/07/2015  . Neuroma 05/20/2015  . HAV (hallux abducto valgus) 05/20/2015  . Hammertoe 05/20/2015  . Prominent metatarsal head 05/20/2015  . Chronic foot pain 05/20/2015  . Anxiety state 03/03/2015  . Hip bursitis 10/22/2014  . Osteophyte of hip 10/21/2014  . Epigastric cramping 11/09/2013  . Nausea with vomiting 11/06/2013  . Rheumatoid arthritis ([place]) 11/01/2013  . S/P laparoscopic sleeve gastrectomy 10/30/2013  . Anemia 06/04/2013  . CAP (community acquired pneumonia) 02/17/2013  . Restless leg syndrome 09/18/2012  . Morbid obesity with BMI of 45.0-49.9, adult ([place]) 01/18/2012  . Right gluteus tear 01/17/2012  . Nipple discharge in female 07/08/2011  . Chronic insomnia 06/18/2011  . Discharge from nipple 01/06/2011  . TMJ PAIN 01/12/2010  . DEPRESSION 12/10/2009  . SINUSITIS, ACUTE 12/30/2008  . Lumbago 12/30/2008  . EDEMA 10/03/2008  . Hyperlipidemia 08/27/2008  . Essential hypertension 08/27/2008  . GERD 08/27/2008    Standley Brooking, PTA 05/20/2020, 1:57 PM  [facility] [address] [place], [place], [address] Phone: [phone]   Fax:  [phone]  Name: [name] MRN: [id number] Date of Birth: 07/23/1955

## 2020-05-22 ENCOUNTER — Other Ambulatory Visit: Payer: Self-pay | Admitting: Family Medicine

## 2020-05-22 ENCOUNTER — Ambulatory Visit: Payer: Medicare Other | Admitting: Physical Therapy

## 2020-05-22 ENCOUNTER — Other Ambulatory Visit: Payer: Self-pay

## 2020-05-22 ENCOUNTER — Encounter: Payer: Self-pay | Admitting: Physical Therapy

## 2020-05-22 DIAGNOSIS — R600 Localized edema: Secondary | ICD-10-CM

## 2020-05-22 DIAGNOSIS — G8929 Other chronic pain: Secondary | ICD-10-CM

## 2020-05-22 DIAGNOSIS — M25561 Pain in right knee: Secondary | ICD-10-CM | POA: Diagnosis not present

## 2020-05-22 DIAGNOSIS — M25661 Stiffness of right knee, not elsewhere classified: Secondary | ICD-10-CM

## 2020-05-22 DIAGNOSIS — M6281 Muscle weakness (generalized): Secondary | ICD-10-CM | POA: Diagnosis not present

## 2020-05-22 NOTE — Telephone Encounter (Signed)
Last refill-11/21/2019-90 tabs 1 refill Last office visit-12/24/2019  Next office visit- 06/02/2020

## 2020-05-22 NOTE — Therapy (Signed)
[facility] [place], [place], [address] Phone: [phone]   Fax:  [phone]  Physical Therapy Treatment  Patient Details  Name: [name] MRN: [id number] Date of Birth: [birth year] Referring Provider (PT): Paralee Cancel MD   Encounter Date: 05/22/2020   PT End of Session - 05/22/20 1412    Visit Number 9    Number of Visits 12    Date for PT Re-Evaluation 05/20/20    Authorization Type FOTO 6th 62 score  PROGRESS NOTE AT 10TH VISIT.  KX MODIFIER AFTER 15 VISITS.    PT Start Time 1303    PT Stop Time 1357    PT Time Calculation (min) 54 min    Activity Tolerance Patient tolerated treatment well    Behavior During Therapy WFL for tasks assessed/performed           Past Medical History:  Diagnosis Date  . ANGIOMA 10/03/2008   REMOVED FROM RIGHT LOWER LEG-BENIGN  . Anxiety   . Aortic atherosclerosis ([place])   . Arthritis    RA  . Bronchitis    hx of   . Cancer (HCC)    BASAL CELL SKIN CANCER  . Complication of anesthesia   . Coronary artery disease   . Depression   . Fibromyalgia   . GERD 08/27/2008  . History of blood transfusion    1995  . History of hiatal hernia    hx of has had surgically repaired  . History of measles   . History of mumps   . History of nonmelanoma skin cancer   . HYPERLIPIDEMIA 08/27/2008  . HYPERTENSION 08/27/2008   pt is currently on no meds   . Pneumonia    hx of   . PONV (postoperative nausea and vomiting)   . Rheumatoid arthritis ([place])   . SEBORRHEIC KERATOSIS 08/27/2008  . Vocal cord paresis    hx of     Past Surgical History:  Procedure Laterality Date  . ABDOMINAL HYSTERECTOMY  1995  . APPENDECTOMY    . bilateral shoulder replacements     . BREAST BIOPSY Left   . BREAST DUCTAL SYSTEM EXCISION  08/16/2011   Procedure: EXCISION DUCTAL SYSTEM BREAST;  Surgeon: Joyice Faster. Cornett, MD;  Location: [facility];  Service: General;  Laterality: Left;  left  breast duct excision  . [place]   times 2; 1980 and 1982  . [place]  . CHOLECYSTECTOMY  1997  . EXCISION/RELEASE BURSA HIP Right 10/21/2014   Procedure: OPEN RIGHT HIP BURSECTOMY, EXOSECTOMY;  Surgeon: Paralee Cancel, MD;  Location: [facility];  Service: Orthopedics;  Laterality: Right;  . foot surgery  2004, 2005, 2008   bilat secondary to neuropathy   . JOINT REPLACEMENT  10,12   lt total knee X2  . KNEE ARTHROSCOPY  2007, 2008, 2009  . LAPAROSCOPIC GASTRIC SLEEVE RESECTION N/A 10/30/2013   Procedure: LAPAROSCOPIC GASTRIC SLEEVE RESECTION WITH HIATAL HERNIA REPAIR AND UPPER ENDOSCOPY;  Surgeon: Greer Pickerel, MD;  Location: [facility];  Service: General;  Laterality: N/A;  . NASAL SINUS SURGERY  2013  . OPEN SURGICAL REPAIR OF GLUTEAL TENDON  01/17/2012   Procedure: OPEN SURGICAL REPAIR OF GLUTEAL TENDON;  Surgeon: Mauri Pole, MD;  Location: [facility];  Service: Orthopedics;  Laterality: Right;  . TOTAL KNEE ARTHROPLASTY Right 04/15/2020   Procedure: TOTAL KNEE ARTHROPLASTY;  Surgeon: Paralee Cancel, MD;  Location[facility];  Service: Orthopedics;  Laterality: Right;  70 mins    There were no vitals filed for this visit.   Subjective Assessment - 05/22/20 1308    Subjective COVID-19 screen performed prior to patient entering clinic. Patient reports medial knee pain again today. Reports B hip discomfort continued today.    Pertinent History Left TKA, RA, Fibromyalgia, HTN.    How long can you sit comfortably? One hour+.    How long can you stand comfortably? Less than 5 minutes.    How long can you walk comfortably? Between room sin home with a FWW.    Patient Stated Goals Get out of pain.    Currently in Pain? Yes    Pain Score 5     Pain Location Knee    Pain Orientation Right;Medial    Pain Descriptors / Indicators Sore    Pain Type Surgical pain    Pain Onset More than a month ago    Pain Frequency Constant    Multiple Pain Sites Yes    Pain Score 6    Pain  Location Hip    Pain Orientation Left;Right    Pain Descriptors / Indicators Sore    Pain Type Acute pain    Pain Onset In the past 7 days    Pain Frequency Constant              [facility] PT Assessment - 05/22/20 0001      Assessment   Medical Diagnosis right total knee replacement.    Referring Provider (PT) Paralee Cancel MD    Onset Date/Surgical Date 04/15/20    Hand Dominance Right    Next MD Visit 05/27/2020      Precautions   Precaution Comments No ultrasound.      Restrictions   Weight Bearing Restrictions No                         [facility] Adult PT Treatment/Exercise - 05/22/20 0001      Knee/Hip Exercises: Aerobic   Nustep L4, seat 8 x15 min      Knee/Hip Exercises: Standing   Heel Raises Both;20 reps    Heel Raises Limitations B toe raise x20 reps    Forward Lunges Right;15 reps;3 seconds    Hip Abduction AROM;Right;2 sets;10 reps;Knee straight      Knee/Hip Exercises: Seated   Long Arc Quad Strengthening;Right;2 sets;10 reps;Weights    Long Arc Quad Weight 4 lbs.    Clamshell with TheraBand Green   x20 reps   Hamstring Curl Strengthening;Right;3 sets;10 reps;Limitations    Hamstring Limitations green theraband      Knee/Hip Exercises: Supine   Straight Leg Raises AROM;Right;5 reps    Straight Leg Raises Limitations stopped due to R hip/groin      Modalities   Modalities Vasopneumatic      Vasopneumatic   Number Minutes Vasopneumatic  10 minutes    Vasopnuematic Location  Knee    Vasopneumatic Pressure Low    Vasopneumatic Temperature  34                       PT Long Term Goals - 05/12/20 1355      PT LONG TERM GOAL #1   Title Patient will be independent with HEP    Time 4    Period Weeks    Status On-going      PT LONG TERM GOAL #2   Title Active right knee flexion to 110-115 degrees+ so the  patient can perform functional tasks and do so with pain not > 2-3/10.    Baseline AROM 98 degrees 05/12/20    Time 4     Period Weeks    Status On-going      PT LONG TERM GOAL #3   Title Full right active knee extension in order to normalize gait.    Baseline AROM -10 degrees 05/12/20    Time 4    Period Weeks    Status On-going      PT LONG TERM GOAL #4   Title Increase right hip and knee strength to a solid 4+/5 to provide good stability for accomplishment of functional activities.    Time 4    Period Weeks    Status On-going      PT LONG TERM GOAL #5   Title Walk 200 feet with straight cane.    Time 4    Period Weeks    Status On-going                 Plan - 05/22/20 1420    Clinical Impression Statement Patient presented in clinic with R knee soreness and hip discomfort as well. Patient able to tolerate therex fairly well although limited by hip discomfort. Seated strengthening was tolerated well. SLR limited with reps due to hip discomfort. Decreased hip/knee flexion notable during gait at this time. Normal vasopneumatic response noted following removal of the modality.    Personal Factors and Comorbidities Comorbidity 1;Comorbidity 2;Other    Comorbidities Left TKA, RA, Fibromyalgia, HTN.    Examination-Activity Limitations Other;Locomotion Level    Examination-Participation Restrictions Other    Stability/Clinical Decision Making Stable/Uncomplicated    Rehab Potential Good    PT Frequency 3x / week    PT Duration 4 weeks    PT Treatment/Interventions ADLs/Self Care Home Management;Cryotherapy;Electrical Stimulation;Moist Heat;Neuromuscular re-education;Therapeutic exercise;Therapeutic activities;Patient/family education;Manual techniques;Vasopneumatic Device    PT Next Visit Plan cont with POC for PROM and PRE's / Vasopneumatic    Consulted and Agree with Plan of Care Patient           Patient will benefit from skilled therapeutic intervention in order to improve the following deficits and impairments:  Pain,Decreased activity tolerance,Decreased strength,Difficulty  walking  Visit Diagnosis: Chronic pain of right knee  Stiffness of right knee, not elsewhere classified  Localized edema  Muscle weakness (generalized)     Problem List Patient Active Problem List   Diagnosis Date Noted  . Obese 04/16/2020  . Osteoarthritis of right knee 04/16/2020  . S/P total knee arthroplasty, right 04/15/2020  . Pulmonary nodules 12/05/2019  . Bilateral occipital neuralgia 09/26/2017  . Status post left foot surgery 07/07/2015  . Neuroma 05/20/2015  . HAV (hallux abducto valgus) 05/20/2015  . Hammertoe 05/20/2015  . Prominent metatarsal head 05/20/2015  . Chronic foot pain 05/20/2015  . Anxiety state 03/03/2015  . Hip bursitis 10/22/2014  . Osteophyte of hip 10/21/2014  . Epigastric cramping 11/09/2013  . Nausea with vomiting 11/06/2013  . Rheumatoid arthritis ([place]) 11/01/2013  . S/P laparoscopic sleeve gastrectomy 10/30/2013  . Anemia 06/04/2013  . CAP (community acquired pneumonia) 02/17/2013  . Restless leg syndrome 09/18/2012  . Morbid obesity with BMI of 45.0-49.9, adult ([place]) 01/18/2012  . Right gluteus tear 01/17/2012  . Nipple discharge in female 07/08/2011  . Chronic insomnia 06/18/2011  . Discharge from nipple 01/06/2011  . TMJ PAIN 01/12/2010  . DEPRESSION 12/10/2009  . SINUSITIS, ACUTE 12/30/2008  . Lumbago 12/30/2008  . EDEMA 10/03/2008  .  Hyperlipidemia 08/27/2008  . Essential hypertension 08/27/2008  . GERD 08/27/2008    Standley Brooking, PTA 05/22/2020, 2:53 PM  [facility] [address] [place], [place], [address] Phone: [phone]   Fax:  [phone]  Name: [name] MRN: [id number] Date of Birth: 11/03/1955

## 2020-05-27 ENCOUNTER — Ambulatory Visit: Payer: Medicare Other | Attending: Orthopedic Surgery | Admitting: *Deleted

## 2020-05-27 ENCOUNTER — Other Ambulatory Visit: Payer: Self-pay

## 2020-05-27 DIAGNOSIS — M25661 Stiffness of right knee, not elsewhere classified: Secondary | ICD-10-CM | POA: Insufficient documentation

## 2020-05-27 DIAGNOSIS — M6281 Muscle weakness (generalized): Secondary | ICD-10-CM | POA: Diagnosis not present

## 2020-05-27 DIAGNOSIS — M25561 Pain in right knee: Secondary | ICD-10-CM | POA: Insufficient documentation

## 2020-05-27 DIAGNOSIS — R600 Localized edema: Secondary | ICD-10-CM | POA: Diagnosis not present

## 2020-05-27 DIAGNOSIS — G8929 Other chronic pain: Secondary | ICD-10-CM | POA: Insufficient documentation

## 2020-05-27 NOTE — Therapy (Signed)
[facility] [place], [place], [address] Phone: [phone]   Fax:  [phone]  Physical Therapy Treatment  Patient Details  Name: [name] MRN: [id number] Date of Birth: September 17, 1955 Referring Provider (PT): Paralee Cancel MD   Encounter Date: 05/27/2020   PT End of Session - 05/27/20 1358    Visit Number 10    Number of Visits 12    Date for PT Re-Evaluation 05/20/20    Authorization Type FOTO 6th 62 score  PROGRESS NOTE AT 10TH VISIT.  KX MODIFIER AFTER 15 VISITS., 10th visit 58%    PT Start Time 1300    PT Stop Time 1350    PT Time Calculation (min) 50 min           Past Medical History:  Diagnosis Date  . ANGIOMA 10/03/2008   REMOVED FROM RIGHT LOWER LEG-BENIGN  . Anxiety   . Aortic atherosclerosis ([place])   . Arthritis    RA  . Bronchitis    hx of   . Cancer (HCC)    BASAL CELL SKIN CANCER  . Complication of anesthesia   . Coronary artery disease   . Depression   . Fibromyalgia   . GERD 08/27/2008  . History of blood transfusion    1995  . History of hiatal hernia    hx of has had surgically repaired  . History of measles   . History of mumps   . History of nonmelanoma skin cancer   . HYPERLIPIDEMIA 08/27/2008  . HYPERTENSION 08/27/2008   pt is currently on no meds   . Pneumonia    hx of   . PONV (postoperative nausea and vomiting)   . Rheumatoid arthritis ([place])   . SEBORRHEIC KERATOSIS 08/27/2008  . Vocal cord paresis    hx of     Past Surgical History:  Procedure Laterality Date  . ABDOMINAL HYSTERECTOMY  1995  . APPENDECTOMY    . bilateral shoulder replacements     . BREAST BIOPSY Left   . BREAST DUCTAL SYSTEM EXCISION  08/16/2011   Procedure: EXCISION DUCTAL SYSTEM BREAST;  Surgeon: Joyice Faster. Cornett, MD;  Location: [facility];  Service: General;  Laterality: Left;  left breast duct excision  . [place]   times 2; 1980 and 1982  . [place]   . CHOLECYSTECTOMY  1997  . EXCISION/RELEASE BURSA HIP Right 10/21/2014   Procedure: OPEN RIGHT HIP BURSECTOMY, EXOSECTOMY;  Surgeon: Paralee Cancel, MD;  [facility];  Service: Orthopedics;  Laterality: Right;  . foot surgery  2004, 2005, 2008   bilat secondary to neuropathy   . JOINT REPLACEMENT  10,12   lt total knee X2  . KNEE ARTHROSCOPY  2007, 2008, 2009  . LAPAROSCOPIC GASTRIC SLEEVE RESECTION N/A 10/30/2013   Procedure: LAPAROSCOPIC GASTRIC SLEEVE RESECTION WITH HIATAL HERNIA REPAIR AND UPPER ENDOSCOPY;  Surgeon: Greer Pickerel, MD;  Location[facility];  Service: General;  Laterality: N/A;  . NASAL SINUS SURGERY  2013  . OPEN SURGICAL REPAIR OF GLUTEAL TENDON  01/17/2012   Procedure: OPEN SURGICAL REPAIR OF GLUTEAL TENDON;  Surgeon: Mauri Pole, MD;  [facility];  Service: Orthopedics;  Laterality: Right;  . TOTAL KNEE ARTHROPLASTY Right 04/15/2020   Procedure: TOTAL KNEE ARTHROPLASTY;  Surgeon: Paralee Cancel, MD;  [facility];  Service: Orthopedics;  Laterality: Right;  70 mins    There were no vitals filed for this visit.  Subjective Assessment - 05/27/20 1354    Subjective COVID-19 screen performed prior to patient entering clinic. RT knee 4/10 and RT hip 5-6/10    Pertinent History Left TKA, RA, Fibromyalgia, HTN.    How long can you sit comfortably? One hour+.    How long can you stand comfortably? Less than 5 minutes.    How long can you walk comfortably? Between room sin home with a FWW.    Patient Stated Goals Get out of pain.    Currently in Pain? Yes    Pain Score 4     Pain Location Knee    Pain Orientation Right;Medial    Pain Descriptors / Indicators Sore    Pain Type Surgical pain    Pain Onset More than a month ago    Pain Frequency Constant                             [facility] Adult PT Treatment/Exercise - 05/27/20 0001      Knee/Hip Exercises: Aerobic   Nustep L4, seat 8 x15 min      Knee/Hip Exercises: Standing   Heel Raises  Both;20 reps    Heel Raises Limitations B toe raise x20 reps    Forward Step Up Right;15 reps;Hand Hold: 2;Step Height: 6"    Rocker Board 3 minutes      Knee/Hip Exercises: Seated   Long Arc Quad Strengthening;Right;10 reps;Weights;3 sets    [employer] Weight 4 lbs.      Modalities   Modalities Vasopneumatic      Vasopneumatic   Number Minutes Vasopneumatic  10 minutes    Vasopnuematic Location  Knee    Vasopneumatic Pressure Low    Vasopneumatic Temperature  34      Manual Therapy   Manual Therapy Passive ROM    Passive ROM manual PROM for right knee flexion and ext to improve mobility                       PT Long Term Goals - 05/12/20 1355      PT LONG TERM GOAL #1   Title Patient will be independent with HEP    Time 4    Period Weeks    Status On-going      PT LONG TERM GOAL #2   Title Active right knee flexion to 110-115 degrees+ so the patient can perform functional tasks and do so with pain not > 2-3/10.    Baseline AROM 98 degrees 05/12/20    Time 4    Period Weeks    Status On-going      PT LONG TERM GOAL #3   Title Full right active knee extension in order to normalize gait.    Baseline AROM -10 degrees 05/12/20    Time 4    Period Weeks    Status On-going      PT LONG TERM GOAL #4   Title Increase right hip and knee strength to a solid 4+/5 to provide good stability for accomplishment of functional activities.    Time 4    Period Weeks    Status On-going      PT LONG TERM GOAL #5   Title Walk 200 feet with straight cane.    Time 4    Period Weeks    Status On-going                 Plan -  05/27/20 1428    Clinical Impression Statement Pt arrived today doing fairly well with RT knee, But continues to have increased RT hip pain. She hopes to get an injection when she has a f/u with MD. Rx focused on ROM as well as strength. Vaso as tolerated.    Personal Factors and Comorbidities Comorbidity 1;Comorbidity 2;Other     Comorbidities Left TKA, RA, Fibromyalgia, HTN.    Examination-Activity Limitations Other;Locomotion Level    Examination-Participation Restrictions Other    Stability/Clinical Decision Making Stable/Uncomplicated    Rehab Potential Good    PT Frequency 3x / week    PT Duration 4 weeks    PT Treatment/Interventions ADLs/Self Care Home Management;Cryotherapy;Electrical Stimulation;Moist Heat;Neuromuscular re-education;Therapeutic exercise;Therapeutic activities;Patient/family education;Manual techniques;Vasopneumatic Device    PT Next Visit Plan cont with POC for PROM and PRE's / Vasopneumatic           Patient will benefit from skilled therapeutic intervention in order to improve the following deficits and impairments:  Pain,Decreased activity tolerance,Decreased strength,Difficulty walking  Visit Diagnosis: Chronic pain of right knee  Stiffness of right knee, not elsewhere classified  Localized edema  Muscle weakness (generalized)     Problem List Patient Active Problem List   Diagnosis Date Noted  . Obese 04/16/2020  . Osteoarthritis of right knee 04/16/2020  . S/P total knee arthroplasty, right 04/15/2020  . Pulmonary nodules 12/05/2019  . Bilateral occipital neuralgia 09/26/2017  . Status post left foot surgery 07/07/2015  . Neuroma 05/20/2015  . HAV (hallux abducto valgus) 05/20/2015  . Hammertoe 05/20/2015  . Prominent metatarsal head 05/20/2015  . Chronic foot pain 05/20/2015  . Anxiety state 03/03/2015  . Hip bursitis 10/22/2014  . Osteophyte of hip 10/21/2014  . Epigastric cramping 11/09/2013  . Nausea with vomiting 11/06/2013  . Rheumatoid arthritis ([place]) 11/01/2013  . S/P laparoscopic sleeve gastrectomy 10/30/2013  . Anemia 06/04/2013  . CAP (community acquired pneumonia) 02/17/2013  . Restless leg syndrome 09/18/2012  . Morbid obesity with BMI of 45.0-49.9, adult ([place]) 01/18/2012  . Right gluteus tear 01/17/2012  . Nipple discharge in female 07/08/2011   . Chronic insomnia 06/18/2011  . Discharge from nipple 01/06/2011  . TMJ PAIN 01/12/2010  . DEPRESSION 12/10/2009  . SINUSITIS, ACUTE 12/30/2008  . Lumbago 12/30/2008  . EDEMA 10/03/2008  . Hyperlipidemia 08/27/2008  . Essential hypertension 08/27/2008  . GERD 08/27/2008    Kaidyn Hernandes,CHRIS, PTA 05/27/2020, 2:50 PM  [facility] [address] [place], [place], [address] Phone: [phone]   Fax:  [phone]  Name: [name] MRN: [id number] Date of Birth: 1955-06-05

## 2020-05-29 ENCOUNTER — Encounter: Payer: Medicare Other | Admitting: Physical Therapy

## 2020-05-30 ENCOUNTER — Encounter (HOSPITAL_COMMUNITY): Payer: Self-pay | Admitting: Emergency Medicine

## 2020-05-30 ENCOUNTER — Emergency Department (HOSPITAL_COMMUNITY)
Admission: EM | Admit: 2020-05-30 | Discharge: 2020-05-31 | Disposition: A | Discharge status: Home / Self Care | Payer: Medicare Other | Attending: Emergency Medicine | Admitting: Emergency Medicine

## 2020-05-30 ENCOUNTER — Other Ambulatory Visit: Payer: Self-pay

## 2020-05-30 DIAGNOSIS — Z96651 Presence of right artificial knee joint: Secondary | ICD-10-CM | POA: Insufficient documentation

## 2020-05-30 DIAGNOSIS — F419 Anxiety disorder, unspecified: Secondary | ICD-10-CM | POA: Diagnosis present

## 2020-05-30 DIAGNOSIS — Z743 Need for continuous supervision: Secondary | ICD-10-CM | POA: Diagnosis not present

## 2020-05-30 DIAGNOSIS — Z5321 Procedure and treatment not carried out due to patient leaving prior to being seen by health care provider: Secondary | ICD-10-CM | POA: Insufficient documentation

## 2020-05-30 NOTE — ED Triage Notes (Signed)
[facility] - pt states that she took a cbd gummy because "she heard it was good for pain." pt states that she had a right total knee replacement x 5 weeks ago

## 2020-06-02 ENCOUNTER — Other Ambulatory Visit: Payer: Self-pay

## 2020-06-02 ENCOUNTER — Ambulatory Visit (INDEPENDENT_AMBULATORY_CARE_PROVIDER_SITE_OTHER): Payer: Medicare Other | Admitting: Family Medicine

## 2020-06-02 ENCOUNTER — Encounter: Payer: Self-pay | Admitting: Family Medicine

## 2020-06-02 VITALS — BP 132/64 | HR 85 | Temp 98.1°F | Wt 213.9 lb

## 2020-06-02 DIAGNOSIS — E7800 Pure hypercholesterolemia, unspecified: Secondary | ICD-10-CM

## 2020-06-02 DIAGNOSIS — M7061 Trochanteric bursitis, right hip: Secondary | ICD-10-CM | POA: Diagnosis not present

## 2020-06-02 DIAGNOSIS — I10 Essential (primary) hypertension: Secondary | ICD-10-CM | POA: Diagnosis not present

## 2020-06-02 DIAGNOSIS — R5383 Other fatigue: Secondary | ICD-10-CM | POA: Diagnosis not present

## 2020-06-02 DIAGNOSIS — Z471 Aftercare following joint replacement surgery: Secondary | ICD-10-CM | POA: Diagnosis not present

## 2020-06-02 DIAGNOSIS — M7062 Trochanteric bursitis, left hip: Secondary | ICD-10-CM | POA: Diagnosis not present

## 2020-06-02 DIAGNOSIS — Z96651 Presence of right artificial knee joint: Secondary | ICD-10-CM | POA: Diagnosis not present

## 2020-06-02 DIAGNOSIS — D649 Anemia, unspecified: Secondary | ICD-10-CM | POA: Diagnosis not present

## 2020-06-02 LAB — CBC WITH DIFFERENTIAL/PLATELET
Basophils Absolute: 0 10*3/uL (ref 0.0–0.1)
Basophils Relative: 0.6 % (ref 0.0–3.0)
Eosinophils Absolute: 0.1 10*3/uL (ref 0.0–0.7)
Eosinophils Relative: 2 % (ref 0.0–5.0)
HCT: 41.3 % (ref 36.0–46.0)
Hemoglobin: 13.4 g/dL (ref 12.0–15.0)
Lymphocytes Relative: 7.3 % — ABNORMAL LOW (ref 12.0–46.0)
Lymphs Abs: 0.5 10*3/uL — ABNORMAL LOW (ref 0.7–4.0)
MCHC: 32.4 g/dL (ref 30.0–36.0)
MCV: 92 fl (ref 78.0–100.0)
Monocytes Absolute: 0.4 10*3/uL (ref 0.1–1.0)
Monocytes Relative: 5.2 % (ref 3.0–12.0)
Neutro Abs: 6.2 10*3/uL (ref 1.4–7.7)
Neutrophils Relative %: 84.9 % — ABNORMAL HIGH (ref 43.0–77.0)
Platelets: 239 10*3/uL (ref 150.0–400.0)
RBC: 4.49 Mil/uL (ref 3.87–5.11)
RDW: 15.8 % — ABNORMAL HIGH (ref 11.5–15.5)
WBC: 7.3 10*3/uL (ref 4.0–10.5)

## 2020-06-02 LAB — LIPID PANEL
Cholesterol: 169 mg/dL (ref 0–200)
HDL: 105.1 mg/dL (ref >39.00)
LDL Cholesterol: 42 mg/dL (ref 0–99)
NonHDL: 64.21
Total CHOL/HDL Ratio: 2
Triglycerides: 112 mg/dL (ref 0.0–149.0)
VLDL: 22.4 mg/dL (ref 0.0–40.0)

## 2020-06-02 LAB — HEPATIC FUNCTION PANEL
ALT: 21 U/L (ref 0–35)
AST: 26 U/L (ref 0–37)
Albumin: 4.2 g/dL (ref 3.5–5.2)
Alkaline Phosphatase: 77 U/L (ref 39–117)
Bilirubin, Direct: 0.1 mg/dL (ref 0.0–0.3)
Total Bilirubin: 0.6 mg/dL (ref 0.2–1.2)
Total Protein: 6.5 g/dL (ref 6.0–8.3)

## 2020-06-02 NOTE — Progress Notes (Signed)
Established Patient Office Visit  Subjective:  Patient ID: [name], female    DOB: Aug 12, 1955  Age: 65 y.o. MRN: [id number]  CC:  Chief Complaint  Patient presents with  . Follow-up    Nodules on lungs    HPI [name] presents for the following issues  She states she is here for follow-up on "nodules on lungs ".  This apparently goes back to CT renal stone study 01/09/2018.  There was comment of "tiny nodule right middle lobe and right lung base stable ".  This was also compared to prior CT 10/04/2014.  Patient smoked only briefly for about 4 years.  No recent hemoptysis or dyspnea.  No cough.  She has had 4 different surgical procedures past 6 months.  She had knee replacement surgery 5 weeks ago.  Hemoglobin postop down was 9.9.  She has some general fatigue and would like to get her hemoglobin rechecked.  No significant dizziness.  Eating fairly well.  She is followed by cardiology and has hyperlipidemia.  Recent coronary calcium score of 71.  This was 81st percentile for age and gender.  She had calcified proximal LAD plaque 25 to 49% stenosis.  No recent chest pains.  Takes Crestor 20 mg daily and started on this in December and needs follow-up labs.  No significant myalgias.  She took a couple of CBD Gummies because she had heard they were helpful for pain and after about 2 hours started having some dizziness and that prompted ER visit.  She left after initial evaluation there.  Has had no prolonged symptoms since then.  Past Medical History:  Diagnosis Date  . ANGIOMA 10/03/2008   REMOVED FROM RIGHT LOWER LEG-BENIGN  . Anxiety   . Aortic atherosclerosis ([place])   . Arthritis    RA  . Bronchitis    hx of   . Cancer (HCC)    BASAL CELL SKIN CANCER  . Complication of anesthesia   . Coronary artery disease   . Depression   . Fibromyalgia   . GERD 08/27/2008  . History of blood transfusion    1995  . History of hiatal hernia    hx of has had surgically  repaired  . History of measles   . History of mumps   . History of nonmelanoma skin cancer   . HYPERLIPIDEMIA 08/27/2008  . HYPERTENSION 08/27/2008   pt is currently on no meds   . Pneumonia    hx of   . PONV (postoperative nausea and vomiting)   . Rheumatoid arthritis ([place])   . SEBORRHEIC KERATOSIS 08/27/2008  . Vocal cord paresis    hx of     Past Surgical History:  Procedure Laterality Date  . ABDOMINAL HYSTERECTOMY  1995  . APPENDECTOMY    . bilateral shoulder replacements     . BREAST BIOPSY Left   . BREAST DUCTAL SYSTEM EXCISION  08/16/2011   Procedure: EXCISION DUCTAL SYSTEM BREAST;  Surgeon: Joyice Faster. Cornett, MD;  Location: [facility];  Service: General;  Laterality: Left;  left breast duct excision  . [place]   times 2; 1980 and 1982  . [place]  . CHOLECYSTECTOMY  1997  . EXCISION/RELEASE BURSA HIP Right 10/21/2014   Procedure: OPEN RIGHT HIP BURSECTOMY, EXOSECTOMY;  Surgeon: Paralee Cancel, MD;  Location: [facility];  Service: Orthopedics;  Laterality: Right;  . foot surgery  2004, 2005, 2008   bilat secondary to neuropathy   .  JOINT REPLACEMENT  10,12   lt total knee X2  . KNEE ARTHROSCOPY  2007, 2008, 2009  . LAPAROSCOPIC GASTRIC SLEEVE RESECTION N/A 10/30/2013   Procedure: LAPAROSCOPIC GASTRIC SLEEVE RESECTION WITH HIATAL HERNIA REPAIR AND UPPER ENDOSCOPY;  Surgeon: Greer Pickerel, MD;  Location: [facility];  Service: General;  Laterality: N/A;  . NASAL SINUS SURGERY  2013  . OPEN SURGICAL REPAIR OF GLUTEAL TENDON  01/17/2012   Procedure: OPEN SURGICAL REPAIR OF GLUTEAL TENDON;  Surgeon: Mauri Pole, MD;  Location: [facility];  Service: Orthopedics;  Laterality: Right;  . TOTAL KNEE ARTHROPLASTY Right 04/15/2020   Procedure: TOTAL KNEE ARTHROPLASTY;  Surgeon: Paralee Cancel, MD;  [facility];  Service: Orthopedics;  Laterality: Right;  70 mins    Family History  Problem Relation Age of Onset  . Cancer Mother        breast  . Breast  cancer Mother 29  . Kidney disease Father   . Epilepsy Father   . Heart attack Sister   . Heart attack Sister   . Diabetes Son     Social History   Socioeconomic History  . Marital status: Married    Spouse name: Not on file  . Number of children: 2  . Years of education: Not on file  . Highest education level: High school graduate  Occupational History  . Occupation: retired    Comment: Building services engineer  Tobacco Use  . Smoking status: Former Smoker    Packs/day: 0.25    Years: 4.00    Pack years: 1.00    Types: Cigarettes    Quit date: 02/23/1984    Years since quitting: 36.2  . Smokeless tobacco: Never Used  Vaping Use  . Vaping Use: Never used  Substance and Sexual Activity  . Alcohol use: No  . Drug use: No  . Sexual activity: Not on file  Other Topics Concern  . Not on file  Social History Narrative   Lives at home with husband   Disabled   Right handed   Caffeine: 1 cup of coffee daily   Social Determinants of Health   Financial Resource Strain: Not on file  Food Insecurity: Not on file  Transportation Needs: Not on file  Physical Activity: Not on file  Stress: Not on file  Social Connections: Not on file  Intimate Partner Violence: Not on file    Outpatient Medications Prior to Visit  Medication Sig Dispense Refill  . B-D TB SYRINGE 1CC/27GX1/2" 27G X 1/2" 1 ML MISC     . Biotin 10 MG CAPS Take 10 mg by mouth daily.    Marland Kitchen EPINEPHrine (EPIPEN 2-PAK) 0.3 mg/0.3 mL IJ SOAJ injection Inject 0.3 mLs (0.3 mg total) into the muscle once. (Patient taking differently: Inject 0.3 mg into the muscle as needed for anaphylaxis.) 2 each 1  . famotidine (PEPCID) 40 MG tablet Take 40 mg by mouth at bedtime.    . folic acid (FOLVITE) 884 MCG tablet Take 1,600 mcg by mouth daily.    . methocarbamol (ROBAXIN) 500 MG tablet Take 1 tablet (500 mg total) by mouth every 6 (six) hours as needed for muscle spasms. 40 tablet 0  . ondansetron (ZOFRAN) 4 MG tablet Take 1  tablet (4 mg total) by mouth every 8 (eight) hours as needed for nausea or vomiting. 20 tablet 1  . pantoprazole (PROTONIX) 40 MG tablet Take 40 mg by mouth daily before breakfast.    . predniSONE (DELTASONE) 5 MG tablet Take  5 mg by mouth daily with breakfast.    . rosuvastatin (CRESTOR) 20 MG tablet Take 1 tablet (20 mg total) by mouth daily. 90 tablet 3  . scopolamine (TRANSDERM-SCOP) 1 MG/3DAYS Place 1 patch (1.5 mg total) onto the skin every 3 (three) days. Behind ear (Patient taking differently: Place 1 patch onto the skin See admin instructions. Apply 1 patch behind ear every 72 hours as needed when traveling) 10 patch 0  . sertraline (ZOLOFT) 50 MG tablet TAKE 1 & 1/2 (ONE & ONE-HALF) TABLETS BY MOUTH ONCE DAILY 135 tablet 0  . zolpidem (AMBIEN) 10 MG tablet TAKE 1 TABLET BY MOUTH AT BEDTIME AS NEEDED FOR SLEEP 90 tablet 0  . celecoxib (CELEBREX) 200 MG capsule Take 1 capsule (200 mg total) by mouth 2 (two) times daily. 60 capsule 0  . docusate sodium (COLACE) 100 MG capsule Take 1 capsule (100 mg total) by mouth 2 (two) times daily. 28 capsule 0  . ferrous sulfate (FERROUSUL) 325 (65 FE) MG tablet Take 1 tablet (325 mg total) by mouth 3 (three) times daily with meals for 14 days. 42 tablet 0  . HYDROcodone-acetaminophen (NORCO) 7.5-325 MG tablet Take 1-2 tablets by mouth every 4 (four) hours as needed for moderate pain. 60 tablet 0  . polyethylene glycol (MIRALAX / GLYCOLAX) 17 g packet Take 17 g by mouth 2 (two) times daily. 28 packet 0   No facility-administered medications prior to visit.    Allergies  Allergen Reactions  . Bee Venom Anaphylaxis  . Keflex [Cephalexin] Hives    Pt took po kelfex for sinus infection, developed blisters over entire body  . Codeine Sulfate Other (See Comments)    GI upset  . Penicillins Rash    ROS Review of Systems  Constitutional: Positive for fatigue. Negative for fever.  Eyes: Negative for visual disturbance.  Respiratory: Negative for  cough, chest tightness, shortness of breath and wheezing.   Cardiovascular: Negative for chest pain, palpitations and leg swelling.  Gastrointestinal: Negative for abdominal pain.  Genitourinary: Negative for dysuria.  Neurological: Negative for dizziness, seizures, syncope, weakness, light-headedness and headaches.      Objective:    Physical Exam Vitals reviewed.  Cardiovascular:     Rate and Rhythm: Normal rate and regular rhythm.  Pulmonary:     Effort: Pulmonary effort is normal.     Breath sounds: Normal breath sounds.  Musculoskeletal:     Right lower leg: No edema.     Left lower leg: No edema.  Neurological:     Mental Status: She is alert.     BP 132/64 (BP Location: Left Arm, Patient Position: Sitting, Cuff Size: Normal)   Pulse 85   Temp 98.1 F (36.7 C) (Oral)   Wt 213 lb 14.4 oz (97 kg)   SpO2 99%   BMI 36.72 kg/m  Wt Readings from Last 3 Encounters:  06/02/20 213 lb 14.4 oz (97 kg)  04/15/20 213 lb 6.4 oz (96.8 kg)  02/26/20 214 lb 3.2 oz (97.2 kg)     There are no preventive care reminders to display for this patient.  There are no preventive care reminders to display for this patient.  Lab Results  Component Value Date   TSH 2.30 12/05/2019   Lab Results  Component Value Date   WBC 10.3 04/17/2020   HGB 9.9 (L) 04/17/2020   HCT 31.9 (L) 04/17/2020   MCV 96.7 04/17/2020   PLT 261 04/17/2020   Lab Results  Component Value Date  NA 141 04/17/2020   K 4.3 04/17/2020   CO2 22 04/17/2020   GLUCOSE 123 (H) 04/17/2020   BUN 15 04/17/2020   CREATININE 0.55 04/17/2020   BILITOT 0.5 12/05/2019   ALKPHOS 73 01/30/2018   AST 16 12/05/2019   ALT 11 12/05/2019   PROT 6.1 12/05/2019   ALBUMIN 4.0 09/26/2017   CALCIUM 8.8 (L) 04/17/2020   ANIONGAP 11 04/17/2020   GFR 122.98 09/23/2014   Lab Results  Component Value Date   CHOL 220 (H) 12/05/2019   Lab Results  Component Value Date   HDL 84 12/05/2019   Lab Results  Component Value  Date   LDLCALC 113 (H) 12/05/2019   Lab Results  Component Value Date   TRIG 124 12/05/2019   Lab Results  Component Value Date   CHOLHDL 2.6 12/05/2019   No results found for: HGBA1C    Assessment & Plan:   Problem List Items Addressed This Visit      Unprioritized   Anemia   Relevant Orders   CBC with Differential/Platelet   Essential hypertension - Primary   Hyperlipidemia   Relevant Orders   Lipid panel   Hepatic function panel    Other Visit Diagnoses    Fatigue, unspecified type       Relevant Orders   CBC with Differential/Platelet    Patient has hyperlipidemia treated with Crestor 20 mg daily and needs follow-up labs.  Will check lipid panel and hepatic panel and forward these to her cardiologist  She has history of anemia and suspect largely related to postsurgical anemia.  Patient requesting follow-up hemoglobin today.  Check CBC  Remote history of nodules right middle lobe and right lung base which were classified as "tiny ".  Given her non-smoking status and size of these these were low risk.  She has no concerning symptoms at this time.  No further evaluation indicated at this time.  No orders of the defined types were placed in this encounter.   Follow-up: No follow-ups on file.    Carolann Littler, MD

## 2020-06-17 ENCOUNTER — Encounter: Payer: Self-pay | Admitting: Family Medicine

## 2020-06-18 ENCOUNTER — Telehealth (INDEPENDENT_AMBULATORY_CARE_PROVIDER_SITE_OTHER): Payer: Medicare Other | Admitting: Family Medicine

## 2020-06-18 ENCOUNTER — Other Ambulatory Visit: Payer: Self-pay

## 2020-06-18 ENCOUNTER — Encounter: Payer: Self-pay | Admitting: Family Medicine

## 2020-06-18 DIAGNOSIS — U071 COVID-19: Secondary | ICD-10-CM | POA: Diagnosis not present

## 2020-06-18 HISTORY — DX: COVID-19: U07.1

## 2020-06-18 MED ORDER — NIRMATRELVIR/RITONAVIR (PAXLOVID)TABLET: ORAL_TABLET | ORAL | 0 refills | Status: DC

## 2020-06-18 NOTE — Progress Notes (Signed)
This visit type was conducted due to national recommendations for restrictions regarding the COVID-19 pandemic in an effort to limit this patient's exposure and mitigate transmission in our community.   Virtual Visit via Video Note  I connected with [name] on 06/18/20 at  8:45 AM EDT by a video enabled telemedicine application and verified that I am speaking with the correct person using two identifiers.  Location patient: home Location provider:work or home office Persons participating in the virtual visit: patient, provider  I discussed the limitations of evaluation and management by telemedicine and the availability of in person appointments. The patient expressed understanding and agreed to proceed.   HPI: [name] called with onset yesterday of headache, chills, myalgias, congestion, and fever.  She had some mild diarrhea.  No vomiting.  Keeping down fluids well.  She did COVID test which came back positive.  She states her son and his family just got back from [place] and apparently they contacted COVID there.  [name] has been fully vaccinated including 2 boosters with Moderna with most recent last week.  Denies any dyspnea. She has pulse oximeter at home but not monitoring currently.  Fever was 102 this morning.   ROS: See pertinent positives and negatives per HPI.  Past Medical History:  Diagnosis Date  . ANGIOMA 10/03/2008   REMOVED FROM RIGHT LOWER LEG-BENIGN  . Anxiety   . Aortic atherosclerosis ([place])   . Arthritis    RA  . Bronchitis    hx of   . Cancer (HCC)    BASAL CELL SKIN CANCER  . Complication of anesthesia   . Coronary artery disease   . Depression   . Fibromyalgia   . GERD 08/27/2008  . History of blood transfusion    1995  . History of hiatal hernia    hx of has had surgically repaired  . History of measles   . History of mumps   . History of nonmelanoma skin cancer   . HYPERLIPIDEMIA 08/27/2008  . HYPERTENSION 08/27/2008   pt is currently on no  meds   . Pneumonia    hx of   . PONV (postoperative nausea and vomiting)   . Rheumatoid arthritis ([place])   . SEBORRHEIC KERATOSIS 08/27/2008  . Vocal cord paresis    hx of     Past Surgical History:  Procedure Laterality Date  . ABDOMINAL HYSTERECTOMY  1995  . APPENDECTOMY    . bilateral shoulder replacements     . BREAST BIOPSY Left   . BREAST DUCTAL SYSTEM EXCISION  08/16/2011   Procedure: EXCISION DUCTAL SYSTEM BREAST;  Surgeon: Joyice Faster. Cornett, MD;  Location: [facility];  Service: General;  Laterality: Left;  left breast duct excision  . [place]   times 2; 1980 and 1982  . [place]  . CHOLECYSTECTOMY  1997  . EXCISION/RELEASE BURSA HIP Right 10/21/2014   Procedure: OPEN RIGHT HIP BURSECTOMY, EXOSECTOMY;  Surgeon: Paralee Cancel, MD;  [facility];  Service: Orthopedics;  Laterality: Right;  . foot surgery  2004, 2005, 2008   bilat secondary to neuropathy   . JOINT REPLACEMENT  10,12   lt total knee X2  . KNEE ARTHROSCOPY  2007, 2008, 2009  . LAPAROSCOPIC GASTRIC SLEEVE RESECTION N/A 10/30/2013   Procedure: LAPAROSCOPIC GASTRIC SLEEVE RESECTION WITH HIATAL HERNIA REPAIR AND UPPER ENDOSCOPY;  Surgeon: Greer Pickerel, MD;  [facility];  Service: General;  Laterality: N/A;  . NASAL SINUS SURGERY  2013  . OPEN SURGICAL REPAIR OF GLUTEAL TENDON  01/17/2012   Procedure: OPEN SURGICAL REPAIR OF GLUTEAL TENDON;  Surgeon: Mauri Pole, MD;  [facility];  Service: Orthopedics;  Laterality: Right;  . TOTAL KNEE ARTHROPLASTY Right 04/15/2020   Procedure: TOTAL KNEE ARTHROPLASTY;  Surgeon: Paralee Cancel, MD;  [facility];  Service: Orthopedics;  Laterality: Right;  70 mins    Family History  Problem Relation Age of Onset  . Cancer Mother        breast  . Breast cancer Mother 77  . Kidney disease Father   . Epilepsy Father   . Heart attack Sister   . Heart attack Sister   . Diabetes Son     SOCIAL HX: Non-smoker   Current  Outpatient Medications:  .  nirmatrelvir/ritonavir EUA (PAXLOVID) TABS, Patient GFR is > 60 Take nirmatrelvir (150 mg) 2 tablet(s) twice daily for 5 days and ritonavir (100 mg) one tablet twice daily for 5 days., Disp: 30 tablet, Rfl: 0 .  B-D TB SYRINGE 1CC/27GX1/2" 27G X 1/2" 1 ML MISC, , Disp: , Rfl:  .  Biotin 10 MG CAPS, Take 10 mg by mouth daily., Disp: , Rfl:  .  celecoxib (CELEBREX) 200 MG capsule, Take 1 capsule (200 mg total) by mouth 2 (two) times daily., Disp: 60 capsule, Rfl: 0 .  docusate sodium (COLACE) 100 MG capsule, Take 1 capsule (100 mg total) by mouth 2 (two) times daily., Disp: 28 capsule, Rfl: 0 .  EPINEPHrine (EPIPEN 2-PAK) 0.3 mg/0.3 mL IJ SOAJ injection, Inject 0.3 mLs (0.3 mg total) into the muscle once. (Patient taking differently: Inject 0.3 mg into the muscle as needed for anaphylaxis.), Disp: 2 each, Rfl: 1 .  famotidine (PEPCID) 40 MG tablet, Take 40 mg by mouth at bedtime., Disp: , Rfl:  .  ferrous sulfate (FERROUSUL) 325 (65 FE) MG tablet, Take 1 tablet (325 mg total) by mouth 3 (three) times daily with meals for 14 days., Disp: 42 tablet, Rfl: 0 .  folic acid (FOLVITE) 010 MCG tablet, Take 1,600 mcg by mouth daily., Disp: , Rfl:  .  HYDROcodone-acetaminophen (NORCO) 7.5-325 MG tablet, Take 1-2 tablets by mouth every 4 (four) hours as needed for moderate pain., Disp: 60 tablet, Rfl: 0 .  methocarbamol (ROBAXIN) 500 MG tablet, Take 1 tablet (500 mg total) by mouth every 6 (six) hours as needed for muscle spasms., Disp: 40 tablet, Rfl: 0 .  ondansetron (ZOFRAN) 4 MG tablet, Take 1 tablet (4 mg total) by mouth every 8 (eight) hours as needed for nausea or vomiting., Disp: 20 tablet, Rfl: 1 .  pantoprazole (PROTONIX) 40 MG tablet, Take 40 mg by mouth daily before breakfast., Disp: , Rfl:  .  polyethylene glycol (MIRALAX / GLYCOLAX) 17 g packet, Take 17 g by mouth 2 (two) times daily., Disp: 28 packet, Rfl: 0 .  predniSONE (DELTASONE) 5 MG tablet, Take 5 mg by mouth daily  with breakfast., Disp: , Rfl:  .  rosuvastatin (CRESTOR) 20 MG tablet, Take 1 tablet (20 mg total) by mouth daily., Disp: 90 tablet, Rfl: 3 .  scopolamine (TRANSDERM-SCOP) 1 MG/3DAYS, Place 1 patch (1.5 mg total) onto the skin every 3 (three) days. Behind ear (Patient taking differently: Place 1 patch onto the skin See admin instructions. Apply 1 patch behind ear every 72 hours as needed when traveling), Disp: 10 patch, Rfl: 0 .  sertraline (ZOLOFT) 50 MG tablet, TAKE 1 & 1/2 (ONE & ONE-HALF) TABLETS BY MOUTH  ONCE DAILY, Disp: 135 tablet, Rfl: 0 .  zolpidem (AMBIEN) 10 MG tablet, TAKE 1 TABLET BY MOUTH AT BEDTIME AS NEEDED FOR SLEEP, Disp: 90 tablet, Rfl: 0  EXAM:  VITALS per patient if applicable:  GENERAL: alert, oriented, appears well and in no acute distress  HEENT: atraumatic, conjunttiva clear, no obvious abnormalities on inspection of external nose and ears  NECK: normal movements of the head and neck  LUNGS: on inspection no signs of respiratory distress, breathing rate appears normal, no obvious gross SOB, gasping or wheezing  CV: no obvious cyanosis  MS: moves all visible extremities without noticeable abnormality  PSYCH/NEURO: pleasant and cooperative, no obvious depression or anxiety, speech and thought processing grossly intact  ASSESSMENT AND PLAN:  Discussed the following assessment and plan:  COVID-19 diagnosis.  Onset of symptoms was 4-26/2022.  -Discussed supportive care with plenty fluids and rest.  She has pulse oximeter and recommend monitoring regularly at home.  -We discussed options with patient.  We discussed referral for possible monoclonal antibodies and also we discussed options of oral medications.  We decided on the latter after full discussion.  She has history of normal GFR.  We will try Paxilovid-1 dose twice daily for 5 days -Follow-up immediately for any increase shortness of breath or other concerns. -She is aware of recommended isolation  precautions -She knows to go to ER if she has any progressive dyspnea     I discussed the assessment and treatment plan with the patient. The patient was provided an opportunity to ask questions and all were answered. The patient agreed with the plan and demonstrated an understanding of the instructions.   The patient was advised to call back or seek an in-person evaluation if the symptoms worsen or if the condition fails to improve as anticipated.     Carolann Littler, MD

## 2020-06-23 DIAGNOSIS — L814 Other melanin hyperpigmentation: Secondary | ICD-10-CM | POA: Diagnosis not present

## 2020-06-23 DIAGNOSIS — Z85828 Personal history of other malignant neoplasm of skin: Secondary | ICD-10-CM | POA: Diagnosis not present

## 2020-06-23 DIAGNOSIS — M150 Primary generalized (osteo)arthritis: Secondary | ICD-10-CM | POA: Diagnosis not present

## 2020-06-23 DIAGNOSIS — L820 Inflamed seborrheic keratosis: Secondary | ICD-10-CM | POA: Diagnosis not present

## 2020-06-23 DIAGNOSIS — M25561 Pain in right knee: Secondary | ICD-10-CM | POA: Diagnosis not present

## 2020-06-23 DIAGNOSIS — L570 Actinic keratosis: Secondary | ICD-10-CM | POA: Diagnosis not present

## 2020-06-23 DIAGNOSIS — M79672 Pain in left foot: Secondary | ICD-10-CM | POA: Diagnosis not present

## 2020-06-23 DIAGNOSIS — D225 Melanocytic nevi of trunk: Secondary | ICD-10-CM | POA: Diagnosis not present

## 2020-06-23 DIAGNOSIS — M797 Fibromyalgia: Secondary | ICD-10-CM | POA: Diagnosis not present

## 2020-06-23 DIAGNOSIS — M25512 Pain in left shoulder: Secondary | ICD-10-CM | POA: Diagnosis not present

## 2020-06-23 DIAGNOSIS — M0589 Other rheumatoid arthritis with rheumatoid factor of multiple sites: Secondary | ICD-10-CM | POA: Diagnosis not present

## 2020-06-23 DIAGNOSIS — D235 Other benign neoplasm of skin of trunk: Secondary | ICD-10-CM | POA: Diagnosis not present

## 2020-06-23 DIAGNOSIS — L578 Other skin changes due to chronic exposure to nonionizing radiation: Secondary | ICD-10-CM | POA: Diagnosis not present

## 2020-06-23 DIAGNOSIS — D1801 Hemangioma of skin and subcutaneous tissue: Secondary | ICD-10-CM | POA: Diagnosis not present

## 2020-06-23 DIAGNOSIS — L821 Other seborrheic keratosis: Secondary | ICD-10-CM | POA: Diagnosis not present

## 2020-06-23 DIAGNOSIS — G5603 Carpal tunnel syndrome, bilateral upper limbs: Secondary | ICD-10-CM | POA: Diagnosis not present

## 2020-06-23 DIAGNOSIS — M542 Cervicalgia: Secondary | ICD-10-CM | POA: Diagnosis not present

## 2020-06-23 DIAGNOSIS — D485 Neoplasm of uncertain behavior of skin: Secondary | ICD-10-CM | POA: Diagnosis not present

## 2020-07-04 ENCOUNTER — Telehealth: Payer: Self-pay

## 2020-07-04 DIAGNOSIS — Z006 Encounter for examination for normal comparison and control in clinical research program: Secondary | ICD-10-CM

## 2020-07-04 NOTE — Telephone Encounter (Signed)
I have attempted without success to contact this patient by phone for her Identify 90 day follow up phone call. I left a message for patient to return my phone call with my name and callback number. An e-mail was also sent to patient.  

## 2020-07-07 ENCOUNTER — Telehealth: Payer: Self-pay

## 2020-07-07 DIAGNOSIS — Z006 Encounter for examination for normal comparison and control in clinical research program: Secondary | ICD-10-CM

## 2020-07-07 NOTE — Telephone Encounter (Signed)
I called patient for her 90-day Identify Study follow up phone call. Patient is doing well with no cardiac symptoms at this time. I reminded patient I would call her in Dec. for her 1 year follow-up.

## 2020-07-09 ENCOUNTER — Ambulatory Visit: Payer: Medicare Other

## 2020-07-09 ENCOUNTER — Other Ambulatory Visit: Payer: Self-pay

## 2020-07-09 NOTE — Progress Notes (Incomplete)
Virtual Visit via Telephone Note  I connected with  [name] on 07/09/20 at 11:45 AM EDT by telephone and verified that I am speaking with the correct person using two identifiers.  Location: Patient: Home Provider: Office Persons participating in the virtual visit: patient/Nurse Health Advisor   I discussed the limitations, risks, security and privacy concerns of performing an evaluation and management service by telephone and the availability of in person appointments. The patient expressed understanding and agreed to proceed.  Interactive audio and video telecommunications were attempted between this nurse and patient, however failed, due to patient having technical difficulties OR patient did not have access to video capability.  We continued and completed visit with audio only.  Some vital signs may be absent or patient reported.   Willette Brace, LPN    Subjective:   [name] is a 65 y.o. female who presents for an Initial Medicare Annual Wellness Visit.  Review of Systems           Objective:    There were no vitals filed for this visit. There is no height or weight on file to calculate BMI.  Advanced Directives 05/30/2020 04/22/2020 04/15/2020 04/08/2020 07/20/2019 06/30/2015 03/10/2015  Does Patient Have a Medical Advance Directive? No No No No No No No  Would patient like information on creating a medical advance directive? - - No - Patient declined - - - -  Pre-existing out of facility DNR order (yellow form or pink MOST form) - - - - - - -    Current Medications (verified) Outpatient Encounter Medications as of 07/09/2020  Medication Sig  . B-D TB SYRINGE 1CC/27GX1/2" 27G X 1/2" 1 ML MISC   . Biotin 10 MG CAPS Take 10 mg by mouth daily.  . celecoxib (CELEBREX) 200 MG capsule Take 1 capsule (200 mg total) by mouth 2 (two) times daily.  Marland Kitchen docusate sodium (COLACE) 100 MG capsule Take 1 capsule (100 mg total) by mouth 2 (two) times daily.  Marland Kitchen EPINEPHrine  (EPIPEN 2-PAK) 0.3 mg/0.3 mL IJ SOAJ injection Inject 0.3 mLs (0.3 mg total) into the muscle once. (Patient taking differently: Inject 0.3 mg into the muscle as needed for anaphylaxis.)  . famotidine (PEPCID) 40 MG tablet Take 40 mg by mouth at bedtime.  . ferrous sulfate (FERROUSUL) 325 (65 FE) MG tablet Take 1 tablet (325 mg total) by mouth 3 (three) times daily with meals for 14 days.  . folic acid (FOLVITE) 240 MCG tablet Take 1,600 mcg by mouth daily.  Marland Kitchen HYDROcodone-acetaminophen (NORCO) 7.5-325 MG tablet Take 1-2 tablets by mouth every 4 (four) hours as needed for moderate pain.  . methocarbamol (ROBAXIN) 500 MG tablet Take 1 tablet (500 mg total) by mouth every 6 (six) hours as needed for muscle spasms.  . nirmatrelvir/ritonavir EUA (PAXLOVID) TABS Patient GFR is > 60 Take nirmatrelvir (150 mg) 2 tablet(s) twice daily for 5 days and ritonavir (100 mg) one tablet twice daily for 5 days.  . ondansetron (ZOFRAN) 4 MG tablet Take 1 tablet (4 mg total) by mouth every 8 (eight) hours as needed for nausea or vomiting.  . pantoprazole (PROTONIX) 40 MG tablet Take 40 mg by mouth daily before breakfast.  . polyethylene glycol (MIRALAX / GLYCOLAX) 17 g packet Take 17 g by mouth 2 (two) times daily.  . predniSONE (DELTASONE) 5 MG tablet Take 5 mg by mouth daily with breakfast.  . rosuvastatin (CRESTOR) 20 MG tablet Take 1 tablet (20 mg total) by  mouth daily.  Marland Kitchen scopolamine (TRANSDERM-SCOP) 1 MG/3DAYS Place 1 patch (1.5 mg total) onto the skin every 3 (three) days. Behind ear (Patient taking differently: Place 1 patch onto the skin See admin instructions. Apply 1 patch behind ear every 72 hours as needed when traveling)  . sertraline (ZOLOFT) 50 MG tablet TAKE 1 & 1/2 (ONE & ONE-HALF) TABLETS BY MOUTH ONCE DAILY  . zolpidem (AMBIEN) 10 MG tablet TAKE 1 TABLET BY MOUTH AT BEDTIME AS NEEDED FOR SLEEP   No facility-administered encounter medications on file as of 07/09/2020.    Allergies (verified) Bee  venom, Keflex [cephalexin], Codeine sulfate, and Penicillins   History: Past Medical History:  Diagnosis Date  . ANGIOMA 10/03/2008   REMOVED FROM RIGHT LOWER LEG-BENIGN  . Anxiety   . Aortic atherosclerosis ([place])   . Arthritis    RA  . Bronchitis    hx of   . Cancer (HCC)    BASAL CELL SKIN CANCER  . Complication of anesthesia   . Coronary artery disease   . Depression   . Fibromyalgia   . GERD 08/27/2008  . History of blood transfusion    1995  . History of hiatal hernia    hx of has had surgically repaired  . History of measles   . History of mumps   . History of nonmelanoma skin cancer   . HYPERLIPIDEMIA 08/27/2008  . HYPERTENSION 08/27/2008   pt is currently on no meds   . Pneumonia    hx of   . PONV (postoperative nausea and vomiting)   . Rheumatoid arthritis ([place])   . SEBORRHEIC KERATOSIS 08/27/2008  . Vocal cord paresis    hx of    Past Surgical History:  Procedure Laterality Date  . ABDOMINAL HYSTERECTOMY  1995  . APPENDECTOMY    . bilateral shoulder replacements     . BREAST BIOPSY Left   . BREAST DUCTAL SYSTEM EXCISION  08/16/2011   Procedure: EXCISION DUCTAL SYSTEM BREAST;  Surgeon: Joyice Faster. Cornett, MD;  Location: [facility];  Service: General;  Laterality: Left;  left breast duct excision  . [place]   times 2; 1980 and 1982  . [place]  . CHOLECYSTECTOMY  1997  . EXCISION/RELEASE BURSA HIP Right 10/21/2014   Procedure: OPEN RIGHT HIP BURSECTOMY, EXOSECTOMY;  Surgeon: Paralee Cancel, MD;  Location: [facility];  Service: Orthopedics;  Laterality: Right;  . foot surgery  2004, 2005, 2008   bilat secondary to neuropathy   . JOINT REPLACEMENT  10,12   lt total knee X2  . KNEE ARTHROSCOPY  2007, 2008, 2009  . LAPAROSCOPIC GASTRIC SLEEVE RESECTION N/A 10/30/2013   Procedure: LAPAROSCOPIC GASTRIC SLEEVE RESECTION WITH HIATAL HERNIA REPAIR AND UPPER ENDOSCOPY;  Surgeon: Greer Pickerel, MD;  Location: [facility];  Service: General;   Laterality: N/A;  . NASAL SINUS SURGERY  2013  . OPEN SURGICAL REPAIR OF GLUTEAL TENDON  01/17/2012   Procedure: OPEN SURGICAL REPAIR OF GLUTEAL TENDON;  Surgeon: Mauri Pole, MD;  Location: [facility];  Service: Orthopedics;  Laterality: Right;  . TOTAL KNEE ARTHROPLASTY Right 04/15/2020   Procedure: TOTAL KNEE ARTHROPLASTY;  Surgeon: Paralee Cancel, MD;  Location: [facility];  Service: Orthopedics;  Laterality: Right;  70 mins   Family History  Problem Relation Age of Onset  . Cancer Mother        breast  . Breast cancer Mother 85  . Kidney disease Father   . Epilepsy  Father   . Heart attack Sister   . Heart attack Sister   . Diabetes Son    Social History   Socioeconomic History  . Marital status: Married    Spouse name: Not on file  . Number of children: 2  . Years of education: Not on file  . Highest education level: High school graduate  Occupational History  . Occupation: retired    Comment: Building services engineer  Tobacco Use  . Smoking status: Former Smoker    Packs/day: 0.25    Years: 4.00    Pack years: 1.00    Types: Cigarettes    Quit date: 02/23/1984    Years since quitting: 36.4  . Smokeless tobacco: Never Used  Vaping Use  . Vaping Use: Never used  Substance and Sexual Activity  . Alcohol use: No  . Drug use: No  . Sexual activity: Not on file  Other Topics Concern  . Not on file  Social History Narrative   Lives at home with husband   Disabled   Right handed   Caffeine: 1 cup of coffee daily   Social Determinants of Health   Financial Resource Strain: Not on file  Food Insecurity: Not on file  Transportation Needs: Not on file  Physical Activity: Not on file  Stress: Not on file  Social Connections: Not on file    Tobacco Counseling Counseling given: Not Answered   Clinical Intake:                 Diabetic?No         Activities of Daily Living In your present state of health, do you have any difficulty performing the following  activities: 04/15/2020 04/08/2020  Hearing? N N  Vision? N N  Difficulty concentrating or making decisions? N N  Walking or climbing stairs? N Y  Dressing or bathing? N N  Doing errands, shopping? N N  Some recent data might be hidden    Patient Care Team: Eulas Post, MD as PCP - General Jerline Pain, MD as PCP - Cardiology (Cardiology) Hennie Duos, MD as Consulting Physician (Rheumatology)  Indicate any recent Medical Services you may have received from other than [facility] providers in the past year (date may be approximate).     Assessment:   This is a routine wellness examination for [name].  Hearing/Vision screen No exam data present  Dietary issues and exercise activities discussed:    Goals Addressed   None    Depression Screen PHQ 2/9 Scores 12/05/2019  PHQ - 2 Score 0  PHQ- 9 Score 4    Fall Risk Fall Risk  06/15/2016  Falls in the past year? Yes  Number falls in past yr: 1  Injury with Fall? No  Follow up Falls evaluation completed    FALL RISK PREVENTION PERTAINING TO THE HOME:  Any stairs in or around the home? {YES/NO:21197} If so, are there any without handrails? {YES/NO:21197} Home free of loose throw rugs in walkways, pet beds, electrical cords, etc? Yes  Adequate lighting in your home to reduce risk of falls? Yes   ASSISTIVE DEVICES UTILIZED TO PREVENT FALLS:  Life alert? {YES/NO:21197} Use of a cane, walker or w/c? {YES/NO:21197} Grab bars in the bathroom? {YES/NO:21197} Shower chair or bench in shower? {YES/NO:21197} Elevated toilet seat or a handicapped toilet? {YES/NO:21197}  TIMED UP AND GO:  Was the test performed? No     Cognitive Function:        Immunizations Immunization  History  Administered Date(s) Administered  . Fluad Quad(high Dose 65+) 12/05/2019  . Influenza Split 12/01/2010, 11/12/2011  . Influenza, High Dose Seasonal PF 11/19/2015, 11/25/2016, 12/04/2019  . Influenza,inj,Quad PF,6+ Mos 12/01/2012,  11/09/2013, 11/27/2014, 11/25/2017, 11/13/2018  . Influenza-Unspecified 11/13/2018, 11/14/2018  . Moderna Sars-Covid-2 Vaccination 05/14/2019, 06/11/2019, 11/23/2019  . Pneumococcal Polysaccharide-23 02/22/2006, 12/01/2012  . Td 08/28/2009  . Tdap 11/05/1996  . Zoster Recombinat (Shingrix) 11/13/2018    TDAP status: Due, Education has been provided regarding the importance of this vaccine. Advised may receive this vaccine at local pharmacy or Health Dept. Aware to provide a copy of the vaccination record if obtained from local pharmacy or Health Dept. Verbalized acceptance and understanding.  Flu Vaccine status: Up to date  Pneumococcal vaccine status: Due, Education has been provided regarding the importance of this vaccine. Advised may receive this vaccine at local pharmacy or Health Dept. Aware to provide a copy of the vaccination record if obtained from local pharmacy or Health Dept. Verbalized acceptance and understanding.  Covid-19 vaccine status: Completed vaccines  Qualifies for Shingles Vaccine? Yes   Zostavax completed Yes   Shingrix Completed?: Yes  Screening Tests Health Maintenance  Topic Date Due  . TETANUS/TDAP  12/04/2020 (Originally 08/29/2019)  . PAP SMEAR-Modifier  05/29/2041 (Originally 02/22/2010)  . INFLUENZA VACCINE  09/22/2020  . MAMMOGRAM  10/11/2021  . COLONOSCOPY (Pts 45-9yrs Insurance coverage will need to be confirmed)  09/14/2028  . COVID-19 Vaccine  Completed  . Hepatitis C Screening  Completed  . HIV Screening  Completed  . HPV VACCINES  Aged Out    Health Maintenance  There are no preventive care reminders to display for this patient.  Colorectal cancer screening: Type of screening: Colonoscopy. Completed 09/15/18. Repeat every 10 years  Mammogram status: Completed 10/12/19. Repeat every year  {Bone Density status:21018021}  Lung Cancer Screening: (Low Dose CT Chest recommended if Age 56-80 years, 30 pack-year currently smoking OR have quit  w/in 15years.) {DOES NOT does:27190::"does not"} qualify.   Lung Cancer Screening Referral: ***  Additional Screening:  Hepatitis C Screening: {DOES NOT does:27190::"does not"} qualify; Completed ***  Vision Screening: Recommended annual ophthalmology exams for early detection of glaucoma and other disorders of the eye. Is the patient up to date with their annual eye exam?  {YES/NO:21197} Who is the provider or what is the name of the office in which the patient attends annual eye exams? *** If pt is not established with a provider, would they like to be referred to a provider to establish care? {YES/NO:21197}.   Dental Screening: Recommended annual dental exams for proper oral hygiene  Community Resource Referral / Chronic Care Management: CRR required this visit?  {YES/NO:21197}  CCM required this visit?  {YES/NO:21197}     Plan:     I have personally reviewed and noted the following in the patient's chart:   . Medical and social history . Use of alcohol, tobacco or illicit drugs  . Current medications and supplements including opioid prescriptions. {Opioid [phone]} . Functional ability and status . Nutritional status . Physical activity . Advanced directives . List of other physicians . Hospitalizations, surgeries, and ER visits in previous 12 months . Vitals . Screenings to include cognitive, depression, and falls . Referrals and appointments  In addition, I have reviewed and discussed with patient certain preventive protocols, quality metrics, and best practice recommendations. A written personalized care plan for preventive services as well as general preventive health recommendations were provided to patient.  Willette Brace, LPN   [id number]   Nurse Notes: ***

## 2020-07-11 ENCOUNTER — Ambulatory Visit (INDEPENDENT_AMBULATORY_CARE_PROVIDER_SITE_OTHER): Payer: Medicare Other

## 2020-07-11 DIAGNOSIS — Z0000 Encounter for general adult medical examination without abnormal findings: Secondary | ICD-10-CM | POA: Diagnosis not present

## 2020-07-11 DIAGNOSIS — Z780 Asymptomatic menopausal state: Secondary | ICD-10-CM

## 2020-07-11 NOTE — Patient Instructions (Signed)
[name] , Thank you for taking time to come for your Medicare Wellness Visit. I appreciate your ongoing commitment to your health goals. Please review the following plan we discussed and let me know if I can assist you in the future.   Screening recommendations/referrals: Colonoscopy:current  09/15/2018  Due 2030 Mammogram: current due 10/11/2020 Bone Density: referral completed 07/11/2020 Recommended yearly ophthalmology/optometry visit for glaucoma screening and checkup Recommended yearly dental visit for hygiene and checkup  Vaccinations: Influenza vaccine: current due in fall 2022 Pneumococcal vaccine: completed series  Tdap vaccine: due upon injury  Shingles vaccine: completed series   Advanced directives: will provide copies   Conditions/risks identified: none   Next appointment: none   Preventive Care 40-64 Years, Female Preventive care refers to lifestyle choices and visits with your health care provider that can promote health and wellness. What does preventive care include?  A yearly physical exam. This is also called an annual well check.  Dental exams once or twice a year.  Routine eye exams. Ask your health care provider how often you should have your eyes checked.  Personal lifestyle choices, including:  Daily care of your teeth and gums.  Regular physical activity.  Eating a healthy diet.  Avoiding tobacco and drug use.  Limiting alcohol use.  Practicing safe sex.  Taking low-dose aspirin daily starting at age 61.  Taking vitamin and mineral supplements as recommended by your health care provider. What happens during an annual well check? The services and screenings done by your health care provider during your annual well check will depend on your age, overall health, lifestyle risk factors, and family history of disease. Counseling  Your health care provider may ask you questions about your:  Alcohol use.  Tobacco use.  Drug  use.  Emotional well-being.  Home and relationship well-being.  Sexual activity.  Eating habits.  Work and work Statistician.  Method of birth control.  Menstrual cycle.  Pregnancy history. Screening  You may have the following tests or measurements:  Height, weight, and BMI.  Blood pressure.  Lipid and cholesterol levels. These may be checked every 5 years, or more frequently if you are over 57 years old.  Skin check.  Lung cancer screening. You may have this screening every year starting at age 44 if you have a 30-pack-year history of smoking and currently smoke or have quit within the past 15 years.  Fecal occult blood test (FOBT) of the stool. You may have this test every year starting at age 55.  Flexible sigmoidoscopy or colonoscopy. You may have a sigmoidoscopy every 5 years or a colonoscopy every 10 years starting at age 72.  Hepatitis C blood test.  Hepatitis B blood test.  Sexually transmitted disease (STD) testing.  Diabetes screening. This is done by checking your blood sugar (glucose) after you have not eaten for a while (fasting). You may have this done every 1-3 years.  Mammogram. This may be done every 1-2 years. Talk to your health care provider about when you should start having regular mammograms. This may depend on whether you have a family history of breast cancer.  BRCA-related cancer screening. This may be done if you have a family history of breast, ovarian, tubal, or peritoneal cancers.  Pelvic exam and Pap test. This may be done every 3 years starting at age 54. Starting at age 9, this may be done every 5 years if you have a Pap test in combination with an HPV test.  Bone  density scan. This is done to screen for osteoporosis. You may have this scan if you are at high risk for osteoporosis. Discuss your test results, treatment options, and if necessary, the need for more tests with your health care provider. Vaccines  Your health care  provider may recommend certain vaccines, such as:  Influenza vaccine. This is recommended every year.  Tetanus, diphtheria, and acellular pertussis (Tdap, Td) vaccine. You may need a Td booster every 10 years.  Zoster vaccine. You may need this after age 81.  Pneumococcal 13-valent conjugate (PCV13) vaccine. You may need this if you have certain conditions and were not previously vaccinated.  Pneumococcal polysaccharide (PPSV23) vaccine. You may need one or two doses if you smoke cigarettes or if you have certain conditions. Talk to your health care provider about which screenings and vaccines you need and how often you need them. This information is not intended to replace advice given to you by your health care provider. Make sure you discuss any questions you have with your health care provider. Document Released: 03/07/2015 Document Revised: 10/29/2015 Document Reviewed: 12/10/2014 Elsevier Interactive Patient Education  2017 [place] Prevention in the Home Falls can cause injuries. They can happen to people of all ages. There are many things you can do to make your home safe and to help prevent falls. What can I do on the outside of my home?  Regularly fix the edges of walkways and driveways and fix any cracks.  Remove anything that might make you trip as you walk through a door, such as a raised step or threshold.  Trim any bushes or trees on the path to your home.  Use bright outdoor lighting.  Clear any walking paths of anything that might make someone trip, such as rocks or tools.  Regularly check to see if handrails are loose or broken. Make sure that both sides of any steps have handrails.  Any raised decks and porches should have guardrails on the edges.  Have any leaves, snow, or ice cleared regularly.  Use sand or salt on walking paths during winter.  Clean up any spills in your garage right away. This includes oil or grease spills. What can I do  in the bathroom?  Use night lights.  Install grab bars by the toilet and in the tub and shower. Do not use towel bars as grab bars.  Use non-skid mats or decals in the tub or shower.  If you need to sit down in the shower, use a plastic, non-slip stool.  Keep the floor dry. Clean up any water that spills on the floor as soon as it happens.  Remove soap buildup in the tub or shower regularly.  Attach bath mats securely with double-sided non-slip rug tape.  Do not have throw rugs and other things on the floor that can make you trip. What can I do in the bedroom?  Use night lights.  Make sure that you have a light by your bed that is easy to reach.  Do not use any sheets or blankets that are too big for your bed. They should not hang down onto the floor.  Have a firm chair that has side arms. You can use this for support while you get dressed.  Do not have throw rugs and other things on the floor that can make you trip. What can I do in the kitchen?  Clean up any spills right away.  Avoid walking on wet  floors.  Keep items that you use a lot in easy-to-reach places.  If you need to reach something above you, use a strong step stool that has a grab bar.  Keep electrical cords out of the way.  Do not use floor polish or wax that makes floors slippery. If you must use wax, use non-skid floor wax.  Do not have throw rugs and other things on the floor that can make you trip. What can I do with my stairs?  Do not leave any items on the stairs.  Make sure that there are handrails on both sides of the stairs and use them. Fix handrails that are broken or loose. Make sure that handrails are as long as the stairways.  Check any carpeting to make sure that it is firmly attached to the stairs. Fix any carpet that is loose or worn.  Avoid having throw rugs at the top or bottom of the stairs. If you do have throw rugs, attach them to the floor with carpet tape.  Make sure that you  have a light switch at the top of the stairs and the bottom of the stairs. If you do not have them, ask someone to add them for you. What else can I do to help prevent falls?  Wear shoes that:  Do not have high heels.  Have rubber bottoms.  Are comfortable and fit you well.  Are closed at the toe. Do not wear sandals.  If you use a stepladder:  Make sure that it is fully opened. Do not climb a closed stepladder.  Make sure that both sides of the stepladder are locked into place.  Ask someone to hold it for you, if possible.  Clearly mark and make sure that you can see:  Any grab bars or handrails.  First and last steps.  Where the edge of each step is.  Use tools that help you move around (mobility aids) if they are needed. These include:  Canes.  Walkers.  Scooters.  Crutches.  Turn on the lights when you go into a dark area. Replace any light bulbs as soon as they burn out.  Set up your furniture so you have a clear path. Avoid moving your furniture around.  If any of your floors are uneven, fix them.  If there are any pets around you, be aware of where they are.  Review your medicines with your doctor. Some medicines can make you feel dizzy. This can increase your chance of falling. Ask your doctor what other things that you can do to help prevent falls. This information is not intended to replace advice given to you by your health care provider. Make sure you discuss any questions you have with your health care provider. Document Released: 12/05/2008 Document Revised: 07/17/2015 Document Reviewed: 03/15/2014 Elsevier Interactive Patient Education  2017 [employer].

## 2020-07-11 NOTE — Progress Notes (Signed)
Subjective:   [name] is a 65 y.o. female who presents for an Initial Medicare Annual Wellness Visit.   I connected with [name] today by telephone and verified that I am speaking with the correct person using two identifiers. Location patient: home Location provider: work Persons participating in the virtual visit: patient, provider.   I discussed the limitations, risks, security and privacy concerns of performing an evaluation and management service by telephone and the availability of in person appointments. I also discussed with the patient that there may be a patient responsible charge related to this service. The patient expressed understanding and verbally consented to this telephonic visit.    Interactive audio and video telecommunications were attempted between this provider and patient, however failed, due to patient having technical difficulties OR patient did not have access to video capability.  We continued and completed visit with audio only.      Review of Systems    n/a Cardiac Risk Factors include: advanced age (>41men, >12 women);dyslipidemia;hypertension     Objective:    Today's Vitals   There is no height or weight on file to calculate BMI.  Advanced Directives 07/11/2020 05/30/2020 04/22/2020 04/15/2020 04/08/2020 07/20/2019 06/30/2015  Does Patient Have a Medical Advance Directive? No No No No No No No  Would patient like information on creating a medical advance directive? - - - No - Patient declined - - -  Pre-existing out of facility DNR order (yellow form or pink MOST form) - - - - - - -    Current Medications (verified) Outpatient Encounter Medications as of 07/11/2020  Medication Sig  . Biotin 10 MG CAPS Take 10 mg by mouth daily.  Marland Kitchen EPINEPHrine (EPIPEN 2-PAK) 0.3 mg/0.3 mL IJ SOAJ injection Inject 0.3 mLs (0.3 mg total) into the muscle once. (Patient taking differently: Inject 0.3 mg into the muscle as needed for anaphylaxis.)  .  famotidine (PEPCID) 40 MG tablet Take 40 mg by mouth at bedtime.  . folic acid (FOLVITE) [id number] MCG tablet Take 1,600 mcg by mouth daily.  . methocarbamol (ROBAXIN) 500 MG tablet Take 1 tablet (500 mg total) by mouth every 6 (six) hours as needed for muscle spasms.  . ondansetron (ZOFRAN) 4 MG tablet Take 1 tablet (4 mg total) by mouth every 8 (eight) hours as needed for nausea or vomiting.  . pantoprazole (PROTONIX) 40 MG tablet Take 40 mg by mouth daily before breakfast.  . predniSONE (DELTASONE) 5 MG tablet Take 5 mg by mouth daily with breakfast.  . rosuvastatin (CRESTOR) 20 MG tablet Take 1 tablet (20 mg total) by mouth daily.  Marland Kitchen scopolamine (TRANSDERM-SCOP) 1 MG/3DAYS Place 1 patch (1.5 mg total) onto the skin every 3 (three) days. Behind ear (Patient taking differently: Place 1 patch onto the skin See admin instructions. Apply 1 patch behind ear every 72 hours as needed when traveling)  . sertraline (ZOLOFT) 50 MG tablet TAKE 1 & 1/2 (ONE & ONE-HALF) TABLETS BY MOUTH ONCE DAILY  . zolpidem (AMBIEN) 10 MG tablet TAKE 1 TABLET BY MOUTH AT BEDTIME AS NEEDED FOR SLEEP  . B-D TB SYRINGE 1CC/27GX1/2" 27G X 1/2" 1 ML MISC   . celecoxib (CELEBREX) 200 MG capsule Take 1 capsule (200 mg total) by mouth 2 (two) times daily.  Marland Kitchen docusate sodium (COLACE) 100 MG capsule Take 1 capsule (100 mg total) by mouth 2 (two) times daily.  . ferrous sulfate (FERROUSUL) 325 (65 FE) MG tablet Take 1 tablet (325 mg total)  by mouth 3 (three) times daily with meals for 14 days.  Marland Kitchen HYDROcodone-acetaminophen (NORCO) 7.5-325 MG tablet Take 1-2 tablets by mouth every 4 (four) hours as needed for moderate pain.  Marland Kitchen nirmatrelvir/ritonavir EUA (PAXLOVID) TABS Patient GFR is > 60 Take nirmatrelvir (150 mg) 2 tablet(s) twice daily for 5 days and ritonavir (100 mg) one tablet twice daily for 5 days.  . polyethylene glycol (MIRALAX / GLYCOLAX) 17 g packet Take 17 g by mouth 2 (two) times daily.   No facility-administered encounter  medications on file as of 07/11/2020.    Allergies (verified) Bee venom, Keflex [cephalexin], Codeine sulfate, and Penicillins   History: Past Medical History:  Diagnosis Date  . ANGIOMA 10/03/2008   REMOVED FROM RIGHT LOWER LEG-BENIGN  . Anxiety   . Aortic atherosclerosis ([place])   . Arthritis    RA  . Bronchitis    hx of   . Cancer (HCC)    BASAL CELL SKIN CANCER  . Complication of anesthesia   . Coronary artery disease   . Depression   . Fibromyalgia   . GERD 08/27/2008  . History of blood transfusion    1995  . History of hiatal hernia    hx of has had surgically repaired  . History of measles   . History of mumps   . History of nonmelanoma skin cancer   . HYPERLIPIDEMIA 08/27/2008  . HYPERTENSION 08/27/2008   pt is currently on no meds   . Pneumonia    hx of   . PONV (postoperative nausea and vomiting)   . Rheumatoid arthritis ([place])   . SEBORRHEIC KERATOSIS 08/27/2008  . Vocal cord paresis    hx of    Past Surgical History:  Procedure Laterality Date  . ABDOMINAL HYSTERECTOMY  1995  . APPENDECTOMY    . bilateral shoulder replacements     . BREAST BIOPSY Left   . BREAST DUCTAL SYSTEM EXCISION  08/16/2011   Procedure: EXCISION DUCTAL SYSTEM BREAST;  Surgeon: Joyice Faster. Cornett, MD;  Location: [facility];  Service: General;  Laterality: Left;  left breast duct excision  . [place]   times 2; 1980 and 1982  . [place]'s  . CHOLECYSTECTOMY  1997  . EXCISION/RELEASE BURSA HIP Right 10/21/2014   Procedure: OPEN RIGHT HIP BURSECTOMY, EXOSECTOMY;  Surgeon: Paralee Cancel, MD;  Location: [facility];  Service: Orthopedics;  Laterality: Right;  . foot surgery  2004, 2005, 2008   bilat secondary to neuropathy   . JOINT REPLACEMENT  10,12   lt total knee X2  . KNEE ARTHROSCOPY  2007, 2008, 2009  . LAPAROSCOPIC GASTRIC SLEEVE RESECTION N/A 10/30/2013   Procedure: LAPAROSCOPIC GASTRIC SLEEVE RESECTION WITH HIATAL HERNIA REPAIR AND UPPER ENDOSCOPY;   Surgeon: Greer Pickerel, MD;  Location: [facility];  Service: General;  Laterality: N/A;  . NASAL SINUS SURGERY  2013  . OPEN SURGICAL REPAIR OF GLUTEAL TENDON  01/17/2012   Procedure: OPEN SURGICAL REPAIR OF GLUTEAL TENDON;  Surgeon: Mauri Pole, MD;  Location: [facility];  Service: Orthopedics;  Laterality: Right;  . TOTAL KNEE ARTHROPLASTY Right 04/15/2020   Procedure: TOTAL KNEE ARTHROPLASTY;  Surgeon: Paralee Cancel, MD;  Location: [facility];  Service: Orthopedics;  Laterality: Right;  70 mins   Family History  Problem Relation Age of Onset  . Cancer Mother        breast  . Breast cancer Mother 33  . Kidney disease Father   . Epilepsy  Father   . Heart attack Sister   . Heart attack Sister   . Diabetes Son    Social History   Socioeconomic History  . Marital status: Married    Spouse name: Not on file  . Number of children: 2  . Years of education: Not on file  . Highest education level: High school graduate  Occupational History  . Occupation: retired    Comment: Building services engineer  Tobacco Use  . Smoking status: Former Smoker    Packs/day: 0.25    Years: 4.00    Pack years: 1.00    Types: Cigarettes    Quit date: 02/23/1984    Years since quitting: 36.4  . Smokeless tobacco: Never Used  Vaping Use  . Vaping Use: Never used  Substance and Sexual Activity  . Alcohol use: No  . Drug use: No  . Sexual activity: Not on file  Other Topics Concern  . Not on file  Social History Narrative   Lives at home with husband   Disabled   Right handed   Caffeine: 1 cup of coffee daily   Social Determinants of Health   Financial Resource Strain: Low Risk   . Difficulty of Paying Living Expenses: Not hard at all  Food Insecurity: No Food Insecurity  . Worried About Charity fundraiser in the Last Year: Never true  . Ran Out of Food in the Last Year: Never true  Transportation Needs: No Transportation Needs  . Lack of Transportation (Medical): No  . Lack of Transportation  (Non-Medical): No  Physical Activity: Inactive  . Days of Exercise per Week: 0 days  . Minutes of Exercise per Session: 0 min  Stress: No Stress Concern Present  . Feeling of Stress : Not at all  Social Connections: Moderately Integrated  . Frequency of Communication with Friends and Family: Twice a week  . Frequency of Social Gatherings with Friends and Family: Twice a week  . Attends Religious Services: 1 to 4 times per year  . Active Member of Clubs or Organizations: No  . Attends Archivist Meetings: Never  . Marital Status: Married    Tobacco Counseling Counseling given: Not Answered   Clinical Intake:  Pre-visit preparation completed: Yes  Pain : No/denies pain     Nutritional Risks: None Diabetes: No  How often do you need to have someone help you when you read instructions, pamphlets, or other written materials from your doctor or pharmacy?: 1 - Never What is the last grade level you completed in school?: High School  Diabetic?no  Interpreter Needed?: No  Information entered by :: L.Iliza Blankenbeckler,LPN   Activities of Daily Living In your present state of health, do you have any difficulty performing the following activities: 07/11/2020 04/15/2020  Hearing? N N  Vision? N N  Difficulty concentrating or making decisions? N N  Walking or climbing stairs? N N  Dressing or bathing? N N  Doing errands, shopping? N N  Preparing Food and eating ? N -  Using the Toilet? N -  In the past six months, have you accidently leaked urine? N -  Do you have problems with loss of bowel control? N -  Managing your Medications? N -  Managing your Finances? N -  Housekeeping or managing your Housekeeping? N -  Some recent data might be hidden    Patient Care Team: Eulas Post, MD as PCP - General Jerline Pain, MD as PCP - Cardiology (Cardiology) [place],  Irven Easterly, MD as Consulting Physician (Rheumatology)  Indicate any recent Medical Services you may have  received from other than [facility] providers in the past year (date may be approximate).     Assessment:   This is a routine wellness examination for Asley.  Hearing/Vision screen  Hearing Screening   125Hz  250Hz  500Hz  1000Hz  2000Hz  3000Hz  4000Hz  6000Hz  8000Hz   Right ear:           Left ear:           Vision Screening Comments: Annual eye exam wears glasses   Dietary issues and exercise activities discussed: Current Exercise Habits: The patient does not participate in regular exercise at present, Exercise limited by: orthopedic condition(s)  Goals Addressed   None    Depression Screen PHQ 2/9 Scores 07/11/2020 12/05/2019  PHQ - 2 Score 0 0  PHQ- 9 Score - 4    Fall Risk Fall Risk  07/11/2020 06/15/2016  Falls in the past year? 0 Yes  Number falls in past yr: 0 1  Injury with Fall? 0 No  Follow up Falls evaluation completed Falls evaluation completed    [place]:  Any stairs in or around the home? No  If so, are there any without handrails? No  Home free of loose throw rugs in walkways, pet beds, electrical cords, etc? Yes  Adequate lighting in your home to reduce risk of falls? Yes   ASSISTIVE DEVICES UTILIZED TO PREVENT FALLS:  Life alert? No  Use of a cane, walker or w/c? No  Grab bars in the bathroom? No  Shower chair or bench in shower? Yes  Elevated toilet seat or a handicapped toilet? No    Cognitive Function:     Normal cognitive status assessed by direct observation by this Nurse Health Advisor. No abnormalities found.      Immunizations Immunization History  Administered Date(s) Administered  . Fluad Quad(high Dose 65+) 12/05/2019  . Influenza Split 12/01/2010, 11/12/2011  . Influenza, High Dose Seasonal PF 11/19/2015, 11/25/2016, 12/04/2019  . Influenza,inj,Quad PF,6+ Mos 12/01/2012, 11/09/2013, 11/27/2014, 11/25/2017, 11/13/2018  . Influenza-Unspecified 11/13/2018, 11/14/2018  . Moderna Sars-Covid-2 Vaccination  05/14/2019, 06/11/2019, 11/23/2019  . Pneumococcal Polysaccharide-23 02/22/2006, 12/01/2012  . Td 08/28/2009  . Tdap 11/05/1996  . Zoster Recombinat (Shingrix) 11/13/2018    TDAP status: Up to date  Flu Vaccine status: Up to date  Pneumococcal vaccine status: Up to date  Covid-19 vaccine status: Completed vaccines  Qualifies for Shingles Vaccine? Yes   Zostavax completed No   Shingrix Completed?: Yes  Screening Tests Health Maintenance  Topic Date Due  . TETANUS/TDAP  12/04/2020 (Originally 08/29/2019)  . PAP SMEAR-Modifier  05/29/2041 (Originally 02/22/2010)  . INFLUENZA VACCINE  09/22/2020  . MAMMOGRAM  10/11/2020  . COLONOSCOPY (Pts 45-61yrs Insurance coverage will need to be confirmed)  09/14/2028  . COVID-19 Vaccine  Completed  . Hepatitis C Screening  Completed  . HIV Screening  Completed  . HPV VACCINES  Aged Out    Health Maintenance  There are no preventive care reminders to display for this patient.  Colorectal cancer screening: Type of screening: Colonoscopy. Completed 09/15/2018. Repeat every 10 years  Mammogram status: Completed 10/12/2019. Repeat every year  Bone Density status: Ordered 07/11/2020. Pt provided with contact info and advised to call to schedule appt.  Lung Cancer Screening: (Low Dose CT Chest recommended if Age 69-80 years, 30 pack-year currently smoking OR have quit w/in 15years.) does not qualify.   Lung Cancer  Screening Referral: n/a  Additional Screening:  Hepatitis C Screening: does qualify; Completed 05/16/2017  Vision Screening: Recommended annual ophthalmology exams for early detection of glaucoma and other disorders of the eye. Is the patient up to date with their annual eye exam?  Yes  Who is the provider or what is the name of the office in which the patient attends annual eye exams? [facility]  If pt is not established with a provider, would they like to be referred to a provider to establish care? No .   Dental Screening:  Recommended annual dental exams for proper oral hygiene  Community Resource Referral / Chronic Care Management: CRR required this visit?  No   CCM required this visit?  No      Plan:     I have personally reviewed and noted the following in the patient's chart:   . Medical and social history . Use of alcohol, tobacco or illicit drugs  . Current medications and supplements including opioid prescriptions. Patient is not currently taking opioid prescriptions. . Functional ability and status . Nutritional status . Physical activity . Advanced directives . List of other physicians . Hospitalizations, surgeries, and ER visits in previous 12 months . Vitals . Screenings to include cognitive, depression, and falls . Referrals and appointments  In addition, I have reviewed and discussed with patient certain preventive protocols, quality metrics, and best practice recommendations. A written personalized care plan for preventive services as well as general preventive health recommendations were provided to patient.     Randel Pigg, LPN   [id number]   Nurse Notes: none

## 2020-07-14 DIAGNOSIS — M7061 Trochanteric bursitis, right hip: Secondary | ICD-10-CM | POA: Diagnosis not present

## 2020-07-28 ENCOUNTER — Inpatient Hospital Stay: Admission: RE | Admit: 2020-07-28 | Payer: Medicare Other | Source: Ambulatory Visit

## 2020-07-29 ENCOUNTER — Other Ambulatory Visit: Payer: Self-pay

## 2020-07-30 ENCOUNTER — Ambulatory Visit (INDEPENDENT_AMBULATORY_CARE_PROVIDER_SITE_OTHER): Payer: Medicare Other

## 2020-07-30 ENCOUNTER — Other Ambulatory Visit: Payer: Medicare Other

## 2020-07-30 ENCOUNTER — Encounter: Payer: Self-pay | Admitting: Family Medicine

## 2020-07-30 ENCOUNTER — Ambulatory Visit (INDEPENDENT_AMBULATORY_CARE_PROVIDER_SITE_OTHER): Payer: Medicare Other | Admitting: Family Medicine

## 2020-07-30 VITALS — BP 140/80 | HR 71 | Temp 98.0°F | Wt 215.6 lb

## 2020-07-30 DIAGNOSIS — R0789 Other chest pain: Secondary | ICD-10-CM

## 2020-07-30 NOTE — Progress Notes (Signed)
Established Patient Office Visit  Subjective:  Patient ID: [name], female    DOB: 1955-04-18  Age: 65 y.o. MRN: [id number]  CC:  Chief Complaint  Patient presents with  . Breast Pain    R breast near the arm pit, sharp pain x 2 weeks, does not feel any lumps    HPI [name] presents for 2-week history of right chest wall pain actually just lateral to her right breast near her axillary region.  She states is a very sharp pain that has become more constant over the past couple weeks.  She is tried Tylenol and ice without relief.  No rash.  No injury.  She is not felt any lumps or masses.  Her last mammogram was August 2021 and no abnormalities were noted then.  Pain radiates slightly toward the back.  No dyspnea.  No cough.  No pleuritic pain.  Past Medical History:  Diagnosis Date  . ANGIOMA 10/03/2008   REMOVED FROM RIGHT LOWER LEG-BENIGN  . Anxiety   . Aortic atherosclerosis ([place])   . Arthritis    RA  . Bronchitis    hx of   . Cancer (HCC)    BASAL CELL SKIN CANCER  . Complication of anesthesia   . Coronary artery disease   . Depression   . Fibromyalgia   . GERD 08/27/2008  . History of blood transfusion    1995  . History of hiatal hernia    hx of has had surgically repaired  . History of measles   . History of mumps   . History of nonmelanoma skin cancer   . HYPERLIPIDEMIA 08/27/2008  . HYPERTENSION 08/27/2008   pt is currently on no meds   . Pneumonia    hx of   . PONV (postoperative nausea and vomiting)   . Rheumatoid arthritis ([place])   . SEBORRHEIC KERATOSIS 08/27/2008  . Vocal cord paresis    hx of     Past Surgical History:  Procedure Laterality Date  . ABDOMINAL HYSTERECTOMY  1995  . APPENDECTOMY    . bilateral shoulder replacements     . BREAST BIOPSY Left   . BREAST DUCTAL SYSTEM EXCISION  08/16/2011   Procedure: EXCISION DUCTAL SYSTEM BREAST;  Surgeon: Joyice Faster. Cornett, MD;  Location: [facility];  Service: General;   Laterality: Left;  left breast duct excision  . [place]   times 2; 1980 and 1982  . [place]  . CHOLECYSTECTOMY  1997  . EXCISION/RELEASE BURSA HIP Right 10/21/2014   Procedure: OPEN RIGHT HIP BURSECTOMY, EXOSECTOMY;  Surgeon: Paralee Cancel, MD;  Location: [facility];  Service: Orthopedics;  Laterality: Right;  . foot surgery  2004, 2005, 2008   bilat secondary to neuropathy   . JOINT REPLACEMENT  10,12   lt total knee X2  . KNEE ARTHROSCOPY  2007, 2008, 2009  . LAPAROSCOPIC GASTRIC SLEEVE RESECTION N/A 10/30/2013   Procedure: LAPAROSCOPIC GASTRIC SLEEVE RESECTION WITH HIATAL HERNIA REPAIR AND UPPER ENDOSCOPY;  Surgeon: Greer Pickerel, MD;  Location: [facility];  Service: General;  Laterality: N/A;  . NASAL SINUS SURGERY  2013  . OPEN SURGICAL REPAIR OF GLUTEAL TENDON  01/17/2012   Procedure: OPEN SURGICAL REPAIR OF GLUTEAL TENDON;  Surgeon: Mauri Pole, MD;  Location: [facility];  Service: Orthopedics;  Laterality: Right;  . TOTAL KNEE ARTHROPLASTY Right 04/15/2020   Procedure: TOTAL KNEE ARTHROPLASTY;  Surgeon: Paralee Cancel, MD;  Location: [facility];  Service: Orthopedics;  Laterality: Right;  70 mins    Family History  Problem Relation Age of Onset  . Cancer Mother        breast  . Breast cancer Mother 75  . Kidney disease Father   . Epilepsy Father   . Heart attack Sister   . Heart attack Sister   . Diabetes Son     Social History   Socioeconomic History  . Marital status: Married    Spouse name: Not on file  . Number of children: 2  . Years of education: Not on file  . Highest education level: High school graduate  Occupational History  . Occupation: retired    Comment: Building services engineer  Tobacco Use  . Smoking status: Former Smoker    Packs/day: 0.25    Years: 4.00    Pack years: 1.00    Types: Cigarettes    Quit date: 02/23/1984    Years since quitting: 36.4  . Smokeless tobacco: Never Used  Vaping Use  . Vaping Use: Never used  Substance and  Sexual Activity  . Alcohol use: No  . Drug use: No  . Sexual activity: Not on file  Other Topics Concern  . Not on file  Social History Narrative   Lives at home with husband   Disabled   Right handed   Caffeine: 1 cup of coffee daily   Social Determinants of Health   Financial Resource Strain: Low Risk   . Difficulty of Paying Living Expenses: Not hard at all  Food Insecurity: No Food Insecurity  . Worried About Charity fundraiser in the Last Year: Never true  . Ran Out of Food in the Last Year: Never true  Transportation Needs: No Transportation Needs  . Lack of Transportation (Medical): No  . Lack of Transportation (Non-Medical): No  Physical Activity: Inactive  . Days of Exercise per Week: 0 days  . Minutes of Exercise per Session: 0 min  Stress: No Stress Concern Present  . Feeling of Stress : Not at all  Social Connections: Moderately Integrated  . Frequency of Communication with Friends and Family: Twice a week  . Frequency of Social Gatherings with Friends and Family: Twice a week  . Attends Religious Services: 1 to 4 times per year  . Active Member of Clubs or Organizations: No  . Attends Archivist Meetings: Never  . Marital Status: Married  Human resources officer Violence: Not At Risk  . Fear of Current or Ex-Partner: No  . Emotionally Abused: No  . Physically Abused: No  . Sexually Abused: No    Outpatient Medications Prior to Visit  Medication Sig Dispense Refill  . B-D TB SYRINGE 1CC/27GX1/2" 27G X 1/2" 1 ML MISC     . Biotin 10 MG CAPS Take 10 mg by mouth daily.    Marland Kitchen EPINEPHrine (EPIPEN 2-PAK) 0.3 mg/0.3 mL IJ SOAJ injection Inject 0.3 mLs (0.3 mg total) into the muscle once. (Patient taking differently: Inject 0.3 mg into the muscle as needed for anaphylaxis.) 2 each 1  . famotidine (PEPCID) 40 MG tablet Take 40 mg by mouth at bedtime.    . folic acid (FOLVITE) 532 MCG tablet Take 1,600 mcg by mouth daily.    . methocarbamol (ROBAXIN) 500 MG tablet  Take 1 tablet (500 mg total) by mouth every 6 (six) hours as needed for muscle spasms. 40 tablet 0  . ondansetron (ZOFRAN) 4 MG tablet Take 1 tablet (4 mg total) by mouth every  8 (eight) hours as needed for nausea or vomiting. 20 tablet 1  . pantoprazole (PROTONIX) 40 MG tablet Take 40 mg by mouth daily before breakfast.    . predniSONE (DELTASONE) 5 MG tablet Take 5 mg by mouth daily with breakfast.    . rosuvastatin (CRESTOR) 20 MG tablet Take 1 tablet (20 mg total) by mouth daily. 90 tablet 3  . scopolamine (TRANSDERM-SCOP) 1 MG/3DAYS Place 1 patch (1.5 mg total) onto the skin every 3 (three) days. Behind ear (Patient taking differently: Place 1 patch onto the skin See admin instructions. Apply 1 patch behind ear every 72 hours as needed when traveling) 10 patch 0  . sertraline (ZOLOFT) 50 MG tablet TAKE 1 & 1/2 (ONE & ONE-HALF) TABLETS BY MOUTH ONCE DAILY 135 tablet 0  . zolpidem (AMBIEN) 10 MG tablet TAKE 1 TABLET BY MOUTH AT BEDTIME AS NEEDED FOR SLEEP 90 tablet 0  . celecoxib (CELEBREX) 200 MG capsule Take 1 capsule (200 mg total) by mouth 2 (two) times daily. 60 capsule 0  . docusate sodium (COLACE) 100 MG capsule Take 1 capsule (100 mg total) by mouth 2 (two) times daily. 28 capsule 0  . ferrous sulfate (FERROUSUL) 325 (65 FE) MG tablet Take 1 tablet (325 mg total) by mouth 3 (three) times daily with meals for 14 days. 42 tablet 0  . HYDROcodone-acetaminophen (NORCO) 7.5-325 MG tablet Take 1-2 tablets by mouth every 4 (four) hours as needed for moderate pain. 60 tablet 0  . nirmatrelvir/ritonavir EUA (PAXLOVID) TABS Patient GFR is > 60 Take nirmatrelvir (150 mg) 2 tablet(s) twice daily for 5 days and ritonavir (100 mg) one tablet twice daily for 5 days. 30 tablet 0  . polyethylene glycol (MIRALAX / GLYCOLAX) 17 g packet Take 17 g by mouth 2 (two) times daily. 28 packet 0   No facility-administered medications prior to visit.    Allergies  Allergen Reactions  . Bee Venom Anaphylaxis  .  Keflex [Cephalexin] Hives    Pt took po kelfex for sinus infection, developed blisters over entire body  . Codeine Sulfate Other (See Comments)    GI upset  . Penicillins Rash    ROS Review of Systems  Constitutional: Negative for chills, fever and unexpected weight change.  Respiratory: Negative for cough and shortness of breath.   Cardiovascular: Negative for chest pain.  Gastrointestinal: Negative for abdominal pain.  Skin: Negative for rash.      Objective:    Physical Exam Vitals reviewed.  Constitutional:      Appearance: Normal appearance.  Cardiovascular:     Rate and Rhythm: Normal rate and regular rhythm.  Pulmonary:     Effort: Pulmonary effort is normal.     Breath sounds: Normal breath sounds. No wheezing or rales.     Comments: Patient was examined with nurse present.  Right breast reveals no palpable masses.  No skin dimpling.  No breast tenderness but she has some just lateral to the right breast around the mid axillary line region.  She is tender to palpation over couple ribs in that region.  No erythema.  No ecchymosis.  No visible swelling.  No masses palpated.  No axillary adenopathy. Skin:    Findings: No rash.  Neurological:     Mental Status: She is alert.     BP 140/80 (BP Location: Left Arm, Patient Position: Sitting, Cuff Size: Normal)   Pulse 71   Temp 98 F (36.7 C) (Oral)   Wt 215 lb 9.6 oz (  97.8 kg)   SpO2 97%   BMI 37.01 kg/m  Wt Readings from Last 3 Encounters:  07/30/20 215 lb 9.6 oz (97.8 kg)  06/02/20 213 lb 14.4 oz (97 kg)  04/15/20 213 lb 6.4 oz (96.8 kg)     Health Maintenance Due  Topic Date Due  . Pneumococcal Vaccine 70-59 Years old (1 of 4 - PCV13) Never done  . Zoster Vaccines- Shingrix (2 of 2) 01/08/2019    There are no preventive care reminders to display for this patient.  Lab Results  Component Value Date   TSH 2.30 12/05/2019   Lab Results  Component Value Date   WBC 7.3 06/02/2020   HGB 13.4 06/02/2020    HCT 41.3 06/02/2020   MCV 92.0 06/02/2020   PLT 239.0 06/02/2020   Lab Results  Component Value Date   NA 141 04/17/2020   K 4.3 04/17/2020   CO2 22 04/17/2020   GLUCOSE 123 (H) 04/17/2020   BUN 15 04/17/2020   CREATININE 0.55 04/17/2020   BILITOT 0.6 06/02/2020   ALKPHOS 77 06/02/2020   AST 26 06/02/2020   ALT 21 06/02/2020   PROT 6.5 06/02/2020   ALBUMIN 4.2 06/02/2020   CALCIUM 8.8 (L) 04/17/2020   ANIONGAP 11 04/17/2020   GFR 122.98 09/23/2014   Lab Results  Component Value Date   CHOL 169 06/02/2020   Lab Results  Component Value Date   HDL 105.10 06/02/2020   Lab Results  Component Value Date   LDLCALC 42 06/02/2020   Lab Results  Component Value Date   TRIG 112.0 06/02/2020   Lab Results  Component Value Date   CHOLHDL 2 06/02/2020   No results found for: HGBA1C    Assessment & Plan:   Right chest wall pain of unclear etiology.  She has had 2 weeks of pain with nonfocal exam except for some localized tenderness in the right lateral chest wall region.  No visible swelling.  We mention the fact that sometimes shingles can present with pain prior to rash but she has had shingles vaccine so this seems less likely.  This is locally tender to palpation which makes lung etiology unlikely.  -Check chest x-ray and right anterior rib films to start -We discussed further pain medication but she is reluctant at this time.  She has had previous gastric bypass and cannot take nonsteroidals. -Follow-up promptly for any rash, progressive pain, or other concerns.  No orders of the defined types were placed in this encounter.   Follow-up: No follow-ups on file.    Carolann Littler, MD

## 2020-07-30 NOTE — Patient Instructions (Signed)
Follow up for any progressive pain, skin rash, fever, or cough.

## 2020-07-31 ENCOUNTER — Other Ambulatory Visit: Payer: Self-pay | Admitting: Family Medicine

## 2020-07-31 DIAGNOSIS — Z1231 Encounter for screening mammogram for malignant neoplasm of breast: Secondary | ICD-10-CM

## 2020-08-01 ENCOUNTER — Encounter: Payer: Self-pay | Admitting: Family Medicine

## 2020-08-01 NOTE — Telephone Encounter (Signed)
Please advise. I do not see that these have been resulted.

## 2020-08-03 ENCOUNTER — Other Ambulatory Visit: Payer: Self-pay | Admitting: Family Medicine

## 2020-08-05 ENCOUNTER — Encounter: Payer: Self-pay | Admitting: Family Medicine

## 2020-08-05 ENCOUNTER — Other Ambulatory Visit: Payer: Self-pay

## 2020-08-05 MED ORDER — VALACYCLOVIR HCL 1 G PO TABS: 1000.0000 mg | ORAL_TABLET | Freq: Three times a day (TID) | ORAL | 0 refills | Status: DC

## 2020-08-05 NOTE — Progress Notes (Signed)
Medication has been sent to pharmacy.  °

## 2020-08-20 ENCOUNTER — Other Ambulatory Visit: Payer: Self-pay | Admitting: Family Medicine

## 2020-08-21 NOTE — Telephone Encounter (Signed)
Last filled 05/22/2020 Last OV 07/30/2020  Ok to fill?

## 2020-08-27 DIAGNOSIS — M7061 Trochanteric bursitis, right hip: Secondary | ICD-10-CM | POA: Diagnosis not present

## 2020-09-08 DIAGNOSIS — H10013 Acute follicular conjunctivitis, bilateral: Secondary | ICD-10-CM | POA: Diagnosis not present

## 2020-09-15 DIAGNOSIS — M25512 Pain in left shoulder: Secondary | ICD-10-CM | POA: Diagnosis not present

## 2020-09-18 ENCOUNTER — Ambulatory Visit: Payer: Medicare Other | Admitting: Podiatry

## 2020-09-18 ENCOUNTER — Ambulatory Visit (INDEPENDENT_AMBULATORY_CARE_PROVIDER_SITE_OTHER): Payer: Medicare Other

## 2020-09-18 ENCOUNTER — Other Ambulatory Visit: Payer: Self-pay

## 2020-09-18 ENCOUNTER — Other Ambulatory Visit: Payer: Self-pay | Admitting: Podiatry

## 2020-09-18 DIAGNOSIS — M7742 Metatarsalgia, left foot: Secondary | ICD-10-CM

## 2020-09-18 DIAGNOSIS — M778 Other enthesopathies, not elsewhere classified: Secondary | ICD-10-CM | POA: Diagnosis not present

## 2020-09-18 DIAGNOSIS — M779 Enthesopathy, unspecified: Secondary | ICD-10-CM | POA: Diagnosis not present

## 2020-09-18 DIAGNOSIS — M722 Plantar fascial fibromatosis: Secondary | ICD-10-CM | POA: Diagnosis not present

## 2020-09-18 NOTE — Patient Instructions (Signed)

## 2020-09-18 NOTE — Progress Notes (Signed)
Subjective: 65 year old female presents the office today for follow evaluation of left foot pain.  She had to cancel the surgery due to multiple other issues but at this point the foot is hurting she wants to proceed with surgery.  Majority of pain is to the big toe joint on the left side but she also started get some heel pain as well.  No recent injury or trauma any changes.  She tried offloading, padding, shoe modifications and support without any improvement.  No fevers or chills.  No other concerns.   Objective: AAO x3, NAD DP/PT pulses palpable bilaterally, CRT less than 3 seconds Majority tenderness is on the left foot along the first MPJ today.  There is pain with MPJ range of motion there is minimal edema.  Mild tenderness in the sesamoid complex.  Majority tenderness is along the MPJ.  There is tenderness on plantar aspect calcaneus and insertion of plantar fascial.  Plantar fascial appears to be intact.  No other areas of pinpoint tenderness.  MMT 5/5. No pain with calf compression, swelling, warmth, erythema  Assessment: Sesamoiditis, capsulitis left foot first MPJ; Planter fasciitis  Plan: -All treatment options discussed with the patient including all alternatives, risks, complications.  -Again discussed with conservative as well as surgical options.  She wants to proceed with surgery.  Discussed first PJ arthrodesis.  Also proceed with a plantar fascial steroid injection, surgery. -The incision placement as well as the postoperative course was discussed with the patient. I discussed risks of the surgery which include, but not limited to, infection, bleeding, pain, swelling, need for further surgery, delayed or nonhealing, painful or ugly scar, numbness or sensation changes, over/under correction, recurrence, transfer lesions, further deformity, hardware failure, DVT/PE, loss of toe/foot. Patient understands these risks and wishes to proceed with surgery. The surgical consent was  reviewed with the patient all 3 pages were signed. No promises or guarantees were given to the outcome of the procedure. All questions were answered to the best of my ability. Before the surgery the patient was encouraged to call the office if there is any further questions. The surgery will be performed at the [facility] on an outpatient basis.  Return for postop visit .  Trula Slade DPM

## 2020-09-23 DIAGNOSIS — M797 Fibromyalgia: Secondary | ICD-10-CM | POA: Diagnosis not present

## 2020-09-23 DIAGNOSIS — M0589 Other rheumatoid arthritis with rheumatoid factor of multiple sites: Secondary | ICD-10-CM | POA: Diagnosis not present

## 2020-10-01 ENCOUNTER — Telehealth: Payer: Self-pay | Admitting: Urology

## 2020-10-01 NOTE — Telephone Encounter (Signed)
DOS - 10/22/20  HALLUX MPJ FUSION LEFT --- [id number] INJECTION LEFT ---- [id number]   [facility] EFFECTIVE DATE - 02/23/20   PLAN DEDUCTIBLE - $0.00 OUT OF POCKET - $3,600.00 W/ $1,893.61 REMAINING COINSURANCE - 0% COPAY - $295   PER [employer] WEB SITE FOR CPT CODES 29562 AND 13086 Notification or Prior Authorization is not required for the requested services  Decision ID [id number]

## 2020-10-07 ENCOUNTER — Encounter: Payer: Self-pay | Admitting: Family Medicine

## 2020-10-07 MED ORDER — SERTRALINE HCL 100 MG PO TABS: 100.0000 mg | ORAL_TABLET | Freq: Every day | ORAL | 3 refills | Status: DC

## 2020-10-09 DIAGNOSIS — U071 COVID-19: Secondary | ICD-10-CM | POA: Diagnosis not present

## 2020-10-22 ENCOUNTER — Other Ambulatory Visit: Payer: Self-pay | Admitting: Podiatry

## 2020-10-22 ENCOUNTER — Encounter: Payer: Self-pay | Admitting: Podiatry

## 2020-10-22 DIAGNOSIS — M7742 Metatarsalgia, left foot: Secondary | ICD-10-CM

## 2020-10-22 DIAGNOSIS — M722 Plantar fascial fibromatosis: Secondary | ICD-10-CM

## 2020-10-22 DIAGNOSIS — M25572 Pain in left ankle and joints of left foot: Secondary | ICD-10-CM | POA: Diagnosis not present

## 2020-10-22 DIAGNOSIS — M2022 Hallux rigidus, left foot: Secondary | ICD-10-CM | POA: Diagnosis not present

## 2020-10-22 MED ORDER — CLINDAMYCIN HCL 300 MG PO CAPS: 300.0000 mg | ORAL_CAPSULE | Freq: Three times a day (TID) | ORAL | 0 refills | Status: DC

## 2020-10-22 MED ORDER — HYDROCODONE-ACETAMINOPHEN 10-325 MG PO TABS
1.0000 | ORAL_TABLET | Freq: Four times a day (QID) | ORAL | 0 refills | Status: AC | PRN
Start: 2020-10-22 — End: 2020-10-27

## 2020-10-22 MED ORDER — ONDANSETRON HCL 4 MG PO TABS: 4.0000 mg | ORAL_TABLET | Freq: Three times a day (TID) | ORAL | 0 refills | Status: DC | PRN

## 2020-10-22 NOTE — Progress Notes (Signed)
Post-op medications sent  Allergy noted to codeine but spoke with the patient and states she has been on it before and tolerated it.

## 2020-10-23 ENCOUNTER — Telehealth: Payer: Self-pay | Admitting: *Deleted

## 2020-10-23 NOTE — Telephone Encounter (Signed)
Called and spoke with the patient's husband and he stated that she was laying down and over all was doing ok and just got a little nauseated from the pain medicine but does have the nausea medicine to take and I stated to call the office if any concerns or questions or can go to my chart as well. Lattie Haw

## 2020-10-28 ENCOUNTER — Ambulatory Visit (INDEPENDENT_AMBULATORY_CARE_PROVIDER_SITE_OTHER): Payer: Medicare Other | Admitting: Podiatry

## 2020-10-28 ENCOUNTER — Other Ambulatory Visit: Payer: Self-pay

## 2020-10-28 ENCOUNTER — Ambulatory Visit (INDEPENDENT_AMBULATORY_CARE_PROVIDER_SITE_OTHER): Payer: Medicare Other

## 2020-10-28 DIAGNOSIS — M778 Other enthesopathies, not elsewhere classified: Secondary | ICD-10-CM

## 2020-10-28 DIAGNOSIS — M2580 Other specified joint disorders, unspecified joint: Secondary | ICD-10-CM

## 2020-10-28 DIAGNOSIS — Z9889 Other specified postprocedural states: Secondary | ICD-10-CM

## 2020-10-28 DIAGNOSIS — M722 Plantar fascial fibromatosis: Secondary | ICD-10-CM

## 2020-10-28 MED ORDER — HYDROCODONE-ACETAMINOPHEN 10-325 MG PO TABS: 1.0000 | ORAL_TABLET | Freq: Three times a day (TID) | ORAL | 0 refills | Status: AC | PRN

## 2020-10-30 ENCOUNTER — Other Ambulatory Visit: Payer: Self-pay | Admitting: Podiatry

## 2020-11-01 NOTE — Progress Notes (Signed)
Subjective: 65 year old female presents the office today for follow evaluation of left foot pain.  States that she is doing okay.  She states that the foot is still sore and tender having pain just like taking the pain medication.  She completed the course of antibiotics.  She is been nonweightbearing to cam boot.  Denies any fevers or chills.  No other concerns today.  Objective: AAO x3, NAD DP/PT pulses palpable bilaterally, CRT less than 3 seconds LEFT foot: Bandages clean, dry, intact.  Upon removal incision is well coapted without any evidence of dehiscence and sutures are intact.  There is some edema present there is no erythema or warmth.  No drainage or pus or ascending cellulitis.  No fluctuance or crepitation.  No malodor.  The toes in rectus position.  Arthrodesis site appears to be stable. No pain with calf compression, swelling, warmth, erythema  Assessment: Sesamoiditis, capsulitis left foot first MPJ; plantar fasciitis  Plan: -All treatment options discussed with the patient including all alternatives, risks, complications.  -X-rays obtained and reviewed status post first metatarsal phalangeal joint arthrodesis with hardware intact with any complicating factors. -Small amount of antibiotic ointment was applied to the incision followed by dressing.  Keep dressing clean, dry, intact. -Continue nonweightbearing, cam boot. -Pain medication as needed, refill today. -Ice/elevation -Monitor for any clinical signs or symptoms of infection and directed to call the office immediately should any occur or go to the ER.   Trula Slade DPM

## 2020-11-05 ENCOUNTER — Encounter: Payer: Self-pay | Admitting: Family Medicine

## 2020-11-06 ENCOUNTER — Other Ambulatory Visit: Payer: Self-pay

## 2020-11-06 ENCOUNTER — Ambulatory Visit (INDEPENDENT_AMBULATORY_CARE_PROVIDER_SITE_OTHER): Payer: Medicare Other | Admitting: Podiatry

## 2020-11-06 DIAGNOSIS — M722 Plantar fascial fibromatosis: Secondary | ICD-10-CM

## 2020-11-06 DIAGNOSIS — Z9889 Other specified postprocedural states: Secondary | ICD-10-CM

## 2020-11-06 DIAGNOSIS — M778 Other enthesopathies, not elsewhere classified: Secondary | ICD-10-CM

## 2020-11-06 DIAGNOSIS — M2580 Other specified joint disorders, unspecified joint: Secondary | ICD-10-CM

## 2020-11-06 NOTE — Telephone Encounter (Signed)
Please advise 

## 2020-11-10 NOTE — Progress Notes (Signed)
Subjective: 65 year old female presents the office today for follow evaluation after undergoing first MPJ arthrodesis, plantar fascial injection left foot.  She states that her pain has improved.  She still been nonweightbearing in the cam boot.  No fevers or chills.  No other concerns.  Objective: AAO x3, NAD DP/PT pulses palpable bilaterally, CRT less than 3 seconds LEFT foot: Bandages clean, dry, intact.  Incisions well coapted without any evidence of dehiscence.  Sutures are intact.  Edema still noted but appears to be improved.  There is no erythema or warmth there is no drainage or pus or any obvious signs of infection.  The toes in rectus position.  Arthrodesis site is stable. No pain with calf compression, swelling, warmth, erythema  Assessment: Sesamoiditis, capsulitis left foot first MPJ; plantar fasciitis  Plan: -All treatment options discussed with the patient including all alternatives, risks, complications.  -Remove some of the sutures today by left some intact as well to help continue healing.  Incision is clean.  Small amount of antibiotic ointment was applied followed by dressing.  Discussed she can start to wash the foot with soap and water and dry thoroughly and apply a similar bandage. -Continue nonweightbearing, cam boot however around the house she can put weight on her heel for short amount of time if needed -Ice/elevation -Monitor for any clinical signs or symptoms of infection and directed to call the office immediately should any occur or go to the ER.  Return in about 10 days (around 11/16/2020).   Trula Slade DPM

## 2020-11-17 ENCOUNTER — Ambulatory Visit (INDEPENDENT_AMBULATORY_CARE_PROVIDER_SITE_OTHER): Payer: Medicare Other | Admitting: Podiatry

## 2020-11-17 ENCOUNTER — Other Ambulatory Visit: Payer: Self-pay

## 2020-11-17 ENCOUNTER — Ambulatory Visit (INDEPENDENT_AMBULATORY_CARE_PROVIDER_SITE_OTHER): Payer: Medicare Other

## 2020-11-17 DIAGNOSIS — Z9889 Other specified postprocedural states: Secondary | ICD-10-CM | POA: Diagnosis not present

## 2020-11-17 DIAGNOSIS — M2580 Other specified joint disorders, unspecified joint: Secondary | ICD-10-CM

## 2020-11-17 DIAGNOSIS — M778 Other enthesopathies, not elsewhere classified: Secondary | ICD-10-CM

## 2020-11-17 DIAGNOSIS — M722 Plantar fascial fibromatosis: Secondary | ICD-10-CM

## 2020-11-20 ENCOUNTER — Other Ambulatory Visit: Payer: Self-pay | Admitting: Family Medicine

## 2020-11-21 ENCOUNTER — Encounter: Payer: Self-pay | Admitting: Family Medicine

## 2020-11-21 ENCOUNTER — Other Ambulatory Visit: Payer: Self-pay | Admitting: Family Medicine

## 2020-11-21 MED ORDER — ZOLPIDEM TARTRATE 10 MG PO TABS: 10.0000 mg | ORAL_TABLET | Freq: Every evening | ORAL | 0 refills | Status: DC | PRN

## 2020-11-21 NOTE — Telephone Encounter (Signed)
Dr. Elease Hashimoto patient.  Last refill- 08/21/20--90 tabs, 0 refills Last office visit- 07/30/20  No future office visit scheduled.   Can this patient receive a refill?

## 2020-11-21 NOTE — Progress Notes (Signed)
Subjective: 65 year old female presents the office today for follow evaluation after undergoing first MPJ arthrodesis, plantar fascial injection left foot.  She presents today to remove the remainder the sutures.  She has been doing well and not having significant pain.  She has been in the cam boot.  No recent injury or changes.  No fevers or chills.    Objective: AAO x3, NAD DP/PT pulses palpable bilaterally, CRT less than 3 seconds LEFT foot: Incisions well coapted without any evidence of dehiscence.  Sutures are intact.  Edema has improved.  There is no cellulitis noted there is no drainage or pus or any fluctuation, crepitation.  There is no malodor.  Arthrodesis appears to be stable.  Toes in rectus position. No pain with calf compression, swelling, warmth, erythema  Assessment: Sesamoiditis, capsulitis left foot first MPJ; plantar fasciitis  Plan: -All treatment options discussed with the patient including all alternatives, risks, complications.  -The remainder the sutures today.  Small amount of antibiotic Was applied followed by dressing.  She can wash the foot with soap and water and dry thoroughly and apply a similar bandage.  Continuing cam boot.  Ice elevation.  Return in about 2 weeks (around 12/01/2020).  Repeat x-rays  Trula Slade DPM

## 2020-11-24 ENCOUNTER — Encounter: Payer: Medicare Other | Admitting: Podiatry

## 2020-11-25 ENCOUNTER — Encounter: Payer: Medicare Other | Admitting: Podiatry

## 2020-11-26 ENCOUNTER — Telehealth (INDEPENDENT_AMBULATORY_CARE_PROVIDER_SITE_OTHER): Payer: Medicare Other | Admitting: Family Medicine

## 2020-11-26 DIAGNOSIS — J0111 Acute recurrent frontal sinusitis: Secondary | ICD-10-CM | POA: Diagnosis not present

## 2020-11-26 MED ORDER — LEVOFLOXACIN 500 MG PO TABS: 500.0000 mg | ORAL_TABLET | Freq: Every day | ORAL | 0 refills | Status: AC

## 2020-11-26 NOTE — Progress Notes (Signed)
Patient ID: [name], female   DOB: 04/10/1955, 65 y.o.   MRN: [id number]  This visit type was conducted due to national recommendations for restrictions regarding the COVID-19 pandemic in an effort to limit this patient's exposure and mitigate transmission in our community.   Virtual Visit via Video Note  I connected with [name] on 11/26/20 at  5:30 PM EDT by a video enabled telemedicine application and verified that I am speaking with the correct person using two identifiers.  Location patient: home Location provider:work or home office Persons participating in the virtual visit: patient, provider  I discussed the limitations of evaluation and management by telemedicine and the availability of in person appointments. The patient expressed understanding and agreed to proceed.   HPI:  [name] has longstanding history of complicated sinusitis.  She has had sphenoid sinusitis in the past requiring surgery.  She relates several day history of frontal and maxillary sinus pressure and pain with frequent headaches.  Intermittent sore throat.  No fever.  No sick contacts.  She is had some nasal congestion and some colored nasal discharge.  She states these are exactly like the symptoms she has had with sinusitis in the past.  She is penicillin allergic.  She has tolerated Levaquin well in the past.  She also has allergy to Keflex.   ROS: See pertinent positives and negatives per HPI.  Past Medical History:  Diagnosis Date   ANGIOMA 10/03/2008   REMOVED FROM RIGHT LOWER LEG-BENIGN   Anxiety    Aortic atherosclerosis (HCC)    Arthritis    RA   Bronchitis    hx of    Cancer (HCC)    BASAL CELL SKIN CANCER   Complication of anesthesia    Coronary artery disease    Depression    Fibromyalgia    GERD 08/27/2008   History of blood transfusion    1995   History of hiatal hernia    hx of has had surgically repaired   History of measles    History of mumps    History of  nonmelanoma skin cancer    HYPERLIPIDEMIA 08/27/2008   HYPERTENSION 08/27/2008   pt is currently on no meds    Pneumonia    hx of    PONV (postoperative nausea and vomiting)    Rheumatoid arthritis ([place])    SEBORRHEIC KERATOSIS 08/27/2008   Vocal cord paresis    hx of     Past Surgical History:  Procedure Laterality Date   ABDOMINAL HYSTERECTOMY  1995   APPENDECTOMY     bilateral shoulder replacements      BREAST BIOPSY Left    BREAST DUCTAL SYSTEM EXCISION  08/16/2011   Procedure: EXCISION DUCTAL SYSTEM BREAST;  Surgeon: Marcello Moores A. Cornett, MD;  Location: [place];  Service: General;  Laterality: Left;  left breast duct excision   CESAREAN SECTION  1980   times 2; 1980 and [place] Right 10/21/2014   Procedure: OPEN RIGHT HIP BURSECTOMY, EXOSECTOMY;  Surgeon: Paralee Cancel, MD;  Location: [facility];  Service: Orthopedics;  Laterality: Right;   foot surgery  2004, 2005, 2008   bilat secondary to neuropathy    JOINT REPLACEMENT  10,12   lt total knee X2   KNEE ARTHROSCOPY  2007, 2008, 2009   LAPAROSCOPIC GASTRIC SLEEVE RESECTION N/A 10/30/2013   Procedure: LAPAROSCOPIC GASTRIC SLEEVE RESECTION WITH HIATAL HERNIA REPAIR  AND UPPER ENDOSCOPY;  Surgeon: Greer Pickerel, MD;  Location: [facility];  Service: General;  Laterality: N/A;   NASAL SINUS SURGERY  2013   OPEN SURGICAL REPAIR OF GLUTEAL TENDON  01/17/2012   Procedure: OPEN SURGICAL REPAIR OF GLUTEAL TENDON;  Surgeon: Mauri Pole, MD;  Location: [facility];  Service: Orthopedics;  Laterality: Right;   TOTAL KNEE ARTHROPLASTY Right 04/15/2020   Procedure: TOTAL KNEE ARTHROPLASTY;  Surgeon: Paralee Cancel, MD;  Location: [facility];  Service: Orthopedics;  Laterality: Right;  70 mins    Family History  Problem Relation Age of Onset   Cancer Mother        breast   Breast cancer Mother 96   Kidney disease Father    Epilepsy Father    Heart attack Sister     Heart attack Sister    Diabetes Son     SOCIAL HX: Non-smoker   Current Outpatient Medications:    levofloxacin (LEVAQUIN) 500 MG tablet, Take 1 tablet (500 mg total) by mouth daily for 10 days., Disp: 10 tablet, Rfl: 0   B-D TB SYRINGE 1CC/27GX1/2" 27G X 1/2" 1 ML MISC, , Disp: , Rfl:    Biotin 10 MG CAPS, Take 10 mg by mouth daily., Disp: , Rfl:    clindamycin (CLEOCIN) 300 MG capsule, Take 1 capsule (300 mg total) by mouth 3 (three) times daily., Disp: 9 capsule, Rfl: 0   EPINEPHrine (EPIPEN 2-PAK) 0.3 mg/0.3 mL IJ SOAJ injection, Inject 0.3 mLs (0.3 mg total) into the muscle once. (Patient taking differently: Inject 0.3 mg into the muscle as needed for anaphylaxis.), Disp: 2 each, Rfl: 1   famotidine (PEPCID) 40 MG tablet, Take 40 mg by mouth at bedtime., Disp: , Rfl:    folic acid (FOLVITE) 235 MCG tablet, Take 1,600 mcg by mouth daily., Disp: , Rfl:    methocarbamol (ROBAXIN) 500 MG tablet, Take 1 tablet (500 mg total) by mouth every 6 (six) hours as needed for muscle spasms., Disp: 40 tablet, Rfl: 0   ondansetron (ZOFRAN) 4 MG tablet, Take 1 tablet (4 mg total) by mouth every 8 (eight) hours as needed for nausea or vomiting., Disp: 20 tablet, Rfl: 1   ondansetron (ZOFRAN) 4 MG tablet, TAKE 1 TABLET BY MOUTH EVERY 8 HOURS AS NEEDED FOR NAUSEA FOR VOMITING, Disp: 20 tablet, Rfl: 0   pantoprazole (PROTONIX) 40 MG tablet, Take 40 mg by mouth daily before breakfast., Disp: , Rfl:    predniSONE (DELTASONE) 5 MG tablet, Take 5 mg by mouth daily with breakfast., Disp: , Rfl:    rosuvastatin (CRESTOR) 20 MG tablet, Take 1 tablet (20 mg total) by mouth daily., Disp: 90 tablet, Rfl: 3   scopolamine (TRANSDERM-SCOP) 1 MG/3DAYS, Place 1 patch (1.5 mg total) onto the skin every 3 (three) days. Behind ear (Patient taking differently: Place 1 patch onto the skin See admin instructions. Apply 1 patch behind ear every 72 hours as needed when traveling), Disp: 10 patch, Rfl: 0   sertraline (ZOLOFT) 100 MG  tablet, Take 1 tablet (100 mg total) by mouth daily., Disp: 30 tablet, Rfl: 3   valACYclovir (VALTREX) 1000 MG tablet, Take 1 tablet (1,000 mg total) by mouth 3 (three) times daily. For 7 days., Disp: 21 tablet, Rfl: 0   zolpidem (AMBIEN) 10 MG tablet, Take 1 tablet (10 mg total) by mouth at bedtime as needed. for sleep, Disp: 30 tablet, Rfl: 0  EXAM:  VITALS per patient if applicable:  GENERAL: alert, oriented, appears well  and in no acute distress  HEENT: atraumatic, conjunttiva clear, no obvious abnormalities on inspection of external nose and ears  NECK: normal movements of the head and neck  LUNGS: on inspection no signs of respiratory distress, breathing rate appears normal, no obvious gross SOB, gasping or wheezing  CV: no obvious cyanosis  MS: moves all visible extremities without noticeable abnormality  PSYCH/NEURO: pleasant and cooperative, no obvious depression or anxiety, speech and thought processing grossly intact  ASSESSMENT AND PLAN:  Discussed the following assessment and plan:   Probable acute recurrent sinusitis involving maxillary and frontal sinuses.  Allergy to penicillin.  Allergy to Keflex. -We discussed starting Levaquin 500 mg daily for 10 days.  She is aware of potential side effects with Levaquin including but not limited to risk of C. difficile colitis and tendinopathy including low risk of tendon rupture  -Stay well-hydrated. -Follow-up promptly for any persistent or worsening symptoms    I discussed the assessment and treatment plan with the patient. The patient was provided an opportunity to ask questions and all were answered. The patient agreed with the plan and demonstrated an understanding of the instructions.   The patient was advised to call back or seek an in-person evaluation if the symptoms worsen or if the condition fails to improve as anticipated.     Carolann Littler, MD

## 2020-11-28 ENCOUNTER — Encounter: Payer: Self-pay | Admitting: Family Medicine

## 2020-12-01 ENCOUNTER — Ambulatory Visit (INDEPENDENT_AMBULATORY_CARE_PROVIDER_SITE_OTHER): Payer: Medicare Other | Admitting: Podiatry

## 2020-12-01 ENCOUNTER — Other Ambulatory Visit: Payer: Self-pay

## 2020-12-01 ENCOUNTER — Ambulatory Visit (INDEPENDENT_AMBULATORY_CARE_PROVIDER_SITE_OTHER): Payer: Medicare Other

## 2020-12-01 DIAGNOSIS — M722 Plantar fascial fibromatosis: Secondary | ICD-10-CM

## 2020-12-01 DIAGNOSIS — Z9889 Other specified postprocedural states: Secondary | ICD-10-CM | POA: Diagnosis not present

## 2020-12-01 NOTE — Progress Notes (Signed)
Subjective: 65 year old female presents the office today for follow evaluation after undergoing first MPJ arthrodesis, plantar fascial injection left foot.  She states that she has been doing well and she not having any significant pain and she has no concerns otherwise today.  No fevers or chills.  No other concerns.  Objective: AAO x3, NAD DP/PT pulses palpable bilaterally, CRT less than 3 seconds LEFT foot: Incisions well coapted without any evidence of dehiscence.  Scar is well formed.  There is mild edema still present but there is improvement of this.  There is no erythema or warmth.  No significant tenderness palpation.  Arthrodesis site appears to be stable.  The toes in rectus position.  No other areas of discomfort. No pain with calf compression, swelling, warmth, erythema  Assessment: Sesamoiditis, capsulitis left foot first MPJ; plantar fasciitis  Plan: -All treatment options discussed with the patient including all alternatives, risks, complications.  -Repeat x-rays obtained and reviewed.  Increased consolidation noted across the arthrodesis site with hardware intact without any complicating factors. -At this time we discussed transition to regular shoe as tolerated with gradual transition.  Discussed gentle stretching, rehab exercises as well.  I will continue with compression sock anklet or Ace bandage to help with postoperative edema.  Continue to ice and elevate as well.  Return in about 3 weeks (around 12/22/2020).  X-ray next appointment  Trula Slade DPM

## 2020-12-03 ENCOUNTER — Other Ambulatory Visit: Payer: Self-pay

## 2020-12-03 ENCOUNTER — Telehealth (INDEPENDENT_AMBULATORY_CARE_PROVIDER_SITE_OTHER): Payer: Medicare Other | Admitting: Family Medicine

## 2020-12-03 DIAGNOSIS — J0191 Acute recurrent sinusitis, unspecified: Secondary | ICD-10-CM | POA: Diagnosis not present

## 2020-12-03 MED ORDER — PREDNISONE 20 MG PO TABS: ORAL_TABLET | ORAL | 0 refills | Status: DC

## 2020-12-03 NOTE — Progress Notes (Signed)
Patient ID: [name], female   DOB: 07/16/1955, 65 y.o.   MRN: [id number]   This visit type was conducted due to national recommendations for restrictions regarding the COVID-19 pandemic in an effort to limit this patient's exposure and mitigate transmission in our community.   Virtual Visit via Video Note  I connected with [name] on 12/03/20 at  8:30 AM EDT by a video enabled telemedicine application and verified that I am speaking with the correct person using two identifiers.  Location patient: home Location provider:work or home office Persons participating in the virtual visit: patient, provider  I discussed the limitations of evaluation and management by telemedicine and the availability of in person appointments. The patient expressed understanding and agreed to proceed.   HPI:  [name] has longstanding history of sinus disease.  Back in 2012 she had left sphenoid sinusitis bilateral maxillary sinusitis which did not respond to broad-spectrum antibiotics.  She eventually had surgery and improved.  Back in 2019 she had MRI of the brain which showed no active sinus disease.  She now has several weeks of bilateral maxillary facial pain and daily headaches.  No fever.  Increased pain and pressure over her frontal and maxillary sinuses.  She has seen ENT over at [facility] recently regarding some vocal cord issues.  She is currently finishing up a 10-day course of Levaquin.  She is allergic to penicillin and Keflex.  No cough.  She feels very similar to when she had her surgery back in 2012.  Does have significant nasal congestion.  ROS: See pertinent positives and negatives per HPI.  Past Medical History:  Diagnosis Date   ANGIOMA 10/03/2008   REMOVED FROM RIGHT LOWER LEG-BENIGN   Anxiety    Aortic atherosclerosis (HCC)    Arthritis    RA   Bronchitis    hx of    Cancer (HCC)    BASAL CELL SKIN CANCER   Complication of anesthesia    Coronary artery disease     Depression    Fibromyalgia    GERD 08/27/2008   History of blood transfusion    1995   History of hiatal hernia    hx of has had surgically repaired   History of measles    History of mumps    History of nonmelanoma skin cancer    HYPERLIPIDEMIA 08/27/2008   HYPERTENSION 08/27/2008   pt is currently on no meds    Pneumonia    hx of    PONV (postoperative nausea and vomiting)    Rheumatoid arthritis ([place])    SEBORRHEIC KERATOSIS 08/27/2008   Vocal cord paresis    hx of     Past Surgical History:  Procedure Laterality Date   ABDOMINAL HYSTERECTOMY  1995   APPENDECTOMY     bilateral shoulder replacements      BREAST BIOPSY Left    BREAST DUCTAL SYSTEM EXCISION  08/16/2011   Procedure: EXCISION DUCTAL SYSTEM BREAST;  Surgeon: Marcello Moores A. Cornett, MD;  Location: [place];  Service: General;  Laterality: Left;  left breast duct excision   CESAREAN SECTION  1980   times 2; 1980 and [place] Right 10/21/2014   Procedure: OPEN RIGHT HIP BURSECTOMY, EXOSECTOMY;  Surgeon: Paralee Cancel, MD;  Location: [facility];  Service: Orthopedics;  Laterality: Right;   foot surgery  2004, 2005, 2008   bilat secondary to neuropathy  JOINT REPLACEMENT  10,12   lt total knee X2   KNEE ARTHROSCOPY  2007, 2008, 2009   LAPAROSCOPIC GASTRIC SLEEVE RESECTION N/A 10/30/2013   Procedure: LAPAROSCOPIC GASTRIC SLEEVE RESECTION WITH HIATAL HERNIA REPAIR AND UPPER ENDOSCOPY;  Surgeon: Greer Pickerel, MD;  Location[facility];  Service: General;  Laterality: N/A;   NASAL SINUS SURGERY  2013   OPEN SURGICAL REPAIR OF GLUTEAL TENDON  01/17/2012   Procedure: OPEN SURGICAL REPAIR OF GLUTEAL TENDON;  Surgeon: Mauri Pole, MD;  Location: [facility];  Service: Orthopedics;  Laterality: Right;   TOTAL KNEE ARTHROPLASTY Right 04/15/2020   Procedure: TOTAL KNEE ARTHROPLASTY;  Surgeon: Paralee Cancel, MD;  Location: [facility];  Service: Orthopedics;   Laterality: Right;  70 mins    Family History  Problem Relation Age of Onset   Cancer Mother        breast   Breast cancer Mother 56   Kidney disease Father    Epilepsy Father    Heart attack Sister    Heart attack Sister    Diabetes Son     SOCIAL HX: Non-smoker   Current Outpatient Medications:    B-D TB SYRINGE 1CC/27GX1/2" 27G X 1/2" 1 ML MISC, , Disp: , Rfl:    Biotin 10 MG CAPS, Take 10 mg by mouth daily., Disp: , Rfl:    clindamycin (CLEOCIN) 300 MG capsule, Take 1 capsule (300 mg total) by mouth 3 (three) times daily., Disp: 9 capsule, Rfl: 0   EPINEPHrine (EPIPEN 2-PAK) 0.3 mg/0.3 mL IJ SOAJ injection, Inject 0.3 mLs (0.3 mg total) into the muscle once. (Patient taking differently: Inject 0.3 mg into the muscle as needed for anaphylaxis.), Disp: 2 each, Rfl: 1   famotidine (PEPCID) 40 MG tablet, Take 40 mg by mouth at bedtime., Disp: , Rfl:    folic acid (FOLVITE) 979 MCG tablet, Take 1,600 mcg by mouth daily., Disp: , Rfl:    levofloxacin (LEVAQUIN) 500 MG tablet, Take 1 tablet (500 mg total) by mouth daily for 10 days., Disp: 10 tablet, Rfl: 0   methocarbamol (ROBAXIN) 500 MG tablet, Take 1 tablet (500 mg total) by mouth every 6 (six) hours as needed for muscle spasms., Disp: 40 tablet, Rfl: 0   ondansetron (ZOFRAN) 4 MG tablet, Take 1 tablet (4 mg total) by mouth every 8 (eight) hours as needed for nausea or vomiting., Disp: 20 tablet, Rfl: 1   ondansetron (ZOFRAN) 4 MG tablet, TAKE 1 TABLET BY MOUTH EVERY 8 HOURS AS NEEDED FOR NAUSEA FOR VOMITING, Disp: 20 tablet, Rfl: 0   pantoprazole (PROTONIX) 40 MG tablet, Take 40 mg by mouth daily before breakfast., Disp: , Rfl:    predniSONE (DELTASONE) 5 MG tablet, Take 5 mg by mouth daily with breakfast., Disp: , Rfl:    rosuvastatin (CRESTOR) 20 MG tablet, Take 1 tablet (20 mg total) by mouth daily., Disp: 90 tablet, Rfl: 3   scopolamine (TRANSDERM-SCOP) 1 MG/3DAYS, Place 1 patch (1.5 mg total) onto the skin every 3 (three) days.  Behind ear (Patient taking differently: Place 1 patch onto the skin See admin instructions. Apply 1 patch behind ear every 72 hours as needed when traveling), Disp: 10 patch, Rfl: 0   sertraline (ZOLOFT) 100 MG tablet, Take 1 tablet (100 mg total) by mouth daily., Disp: 30 tablet, Rfl: 3   zolpidem (AMBIEN) 10 MG tablet, Take 1 tablet (10 mg total) by mouth at bedtime as needed. for sleep, Disp: 30 tablet, Rfl: 0  EXAM:  VITALS  per patient if applicable:  GENERAL: alert, oriented, appears well and in no acute distress  HEENT: atraumatic, conjunttiva clear, no obvious abnormalities on inspection of external nose and ears  NECK: normal movements of the head and neck  LUNGS: on inspection no signs of respiratory distress, breathing rate appears normal, no obvious gross SOB, gasping or wheezing  CV: no obvious cyanosis  MS: moves all visible extremities without noticeable abnormality  PSYCH/NEURO: pleasant and cooperative, no obvious depression or anxiety, speech and thought processing grossly intact  ASSESSMENT AND PLAN:  Discussed the following assessment and plan:  Acute recurrent sinusitis, unspecified location Patient has not seen any clinical response with broad-spectrum Levaquin.  She has 2 more days of prescription.  Does not have any fever but has had daily headache and sinus pressure which is not improving over several days now.  Prior history of sinus surgery as above.  -Finish out Levaquin -Add prednisone 20 mg 2 tablets daily for 6 days and then resume her daily chronic 5 mg dose of prednisone -Set up CT maxillofacial limited -Consider ENT referral if she has any persistent sinus disease on imaging     I discussed the assessment and treatment plan with the patient. The patient was provided an opportunity to ask questions and all were answered. The patient agreed with the plan and demonstrated an understanding of the instructions.   The patient was advised to call back  or seek an in-person evaluation if the symptoms worsen or if the condition fails to improve as anticipated.     Carolann Littler, MD

## 2020-12-09 DIAGNOSIS — M65342 Trigger finger, left ring finger: Secondary | ICD-10-CM | POA: Insufficient documentation

## 2020-12-09 DIAGNOSIS — M79641 Pain in right hand: Secondary | ICD-10-CM | POA: Insufficient documentation

## 2020-12-09 DIAGNOSIS — G5603 Carpal tunnel syndrome, bilateral upper limbs: Secondary | ICD-10-CM | POA: Diagnosis not present

## 2020-12-18 ENCOUNTER — Other Ambulatory Visit: Payer: Medicare Other

## 2020-12-19 DIAGNOSIS — G5601 Carpal tunnel syndrome, right upper limb: Secondary | ICD-10-CM | POA: Diagnosis not present

## 2020-12-21 ENCOUNTER — Encounter: Payer: Self-pay | Admitting: Family Medicine

## 2020-12-22 ENCOUNTER — Other Ambulatory Visit: Payer: Self-pay

## 2020-12-22 ENCOUNTER — Ambulatory Visit (INDEPENDENT_AMBULATORY_CARE_PROVIDER_SITE_OTHER): Payer: Medicare Other

## 2020-12-22 ENCOUNTER — Encounter: Payer: Self-pay | Admitting: Podiatry

## 2020-12-22 ENCOUNTER — Ambulatory Visit (INDEPENDENT_AMBULATORY_CARE_PROVIDER_SITE_OTHER): Payer: Medicare Other | Admitting: Podiatry

## 2020-12-22 DIAGNOSIS — M25872 Other specified joint disorders, left ankle and foot: Secondary | ICD-10-CM

## 2020-12-22 DIAGNOSIS — M722 Plantar fascial fibromatosis: Secondary | ICD-10-CM

## 2020-12-22 DIAGNOSIS — Z9889 Other specified postprocedural states: Secondary | ICD-10-CM

## 2020-12-22 DIAGNOSIS — M1991 Primary osteoarthritis, unspecified site: Secondary | ICD-10-CM | POA: Insufficient documentation

## 2020-12-22 MED ORDER — ZOLPIDEM TARTRATE 10 MG PO TABS: 10.0000 mg | ORAL_TABLET | Freq: Every evening | ORAL | 1 refills | Status: DC | PRN

## 2020-12-22 NOTE — Telephone Encounter (Signed)
Last filled 11/21/2020 Last OV 12/03/2020  Ok to fill?

## 2020-12-24 NOTE — Progress Notes (Signed)
Subjective: 65 year old female presents the office today for follow evaluation after undergoing first MPJ arthrodesis, plantar fascial injection left foot.  She said overall she is doing well does not have any significant pain.  She is back to wearing regular shoe when she is doing activities that she wants to do without any restrictions.  Minimal swelling still evident but no other concerns.  No recent injury.  No fevers or chills.  No other concerns.   Objective: AAO x3, NAD DP/PT pulses palpable bilaterally, CRT less than 3 seconds LEFT foot: Incisions well coapted without any evidence of dehiscence.  Scar is well formed.  There is minimal edema.  There is no erythema or warmth.  Arthrodesis site is stable.  No tenderness palpation to the left foot on surgical sites.  MMT 5/5. No pain with calf compression, swelling, warmth, erythema  Assessment: Sesamoiditis, capsulitis left foot first MPJ; plantar fasciitis  Plan: -All treatment options discussed with the patient including all alternatives, risks, complications.  -X-rays obtained and reviewed.  There is no evidence of acute fracture.  Hardware intact status post first metatarsophalangeal joint arthrodesis.  Increased consolidation noted across the arthrodesis site. -She is back to regular shoe without any issues.  Discussed gradual increase activity level.  At this point she is doing well may discharge from the postoperative course and she agrees.  Discussed this any changes at all I would be happy to see her back as needed.  Trula Slade DPM

## 2020-12-25 DIAGNOSIS — M797 Fibromyalgia: Secondary | ICD-10-CM | POA: Diagnosis not present

## 2020-12-25 DIAGNOSIS — M150 Primary generalized (osteo)arthritis: Secondary | ICD-10-CM | POA: Diagnosis not present

## 2020-12-25 DIAGNOSIS — M0589 Other rheumatoid arthritis with rheumatoid factor of multiple sites: Secondary | ICD-10-CM | POA: Diagnosis not present

## 2020-12-25 DIAGNOSIS — R5383 Other fatigue: Secondary | ICD-10-CM | POA: Diagnosis not present

## 2020-12-25 DIAGNOSIS — G5603 Carpal tunnel syndrome, bilateral upper limbs: Secondary | ICD-10-CM | POA: Diagnosis not present

## 2020-12-26 DIAGNOSIS — M79641 Pain in right hand: Secondary | ICD-10-CM | POA: Diagnosis not present

## 2020-12-26 DIAGNOSIS — R110 Nausea: Secondary | ICD-10-CM | POA: Diagnosis not present

## 2020-12-26 DIAGNOSIS — G5603 Carpal tunnel syndrome, bilateral upper limbs: Secondary | ICD-10-CM | POA: Diagnosis not present

## 2020-12-26 DIAGNOSIS — G5621 Lesion of ulnar nerve, right upper limb: Secondary | ICD-10-CM | POA: Diagnosis not present

## 2020-12-29 ENCOUNTER — Encounter (HOSPITAL_BASED_OUTPATIENT_CLINIC_OR_DEPARTMENT_OTHER): Payer: Self-pay | Admitting: Orthopedic Surgery

## 2020-12-29 ENCOUNTER — Other Ambulatory Visit: Payer: Self-pay

## 2020-12-29 NOTE — Progress Notes (Signed)
Spoke w/ via phone for pre-op interview---patient Lab needs dos----ISTAT, EKG               Lab results------12/24/2019 EKG in chart for comparison COVID test -----patient states asymptomatic no test needed Arrive at -------1240 on 01/01/21 NPO after MN NO Solid Food.  Clear liquids from MN until---1140 Med rec completed Medications to take morning of surgery -----Zofran prn, Prednisone, Crestor, Zoloft Diabetic medication ----- n/a Patient instructed no nail polish to be worn day of surgery Patient instructed to bring photo id and insurance card day of surgery Patient aware to have Driver (ride ) / caregiver    for 24 hours after surgery - husband Richard Patient Special Instructions -----none Pre-Op special Istructions -----none Patient verbalized understanding of instructions that were given at this phone interview. Patient denies shortness of breath, chest pain, fever, cough at this phone interview.   Rheumatology: Dr. Amil Amen LOV 12/25/2020 per patient in Care Everywhere  ENT: Dr. Rowe [place] 05/13/2020 in [place]  Cardiology: Dr. Marlou Porch LOV 02/26/2020 in Epic 02/08/2020 Echo in [place] - EF 60 - 65% 01/28/2020 Cardiac CT in Epic

## 2020-12-31 NOTE — H&P (Signed)
Preoperative History & Physical Exam  Surgeon: Matt Holmes, MD  Diagnosis: Right carpal and cubital tunnel syndrome  Planned Procedure: Procedure(s) (LRB): CUBITAL TUNNEL RELEASE (Right) CARPAL TUNNEL RELEASE, POSSIBLE TRANSPOSITION (Right)  History of Present Illness:   Patient is a 65 y.o. female with symptoms consistent with  Right carpal and cubital tunnel syndrome who presents for surgical intervention. The risks, benefits and alternatives of surgical intervention were discussed and informed consent was obtained prior to surgery.  Past Medical History:  Past Medical History:  Diagnosis Date   ANGIOMA 10/03/2008   REMOVED FROM RIGHT LOWER LEG-BENIGN   Anxiety    Aortic atherosclerosis (HCC)    Arthritis    RA   Bronchitis    hx of    Cancer (HCC)    BASAL CELL SKIN CANCER   Complication of anesthesia    Coronary artery disease    COVID-19 06/18/2020   HA, chills, myalgias, congestion & fever, all symptoms resolved as of 12/29/20 per patient   Depression    Fibromyalgia    GERD 08/27/2008   History of blood transfusion    1995   History of hiatal hernia    hx of has had surgically repaired   History of measles    History of mumps    History of nonmelanoma skin cancer    HYPERLIPIDEMIA 08/27/2008   HYPERTENSION 08/27/2008   pt is currently on no meds    Pneumonia    hx of    PONV (postoperative nausea and vomiting)    Rheumatoid arthritis ([place])    SEBORRHEIC KERATOSIS 08/27/2008   Sinus headache    Vocal cord paresis    hx of    Wears glasses    Wears partial dentures     Past Surgical History:  Past Surgical History:  Procedure Laterality Date   ABDOMINAL HYSTERECTOMY  1995   APPENDECTOMY     bilateral shoulder replacements      BREAST BIOPSY Left    BREAST DUCTAL SYSTEM EXCISION  08/16/2011   Procedure: EXCISION DUCTAL SYSTEM BREAST;  Surgeon: Marcello Moores A. Cornett, MD;  Location: [facility];  Service: General;  Laterality: Left;   left breast duct excision   CESAREAN SECTION  1980   times 2; 1980 and [place]   COLONOSCOPY  09/15/2018   EXCISION/RELEASE BURSA HIP Right 10/21/2014   Procedure: OPEN RIGHT HIP BURSECTOMY, EXOSECTOMY;  Surgeon: Paralee Cancel, MD;  Location: [facility];  Service: Orthopedics;  Laterality: Right;   foot surgery  2004, 2005, 2008   bilat secondary to neuropathy    JOINT REPLACEMENT  11/2010   lt total knee X2   KNEE ARTHROSCOPY  2007, 2008, 2009   LAPAROSCOPIC GASTRIC SLEEVE RESECTION N/A 10/30/2013   Procedure: LAPAROSCOPIC GASTRIC SLEEVE RESECTION WITH HIATAL HERNIA REPAIR AND UPPER ENDOSCOPY;  Surgeon: Greer Pickerel, MD;  Location: [facility];  Service: General;  Laterality: N/A;   NASAL SINUS SURGERY  2013   OPEN SURGICAL REPAIR OF GLUTEAL TENDON  01/17/2012   Procedure: OPEN SURGICAL REPAIR OF GLUTEAL TENDON;  Surgeon: Mauri Pole, MD;  Location: [facility];  Service: Orthopedics;  Laterality: Right;   TOTAL KNEE ARTHROPLASTY Right 04/15/2020   Procedure: TOTAL KNEE ARTHROPLASTY;  Surgeon: Paralee Cancel, MD;  Location: [facility];  Service: Orthopedics;  Laterality: Right;  70 mins    Medications:  Prior to Admission medications   Medication Sig Start Date End Date  Taking? Authorizing Provider  B-D TB SYRINGE 1CC/27GX1/2" 27G X 1/2" 1 ML MISC  01/29/20   [provider]  Bartolo Darter COVID-19 AG HOME TEST KIT Use as Directed on the Package 10/09/20   [provider]  Biotin 10 MG CAPS Take 10 mg by mouth daily.    [provider]  Cyanocobalamin (VITAMIN B 12) 100 MCG LOZG daily.    [provider]  EPINEPHrine (EPIPEN 2-PAK) 0.3 mg/0.3 mL IJ SOAJ injection Inject 0.3 mLs (0.3 mg total) into the muscle once. Patient taking differently: Inject 0.3 mg into the muscle as needed for anaphylaxis. 10/16/18   Burchette, Alinda Sierras, MD  etanercept (ENBREL SURECLICK) 50 MG/ML injection 50 mg once a week. Last taken 12/19/20. Patient  states that surgeon told her to hold until after surgery on 01/01/21.    [provider]  famotidine (PEPCID) 40 MG tablet Take 40 mg by mouth at bedtime. 01/04/20   [provider]  folic acid (FOLVITE) 076 MCG tablet Take 1,600 mcg by mouth daily.    [provider]  HYDROcodone-acetaminophen (NORCO) 10-325 MG tablet 1 tablet 2 (two) times daily. As needed    [provider]  methotrexate 50 MG/2ML injection Last dose 12/19/20 until after surgery (pt stated she was told by her surgeon to stop until after surgery on 01/01/21)    [provider]  ondansetron (ZOFRAN) 4 MG tablet Take 1 tablet (4 mg total) by mouth every 8 (eight) hours as needed for nausea or vomiting. 04/17/20 04/17/21  Irving Copas, PA-C  predniSONE (DELTASONE) 5 MG tablet Take 5 mg by mouth daily with breakfast. 2 tabs every am 10 mg    [provider]  rosuvastatin (CRESTOR) 20 MG tablet Take 1 tablet (20 mg total) by mouth daily. 01/31/20   Jerline Pain, MD  sertraline (ZOLOFT) 100 MG tablet Take 1 tablet (100 mg total) by mouth daily. 10/07/20   Burchette, Alinda Sierras, MD  zolpidem (AMBIEN) 10 MG tablet Take 1 tablet (10 mg total) by mouth at bedtime as needed. for sleep Patient taking differently: Take 10 mg by mouth at bedtime. for sleep 12/22/20   Eulas Post, MD    Allergies:  Bee venom, Keflex [cephalexin], Codeine sulfate, Penicillamine, and Penicillins  Review of Systems: Negative except per HPI.  Physical Exam: Alert and oriented, NAD Head and neck: no masses, normal alignment CV: pulse intact Pulm: no increased work of breathing, respirations even and unlabored Abdomen: non-distended Extremities: extremities warm and well perfused  LABS: No results found for this or any previous visit (from the past 2160 hour(s)).   Complete History and Physical exam available in the office notes  [name]

## 2020-12-31 NOTE — Progress Notes (Signed)
Talked with pt. Asked to come in at 1015 instead of 1240. Verbalized understanding

## 2021-01-01 ENCOUNTER — Ambulatory Visit (HOSPITAL_BASED_OUTPATIENT_CLINIC_OR_DEPARTMENT_OTHER)
Admission: RE | Admit: 2021-01-01 | Discharge: 2021-01-01 | Disposition: A | Discharge status: Home / Self Care | Payer: Medicare Other | Source: Ambulatory Visit | Attending: Orthopedic Surgery | Admitting: Orthopedic Surgery

## 2021-01-01 ENCOUNTER — Ambulatory Visit (HOSPITAL_BASED_OUTPATIENT_CLINIC_OR_DEPARTMENT_OTHER): Payer: Medicare Other | Admitting: Anesthesiology

## 2021-01-01 ENCOUNTER — Encounter (HOSPITAL_BASED_OUTPATIENT_CLINIC_OR_DEPARTMENT_OTHER): Payer: Self-pay | Admitting: Orthopedic Surgery

## 2021-01-01 ENCOUNTER — Encounter (HOSPITAL_BASED_OUTPATIENT_CLINIC_OR_DEPARTMENT_OTHER)
Admission: RE | Disposition: A | Discharge status: Home / Self Care | Payer: Self-pay | Source: Ambulatory Visit | Attending: Orthopedic Surgery

## 2021-01-01 DIAGNOSIS — M069 Rheumatoid arthritis, unspecified: Secondary | ICD-10-CM | POA: Diagnosis not present

## 2021-01-01 DIAGNOSIS — Z87891 Personal history of nicotine dependence: Secondary | ICD-10-CM | POA: Diagnosis not present

## 2021-01-01 DIAGNOSIS — I10 Essential (primary) hypertension: Secondary | ICD-10-CM | POA: Insufficient documentation

## 2021-01-01 DIAGNOSIS — E669 Obesity, unspecified: Secondary | ICD-10-CM | POA: Diagnosis not present

## 2021-01-01 DIAGNOSIS — Z6837 Body mass index (BMI) 37.0-37.9, adult: Secondary | ICD-10-CM | POA: Diagnosis not present

## 2021-01-01 DIAGNOSIS — G5601 Carpal tunnel syndrome, right upper limb: Secondary | ICD-10-CM | POA: Insufficient documentation

## 2021-01-01 DIAGNOSIS — M797 Fibromyalgia: Secondary | ICD-10-CM | POA: Diagnosis not present

## 2021-01-01 DIAGNOSIS — K449 Diaphragmatic hernia without obstruction or gangrene: Secondary | ICD-10-CM | POA: Diagnosis not present

## 2021-01-01 DIAGNOSIS — E785 Hyperlipidemia, unspecified: Secondary | ICD-10-CM | POA: Diagnosis not present

## 2021-01-01 DIAGNOSIS — G5621 Lesion of ulnar nerve, right upper limb: Secondary | ICD-10-CM | POA: Insufficient documentation

## 2021-01-01 HISTORY — DX: Presence of spectacles and contact lenses: Z97.3

## 2021-01-01 HISTORY — PX: CARPAL TUNNEL RELEASE: SHX101

## 2021-01-01 HISTORY — PX: ULNAR TUNNEL RELEASE: SHX820

## 2021-01-01 HISTORY — DX: Presence of dental prosthetic device (complete) (partial): Z97.2

## 2021-01-01 HISTORY — DX: Headache, unspecified: R51.9

## 2021-01-01 LAB — POCT I-STAT, CHEM 8
BUN: 18 mg/dL (ref 8–23)
Calcium, Ion: 1.27 mmol/L (ref 1.15–1.40)
Chloride: 103 mmol/L (ref 98–111)
Creatinine, Ser: 0.5 mg/dL (ref 0.44–1.00)
Glucose, Bld: 80 mg/dL (ref 70–99)
HCT: 41 % (ref 36.0–46.0)
Hemoglobin: 13.9 g/dL (ref 12.0–15.0)
Potassium: 3.9 mmol/L (ref 3.5–5.1)
Sodium: 142 mmol/L (ref 135–145)
TCO2: 28 mmol/L (ref 22–32)

## 2021-01-01 SURGERY — RELEASE, CUBITAL TUNNEL
Anesthesia: Monitor Anesthesia Care | Site: Wrist | Laterality: Right

## 2021-01-01 MED ORDER — FENTANYL CITRATE (PF) 100 MCG/2ML IJ SOLN: 25.0000 ug | INTRAMUSCULAR | Status: DC | PRN

## 2021-01-01 MED ORDER — FENTANYL CITRATE (PF) 100 MCG/2ML IJ SOLN
INTRAMUSCULAR | Status: AC
  Filled 2021-01-01: qty 2

## 2021-01-01 MED ORDER — ONDANSETRON HCL 4 MG/2ML IJ SOLN
INTRAMUSCULAR | Status: DC | PRN
  Administered 2021-01-01: 4 mg via INTRAVENOUS

## 2021-01-01 MED ORDER — DEXAMETHASONE SODIUM PHOSPHATE 10 MG/ML IJ SOLN
INTRAMUSCULAR | Status: DC | PRN
  Administered 2021-01-01: 8 mg via INTRAVENOUS

## 2021-01-01 MED ORDER — LIDOCAINE HCL (PF) 1 % IJ SOLN
INTRAMUSCULAR | Status: DC | PRN
  Administered 2021-01-01: 20 mL

## 2021-01-01 MED ORDER — ACETAMINOPHEN 500 MG PO TABS
ORAL_TABLET | ORAL | Status: AC
  Filled 2021-01-01: qty 2

## 2021-01-01 MED ORDER — MIDAZOLAM HCL 2 MG/2ML IJ SOLN
INTRAMUSCULAR | Status: AC
  Filled 2021-01-01: qty 2

## 2021-01-01 MED ORDER — ACETAMINOPHEN 500 MG PO TABS
1000.0000 mg | ORAL_TABLET | Freq: Once | ORAL | Status: AC
  Administered 2021-01-01: 1000 mg via ORAL

## 2021-01-01 MED ORDER — PROPOFOL 10 MG/ML IV BOLUS
INTRAVENOUS | Status: DC | PRN
  Administered 2021-01-01: 20 mg via INTRAVENOUS
  Administered 2021-01-01: 40 mg via INTRAVENOUS
  Administered 2021-01-01 (×4): 20 mg via INTRAVENOUS

## 2021-01-01 MED ORDER — CLINDAMYCIN PHOSPHATE 900 MG/50ML IV SOLN
INTRAVENOUS | Status: AC
  Filled 2021-01-01: qty 50

## 2021-01-01 MED ORDER — ONDANSETRON HCL 4 MG/2ML IJ SOLN: 4.0000 mg | Freq: Once | INTRAMUSCULAR | Status: DC | PRN

## 2021-01-01 MED ORDER — ROPIVACAINE HCL 5 MG/ML IJ SOLN
INTRAMUSCULAR | Status: DC | PRN
  Administered 2021-01-01: 30 mL via PERINEURAL

## 2021-01-01 MED ORDER — CLONIDINE HCL (ANALGESIA) 100 MCG/ML EP SOLN
EPIDURAL | Status: DC | PRN
  Administered 2021-01-01: 50 ug

## 2021-01-01 MED ORDER — CLINDAMYCIN PHOSPHATE 600 MG/50ML IV SOLN
INTRAVENOUS | Status: AC
  Filled 2021-01-01: qty 50

## 2021-01-01 MED ORDER — 0.9 % SODIUM CHLORIDE (POUR BTL) OPTIME
TOPICAL | Status: DC | PRN
  Administered 2021-01-01: 500 mL

## 2021-01-01 MED ORDER — FENTANYL CITRATE (PF) 100 MCG/2ML IJ SOLN
INTRAMUSCULAR | Status: DC | PRN
  Administered 2021-01-01 (×2): 25 ug via INTRAVENOUS

## 2021-01-01 MED ORDER — MIDAZOLAM HCL 2 MG/2ML IJ SOLN
INTRAMUSCULAR | Status: DC | PRN
  Administered 2021-01-01: 1 mg via INTRAVENOUS

## 2021-01-01 MED ORDER — LIDOCAINE HCL (CARDIAC) PF 100 MG/5ML IV SOSY
PREFILLED_SYRINGE | INTRAVENOUS | Status: DC | PRN
  Administered 2021-01-01: 20 mg via INTRAVENOUS

## 2021-01-01 MED ORDER — PROPOFOL 500 MG/50ML IV EMUL
INTRAVENOUS | Status: DC | PRN
  Administered 2021-01-01: 75 ug/kg/min via INTRAVENOUS

## 2021-01-01 MED ORDER — CLINDAMYCIN PHOSPHATE 900 MG/50ML IV SOLN
900.0000 mg | INTRAVENOUS | Status: AC
  Administered 2021-01-01: 900 mg via INTRAVENOUS

## 2021-01-01 MED ORDER — LACTATED RINGERS IV SOLN: INTRAVENOUS | Status: DC

## 2021-01-01 MED ORDER — FENTANYL CITRATE (PF) 100 MCG/2ML IJ SOLN
100.0000 ug | Freq: Once | INTRAMUSCULAR | Status: AC
  Administered 2021-01-01: 100 ug via INTRAVENOUS

## 2021-01-01 MED ORDER — MIDAZOLAM HCL 2 MG/2ML IJ SOLN
2.0000 mg | Freq: Once | INTRAMUSCULAR | Status: AC
  Administered 2021-01-01: 2 mg via INTRAVENOUS

## 2021-01-01 SURGICAL SUPPLY — 73 items
BLADE SURG 15 STRL LF DISP TIS (BLADE) ×2 IMPLANT
BLADE SURG 15 STRL SS (BLADE) ×6
BNDG CMPR 9X4 STRL LF SNTH (GAUZE/BANDAGES/DRESSINGS) ×1
BNDG CONFORM 3 STRL LF (GAUZE/BANDAGES/DRESSINGS) ×3 IMPLANT
BNDG ELASTIC 4X5.8 VLCR STR LF (GAUZE/BANDAGES/DRESSINGS) ×3 IMPLANT
BNDG ELASTIC 6X5.8 VLCR STR LF (GAUZE/BANDAGES/DRESSINGS) ×3 IMPLANT
BNDG ESMARK 4X9 LF (GAUZE/BANDAGES/DRESSINGS) ×3 IMPLANT
BNDG GAUZE ELAST 4 BULKY (GAUZE/BANDAGES/DRESSINGS) ×3 IMPLANT
BRUSH SCRUB EZ PLAIN DRY (MISCELLANEOUS) ×3 IMPLANT
CLOSURE WOUND 1/2 X4 (GAUZE/BANDAGES/DRESSINGS)
CORD BIPOLAR FORCEPS 12FT (ELECTRODE) ×3 IMPLANT
COVER BACK TABLE 60X90IN (DRAPES) ×3 IMPLANT
CUFF TOURN SGL QUICK 18X4 (TOURNIQUET CUFF) IMPLANT
CUFF TOURN SGL QUICK 24 (TOURNIQUET CUFF) ×3
CUFF TRNQT CYL 24X4X16.5-23 (TOURNIQUET CUFF) ×1 IMPLANT
DECANTER SPIKE VIAL GLASS SM (MISCELLANEOUS) IMPLANT
DRAPE EXTREMITY T 121X128X90 (DISPOSABLE) ×3 IMPLANT
DRAPE INCISE IOBAN 66X45 STRL (DRAPES) IMPLANT
DRAPE SHEET LG 3/4 BI-LAMINATE (DRAPES) ×3 IMPLANT
DRAPE SURG 17X23 STRL (DRAPES) ×3 IMPLANT
DRAPE U-SHAPE 47X51 STRL (DRAPES) ×3 IMPLANT
GAUZE 4X4 16PLY ~~LOC~~+RFID DBL (SPONGE) ×3 IMPLANT
GAUZE SPONGE 4X4 12PLY STRL (GAUZE/BANDAGES/DRESSINGS) ×3 IMPLANT
GAUZE SPONGE 4X4 12PLY STRL LF (GAUZE/BANDAGES/DRESSINGS) ×3 IMPLANT
GAUZE XEROFORM 1X8 LF (GAUZE/BANDAGES/DRESSINGS) ×3 IMPLANT
GLOVE SURG ENC MOIS LTX SZ7.5 (GLOVE) ×3 IMPLANT
GLOVE SURG ENC MOIS LTX SZ8 (GLOVE) ×3 IMPLANT
GLOVE SURG ENC TEXT LTX SZ7.5 (GLOVE) IMPLANT
GLOVE SURG UNDER POLY LF SZ7.5 (GLOVE) ×3 IMPLANT
GOWN STRL REUS W/ TWL LRG LVL3 (GOWN DISPOSABLE) ×1 IMPLANT
GOWN STRL REUS W/TWL LRG LVL3 (GOWN DISPOSABLE) ×9 IMPLANT
HIBICLENS CHG 4% 4OZ (MISCELLANEOUS) ×3 IMPLANT
KIT TURNOVER CYSTO (KITS) ×3 IMPLANT
KNIFE CARPAL TUNNEL (BLADE) ×3 IMPLANT
LOOP VESSEL MAXI BLUE (MISCELLANEOUS) IMPLANT
NEEDLE HYPO 22GX1.5 SAFETY (NEEDLE) IMPLANT
NEEDLE HYPO 25X1 1.5 SAFETY (NEEDLE) ×3 IMPLANT
NS IRRIG 500ML POUR BTL (IV SOLUTION) ×3 IMPLANT
PACK BASIN DAY SURGERY FS (CUSTOM PROCEDURE TRAY) ×3 IMPLANT
PAD CAST 3X4 CTTN HI CHSV (CAST SUPPLIES) ×2 IMPLANT
PAD CAST 4YDX4 CTTN HI CHSV (CAST SUPPLIES) ×4 IMPLANT
PADDING CAST ABS 4INX4YD NS (CAST SUPPLIES) ×2
PADDING CAST ABS COTTON 4X4 ST (CAST SUPPLIES) ×1 IMPLANT
PADDING CAST COTTON 3X4 STRL (CAST SUPPLIES) ×6
PADDING CAST COTTON 4X4 STRL (CAST SUPPLIES) ×12
SPLINT FAST PLASTER 5X30 (CAST SUPPLIES)
SPLINT FIBERGLASS 3X35 (CAST SUPPLIES) ×3 IMPLANT
SPLINT PLASTER CAST FAST 5X30 (CAST SUPPLIES) IMPLANT
SPLINT PLASTER CAST XFAST 3X15 (CAST SUPPLIES) IMPLANT
SPLINT PLASTER XTRA FASTSET 3X (CAST SUPPLIES)
SPONGE T-LAP 4X18 ~~LOC~~+RFID (SPONGE) IMPLANT
STOCKINETTE 4X48 STRL (DRAPES) ×3 IMPLANT
STRIP CLOSURE SKIN 1/2X4 (GAUZE/BANDAGES/DRESSINGS) IMPLANT
SUCTION FRAZIER HANDLE 10FR (MISCELLANEOUS) ×3
SUCTION TUBE FRAZIER 10FR DISP (MISCELLANEOUS) ×1 IMPLANT
SUT BONE WAX W31G (SUTURE) IMPLANT
SUT ETHILON 4 0 PS 2 18 (SUTURE) ×6 IMPLANT
SUT FIBERWIRE 2-0 18 17.9 3/8 (SUTURE)
SUT PROLENE 4 0 PS 2 18 (SUTURE) IMPLANT
SUT VIC AB 3-0 FS2 27 (SUTURE) IMPLANT
SUT VIC AB 3-0 SH 27 (SUTURE) ×3
SUT VIC AB 3-0 SH 27XBRD (SUTURE) ×1 IMPLANT
SUTURE FIBERWR 2-0 18 17.9 3/8 (SUTURE) IMPLANT
SYR 10ML LL (SYRINGE) ×3 IMPLANT
SYR BULB EAR ULCER 3OZ GRN STR (SYRINGE) ×3 IMPLANT
SYR CONTROL 10ML LL (SYRINGE) ×3 IMPLANT
TAPE SURG TRANSPORE 1 IN (GAUZE/BANDAGES/DRESSINGS) ×1 IMPLANT
TAPE SURGICAL TRANSPORE 1 IN (GAUZE/BANDAGES/DRESSINGS) ×3
TOWEL OR 17X26 10 PK STRL BLUE (TOWEL DISPOSABLE) ×6 IMPLANT
TRAY DSU PREP LF (CUSTOM PROCEDURE TRAY) ×3 IMPLANT
TUBE CONNECTING 12'X1/4 (SUCTIONS) ×1
TUBE CONNECTING 12X1/4 (SUCTIONS) ×2 IMPLANT
UNDERPAD 30X36 HEAVY ABSORB (UNDERPADS AND DIAPERS) ×3 IMPLANT

## 2021-01-01 NOTE — Transfer of Care (Signed)
Immediate Anesthesia Transfer of Care Note  Patient: CARALYN TWINING  Procedure(s) Performed: CUBITAL TUNNEL RELEASE (Right: Wrist) CARPAL TUNNEL RELEASE (Right: Wrist)  Patient Location: PACU  Anesthesia Type:MAC and MAC combined with regional for post-op pain  Level of Consciousness: awake, alert , oriented and patient cooperative  Airway & Oxygen Therapy: Patient Spontanous Breathing  Post-op Assessment: Report given to RN and Post -op Vital signs reviewed and stable  Post vital signs: Reviewed and stable  Last Vitals:  Vitals Value Taken Time  BP 132/58 01/01/21 1617  Temp    Pulse 65 01/01/21 1619  Resp 13 01/01/21 1619  SpO2 94 % 01/01/21 1619  Vitals shown include unvalidated device data.  Last Pain:  Vitals:   01/01/21 1200  TempSrc:   PainSc: 0-No pain      Patients Stated Pain Goal: 4 (88/41/66 0630)  Complications: No notable events documented.

## 2021-01-01 NOTE — Discharge Instructions (Addendum)
Orthopaedic Hand Surgery Discharge Instructions  WEIGHT BEARING STATUS: Non weight bearing on operative extremity  DRESSINGS: Please keep your dressing/splint/cast clean and dry until your follow-up appointment. You may shower by placing a waterproof covering over your dressing/splint/cast. Contact your surgeon if your splint/cast gets wet. It will need to be changed to prevent skin breakdown.  PAIN CONTROL: First line medications for post operative pain control are Tylenol (acetaminophen) and Motrin (ibuprofen) if you are able to take these medications. If you have been prescribed a medication these can be taken as breakthrough pain medications. Please note that some narcotic pain medication have acetaminophen added and you should never consume more than 4,$RemoveBef'000mg'jIuhnuhLsl$  of acetaminophen in 24 hour period. Also please note that if you are given Toradol (ketoralac) you should not take similar medications simultaneously such as ibuprofen.   ICE/ELEVATION: Ice and elevate your injured extremity as needed. Avoid direct contact of ice with skin.  HOME MEDICATIONS: No changes have been made to your home medications.  FOLLOW UP: You will be called after surgery with an appointment date and time, however if you have not received a phone call within 3 days please call during regular office hours at [phone] to schedule a post operative appointment.  Please Seek Medical Attention if: Call MD for: pain or pressure in chest, jaw, arm, back, neck  Call MD for: temperature greater than 101 F for more than 24 hours  Call MD for: difficulty breathing Call MD for: Incision redness, bleeding, drainage  Call MD for: palpitations or feeling that the heart is racing  Call MD for: increased swelling in arm, leg, ankle, or abdomen  Call MD for: lightheadedness, dizziness, fainting Go to ED or call 911 if: chest pain does not go away after 3 nitroglycerin doses taken 5 min apart  Go to ED or call 911 for: any  uncontrolled bleeding  Go to ED or call 911 if: unable to reach physician  Discharge Medications: Allergies as of 01/01/2021       Reactions   Bee Venom Anaphylaxis   Keflex [cephalexin] Hives   Pt took po kelfex for sinus infection, developed blisters over entire body   Codeine Sulfate Other (See Comments)   GI upset   Penicillamine Other (See Comments)   Penicillins Rash        Medication List     TAKE these medications    B-D TB SYRINGE 1CC/27GX1/2" 27G X 1/2" 1 ML Misc Generic drug: TUBERCULIN SYR 1CC/27GX1/2"   BinaxNOW COVID-19 Ag Home Test Kit Generic drug: COVID-19 At Home Antigen Test Use as Directed on the Package   Biotin 10 MG Caps Take 10 mg by mouth daily.   Enbrel SureClick 50 MG/ML injection Generic drug: etanercept 50 mg once a week. Last taken 12/19/20. Patient states that surgeon told her to hold until after surgery on 01/01/21.   EPINEPHrine 0.3 mg/0.3 mL Soaj injection Commonly known as: EpiPen 2-Pak Inject 0.3 mLs (0.3 mg total) into the muscle once. What changed:  how much to take how to take this when to take this reasons to take this additional instructions   famotidine 40 MG tablet Commonly known as: PEPCID Take 40 mg by mouth at bedtime.   folic acid 751 MCG tablet Commonly known as: FOLVITE Take 1,600 mcg by mouth daily.   HYDROcodone-acetaminophen 10-325 MG tablet Commonly known as: NORCO 1 tablet 2 (two) times daily. As needed   methotrexate 50 MG/2ML injection Last dose 12/19/20 until after surgery (pt stated  she was told by her surgeon to stop until after surgery on 01/01/21)   ondansetron 4 MG tablet Commonly known as: Zofran Take 1 tablet (4 mg total) by mouth every 8 (eight) hours as needed for nausea or vomiting.   predniSONE 5 MG tablet Commonly known as: DELTASONE Take 5 mg by mouth daily with breakfast. 2 tabs every am 10 mg   rosuvastatin 20 MG tablet Commonly known as: CRESTOR Take 1 tablet (20 mg total)  by mouth daily.   sertraline 100 MG tablet Commonly known as: ZOLOFT Take 1 tablet (100 mg total) by mouth daily.   Vitamin B 12 100 MCG Lozg daily.   zolpidem 10 MG tablet Commonly known as: AMBIEN Take 1 tablet (10 mg total) by mouth at bedtime as needed. for sleep What changed: when to take this          J. Sable Feil, MD Orthopaedic Hand Surgeon [facility] Office number: [phone] [address] [place], [place] [address]     Post Anesthesia Home Care Instructions  Activity: Get plenty of rest for the remainder of the day. A responsible individual must stay with you for 24 hours following the procedure.  For the next 24 hours, DO NOT: -Drive a car -Paediatric nurse -Drink alcoholic beverages -Take any medication unless instructed by your physician -Make any legal decisions or sign important papers.  Meals: Start with liquid foods such as gelatin or soup. Progress to regular foods as tolerated. Avoid greasy, spicy, heavy foods. If nausea and/or vomiting occur, drink only clear liquids until the nausea and/or vomiting subsides. Call your physician if vomiting continues.  Special Instructions/Symptoms: Your throat may feel dry or sore from the anesthesia or the breathing tube placed in your throat during surgery. If this causes discomfort, gargle with warm salt water. The discomfort should disappear within 24 hours.  If you had a scopolamine patch placed behind your ear for the management of post- operative nausea and/or vomiting:  1. The medication in the patch is effective for 72 hours, after which it should be removed.  Wrap patch in a tissue and discard in the trash. Wash hands thoroughly with soap and water. 2. You may remove the patch earlier than 72 hours if you experience unpleasant side effects which may include dry mouth, dizziness or visual disturbances. 3. Avoid touching the patch. Wash your hands with soap and water after contact with  the patch.       Remove hand bandage on post op day 5. Leave your elbow dressing in place until you have your follow up appointment.

## 2021-01-01 NOTE — Op Note (Signed)
OPERATIVE NOTE  DATE OF PROCEDURE: 01/01/2021  SURGEONS:  Primary: [name], MD  PREOPERATIVE DIAGNOSIS: Right carpal and cubital tunnel syndrome  POSTOPERATIVE DIAGNOSIS: Same  NAME OF PROCEDURE:   Right carpal tunnel release Right cubital tunnel release, in situ decompression  ANESTHESIA: Regional  SKIN PREPARATION: Hibiclens  ESTIMATED BLOOD LOSS: Minimal  IMPLANTS: none  INDICATIONS:  [name] is a 65 y.o. female who has the above preoperative diagnosis. The patient has decided to proceed with surgical intervention.  Risks, benefits and alternatives of operative management were discussed including, but not limited to, risks of anesthesia complications, infection, pain, persistent symptoms, stiffness, need for future surgery.  The patient understands, agrees and elects to proceed with surgery.    DESCRIPTION OF PROCEDURE: The patient was met in the pre-operative area and their identity was verified.  The operative location and laterality was also verified and marked.  The patient was brought to the OR and was placed supine on the table.  After repeat patient identification with the operative team anesthesia was provided and the patient was prepped and draped in the usual sterile fashion.  A final timeout was performed verifying the correction patient, procedure, location and laterality.  Preoperative antibiotics were provided and the right upper extremity was elevated and exsanguinated with an Esmarch and the tourniquet was inflated to 250 mmHg.  A palmar incision was made approximately 1.5 cm in length and the skin and subcutaneous tissues were divided.  The palmar fascia was incised longitudinally and the transverse carpal ligament was identified.  This was then divided and a safeguard was placed over the median nerve and underneath the transverse carpal ligament and the carpal tunnel blade was utilized to release the proximal aspect of the transverse carpal ligament and the  distal forearm fascia.  The wound was thoroughly irrigated and the incision was closed with 4-0 nylon suture in horizontal mattress fashion.  a sterile soft bandage was applied.  At this time attention was turned to the right medial elbow.  A longitudinal incision was made between the olecranon process and the medial epicondyle located over the ulnar nerve.  The skin and subcutaneous tissues were divided and care taken to protect branches of the medial antebrachial cutaneous nerve.  The decompression of the ulnar nerve was performed both proximally and distally.  The elbow was brought through full range of motion and there is no subluxation of the nerve.  The skin was closed with combination of deep Vicryl interrupted sutures followed by 4-0 nylon suture in horizontal mattress fashion.  a bulky soft dressing was applied with the elbow in extension.  Tourniquet was deflated and the fingers were pink and warm well perfused at the end of the case.  All counts were correct x2.  The patient was awoken from anesthesia and brought to PACU for recovery in stable condition.   Felicia Holmes, MD

## 2021-01-01 NOTE — Progress Notes (Signed)
Assisted Dr. Turk with right, ultrasound guided, supraclavicular block. Side rails up, monitors on throughout procedure. See vital signs in flow sheet. Tolerated Procedure well. 

## 2021-01-01 NOTE — Anesthesia Preprocedure Evaluation (Signed)
Anesthesia Evaluation  Patient identified by MRN, date of birth, ID band Patient awake    Reviewed: Allergy & Precautions, NPO status , Patient's Chart, lab work & pertinent test results  History of Anesthesia Complications (+) PONV and history of anesthetic complications  Airway Mallampati: II  TM Distance: >3 FB Neck ROM: Full    Dental  (+) Dental Advisory Given, Partial Lower   Pulmonary neg pulmonary ROS, former smoker,    Pulmonary exam normal breath sounds clear to auscultation       Cardiovascular hypertension, + CAD  Normal cardiovascular exam Rhythm:Regular Rate:Normal     Neuro/Psych  Headaches, PSYCHIATRIC DISORDERS Anxiety Depression    GI/Hepatic Neg liver ROS, hiatal hernia, GERD  Medicated,  Endo/Other  Obesity   Renal/GU negative Renal ROS     Musculoskeletal  (+) Arthritis , Rheumatoid disorders,  Fibromyalgia -Right carpal and cubital tunnel syndrome   Abdominal   Peds  Hematology negative hematology ROS (+)   Anesthesia Other Findings Day of surgery medications reviewed with the patient.  Reproductive/Obstetrics                             Anesthesia Physical Anesthesia Plan  ASA: 3  Anesthesia Plan: Regional and MAC   Post-op Pain Management:  Regional for Post-op pain   Induction: Intravenous  PONV Risk Score and Plan: 3 and Propofol infusion, Midazolam, Dexamethasone and Ondansetron  Airway Management Planned: Nasal Cannula and Natural Airway  Additional Equipment:   Intra-op Plan:   Post-operative Plan:   Informed Consent: I have reviewed the patients History and Physical, chart, labs and discussed the procedure including the risks, benefits and alternatives for the proposed anesthesia with the patient or authorized representative who has indicated his/her understanding and acceptance.     Dental advisory given  Plan Discussed with:    Anesthesia Plan Comments:         Anesthesia Quick Evaluation

## 2021-01-01 NOTE — Interval H&P Note (Signed)
History and Physical Interval Note:  01/01/2021 3:13 PM  [name]  has presented today for surgery, with the diagnosis of Right carpal and cubital tunnel syndrome.  The various methods of treatment have been discussed with the patient and family. After consideration of risks, benefits and other options for treatment, the patient has consented to  Procedure(s) with comments: [place] (Right) - with MAC anesthesia needs 45 minutes CARPAL TUNNEL RELEASE, POSSIBLE TRANSPOSITION (Right) as a surgical intervention.  The patient's history has been reviewed, patient examined, no change in status, stable for surgery.  I have reviewed the patient's chart and labs.  Questions were answered to the patient's satisfaction.     [name]

## 2021-01-01 NOTE — Anesthesia Procedure Notes (Signed)
Anesthesia Regional Block: Supraclavicular block   Pre-Anesthetic Checklist: , timeout performed,  Correct Patient, Correct Site, Correct Laterality,  Correct Procedure, Correct Position, site marked,  Risks and benefits discussed,  Surgical consent,  Pre-op evaluation,  At surgeon's request and post-op pain management  Laterality: Right  Prep: chloraprep       Needles:  Injection technique: Single-shot  Needle Type: Echogenic Needle     Needle Length: 9cm  Needle Gauge: 21     Additional Needles:   Procedures:,,,, ultrasound used (permanent image in chart),,    Narrative:  Start time: 01/01/2021 11:38 AM End time: 01/01/2021 11:44 AM Injection made incrementally with aspirations every 5 mL.  Performed by: Personally  Anesthesiologist: Catalina Gravel, MD  Additional Notes: No pain on injection. No increased resistance to injection. Injection made in 5cc increments.  Good needle visualization.  Patient tolerated procedure well.

## 2021-01-02 ENCOUNTER — Encounter (HOSPITAL_BASED_OUTPATIENT_CLINIC_OR_DEPARTMENT_OTHER): Payer: Self-pay | Admitting: Orthopedic Surgery

## 2021-01-02 NOTE — Anesthesia Postprocedure Evaluation (Signed)
Anesthesia Post Note  Patient: [name]  Procedure(s) Performed: CUBITAL TUNNEL RELEASE (Right: Wrist) CARPAL TUNNEL RELEASE (Right: Wrist)     Patient location during evaluation: PACU Anesthesia Type: Regional Level of consciousness: awake and alert, awake and oriented Pain management: pain level controlled Vital Signs Assessment: post-procedure vital signs reviewed and stable Respiratory status: spontaneous breathing, nonlabored ventilation and respiratory function stable Cardiovascular status: stable and blood pressure returned to baseline Postop Assessment: no apparent nausea or vomiting Anesthetic complications: no   No notable events documented.  Last Vitals:  Vitals:   01/01/21 1645 01/01/21 1700  BP: 128/65 133/64  Pulse: (!) 57 (!) 55  Resp: 13 16  Temp:  36.6 C  SpO2: 96% 96%    Last Pain:  Vitals:   01/02/21 1055  TempSrc:   PainSc: 2                  Catalina Gravel

## 2021-01-05 ENCOUNTER — Encounter (HOSPITAL_BASED_OUTPATIENT_CLINIC_OR_DEPARTMENT_OTHER): Payer: Self-pay | Admitting: Orthopedic Surgery

## 2021-01-05 NOTE — Addendum Note (Signed)
Addendum  created 01/05/21 1041 by Georgeanne Nim, CRNA   Charge Capture section accepted

## 2021-01-13 DIAGNOSIS — M25631 Stiffness of right wrist, not elsewhere classified: Secondary | ICD-10-CM | POA: Diagnosis not present

## 2021-01-22 ENCOUNTER — Ambulatory Visit
Admission: RE | Admit: 2021-01-22 | Discharge: 2021-01-22 | Disposition: A | Discharge status: Home / Self Care | Payer: Medicare Other | Source: Ambulatory Visit | Attending: Family Medicine | Admitting: Family Medicine

## 2021-01-22 DIAGNOSIS — Z1231 Encounter for screening mammogram for malignant neoplasm of breast: Secondary | ICD-10-CM

## 2021-01-22 DIAGNOSIS — Z780 Asymptomatic menopausal state: Secondary | ICD-10-CM

## 2021-01-22 DIAGNOSIS — M8589 Other specified disorders of bone density and structure, multiple sites: Secondary | ICD-10-CM | POA: Diagnosis not present

## 2021-02-23 DIAGNOSIS — N390 Urinary tract infection, site not specified: Secondary | ICD-10-CM | POA: Diagnosis not present

## 2021-02-23 DIAGNOSIS — H65192 Other acute nonsuppurative otitis media, left ear: Secondary | ICD-10-CM | POA: Diagnosis not present

## 2021-02-23 DIAGNOSIS — J019 Acute sinusitis, unspecified: Secondary | ICD-10-CM | POA: Diagnosis not present

## 2021-03-03 ENCOUNTER — Ambulatory Visit (INDEPENDENT_AMBULATORY_CARE_PROVIDER_SITE_OTHER): Payer: Medicare Other | Admitting: Podiatry

## 2021-03-03 ENCOUNTER — Telehealth: Payer: Self-pay | Admitting: *Deleted

## 2021-03-03 ENCOUNTER — Other Ambulatory Visit: Payer: Self-pay

## 2021-03-03 ENCOUNTER — Ambulatory Visit (INDEPENDENT_AMBULATORY_CARE_PROVIDER_SITE_OTHER): Payer: Medicare Other

## 2021-03-03 DIAGNOSIS — M76829 Posterior tibial tendinitis, unspecified leg: Secondary | ICD-10-CM | POA: Diagnosis not present

## 2021-03-03 DIAGNOSIS — M25872 Other specified joint disorders, left ankle and foot: Secondary | ICD-10-CM | POA: Diagnosis not present

## 2021-03-03 DIAGNOSIS — M778 Other enthesopathies, not elsewhere classified: Secondary | ICD-10-CM | POA: Diagnosis not present

## 2021-03-03 NOTE — Telephone Encounter (Signed)
Called and asked if the prednisone can be increased to 15-20 mg daily for patient per Dr Jacqualyn Posey.  Elizabeth with Dr Barbera Setters office called and said that patient's prednisone can be increased for the pain /flare up in lt foot as long as there is a tapering in place for patient.

## 2021-03-04 ENCOUNTER — Ambulatory Visit (INDEPENDENT_AMBULATORY_CARE_PROVIDER_SITE_OTHER): Payer: Medicare Other | Admitting: Family Medicine

## 2021-03-04 VITALS — BP 140/70 | HR 67 | Temp 97.9°F | Wt 218.2 lb

## 2021-03-04 DIAGNOSIS — M8580 Other specified disorders of bone density and structure, unspecified site: Secondary | ICD-10-CM | POA: Diagnosis not present

## 2021-03-04 DIAGNOSIS — J019 Acute sinusitis, unspecified: Secondary | ICD-10-CM

## 2021-03-04 MED ORDER — LEVOFLOXACIN 500 MG PO TABS: 500.0000 mg | ORAL_TABLET | Freq: Every day | ORAL | 0 refills | Status: DC

## 2021-03-04 NOTE — Progress Notes (Signed)
Established Patient Office Visit  Subjective:  Patient ID: [name], female    DOB: 02/11/1956  Age: 66 y.o. MRN: [id number]  CC: No chief complaint on file.   HPI [name] presents for the following items  Recent DEXA scan.  Patient is on chronic low-dose prednisone secondary to rheumatoid arthritis.  DEXA scan showed T score -2.1 femur.  -1.9 spine.  She did have increased FRAX score with 10-year risk of hip fracture 3.4% and any major fracture 20.7%.  Her risk factors include chronic prednisone.  Also smoked years ago.  Positive family history of osteoporosis.  No recent fractures.  Other issue is persistent sinusitis symptoms.  She went to urgent care recently and treated with doxycycline.  She is allergic penicillin and Keflex.  She has some persistent greenish nasal discharge and frequent headaches and facial pain.  She had complicated sinusitis in the past with sphenoid sinusitis requiring surgery.  This was years ago.  She has tolerated Levaquin in the past.  Past Medical History:  Diagnosis Date   ANGIOMA 10/03/2008   REMOVED FROM RIGHT LOWER LEG-BENIGN   Anxiety    Aortic atherosclerosis (HCC)    Arthritis    RA   Bronchitis    hx of    Cancer (HCC)    BASAL CELL SKIN CANCER   Complication of anesthesia    Coronary artery disease    COVID-19 06/18/2020   HA, chills, myalgias, congestion & fever, all symptoms resolved as of 12/29/20 per patient   Depression    Fibromyalgia    GERD 08/27/2008   History of blood transfusion    1995   History of hiatal hernia    hx of has had surgically repaired   History of measles    History of mumps    History of nonmelanoma skin cancer    HYPERLIPIDEMIA 08/27/2008   HYPERTENSION 08/27/2008   pt is currently on no meds    Pneumonia    hx of    PONV (postoperative nausea and vomiting)    Rheumatoid arthritis ([place])    SEBORRHEIC KERATOSIS 08/27/2008   Sinus headache    Vocal cord paresis    hx of    Wears  glasses    Wears partial dentures     Past Surgical History:  Procedure Laterality Date   ABDOMINAL HYSTERECTOMY  1995   APPENDECTOMY     bilateral shoulder replacements      BREAST BIOPSY Left    BREAST DUCTAL SYSTEM EXCISION  08/16/2011   Procedure: EXCISION DUCTAL SYSTEM BREAST;  Surgeon: Marcello Moores A. Cornett, MD;  Location: [place];  Service: General;  Laterality: Left;  left breast duct excision   CARPAL TUNNEL RELEASE Right 01/01/2021   Procedure: CARPAL TUNNEL RELEASE;  Surgeon: [name], MD;  Location: [place];  Service: Orthopedics;  Laterality: Right;   CESAREAN SECTION  1980   times 2; 1980 and [place]   COLONOSCOPY  09/15/2018   EXCISION/RELEASE BURSA HIP Right 10/21/2014   Procedure: OPEN RIGHT HIP BURSECTOMY, EXOSECTOMY;  Surgeon: Paralee Cancel, MD;  Location: [facility];  Service: Orthopedics;  Laterality: Right;   foot surgery  2004, 2005, 2008   bilat secondary to neuropathy    JOINT REPLACEMENT  11/2010   lt total knee X2   KNEE ARTHROSCOPY  2007, 2008, 2009   LAPAROSCOPIC GASTRIC SLEEVE RESECTION N/A 10/30/2013   Procedure:  LAPAROSCOPIC GASTRIC SLEEVE RESECTION WITH HIATAL HERNIA REPAIR AND UPPER ENDOSCOPY;  Surgeon: Greer Pickerel, MD;  Location: [facility];  Service: General;  Laterality: N/A;   NASAL SINUS SURGERY  2013   OPEN SURGICAL REPAIR OF GLUTEAL TENDON  01/17/2012   Procedure: OPEN SURGICAL REPAIR OF GLUTEAL TENDON;  Surgeon: Mauri Pole, MD;  Location: [facility];  Service: Orthopedics;  Laterality: Right;   TOTAL KNEE ARTHROPLASTY Right 04/15/2020   Procedure: TOTAL KNEE ARTHROPLASTY;  Surgeon: Paralee Cancel, MD;  Location: [facility];  Service: Orthopedics;  Laterality: Right;  70 mins   ULNAR TUNNEL RELEASE Right 01/01/2021   Procedure: CUBITAL TUNNEL RELEASE;  Surgeon: [name], MD;  Location: [place];  Service: Orthopedics;  Laterality: Right;  with MAC  anesthesia needs 45 minutes    Family History  Problem Relation Age of Onset   Cancer Mother        breast   Breast cancer Mother 78   Kidney disease Father    Epilepsy Father    Heart attack Sister    Heart attack Sister    Diabetes Son     Social History   Socioeconomic History   Marital status: Married    Spouse name: Not on file   Number of children: 2   Years of education: Not on file   Highest education level: 12th grade  Occupational History   Occupation: retired    Comment: Building services engineer  Tobacco Use   Smoking status: Former    Packs/day: 0.25    Years: 4.00    Pack years: 1.00    Types: Cigarettes    Quit date: 02/23/1984    Years since quitting: 37.0   Smokeless tobacco: Never  Vaping Use   Vaping Use: Never used  Substance and Sexual Activity   Alcohol use: No   Drug use: No   Sexual activity: Not on file  Other Topics Concern   Not on file  Social History Narrative   Lives at home with husband   Disabled   Right handed   Caffeine: 1 cup of coffee daily   Social Determinants of Health   Financial Resource Strain: Low Risk    Difficulty of Paying Living Expenses: Not hard at all  Food Insecurity: No Food Insecurity   Worried About Charity fundraiser in the Last Year: Never true   [place] in the Last Year: Never true  Transportation Needs: No Transportation Needs   Lack of Transportation (Medical): No   Lack of Transportation (Non-Medical): No  Physical Activity: Unknown   Days of Exercise per Week: Patient refused   Minutes of Exercise per Session: 0 min  Stress: No Stress Concern Present   Feeling of Stress : Not at all  Social Connections: Socially Integrated   Frequency of Communication with Friends and Family: More than three times a week   Frequency of Social Gatherings with Friends and Family: More than three times a week   Attends Religious Services: More than 4 times per year   Active Member of [employer] or Organizations:  Yes   Attends Music therapist: More than 4 times per year   Marital Status: Married  Human resources officer Violence: Not At Risk   Fear of Current or Ex-Partner: No   Emotionally Abused: No   Physically Abused: No   Sexually Abused: No    Outpatient Medications Prior to Visit  Medication Sig Dispense Refill   B-D TB SYRINGE  1CC/27GX1/2" 27G X 1/2" 1 ML MISC      BINAXNOW COVID-19 AG HOME TEST KIT Use as Directed on the Package     Biotin 10 MG CAPS Take 10 mg by mouth daily.     Cyanocobalamin (VITAMIN B 12) 100 MCG LOZG daily.     EPINEPHrine (EPIPEN 2-PAK) 0.3 mg/0.3 mL IJ SOAJ injection Inject 0.3 mLs (0.3 mg total) into the muscle once. (Patient taking differently: Inject 0.3 mg into the muscle as needed for anaphylaxis.) 2 each 1   etanercept (ENBREL SURECLICK) 50 MG/ML injection 50 mg once a week. Last taken 12/19/20. Patient states that surgeon told her to hold until after surgery on 01/01/21.     famotidine (PEPCID) 40 MG tablet Take 40 mg by mouth at bedtime.     folic acid (FOLVITE) 222 MCG tablet Take 1,600 mcg by mouth daily.     HYDROcodone-acetaminophen (NORCO) 10-325 MG tablet 1 tablet 2 (two) times daily. As needed     methotrexate 50 MG/2ML injection Last dose 12/19/20 until after surgery (pt stated she was told by her surgeon to stop until after surgery on 01/01/21)     ondansetron (ZOFRAN) 4 MG tablet Take 1 tablet (4 mg total) by mouth every 8 (eight) hours as needed for nausea or vomiting. 20 tablet 1   predniSONE (DELTASONE) 5 MG tablet Take 5 mg by mouth daily with breakfast. 2 tabs every am 10 mg     rosuvastatin (CRESTOR) 20 MG tablet Take 1 tablet (20 mg total) by mouth daily. 90 tablet 3   sertraline (ZOLOFT) 100 MG tablet Take 1 tablet (100 mg total) by mouth daily. 30 tablet 3   zolpidem (AMBIEN) 10 MG tablet Take 1 tablet (10 mg total) by mouth at bedtime as needed. for sleep (Patient taking differently: Take 10 mg by mouth at bedtime. for sleep) 90  tablet 1   No facility-administered medications prior to visit.    Allergies  Allergen Reactions   Bee Venom Anaphylaxis   Keflex [Cephalexin] Hives    Pt took po kelfex for sinus infection, developed blisters over entire body   Codeine Sulfate Other (See Comments)    GI upset   Penicillamine Other (See Comments)   Penicillins Rash    ROS Review of Systems  Constitutional:  Negative for chills and fever.  HENT:  Positive for sinus pressure and sinus pain.      Objective:    Physical Exam Constitutional:      Appearance: She is well-developed.  HENT:     Right Ear: Tympanic membrane normal.     Left Ear: Tympanic membrane normal.     Nose: Nose normal.  Eyes:     Pupils: Pupils are equal, round, and reactive to light.  Neck:     Thyroid: No thyromegaly.     Vascular: No JVD.  Cardiovascular:     Rate and Rhythm: Normal rate and regular rhythm.     Heart sounds:    No gallop.  Pulmonary:     Effort: Pulmonary effort is normal. No respiratory distress.     Breath sounds: Normal breath sounds. No wheezing or rales.  Musculoskeletal:     Cervical back: Neck supple.  Neurological:     Mental Status: She is alert.    BP 140/70 (BP Location: Left Arm, Patient Position: Sitting, Cuff Size: Normal)    Pulse 67    Temp 97.9 F (36.6 C) (Oral)    Wt 218 lb 3.2 oz (  99 kg)    SpO2 98%    BMI 37.45 kg/m  Wt Readings from Last 3 Encounters:  03/04/21 218 lb 3.2 oz (99 kg)  01/01/21 220 lb 6.4 oz (100 kg)  07/30/20 215 lb 9.6 oz (97.8 kg)     Health Maintenance Due  Topic Date Due   Pneumonia Vaccine 90+ Years old (3 - PCV) 12/01/2013   Zoster Vaccines- Shingrix (2 of 2) 01/08/2019   TETANUS/TDAP  08/29/2019   COVID-19 Vaccine (4 - Booster for Moderna series) 01/18/2020    There are no preventive care reminders to display for this patient.  Lab Results  Component Value Date   TSH 2.30 12/05/2019   Lab Results  Component Value Date   WBC 7.3 06/02/2020   HGB  13.9 01/01/2021   HCT 41.0 01/01/2021   MCV 92.0 06/02/2020   PLT 239.0 06/02/2020   Lab Results  Component Value Date   NA 142 01/01/2021   K 3.9 01/01/2021   CO2 22 04/17/2020   GLUCOSE 80 01/01/2021   BUN 18 01/01/2021   CREATININE 0.50 01/01/2021   BILITOT 0.6 06/02/2020   ALKPHOS 77 06/02/2020   AST 26 06/02/2020   ALT 21 06/02/2020   PROT 6.5 06/02/2020   ALBUMIN 4.2 06/02/2020   CALCIUM 8.8 (L) 04/17/2020   ANIONGAP 11 04/17/2020   GFR 122.98 09/23/2014   Lab Results  Component Value Date   CHOL 169 06/02/2020   Lab Results  Component Value Date   HDL 105.10 06/02/2020   Lab Results  Component Value Date   LDLCALC 42 06/02/2020   Lab Results  Component Value Date   TRIG 112.0 06/02/2020   Lab Results  Component Value Date   CHOLHDL 2 06/02/2020   No results found for: HGBA1C    Assessment & Plan:   #1 osteopenia with increased FRAX score of 3.4% and 20.7%.  We did discuss medical therapies but she declines any at this time.  She wants to focus on lifestyle management with increased weightbearing exercise and calcium and vitamin D and consider repeat in 1 to 2 years.  #2 persistent sinusitis symptoms.  Not improved with doxycycline.  Cannot take penicillin or cephalosporins.  We decided to add Levaquin 500 mg daily for anaerobic coverage.  We did discuss other options such as clindamycin but also increased risk of C. difficile colitis with that.  She is aware of potential risk with quinolones.  Follow-up for any persistent symptoms  We spent approximate 30 minutes reviewing her DEXA scan and reviewing potential treatment options for osteoporosis and reviewing her sinusitis issues.   Meds ordered this encounter  Medications   levofloxacin (LEVAQUIN) 500 MG tablet    Sig: Take 1 tablet (500 mg total) by mouth daily.    Dispense:  10 tablet    Refill:  0    Follow-up: No follow-ups on file.    Carolann Littler, MD

## 2021-03-04 NOTE — Patient Instructions (Signed)
We need to repeat DEXA in a couple of years.

## 2021-03-08 NOTE — Progress Notes (Signed)
Subjective: 66 year old female presents the office today for concerns of left diffuse foot and ankle pain.  She has pain to her foot as well as ankle and she has not had any recent injury or trauma.  She is seen some swelling.  Is difficult to walk given the discomfort.  She denies any recent treatment since last saw her.  She has no other concerns today.  Objective: AAO x3, NAD DP/PT pulses palpable bilaterally, CRT less than 3 seconds LEFT foot: Incisions well coapted without any evidence of dehiscence.  There is global tenderness present.  There is no specific area of pinpoint tenderness.  The tenderness appears to be on the medial aspect of the foot along the first MPJ area.  Arthrodesis site appears to be stable.  Also discomfort noted along the fifth metatarsal head area plantarly.  Discomfort on the course of the posterior tibial, flexor tendons.  There is mild edema.  There is no erythema or warmth.  Ankle, subtalar joint range of motion intact.  MMT 5/5.  Arthrodesis site is stable. No pain with calf compression, swelling, warmth, erythema  Assessment: Capsulitis, tendinitis  Plan: -All treatment options discussed with the patient including all alternatives, risks, complications.  -X-rays obtained reviewed there is no evidence of acute fracture or stress fracture.  Hardware intact status post first metatarsal joint arthrodesis.  There is some bone growth noted along the fifth metatarsal head resection site which likely is causing her discomfort as well. -At this point given her pain recommend immobilization in cam boot which she already has in the car.  Possibly contact her rheumatologist for increasing dose of steroids for short-term.  As she starts to feel better she can transition to regular shoe as tolerated.  I am also going to have her follow-up with our orthotist to make an accommodative lightweight orthotic.  She has not been able to tolerate a regular orthotic but I do think that  offloading will be helpful, particularly on the fifth metatarsal head.  Trula Slade DPM

## 2021-03-17 ENCOUNTER — Other Ambulatory Visit: Payer: Medicare Other

## 2021-03-17 DIAGNOSIS — M0589 Other rheumatoid arthritis with rheumatoid factor of multiple sites: Secondary | ICD-10-CM | POA: Diagnosis not present

## 2021-04-07 DIAGNOSIS — L298 Other pruritus: Secondary | ICD-10-CM | POA: Diagnosis not present

## 2021-04-07 DIAGNOSIS — L438 Other lichen planus: Secondary | ICD-10-CM | POA: Diagnosis not present

## 2021-04-07 DIAGNOSIS — D2261 Melanocytic nevi of right upper limb, including shoulder: Secondary | ICD-10-CM | POA: Diagnosis not present

## 2021-04-07 DIAGNOSIS — D225 Melanocytic nevi of trunk: Secondary | ICD-10-CM | POA: Diagnosis not present

## 2021-04-07 DIAGNOSIS — D2372 Other benign neoplasm of skin of left lower limb, including hip: Secondary | ICD-10-CM | POA: Diagnosis not present

## 2021-04-07 DIAGNOSIS — C44519 Basal cell carcinoma of skin of other part of trunk: Secondary | ICD-10-CM | POA: Diagnosis not present

## 2021-04-07 DIAGNOSIS — L570 Actinic keratosis: Secondary | ICD-10-CM | POA: Diagnosis not present

## 2021-04-07 DIAGNOSIS — Z85828 Personal history of other malignant neoplasm of skin: Secondary | ICD-10-CM | POA: Diagnosis not present

## 2021-04-08 DIAGNOSIS — M7061 Trochanteric bursitis, right hip: Secondary | ICD-10-CM | POA: Diagnosis not present

## 2021-04-08 DIAGNOSIS — M7062 Trochanteric bursitis, left hip: Secondary | ICD-10-CM | POA: Diagnosis not present

## 2021-04-08 DIAGNOSIS — Z96651 Presence of right artificial knee joint: Secondary | ICD-10-CM | POA: Diagnosis not present

## 2021-04-14 DIAGNOSIS — M0589 Other rheumatoid arthritis with rheumatoid factor of multiple sites: Secondary | ICD-10-CM | POA: Diagnosis not present

## 2021-04-14 DIAGNOSIS — Z79899 Other long term (current) drug therapy: Secondary | ICD-10-CM | POA: Diagnosis not present

## 2021-04-30 DIAGNOSIS — M0589 Other rheumatoid arthritis with rheumatoid factor of multiple sites: Secondary | ICD-10-CM | POA: Diagnosis not present

## 2021-04-30 DIAGNOSIS — M1991 Primary osteoarthritis, unspecified site: Secondary | ICD-10-CM | POA: Diagnosis not present

## 2021-04-30 DIAGNOSIS — E669 Obesity, unspecified: Secondary | ICD-10-CM | POA: Diagnosis not present

## 2021-04-30 DIAGNOSIS — M797 Fibromyalgia: Secondary | ICD-10-CM | POA: Diagnosis not present

## 2021-04-30 DIAGNOSIS — G5603 Carpal tunnel syndrome, bilateral upper limbs: Secondary | ICD-10-CM | POA: Diagnosis not present

## 2021-04-30 DIAGNOSIS — Z6837 Body mass index (BMI) 37.0-37.9, adult: Secondary | ICD-10-CM | POA: Diagnosis not present

## 2021-05-05 ENCOUNTER — Ambulatory Visit (INDEPENDENT_AMBULATORY_CARE_PROVIDER_SITE_OTHER): Payer: Medicare Other | Admitting: Family Medicine

## 2021-05-05 ENCOUNTER — Other Ambulatory Visit: Payer: Self-pay

## 2021-05-05 ENCOUNTER — Encounter: Payer: Self-pay | Admitting: Family Medicine

## 2021-05-05 VITALS — BP 146/90 | HR 63 | Temp 98.1°F | Ht 64.0 in | Wt 220.9 lb

## 2021-05-05 DIAGNOSIS — I10 Essential (primary) hypertension: Secondary | ICD-10-CM | POA: Diagnosis not present

## 2021-05-05 DIAGNOSIS — R42 Dizziness and giddiness: Secondary | ICD-10-CM | POA: Diagnosis not present

## 2021-05-05 MED ORDER — LISINOPRIL 10 MG PO TABS: 10.0000 mg | ORAL_TABLET | Freq: Every day | ORAL | 3 refills | Status: DC

## 2021-05-05 NOTE — Progress Notes (Signed)
Established Patient Office Visit  Subjective:  Patient ID: [name], female    DOB: 11-10-1955  Age: 66 y.o. MRN: [id number]  CC:  Chief Complaint  Patient presents with   Dizziness    HPI [name] presents with 49-month history of intermittent dizziness.  She describes this as a spinning sensation.  Does have associated nausea and occasional vomiting.  No headaches.  She does describe episodes past Friday when she was driving up to [place] and states that she felt "hot all over ".  This was followed by some blurring of vision right eye which is transient.  No focal weakness.  No speech changes.  No swallowing difficulties.  Her vertigo symptoms have been intermittent for 2 months.  Usually last for 2 or 3 days and eventually improved.  Frequently noted with things like rolling over in the bed.  She has not noted directional component.  No focal weakness.  No ataxia.  No hearing changes.  No tinnitus.  No diplopia.  She has history of hypertension.  She apparently was taken off of lisinopril in the past per cardiology but she does not recall any hypotension episodes.  She has had consistently elevated blood pressures recently up frequently in the 150s systolic.  Past Medical History:  Diagnosis Date   ANGIOMA 10/03/2008   REMOVED FROM RIGHT LOWER LEG-BENIGN   Anxiety    Aortic atherosclerosis (HCC)    Arthritis    RA   Bronchitis    hx of    Cancer (HCC)    BASAL CELL SKIN CANCER   Complication of anesthesia    Coronary artery disease    COVID-19 06/18/2020   HA, chills, myalgias, congestion & fever, all symptoms resolved as of 12/29/20 per patient   Depression    Fibromyalgia    GERD 08/27/2008   History of blood transfusion    1995   History of hiatal hernia    hx of has had surgically repaired   History of measles    History of mumps    History of nonmelanoma skin cancer    HYPERLIPIDEMIA 08/27/2008   HYPERTENSION 08/27/2008   pt is currently on no  meds    Pneumonia    hx of    PONV (postoperative nausea and vomiting)    Rheumatoid arthritis (HCC)    SEBORRHEIC KERATOSIS 08/27/2008   Sinus headache    Vocal cord paresis    hx of    Wears glasses    Wears partial dentures     Past Surgical History:  Procedure Laterality Date   ABDOMINAL HYSTERECTOMY  1995   APPENDECTOMY     bilateral shoulder replacements      BREAST BIOPSY Left    BREAST DUCTAL SYSTEM EXCISION  08/16/2011   Procedure: EXCISION DUCTAL SYSTEM BREAST;  Surgeon: Maisie Fus A. Cornett, MD;  Location: [facility];  Service: General;  Laterality: Left;  left breast duct excision   CARPAL TUNNEL RELEASE Right 01/01/2021   Procedure: CARPAL TUNNEL RELEASE;  Surgeon: Gomez Cleverly, MD;  Location[facility];  Service: Orthopedics;  Laterality: Right;   CESAREAN SECTION  1980   times 2; 1980 and 1982   CESAREAN SECTION  1982   CHOLECYSTECTOMY  1997   COLONOSCOPY  09/15/2018   EXCISION/RELEASE BURSA HIP Right 10/21/2014   Procedure: OPEN RIGHT HIP BURSECTOMY, EXOSECTOMY;  Surgeon: Durene Romans, MD;  Location: [facility];  Service: Orthopedics;  Laterality: Right;   foot surgery  2004, 2005, 2008   bilat secondary to neuropathy    JOINT REPLACEMENT  11/2010   lt total knee X2   KNEE ARTHROSCOPY  2007, 2008, 2009   LAPAROSCOPIC GASTRIC SLEEVE RESECTION N/A 10/30/2013   Procedure: LAPAROSCOPIC GASTRIC SLEEVE RESECTION WITH HIATAL HERNIA REPAIR AND UPPER ENDOSCOPY;  Surgeon: Gaynelle Adu, MD;  [facility];  Service: General;  Laterality: N/A;   NASAL SINUS SURGERY  2013   OPEN SURGICAL REPAIR OF GLUTEAL TENDON  01/17/2012   Procedure: OPEN SURGICAL REPAIR OF GLUTEAL TENDON;  Surgeon: Shelda Pal, MD;  [facility];  Service: Orthopedics;  Laterality: Right;   TOTAL KNEE ARTHROPLASTY Right 04/15/2020   Procedure: TOTAL KNEE ARTHROPLASTY;  Surgeon: Durene Romans, MD;  [facility];  Service: Orthopedics;  Laterality: Right;  70 mins    ULNAR TUNNEL RELEASE Right 01/01/2021   Procedure: CUBITAL TUNNEL RELEASE;  Surgeon: Gomez Cleverly, MD;  Location: [facility];  Service: Orthopedics;  Laterality: Right;  with MAC anesthesia needs 45 minutes    Family History  Problem Relation Age of Onset   Cancer Mother        breast   Breast cancer Mother 19   Kidney disease Father    Epilepsy Father    Heart attack Sister    Heart attack Sister    Diabetes Son     Social History   Socioeconomic History   Marital status: Married    Spouse name: Not on file   Number of children: 2   Years of education: Not on file   Highest education level: 12th grade  Occupational History   Occupation: retired    Comment: Arts administrator  Tobacco Use   Smoking status: Former    Packs/day: 0.25    Years: 4.00    Pack years: 1.00    Types: Cigarettes    Quit date: 02/23/1984    Years since quitting: 37.2   Smokeless tobacco: Never  Vaping Use   Vaping Use: Never used  Substance and Sexual Activity   Alcohol use: No   Drug use: No   Sexual activity: Not on file  Other Topics Concern   Not on file  Social History Narrative   Lives at home with husband   Disabled   Right handed   Caffeine: 1 cup of coffee daily   Social Determinants of Health   Financial Resource Strain: Low Risk    Difficulty of Paying Living Expenses: Not hard at all  Food Insecurity: No Food Insecurity   Worried About Programme researcher, broadcasting/film/video in the Last Year: Never true   Ran Out of Food in the Last Year: Never true  Transportation Needs: No Transportation Needs   Lack of Transportation (Medical): No   Lack of Transportation (Non-Medical): No  Physical Activity: Unknown   Days of Exercise per Week: Patient refused   Minutes of Exercise per Session: 0 min  Stress: No Stress Concern Present   Feeling of Stress : Not at all  Social Connections: Socially Integrated   Frequency of Communication with Friends and Family: More than three  times a week   Frequency of Social Gatherings with Friends and Family: More than three times a week   Attends Religious Services: More than 4 times per year   Active Member of [employer] or Organizations: Yes   Attends Engineer, structural: More than 4 times per year   Marital Status: Married  Catering manager Violence: Not At Risk  Fear of Current or Ex-Partner: No   Emotionally Abused: No   Physically Abused: No   Sexually Abused: No    Outpatient Medications Prior to Visit  Medication Sig Dispense Refill   B-D TB SYRINGE 1CC/27GX1/2" 27G X 1/2" 1 ML MISC      Biotin 10 MG CAPS Take 10 mg by mouth daily.     EPINEPHrine (EPIPEN 2-PAK) 0.3 mg/0.3 mL IJ SOAJ injection Inject 0.3 mLs (0.3 mg total) into the muscle once. (Patient taking differently: Inject 0.3 mg into the muscle as needed for anaphylaxis.) 2 each 1   famotidine (PEPCID) 40 MG tablet Take 40 mg by mouth at bedtime.     folic acid (FOLVITE) 800 MCG tablet Take 1,600 mcg by mouth daily.     Golimumab (SIMPONI Wister) Inject into the skin.     methotrexate 50 MG/2ML injection Last dose 12/19/20 until after surgery (pt stated she was told by her surgeon to stop until after surgery on 01/01/21)     predniSONE (DELTASONE) 5 MG tablet Take 5 mg by mouth daily with breakfast. 2 tabs every am 10 mg     rosuvastatin (CRESTOR) 20 MG tablet Take 1 tablet (20 mg total) by mouth daily. 90 tablet 3   sertraline (ZOLOFT) 100 MG tablet Take 1 tablet (100 mg total) by mouth daily. 30 tablet 3   zolpidem (AMBIEN) 10 MG tablet Take 1 tablet (10 mg total) by mouth at bedtime as needed. for sleep (Patient taking differently: Take 10 mg by mouth at bedtime. for sleep) 90 tablet 1   BINAXNOW COVID-19 AG HOME TEST KIT Use as Directed on the Package     HYDROcodone-acetaminophen (NORCO) 10-325 MG tablet 1 tablet 2 (two) times daily. As needed     Cyanocobalamin (VITAMIN B 12) 100 MCG LOZG daily.     etanercept (ENBREL SURECLICK) 50 MG/ML injection  50 mg once a week. Last taken 12/19/20. Patient states that surgeon told her to hold until after surgery on 01/01/21.     levofloxacin (LEVAQUIN) 500 MG tablet Take 1 tablet (500 mg total) by mouth daily. 10 tablet 0   No facility-administered medications prior to visit.    Allergies  Allergen Reactions   Bee Venom Anaphylaxis   Keflex [Cephalexin] Hives    Pt took po kelfex for sinus infection, developed blisters over entire body   Codeine Sulfate Other (See Comments)    GI upset   Penicillamine Other (See Comments)   Penicillins Rash    ROS Review of Systems  Constitutional:  Negative for fatigue, fever and unexpected weight change.  Eyes:  Negative for visual disturbance.  Respiratory:  Negative for cough, chest tightness, shortness of breath and wheezing.   Cardiovascular:  Negative for chest pain, palpitations and leg swelling.  Neurological:  Positive for dizziness. Negative for seizures, syncope, facial asymmetry, weakness, light-headedness and headaches.       See HPI     Objective:    Physical Exam Constitutional:      Appearance: She is well-developed.  Eyes:     Pupils: Pupils are equal, round, and reactive to light.  Neck:     Thyroid: No thyromegaly.     Vascular: No JVD.     Comments: No carotid bruit Cardiovascular:     Rate and Rhythm: Normal rate and regular rhythm.     Heart sounds:    No gallop.  Pulmonary:     Effort: Pulmonary effort is normal. No respiratory distress.  Breath sounds: Normal breath sounds. No wheezing or rales.  Musculoskeletal:     Cervical back: Neck supple.  Neurological:     General: No focal deficit present.     Mental Status: She is alert and oriented to person, place, and time.     Cranial Nerves: No cranial nerve deficit.     Motor: No weakness.     Coordination: Coordination normal.     Comments: She has vertigo symptoms that are reproduced with head turn 45 degrees to the right and going from seated to supine but  not with head turn to the left    BP (!) 146/90 (BP Location: Left Arm, Cuff Size: Large)   Pulse 63   Temp 98.1 F (36.7 C) (Oral)   Ht 5\' 4"  (1.626 m)   Wt 220 lb 14.4 oz (100.2 kg)   SpO2 98%   BMI 37.92 kg/m  Wt Readings from Last 3 Encounters:  05/05/21 220 lb 14.4 oz (100.2 kg)  03/04/21 218 lb 3.2 oz (99 kg)  01/01/21 220 lb 6.4 oz (100 kg)     Health Maintenance Due  Topic Date Due   Pneumonia Vaccine 37+ Years old (2 - PCV) 12/01/2013   Zoster Vaccines- Shingrix (2 of 2) 01/08/2019   TETANUS/TDAP  08/29/2019   COVID-19 Vaccine (4 - Booster for Moderna series) 01/18/2020    There are no preventive care reminders to display for this patient.  Lab Results  Component Value Date   TSH 2.30 12/05/2019   Lab Results  Component Value Date   WBC 7.3 06/02/2020   HGB 13.9 01/01/2021   HCT 41.0 01/01/2021   MCV 92.0 06/02/2020   PLT 239.0 06/02/2020   Lab Results  Component Value Date   NA 142 01/01/2021   K 3.9 01/01/2021   CO2 22 04/17/2020   GLUCOSE 80 01/01/2021   BUN 18 01/01/2021   CREATININE 0.50 01/01/2021   BILITOT 0.6 06/02/2020   ALKPHOS 77 06/02/2020   AST 26 06/02/2020   ALT 21 06/02/2020   PROT 6.5 06/02/2020   ALBUMIN 4.2 06/02/2020   CALCIUM 8.8 (L) 04/17/2020   ANIONGAP 11 04/17/2020   GFR 122.98 09/23/2014   Lab Results  Component Value Date   CHOL 169 06/02/2020   Lab Results  Component Value Date   HDL 105.10 06/02/2020   Lab Results  Component Value Date   LDLCALC 42 06/02/2020   Lab Results  Component Value Date   TRIG 112.0 06/02/2020   Lab Results  Component Value Date   CHOLHDL 2 06/02/2020   No results found for: HGBA1C    Assessment & Plan:   #1 intermittent vertigo past couple months.  Reproducible symptoms with head turned 45 degrees to the right and lying supine.  She does have a little bit of horizontal nystagmus on exam but otherwise nonfocal exam.  -Recommend trial Epley maneuvers with handout given  and instructions given.  If she is not seeing prompt improvement in vertigo the next few days be in touch -We discussed red flags of more worrisome vertigo to be on the watch for -We have encouraged her to follow-up promptly with her ophthalmologist regarding recent visual symptoms  #2 hypertension.  Currently untreated.  Multiple elevated readings recently.  Start back lisinopril 10 mg daily and set up 1 month follow-up.  She has done well with lisinopril in the past  Meds ordered this encounter  Medications   lisinopril (ZESTRIL) 10 MG tablet  Sig: Take 1 tablet (10 mg total) by mouth daily.    Dispense:  90 tablet    Refill:  3    Follow-up: No follow-ups on file.    Evelena Peat, MD

## 2021-05-05 NOTE — Patient Instructions (Signed)
Let me know in 1 week if vertigo not better with the Epley maneuvers.   ?

## 2021-05-06 ENCOUNTER — Encounter: Payer: Self-pay | Admitting: Family Medicine

## 2021-05-08 MED ORDER — ONDANSETRON HCL 4 MG PO TABS: 4.0000 mg | ORAL_TABLET | Freq: Three times a day (TID) | ORAL | 0 refills | Status: DC | PRN

## 2021-06-05 ENCOUNTER — Other Ambulatory Visit: Payer: Self-pay | Admitting: Family Medicine

## 2021-06-08 ENCOUNTER — Ambulatory Visit: Payer: Medicare Other | Admitting: Family Medicine

## 2021-06-09 DIAGNOSIS — Z79899 Other long term (current) drug therapy: Secondary | ICD-10-CM | POA: Diagnosis not present

## 2021-06-09 DIAGNOSIS — M0589 Other rheumatoid arthritis with rheumatoid factor of multiple sites: Secondary | ICD-10-CM | POA: Diagnosis not present

## 2021-06-09 IMAGING — CT CT HEART MORP W/ CTA COR W/ SCORE W/ CA W/CM &/OR W/O CM
4 of 7 series · 8 of 20 positions shown, 9 images · IV contrast (APPLIED)
Comparison: None.
COMPARISON: None.

Addendum:
EXAM:
OVER-READ INTERPRETATION  CT CHEST

The following report is an over-read performed by radiologist Dr.
Sshkumbinn Gimolli [REDACTED] on 01/28/2020. This
over-read does not include interpretation of cardiac or coronary
anatomy or pathology. The coronary calcium score/coronary CTA
interpretation by the cardiologist is attached.
CLINICAL DATA: 63 year old female with chest pain.
Cardiac/Coronary  CTA
TECHNIQUE: The patient was scanned on a Phillips Force scanner.

[Series 6: best diast · axial · 0.39mm/px · z∈[-380,-342]mm · 2 of 286 slices shown, 3 images]
[im 96/286  vessel]
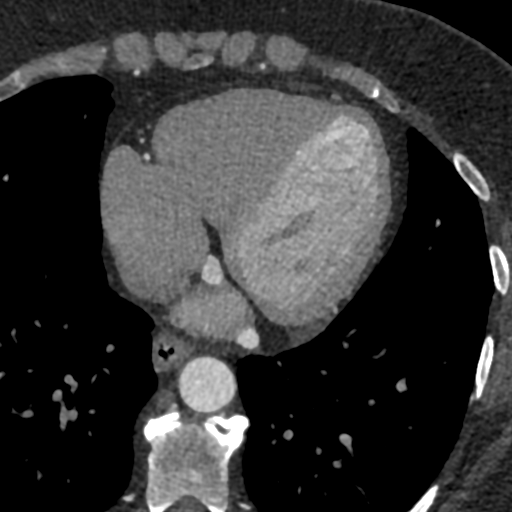
[im 96/286  lung]
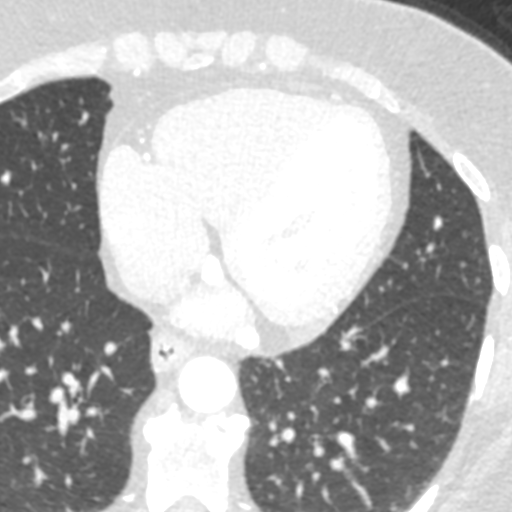
[im 191/286  vessel]
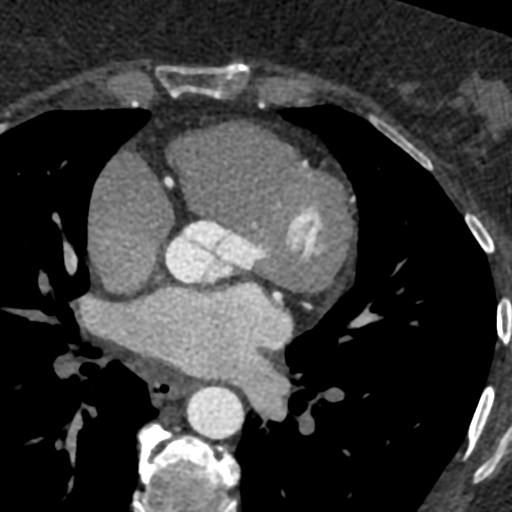

[Series 7: best syst · axial · 0.39mm/px · z∈[-380,-342]mm · 2 of 286 slices shown]
[im 96/286  vessel]
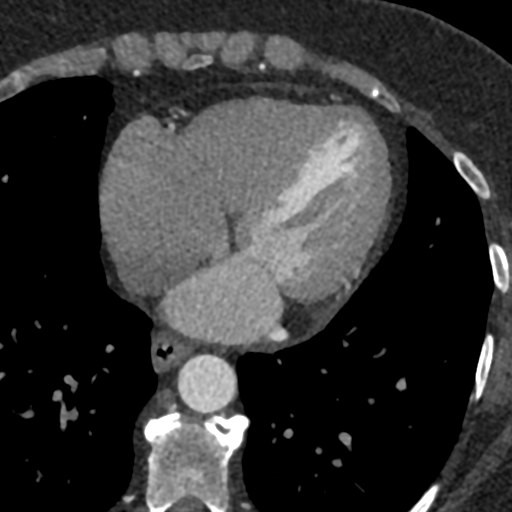
[im 191/286  vessel]
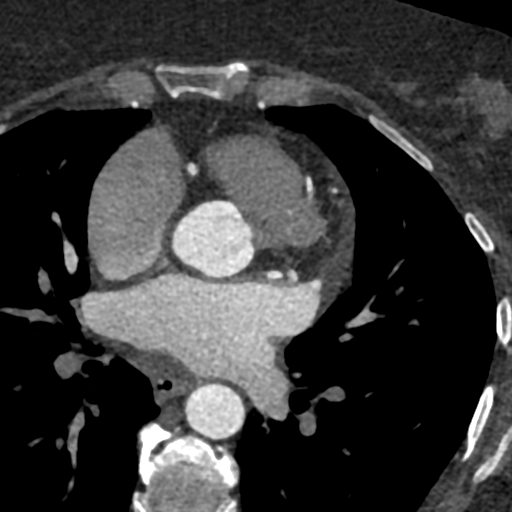

[Series 8: ts diast sharp · axial · 0.39mm/px · z∈[-380,-342]mm · 2 of 286 slices shown]
[im 96/286  lung]
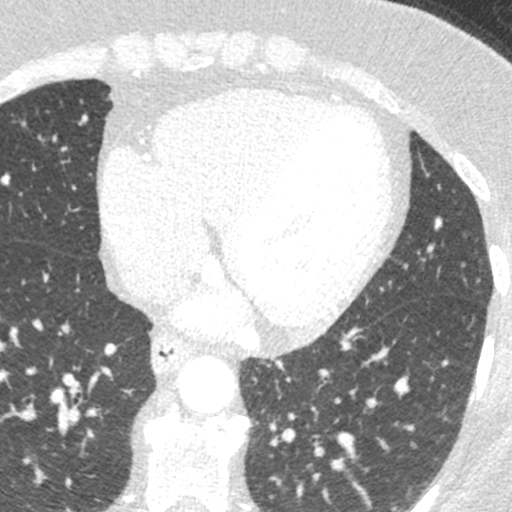
[im 191/286  lung]
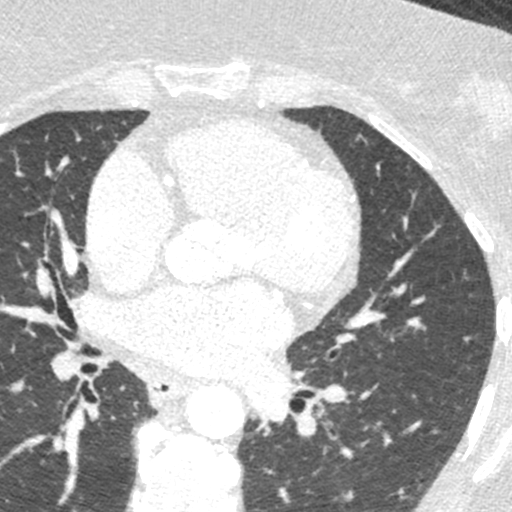

[Series 9: ts syst sharp · axial · 0.39mm/px · z∈[-380,-342]mm · 2 of 286 slices shown]
[im 96/286  lung]
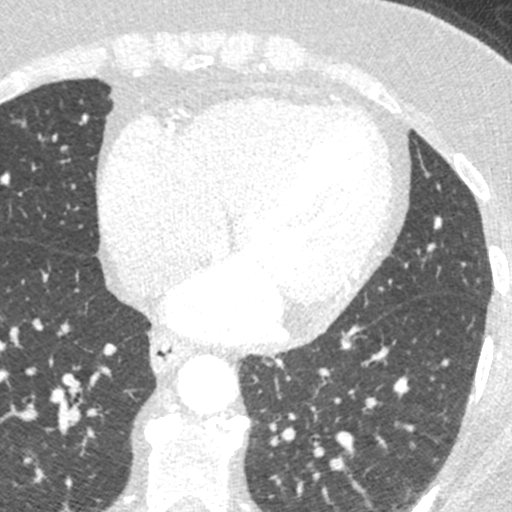
[im 191/286  lung]
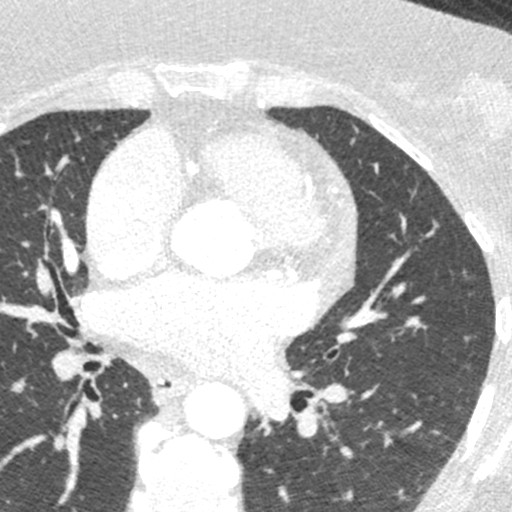

[8 of 20 positions shown; findings below may reference images not displayed]

FINDINGS: Aortic atherosclerosis. Within the visualized portions of the thorax
there are no suspicious appearing pulmonary nodules or masses, there
is no acute consolidative airspace disease, no pleural effusions, no
pneumothorax and no lymphadenopathy. Visualized portions of the
upper abdomen are unremarkable. There are no aggressive appearing
lytic or blastic lesions noted in the visualized portions of the
skeleton.
IMPRESSION: 1.  Aortic Atherosclerosis (BKIB1-OFE.E).
FINDINGS: A 110 kV prospective scan was triggered in the descending thoracic
aorta at 111 HU's. Axial non-contrast 3 mm slices were carried out
through the heart. The data set was analyzed on a dedicated work
station and scored using the Agatson method. Gantry rotation speed
was 250 msecs and collimation was .6 mm. Beta blockade and 0.8 mg of
sl NTG was given. The 3D data set was reconstructed in 5% intervals
of the 67-82 % of the R-R cycle. Diastolic phases were analyzed on a
dedicated work station using MPR, MIP and VRT modes. The patient
received 80 cc of contrast.

Aorta: Normal size. Mild scatter proximal ascending and descending
calcifications. No dissection.

Aortic Valve:  Trileaflet.  No calcifications.

Coronary Arteries:  Normal coronary origin.  Right dominance.

RCA is a large dominant artery that gives rise to PDA (not well
visualized) and PLA. There is no plaque.

Left main is a large artery that gives rise to LAD and LCX arteries.

LAD is a large vessel that has proximal focal calcified plaque that
is 25-49% stenosis, non flow limiting. The mid vessel tapers and
continues to wrap around apex.

LCX is a non-dominant artery that gives rise to one large OM1
branch. There is no plaque.

Other findings:

Normal pulmonary vein drainage into the left atrium.

Normal left atrial appendage without a thrombus.

Normal size of the pulmonary artery.

Please see radiology report for non cardiac findings.
IMPRESSION: 1. Coronary calcium score of 71. This was 81 percentile for age and
sex matched control.

2. Normal coronary origin with right dominance.

3. There is focal calcified proximal LAD plaque that is 25-49%
stenosis. Non flow limiting.

4. CAD-RADS 2. Mild non-obstructive CAD (25-49%). Consider
non-atherosclerotic causes of chest pain. Consider preventive
therapy and risk factor modification.

5.  Aortic atherosclerosis.

*** End of Addendum ***
EXAM:
OVER-READ INTERPRETATION  CT CHEST

The following report is an over-read performed by radiologist Dr.
Sshkumbinn Gimolli [REDACTED] on 01/28/2020. This
over-read does not include interpretation of cardiac or coronary
anatomy or pathology. The coronary calcium score/coronary CTA
interpretation by the cardiologist is attached.
FINDINGS: Aortic atherosclerosis. Within the visualized portions of the thorax
there are no suspicious appearing pulmonary nodules or masses, there
is no acute consolidative airspace disease, no pleural effusions, no
pneumothorax and no lymphadenopathy. Visualized portions of the
upper abdomen are unremarkable. There are no aggressive appearing
lytic or blastic lesions noted in the visualized portions of the
skeleton.
IMPRESSION: 1.  Aortic Atherosclerosis (BKIB1-OFE.E).

## 2021-06-10 ENCOUNTER — Ambulatory Visit (INDEPENDENT_AMBULATORY_CARE_PROVIDER_SITE_OTHER): Payer: Medicare Other | Admitting: Family Medicine

## 2021-06-10 ENCOUNTER — Encounter: Payer: Self-pay | Admitting: Family Medicine

## 2021-06-10 VITALS — BP 134/68 | HR 66 | Temp 97.5°F | Ht 64.0 in | Wt 219.6 lb

## 2021-06-10 DIAGNOSIS — I10 Essential (primary) hypertension: Secondary | ICD-10-CM | POA: Diagnosis not present

## 2021-06-10 MED ORDER — ONDANSETRON HCL 4 MG PO TABS: 4.0000 mg | ORAL_TABLET | Freq: Three times a day (TID) | ORAL | 0 refills | Status: DC | PRN

## 2021-06-10 NOTE — Patient Instructions (Signed)
BP improved.    Continue Lisinopril 10 mg daily ? ?Let me know if vertigo recurs.   ?

## 2021-06-10 NOTE — Progress Notes (Signed)
?Subjective:  ?  ? Patient ID: [name], female   DOB: 01/27/1956, 66 y.o.   MRN: [id number] ? ?HPI ? ?Seen for follow-up regarding hypertension.  She was taken off lisinopril at 1 point per cardiology but blood pressure was climbing back up and was elevated last visit.  He started back lisinopril 10 mg daily and she has seen significant improvement since then.  No dizziness.  No headaches.  No other side effects such as cough. ? ?She was having some severe vertigo symptoms and those have improved.  Still has occasional vertigo with sudden movement and things like driving on curvy roads.  No significant headache.  No ataxia.  No focal neurologic symptoms.  No acute hearing loss.  ? ?Past Medical History:  ?Diagnosis Date  ? ANGIOMA 10/03/2008  ? REMOVED FROM RIGHT LOWER LEG-BENIGN  ? Anxiety   ? Aortic atherosclerosis ([place])   ? Arthritis   ? RA  ? Bronchitis   ? hx of   ? Cancer [facility])   ? BASAL CELL SKIN CANCER  ? Complication of anesthesia   ? Coronary artery disease   ? COVID-19 06/18/2020  ? HA, chills, myalgias, congestion & fever, all symptoms resolved as of 12/29/20 per patient  ? Depression   ? Fibromyalgia   ? GERD 08/27/2008  ? History of blood transfusion   ? 1995  ? History of hiatal hernia   ? hx of has had surgically repaired  ? History of measles   ? History of mumps   ? History of nonmelanoma skin cancer   ? HYPERLIPIDEMIA 08/27/2008  ? HYPERTENSION 08/27/2008  ? pt is currently on no meds   ? Pneumonia   ? hx of   ? PONV (postoperative nausea and vomiting)   ? Rheumatoid arthritis ([place])   ? SEBORRHEIC KERATOSIS 08/27/2008  ? Sinus headache   ? Vocal cord paresis   ? hx of   ? Wears glasses   ? Wears partial dentures   ? ?Past Surgical History:  ?Procedure Laterality Date  ? ABDOMINAL HYSTERECTOMY  1995  ? APPENDECTOMY    ? bilateral shoulder replacements     ? BREAST BIOPSY Left   ? BREAST DUCTAL SYSTEM EXCISION  08/16/2011  ? Procedure: EXCISION DUCTAL SYSTEM BREAST;  Surgeon: Joyice Faster.  Cornett, MD;  Location: [place];  Service: General;  Laterality: Left;  left breast duct excision  ? CARPAL TUNNEL RELEASE Right 01/01/2021  ? Procedure: CARPAL TUNNEL RELEASE;  Surgeon: [name], MD;  Location: [facility];  Service: Orthopedics;  Laterality: Right;  ? [place]  ? times 2; 1980 and 1982  ? [place]  ? CHOLECYSTECTOMY  1997  ? COLONOSCOPY  09/15/2018  ? EXCISION/RELEASE BURSA HIP Right 10/21/2014  ? Procedure: OPEN RIGHT HIP BURSECTOMY, EXOSECTOMY;  Surgeon: Paralee Cancel, MD;  [facility];  Service: Orthopedics;  Laterality: Right;  ? foot surgery  2004, 2005, 2008  ? bilat secondary to neuropathy   ? JOINT REPLACEMENT  11/2010  ? lt total knee X2  ? KNEE ARTHROSCOPY  2007, 2008, 2009  ? LAPAROSCOPIC GASTRIC SLEEVE RESECTION N/A 10/30/2013  ? Procedure: LAPAROSCOPIC GASTRIC SLEEVE RESECTION WITH HIATAL HERNIA REPAIR AND UPPER ENDOSCOPY;  Surgeon: Greer Pickerel, MD;  Location[facility];  Service: General;  Laterality: N/A;  ? NASAL SINUS SURGERY  2013  ? OPEN SURGICAL REPAIR OF GLUTEAL TENDON  01/17/2012  ? Procedure: OPEN SURGICAL REPAIR  OF GLUTEAL TENDON;  Surgeon: Mauri Pole, MD;  Location[facility];  Service: Orthopedics;  Laterality: Right;  ? TOTAL KNEE ARTHROPLASTY Right 04/15/2020  ? Procedure: TOTAL KNEE ARTHROPLASTY;  Surgeon: Paralee Cancel, MD;  [facility];  Service: Orthopedics;  Laterality: Right;  70 mins  ? ULNAR TUNNEL RELEASE Right 01/01/2021  ? Procedure: CUBITAL TUNNEL RELEASE;  Surgeon: [name], MD;  Location[facility];  Service: Orthopedics;  Laterality: Right;  with MAC anesthesia needs 45 minutes  ? ? reports that she quit smoking about 37 years ago. Her smoking use included cigarettes. She has a 1.00 pack-year smoking history. She has never used smokeless tobacco. She reports that she does not drink alcohol and does not use drugs. ?family history includes Breast cancer (age of  onset: 59) in her mother; Cancer in her mother; Diabetes in her son; Epilepsy in her father; Heart attack in her sister and sister; Kidney disease in her father. ?Allergies  ?Allergen Reactions  ? Bee Venom Anaphylaxis  ? Keflex [Cephalexin] Hives  ?  Pt took po kelfex for sinus infection, developed blisters over entire body  ? Codeine Sulfate Other (See Comments)  ?  GI upset  ? Penicillamine Other (See Comments)  ? Penicillins Rash  ? ? ? ?Review of Systems  ?Constitutional:  Negative for fatigue.  ?Eyes:  Negative for visual disturbance.  ?Respiratory:  Negative for cough, chest tightness, shortness of breath and wheezing.   ?Cardiovascular:  Negative for chest pain, palpitations and leg swelling.  ?Neurological:  Negative for dizziness, seizures, syncope, weakness, light-headedness and headaches.  ? ?   ?Objective:  ? Physical Exam ?Constitutional:   ?   Appearance: She is well-developed.  ?Eyes:  ?   Pupils: Pupils are equal, round, and reactive to light.  ?Neck:  ?   Thyroid: No thyromegaly.  ?   Vascular: No JVD.  ?Cardiovascular:  ?   Rate and Rhythm: Normal rate and regular rhythm.  ?   Heart sounds:  ?  No gallop.  ?Pulmonary:  ?   Effort: Pulmonary effort is normal. No respiratory distress.  ?   Breath sounds: Normal breath sounds. No wheezing or rales.  ?Musculoskeletal:  ?   Cervical back: Neck supple.  ?Neurological:  ?   Mental Status: She is alert.  ? ? ?   ?Assessment:  ?   ?#1 hypertension improved back on lisinopril 10 mg daily ? ?#2 recent vertigo symptoms-overall improved.  Still has occasional breakthrough symptoms ?   ?Plan:  ?   ?-Continue lisinopril 10 mg daily ?-Continue nonpharmacologic lifestyle changes to improve blood pressure ? ?Eulas Post MD ?[facility] ? ?   ?

## 2021-06-21 ENCOUNTER — Other Ambulatory Visit: Payer: Self-pay | Admitting: Family Medicine

## 2021-06-22 NOTE — Telephone Encounter (Signed)
Last OV- 05/05/21 ?Last refill- 12/22/20-90 tabs, 1 refill ? ?No future OV scheduled.  ?

## 2021-06-23 ENCOUNTER — Other Ambulatory Visit: Payer: Self-pay | Admitting: Cardiology

## 2021-07-09 ENCOUNTER — Telehealth: Payer: Self-pay | Admitting: Family Medicine

## 2021-07-09 NOTE — Telephone Encounter (Signed)
Left message for patient to call back and schedule Medicare Annual Wellness Visit (AWV) either virtually or in office. Left  my Herbie Drape number [phone]   Last AWV 07/11/20  please schedule at anytime with [facility] Nurse Health Advisor 1 or 2

## 2021-07-14 ENCOUNTER — Ambulatory Visit (INDEPENDENT_AMBULATORY_CARE_PROVIDER_SITE_OTHER): Payer: Medicare Other

## 2021-07-14 VITALS — BP 122/62 | HR 66 | Temp 98.1°F | Ht 64.5 in | Wt 222.4 lb

## 2021-07-14 DIAGNOSIS — Z0000 Encounter for general adult medical examination without abnormal findings: Secondary | ICD-10-CM | POA: Diagnosis not present

## 2021-07-14 DIAGNOSIS — Z23 Encounter for immunization: Secondary | ICD-10-CM

## 2021-07-14 NOTE — Progress Notes (Signed)
Subjective:   [name] is a 66 y.o. female who presents for Medicare Annual (Subsequent) preventive examination.  Review of Systems     Cardiac Risk Factors include: advanced age (>20mn, >>66women);dyslipidemia;hypertension;obesity (BMI >30kg/m2)     Objective:    Today's Vitals   07/14/21 1131 07/14/21 1132  BP: 122/62   Pulse: 66   Temp: 98.1 F (36.7 C)   TempSrc: Oral   SpO2: 99%   Weight: 222 lb 6.4 oz (100.9 kg)   Height: 5' 4.5" (1.638 m)   PainSc:  3    Body mass index is 37.59 kg/m.     07/14/2021   11:36 AM 01/01/2021   10:34 AM 07/11/2020    3:45 PM 05/30/2020   11:06 PM 04/22/2020    3:50 PM 04/15/2020   11:00 AM 04/08/2020   10:05 AM  Advanced Directives  Does Patient Have a Medical Advance Directive? Yes _0  No  Type of [place]amed[place]of [place] will        Copy of [place] Chart? No - copy requested        Would patient like information on creating a medical advance directive?  No - Patient declined    No - Patient declined     Current Medications (verified) Outpatient Encounter Medications as of 07/14/2021  Medication Sig   Biotin 10 MG CAPS Take 10 mg by mouth daily.   EPINEPHrine (EPIPEN 2-PAK) 0.3 mg/0.3 mL IJ SOAJ injection Inject 0.3 mLs (0.3 mg total) into the muscle once. (Patient taking differently: Inject 0.3 mg into the muscle as needed for anaphylaxis.)   famotidine (PEPCID) 40 MG tablet Take 40 mg by mouth at bedtime.   folic acid (FOLVITE) 8967MCG tablet Take 1,600 mcg by mouth daily.   Golimumab (SIMPONI [place]) Inject into the skin. Is an infusion   HYDROcodone-acetaminophen (NORCO/VICODIN) 5-325 MG tablet 1 tablet as needed   lisinopril (ZESTRIL) 10 MG tablet Take 1 tablet (10 mg total) by mouth daily.   methotrexate 50 MG/2ML injection Last dose 12/19/20 until after surgery (pt stated she was told by her surgeon to stop until after surgery on 01/01/21)   ondansetron  (ZOFRAN) 4 MG tablet Take 1 tablet (4 mg total) by mouth every 8 (eight) hours as needed for nausea or vomiting.   predniSONE (DELTASONE) 5 MG tablet Take 5 mg by mouth daily with breakfast. 2 tabs every am 10 mg   rosuvastatin (CRESTOR) 20 MG tablet Take 1 tablet by mouth once daily   sertraline (ZOLOFT) 100 MG tablet Take 1 tablet by mouth once daily   zolpidem (AMBIEN) 10 MG tablet TAKE 1 TABLET BY MOUTH AT BEDTIME AS NEEDED FOR SLEEP   B-D TB SYRINGE 1CC/27GX1/2" 27G X 1/2" 1 ML MISC    No facility-administered encounter medications on file as of 07/14/2021.    Allergies (verified) Bee venom, Keflex [cephalexin], Codeine sulfate, Penicillamine, and Penicillins   History: Past Medical History:  Diagnosis Date   ANGIOMA 10/03/2008   REMOVED FROM RIGHT LOWER LEG-BENIGN   Anxiety    Aortic atherosclerosis (HCC)    Arthritis    RA   Bronchitis    hx of    Cancer (HCC)    BASAL CELL SKIN CANCER   Complication of anesthesia    Coronary artery disease    COVID-19 06/18/2020   HA, chills, myalgias, congestion & fever, all symptoms resolved as of 12/29/20 per patient  Depression    Fibromyalgia    GERD 08/27/2008   History of blood transfusion    1995   History of hiatal hernia    hx of has had surgically repaired   History of measles    History of mumps    History of nonmelanoma skin cancer    HYPERLIPIDEMIA 08/27/2008   HYPERTENSION 08/27/2008   pt is currently on no meds    Pneumonia    hx of    PONV (postoperative nausea and vomiting)    Rheumatoid arthritis ([place])    SEBORRHEIC KERATOSIS 08/27/2008   Sinus headache    Vocal cord paresis    hx of    Wears glasses    Wears partial dentures    Past Surgical History:  Procedure Laterality Date   ABDOMINAL HYSTERECTOMY  1995   APPENDECTOMY     bilateral shoulder replacements      BREAST BIOPSY Left    BREAST DUCTAL SYSTEM EXCISION  08/16/2011   Procedure: EXCISION DUCTAL SYSTEM BREAST;  Surgeon: Marcello Moores A. Cornett,  MD;  Location: [facility];  Service: General;  Laterality: Left;  left breast duct excision   CARPAL TUNNEL RELEASE Right 01/01/2021   Procedure: CARPAL TUNNEL RELEASE;  Surgeon: [name], MD;  Location: [facility];  Service: Orthopedics;  Laterality: Right;   CESAREAN SECTION  1980   times 2; 1980 and [place]   COLONOSCOPY  09/15/2018   EXCISION/RELEASE BURSA HIP Right 10/21/2014   Procedure: OPEN RIGHT HIP BURSECTOMY, EXOSECTOMY;  Surgeon: Paralee Cancel, MD;  Location: [facility];  Service: Orthopedics;  Laterality: Right;   foot surgery  2004, 2005, 2008   bilat secondary to neuropathy    JOINT REPLACEMENT  11/2010   lt total knee X2   KNEE ARTHROSCOPY  2007, 2008, 2009   LAPAROSCOPIC GASTRIC SLEEVE RESECTION N/A 10/30/2013   Procedure: LAPAROSCOPIC GASTRIC SLEEVE RESECTION WITH HIATAL HERNIA REPAIR AND UPPER ENDOSCOPY;  Surgeon: Greer Pickerel, MD;  Location: [facility];  Service: General;  Laterality: N/A;   NASAL SINUS SURGERY  2013   OPEN SURGICAL REPAIR OF GLUTEAL TENDON  01/17/2012   Procedure: OPEN SURGICAL REPAIR OF GLUTEAL TENDON;  Surgeon: Mauri Pole, MD;  Location: [facility];  Service: Orthopedics;  Laterality: Right;   TOTAL KNEE ARTHROPLASTY Right 04/15/2020   Procedure: TOTAL KNEE ARTHROPLASTY;  Surgeon: Paralee Cancel, MD;  Location: [facility];  Service: Orthopedics;  Laterality: Right;  70 mins   ULNAR TUNNEL RELEASE Right 01/01/2021   Procedure: CUBITAL TUNNEL RELEASE;  Surgeon: [name], MD;  Location: [facility];  Service: Orthopedics;  Laterality: Right;  with MAC anesthesia needs 45 minutes   Family History  Problem Relation Age of Onset   Cancer Mother        breast   Breast cancer Mother 80   Kidney disease Father    Epilepsy Father    Heart attack Sister    Heart attack Sister    Diabetes Son    Social History   Socioeconomic History   Marital status: Married     Spouse name: Not on file   Number of children: 2   Years of education: Not on file   Highest education level: 12th grade  Occupational History   Occupation: retired    Comment: Building services engineer  Tobacco Use   Smoking status: Former    Packs/day: 0.25    Years:  4.00    Pack years: 1.00    Types: Cigarettes    Quit date: 02/23/1984    Years since quitting: 37.4   Smokeless tobacco: Never  Vaping Use   Vaping Use: Never used  Substance and Sexual Activity   Alcohol use: No   Drug use: No   Sexual activity: Not on file  Other Topics Concern   Not on file  Social History Narrative   Lives at home with husband   Disabled   Right handed   Caffeine: 1 cup of coffee daily   Social Determinants of Health   Financial Resource Strain: Low Risk    Difficulty of Paying Living Expenses: Not hard at all  Food Insecurity: No Food Insecurity   Worried About Charity fundraiser in the Last Year: Never true   Ran Out of Food in the Last Year: Never true  Transportation Needs: No Transportation Needs   Lack of Transportation (Medical): No   Lack of Transportation (Non-Medical): No  Physical Activity: Insufficiently Active   Days of Exercise per Week: 3 days   Minutes of Exercise per Session: 20 min  Stress: No Stress Concern Present   Feeling of Stress : Not at all  Social Connections: Socially Integrated   Frequency of Communication with Friends and Family: More than three times a week   Frequency of Social Gatherings with Friends and Family: More than three times a week   Attends Religious Services: More than 4 times per year   Active Member of [employer] or Organizations: Yes   Attends Music therapist: More than 4 times per year   Marital Status: Married    Tobacco Counseling Counseling given: Not Answered   Clinical Intake:  Pre-visit preparation completed: Yes  Pain : 0-10 Pain Score: 3  Pain Type: Chronic pain Pain Descriptors / Indicators: Aching Pain  Onset: More than a month ago Pain Frequency: Constant     Nutritional Status: BMI > 30  Obese Nutritional Risks: None Diabetes: No  How often do you need to have someone help you when you read instructions, pamphlets, or other written materials from your doctor or pharmacy?: 1 - Never What is the last grade level you completed in school?: 12th grade  Diabetic? no  Interpreter Needed?: No  Information entered by :: NAllen LPN   Activities of Daily Living    07/14/2021   11:37 AM 07/13/2021    6:34 PM  In your present state of health, do you have any difficulty performing the following activities:  Hearing? 0 0  Vision? 0 0  Difficulty concentrating or making decisions? 0 0  Walking or climbing stairs? 1 1  Dressing or bathing? 0 0  Doing errands, shopping? 0 0  Preparing Food and eating ? N N  Using the Toilet? N N  In the past six months, have you accidently leaked urine? Y Y  Do you have problems with loss of bowel control? N N  Managing your Medications? N N  Managing your Finances? N N  Housekeeping or managing your Housekeeping? N N    Patient Care Team: Eulas Post, MD as PCP - General Jerline Pain, MD as PCP - Cardiology (Cardiology) Hennie Duos, MD as Consulting Physician (Rheumatology)  Indicate any recent Medical Services you may have received from other than [facility] providers in the past year (date may be approximate).     Assessment:   This is a routine wellness examination for [name].  Hearing/Vision screen Vision Screening - Comments:: Regular eye exams, [facility]  Dietary issues and exercise activities discussed: Current Exercise Habits: Home exercise routine, Type of exercise: walking, Time (Minutes): 20, Frequency (Times/Week): 3, Weekly Exercise (Minutes/Week): 60   Goals Addressed             This Visit's Progress    Patient Stated       07/14/2021, wants to lose weight, get under 200 pounds       Depression Screen     07/14/2021   11:37 AM 06/10/2021    4:11 PM 07/11/2020    3:46 PM 12/05/2019    9:04 AM  PHQ 2/9 Scores  PHQ - 2 Score 0 0 0 0  PHQ- 9 Score  0  4    Fall Risk    07/14/2021   11:37 AM 07/13/2021    6:34 PM 03/02/2021    2:27 PM 07/11/2020    3:46 PM 06/15/2016    8:28 AM  [place] in the past year? 0 0 0 0 Yes  Number falls in past yr: 0 0  0 1  Injury with Fall? 0   0 No  Risk for fall due to : Medication side effect      Follow up Falls evaluation completed;Education provided;Falls prevention discussed   Falls evaluation completed Falls evaluation completed    FALL RISK PREVENTION PERTAINING TO THE HOME:  Any stairs in or around the home? No  If so, are there any without handrails?  N/a Home free of loose throw rugs in walkways, pet beds, electrical cords, etc? Yes  Adequate lighting in your home to reduce risk of falls? Yes   ASSISTIVE DEVICES UTILIZED TO PREVENT FALLS:  Life alert? No  Use of a cane, walker or w/c? No  Grab bars in the bathroom? Yes  Shower chair or bench in shower? Yes  Elevated toilet seat or a handicapped toilet? Yes   TIMED UP AND GO:  Was the test performed? No .    Gait slow and steady without use of assistive device  Cognitive Function:        07/14/2021   11:38 AM  6CIT Screen  What Year? 0 points  What month? 0 points  What time? 0 points  Count back from 20 0 points  Months in reverse 2 points  Repeat phrase 2 points  Total Score 4 points    Immunizations Immunization History  Administered Date(s) Administered   Fluad Quad(high Dose 65+) 12/05/2019   Influenza Split 12/01/2010, 11/12/2011   Influenza, High Dose Seasonal PF 11/19/2015, 11/25/2016, 12/04/2019   Influenza,inj,Quad PF,6+ Mos 12/01/2012, 11/09/2013, 11/27/2014, 11/25/2017, 11/13/2018   Influenza-Unspecified 11/13/2018, 11/14/2018   Moderna Covid-19 Vaccine Bivalent Booster 52yr & up 11/11/2020   Moderna Sars-Covid-2 Vaccination 05/14/2019, 06/11/2019,  11/23/2019, 06/10/2020   PNEUMOCOCCAL CONJUGATE-20 07/14/2021   Pneumococcal Polysaccharide-23 02/22/2006, 12/01/2012   Td 08/28/2009   Tdap 11/05/1996   Zoster Recombinat (Shingrix) 11/13/2018, 03/06/2019    TDAP status: Due, Education has been provided regarding the importance of this vaccine. Advised may receive this vaccine at local pharmacy or Health Dept. Aware to provide a copy of the vaccination record if obtained from local pharmacy or Health Dept. Verbalized acceptance and understanding.  Flu Vaccine status: Up to date  Pneumococcal vaccine status: Completed during today's visit.  Covid-19 vaccine status: Completed vaccines  Qualifies for Shingles Vaccine? Yes   Zostavax completed Yes   Shingrix Completed?: Yes  Screening Tests Health  Maintenance  Topic Date Due   TETANUS/TDAP  08/29/2019   PAP SMEAR-Modifier  05/29/2041 (Originally 02/22/2010)   INFLUENZA VACCINE  09/22/2021   MAMMOGRAM  01/22/2022   COLONOSCOPY (Pts 45-41yr Insurance coverage will need to be confirmed)  09/14/2028   Pneumonia Vaccine 66 Years old  Completed   DEXA SCAN  Completed   COVID-19 Vaccine  Completed   Hepatitis C Screening  Completed   HIV Screening  Completed   Zoster Vaccines- Shingrix  Completed   HPV VACCINES  Aged Out    Health Maintenance  Health Maintenance Due  Topic Date Due   TETANUS/TDAP  08/29/2019    Colorectal cancer screening: Type of screening: Colonoscopy. Completed 09/15/2018. Repeat every 5 years  Mammogram status: Completed 01/22/2021. Repeat every year  Bone Density status: Completed 01/22/2021.   Lung Cancer Screening: (Low Dose CT Chest recommended if Age 66-80years, 30 pack-year currently smoking OR have quit w/in 15years.) does not qualify.   Lung Cancer Screening Referral: no  Additional Screening:  Hepatitis C Screening: does qualify; Completed 05/16/2017  Vision Screening: Recommended annual ophthalmology exams for early detection of glaucoma and  other disorders of the eye. Is the patient up to date with their annual eye exam?  Yes  Who is the provider or what is the name of the office in which the patient attends annual eye exams? [facility] If pt is not established with a provider, would they like to be referred to a provider to establish care? No .   Dental Screening: Recommended annual dental exams for proper oral hygiene  Community Resource Referral / Chronic Care Management: CRR required this visit?  No   CCM required this visit?  No      Plan:     I have personally reviewed and noted the following in the patient's chart:   Medical and social history Use of alcohol, tobacco or illicit drugs  Current medications and supplements including opioid prescriptions.  Functional ability and status Nutritional status Physical activity Advanced directives List of other physicians Hospitalizations, surgeries, and ER visits in previous 12 months Vitals Screenings to include cognitive, depression, and falls Referrals and appointments  In addition, I have reviewed and discussed with patient certain preventive protocols, quality metrics, and best practice recommendations. A written personalized care plan for preventive services as well as general preventive health recommendations were provided to patient.     NKellie Simmering LPN   [id number]  Nurse Notes: none

## 2021-07-14 NOTE — Patient Instructions (Signed)
[name] , Thank you for taking time to come for your Medicare Wellness Visit. I appreciate your ongoing commitment to your health goals. Please review the following plan we discussed and let me know if I can assist you in the future.   Screening recommendations/referrals: Colonoscopy: completed 09/15/2018, due 09/15/2023 Mammogram: completed 01/22/2021, due 01/23/2022 Bone Density: completed 01/22/2021 Recommended yearly ophthalmology/optometry visit for glaucoma screening and checkup Recommended yearly dental visit for hygiene and checkup  Vaccinations: Influenza vaccine: due 09/22/2021 Pneumococcal vaccine: today Tdap vaccine: due Shingles vaccine: completed   Covid-19: 11/11/2020, 06/10/2020, 11/23/2019, 06/11/2019, 05/14/2019  Advanced directives: Please bring a copy of your POA (Power of Attorney) and/or Living Will to your next appointment.    Conditions/risks identified: none  Next appointment: Follow up in one year for your annual wellness visit    Preventive Care 65 Years and Older, Female Preventive care refers to lifestyle choices and visits with your health care provider that can promote health and wellness. What does preventive care include? A yearly physical exam. This is also called an annual well check. Dental exams once or twice a year. Routine eye exams. Ask your health care provider how often you should have your eyes checked. Personal lifestyle choices, including: Daily care of your teeth and gums. Regular physical activity. Eating a healthy diet. Avoiding tobacco and drug use. Limiting alcohol use. Practicing safe sex. Taking low-dose aspirin every day. Taking vitamin and mineral supplements as recommended by your health care provider. What happens during an annual well check? The services and screenings done by your health care provider during your annual well check will depend on your age, overall health, lifestyle risk factors, and family history of  disease. Counseling  Your health care provider may ask you questions about your: Alcohol use. Tobacco use. Drug use. Emotional well-being. Home and relationship well-being. Sexual activity. Eating habits. History of falls. Memory and ability to understand (cognition). Work and work Statistician. Reproductive health. Screening  You may have the following tests or measurements: Height, weight, and BMI. Blood pressure. Lipid and cholesterol levels. These may be checked every 5 years, or more frequently if you are over 32 years old. Skin check. Lung cancer screening. You may have this screening every year starting at age 39 if you have a 30-pack-year history of smoking and currently smoke or have quit within the past 15 years. Fecal occult blood test (FOBT) of the stool. You may have this test every year starting at age 58. Flexible sigmoidoscopy or colonoscopy. You may have a sigmoidoscopy every 5 years or a colonoscopy every 10 years starting at age 40. Hepatitis C blood test. Hepatitis B blood test. Sexually transmitted disease (STD) testing. Diabetes screening. This is done by checking your blood sugar (glucose) after you have not eaten for a while (fasting). You may have this done every 1-3 years. Bone density scan. This is done to screen for osteoporosis. You may have this done starting at age 74. Mammogram. This may be done every 1-2 years. Talk to your health care provider about how often you should have regular mammograms. Talk with your health care provider about your test results, treatment options, and if necessary, the need for more tests. Vaccines  Your health care provider may recommend certain vaccines, such as: Influenza vaccine. This is recommended every year. Tetanus, diphtheria, and acellular pertussis (Tdap, Td) vaccine. You may need a Td booster every 10 years. Zoster vaccine. You may need this after age 75. Pneumococcal 13-valent conjugate (PCV13)  vaccine. One  dose is recommended after age 34. Pneumococcal polysaccharide (PPSV23) vaccine. One dose is recommended after age 43. Talk to your health care provider about which screenings and vaccines you need and how often you need them. This information is not intended to replace advice given to you by your health care provider. Make sure you discuss any questions you have with your health care provider. Document Released: 03/07/2015 Document Revised: 10/29/2015 Document Reviewed: 12/10/2014 Elsevier Interactive Patient Education  2017 [place] Prevention in the Home Falls can cause injuries. They can happen to people of all ages. There are many things you can do to make your home safe and to help prevent falls. What can I do on the outside of my home? Regularly fix the edges of walkways and driveways and fix any cracks. Remove anything that might make you trip as you walk through a door, such as a raised step or threshold. Trim any bushes or trees on the path to your home. Use bright outdoor lighting. Clear any walking paths of anything that might make someone trip, such as rocks or tools. Regularly check to see if handrails are loose or broken. Make sure that both sides of any steps have handrails. Any raised decks and porches should have guardrails on the edges. Have any leaves, snow, or ice cleared regularly. Use sand or salt on walking paths during winter. Clean up any spills in your garage right away. This includes oil or grease spills. What can I do in the bathroom? Use night lights. Install grab bars by the toilet and in the tub and shower. Do not use towel bars as grab bars. Use non-skid mats or decals in the tub or shower. If you need to sit down in the shower, use a plastic, non-slip stool. Keep the floor dry. Clean up any water that spills on the floor as soon as it happens. Remove soap buildup in the tub or shower regularly. Attach bath mats securely with double-sided  non-slip rug tape. Do not have throw rugs and other things on the floor that can make you trip. What can I do in the bedroom? Use night lights. Make sure that you have a light by your bed that is easy to reach. Do not use any sheets or blankets that are too big for your bed. They should not hang down onto the floor. Have a firm chair that has side arms. You can use this for support while you get dressed. Do not have throw rugs and other things on the floor that can make you trip. What can I do in the kitchen? Clean up any spills right away. Avoid walking on wet floors. Keep items that you use a lot in easy-to-reach places. If you need to reach something above you, use a strong step stool that has a grab bar. Keep electrical cords out of the way. Do not use floor polish or wax that makes floors slippery. If you must use wax, use non-skid floor wax. Do not have throw rugs and other things on the floor that can make you trip. What can I do with my stairs? Do not leave any items on the stairs. Make sure that there are handrails on both sides of the stairs and use them. Fix handrails that are broken or loose. Make sure that handrails are as long as the stairways. Check any carpeting to make sure that it is firmly attached to the stairs. Fix any carpet that is loose  or worn. Avoid having throw rugs at the top or bottom of the stairs. If you do have throw rugs, attach them to the floor with carpet tape. Make sure that you have a light switch at the top of the stairs and the bottom of the stairs. If you do not have them, ask someone to add them for you. What else can I do to help prevent falls? Wear shoes that: Do not have high heels. Have rubber bottoms. Are comfortable and fit you well. Are closed at the toe. Do not wear sandals. If you use a stepladder: Make sure that it is fully opened. Do not climb a closed stepladder. Make sure that both sides of the stepladder are locked into place. Ask  someone to hold it for you, if possible. Clearly mark and make sure that you can see: Any grab bars or handrails. First and last steps. Where the edge of each step is. Use tools that help you move around (mobility aids) if they are needed. These include: Canes. Walkers. Scooters. Crutches. Turn on the lights when you go into a dark area. Replace any light bulbs as soon as they burn out. Set up your furniture so you have a clear path. Avoid moving your furniture around. If any of your floors are uneven, fix them. If there are any pets around you, be aware of where they are. Review your medicines with your doctor. Some medicines can make you feel dizzy. This can increase your chance of falling. Ask your doctor what other things that you can do to help prevent falls. This information is not intended to replace advice given to you by your health care provider. Make sure you discuss any questions you have with your health care provider. Document Released: 12/05/2008 Document Revised: 07/17/2015 Document Reviewed: 03/15/2014 Elsevier Interactive Patient Education  2017 [employer].

## 2021-07-20 ENCOUNTER — Other Ambulatory Visit: Payer: Self-pay | Admitting: Family Medicine

## 2021-08-04 ENCOUNTER — Other Ambulatory Visit: Payer: Self-pay | Admitting: Cardiology

## 2021-08-04 DIAGNOSIS — Z79899 Other long term (current) drug therapy: Secondary | ICD-10-CM | POA: Diagnosis not present

## 2021-08-04 DIAGNOSIS — M0589 Other rheumatoid arthritis with rheumatoid factor of multiple sites: Secondary | ICD-10-CM | POA: Diagnosis not present

## 2021-08-05 DIAGNOSIS — M1991 Primary osteoarthritis, unspecified site: Secondary | ICD-10-CM | POA: Diagnosis not present

## 2021-08-05 DIAGNOSIS — M0589 Other rheumatoid arthritis with rheumatoid factor of multiple sites: Secondary | ICD-10-CM | POA: Diagnosis not present

## 2021-08-05 DIAGNOSIS — M797 Fibromyalgia: Secondary | ICD-10-CM | POA: Diagnosis not present

## 2021-08-05 DIAGNOSIS — Z6837 Body mass index (BMI) 37.0-37.9, adult: Secondary | ICD-10-CM | POA: Diagnosis not present

## 2021-08-05 DIAGNOSIS — G5603 Carpal tunnel syndrome, bilateral upper limbs: Secondary | ICD-10-CM | POA: Diagnosis not present

## 2021-08-05 DIAGNOSIS — E669 Obesity, unspecified: Secondary | ICD-10-CM | POA: Diagnosis not present

## 2021-08-06 ENCOUNTER — Telehealth: Payer: Self-pay | Admitting: Cardiology

## 2021-08-06 MED ORDER — ROSUVASTATIN CALCIUM 20 MG PO TABS: 20.0000 mg | ORAL_TABLET | Freq: Every day | ORAL | 0 refills | Status: DC

## 2021-08-06 NOTE — Telephone Encounter (Signed)
Pt's medication was already sent to pt's pharmacy as requesting, asking to make overdue appt before anymore refills on 08/04/21. Confirmation received.

## 2021-08-06 NOTE — Telephone Encounter (Signed)
*  STAT* If patient is at the pharmacy, call can be transferred to refill team.   1. Which medications need to be refilled? (please list name of each medication and dose if known) new prescription for  Rosuvastatin  2. Which pharmacy/location (including street and city if local pharmacy) is medication to be sent to? [facility] [place]  3. Do they need a 30 day or 90 day supply? 90 days and refills

## 2021-08-06 NOTE — Telephone Encounter (Signed)
Pt's medication was sent to pt's pharmacy as requested. Confirmation received.  °

## 2021-08-06 NOTE — Telephone Encounter (Signed)
*  STAT* If patient is at the pharmacy, call can be transferred to refill team.   1. Which medications need to be refilled? (please list name of each medication and dose if known)  rosuvastatin (CRESTOR) 20 MG tablet  2. Which pharmacy/location (including street and city if local pharmacy) is medication to be sent to? [place], [address]  3. Do they need a 30 day or 90 day supply?  90 day  Patient is scheduled to see Dr. Marlou Porch on 09/28/21 at [facility]

## 2021-09-14 ENCOUNTER — Other Ambulatory Visit: Payer: Self-pay

## 2021-09-14 MED ORDER — ROSUVASTATIN CALCIUM 20 MG PO TABS: 20.0000 mg | ORAL_TABLET | Freq: Every day | ORAL | 0 refills | Status: DC

## 2021-09-15 ENCOUNTER — Encounter: Payer: Self-pay | Admitting: Family Medicine

## 2021-09-15 MED ORDER — SERTRALINE HCL 100 MG PO TABS: 100.0000 mg | ORAL_TABLET | Freq: Every day | ORAL | 0 refills | Status: DC

## 2021-09-28 ENCOUNTER — Ambulatory Visit (INDEPENDENT_AMBULATORY_CARE_PROVIDER_SITE_OTHER): Payer: Medicare Other | Admitting: Cardiology

## 2021-09-28 ENCOUNTER — Encounter: Payer: Self-pay | Admitting: Cardiology

## 2021-09-28 VITALS — BP 130/70 | HR 57 | Ht 64.5 in | Wt 215.0 lb

## 2021-09-28 DIAGNOSIS — R002 Palpitations: Secondary | ICD-10-CM | POA: Diagnosis not present

## 2021-09-28 DIAGNOSIS — I2510 Atherosclerotic heart disease of native coronary artery without angina pectoris: Secondary | ICD-10-CM | POA: Diagnosis not present

## 2021-09-28 DIAGNOSIS — E7800 Pure hypercholesterolemia, unspecified: Secondary | ICD-10-CM

## 2021-09-28 NOTE — Patient Instructions (Signed)
Medication Instructions:  Your physician recommends that you continue on your current medications as directed. Please refer to the Current Medication list given to you today.  *If you need a refill on your cardiac medications before your next appointment, please call your pharmacy*   Lab Work: NONE If you have labs (blood work) drawn today and your tests are completely normal, you will receive your results only by: [place] (if you have MyChart) OR A paper copy in the mail If you have any lab test that is abnormal or we need to change your treatment, we will call you to review the results.   Testing/Procedures: NONE   Follow-Up: At [facility], you and your health needs are our priority.  As part of our continuing mission to provide you with exceptional heart care, we have created designated Provider Care Teams.  These Care Teams include your primary Cardiologist (physician) and Advanced Practice Providers (APPs -  Physician Assistants and Nurse Practitioners) who all work together to provide you with the care you need, when you need it.  We recommend signing up for the patient portal called "MyChart".  Sign up information is provided on this After Visit Summary.  MyChart is used to connect with patients for Virtual Visits (Telemedicine).  Patients are able to view lab/test results, encounter notes, upcoming appointments, etc.  Non-urgent messages can be sent to your provider as well.   To learn more about what you can do with MyChart, go to [email or web address].    Your next appointment:   1 year(s)  The format for your next appointment:   In Person  Provider:   Candee Furbish, MD {   Important Information About Sugar

## 2021-09-28 NOTE — Progress Notes (Signed)
Cardiology Office Note:    Date:  09/28/2021   ID:  [name], DOB 08/18/1955, MRN [id number]  PCP:  Eulas Post, MD   [facility] Providers Cardiologist:  Candee Furbish, MD     Referring MD: Eulas Post, MD    History of Present Illness:    [name] is a 66 y.o. female here for follow-up family history of coronary artery disease.  Having some palpitations at night.  Noticing a few skipped beats especially when getting ready for bed.  Not noticing them as much during the day.  No syncope.  Dr. Elease Hashimoto, his note from 12/24/2019, she had a few days of "feeling bad "intermittent nausea few episodes of vomiting.  No abdominal pain.  She seemed to be more fatigued than usual.  She drove up to [place] and she had a sensation of feeling very hot all over with waves of nausea and potentially a brief loss of consciousness.  May be a brief syncopal episode.   She has had appendectomy and cholecystectomy and hysterectomy as well as gastric sleeve.   Hyperlipidemia non-smoker nondiabetic   Brother, 3 sisters and MI, one died.  All before 50. Son MI.   She ended up having an echocardiogram as well as cardiac/coronary CT scan.  She did have calcified plaque but this was nonflow limiting.  Aortic atherosclerosis was noted as well.  No current chest pain fevers chills nausea vomiting.  We increased her Crestor to 20 mg for high intensity plaque stabilization.  Overall doing very well.  Past Medical History:  Diagnosis Date   ANGIOMA 10/03/2008   REMOVED FROM RIGHT LOWER LEG-BENIGN   Anxiety    Aortic atherosclerosis (HCC)    Arthritis    RA   Bronchitis    hx of    Cancer (HCC)    BASAL CELL SKIN CANCER   Complication of anesthesia    Coronary artery disease    COVID-19 06/18/2020   HA, chills, myalgias, congestion & fever, all symptoms resolved as of 12/29/20 per patient   Depression    Fibromyalgia    GERD 08/27/2008   History of blood transfusion     1995   History of hiatal hernia    hx of has had surgically repaired   History of measles    History of mumps    History of nonmelanoma skin cancer    HYPERLIPIDEMIA 08/27/2008   HYPERTENSION 08/27/2008   pt is currently on no meds    Pneumonia    hx of    PONV (postoperative nausea and vomiting)    Rheumatoid arthritis ([place])    SEBORRHEIC KERATOSIS 08/27/2008   Sinus headache    Vocal cord paresis    hx of    Wears glasses    Wears partial dentures     Past Surgical History:  Procedure Laterality Date   ABDOMINAL HYSTERECTOMY  1995   APPENDECTOMY     bilateral shoulder replacements      BREAST BIOPSY Left    BREAST DUCTAL SYSTEM EXCISION  08/16/2011   Procedure: EXCISION DUCTAL SYSTEM BREAST;  Surgeon: Marcello Moores A. Cornett, MD;  Location: [place];  Service: General;  Laterality: Left;  left breast duct excision   CARPAL TUNNEL RELEASE Right 01/01/2021   Procedure: CARPAL TUNNEL RELEASE;  Surgeon: [name], MD;  Location: [place];  Service: Orthopedics;  Laterality: Right;   CESAREAN SECTION  1980   times 2; 1980 and 1982  [place]   COLONOSCOPY  09/15/2018   EXCISION/RELEASE BURSA HIP Right 10/21/2014   Procedure: OPEN RIGHT HIP BURSECTOMY, EXOSECTOMY;  Surgeon: Paralee Cancel, MD;  Location: [facility];  Service: Orthopedics;  Laterality: Right;   foot surgery  2004, 2005, 2008   bilat secondary to neuropathy    JOINT REPLACEMENT  11/2010   lt total knee X2   KNEE ARTHROSCOPY  2007, 2008, 2009   LAPAROSCOPIC GASTRIC SLEEVE RESECTION N/A 10/30/2013   Procedure: LAPAROSCOPIC GASTRIC SLEEVE RESECTION WITH HIATAL HERNIA REPAIR AND UPPER ENDOSCOPY;  Surgeon: Greer Pickerel, MD;  Location: [facility];  Service: General;  Laterality: N/A;   NASAL SINUS SURGERY  2013   OPEN SURGICAL REPAIR OF GLUTEAL TENDON  01/17/2012   Procedure: OPEN SURGICAL REPAIR OF GLUTEAL TENDON;  Surgeon: Mauri Pole, MD;  Location:  [facility];  Service: Orthopedics;  Laterality: Right;   TOTAL KNEE ARTHROPLASTY Right 04/15/2020   Procedure: TOTAL KNEE ARTHROPLASTY;  Surgeon: Paralee Cancel, MD;  Location: [facility];  Service: Orthopedics;  Laterality: Right;  70 mins   ULNAR TUNNEL RELEASE Right 01/01/2021   Procedure: CUBITAL TUNNEL RELEASE;  Surgeon: [name], MD;  Location: [place];  Service: Orthopedics;  Laterality: Right;  with MAC anesthesia needs 45 minutes    Current Medications: Current Meds  Medication Sig   B-D TB SYRINGE 1CC/27GX1/2" 27G X 1/2" 1 ML MISC    Biotin 10 MG CAPS Take 10 mg by mouth daily.   EPINEPHrine (EPIPEN 2-PAK) 0.3 mg/0.3 mL IJ SOAJ injection Inject 0.3 mLs (0.3 mg total) into the muscle once.   folic acid (FOLVITE) 419 MCG tablet Take 1,600 mcg by mouth daily.   HYDROcodone-acetaminophen (NORCO/VICODIN) 5-325 MG tablet 1 tablet as needed   lisinopril (ZESTRIL) 10 MG tablet Take 1 tablet (10 mg total) by mouth daily.   methotrexate 50 MG/2ML injection Last dose 12/19/20 until after surgery (pt stated she was told by her surgeon to stop until after surgery on 01/01/21)   ondansetron (ZOFRAN) 4 MG tablet Take 1 tablet (4 mg total) by mouth every 8 (eight) hours as needed for nausea or vomiting.   predniSONE (DELTASONE) 10 MG tablet Take 10 mg by mouth daily with breakfast. 2 tabs every am 10 mg   rosuvastatin (CRESTOR) 20 MG tablet Take 1 tablet (20 mg total) by mouth daily.   sertraline (ZOLOFT) 100 MG tablet Take 1 tablet (100 mg total) by mouth daily.   zolpidem (AMBIEN) 10 MG tablet TAKE 1 TABLET BY MOUTH AT BEDTIME AS NEEDED FOR SLEEP     Allergies:   Bee venom, Keflex [cephalexin], Codeine sulfate, Penicillamine, and Penicillins   Social History   Socioeconomic History   Marital status: Married    Spouse name: Not on file   Number of children: 2   Years of education: Not on file   Highest education level: 12th grade  Occupational History   Occupation:  retired    Comment: Building services engineer  Tobacco Use   Smoking status: Former    Packs/day: 0.25    Years: 4.00    Total pack years: 1.00    Types: Cigarettes    Quit date: 02/23/1984    Years since quitting: 37.6   Smokeless tobacco: Never  Vaping Use   Vaping Use: Never used  Substance and Sexual Activity   Alcohol use: No   Drug use: No   Sexual activity: Not on file  Other  Topics Concern   Not on file  Social History Narrative   Lives at home with husband   Disabled   Right handed   Caffeine: 1 cup of coffee daily   Social Determinants of Health   Financial Resource Strain: Low Risk  (07/14/2021)   Overall Financial Resource Strain (CARDIA)    Difficulty of Paying Living Expenses: Not hard at all  Food Insecurity: No Food Insecurity (07/14/2021)   Hunger Vital Sign    Worried About Running Out of Food in the Last Year: Never true    Ran Out of Food in the Last Year: Never true  Transportation Needs: No Transportation Needs (07/14/2021)   PRAPARE - Hydrologist (Medical): No    Lack of Transportation (Non-Medical): No  Physical Activity: Insufficiently Active (07/14/2021)   Exercise Vital Sign    Days of Exercise per Week: 3 days    Minutes of Exercise per Session: 20 min  Stress: No Stress Concern Present (07/14/2021)   [place]    Feeling of Stress : Not at all  Social Connections: [place] (03/02/2021)   Social Connection and Isolation Panel [NHANES]    Frequency of Communication with Friends and Family: More than three times a week    Frequency of Social Gatherings with Friends and Family: More than three times a week    Attends Religious Services: More than 4 times per year    Active Member of [employer] or Organizations: Yes    Attends Music therapist: More than 4 times per year    Marital Status: Married     Family History: The patient's family  history includes Breast cancer (age of onset: 17) in her mother; Cancer in her mother; Diabetes in her son; Epilepsy in her father; Heart attack in her sister and sister; Kidney disease in her father.  ROS:   Please see the history of present illness.     All other systems reviewed and are negative.  EKGs/Labs/Other Studies Reviewed:    The following studies were reviewed today: Echo 02/08/20:    1. Left ventricular ejection fraction, by estimation, is 60 to 65%. The  left ventricle has normal function. The left ventricle has no regional  wall motion abnormalities. Left ventricular diastolic parameters were  normal.   2. Right ventricular systolic function is normal. The right ventricular  size is normal. There is mildly elevated pulmonary artery systolic  pressure. The estimated right ventricular systolic pressure is 74.0 mmHg.   3. The mitral valve is normal in structure. Mild mitral valve  regurgitation. No evidence of mitral stenosis.   4. The aortic valve is normal in structure. Aortic valve regurgitation is  not visualized. No aortic stenosis is present.   5. The inferior vena cava is normal in size with greater than 50%  respiratory variability, suggesting right atrial pressure of 3 mmHg.    Cardiac CT 01/28/20:   1. Coronary calcium score of 71. This was 84 percentile for age and sex matched control.   2. Normal coronary origin with right dominance.   3. There is focal calcified proximal LAD plaque that is 25-49% stenosis. Non flow limiting.   4. CAD-RADS 2. Mild non-obstructive CAD (25-49%). Consider non-atherosclerotic causes of chest pain. Consider preventive therapy and risk factor modification.   5.  Aortic atherosclerosis.     Recent Labs: 01/01/2021: BUN 18; Creatinine, Ser 0.50; Hemoglobin 13.9; Potassium 3.9; Sodium 142  Recent Lipid Panel    Component Value Date/Time   CHOL 169 06/02/2020 0905   TRIG 112.0 06/02/2020 0905   HDL 105.10 06/02/2020  0905   CHOLHDL 2 06/02/2020 0905   VLDL 22.4 06/02/2020 0905   LDLCALC 42 06/02/2020 0905   LDLCALC 113 (H) 12/05/2019 1021     Risk Assessment/Calculations:              Physical Exam:    VS:  BP 130/70 (BP Location: Left Arm, Patient Position: Sitting, Cuff Size: Normal)   Pulse (!) 57   Ht 5' 4.5" (1.638 m)   Wt 215 lb (97.5 kg)   SpO2 98%   BMI 36.33 kg/m     Wt Readings from Last 3 Encounters:  09/28/21 215 lb (97.5 kg)  07/14/21 222 lb 6.4 oz (100.9 kg)  06/10/21 219 lb 9.6 oz (99.6 kg)     GEN:  Well nourished, well developed in no acute distress HEENT: Normal NECK: No JVD; No carotid bruits LYMPHATICS: No lymphadenopathy CARDIAC: RRR, no murmurs, no rubs, gallops RESPIRATORY:  Clear to auscultation without rales, wheezing or rhonchi  ABDOMEN: Soft, non-tender, non-distended MUSCULOSKELETAL:  No edema; No deformity  SKIN: Warm and dry NEUROLOGIC:  Alert and oriented x 3 PSYCHIATRIC:  Normal affect   ASSESSMENT:    1. Coronary artery disease involving native coronary artery of native heart without angina pectoris   2. Pure hypercholesterolemia   3. Palpitations    PLAN:    In order of problems listed above:  Palpitations - Likely PVCs or PACs occurring when resting prior to bed.  If symptoms become more worrisome, we will have low threshold for ZIO monitor.  For now continue to watch at home.  Banana daily, magnesium supplement may help.  Coronary artery disease --LAD calcification is present and 25-49% stenosis. Non flow limiting. Aortic atherosclerosis is seen as well. --Crestor 70m PO QD started in December 2021.  --LDL goal less than 70.  42.  Excellent. -No anginal symptoms.  Doing well.   Aortic atherosclerosis -Statin, low-dose aspirin. Watch for any signs of bleeding. Overall doing well.   Knee osteoarthritis/rheumatoid arthritis -knee replacement, left and right. -Takes prednisone and methotrexate          Medication  Adjustments/Labs and Tests Ordered: Current medicines are reviewed at length with the patient today.  Concerns regarding medicines are outlined above.  No orders of the defined types were placed in this encounter.  No orders of the defined types were placed in this encounter.   Patient Instructions  Medication Instructions:  Your physician recommends that you continue on your current medications as directed. Please refer to the Current Medication list given to you today.  *If you need a refill on your cardiac medications before your next appointment, please call your pharmacy*   Lab Work: NONE If you have labs (blood work) drawn today and your tests are completely normal, you will receive your results only by: [place](if you have MyChart) OR A paper copy in the mail If you have any lab test that is abnormal or we need to change your treatment, we will call you to review the results.   Testing/Procedures: NONE   Follow-Up: At [facility] you and your health needs are our priority.  As part of our continuing mission to provide you with exceptional heart care, we have created designated Provider Care Teams.  These Care Teams include your primary Cardiologist (physician) and Advanced Practice Providers (APPs -  Physician Assistants and Nurse Practitioners) who all work together to provide you with the care you need, when you need it.  We recommend signing up for the patient portal called "MyChart".  Sign up information is provided on this After Visit Summary.  MyChart is used to connect with patients for Virtual Visits (Telemedicine).  Patients are able to view lab/test results, encounter notes, upcoming appointments, etc.  Non-urgent messages can be sent to your provider as well.   To learn more about what you can do with MyChart, go to [email or web address].    Your next appointment:   1 year(s)  The format for your next appointment:   In Person  Provider:   Candee Furbish, MD {   Important Information About Sugar         Signed, Candee Furbish, MD  09/28/2021 10:04 AM    [place]

## 2021-09-29 DIAGNOSIS — M0589 Other rheumatoid arthritis with rheumatoid factor of multiple sites: Secondary | ICD-10-CM | POA: Diagnosis not present

## 2021-10-13 DIAGNOSIS — M0589 Other rheumatoid arthritis with rheumatoid factor of multiple sites: Secondary | ICD-10-CM | POA: Diagnosis not present

## 2021-10-19 ENCOUNTER — Other Ambulatory Visit (HOSPITAL_COMMUNITY): Payer: Self-pay

## 2021-10-19 DIAGNOSIS — G5603 Carpal tunnel syndrome, bilateral upper limbs: Secondary | ICD-10-CM | POA: Diagnosis not present

## 2021-10-19 DIAGNOSIS — G894 Chronic pain syndrome: Secondary | ICD-10-CM | POA: Diagnosis not present

## 2021-10-19 DIAGNOSIS — M47812 Spondylosis without myelopathy or radiculopathy, cervical region: Secondary | ICD-10-CM | POA: Diagnosis not present

## 2021-10-19 DIAGNOSIS — K5903 Drug induced constipation: Secondary | ICD-10-CM | POA: Diagnosis not present

## 2021-10-19 DIAGNOSIS — Z79891 Long term (current) use of opiate analgesic: Secondary | ICD-10-CM | POA: Diagnosis not present

## 2021-10-19 MED ORDER — HYDROCODONE-ACETAMINOPHEN 5-325 MG PO TABS
ORAL_TABLET | ORAL | 0 refills | Status: DC
  Filled 2021-10-19: qty 60, 30d supply, fill #0

## 2021-10-27 DIAGNOSIS — Z79899 Other long term (current) drug therapy: Secondary | ICD-10-CM | POA: Diagnosis not present

## 2021-10-27 DIAGNOSIS — M0589 Other rheumatoid arthritis with rheumatoid factor of multiple sites: Secondary | ICD-10-CM | POA: Diagnosis not present

## 2021-11-04 DIAGNOSIS — M7542 Impingement syndrome of left shoulder: Secondary | ICD-10-CM | POA: Diagnosis not present

## 2021-11-04 DIAGNOSIS — M25512 Pain in left shoulder: Secondary | ICD-10-CM | POA: Diagnosis not present

## 2021-11-13 DIAGNOSIS — M25512 Pain in left shoulder: Secondary | ICD-10-CM | POA: Diagnosis not present

## 2021-11-17 ENCOUNTER — Other Ambulatory Visit (HOSPITAL_COMMUNITY): Payer: Self-pay

## 2021-11-17 DIAGNOSIS — G5603 Carpal tunnel syndrome, bilateral upper limbs: Secondary | ICD-10-CM | POA: Diagnosis not present

## 2021-11-17 DIAGNOSIS — K5903 Drug induced constipation: Secondary | ICD-10-CM | POA: Diagnosis not present

## 2021-11-17 DIAGNOSIS — Z23 Encounter for immunization: Secondary | ICD-10-CM | POA: Diagnosis not present

## 2021-11-17 DIAGNOSIS — G894 Chronic pain syndrome: Secondary | ICD-10-CM | POA: Diagnosis not present

## 2021-11-17 DIAGNOSIS — M47812 Spondylosis without myelopathy or radiculopathy, cervical region: Secondary | ICD-10-CM | POA: Diagnosis not present

## 2021-11-17 MED ORDER — OXYCODONE-ACETAMINOPHEN 5-325 MG PO TABS
1.0000 | ORAL_TABLET | Freq: Three times a day (TID) | ORAL | 0 refills | Status: DC
  Filled 2021-11-17: qty 90, 30d supply, fill #0

## 2021-11-24 DIAGNOSIS — M0589 Other rheumatoid arthritis with rheumatoid factor of multiple sites: Secondary | ICD-10-CM | POA: Diagnosis not present

## 2021-11-24 DIAGNOSIS — Z79899 Other long term (current) drug therapy: Secondary | ICD-10-CM | POA: Diagnosis not present

## 2021-11-25 DIAGNOSIS — M25812 Other specified joint disorders, left shoulder: Secondary | ICD-10-CM | POA: Diagnosis not present

## 2021-11-26 DIAGNOSIS — Z6837 Body mass index (BMI) 37.0-37.9, adult: Secondary | ICD-10-CM | POA: Diagnosis not present

## 2021-11-26 DIAGNOSIS — G5603 Carpal tunnel syndrome, bilateral upper limbs: Secondary | ICD-10-CM | POA: Diagnosis not present

## 2021-11-26 DIAGNOSIS — M25512 Pain in left shoulder: Secondary | ICD-10-CM | POA: Diagnosis not present

## 2021-11-26 DIAGNOSIS — M797 Fibromyalgia: Secondary | ICD-10-CM | POA: Diagnosis not present

## 2021-11-26 DIAGNOSIS — M0589 Other rheumatoid arthritis with rheumatoid factor of multiple sites: Secondary | ICD-10-CM | POA: Diagnosis not present

## 2021-11-26 DIAGNOSIS — E669 Obesity, unspecified: Secondary | ICD-10-CM | POA: Diagnosis not present

## 2021-11-26 DIAGNOSIS — M79641 Pain in right hand: Secondary | ICD-10-CM | POA: Diagnosis not present

## 2021-11-26 DIAGNOSIS — M1991 Primary osteoarthritis, unspecified site: Secondary | ICD-10-CM | POA: Diagnosis not present

## 2021-12-17 ENCOUNTER — Other Ambulatory Visit (HOSPITAL_COMMUNITY): Payer: Self-pay

## 2021-12-17 DIAGNOSIS — G5603 Carpal tunnel syndrome, bilateral upper limbs: Secondary | ICD-10-CM | POA: Diagnosis not present

## 2021-12-17 DIAGNOSIS — K5903 Drug induced constipation: Secondary | ICD-10-CM | POA: Diagnosis not present

## 2021-12-17 DIAGNOSIS — M47812 Spondylosis without myelopathy or radiculopathy, cervical region: Secondary | ICD-10-CM | POA: Diagnosis not present

## 2021-12-17 DIAGNOSIS — G894 Chronic pain syndrome: Secondary | ICD-10-CM | POA: Diagnosis not present

## 2021-12-17 MED ORDER — OXYCODONE-ACETAMINOPHEN 5-325 MG PO TABS
1.0000 | ORAL_TABLET | Freq: Three times a day (TID) | ORAL | 0 refills | Status: DC | PRN
  Filled 2022-01-15: qty 90, 30d supply, fill #0

## 2021-12-17 MED ORDER — OXYCODONE-ACETAMINOPHEN 5-325 MG PO TABS
1.0000 | ORAL_TABLET | Freq: Three times a day (TID) | ORAL | 0 refills | Status: DC | PRN
  Filled 2021-12-17: qty 90, 30d supply, fill #0

## 2021-12-18 ENCOUNTER — Telehealth: Payer: Self-pay | Admitting: Family Medicine

## 2021-12-18 NOTE — Telephone Encounter (Signed)
Requesting refill zolpidem (AMBIEN) 10 MG tablet

## 2021-12-20 MED ORDER — ZOLPIDEM TARTRATE 10 MG PO TABS: 10.0000 mg | ORAL_TABLET | Freq: Every evening | ORAL | 1 refills | Status: DC | PRN

## 2021-12-20 NOTE — Addendum Note (Signed)
Addended by: Eulas Post on: 12/20/2021 09:21 PM   Modules accepted: Orders

## 2021-12-22 DIAGNOSIS — M0589 Other rheumatoid arthritis with rheumatoid factor of multiple sites: Secondary | ICD-10-CM | POA: Diagnosis not present

## 2021-12-22 DIAGNOSIS — Z23 Encounter for immunization: Secondary | ICD-10-CM | POA: Diagnosis not present

## 2022-01-05 ENCOUNTER — Other Ambulatory Visit: Payer: Self-pay | Admitting: Family Medicine

## 2022-01-15 ENCOUNTER — Other Ambulatory Visit (HOSPITAL_COMMUNITY): Payer: Self-pay

## 2022-01-18 ENCOUNTER — Other Ambulatory Visit (HOSPITAL_COMMUNITY): Payer: Self-pay

## 2022-01-20 DIAGNOSIS — R5383 Other fatigue: Secondary | ICD-10-CM | POA: Diagnosis not present

## 2022-01-20 DIAGNOSIS — Z111 Encounter for screening for respiratory tuberculosis: Secondary | ICD-10-CM | POA: Diagnosis not present

## 2022-01-20 DIAGNOSIS — Z79899 Other long term (current) drug therapy: Secondary | ICD-10-CM | POA: Diagnosis not present

## 2022-01-20 DIAGNOSIS — M0589 Other rheumatoid arthritis with rheumatoid factor of multiple sites: Secondary | ICD-10-CM | POA: Diagnosis not present

## 2022-02-05 ENCOUNTER — Encounter (HOSPITAL_COMMUNITY): Admission: RE | Admit: 2022-02-05 | Payer: Medicare Other | Source: Ambulatory Visit

## 2022-02-08 NOTE — Patient Instructions (Signed)
DUE TO COVID-19 ONLY TWO VISITORS  (aged 66 and older)  ARE ALLOWED TO COME WITH YOU AND STAY IN THE WAITING ROOM ONLY DURING PRE OP AND PROCEDURE.   **NO VISITORS ARE ALLOWED IN THE SHORT STAY AREA OR RECOVERY ROOM!!**  IF YOU WILL BE ADMITTED INTO THE HOSPITAL YOU ARE ALLOWED ONLY FOUR SUPPORT PEOPLE DURING VISITATION HOURS ONLY (7 AM -8PM)   The support person(s) must pass our screening, gel in and out, and wear a mask at all times, including in the patient's room. Patients must also wear a mask when staff or their support person are in the room. Visitors GUEST BADGE MUST BE WORN VISIBLY  One adult visitor may remain with you overnight and MUST be in the room by 8 P.M.     Your procedure is scheduled on: `02/18/22   Report to [place] Entrance    Report to admitting at   5:15 AM   Call this number if you have problems the morning of surgery [phone]   Do not eat food :After Midnight.   After Midnight you may have the following liquids until _4:30_____ AM/  DAY OF SURGERY  Water Black Coffee (sugar ok, NO MILK/CREAM OR CREAMERS)  Tea (sugar ok, NO MILK/CREAM OR CREAMERS) regular and decaf                             Plain Jell-O (NO RED)                                           Fruit ices (not with fruit pulp, NO RED)                                     Popsicles (NO RED)                                                                  Juice: apple, WHITE grape, WHITE cranberry Sports drinks like Gatorade (NO RED)                  The day of surgery:  Drink ONE (1) Pre-Surgery Clear Ensure at 4:15 AM the morning of surgery. Drink in one sitting. Do not sip.  This drink was given to you during your hospital  pre-op appointment visit. Nothing else to drink after completing the  Pre-Surgery Clear Ensure AT 4:30 am          If you have questions, please contact your surgeon's office.    Oral Hygiene is also important to reduce your risk of infection.                                     Remember - BRUSH YOUR TEETH THE MORNING OF SURGERY WITH YOUR REGULAR TOOTHPASTE  DENTURES WILL BE REMOVED PRIOR TO SURGERY PLEASE DO NOT APPLY "Poly grip" OR ADHESIVES!!!    Take these medicines the morning of surgery with A  SIP OF WATER:                                                                                                                            Pain med if needed                                                                                                                           Sertraline-Zoloft                                                                                                                            Rosuvastatin-Crestor                                                                                                                            Prednisone                                You may not have any metal on your body including hair pins, jewelry, and body piercing             Do not wear make-up, lotions, powders, perfumes/cologne, or deodorant  Do not wear nail polish including gel and S&S, artificial/acrylic nails, or any other type of covering on natural nails including finger and toenails. If you have artificial nails, gel  coating, etc. that needs to be removed by a nail salon please have this removed prior to surgery or surgery may need to be canceled/ delayed if the surgeon/ anesthesia feels like they are unable to be safely monitored.   Do not shave  48 hours prior to surgery.     Do not bring valuables to the hospital. [place].   Contacts, glasses, or bridgework may not be worn into surgery.   Bring small overnight bag day of surgery.   DO NOT [place].     Patients discharged on the day of surgery will not be allowed to drive home.  Someone NEEDS to stay with you for the first 24 hours after anesthesia.   Special  Instructions: Bring a copy of your healthcare power of attorney and living will documents   the day of surgery if you haven't scanned them before.              Please read over the following fact sheets you were given: IF YOU HAVE QUESTIONS ABOUT YOUR PRE-OP INSTRUCTIONS PLEASE CALL [facility]- Preparing for Total Shoulder Arthroplasty    Before surgery, you can play an important role. Because skin is not sterile, your skin needs to be as free of germs as possible. You can reduce the number of germs on your skin by using the following products. Benzoyl Peroxide Gel Reduces the number of germs present on the skin Applied twice a day to shoulder area starting two days before surgery    ==================================================================  Please follow these instructions carefully:  BENZOYL PEROXIDE 5% GEL  Please do not use if you have an allergy to benzoyl peroxide.   If your skin becomes reddened/irritated stop using the benzoyl peroxide.  Starting two days before surgery, apply as follows: Apply benzoyl peroxide in the morning and at night. Apply after taking a shower. If you are not taking a shower clean entire shoulder front, back, and side along with the armpit with a clean wet washcloth.  Place a quarter-sized dollop on your shoulder and rub in thoroughly, making sure to cover the front, back, and side of your shoulder, along with the armpit.   2 days before ____ AM   ____ PM              1 day before ____ AM   ____ PM                         Do this twice a day for two days.  (Last application is the night before surgery, AFTER using the CHG soap as described below).  Do NOT apply benzoyl peroxide gel on the day of surgery.     [facility] - Preparing for Surgery Before surgery, you can play an important role.  Because skin is not sterile, your skin needs to be as free of germs as possible.  You can reduce the number of germs on your skin by washing  with CHG (chlorahexidine gluconate) soap before surgery.  CHG is an antiseptic cleaner which kills germs and bonds with the skin to continue killing germs even after washing. Please DO NOT use if you have an allergy to CHG or antibacterial soaps.  If your skin becomes reddened/irritated stop using the CHG and inform your nurse when you arrive at Short  Stay. Do not shave (including legs and underarms) for at least 48 hours prior to the first CHG shower.   Please follow these instructions carefully:  1.  Shower with CHG Soap the night before surgery and the  morning of Surgery.  2.  If you choose to wash your hair, wash your hair first as usual with your  normal  shampoo.  3.  After you shampoo, rinse your hair and body thoroughly to remove the  shampoo.                            4.  Use CHG as you would any other liquid soap.  You can apply chg directly  to the skin and wash                       Gently with a scrungie or clean washcloth.  5.  Apply the CHG Soap to your body ONLY FROM THE NECK DOWN.   Do not use on face/ open                           Wound or open sores. Avoid contact with eyes, ears mouth and genitals (private parts).                       Wash face,  Genitals (private parts) with your normal soap.             6.  Wash thoroughly, paying special attention to the area where your surgery  will be performed.  7.  Thoroughly rinse your body with warm water from the neck down.  8.  DO NOT shower/wash with your normal soap after using and rinsing off  the CHG Soap.             9.  Pat yourself dry with a clean towel.            10.  Wear clean pajamas.            11.  Place clean sheets on your bed the night of your first shower and do not  sleep with pets. Day of Surgery : Do not apply any lotions/deodorants the morning of surgery.  Please wear clean clothes to the hospital/surgery center.  FAILURE TO FOLLOW THESE INSTRUCTIONS MAY RESULT IN THE CANCELLATION OF YOUR  SURGERY    ________________________________________________________________________  Incentive Spirometer  An incentive spirometer is a tool that can help keep your lungs clear and active. This tool measures how well you are filling your lungs with each breath. Taking long deep breaths may help reverse or decrease the chance of developing breathing (pulmonary) problems (especially infection) following: A long period of time when you are unable to move or be active. BEFORE THE PROCEDURE  If the spirometer includes an indicator to show your best effort, your nurse or respiratory therapist will set it to a desired goal. If possible, sit up straight or lean slightly forward. Try not to slouch. Hold the incentive spirometer in an upright position. INSTRUCTIONS FOR USE  Sit on the edge of your bed if possible, or sit up as far as you can in bed or on a chair. Hold the incentive spirometer in an upright position. Breathe out normally. Place the mouthpiece in your mouth and seal your lips tightly around it. Breathe in slowly and as deeply as possible,  raising the piston or the ball toward the top of the column. Hold your breath for 3-5 seconds or for as long as possible. Allow the piston or ball to fall to the bottom of the column. Remove the mouthpiece from your mouth and breathe out normally. Rest for a few seconds and repeat Steps 1 through 7 at least 10 times every 1-2 hours when you are awake. Take your time and take a few normal breaths between deep breaths. The spirometer may include an indicator to show your best effort. Use the indicator as a goal to work toward during each repetition. After each set of 10 deep breaths, practice coughing to be sure your lungs are clear. If you have an incision (the cut made at the time of surgery), support your incision when coughing by placing a pillow or rolled up towels firmly against it. Once you are able to get out of bed, walk around indoors and cough  well. You may stop using the incentive spirometer when instructed by your caregiver.  RISKS AND COMPLICATIONS Take your time so you do not get dizzy or light-headed. If you are in pain, you may need to take or ask for pain medication before doing incentive spirometry. It is harder to take a deep breath if you are having pain. AFTER USE Rest and breathe slowly and easily. It can be helpful to keep track of a log of your progress. Your caregiver can provide you with a simple table to help with this. If you are using the spirometer at home, follow these instructions: [place] IF:  You are having difficultly using the spirometer. You have trouble using the spirometer as often as instructed. Your pain medication is not giving enough relief while using the spirometer. You develop fever of 100.5 F (38.1 C) or higher. SEEK IMMEDIATE MEDICAL CARE IF:  You cough up bloody sputum that had not been present before. You develop fever of 102 F (38.9 C) or greater. You develop worsening pain at or near the incision site. MAKE SURE YOU:  Understand these instructions. Will watch your condition. Will get help right away if you are not doing well or get worse. Document Released: 06/21/2006 Document Revised: 05/03/2011 Document Reviewed: 08/22/2006 [facility] Patient Information 2014 [place], [place].   ________________________________________________________________________

## 2022-02-10 ENCOUNTER — Other Ambulatory Visit (HOSPITAL_COMMUNITY): Payer: Self-pay

## 2022-02-10 DIAGNOSIS — G5603 Carpal tunnel syndrome, bilateral upper limbs: Secondary | ICD-10-CM | POA: Diagnosis not present

## 2022-02-10 DIAGNOSIS — M47812 Spondylosis without myelopathy or radiculopathy, cervical region: Secondary | ICD-10-CM | POA: Diagnosis not present

## 2022-02-10 DIAGNOSIS — G894 Chronic pain syndrome: Secondary | ICD-10-CM | POA: Diagnosis not present

## 2022-02-10 DIAGNOSIS — K5903 Drug induced constipation: Secondary | ICD-10-CM | POA: Diagnosis not present

## 2022-02-10 MED ORDER — OXYCODONE-ACETAMINOPHEN 5-325 MG PO TABS
1.0000 | ORAL_TABLET | ORAL | 0 refills | Status: DC | PRN
  Filled 2022-02-16: qty 180, 30d supply, fill #0

## 2022-02-10 MED ORDER — OXYCODONE-ACETAMINOPHEN 5-325 MG PO TABS: 1.0000 | ORAL_TABLET | Freq: Three times a day (TID) | ORAL | 0 refills | Status: DC | PRN

## 2022-02-11 ENCOUNTER — Other Ambulatory Visit: Payer: Self-pay | Admitting: Cardiology

## 2022-02-11 ENCOUNTER — Other Ambulatory Visit (HOSPITAL_COMMUNITY): Payer: Self-pay

## 2022-02-12 ENCOUNTER — Encounter (HOSPITAL_COMMUNITY)
Admission: RE | Admit: 2022-02-12 | Discharge: 2022-02-12 | Disposition: A | Discharge status: Home / Self Care | Payer: Medicare Other | Source: Ambulatory Visit | Attending: Orthopedic Surgery | Admitting: Orthopedic Surgery

## 2022-02-12 ENCOUNTER — Encounter (HOSPITAL_COMMUNITY): Payer: Self-pay

## 2022-02-12 ENCOUNTER — Other Ambulatory Visit: Payer: Self-pay

## 2022-02-12 ENCOUNTER — Other Ambulatory Visit (HOSPITAL_COMMUNITY): Payer: Self-pay

## 2022-02-12 DIAGNOSIS — R001 Bradycardia, unspecified: Secondary | ICD-10-CM | POA: Diagnosis not present

## 2022-02-12 DIAGNOSIS — Z01818 Encounter for other preprocedural examination: Secondary | ICD-10-CM | POA: Insufficient documentation

## 2022-02-12 DIAGNOSIS — I10 Essential (primary) hypertension: Secondary | ICD-10-CM | POA: Diagnosis not present

## 2022-02-12 LAB — SURGICAL PCR SCREEN
MRSA, PCR: NEGATIVE
Staphylococcus aureus: NEGATIVE

## 2022-02-12 LAB — BASIC METABOLIC PANEL
Anion gap: 6 (ref 5–15)
BUN: 15 mg/dL (ref 8–23)
CO2: 28 mmol/L (ref 22–32)
Calcium: 9 mg/dL (ref 8.9–10.3)
Chloride: 108 mmol/L (ref 98–111)
Creatinine, Ser: 0.54 mg/dL (ref 0.44–1.00)
GFR, Estimated: >60 mL/min (ref >60)
Glucose, Bld: 90 mg/dL (ref 70–99)
Potassium: 4.3 mmol/L (ref 3.5–5.1)
Sodium: 142 mmol/L (ref 135–145)

## 2022-02-12 LAB — CBC
HCT: 38 % (ref 36.0–46.0)
Hemoglobin: 11.6 g/dL — ABNORMAL LOW (ref 12.0–15.0)
MCH: 29.8 pg (ref 26.0–34.0)
MCHC: 30.5 g/dL (ref 30.0–36.0)
MCV: 97.7 fL (ref 80.0–100.0)
Platelets: 284 10*3/uL (ref 150–400)
RBC: 3.89 MIL/uL (ref 3.87–5.11)
RDW: 15.2 % (ref 11.5–15.5)
WBC: 3.5 10*3/uL — ABNORMAL LOW (ref 4.0–10.5)
nRBC: 0 % (ref 0.0–0.2)

## 2022-02-12 NOTE — Progress Notes (Signed)
Anesthesia note:  Bowel prep reminder:  no  PCP - Dr. Girtha Hake Cardiologist -Dr. Marlou Porch Other- Rheumatology- Dr. Marquis Buggy  Chest x-ray - no EKG - 02/12/22-chart Stress Test - no ECHO - 02/08/20-epic Cardiac Cath - no CABG-no Pacemaker/ICD device last checked:NA  Sleep Study - no CPAP -   Pt is pre diabetic-no CBG at PAT visit- Fasting Blood Sugar at home- Checks Blood Sugar _____  Blood Thinner:NA Blood Thinner Instructions: Aspirin Instructions: Last Dose:  Anesthesia review: Yes   Patient denies shortness of breath, fever, cough and chest pain at PAT appointment. Pt has no SOB with activities.   Patient verbalized understanding of instructions that were given to them at the PAT appointment. Patient was also instructed that they will need to review over the PAT instructions again at home before surgery.yes

## 2022-02-16 ENCOUNTER — Other Ambulatory Visit: Payer: Self-pay

## 2022-02-16 ENCOUNTER — Other Ambulatory Visit (HOSPITAL_COMMUNITY): Payer: Self-pay

## 2022-02-17 ENCOUNTER — Encounter (HOSPITAL_COMMUNITY): Payer: Self-pay | Admitting: Orthopedic Surgery

## 2022-02-17 NOTE — Anesthesia Preprocedure Evaluation (Signed)
Anesthesia Evaluation  Patient identified by MRN, date of birth, ID band Patient awake    Reviewed: Allergy & Precautions, NPO status , Patient's Chart, lab work & pertinent test results, reviewed documented beta blocker date and time   History of Anesthesia Complications (+) PONV and history of anesthetic complications  Airway Mallampati: II  TM Distance: >3 FB Neck ROM: Full    Dental no notable dental hx. (+) Teeth Intact   Pulmonary pneumonia, resolved, former smoker Hx/o VC paralysis   Pulmonary exam normal breath sounds clear to auscultation       Cardiovascular hypertension, Pt. on medications + CAD  Normal cardiovascular exam Rhythm:Regular Rate:Normal  Hx/o non obstructive CAD  EKG 02/12/22 SB, otherwise normal  Echo 02/08/20 1. Left ventricular ejection fraction, by estimation, is 60 to 65%. The  left ventricle has normal function. The left ventricle has no regional  wall motion abnormalities. Left ventricular diastolic parameters were  normal.   2. Right ventricular systolic function is normal. The right ventricular  size is normal. There is mildly elevated pulmonary artery systolic  pressure. The estimated right ventricular systolic pressure is 34.9 mmHg.   3. The mitral valve is normal in structure. Mild mitral valve  regurgitation. No evidence of mitral stenosis.   4. The aortic valve is normal in structure. Aortic valve regurgitation is  not visualized. No aortic stenosis is present.   5. The inferior vena cava is normal in size with greater than 50%  respiratory variability, suggesting right atrial pressure of 3 mmHg     Neuro/Psych  Headaches PSYCHIATRIC DISORDERS Anxiety Depression     Neuromuscular disease    GI/Hepatic Neg liver ROS, hiatal hernia,GERD  Medicated,,Hx/o gastric sleeve resection   Endo/Other  Obesity  Renal/GU negative Renal ROS  negative genitourinary   Musculoskeletal  (+)  Arthritis , Rheumatoid disorders,  Fibromyalgia -, narcotic dependentLeft shoulder rotator cuff arthropathy   Abdominal  (+) + obese  Peds  Hematology  (+) Blood dyscrasia, anemia   Anesthesia Other Findings   Reproductive/Obstetrics                             Anesthesia Physical Anesthesia Plan  ASA: 3  Anesthesia Plan: General   Post-op Pain Management: Regional block*, Minimal or no pain anticipated and Precedex   Induction: Intravenous and Cricoid pressure planned  PONV Risk Score and Plan: 4 or greater and Treatment may vary due to age or medical condition, Ondansetron and Dexamethasone  Airway Management Planned: Oral ETT  Additional Equipment: None  Intra-op Plan:   Post-operative Plan: Extubation in OR  Informed Consent: I have reviewed the patients History and Physical, chart, labs and discussed the procedure including the risks, benefits and alternatives for the proposed anesthesia with the patient or authorized representative who has indicated his/her understanding and acceptance.       Plan Discussed with:   Anesthesia Plan Comments:         Anesthesia Quick Evaluation

## 2022-02-18 ENCOUNTER — Ambulatory Visit (HOSPITAL_COMMUNITY): Payer: Medicare Other | Admitting: Physician Assistant

## 2022-02-18 ENCOUNTER — Ambulatory Visit (HOSPITAL_COMMUNITY)
Admission: RE | Admit: 2022-02-18 | Discharge: 2022-02-18 | Disposition: A | Discharge status: Home / Self Care | Payer: Medicare Other | Source: Ambulatory Visit | Attending: Orthopedic Surgery | Admitting: Orthopedic Surgery

## 2022-02-18 ENCOUNTER — Other Ambulatory Visit: Payer: Self-pay

## 2022-02-18 ENCOUNTER — Encounter (HOSPITAL_COMMUNITY): Payer: Self-pay | Admitting: Orthopedic Surgery

## 2022-02-18 ENCOUNTER — Ambulatory Visit (HOSPITAL_BASED_OUTPATIENT_CLINIC_OR_DEPARTMENT_OTHER): Payer: Medicare Other | Admitting: Certified Registered Nurse Anesthetist

## 2022-02-18 ENCOUNTER — Encounter (HOSPITAL_COMMUNITY)
Admission: RE | Disposition: A | Discharge status: Home / Self Care | Payer: Self-pay | Source: Ambulatory Visit | Attending: Orthopedic Surgery

## 2022-02-18 DIAGNOSIS — I10 Essential (primary) hypertension: Secondary | ICD-10-CM

## 2022-02-18 DIAGNOSIS — M75102 Unspecified rotator cuff tear or rupture of left shoulder, not specified as traumatic: Secondary | ICD-10-CM | POA: Insufficient documentation

## 2022-02-18 DIAGNOSIS — M12812 Other specific arthropathies, not elsewhere classified, left shoulder: Secondary | ICD-10-CM | POA: Diagnosis not present

## 2022-02-18 DIAGNOSIS — M19012 Primary osteoarthritis, left shoulder: Secondary | ICD-10-CM | POA: Diagnosis not present

## 2022-02-18 DIAGNOSIS — I2510 Atherosclerotic heart disease of native coronary artery without angina pectoris: Secondary | ICD-10-CM | POA: Diagnosis not present

## 2022-02-18 DIAGNOSIS — F419 Anxiety disorder, unspecified: Secondary | ICD-10-CM | POA: Diagnosis not present

## 2022-02-18 DIAGNOSIS — Z9884 Bariatric surgery status: Secondary | ICD-10-CM | POA: Diagnosis not present

## 2022-02-18 DIAGNOSIS — G8918 Other acute postprocedural pain: Secondary | ICD-10-CM | POA: Diagnosis not present

## 2022-02-18 DIAGNOSIS — K219 Gastro-esophageal reflux disease without esophagitis: Secondary | ICD-10-CM | POA: Insufficient documentation

## 2022-02-18 DIAGNOSIS — I340 Nonrheumatic mitral (valve) insufficiency: Secondary | ICD-10-CM | POA: Diagnosis not present

## 2022-02-18 DIAGNOSIS — G709 Myoneural disorder, unspecified: Secondary | ICD-10-CM | POA: Insufficient documentation

## 2022-02-18 DIAGNOSIS — E669 Obesity, unspecified: Secondary | ICD-10-CM | POA: Diagnosis not present

## 2022-02-18 DIAGNOSIS — M069 Rheumatoid arthritis, unspecified: Secondary | ICD-10-CM | POA: Diagnosis not present

## 2022-02-18 DIAGNOSIS — F32A Depression, unspecified: Secondary | ICD-10-CM | POA: Diagnosis not present

## 2022-02-18 DIAGNOSIS — Z79899 Other long term (current) drug therapy: Secondary | ICD-10-CM | POA: Insufficient documentation

## 2022-02-18 DIAGNOSIS — Z87891 Personal history of nicotine dependence: Secondary | ICD-10-CM | POA: Insufficient documentation

## 2022-02-18 DIAGNOSIS — Z6836 Body mass index (BMI) 36.0-36.9, adult: Secondary | ICD-10-CM | POA: Diagnosis not present

## 2022-02-18 DIAGNOSIS — M12811 Other specific arthropathies, not elsewhere classified, right shoulder: Secondary | ICD-10-CM | POA: Diagnosis not present

## 2022-02-18 DIAGNOSIS — M797 Fibromyalgia: Secondary | ICD-10-CM | POA: Diagnosis not present

## 2022-02-18 DIAGNOSIS — Z01818 Encounter for other preprocedural examination: Secondary | ICD-10-CM

## 2022-02-18 HISTORY — PX: REVERSE SHOULDER ARTHROPLASTY: SHX5054

## 2022-02-18 SURGERY — ARTHROPLASTY, SHOULDER, TOTAL, REVERSE
Anesthesia: General | Site: Shoulder | Laterality: Left

## 2022-02-18 MED ORDER — TRANEXAMIC ACID 1000 MG/10ML IV SOLN: 1000.0000 mg | INTRAVENOUS | Status: DC

## 2022-02-18 MED ORDER — DEXAMETHASONE SODIUM PHOSPHATE 10 MG/ML IJ SOLN
INTRAMUSCULAR | Status: DC | PRN
  Administered 2022-02-18: 10 mg via INTRAVENOUS

## 2022-02-18 MED ORDER — SUGAMMADEX SODIUM 200 MG/2ML IV SOLN
INTRAVENOUS | Status: DC | PRN
  Administered 2022-02-18: 200 mg via INTRAVENOUS

## 2022-02-18 MED ORDER — TRANEXAMIC ACID-NACL 1000-0.7 MG/100ML-% IV SOLN
1000.0000 mg | INTRAVENOUS | Status: AC
  Administered 2022-02-18: 1000 mg via INTRAVENOUS
  Filled 2022-02-18: qty 100

## 2022-02-18 MED ORDER — PHENYLEPHRINE HCL (PRESSORS) 10 MG/ML IV SOLN
INTRAVENOUS | Status: AC
  Filled 2022-02-18: qty 1

## 2022-02-18 MED ORDER — FENTANYL CITRATE (PF) 100 MCG/2ML IJ SOLN
INTRAMUSCULAR | Status: DC | PRN
  Administered 2022-02-18: 50 ug via INTRAVENOUS
  Administered 2022-02-18: 100 ug via INTRAVENOUS

## 2022-02-18 MED ORDER — ONDANSETRON HCL 4 MG/2ML IJ SOLN
INTRAMUSCULAR | Status: DC | PRN
  Administered 2022-02-18: 4 mg via INTRAVENOUS

## 2022-02-18 MED ORDER — VANCOMYCIN HCL IN DEXTROSE 1-5 GM/200ML-% IV SOLN
1000.0000 mg | INTRAVENOUS | Status: DC
  Filled 2022-02-18: qty 200

## 2022-02-18 MED ORDER — ORAL CARE MOUTH RINSE: 15.0000 mL | Freq: Once | OROMUCOSAL | Status: AC

## 2022-02-18 MED ORDER — AMISULPRIDE (ANTIEMETIC) 5 MG/2ML IV SOLN
INTRAVENOUS | Status: AC
  Filled 2022-02-18: qty 4

## 2022-02-18 MED ORDER — PROPOFOL 10 MG/ML IV BOLUS
INTRAVENOUS | Status: DC | PRN
  Administered 2022-02-18: 150 mg via INTRAVENOUS

## 2022-02-18 MED ORDER — HYDROMORPHONE HCL 1 MG/ML IJ SOLN
0.2500 mg | INTRAMUSCULAR | Status: DC | PRN
  Administered 2022-02-18 (×2): 0.5 mg via INTRAVENOUS

## 2022-02-18 MED ORDER — 0.9 % SODIUM CHLORIDE (POUR BTL) OPTIME
TOPICAL | Status: DC | PRN
  Administered 2022-02-18: 1000 mL

## 2022-02-18 MED ORDER — STERILE WATER FOR IRRIGATION IR SOLN
Status: DC | PRN
  Administered 2022-02-18: 2000 mL

## 2022-02-18 MED ORDER — CEFAZOLIN SODIUM-DEXTROSE 2-3 GM-%(50ML) IV SOLR
INTRAVENOUS | Status: DC | PRN
  Administered 2022-02-18: 2 g via INTRAVENOUS

## 2022-02-18 MED ORDER — ONDANSETRON HCL 4 MG/2ML IJ SOLN
INTRAMUSCULAR | Status: AC
  Filled 2022-02-18: qty 2

## 2022-02-18 MED ORDER — OXYCODONE HCL 5 MG PO TABS: 5.0000 mg | ORAL_TABLET | Freq: Once | ORAL | Status: DC | PRN

## 2022-02-18 MED ORDER — CYCLOBENZAPRINE HCL 10 MG PO TABS: 10.0000 mg | ORAL_TABLET | Freq: Three times a day (TID) | ORAL | 1 refills | Status: DC | PRN

## 2022-02-18 MED ORDER — LACTATED RINGERS IV SOLN: INTRAVENOUS | Status: DC

## 2022-02-18 MED ORDER — ROCURONIUM BROMIDE 10 MG/ML (PF) SYRINGE
PREFILLED_SYRINGE | INTRAVENOUS | Status: DC | PRN
  Administered 2022-02-18: 80 mg via INTRAVENOUS

## 2022-02-18 MED ORDER — LIDOCAINE 2% (20 MG/ML) 5 ML SYRINGE
INTRAMUSCULAR | Status: DC | PRN
  Administered 2022-02-18: 80 mg via INTRAVENOUS

## 2022-02-18 MED ORDER — PHENYLEPHRINE HCL-NACL 20-0.9 MG/250ML-% IV SOLN
INTRAVENOUS | Status: DC | PRN
  Administered 2022-02-18: 30 ug/min via INTRAVENOUS

## 2022-02-18 MED ORDER — ONDANSETRON HCL 4 MG/2ML IJ SOLN
4.0000 mg | Freq: Once | INTRAMUSCULAR | Status: AC | PRN
  Administered 2022-02-18: 4 mg via INTRAVENOUS

## 2022-02-18 MED ORDER — LACTATED RINGERS IV BOLUS: 250.0000 mL | Freq: Once | INTRAVENOUS | Status: DC

## 2022-02-18 MED ORDER — BUPIVACAINE HCL (PF) 0.5 % IJ SOLN
INTRAMUSCULAR | Status: DC | PRN
  Administered 2022-02-18: 20 mL via PERINEURAL

## 2022-02-18 MED ORDER — PHENYLEPHRINE 80 MCG/ML (10ML) SYRINGE FOR IV PUSH (FOR BLOOD PRESSURE SUPPORT)
PREFILLED_SYRINGE | INTRAVENOUS | Status: DC | PRN
  Administered 2022-02-18: 80 ug via INTRAVENOUS

## 2022-02-18 MED ORDER — FENTANYL CITRATE (PF) 250 MCG/5ML IJ SOLN
INTRAMUSCULAR | Status: AC
  Filled 2022-02-18: qty 5

## 2022-02-18 MED ORDER — CHLORHEXIDINE GLUCONATE 0.12 % MT SOLN
15.0000 mL | Freq: Once | OROMUCOSAL | Status: AC
  Administered 2022-02-18: 15 mL via OROMUCOSAL

## 2022-02-18 MED ORDER — HYDROMORPHONE HCL 2 MG PO TABS: 2.0000 mg | ORAL_TABLET | ORAL | 0 refills | Status: DC | PRN

## 2022-02-18 MED ORDER — HYDROMORPHONE HCL 1 MG/ML IJ SOLN
INTRAMUSCULAR | Status: AC
  Filled 2022-02-18: qty 1

## 2022-02-18 MED ORDER — VANCOMYCIN HCL 1000 MG IV SOLR
INTRAVENOUS | Status: DC | PRN
  Administered 2022-02-18: 1000 mg via TOPICAL

## 2022-02-18 MED ORDER — PROPOFOL 10 MG/ML IV BOLUS
INTRAVENOUS | Status: AC
  Filled 2022-02-18: qty 20

## 2022-02-18 MED ORDER — BUPIVACAINE LIPOSOME 1.3 % IJ SUSP
INTRAMUSCULAR | Status: AC
  Filled 2022-02-18: qty 20

## 2022-02-18 MED ORDER — LACTATED RINGERS IV BOLUS
500.0000 mL | Freq: Once | INTRAVENOUS | Status: AC
  Administered 2022-02-18: 500 mL via INTRAVENOUS

## 2022-02-18 MED ORDER — VANCOMYCIN HCL 1000 MG IV SOLR
INTRAVENOUS | Status: AC
  Filled 2022-02-18: qty 20

## 2022-02-18 MED ORDER — OXYCODONE HCL 5 MG/5ML PO SOLN: 5.0000 mg | Freq: Once | ORAL | Status: DC | PRN

## 2022-02-18 MED ORDER — MIDAZOLAM HCL 5 MG/5ML IJ SOLN
INTRAMUSCULAR | Status: DC | PRN
  Administered 2022-02-18: 2 mg via INTRAVENOUS

## 2022-02-18 MED ORDER — BUPIVACAINE LIPOSOME 1.3 % IJ SUSP
INTRAMUSCULAR | Status: DC | PRN
  Administered 2022-02-18: 10 mL via PERINEURAL

## 2022-02-18 MED ORDER — AMISULPRIDE (ANTIEMETIC) 5 MG/2ML IV SOLN
10.0000 mg | Freq: Once | INTRAVENOUS | Status: AC
  Administered 2022-02-18: 10 mg via INTRAVENOUS

## 2022-02-18 MED ORDER — ONDANSETRON HCL 4 MG PO TABS: 4.0000 mg | ORAL_TABLET | Freq: Three times a day (TID) | ORAL | 0 refills | Status: DC | PRN

## 2022-02-18 MED ORDER — MIDAZOLAM HCL 2 MG/2ML IJ SOLN
INTRAMUSCULAR | Status: AC
  Filled 2022-02-18: qty 2

## 2022-02-18 MED ORDER — EPHEDRINE SULFATE-NACL 50-0.9 MG/10ML-% IV SOSY
PREFILLED_SYRINGE | INTRAVENOUS | Status: DC | PRN
  Administered 2022-02-18: 10 mg via INTRAVENOUS

## 2022-02-18 SURGICAL SUPPLY — 71 items
ADH SKN CLS APL DERMABOND .7 (GAUZE/BANDAGES/DRESSINGS) ×1
AID PSTN UNV HD RSTRNT DISP (MISCELLANEOUS) ×1
BAG COUNTER SPONGE SURGICOUNT (BAG) IMPLANT
BAG SPEC THK2 15X12 ZIP CLS (MISCELLANEOUS) ×1
BAG SPNG CNTER NS LX DISP (BAG)
BAG ZIPLOCK 12X15 (MISCELLANEOUS) ×1 IMPLANT
BIT DRILL AR 3 (BIT) ×1
BIT DRILL AR 3 NS (BIT) IMPLANT
BLADE SAW SGTL 83.5X18.5 (BLADE) ×1 IMPLANT
BNDG CMPR 5X4 CHSV STRCH STRL (GAUZE/BANDAGES/DRESSINGS) ×1
BNDG COHESIVE 4X5 TAN STRL LF (GAUZE/BANDAGES/DRESSINGS) ×1 IMPLANT
BSPLAT GLND +2X24 MDLR (Joint) ×1 IMPLANT
CEMENT BONE DEPUY (Cement) IMPLANT
COOLER ICEMAN CLASSIC (MISCELLANEOUS) ×1 IMPLANT
COVER BACK TABLE 60X90IN (DRAPES) ×1 IMPLANT
COVER SURGICAL LIGHT HANDLE (MISCELLANEOUS) ×1 IMPLANT
CUP SUT UNIV REVERS 36 NEUTRAL (Cup) IMPLANT
DERMABOND ADVANCED .7 DNX12 (GAUZE/BANDAGES/DRESSINGS) ×1 IMPLANT
DRAPE ORTHO SPLIT 77X108 STRL (DRAPES) ×2
DRAPE SHEET LG 3/4 BI-LAMINATE (DRAPES) ×1 IMPLANT
DRAPE SURG 17X11 SM STRL (DRAPES) ×1 IMPLANT
DRAPE SURG ORHT 6 SPLT 77X108 (DRAPES) ×2 IMPLANT
DRAPE TOP 10253 STERILE (DRAPES) ×1 IMPLANT
DRAPE U-SHAPE 47X51 STRL (DRAPES) ×1 IMPLANT
DRESSING AQUACEL AG SP 3.5X6 (GAUZE/BANDAGES/DRESSINGS) ×1 IMPLANT
DRILL SURG AR 10 (DRILL) IMPLANT
DRSG AQUACEL AG ADV 3.5X 6 (GAUZE/BANDAGES/DRESSINGS) IMPLANT
DRSG AQUACEL AG ADV 3.5X10 (GAUZE/BANDAGES/DRESSINGS) IMPLANT
DRSG AQUACEL AG SP 3.5X6 (GAUZE/BANDAGES/DRESSINGS) ×1
DURAPREP 26ML APPLICATOR (WOUND CARE) ×1 IMPLANT
ELECT BLADE TIP CTD 4 INCH (ELECTRODE) ×1 IMPLANT
ELECT PENCIL ROCKER SW 15FT (MISCELLANEOUS) ×1 IMPLANT
ELECT REM PT RETURN 15FT ADLT (MISCELLANEOUS) ×1 IMPLANT
FACESHIELD WRAPAROUND (MASK) ×5 IMPLANT
FACESHIELD WRAPAROUND OR TEAM (MASK) ×5 IMPLANT
GLENOID UNI REV MOD 24 +2 LAT (Joint) IMPLANT
GLENOSPHERE 36 +4 LAT/24 (Joint) IMPLANT
GLOVE BIO SURGEON STRL SZ7.5 (GLOVE) ×1 IMPLANT
GLOVE BIO SURGEON STRL SZ8 (GLOVE) ×1 IMPLANT
GLOVE SS BIOGEL STRL SZ 7 (GLOVE) ×1 IMPLANT
GLOVE SS BIOGEL STRL SZ 7.5 (GLOVE) ×1 IMPLANT
GOWN STRL SURGICAL XL XLNG (GOWN DISPOSABLE) ×2 IMPLANT
INSERT HUMERAL UNI REVERS 36 3 (Insert) IMPLANT
KIT BASIN OR (CUSTOM PROCEDURE TRAY) ×1 IMPLANT
KIT TURNOVER KIT A (KITS) IMPLANT
MANIFOLD NEPTUNE II (INSTRUMENTS) ×1 IMPLANT
NDL TAPERED W/ NITINOL LOOP (MISCELLANEOUS) ×1 IMPLANT
NEEDLE TAPERED W/ NITINOL LOOP (MISCELLANEOUS) ×1 IMPLANT
NS IRRIG 1000ML POUR BTL (IV SOLUTION) ×1 IMPLANT
PACK SHOULDER (CUSTOM PROCEDURE TRAY) ×1 IMPLANT
PAD ARMBOARD 7.5X6 YLW CONV (MISCELLANEOUS) ×1 IMPLANT
PAD COLD SHLDR WRAP-ON (PAD) ×1 IMPLANT
PIN NITINOL TARGETER 2.8 (PIN) IMPLANT
PIN SET MODULAR GLENOID SYSTEM (PIN) IMPLANT
RESTRAINT HEAD UNIVERSAL NS (MISCELLANEOUS) ×1 IMPLANT
SCREW CENTRAL MODULAR 25 (Screw) IMPLANT
SCREW PERI LOCK 5.5X32 (Screw) IMPLANT
SCREW PERIPHERAL 5.5X20 LOCK (Screw) IMPLANT
SLING ARM FOAM STRAP LRG (SOFTGOODS) IMPLANT
SLING ARM FOAM STRAP MED (SOFTGOODS) IMPLANT
STEM HUM UNIV REV SZ11 (Stem) IMPLANT
SUT MNCRL AB 3-0 PS2 18 (SUTURE) ×1 IMPLANT
SUT MON AB 2-0 CT1 36 (SUTURE) ×1 IMPLANT
SUT VIC AB 1 CT1 36 (SUTURE) ×1 IMPLANT
SUTURE TAPE 1.3 40 TPR END (SUTURE) ×2 IMPLANT
SUTURETAPE 1.3 40 TPR END (SUTURE) ×2
TOWEL OR 17X26 10 PK STRL BLUE (TOWEL DISPOSABLE) ×1 IMPLANT
TOWEL OR NON WOVEN STRL DISP B (DISPOSABLE) ×1 IMPLANT
TUBE SUCTION HIGH CAP CLEAR NV (SUCTIONS) ×1 IMPLANT
TUBING CONNECTING 10 (TUBING) IMPLANT
WATER STERILE IRR 1000ML POUR (IV SOLUTION) ×2 IMPLANT

## 2022-02-18 NOTE — Op Note (Signed)
02/18/2022  9:18 AM  PATIENT:   [name]  66 y.o. female  PRE-OPERATIVE DIAGNOSIS:  Left shoulder rotator cuff tear arthropathy  POST-OPERATIVE DIAGNOSIS: Same  PROCEDURE: Left shoulder reverse arthroplasty utilizing a press-fit size 11 Arthrex stem with a neutral metaphysis, +3 constrained polyethylene insert, 36/+4 glenosphere and a small/+2 baseplate  SURGEON:  Darian Cansler, Metta Clines M.D.  ASSISTANTS: Jenetta Loges, PA-C  Jenetta Loges, PA-C was utilized as an Environmental consultant throughout this case, essential for help with positioning the patient, positioning extremity, tissue manipulation, implantation of the prosthesis, suture management, wound closure, and intraoperative decision-making.  ANESTHESIA:   General endotracheal and interscalene block with Exparel  EBL: 250 cc  SPECIMEN: None  Drains: None   PATIENT DISPOSITION:  PACU - hemodynamically stable.    PLAN OF CARE: Discharge to home after PACU  Brief history:  Patient is a 66 year old female well-known to our practice with a long history of progressive increasing left shoulder pain and associated restrictions in mobility related to severe rotator cuff tear arthropathy.  Due to her increasing functional mentations and failure to respond to prolonged attempts at conservative management, she is brought to the operating room at this time for planned left shoulder reverse arthroplasty.  Preoperatively, I counseled the patient regarding treatment options and risks versus benefits thereof.  Possible surgical complications were all reviewed including potential for bleeding, infection, neurovascular injury, persistent pain, loss of motion, anesthetic complication, failure of the implant, and possible need for additional surgery. They understand and accept and agrees with our planned procedure.   Procedure detail:  After undergoing routine preop evaluation the patient received prophylactic antibiotics and interscalene block with  Exparel was established in the holding area by the anesthesia department.  Subsequently placed spine on the operating table and underwent the smooth induction of a general endotracheal anesthesia.  Placed into the beachchair position properly padded and protected.  The left shoulder girdle region was sterilely prepped and draped in standard fashion.  Timeout was called.  A deltopectoral approach to the left shoulder is made an approximately 8 cm incision.  Skin flaps were elevated dissection carried deeply and the deltopectoral interval was then developed from proximal to distal with the vein taken laterally.  The conjoined tendon was mobilized and retracted medially and adhesions were divided beneath the deltoid.  The long head biceps tendon was then tenodesed at the upper border of the pectoralis major tendon with the proximal segment unroofed and excised.  The superior remnant of the rotator cuff was split from the apex of the bicipital groove to the base of the coracoid and the subscapularis was then separated from the lesser tuberosity using electrocautery the free margin was tagged with a pair of suture tape sutures.  Capsular attachments were then divided from the anterior and inferior margins of the humeral neck and the humeral head was then delivered through the wound.  An extra medullary guide was then used to align the proposed humeral head resection which we performed with an oscillating saw at approximately 20 degrees of retroversion.  We then identified [name] retained suture anchors within the humeral metaphysis which we removed with a rondure and the associated sutures removed as well.  A metal cap was then placed over the cut proximal humeral surface and we then exposed the glenoid with appropriate retractors.  A circumferential labral resection was then performed.  Guidepin directed into the center of the glenoid with glenoid prepared with the central followed by the peripheral reamer to  stable  [place] bony bed in preparation completed with a central drill and tapped for a 25 mm lag screw.  Our baseplate was then assembled and vancomycin powder applied to the threads of the lag screw the baseplate was then inserted with good purchase and fixation all the peripheral locking screws were then placed using standard technique with excellent fixation.  A 36/+4 glenosphere was then impacted onto the baseplate and the central locking screw was placed.  We returned our attention back to the proximal humerus with a canal was opened and we found an additional suture anchor within the metaphyseal region and confirmed that all were completely removed.  Canal was then opened by hand reaming and ultimately broaching up to a size 11 at 20 degrees of retroversion.  A neutral metaphyseal reaming guide was then used to prepare the metaphysis.  A trial implant was then placed showing excellent motion stability and soft tissue balance.  The trial was then removed.  The final implant was assembled.  The canal was irrigated cleaned and dried with vancomycin powder sprayed into the canal.  Our final implant was then seated and we did use some bone graft from the resected humeral head to feel metaphyseal defect where the previous suture anchors had been removed.  Final seating of the implant showed excellent fixation all much to our satisfaction.  Final reduction showed the best motion stability and soft tissue balance with a +3 poly-.  The trial was removed and final +3 constrained poly was then impacted onto the implant and final implant construct was reduced showing excellent motion stability and soft tissue balance.  The wound was then copiously irrigated.  Final hemostasis was obtained.  The balance of the vancomycin powder was then sprayed liberally throughout the deep soft tissue planes.  The subscapularis was then repaired back to the eyelets on the collar of the implant after confirming that it had good elasticity.  The  deltopectoral interval was reapproximated with a series of figure-of-eight number Vicryl sutures.  2-0 Monocryl used to close the subcu layer and intracuticular 3-0 Monocryl used to close the skin followed by Dermabond and Aquacel dressing.  The right arm was then placed into a sling.  The patient was awakened, extubated, and taken to the recovery room in stable condition.  Felicia Shutter MD   Contact # [phone]

## 2022-02-18 NOTE — H&P (Signed)
[name]    Chief Complaint: Left shoulder rotator cuff tear arthropathy HPI: The patient is a 66 y.o. female with chronic and progressively increasing left shoulder pain related to severe rotator cuff tear arthropathy.  Due to her increasing functional mentations and failure to respond to prolonged attempts at conservative management, she is brought to the operating room at this time for planned left shoulder reverse arthroplasty  Past Medical History:  Diagnosis Date   ANGIOMA 10/03/2008   REMOVED FROM RIGHT LOWER LEG-BENIGN   Anxiety    Aortic atherosclerosis (HCC)    Arthritis    RA   Bronchitis    hx of    Cancer ([place])    BASAL CELL SKIN CANCER   Complication of anesthesia    Coronary artery disease    COVID-19 06/18/2020   HA, chills, myalgias, congestion & fever, all symptoms resolved as of 12/29/20 per patient   Depression    Fibromyalgia    GERD 08/27/2008   History of blood transfusion    1995   History of hiatal hernia    hx of has had surgically repaired   History of measles    History of mumps    History of nonmelanoma skin cancer    HYPERLIPIDEMIA 08/27/2008   HYPERTENSION 08/27/2008   pt is currently on no meds    Pneumonia    hx of    PONV (postoperative nausea and vomiting)    Rheumatoid arthritis ([place])    SEBORRHEIC KERATOSIS 08/27/2008   Sinus headache    Vocal cord paresis    hx of    Wears glasses    Wears partial dentures       Past Surgical History:  Procedure Laterality Date   ABDOMINAL HYSTERECTOMY  1995   with appendectomy   BREAST BIOPSY Left 2013   [place]  08/16/2011   Procedure: EXCISION DUCTAL SYSTEM BREAST;  Surgeon: Marcello Moores A. Cornett, MD;  Location: [facility];  Service: General;  Laterality: Left;  left breast duct excision   CARPAL TUNNEL RELEASE Right 01/01/2021   Procedure: CARPAL TUNNEL RELEASE;  Surgeon: [name], MD;  Location: [facility];  Service:  Orthopedics;  Laterality: Right;   CESAREAN SECTION  1980   times 2; 1980 and [place]   COLONOSCOPY  09/15/2018   EXCISION/RELEASE BURSA HIP Right 10/21/2014   Procedure: OPEN RIGHT HIP BURSECTOMY, EXOSECTOMY;  Surgeon: Paralee Cancel, MD;  Location: [facility];  Service: Orthopedics;  Laterality: Right;   foot surgery  2004, 2005, 2008   bilat secondary to neuropathy    JOINT REPLACEMENT  11/2010   lt total knee X2   KNEE ARTHROSCOPY  2007, 2008, 2009   LAPAROSCOPIC GASTRIC SLEEVE RESECTION N/A 10/30/2013   Procedure: LAPAROSCOPIC GASTRIC SLEEVE RESECTION WITH HIATAL HERNIA REPAIR AND UPPER ENDOSCOPY;  Surgeon: Greer Pickerel, MD;  Location: [facility];  Service: General;  Laterality: N/A;   NASAL SINUS SURGERY  2013   OPEN SURGICAL REPAIR OF GLUTEAL TENDON  01/17/2012   Procedure: OPEN SURGICAL REPAIR OF GLUTEAL TENDON;  Surgeon: Mauri Pole, MD;  Location: [facility];  Service: Orthopedics;  Laterality: Right;   ROTATOR CUFF REPAIR Bilateral 2020   2021   TOTAL KNEE ARTHROPLASTY Right 04/15/2020   Procedure: TOTAL KNEE ARTHROPLASTY;  Surgeon: Paralee Cancel, MD;  Location: [facility];  Service: Orthopedics;  Laterality: Right;  70 mins   ULNAR  TUNNEL RELEASE Right 01/01/2021   Procedure: CUBITAL TUNNEL RELEASE;  Surgeon: [name], MD;  [facility];  Service: Orthopedics;  Laterality: Right;  with MAC anesthesia needs 45 minutes    Family History  Problem Relation Age of Onset   Cancer Mother        breast   Breast cancer Mother 50   Kidney disease Father    Epilepsy Father    Heart attack Sister    Heart attack Sister    Diabetes Son     Social History:  reports that she quit smoking about 38 years ago. Her smoking use included cigarettes. She has a 1.00 pack-year smoking history. She has never used smokeless tobacco. She reports that she does not drink alcohol and does not use drugs.  BMI: Estimated body mass index  is 36.39 kg/m as calculated from the following:   Height as of this encounter: _0  (1.626 m).   Weight as of this encounter: 96.2 kg.  Lab Results  Component Value Date   ALBUMIN 4.2 06/02/2020   Diabetes: Patient does not have a diagnosis of diabetes.     Smoking Status:       Medications Prior to Admission  Medication Sig Dispense Refill   abatacept (ORENCIA) 250 MG injection Inject 750 mg into the vein every 30 (thirty) days.     B-D TB SYRINGE 1CC/27GX1/2" 27G X 1/2" 1 ML MISC      Biotin 10 MG CAPS Take 10 mg by mouth daily.     folic acid (FOLVITE) 283 MCG tablet Take 1,600 mcg by mouth daily.     lisinopril (ZESTRIL) 10 MG tablet Take 1 tablet (10 mg total) by mouth daily. 90 tablet 3   Methotrexate 25 MG/ML SOSY Inject 25 mg into the skin every 7 (seven) days.     oxyCODONE-acetaminophen (PERCOCET) 5-325 MG tablet Take 1 tablet by mouth 3 (three) times daily as needed for pain. 90 tablet 0   oxyCODONE-acetaminophen (PERCOCET) 5-325 MG tablet Take 1 tablet by mouth 3 (three) times daily as needed for pain. 90 tablet 0   polyethylene glycol (MIRALAX / GLYCOLAX) 17 g packet Take 17 g by mouth daily.     predniSONE (DELTASONE) 10 MG tablet Take 10 mg by mouth daily with breakfast.     rosuvastatin (CRESTOR) 20 MG tablet TAKE 1 TABLET DAILY 90 tablet 2   sertraline (ZOLOFT) 100 MG tablet TAKE 1 TABLET DAILY 90 tablet 1   zolpidem (AMBIEN) 10 MG tablet Take 1 tablet (10 mg total) by mouth at bedtime as needed. for sleep 90 tablet 1   EPINEPHrine (EPIPEN 2-PAK) 0.3 mg/0.3 mL IJ SOAJ injection Inject 0.3 mLs (0.3 mg total) into the muscle once. 2 each 1   HYDROcodone-acetaminophen (NORCO/VICODIN) 5-325 MG tablet Take 1 tablet by mouth twice a day as needed (Patient not taking: Reported on 02/11/2022) 60 tablet 0   ondansetron (ZOFRAN) 4 MG tablet Take 1 tablet (4 mg total) by mouth every 8 (eight) hours as needed for nausea or vomiting. 20 tablet 0   oxyCODONE-acetaminophen  (PERCOCET) 5-325 MG tablet Take 1 tablet by mouth three times a day as needed for pain.  Stop hydrocodone/apap. (Patient not taking: Reported on 02/11/2022) 90 tablet 0   [START ON 03/13/2022] oxyCODONE-acetaminophen (PERCOCET) 5-325 MG tablet Take 1 tablet by mouth 3 (three) times daily as needed for pain. Stop Hydrocodone/APAP 90 tablet 0   oxyCODONE-acetaminophen (PERCOCET) 5-325 MG tablet Take 1 tablet by  mouth every 4 (four) hours as needed for pain, stop Hydrocodone/APAP 180 tablet 0     Physical Exam: Left shoulder demonstrates painful and guarded motion as noted at recent office visits.  Strength is globally decreased.  Severe pain with attempted overhead elevation.  Otherwise neurovascular intact in the left upper extremity.  Examination otherwise as noted at the recent office visits.  Radiographs  Imaging studies confirmed changes consistent with chronic left shoulder rotator cuff tear arthropathy.  Vitals  Temp:  [97.9 F (36.6 C)] 97.9 F (36.6 C) (12/28 0542) Pulse Rate:  [67] 67 (12/28 0542) Resp:  [17] 17 (12/28 0542) SpO2:  [99 %] 99 % (12/28 0542) Weight:  [96.2 kg] 96.2 kg (12/28 0618)  Assessment/Plan  Impression: Left shoulder rotator cuff tear arthropathy  Plan of Action: Procedure(s): REVERSE SHOULDER ARTHROPLASTY  Almond Fitzgibbon M Earma Nicolaou 02/18/2022, 6:35 AM Contact # [phone]

## 2022-02-18 NOTE — Discharge Instructions (Signed)

## 2022-02-18 NOTE — Anesthesia Procedure Notes (Signed)
Anesthesia Regional Block: Interscalene brachial plexus block   Pre-Anesthetic Checklist: , timeout performed,  Correct Patient, Correct Site, Correct Laterality,  Correct Procedure, Correct Position, site marked,  Risks and benefits discussed,  Surgical consent,  Pre-op evaluation,  At surgeon's request and post-op pain management  Laterality: Left  Prep: chloraprep       Needles:  Injection technique: Single-shot  Needle Type: Echogenic Stimulator Needle     Needle Length: 10cm    Needle insertion depth: 7 cm   Additional Needles:   Procedures:,,,, ultrasound used (permanent image in chart),,   Motor weakness within 10 minutes.  Narrative:  Start time: 02/18/2022 6:59 AM End time: 02/18/2022 7:04 AM Injection made incrementally with aspirations every 5 mL.  Performed by: Personally  Anesthesiologist: Josephine Igo, MD  Additional Notes: .mfb

## 2022-02-18 NOTE — Transfer of Care (Signed)
Immediate Anesthesia Transfer of Care Note  Patient: [name]  Procedure(s) Performed: REVERSE SHOULDER ARTHROPLASTY (Left: Shoulder)  Patient Location: PACU  Anesthesia Type:General  Level of Consciousness: awake, alert , and oriented  Airway & Oxygen Therapy: Patient Spontanous Breathing and Patient connected to face mask oxygen  Post-op Assessment: Report given to RN and Post -op Vital signs reviewed and stable  Post vital signs: Reviewed and stable  Last Vitals:  Vitals Value Taken Time  BP 163/92 02/18/22 0920  Temp    Pulse 69 02/18/22 0921  Resp 15 02/18/22 0921  SpO2 100 % 02/18/22 0921  Vitals shown include unvalidated device data.  Last Pain:  Vitals:   02/18/22 0618  TempSrc:   PainSc: 3       Patients Stated Pain Goal: 3 (49/20/10 0712)  Complications: No notable events documented.

## 2022-02-18 NOTE — Anesthesia Postprocedure Evaluation (Signed)
Anesthesia Post Note  Patient: DHANI IMEL  Procedure(s) Performed: REVERSE SHOULDER ARTHROPLASTY (Left: Shoulder)     Patient location during evaluation: PACU Anesthesia Type: General Level of consciousness: awake and alert and oriented Pain management: pain level controlled Vital Signs Assessment: post-procedure vital signs reviewed and stable Respiratory status: spontaneous breathing, nonlabored ventilation and respiratory function stable Cardiovascular status: blood pressure returned to baseline and stable Postop Assessment: no apparent nausea or vomiting Anesthetic complications: no   No notable events documented.  Last Vitals:  Vitals:   02/18/22 1045 02/18/22 1100  BP: (!) 155/66 (!) 168/67  Pulse: (!) 56 (!) 58  Resp: 10 20  Temp:  36.4 C  SpO2: 94% 99%    Last Pain:  Vitals:   02/18/22 1100  TempSrc: Oral  PainSc: 0-No pain                 Jarrett Albor A.

## 2022-02-18 NOTE — Evaluation (Signed)
Occupational Therapy Evaluation Patient Details Name: RHEALYNN MYHRE MRN: [id number] DOB: 12-20-1955 Today's Date: 02/18/2022   History of Present Illness  (Mrs. Steeves is a 66 yr old female who is s/p a L shoulder reverse arthroplasty on 02-18-2022, due to L shoulder rotator cuff tear arthropathy.)   Clinical Impression   Patient is s/p shoulder replacement without functional use of left non-dominant upper extremity secondary to effects of surgery, interscalene bloc, and shoulder precautions. Therapist provided education and instruction to patient and spouse in regards to ROM/exercises, post-op precautions, UE positioning, donning upper extremity clothing, recommendations for bathing while maintaining shoulder precautions, use of ice for pain and edema management, and correctly donning/doffing sling. Patient and spouse verbalized and demonstrated understanding as needed. Patient needed assistance to donn shirt, pants, socks and shoes with instruction provided on compensatory strategies to perform ADLs. Patient to follow up with MD for further therapy needs.        Recommendations for follow up therapy are one component of a multi-disciplinary discharge planning process, led by the attending physician.  Recommendations may be updated based on patient status, additional functional criteria and insurance authorization.   Follow Up Recommendations  Follow physician's recommendations for discharge plan and follow up therapies     Assistance Recommended at Discharge Intermittent Supervision/Assistance  Patient can return home with the following Help with stairs or ramp for entrance;A little help with bathing/dressing/bathroom;Assistance with cooking/housework;Assist for transportation    Functional Status Assessment  Patient has had a recent decline in their functional status and demonstrates the ability to make significant improvements in function in a reasonable and predictable amount of  time.  Equipment Recommendations  None recommended by OT       Precautions / Restrictions Precautions Precautions: Shoulder Precaution Booklet Issued: Yes (comment) Precaution Comments: If sitting in controlled environment, ok to come out of sling to give neck a break. Please sleep in it to protect until follow up in office. OK to use operative arm for feeding, hygiene and ADLs.   Ok to instruct Pendulums and lap slides as exercises. Ok to use operative arm within the following parameters for ADL purposes: Ok for PROM, AAROM, AROM within pain tolerance and within the following ROM   ER 20   ABD 45   FE 60. LUE NWB, Okay to perform elbow, wrist, and hand AROM Restrictions Weight Bearing Restrictions: Yes LUE Weight Bearing: Non weight bearing      Mobility Bed Mobility Overal bed mobility: Needs Assistance             General bed mobility comments: Pt was received seated in bedside chair    Transfers Overall transfer level: Needs assistance   Transfers: Sit to/from Stand Sit to Stand: Supervision                  Balance Overall balance assessment: No apparent balance deficits (not formally assessed)                 ADL either performed or assessed with clinical judgement      Pertinent Vitals/Pain Pain Assessment Pain Assessment: 0-10 Pain Score: 3  Pain Location: LUE Pain Intervention(s): Limited activity within patient's tolerance     Hand Dominance Right      Communication Communication Communication: No difficulties   Cognition Arousal/Alertness: Awake/alert Behavior During Therapy: WFL for tasks assessed/performed Overall Cognitive Status: Within Functional Limits for tasks assessed            General Comments: Oriented  x4, able to follow commands without difficulty           Shoulder Instructions Shoulder Instructions Donning/doffing shirt without moving shoulder: Minimal assistance Method for sponge bathing under operated UE:  Caregiver independent with task Donning/doffing sling/immobilizer: Minimal assistance (caregiver performing) Correct positioning of sling/immobilizer: Caregiver independent with task Pendulum exercises (written home exercise program): Caregiver independent with task ROM for elbow, wrist and digits of operated UE: Caregiver independent with task Sling wearing schedule (on at all times/off for ADL's): Caregiver independent with task    Home Living Family/patient expects to be discharged to:: Private residence Living Arrangements: Spouse/significant other Available Help at Discharge: Family Type of Home: House Home Access: Level entry     Home Layout: One level     Bathroom Shower/Tub: Walk-in shower      Prior Functioning/Environment Prior Level of Function : Independent/Modified Independent             Mobility Comments: Independent with ambulation ADLs Comments: Independent with ADLs, cooking, cleaning, and driving.        OT Problem List: Impaired UE functional use      OT Treatment/Interventions:   No further treatment needs identified       OT Frequency:  N/A       AM-PAC OT "6 Clicks" Daily Activity     Outcome Measure Help from another person eating meals?: None Help from another person taking care of personal grooming?: None Help from another person toileting, which includes using toliet, bedpan, or urinal?: A Little Help from another person bathing (including washing, rinsing, drying)?: A Little Help from another person to put on and taking off regular upper body clothing?: A Little Help from another person to put on and taking off regular lower body clothing?: A Little 6 Click Score: 20   End of Session Nurse Communication: Other (comment) (shoulder instruction completed)  Activity Tolerance: Other (comment) (limited by nausea and feeling hot) Patient left: in chair;with call bell/phone within reach;with family/visitor present  OT Visit Diagnosis:  Muscle weakness (generalized) (M62.81)                Time: 7681-1572 OT Time Calculation (min): 35 min Charges:  OT General Charges $OT Visit: 1 Visit OT Evaluation $OT Eval Low Complexity: 1 Low OT Treatments $Self Care/Home Management : 8-22 mins    Leota Sauers, OTR/L 02/18/2022, 3:52 PM

## 2022-02-18 NOTE — Anesthesia Procedure Notes (Signed)
Procedure Name: Intubation Date/Time: 02/18/2022 7:42 AM  Performed by: Gean Maidens, CRNAPre-anesthesia Checklist: Patient identified, Emergency Drugs available, Suction available, Patient being monitored and Timeout performed Patient Re-evaluated:Patient Re-evaluated prior to induction Oxygen Delivery Method: Circle system utilized Preoxygenation: Pre-oxygenation with 100% oxygen Induction Type: IV induction Ventilation: Mask ventilation without difficulty Laryngoscope Size: Mac and 3 Grade View: Grade I Tube type: Oral Tube size: 7.0 mm Number of attempts: 1 Airway Equipment and Method: Stylet Placement Confirmation: ETT inserted through vocal cords under direct vision, positive ETCO2 and breath sounds checked- equal and bilateral Secured at: 21 cm Tube secured with: Tape Dental Injury: Teeth and Oropharynx as per pre-operative assessment

## 2022-02-23 ENCOUNTER — Emergency Department (HOSPITAL_COMMUNITY): Payer: Medicare Other

## 2022-02-23 ENCOUNTER — Encounter (HOSPITAL_COMMUNITY): Payer: Self-pay

## 2022-02-23 ENCOUNTER — Emergency Department (HOSPITAL_COMMUNITY)
Admission: EM | Admit: 2022-02-23 | Discharge: 2022-02-23 | Disposition: A | Discharge status: Home / Self Care | Payer: Medicare Other | Attending: Emergency Medicine | Admitting: Emergency Medicine

## 2022-02-23 ENCOUNTER — Other Ambulatory Visit: Payer: Self-pay

## 2022-02-23 DIAGNOSIS — R918 Other nonspecific abnormal finding of lung field: Secondary | ICD-10-CM | POA: Diagnosis not present

## 2022-02-23 DIAGNOSIS — R9389 Abnormal findings on diagnostic imaging of other specified body structures: Secondary | ICD-10-CM

## 2022-02-23 DIAGNOSIS — R0781 Pleurodynia: Secondary | ICD-10-CM | POA: Diagnosis not present

## 2022-02-23 DIAGNOSIS — J90 Pleural effusion, not elsewhere classified: Secondary | ICD-10-CM | POA: Diagnosis not present

## 2022-02-23 DIAGNOSIS — J9811 Atelectasis: Secondary | ICD-10-CM | POA: Diagnosis not present

## 2022-02-23 DIAGNOSIS — R000 Tachycardia, unspecified: Secondary | ICD-10-CM | POA: Diagnosis not present

## 2022-02-23 DIAGNOSIS — R0602 Shortness of breath: Secondary | ICD-10-CM | POA: Diagnosis not present

## 2022-02-23 LAB — CBC WITH DIFFERENTIAL/PLATELET
Abs Immature Granulocytes: 0.04 10*3/uL (ref 0.00–0.07)
Basophils Absolute: 0 10*3/uL (ref 0.0–0.1)
Basophils Relative: 0 %
Eosinophils Absolute: 0.1 10*3/uL (ref 0.0–0.5)
Eosinophils Relative: 1 %
HCT: 35.1 % — ABNORMAL LOW (ref 36.0–46.0)
Hemoglobin: 11.1 g/dL — ABNORMAL LOW (ref 12.0–15.0)
Immature Granulocytes: 1 %
Lymphocytes Relative: 7 %
Lymphs Abs: 0.4 10*3/uL — ABNORMAL LOW (ref 0.7–4.0)
MCH: 29.8 pg (ref 26.0–34.0)
MCHC: 31.6 g/dL (ref 30.0–36.0)
MCV: 94.1 fL (ref 80.0–100.0)
Monocytes Absolute: 0.8 10*3/uL (ref 0.1–1.0)
Monocytes Relative: 14 %
Neutro Abs: 4.4 10*3/uL (ref 1.7–7.7)
Neutrophils Relative %: 77 %
Platelets: 314 10*3/uL (ref 150–400)
RBC: 3.73 MIL/uL — ABNORMAL LOW (ref 3.87–5.11)
RDW: 14.3 % (ref 11.5–15.5)
WBC: 5.8 10*3/uL (ref 4.0–10.5)
nRBC: 0 % (ref 0.0–0.2)

## 2022-02-23 LAB — I-STAT CHEM 8, ED
BUN: 13 mg/dL (ref 8–23)
Calcium, Ion: 1.14 mmol/L — ABNORMAL LOW (ref 1.15–1.40)
Chloride: 96 mmol/L — ABNORMAL LOW (ref 98–111)
Creatinine, Ser: 0.5 mg/dL (ref 0.44–1.00)
Glucose, Bld: 99 mg/dL (ref 70–99)
HCT: 35 % — ABNORMAL LOW (ref 36.0–46.0)
Hemoglobin: 11.9 g/dL — ABNORMAL LOW (ref 12.0–15.0)
Potassium: 3.7 mmol/L (ref 3.5–5.1)
Sodium: 132 mmol/L — ABNORMAL LOW (ref 135–145)
TCO2: 27 mmol/L (ref 22–32)

## 2022-02-23 LAB — BASIC METABOLIC PANEL
Anion gap: 11 (ref 5–15)
BUN: 15 mg/dL (ref 8–23)
CO2: 27 mmol/L (ref 22–32)
Calcium: 8.4 mg/dL — ABNORMAL LOW (ref 8.9–10.3)
Chloride: 94 mmol/L — ABNORMAL LOW (ref 98–111)
Creatinine, Ser: 0.59 mg/dL (ref 0.44–1.00)
GFR, Estimated: >60 mL/min (ref >60)
Glucose, Bld: 104 mg/dL — ABNORMAL HIGH (ref 70–99)
Potassium: 3.7 mmol/L (ref 3.5–5.1)
Sodium: 132 mmol/L — ABNORMAL LOW (ref 135–145)

## 2022-02-23 LAB — TROPONIN I (HIGH SENSITIVITY): Troponin I (High Sensitivity): 3 ng/L (ref <18)

## 2022-02-23 LAB — BRAIN NATRIURETIC PEPTIDE: B Natriuretic Peptide: 12.2 pg/mL (ref 0.0–100.0)

## 2022-02-23 MED ORDER — MORPHINE SULFATE (PF) 4 MG/ML IV SOLN
4.0000 mg | Freq: Once | INTRAVENOUS | Status: AC
  Administered 2022-02-23: 4 mg via INTRAVENOUS
  Filled 2022-02-23: qty 1

## 2022-02-23 MED ORDER — IOHEXOL 350 MG/ML SOLN
100.0000 mL | Freq: Once | INTRAVENOUS | Status: AC | PRN
  Administered 2022-02-23: 100 mL via INTRAVENOUS

## 2022-02-23 MED ORDER — MORPHINE SULFATE (PF) 4 MG/ML IV SOLN: 4.0000 mg | INTRAVENOUS | Status: DC | PRN

## 2022-02-23 MED ORDER — MORPHINE SULFATE (PF) 4 MG/ML IV SOLN
4.0000 mg | Freq: Once | INTRAVENOUS | Status: AC
  Administered 2022-02-23: 4 mg via INTRAMUSCULAR
  Filled 2022-02-23: qty 1

## 2022-02-23 NOTE — ED Notes (Signed)
Pt in rm 8 for doctor to see, then returning to lobby

## 2022-02-23 NOTE — ED Notes (Signed)
Waiting on Labs and IV Access (20g or larger above wrist.

## 2022-02-23 NOTE — Progress Notes (Signed)
Brief history:  [name] is a 67 year old female well-known to our practice status post a left shoulder reverse arthroplasty that we performed back on December 28.  She initially did well postoperatively and has had little if any shoulder pain.  2 days ago she noted the onset of severe pleuritic left-sided chest pain.  She has significant pain with any attempts at deep inspiration and the pain has been unrelenting.  She denies any hemoptysis.  She contacted our office this morning and we requested that she proceed to the emergency room for evaluation.  Examination in the ER demonstrates that her surgical dressing is clean and dry.  Compartments are soft in the left upper extremity and she is grossly neurovascular intact in the left upper extremity.  She is nontender with palpation along the left rib cage.  She does however report severe pain with any attempted deep inspiration.  She has been initially evaluated by the ER staff and a CT angiogram is pending.  We are grateful for the prompt attention of the ER staff.  Marin Shutter MD

## 2022-02-23 NOTE — Discharge Instructions (Addendum)
You have been evaluated for your symptoms.  Fortunately no evidence of blood clot in your lungs or evidence of pneumonia on CT scan.  You do have some fluid buildup on the left side of your lung which may contribute to your current symptoms.  Please take deep breaths regularly and take over-the-counter Tylenol or ibuprofen as needed for pain discomfort.  Follow-up closely with your orthopedist specialist for further care.  Incidentally there is a nodule on the right side of your lung on CT scan.  Please request your primary care doctor to obtain a repeat chest CT in 3 to 6 months for surveillance.

## 2022-02-23 NOTE — ED Provider Triage Note (Signed)
Emergency Medicine Provider Triage Evaluation Note  [name] , a 67 y.o. female  was evaluated in triage.  Pt complains of left-sided rib pain.  Patient had left shoulder surgery 4 days ago.  Pain started this morning.  Called her surgeon who requested that she come in to be evaluated for possible pulmonary embolism.  Has pain with deep inspiration.  Has associated mild shortness of breath.  Does not have a cough.  Has no history of blood clots.  Does not take any blood thinners.  Rates pain at a 7 out of 10  Review of Systems  Positive: As above Negative: As above  Physical Exam  BP 112/64   Pulse (!) 110   Temp 98.3 F (36.8 C) (Oral)   Resp 16   Ht '5\' 4"'$  (1.626 m)   Wt 97.5 kg   SpO2 93%   BMI 36.90 kg/m  Gen:   Awake, no distress   Resp:  Normal effort, halting breaths due to pain MSK:   Moves extremities without difficulty  Other:  Tenderness to palpation of the left lower ribs  Medical Decision Making  Medically screening exam initiated at 11:50 AM.  Appropriate orders placed.  DIANELLY FERRAN was informed that the remainder of the evaluation will be completed by another provider, this initial triage assessment does not replace that evaluation, and the importance of remaining in the ED until their evaluation is complete.     Roylene Reason, [place] 02/23/22 1152

## 2022-02-23 NOTE — ED Provider Notes (Signed)
[facility] DEPT Provider Note   CSN: [id number] Arrival date & time: 02/23/22  1045     History  Chief Complaint  Patient presents with   rib cage pain    [name] is a 67 y.o. female.  The history is provided by the patient and medical records. No language interpreter was used.     67 year old female with history of left shoulder rotator cuff tear arthropathy who underwent a left shoulder reverse arthroplasty by orthopedic surgeon Dr. Onnie Graham on 02/18/2022 presenting today with complaints of shortness of breath.  Patient endorsed pain to the left chest worse with breathing ongoing for the past 2 days.  Pain is moderate in intensity.  No associated fever or chills no cough or hemoptysis.  She does not endorse any exertional chest pain nausea lightheadedness dizziness or diaphoresis.  She is without any complaints of left shoulder pain at the surgical site.  She did reach out to her orthopedist today and was recommended to continue for further assessment and to rule out PE.  She is not on any blood thinner medication at this time.  Home Medications Prior to Admission medications   Medication Sig Start Date End Date Taking? Authorizing Provider  abatacept (ORENCIA) 250 MG injection Inject 750 mg into the vein every 30 (thirty) days.    [provider]  B-D TB SYRINGE 1CC/27GX1/2" 27G X 1/2" 1 ML MISC  01/29/20   [provider]  Biotin 10 MG CAPS Take 10 mg by mouth daily.    [provider]  cyclobenzaprine (FLEXERIL) 10 MG tablet Take 1 tablet (10 mg total) by mouth 3 (three) times daily as needed for muscle spasms. 02/18/22   Shuford, Olivia Mackie, PA-C  EPINEPHrine (EPIPEN 2-PAK) 0.3 mg/0.3 mL IJ SOAJ injection Inject 0.3 mLs (0.3 mg total) into the muscle once. 10/16/18   Burchette, Alinda Sierras, MD  folic acid (FOLVITE) 469 MCG tablet Take 1,600 mcg by mouth daily.    [provider]  HYDROmorphone (DILAUDID) 2 MG tablet  Take 1 tablet (2 mg total) by mouth every 4 (four) hours as needed for severe pain (prn severe post op pain not controlled on current analgesics). 02/18/22   Shuford, Olivia Mackie, PA-C  lisinopril (ZESTRIL) 10 MG tablet Take 1 tablet (10 mg total) by mouth daily. 05/05/21   Burchette, Alinda Sierras, MD  Methotrexate 25 MG/ML SOSY Inject 25 mg into the skin every 7 (seven) days.    [provider]  ondansetron (ZOFRAN) 4 MG tablet Take 1 tablet (4 mg total) by mouth every 8 (eight) hours as needed for nausea or vomiting. 06/10/21   Burchette, Alinda Sierras, MD  ondansetron (ZOFRAN) 4 MG tablet Take 1 tablet (4 mg total) by mouth every 8 (eight) hours as needed for nausea or vomiting. 02/18/22   Shuford, Olivia Mackie, PA-C  polyethylene glycol (MIRALAX / GLYCOLAX) 17 g packet Take 17 g by mouth daily.    [provider]  predniSONE (DELTASONE) 10 MG tablet Take 10 mg by mouth daily with breakfast.    [provider]  rosuvastatin (CRESTOR) 20 MG tablet TAKE 1 TABLET DAILY 02/11/22   Jerline Pain, MD  sertraline (ZOLOFT) 100 MG tablet TAKE 1 TABLET DAILY 01/07/22   Burchette, Alinda Sierras, MD  zolpidem (AMBIEN) 10 MG tablet Take 1 tablet (10 mg total) by mouth at bedtime as needed. for sleep 12/20/21   Eulas Post, MD      Allergies    Raelyn Ensign  venom, Keflex [cephalexin], Codeine sulfate, Penicillamine, and Penicillins    Review of Systems   Review of Systems  All other systems reviewed and are negative.   Physical Exam Updated Vital Signs BP 112/64   Pulse (!) 110   Temp 98.3 F (36.8 C) (Oral)   Resp 16   Ht '5\' 4"'$  (1.626 m)   Wt 97.5 kg   SpO2 93%   BMI 36.90 kg/m  Physical Exam Vitals and nursing note reviewed.  Constitutional:      General: She is not in acute distress.    Appearance: She is well-developed. She is obese.  HENT:     Head: Atraumatic.  Eyes:     Conjunctiva/sclera: Conjunctivae normal.  Cardiovascular:     Rate and Rhythm: Tachycardia present.     Pulses:  Normal pulses.     Heart sounds: Normal heart sounds.  Pulmonary:     Effort: Pulmonary effort is normal.     Comments: Decreased breath sounds with poor effort but no obvious wheezes, rales, or rhonchi heard Abdominal:     Palpations: Abdomen is soft.  Musculoskeletal:     Cervical back: Neck supple.     Right lower leg: No edema.     Left lower leg: No edema.  Skin:    Findings: No rash.  Neurological:     Mental Status: She is alert.  Psychiatric:        Mood and Affect: Mood normal.     ED Results / Procedures / Treatments   Labs (all labs ordered are listed, but only abnormal results are displayed) Labs Reviewed  CBC WITH DIFFERENTIAL/PLATELET - Abnormal; Notable for the following components:      Result Value   RBC 3.73 (*)    Hemoglobin 11.1 (*)    HCT 35.1 (*)    Lymphs Abs 0.4 (*)    All other components within normal limits  BASIC METABOLIC PANEL - Abnormal; Notable for the following components:   Sodium 132 (*)    Chloride 94 (*)    Glucose, Bld 104 (*)    Calcium 8.4 (*)    All other components within normal limits  I-STAT CHEM 8, ED - Abnormal; Notable for the following components:   Sodium 132 (*)    Chloride 96 (*)    Calcium, Ion 1.14 (*)    Hemoglobin 11.9 (*)    HCT 35.0 (*)    All other components within normal limits  BRAIN NATRIURETIC PEPTIDE  TROPONIN I (HIGH SENSITIVITY)  TROPONIN I (HIGH SENSITIVITY)    EKG None  Radiology CT Angio Chest PE W/Cm &/Or Wo Cm  Result Date: 02/23/2022 CLINICAL DATA:  Left shoulder pain left lateral rib pain. EXAM: CT ANGIOGRAPHY CHEST WITH CONTRAST TECHNIQUE: Multidetector CT imaging of the chest was performed using the standard protocol during bolus administration of intravenous contrast. Multiplanar CT image reconstructions and MIPs were obtained to evaluate the vascular anatomy. RADIATION DOSE REDUCTION: This exam was performed according to the departmental dose-optimization program which includes automated  exposure control, adjustment of the mA and/or kV according to patient size and/or use of iterative reconstruction technique. CONTRAST:  179m OMNIPAQUE IOHEXOL 350 MG/ML SOLN COMPARISON:  January 28, 2020 FINDINGS: Cardiovascular: There is mild to moderate severity calcification of the aortic arch, without evidence of aortic aneurysm. Satisfactory opacification of the pulmonary arteries to the segmental level. No evidence of pulmonary embolism. Normal heart size. No pericardial effusion. Mediastinum/Nodes: No enlarged mediastinal, hilar, or axillary lymph nodes.  Thyroid gland, trachea, and esophagus demonstrate no significant findings. Lungs/Pleura: A 10 mm x 6 mm pleural based noncalcified lung nodule versus focal scar seen along the posterolateral aspect of the right upper lobe. Moderate severity atelectasis is seen within the left lung base. There is a small left pleural effusion. No pneumothorax is identified. Upper Abdomen: Surgical sutures are seen within the gastric region. Multiple surgical clips are noted within the gallbladder fossa. Musculoskeletal: A left shoulder replacement is seen. Multilevel degenerative changes are noted throughout the thoracic spine. Review of the MIP images confirms the above findings. IMPRESSION: 1. No evidence of pulmonary embolism. 2. Moderate severity left basilar atelectasis. 3. Small left pleural effusion. 4. 10 mm x 6 mm pleural based noncalcified lung nodule versus focal scar along the posterolateral aspect of the right upper lobe. Consider one of the following in 3 months for both low-risk and high-risk individuals: (a) repeat chest CT, (b) follow-up PET-CT, or (c) tissue sampling. This recommendation follows the consensus statement: Guidelines for Management of Incidental Pulmonary Nodules Detected on CT Images: From the Fleischner Society 2017; Radiology 2017; 284:228-243. 5. Aortic atherosclerosis. Aortic Atherosclerosis (ICD10-I70.0). Electronically Signed   By:  Virgina Norfolk M.D.   On: 02/23/2022 19:27   DG Chest Portable 1 View  Result Date: 02/23/2022 CLINICAL DATA:  Left-sided chest pain. Lateral ribcage pain that is worse with deep breath for 2 days. Left shoulder surgery 5 days ago. EXAM: PORTABLE CHEST 1 VIEW COMPARISON:  Chest x-ray 07/30/2020. FINDINGS: Mild left basilar opacity with a possible tiny left effusion. New from previous. Underinflated. Slight right basilar atelectasis. Apical pleural thickening. No edema. Normal cardiopericardial silhouette and adjusted for technique. Calcified aorta. Left shoulder reverse arthroplasty. IMPRESSION: Underinflation with mild opacity left lung base with a questionable effusion. Recommend follow-up. Electronically Signed   By: Jill Side M.D.   On: 02/23/2022 17:25    Procedures Procedures    Medications Ordered in ED Medications  morphine (PF) 4 MG/ML injection 4 mg (has no administration in time range)  morphine (PF) 4 MG/ML injection 4 mg (4 mg Intramuscular Given 02/23/22 1454)  morphine (PF) 4 MG/ML injection 4 mg (4 mg Intravenous Given 02/23/22 1823)  iohexol (OMNIPAQUE) 350 MG/ML injection 100 mL (100 mLs Intravenous Contrast Given 02/23/22 1910)    ED Course/ Medical Decision Making/ A&P                           Medical Decision Making Amount and/or Complexity of Data Reviewed Radiology: ordered.  Risk Prescription drug management.   BP 112/64   Pulse (!) 110   Temp 98.3 F (36.8 C) (Oral)   Resp 16   Ht '5\' 4"'$  (1.626 m)   Wt 97.5 kg   SpO2 93%   BMI 36.90 kg/m   7:43 PM 67 year old female with history of left shoulder rotator cuff tear arthropathy who underwent a left shoulder reverse arthroplasty by orthopedic surgeon Dr. Onnie Graham on 02/18/2022 presenting today with complaints of shortness of breath.  Patient endorsed pain to the left chest worse with breathing ongoing for the past 2 days.  Pain is moderate in intensity.  No associated fever or chills no cough or hemoptysis.   She does not endorse any exertional chest pain nausea lightheadedness dizziness or diaphoresis.  She is without any complaints of left shoulder pain at the surgical site.  She did reach out to her orthopedist today and was recommended to continue for further assessment  and to rule out PE.  She is not on any blood thinner medication at this time.  On exam, patient is laying in bed wearing a left shoulder sling.  Exam is remarkable for elevated heart rate and decreased lung sounds however patient breathing shallowly.  Left shoulder with normal appearance at the surgical site.  No signs of infection.  Patient is afebrile.  O2 sat at 93% on room air.  Given her presentation, will obtain chest CT angiogram to rule out PE.  Workup initiated.  Nurse informed difficulty with vascular access. IV guided US performed by me with access to RUE.  EMERGENCY DEPARTMENT  US GUIDANCE EXAM Emergency Ultrasound:  US Guidance for Needle Guidance  INDICATIONS: Difficult vascular access Linear probe used in real-time to visualize location of needle entry through skin.   PERFORMED BY: Myself IMAGES ARCHIVED?: No LIMITATIONS: Pain VIEWS USED: Transverse INTERPRETATION: Needle visualized within vein, Right arm, and Needle gauge 20   -Labs ordered, independently viewed and interpreted by me.  Labs remarkable for NA+ 132, showing mild hyponatremia.  Normal troponin, normal BNP -The patient was maintained on a cardiac monitor.  I personally viewed and interpreted the cardiac monitored which showed an underlying rhythm of: sinus tachycardia -Imaging independently viewed and interpreted by me and I agree with radiologist's interpretation.  Result remarkable for chest CTA showing no evidence of PE or PNA.  L pleural effusion noted. No pneumothorax -This patient presents to the ED for concern of pleuritic chest pain, this involves an extensive number of treatment options, and is a complaint that carries with it a high risk of  complications and morbidity.  The differential diagnosis includes PE, PTX, PNA, MSK -Co morbidities that complicate the patient evaluation includes recent surgery -Treatment includes morphine x3 -Reevaluation of the patient after these medicines showed that the patient improved -PCP office notes or outside notes reviewed -Discussion with specialist orthopedist Dr. Onnie Graham who request for PE work up.  Care discussed with Dr. Tomi Bamberger -Escalation to admission/observation considered: patients feels much better, is comfortable with discharge, and will follow up with PCP -Prescription medication considered, patient comfortable with home medications -Social Determinant of Health considered   7:38 PM Fortunately your workup today is overall reassuring.  Chest CT angiogram without any evidence of PE or evidence of pneumothorax.  It was noted that patient has left pleural effusion.  This is likely in the setting of recent surgery and shallow breathing.  Encourage patient to take deep breaths regularly to decrease risk of developing pneumonia.  Incidentally there is a nodule on the right upper lung that was noted on CT scan.  Patient denies tobacco use however encourage patient to have a repeat chest CT in 3 to 6 months for surveillance.  At this time patient is overall stable to be discharged home, return precaution given.  She will follow-up with her orthopedist outpatient for further care.         Final Clinical Impression(s) / ED Diagnoses Final diagnoses:  Pleurisy with effusion  Abnormal CT of the chest    Rx / DC Orders ED Discharge Orders     None         Domenic Moras, PA-C 02/23/22 1943    Dorie Rank, MD 02/24/22 [phone]

## 2022-02-23 NOTE — ED Triage Notes (Signed)
Patient reports that she had left shoulder surgery 5 days ago.  Patient c/o left lateral rib cage pain that is worse with a dep breath x 2 days. Patient called her surgeon and was told to come to the ED to r/o a PE

## 2022-02-24 ENCOUNTER — Encounter (HOSPITAL_COMMUNITY): Payer: Self-pay | Admitting: Orthopedic Surgery

## 2022-03-01 ENCOUNTER — Ambulatory Visit (INDEPENDENT_AMBULATORY_CARE_PROVIDER_SITE_OTHER): Payer: Medicare Other | Admitting: Family Medicine

## 2022-03-01 ENCOUNTER — Encounter: Payer: Self-pay | Admitting: Family Medicine

## 2022-03-01 VITALS — BP 126/70 | HR 88 | Temp 97.7°F | Ht 64.0 in | Wt 213.2 lb

## 2022-03-01 DIAGNOSIS — Z471 Aftercare following joint replacement surgery: Secondary | ICD-10-CM | POA: Diagnosis not present

## 2022-03-01 DIAGNOSIS — J90 Pleural effusion, not elsewhere classified: Secondary | ICD-10-CM

## 2022-03-01 DIAGNOSIS — R911 Solitary pulmonary nodule: Secondary | ICD-10-CM | POA: Diagnosis not present

## 2022-03-01 DIAGNOSIS — Z96612 Presence of left artificial shoulder joint: Secondary | ICD-10-CM | POA: Diagnosis not present

## 2022-03-01 NOTE — Patient Instructions (Signed)
Please let me know if you have not heard back regarding repeat CT chest in 2 weeks.    We are setting this up for about 3 months from now.

## 2022-03-01 NOTE — Progress Notes (Signed)
Established Patient Office Visit  Subjective   Patient ID: [name], female    DOB: [birth year]  Age: 67 y.o. MRN: [id number]  Chief Complaint  Patient presents with   Hospitalization Follow-up    HPI  {History (Optional):23778} [name] is seen for recent hospital follow-up.  She had recent surgery left shoulder for reverse arthroplasty on 02-18-2022.  Her surgery was uneventful.  She then at time of ER visit on the second of this month presented with some shortness of breath and left-sided pleuritic pain.  She spoke with her surgeon who recommended ER evaluation to rule out PE.  No prior history of pulmonary emboli or DVT.  Chest x-ray which was one-view portable showed small left pleural effusion.  She had lab work with troponins and BNP which were unremarkable.  CT angiogram of the chest revealed no evidence for pneumonia or pulmonary embolus.  She did have evidence for noncalcified pleural-based nodule 10 mm x 6 mm right upper lobe Recommendations per radiology were to consider either repeat chest CT in 3 months versus follow-up PET CT versus tissue sampling.  She has never smoked.  Appetite and weight stable.  She takes oxycodone 5/325 mg 1 3 times daily chronically per pain management.  Her left shoulder pain is currently well-controlled.  Her left pleuritic pain is gradually improving.  No fever.  No cough.  No dyspnea at rest.  Past Medical History:  Diagnosis Date   ANGIOMA 10/03/2008   REMOVED FROM RIGHT LOWER LEG-BENIGN   Anxiety    Aortic atherosclerosis (HCC)    Arthritis    RA   Bronchitis    hx of    Cancer (HCC)    BASAL CELL SKIN CANCER   Complication of anesthesia    Coronary artery disease    COVID-19 06/18/2020   HA, chills, myalgias, congestion & fever, all symptoms resolved as of 12/29/20 per patient   Depression    Fibromyalgia    GERD 08/27/2008   History of blood transfusion    1995   History of hiatal hernia    hx of has had surgically  repaired   History of measles    History of mumps    History of nonmelanoma skin cancer    HYPERLIPIDEMIA 08/27/2008   HYPERTENSION 08/27/2008   pt is currently on no meds    Pneumonia    hx of    PONV (postoperative nausea and vomiting)    Rheumatoid arthritis ([place])    SEBORRHEIC KERATOSIS 08/27/2008   Sinus headache    Vocal cord paresis    hx of    Wears glasses    Wears partial dentures    Past Surgical History:  Procedure Laterality Date   ABDOMINAL HYSTERECTOMY  1995   with appendectomy   BREAST BIOPSY Left 2013   [place]  08/16/2011   Procedure: EXCISION DUCTAL SYSTEM BREAST;  Surgeon: Marcello Moores A. Cornett, MD;  Location: [facility];  Service: General;  Laterality: Left;  left breast duct excision   CARPAL TUNNEL RELEASE Right 01/01/2021   Procedure: CARPAL TUNNEL RELEASE;  Surgeon: [name], MD;  Location: [facility];  Service: Orthopedics;  Laterality: Right;   CESAREAN SECTION  1980   times 2; 1980 and [place]  09/15/2018   EXCISION/RELEASE BURSA HIP Right 10/21/2014   Procedure: OPEN RIGHT HIP BURSECTOMY, EXOSECTOMY;  Surgeon: Paralee Cancel, MD;  Location: [facility];  Service: Orthopedics;  Laterality: Right;   foot surgery  2004, 2005, 2008   bilat secondary to neuropathy    JOINT REPLACEMENT  11/2010   lt total knee X2   KNEE ARTHROSCOPY  2007, 2008, 2009   LAPAROSCOPIC GASTRIC SLEEVE RESECTION N/A 10/30/2013   Procedure: LAPAROSCOPIC GASTRIC SLEEVE RESECTION WITH HIATAL HERNIA REPAIR AND UPPER ENDOSCOPY;  Surgeon: Greer Pickerel, MD;  Location: [facility];  Service: General;  Laterality: N/A;   NASAL SINUS SURGERY  2013   OPEN SURGICAL REPAIR OF GLUTEAL TENDON  01/17/2012   Procedure: OPEN SURGICAL REPAIR OF GLUTEAL TENDON;  Surgeon: Mauri Pole, MD;  Location: [facility];  Service: Orthopedics;  Laterality: Right;   REVERSE SHOULDER ARTHROPLASTY Left  02/18/2022   Procedure: REVERSE SHOULDER ARTHROPLASTY;  Surgeon: Justice Britain, MD;  Location: [facility];  Service: Orthopedics;  Laterality: Left;   ROTATOR CUFF REPAIR Bilateral 2020   2021   TOTAL KNEE ARTHROPLASTY Right 04/15/2020   Procedure: TOTAL KNEE ARTHROPLASTY;  Surgeon: Paralee Cancel, MD;  Location: [facility];  Service: Orthopedics;  Laterality: Right;  70 mins   ULNAR TUNNEL RELEASE Right 01/01/2021   Procedure: CUBITAL TUNNEL RELEASE;  Surgeon: [name], MD;  Location: [facility];  Service: Orthopedics;  Laterality: Right;  with MAC anesthesia needs 45 minutes    reports that she quit smoking about 38 years ago. Her smoking use included cigarettes. She has a 1.00 pack-year smoking history. She has never used smokeless tobacco. She reports that she does not drink alcohol and does not use drugs. family history includes Breast cancer (age of onset: 21) in her mother; Cancer in her mother; Diabetes in her son; Epilepsy in her father; Heart attack in her sister and sister; Kidney disease in her father. Allergies  Allergen Reactions   Bee Venom Anaphylaxis   Keflex [Cephalexin] Hives    Pt took po kelfex for sinus infection, developed blisters over entire body   Codeine Sulfate Other (See Comments)    GI upset   Penicillamine Other (See Comments)   Penicillins Rash    Review of Systems  Constitutional:  Negative for chills, fever and weight loss.  Respiratory:  Negative for cough, hemoptysis, sputum production and shortness of breath.   Cardiovascular:  Negative for orthopnea and leg swelling.      Objective:     BP 126/70 (BP Location: Left Arm, Patient Position: Sitting, Cuff Size: Large)   Pulse 88   Temp 97.7 F (36.5 C) (Oral)   Ht _0  (1.626 m)   Wt 213 lb 3.2 oz (96.7 kg)   SpO2 99%   BMI 36.60 kg/m  {Vitals History (Optional):23777}  Physical Exam Vitals reviewed.  Constitutional:      Appearance: Normal appearance.  Cardiovascular:      Rate and Rhythm: Normal rate and regular rhythm.  Pulmonary:     Comments: Slightly diminished breath sounds left base compared to the right.  No wheezes.  No rales. Neurological:     Mental Status: She is alert.      No results found for any visits on 03/01/22.  {Labs (Optional):23779}  The ASCVD Risk score (Arnett DK, et al., 2019) failed to calculate for the following reasons:   The valid HDL cholesterol range is 20 to 100 mg/dL    Assessment & Plan:   #1 left-sided pleuritic pain.  Patient had recent left shoulder reverse arthroplasty and presented with small left pleural effusion as above with  negative CT angiogram for pneumonia or pulmonary embolus.  Pain gradually improving.  Stressed importance of good deep inspiration several times daily.  Follow-up immediately for any fever or increased shortness of breath.  Her pulse oximetry today room air is 99%  #2 small noncalcified right upper lobe pleural-based nodule as above.  She did have mention of small nodule from imaging at [facility].  Denies any recent cough or other concerning symptoms.  We discussed options as above and she has elected for 55-monthfollow-up CT scan.  Will go ahead with future order for CT in 3 months BCarolann Littler MD

## 2022-03-04 DIAGNOSIS — M0589 Other rheumatoid arthritis with rheumatoid factor of multiple sites: Secondary | ICD-10-CM | POA: Diagnosis not present

## 2022-03-09 ENCOUNTER — Ambulatory Visit: Payer: Medicare Other | Attending: Orthopedic Surgery | Admitting: Physical Therapy

## 2022-03-09 ENCOUNTER — Other Ambulatory Visit: Payer: Self-pay

## 2022-03-09 DIAGNOSIS — M25612 Stiffness of left shoulder, not elsewhere classified: Secondary | ICD-10-CM | POA: Diagnosis not present

## 2022-03-09 DIAGNOSIS — M25512 Pain in left shoulder: Secondary | ICD-10-CM | POA: Diagnosis not present

## 2022-03-09 DIAGNOSIS — R600 Localized edema: Secondary | ICD-10-CM | POA: Diagnosis not present

## 2022-03-09 DIAGNOSIS — G8929 Other chronic pain: Secondary | ICD-10-CM | POA: Diagnosis not present

## 2022-03-09 NOTE — Therapy (Signed)
OUTPATIENT PHYSICAL THERAPY SHOULDER EVALUATION   Patient Name: [name] MRN: [id number] DOB:[birth year], 67 y.o., female Today's Date: 03/09/2022  END OF SESSION:  PT End of Session - 03/09/22 1330     Visit Number 1    Number of Visits 8    Date for PT Re-Evaluation 04/06/22    Authorization Type FOTO AT LEAST EVERY 5TH VISIT.  PROGRESS NOTE AT 10TH VISIT.  KX MODIFIER AFTER 15 VISITS.    PT Start Time 1258    PT Stop Time 1347    PT Time Calculation (min) 49 min    Activity Tolerance Patient tolerated treatment well    Behavior During Therapy WFL for tasks assessed/performed             Past Medical History:  Diagnosis Date   ANGIOMA 10/03/2008   REMOVED FROM RIGHT LOWER LEG-BENIGN   Anxiety    Aortic atherosclerosis (HCC)    Arthritis    RA   Bronchitis    hx of    Cancer ([place])    BASAL CELL SKIN CANCER   Complication of anesthesia    Coronary artery disease    COVID-19 06/18/2020   HA, chills, myalgias, congestion & fever, all symptoms resolved as of 12/29/20 per patient   Depression    Fibromyalgia    GERD 08/27/2008   History of blood transfusion    1995   History of hiatal hernia    hx of has had surgically repaired   History of measles    History of mumps    History of nonmelanoma skin cancer    HYPERLIPIDEMIA 08/27/2008   HYPERTENSION 08/27/2008   pt is currently on no meds    Pneumonia    hx of    PONV (postoperative nausea and vomiting)    Rheumatoid arthritis ([place])    SEBORRHEIC KERATOSIS 08/27/2008   Sinus headache    Vocal cord paresis    hx of    Wears glasses    Wears partial dentures    Past Surgical History:  Procedure Laterality Date   [place]   with appendectomy   BREAST BIOPSY Left 2013   [place]  08/16/2011   Procedure: EXCISION DUCTAL SYSTEM BREAST;  Surgeon: Marcello Moores A. Cornett, MD;  Location: [facility];  Service: General;  Laterality: Left;  left breast duct  excision   CARPAL TUNNEL RELEASE Right 01/01/2021   Procedure: CARPAL TUNNEL RELEASE;  Surgeon: [name], MD;  Location: [facility];  Service: Orthopedics;  Laterality: Right;   CESAREAN SECTION  1980   times 2; 1980 and [place]   COLONOSCOPY  09/15/2018   EXCISION/RELEASE BURSA HIP Right 10/21/2014   Procedure: OPEN RIGHT HIP BURSECTOMY, EXOSECTOMY;  Surgeon: Paralee Cancel, MD;  [facility];  Service: Orthopedics;  Laterality: Right;   foot surgery  2004, 2005, 2008   bilat secondary to neuropathy    JOINT REPLACEMENT  11/2010   lt total knee X2   KNEE ARTHROSCOPY  2007, 2008, 2009   LAPAROSCOPIC GASTRIC SLEEVE RESECTION N/A 10/30/2013   Procedure: LAPAROSCOPIC GASTRIC SLEEVE RESECTION WITH HIATAL HERNIA REPAIR AND UPPER ENDOSCOPY;  Surgeon: Greer Pickerel, MD;  [facility];  Service: General;  Laterality: N/A;   NASAL SINUS SURGERY  2013   OPEN SURGICAL REPAIR OF GLUTEAL TENDON  01/17/2012   Procedure: OPEN SURGICAL REPAIR OF GLUTEAL TENDON;  Surgeon:  Mauri Pole, MD;  [facility];  Service: Orthopedics;  Laterality: Right;   REVERSE SHOULDER ARTHROPLASTY Left 02/18/2022   Procedure: REVERSE SHOULDER ARTHROPLASTY;  Surgeon: Justice Britain, MD;  [facility];  Service: Orthopedics;  Laterality: Left;   ROTATOR CUFF REPAIR Bilateral 2020   2021   TOTAL KNEE ARTHROPLASTY Right 04/15/2020   Procedure: TOTAL KNEE ARTHROPLASTY;  Surgeon: Paralee Cancel, MD;  [facility];  Service: Orthopedics;  Laterality: Right;  70 mins   ULNAR TUNNEL RELEASE Right 01/01/2021   Procedure: CUBITAL TUNNEL RELEASE;  Surgeon: [name], MD;  Location: [facility];  Service: Orthopedics;  Laterality: Right;  with MAC anesthesia needs 45 minutes   Patient Active Problem List   Diagnosis Date Noted   Osteopenia 03/04/2021   Primary osteoarthritis 12/22/2020   Acquired trigger finger of left ring finger  12/09/2020   Pain in right hand 12/09/2020   Hiatal hernia 05/13/2020   Obese 04/16/2020   Osteoarthritis of right knee 04/16/2020   S/P total knee arthroplasty, right 04/15/2020   Pulmonary nodules 12/05/2019   Age-related vocal fold atrophy 09/14/2019   Hoarseness 09/14/2019   Laryngospasms 09/14/2019   Vocal fold paresis, unilateral 09/14/2019   Impingement syndrome of left shoulder region 09/13/2019   Impingement syndrome of right shoulder region 02/10/2019   Bilateral occipital neuralgia 09/26/2017   Arthritis of right knee 03/30/2017   Status post left foot surgery 07/07/2015   Neuroma 05/20/2015   HAV (hallux abducto valgus) 05/20/2015   Hammertoe 05/20/2015   Prominent metatarsal head 05/20/2015   Chronic foot pain 05/20/2015   Anxiety state 03/03/2015   Hip bursitis 10/22/2014   Osteophyte of hip 10/21/2014   Epigastric cramping 11/09/2013   Nausea with vomiting 11/06/2013   Rheumatoid arthritis ([place]) 11/01/2013   S/P laparoscopic sleeve gastrectomy 10/30/2013   Anemia 06/04/2013   CAP (community acquired pneumonia) 02/17/2013   Restless leg syndrome 09/18/2012   Morbid obesity with BMI of 45.0-49.9, adult ([place]) 01/18/2012   Right gluteus tear 01/17/2012   Nipple discharge in female 07/08/2011   Chronic insomnia 06/18/2011   Discharge from nipple 01/06/2011   TMJ PAIN 01/12/2010   DEPRESSION 12/10/2009   SINUSITIS, ACUTE 12/30/2008   Lumbago 12/30/2008   EDEMA 10/03/2008   Hyperlipidemia 08/27/2008   Essential hypertension 08/27/2008   GERD 08/27/2008    REFERRING PROVIDER: Justice Britain MD  REFERRING DIAG: Reverse left total shoulder replacement.  THERAPY DIAG:  Chronic left shoulder pain  Stiffness of left shoulder, not elsewhere classified  Localized edema  Rationale for Evaluation and Treatment: Rehabilitation  ONSET DATE: 02/19/22 (surgery date).  SUBJECTIVE:  SUBJECTIVE STATEMENT: The patient presents to the clinic s/p left reverse total shoulder replacement performed on 02/09/22.  She is out of her sling and is pleased with her surgical outcome thus far.  Her pain-level at rest today is a 4/10 and higher with certain movements of her left shoulder.  Placing a pillow behind her shoulder and ice helps decrease her pain.  PERTINENT HISTORY: Fibromyalgia, RA, bilateral RCR, bilateral TKA's, right CTS.    PAIN:  Are you having pain? Yes: NPRS scale: 4/10 Pain location: Left shoulder. Pain description: Sore and ache. Aggravating factors: As above. Relieving factors: As above.  PRECAUTIONS: Other: Per protocol.  No ultrasound.  WEIGHT BEARING RESTRICTIONS: Yes No left UE weight bearing.  FALLS:  Has patient fallen in last 6 months? 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  PLOF: Independent with basic ADLs  PATIENT GOALS:Use left UE without pain.  NEXT MD VISIT:   OBJECTIVE:    PATIENT SURVEYS:  FOTO...Marland KitchenPatient's intake functional measure is 31 on a scale approximating 0 - 100 (higher number = greater function).  OBSERVATION: Min+ left shoulder edema.  Incision appears to be healing well.     UPPER EXTREMITY ROM:   In supine:  Gentle left shoulder flexion to 90 degrees, ER to 15 degrees and abduction to 50 degrees.  PALPATION:  Diffuse tenderness currently especially anteriorly.   TODAY'S TREATMENT:                                                                                                                                         DATE: Gentle left shoulder passive range of motion x 8 minutes into flexion, ER and abduction f/b Vasopneumatic on low with pillow between  thorax and elbow x 15 minutes.  Patient tolerated treatment without complaint with normal modality response following removal of modality.     PATIENT EDUCATION: Education details: See below. Person educated: Patient Education method: Explanation, Demonstration, Tactile cues, Verbal cues, and Handouts Education comprehension: verbalized understanding and returned demonstration  HOME EXERCISE PROGRAM: HOME EXERCISE PROGRAM Created by Mali Rhapsody Wolven Jan 10th, 2024 View at [email or web address] using code: [id number] Total 1 Page 1 of 1 WAND EXTERNAL ROTATION - SUPINE ER Lie on your back holding a cane or wand with both hands.  On the affected side, place a small rolled up towel or pillow under your elbow. Maintain approx. 90 degree bend at the elbow with your arm approximately 30-45 degrees away from your side. GENTLE. PAIN-FREE Use your other arm to pull the wand/cane to rotate the affected arm back into a stretch. Hold and then return to starting position and then repeat. Repeat 10 Times Hold 15 Seconds Complete 1 Set Perform 4-6 Times a Day  ASSESSMENT:  CLINICAL IMPRESSION:  The patient presents to [facility] s/p left reverse total shoulder replacement perform on 02/09/22.  She is very pleased with her surgical outcome thus far.  She has an expected loss of motion currently.  We discussed that we would be following Dr. Susie Cassette protocol.  Her incision appears to be healing well.  She has some edema and diffuse anterior tenderness.  Patient will benefit from skilled physical therapy intervention to address pain and deficits.  OBJECTIVE IMPAIRMENTS: decreased activity tolerance, decreased ROM, increased edema, impaired UE functional use, and pain.   ACTIVITY LIMITATIONS: carrying, lifting, bathing, dressing, and reach over head  PARTICIPATION LIMITATIONS: meal prep, cleaning, laundry, driving, and occupation  PERSONAL FACTORS: Time since onset of injury/illness/exacerbation and 1  comorbidity: prior RCR repair surgery  are also affecting patient's functional outcome.   REHAB POTENTIAL: Excellent  CLINICAL DECISION MAKING: Stable/uncomplicated  EVALUATION COMPLEXITY: Low   GOALS:   LONG TERM GOALS: Target date: 04/06/22. (Per  LTG's per protocol and appropriate timelines).  Ind with HEP. Goal status: INITIAL  2.  Active left shoulder flexion to 135 degrees+ so the patient can easily reach overhead. Goal status: INITIAL  3.  Active ER to 70 degrees+ to allow for easily donning/doffing of apparel. Goal status: INITIAL  4.  Increase shoulder left strength to a solid 4+/5 to increase stability for performance of functional activities. Goal status: INITIAL  5.  Perform ADL's with pain not > 3/10. Goal status: INITIAL    PLAN:  PT FREQUENCY: 2x/week  PT DURATION: 4 weeks  PLANNED INTERVENTIONS: Therapeutic exercises, Therapeutic activity, Neuromuscular re-education, Patient/Family education, Self Care, Electrical stimulation, Cryotherapy, Moist heat, Vasopneumatic device, and Manual therapy  PLAN FOR NEXT SESSION: Progress per protocol.  Vasopneumatic with pillow between thorax and elbow.   Tarisa Paola, [place], PT 03/09/2022, 1:54 PM

## 2022-03-16 ENCOUNTER — Ambulatory Visit: Payer: Medicare Other | Admitting: Physical Therapy

## 2022-03-16 ENCOUNTER — Encounter: Payer: Self-pay | Admitting: Family Medicine

## 2022-03-16 ENCOUNTER — Encounter: Payer: Self-pay | Admitting: Physical Therapy

## 2022-03-16 DIAGNOSIS — R600 Localized edema: Secondary | ICD-10-CM | POA: Diagnosis not present

## 2022-03-16 DIAGNOSIS — M25512 Pain in left shoulder: Secondary | ICD-10-CM | POA: Diagnosis not present

## 2022-03-16 DIAGNOSIS — M25612 Stiffness of left shoulder, not elsewhere classified: Secondary | ICD-10-CM

## 2022-03-16 DIAGNOSIS — G8929 Other chronic pain: Secondary | ICD-10-CM

## 2022-03-16 NOTE — Therapy (Addendum)
OUTPATIENT PHYSICAL THERAPY SHOULDER EVALUATION   Patient Name: [name] MRN: [id number] DOB:1956-01-15, 67 y.o., female Today's Date: 03/16/2022  END OF SESSION:  PT End of Session - 03/16/22 1347     Visit Number 2    Number of Visits 8    Date for PT Re-Evaluation 04/06/22    Authorization Type FOTO AT LEAST EVERY 5TH VISIT.  PROGRESS NOTE AT 10TH VISIT.  KX MODIFIER AFTER 15 VISITS.    PT Start Time 1346    PT Stop Time 1434    PT Time Calculation (min) 48 min    Activity Tolerance Patient tolerated treatment well    Behavior During Therapy WFL for tasks assessed/performed            Past Medical History:  Diagnosis Date   ANGIOMA 10/03/2008   REMOVED FROM RIGHT LOWER LEG-BENIGN   Anxiety    Aortic atherosclerosis (HCC)    Arthritis    RA   Bronchitis    hx of    Cancer ([place])    BASAL CELL SKIN CANCER   Complication of anesthesia    Coronary artery disease    COVID-19 06/18/2020   HA, chills, myalgias, congestion & fever, all symptoms resolved as of 12/29/20 per patient   Depression    Fibromyalgia    GERD 08/27/2008   History of blood transfusion    1995   History of hiatal hernia    hx of has had surgically repaired   History of measles    History of mumps    History of nonmelanoma skin cancer    HYPERLIPIDEMIA 08/27/2008   HYPERTENSION 08/27/2008   pt is currently on no meds    Pneumonia    hx of    PONV (postoperative nausea and vomiting)    Rheumatoid arthritis ([place])    SEBORRHEIC KERATOSIS 08/27/2008   Sinus headache    Vocal cord paresis    hx of    Wears glasses    Wears partial dentures    Past Surgical History:  Procedure Laterality Date   [place]   with appendectomy   BREAST BIOPSY Left 2013   [place]  08/16/2011   Procedure: EXCISION DUCTAL SYSTEM BREAST;  Surgeon: Marcello Moores A. Cornett, MD;  Location: [facility];  Service: General;  Laterality: Left;  left breast duct  excision   CARPAL TUNNEL RELEASE Right 01/01/2021   Procedure: CARPAL TUNNEL RELEASE;  Surgeon: [name], MD;  Location: [facility];  Service: Orthopedics;  Laterality: Right;   CESAREAN SECTION  1980   times 2; 1980 and [place]   COLONOSCOPY  09/15/2018   EXCISION/RELEASE BURSA HIP Right 10/21/2014   Procedure: OPEN RIGHT HIP BURSECTOMY, EXOSECTOMY;  Surgeon: Paralee Cancel, MD;  [facility];  Service: Orthopedics;  Laterality: Right;   foot surgery  2004, 2005, 2008   bilat secondary to neuropathy    JOINT REPLACEMENT  11/2010   lt total knee X2   KNEE ARTHROSCOPY  2007, 2008, 2009   LAPAROSCOPIC GASTRIC SLEEVE RESECTION N/A 10/30/2013   Procedure: LAPAROSCOPIC GASTRIC SLEEVE RESECTION WITH HIATAL HERNIA REPAIR AND UPPER ENDOSCOPY;  Surgeon: Greer Pickerel, MD;  [facility];  Service: General;  Laterality: N/A;   NASAL SINUS SURGERY  2013   OPEN SURGICAL REPAIR OF GLUTEAL TENDON  01/17/2012   Procedure: OPEN SURGICAL REPAIR OF GLUTEAL TENDON;  Surgeon: Rodman Key  Marian Sorrow, MD;  [facility];  Service: Orthopedics;  Laterality: Right;   REVERSE SHOULDER ARTHROPLASTY Left 02/18/2022   Procedure: REVERSE SHOULDER ARTHROPLASTY;  Surgeon: Justice Britain, MD;  [facility];  Service: Orthopedics;  Laterality: Left;   ROTATOR CUFF REPAIR Bilateral 2020   2021   TOTAL KNEE ARTHROPLASTY Right 04/15/2020   Procedure: TOTAL KNEE ARTHROPLASTY;  Surgeon: Paralee Cancel, MD;  [facility];  Service: Orthopedics;  Laterality: Right;  70 mins   ULNAR TUNNEL RELEASE Right 01/01/2021   Procedure: CUBITAL TUNNEL RELEASE;  Surgeon: [name], MD;  Location: [facility];  Service: Orthopedics;  Laterality: Right;  with MAC anesthesia needs 45 minutes   Patient Active Problem List   Diagnosis Date Noted   Osteopenia 03/04/2021   Primary osteoarthritis 12/22/2020   Acquired trigger finger of left ring finger  12/09/2020   Pain in right hand 12/09/2020   Hiatal hernia 05/13/2020   Obese 04/16/2020   Osteoarthritis of right knee 04/16/2020   S/P total knee arthroplasty, right 04/15/2020   Pulmonary nodules 12/05/2019   Age-related vocal fold atrophy 09/14/2019   Hoarseness 09/14/2019   Laryngospasms 09/14/2019   Vocal fold paresis, unilateral 09/14/2019   Impingement syndrome of left shoulder region 09/13/2019   Impingement syndrome of right shoulder region 02/10/2019   Bilateral occipital neuralgia 09/26/2017   Arthritis of right knee 03/30/2017   Status post left foot surgery 07/07/2015   Neuroma 05/20/2015   HAV (hallux abducto valgus) 05/20/2015   Hammertoe 05/20/2015   Prominent metatarsal head 05/20/2015   Chronic foot pain 05/20/2015   Anxiety state 03/03/2015   Hip bursitis 10/22/2014   Osteophyte of hip 10/21/2014   Epigastric cramping 11/09/2013   Nausea with vomiting 11/06/2013   Rheumatoid arthritis ([place]) 11/01/2013   S/P laparoscopic sleeve gastrectomy 10/30/2013   Anemia 06/04/2013   CAP (community acquired pneumonia) 02/17/2013   Restless leg syndrome 09/18/2012   Morbid obesity with BMI of 45.0-49.9, adult ([place]) 01/18/2012   Right gluteus tear 01/17/2012   Nipple discharge in female 07/08/2011   Chronic insomnia 06/18/2011   Discharge from nipple 01/06/2011   TMJ PAIN 01/12/2010   DEPRESSION 12/10/2009   SINUSITIS, ACUTE 12/30/2008   Lumbago 12/30/2008   EDEMA 10/03/2008   Hyperlipidemia 08/27/2008   Essential hypertension 08/27/2008   GERD 08/27/2008   REFERRING PROVIDER: Justice Britain MD  REFERRING DIAG: Reverse left total shoulder replacement.  THERAPY DIAG:  Chronic left shoulder pain  Stiffness of left shoulder, not elsewhere classified  Localized edema  Rationale for Evaluation and Treatment: Rehabilitation  ONSET DATE: 02/19/22 (surgery date).  SUBJECTIVE:  SUBJECTIVE STATEMENT: ADLs are limited but got out of sling last week.  PERTINENT HISTORY: Fibromyalgia, RA, bilateral RCR, bilateral TKA's, right CTS.    PAIN:  Are you having pain? Yes: NPRS scale: 4/10 Pain location: Left shoulder. Pain description: Sore and ache. Aggravating factors: As above. Relieving factors: As above.  PRECAUTIONS: Other: Per protocol.  No ultrasound.  WEIGHT BEARING RESTRICTIONS: Yes No left UE weight bearing.  FALLS:  Has patient fallen in last 6 months? 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  PLOF: Independent with basic ADLs  PATIENT GOALS:Use left UE without pain.  NEXT MD VISIT:   OBJECTIVE:    PATIENT SURVEYS:  FOTO...Marland KitchenPatient's intake functional measure is 31 on a scale approximating 0 - 100 (higher number = greater function).  TODAY'S TREATMENT:                                                                                                                                         DATE: 03/16/2022                                   EXERCISE LOG  Exercise Repetitions and Resistance Comments  [place] X3 min   UE ranger Into flex, mini circles x5 min Rest breaks due to pain and fatigue               Blank cell = exercise not performed today   Manual Therapy Passive ROM: L shoulder, PROM into flexion, ER, IR with gentle holds at end range  Modalities  Date: 03/16/22 Vaso: Shoulder, Low, 15 mins, Pain  PATIENT EDUCATION: Education details: See below. Person educated: Patient Education method: Explanation, Demonstration, Tactile cues, Verbal cues, and Handouts Education comprehension: verbalized understanding and returned demonstration  HOME EXERCISE PROGRAM: HOME EXERCISE  PROGRAM Created by Mali Applegate Jan 10th, 2024 View at [email or web address] using code: [email or web address] Total 1 Page 1 of 1 WAND EXTERNAL ROTATION - SUPINE ER Lie on your back holding a cane or wand with both hands.  On the affected side, place a small rolled up towel or pillow under your elbow. Maintain approx. 90 degree bend at the elbow with your arm approximately 30-45 degrees away from your side. GENTLE. PAIN-FREE Use your other arm to pull the wand/cane to rotate the affected arm back into a stretch. Hold and then return to starting position and then repeat. Repeat 10 Times Hold 15 Seconds Complete 1 Set Perform 4-6 Times a Day  ASSESSMENT:  CLINICAL IMPRESSION: Patient presented in clinic with mild L shoulder pain. Patient introduced to light AAROM exercises in sitting with RUE assisting as needed. Intermittent rest breaks required to control pain and fatigue. Firm  end feels and smooth arc of motion noted during PROM session. Normal vasopnuematic response noted following removal of the modality.  OBJECTIVE IMPAIRMENTS: decreased activity tolerance, decreased ROM, increased edema, impaired UE functional use, and pain.   ACTIVITY LIMITATIONS: carrying, lifting, bathing, dressing, and reach over head  PARTICIPATION LIMITATIONS: meal prep, cleaning, laundry, driving, and occupation  PERSONAL FACTORS: Time since onset of injury/illness/exacerbation and 1 comorbidity: prior RCR repair surgery  are also affecting patient's functional outcome.   REHAB POTENTIAL: Excellent  CLINICAL DECISION MAKING: Stable/uncomplicated  EVALUATION COMPLEXITY: Low  GOALS:  LONG TERM GOALS: Target date: 04/06/22. (Per  LTG's per protocol and appropriate timelines).  Ind with HEP. Goal status: INITIAL  2.  Active left shoulder flexion to 135 degrees+ so the patient can easily reach overhead. Goal status: INITIAL  3.  Active ER to 70 degrees+ to allow for easily donning/doffing of apparel. Goal  status: INITIAL  4.  Increase shoulder left strength to a solid 4+/5 to increase stability for performance of functional activities. Goal status: INITIAL  5.  Perform ADL's with pain not > 3/10. Goal status: INITIAL  PLAN:  PT FREQUENCY: 2x/week  PT DURATION: 4 weeks  PLANNED INTERVENTIONS: Therapeutic exercises, Therapeutic activity, Neuromuscular re-education, Patient/Family education, Self Care, Electrical stimulation, Cryotherapy, Moist heat, Vasopneumatic device, and Manual therapy  PLAN FOR NEXT SESSION: Progress per protocol.  Vasopneumatic with pillow between thorax and elbow.  Standley Brooking, PTA 03/16/2022, 2:38 PM

## 2022-03-18 ENCOUNTER — Ambulatory Visit: Payer: Medicare Other

## 2022-03-18 ENCOUNTER — Other Ambulatory Visit (HOSPITAL_COMMUNITY): Payer: Self-pay

## 2022-03-18 DIAGNOSIS — M25612 Stiffness of left shoulder, not elsewhere classified: Secondary | ICD-10-CM

## 2022-03-18 DIAGNOSIS — G8929 Other chronic pain: Secondary | ICD-10-CM | POA: Diagnosis not present

## 2022-03-18 DIAGNOSIS — R600 Localized edema: Secondary | ICD-10-CM

## 2022-03-18 DIAGNOSIS — M25512 Pain in left shoulder: Secondary | ICD-10-CM | POA: Diagnosis not present

## 2022-03-18 NOTE — Therapy (Signed)
OUTPATIENT PHYSICAL THERAPY SHOULDER TREATMENT    Patient Name: [name] MRN: [id number] DOB:05-24-1955, 67 y.o., female Today's Date: 03/18/2022  END OF SESSION:  PT End of Session - 03/18/22 1349     Visit Number 3    Number of Visits 8    Date for PT Re-Evaluation 04/06/22    Authorization Type FOTO AT LEAST EVERY 5TH VISIT.  PROGRESS NOTE AT 10TH VISIT.  KX MODIFIER AFTER 15 VISITS.    PT Start Time 1345    PT Stop Time 1423    PT Time Calculation (min) 38 min    Activity Tolerance Patient tolerated treatment well    Behavior During Therapy WFL for tasks assessed/performed            Past Medical History:  Diagnosis Date   ANGIOMA 10/03/2008   REMOVED FROM RIGHT LOWER LEG-BENIGN   Anxiety    Aortic atherosclerosis (HCC)    Arthritis    RA   Bronchitis    hx of    Cancer ([place])    BASAL CELL SKIN CANCER   Complication of anesthesia    Coronary artery disease    COVID-19 06/18/2020   HA, chills, myalgias, congestion & fever, all symptoms resolved as of 12/29/20 per patient   Depression    Fibromyalgia    GERD 08/27/2008   History of blood transfusion    1995   History of hiatal hernia    hx of has had surgically repaired   History of measles    History of mumps    History of nonmelanoma skin cancer    HYPERLIPIDEMIA 08/27/2008   HYPERTENSION 08/27/2008   pt is currently on no meds    Pneumonia    hx of    PONV (postoperative nausea and vomiting)    Rheumatoid arthritis ([place])    SEBORRHEIC KERATOSIS 08/27/2008   Sinus headache    Vocal cord paresis    hx of    Wears glasses    Wears partial dentures    Past Surgical History:  Procedure Laterality Date   [place]   with appendectomy   BREAST BIOPSY Left 2013   [place]  08/16/2011   Procedure: EXCISION DUCTAL SYSTEM BREAST;  Surgeon: Marcello Moores A. Cornett, MD;  Location: [facility];  Service: General;  Laterality: Left;  left breast duct  excision   CARPAL TUNNEL RELEASE Right 01/01/2021   Procedure: CARPAL TUNNEL RELEASE;  Surgeon: [name], MD;  Location: [facility];  Service: Orthopedics;  Laterality: Right;   CESAREAN SECTION  1980   times 2; 1980 and [place]   COLONOSCOPY  09/15/2018   EXCISION/RELEASE BURSA HIP Right 10/21/2014   Procedure: OPEN RIGHT HIP BURSECTOMY, EXOSECTOMY;  Surgeon: Paralee Cancel, MD;  [facility];  Service: Orthopedics;  Laterality: Right;   foot surgery  2004, 2005, 2008   bilat secondary to neuropathy    JOINT REPLACEMENT  11/2010   lt total knee X2   KNEE ARTHROSCOPY  2007, 2008, 2009   LAPAROSCOPIC GASTRIC SLEEVE RESECTION N/A 10/30/2013   Procedure: LAPAROSCOPIC GASTRIC SLEEVE RESECTION WITH HIATAL HERNIA REPAIR AND UPPER ENDOSCOPY;  Surgeon: Greer Pickerel, MD;  [facility];  Service: General;  Laterality: N/A;   NASAL SINUS SURGERY  2013   OPEN SURGICAL REPAIR OF GLUTEAL TENDON  01/17/2012   Procedure: OPEN SURGICAL REPAIR OF GLUTEAL TENDON;  Surgeon:  Mauri Pole, MD;  [facility];  Service: Orthopedics;  Laterality: Right;   REVERSE SHOULDER ARTHROPLASTY Left 02/18/2022   Procedure: REVERSE SHOULDER ARTHROPLASTY;  Surgeon: Justice Britain, MD;  [facility];  Service: Orthopedics;  Laterality: Left;   ROTATOR CUFF REPAIR Bilateral 2020   2021   TOTAL KNEE ARTHROPLASTY Right 04/15/2020   Procedure: TOTAL KNEE ARTHROPLASTY;  Surgeon: Paralee Cancel, MD;  [facility];  Service: Orthopedics;  Laterality: Right;  70 mins   ULNAR TUNNEL RELEASE Right 01/01/2021   Procedure: CUBITAL TUNNEL RELEASE;  Surgeon: [name], MD;  Location: [facility];  Service: Orthopedics;  Laterality: Right;  with MAC anesthesia needs 45 minutes   Patient Active Problem List   Diagnosis Date Noted   Osteopenia 03/04/2021   Primary osteoarthritis 12/22/2020   Acquired trigger finger of left ring finger  12/09/2020   Pain in right hand 12/09/2020   Hiatal hernia 05/13/2020   Obese 04/16/2020   Osteoarthritis of right knee 04/16/2020   S/P total knee arthroplasty, right 04/15/2020   Pulmonary nodules 12/05/2019   Age-related vocal fold atrophy 09/14/2019   Hoarseness 09/14/2019   Laryngospasms 09/14/2019   Vocal fold paresis, unilateral 09/14/2019   Impingement syndrome of left shoulder region 09/13/2019   Impingement syndrome of right shoulder region 02/10/2019   Bilateral occipital neuralgia 09/26/2017   Arthritis of right knee 03/30/2017   Status post left foot surgery 07/07/2015   Neuroma 05/20/2015   HAV (hallux abducto valgus) 05/20/2015   Hammertoe 05/20/2015   Prominent metatarsal head 05/20/2015   Chronic foot pain 05/20/2015   Anxiety state 03/03/2015   Hip bursitis 10/22/2014   Osteophyte of hip 10/21/2014   Epigastric cramping 11/09/2013   Nausea with vomiting 11/06/2013   Rheumatoid arthritis ([place]) 11/01/2013   S/P laparoscopic sleeve gastrectomy 10/30/2013   Anemia 06/04/2013   CAP (community acquired pneumonia) 02/17/2013   Restless leg syndrome 09/18/2012   Morbid obesity with BMI of 45.0-49.9, adult ([place]) 01/18/2012   Right gluteus tear 01/17/2012   Nipple discharge in female 07/08/2011   Chronic insomnia 06/18/2011   Discharge from nipple 01/06/2011   TMJ PAIN 01/12/2010   DEPRESSION 12/10/2009   SINUSITIS, ACUTE 12/30/2008   Lumbago 12/30/2008   EDEMA 10/03/2008   Hyperlipidemia 08/27/2008   Essential hypertension 08/27/2008   GERD 08/27/2008   REFERRING PROVIDER: Justice Britain MD  REFERRING DIAG: Reverse left total shoulder replacement.  THERAPY DIAG:  Chronic left shoulder pain  Stiffness of left shoulder, not elsewhere classified  Localized edema  Rationale for Evaluation and Treatment: Rehabilitation  ONSET DATE: 02/19/22 (surgery date).  SUBJECTIVE:  SUBJECTIVE STATEMENT: Patient reports that her shoulder is sore, but no more than normal.   PERTINENT HISTORY: Fibromyalgia, RA, bilateral RCR, bilateral TKA's, right CTS.    PAIN:  Are you having pain? Yes: NPRS scale: 3/10 Pain location: Left shoulder. Pain description: Sore and ache. Aggravating factors: As above. Relieving factors: As above.  PRECAUTIONS: Other: Per protocol.  No ultrasound.  WEIGHT BEARING RESTRICTIONS: Yes No left UE weight bearing.  FALLS:  Has patient fallen in last 6 months? 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  PLOF: Independent with basic ADLs  PATIENT GOALS:Use left UE without pain.  NEXT MD VISIT:   OBJECTIVE:    PATIENT SURVEYS:  FOTO...Marland KitchenPatient's intake functional measure is 31 on a scale approximating 0 - 100 (higher number = greater function).  TODAY'S TREATMENT:                                                                                                                                         DATE:                                    1/25 EXERCISE LOG  Exercise Repetitions and Resistance Comments  Pulleys  X5 minutes   Isometric shoulder flexion  20 reps w/ 5 second hold    Isometric shoulder ADD 2 minutes w/ 5 second hold Added to HEP   Resisted row  Red t-band x 2 minutes   Ball roll out (flexion)  2.5 minutes   Ball roll out (abduction)  3 minutes    Blank cell = exercise not performed today  Modalities  Date:  Vaso: Shoulder, 34 degrees; low pressure, 10 mins, Pain                                    03/16/2022 EXERCISE LOG  Exercise Repetitions and Resistance Comments  Pulley X3 min   UE ranger Into flex, mini circles x5 min Rest breaks due to pain and fatigue                Blank cell = exercise not performed today   Manual Therapy Passive ROM: L shoulder, PROM into flexion, ER, IR with gentle holds at end range  Modalities  Date: 03/16/22 Vaso: Shoulder, Low, 15 mins, Pain  PATIENT EDUCATION: Education details: See below. Person educated: Patient Education method: Explanation, Demonstration, Tactile cues, Verbal cues, and Handouts Education comprehension: verbalized understanding and returned demonstration  HOME EXERCISE PROGRAM: HOME EXERCISE PROGRAM Created by Mali Applegate Jan 10th, 2024 View at [email or web address] using code: [id number] Total 1 Page 1 of 1  WAND EXTERNAL ROTATION - SUPINE ER Lie on your back holding a cane or wand with both hands.  On the affected side, place a small rolled up towel or pillow under your elbow. Maintain approx. 90 degree bend at the elbow with your arm approximately 30-45 degrees away from your side. GENTLE. PAIN-FREE Use your other arm to pull the wand/cane to rotate the affected arm back into a stretch. Hold and then return to starting position and then repeat. Repeat 10 Times Hold 15 Seconds Complete 1 Set Perform 4-6 Times a Day  ASSESSMENT:  CLINICAL IMPRESSION: Patient was introduced to multiple new interventions for improved shoulder mobility and light musculature engagement. She required minimal cueing with today's isometric interventions for improved shoulder mobility. She experienced a moderate increase in her familiar pain with isometric shoulder, but this did not inhibit her ability to complete any of today's other interventions. She reported feeling good upon the conclusion of treatment. She continues to require skilled physical therapy to address her remaining impairments to return to her prior level of function.   OBJECTIVE IMPAIRMENTS: decreased activity tolerance, decreased ROM, increased edema, impaired UE functional use, and pain.   ACTIVITY LIMITATIONS: carrying, lifting,  bathing, dressing, and reach over head  PARTICIPATION LIMITATIONS: meal prep, cleaning, laundry, driving, and occupation  PERSONAL FACTORS: Time since onset of injury/illness/exacerbation and 1 comorbidity: prior RCR repair surgery  are also affecting patient's functional outcome.   REHAB POTENTIAL: Excellent  CLINICAL DECISION MAKING: Stable/uncomplicated  EVALUATION COMPLEXITY: Low  GOALS:  LONG TERM GOALS: Target date: 04/06/22. (Per  LTG's per protocol and appropriate timelines).  Ind with HEP. Goal status: INITIAL  2.  Active left shoulder flexion to 135 degrees+ so the patient can easily reach overhead. Goal status: INITIAL  3.  Active ER to 70 degrees+ to allow for easily donning/doffing of apparel. Goal status: INITIAL  4.  Increase shoulder left strength to a solid 4+/5 to increase stability for performance of functional activities. Goal status: INITIAL  5.  Perform ADL's with pain not > 3/10. Goal status: INITIAL  PLAN:  PT FREQUENCY: 2x/week  PT DURATION: 4 weeks  PLANNED INTERVENTIONS: Therapeutic exercises, Therapeutic activity, Neuromuscular re-education, Patient/Family education, Self Care, Electrical stimulation, Cryotherapy, Moist heat, Vasopneumatic device, and Manual therapy  PLAN FOR NEXT SESSION: Progress per protocol.  Vasopneumatic with pillow between thorax and elbow.  Darlin Coco, PT 03/18/2022, 2:46 PM

## 2022-03-22 ENCOUNTER — Encounter: Payer: Self-pay | Admitting: Family Medicine

## 2022-03-22 ENCOUNTER — Ambulatory Visit: Payer: Medicare Other | Admitting: Physical Therapy

## 2022-03-22 DIAGNOSIS — G8929 Other chronic pain: Secondary | ICD-10-CM

## 2022-03-22 DIAGNOSIS — R600 Localized edema: Secondary | ICD-10-CM | POA: Diagnosis not present

## 2022-03-22 DIAGNOSIS — M25612 Stiffness of left shoulder, not elsewhere classified: Secondary | ICD-10-CM | POA: Diagnosis not present

## 2022-03-22 DIAGNOSIS — M25512 Pain in left shoulder: Secondary | ICD-10-CM | POA: Diagnosis not present

## 2022-03-22 NOTE — Therapy (Signed)
OUTPATIENT PHYSICAL THERAPY SHOULDER TREATMENT    Patient Name: [name] MRN: [id number] DOB:29-Apr-1955, 67 y.o., female Today's Date: 03/22/2022  END OF SESSION:  PT End of Session - 03/22/22 1313     Visit Number 4    Number of Visits 8    Date for PT Re-Evaluation 04/06/22    Authorization Type FOTO AT LEAST EVERY 5TH VISIT.  PROGRESS NOTE AT 10TH VISIT.  KX MODIFIER AFTER 15 VISITS.    PT Start Time 0100    PT Stop Time 0153    PT Time Calculation (min) 53 min    Activity Tolerance Patient tolerated treatment well    Behavior During Therapy WFL for tasks assessed/performed            Past Medical History:  Diagnosis Date   ANGIOMA 10/03/2008   REMOVED FROM RIGHT LOWER LEG-BENIGN   Anxiety    Aortic atherosclerosis (HCC)    Arthritis    RA   Bronchitis    hx of    Cancer ([place])    BASAL CELL SKIN CANCER   Complication of anesthesia    Coronary artery disease    COVID-19 06/18/2020   HA, chills, myalgias, congestion & fever, all symptoms resolved as of 12/29/20 per patient   Depression    Fibromyalgia    GERD 08/27/2008   History of blood transfusion    1995   History of hiatal hernia    hx of has had surgically repaired   History of measles    History of mumps    History of nonmelanoma skin cancer    HYPERLIPIDEMIA 08/27/2008   HYPERTENSION 08/27/2008   pt is currently on no meds    Pneumonia    hx of    PONV (postoperative nausea and vomiting)    Rheumatoid arthritis ([place])    SEBORRHEIC KERATOSIS 08/27/2008   Sinus headache    Vocal cord paresis    hx of    Wears glasses    Wears partial dentures    Past Surgical History:  Procedure Laterality Date   [place]   with appendectomy   BREAST BIOPSY Left 2013   [place]  08/16/2011   Procedure: EXCISION DUCTAL SYSTEM BREAST;  Surgeon: Marcello Moores A. Cornett, MD;  Location: [facility];  Service: General;  Laterality: Left;  left breast duct  excision   CARPAL TUNNEL RELEASE Right 01/01/2021   Procedure: CARPAL TUNNEL RELEASE;  Surgeon: [name], MD;  Location: [facility];  Service: Orthopedics;  Laterality: Right;   CESAREAN SECTION  1980   times 2; 1980 and [place]   COLONOSCOPY  09/15/2018   EXCISION/RELEASE BURSA HIP Right 10/21/2014   Procedure: OPEN RIGHT HIP BURSECTOMY, EXOSECTOMY;  Surgeon: Paralee Cancel, MD;  [facility];  Service: Orthopedics;  Laterality: Right;   foot surgery  2004, 2005, 2008   bilat secondary to neuropathy    JOINT REPLACEMENT  11/2010   lt total knee X2   KNEE ARTHROSCOPY  2007, 2008, 2009   LAPAROSCOPIC GASTRIC SLEEVE RESECTION N/A 10/30/2013   Procedure: LAPAROSCOPIC GASTRIC SLEEVE RESECTION WITH HIATAL HERNIA REPAIR AND UPPER ENDOSCOPY;  Surgeon: Greer Pickerel, MD;  [facility];  Service: General;  Laterality: N/A;   NASAL SINUS SURGERY  2013   OPEN SURGICAL REPAIR OF GLUTEAL TENDON  01/17/2012   Procedure: OPEN SURGICAL REPAIR OF GLUTEAL TENDON;  Surgeon:  Mauri Pole, MD;  [facility];  Service: Orthopedics;  Laterality: Right;   REVERSE SHOULDER ARTHROPLASTY Left 02/18/2022   Procedure: REVERSE SHOULDER ARTHROPLASTY;  Surgeon: Justice Britain, MD;  [facility];  Service: Orthopedics;  Laterality: Left;   ROTATOR CUFF REPAIR Bilateral 2020   2021   TOTAL KNEE ARTHROPLASTY Right 04/15/2020   Procedure: TOTAL KNEE ARTHROPLASTY;  Surgeon: Paralee Cancel, MD;  [facility];  Service: Orthopedics;  Laterality: Right;  70 mins   ULNAR TUNNEL RELEASE Right 01/01/2021   Procedure: CUBITAL TUNNEL RELEASE;  Surgeon: [name], MD;  Location: [facility];  Service: Orthopedics;  Laterality: Right;  with MAC anesthesia needs 45 minutes   Patient Active Problem List   Diagnosis Date Noted   Osteopenia 03/04/2021   Primary osteoarthritis 12/22/2020   Acquired trigger finger of left ring finger  12/09/2020   Pain in right hand 12/09/2020   Hiatal hernia 05/13/2020   Obese 04/16/2020   Osteoarthritis of right knee 04/16/2020   S/P total knee arthroplasty, right 04/15/2020   Pulmonary nodules 12/05/2019   Age-related vocal fold atrophy 09/14/2019   Hoarseness 09/14/2019   Laryngospasms 09/14/2019   Vocal fold paresis, unilateral 09/14/2019   Impingement syndrome of left shoulder region 09/13/2019   Impingement syndrome of right shoulder region 02/10/2019   Bilateral occipital neuralgia 09/26/2017   Arthritis of right knee 03/30/2017   Status post left foot surgery 07/07/2015   Neuroma 05/20/2015   HAV (hallux abducto valgus) 05/20/2015   Hammertoe 05/20/2015   Prominent metatarsal head 05/20/2015   Chronic foot pain 05/20/2015   Anxiety state 03/03/2015   Hip bursitis 10/22/2014   Osteophyte of hip 10/21/2014   Epigastric cramping 11/09/2013   Nausea with vomiting 11/06/2013   Rheumatoid arthritis ([place]) 11/01/2013   S/P laparoscopic sleeve gastrectomy 10/30/2013   Anemia 06/04/2013   CAP (community acquired pneumonia) 02/17/2013   Restless leg syndrome 09/18/2012   Morbid obesity with BMI of 45.0-49.9, adult ([place]) 01/18/2012   Right gluteus tear 01/17/2012   Nipple discharge in female 07/08/2011   Chronic insomnia 06/18/2011   Discharge from nipple 01/06/2011   TMJ PAIN 01/12/2010   DEPRESSION 12/10/2009   SINUSITIS, ACUTE 12/30/2008   Lumbago 12/30/2008   EDEMA 10/03/2008   Hyperlipidemia 08/27/2008   Essential hypertension 08/27/2008   GERD 08/27/2008   REFERRING PROVIDER: Justice Britain MD  REFERRING DIAG: Reverse left total shoulder replacement.  THERAPY DIAG:  Chronic left shoulder pain  Stiffness of left shoulder, not elsewhere classified  Localized edema  Rationale for Evaluation and Treatment: Rehabilitation  ONSET DATE: 02/19/22 (surgery date).  SUBJECTIVE:  SUBJECTIVE STATEMENT: Patient reports that her shoulder is sore, but no more than normal. Staying around a 3.  PERTINENT HISTORY: Fibromyalgia, RA, bilateral RCR, bilateral TKA's, right CTS.    PAIN:  Are you having pain? Yes: NPRS scale: 3/10 Pain location: Left shoulder. Pain description: Sore and ache. Aggravating factors: As above. Relieving factors: As above.  PRECAUTIONS: Other: Per protocol.  No ultrasound.  WEIGHT BEARING RESTRICTIONS: Yes No left UE weight bearing.  FALLS:  Has patient fallen in last 6 months? 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  PLOF: Independent with basic ADLs  PATIENT GOALS:Use left UE without pain.  NEXT MD VISIT:   OBJECTIVE:    PATIENT SURVEYS:  FOTO...Marland KitchenPatient's intake functional measure is 31 on a scale approximating 0 - 100 (higher number = greater function).  TODAY'S TREATMENT:                                                                                                                                         DATE:                                    1/29 EXERCISE LOG  Exercise Repetitions and Resistance Comments  Pulleys  X 6 minutes   AA Wall ladder 3 minutes   Seated UE Ranger  4 minutes                In supine:  Gentle PROM per protocol to patient's left shoulder x 15 minutes.  Date:  Vaso: Shoulder, 34 degrees; low pressure, 10 mins, Pain  pillow between left elbow and side.                                    03/16/2022 EXERCISE LOG  Exercise Repetitions and Resistance Comments  Pulley X3 min   UE ranger Into flex, mini circles x5 min Rest breaks due to pain and fatigue               Blank cell = exercise not performed today   Manual Therapy Passive  ROM: L shoulder, PROM into flexion, ER, IR with gentle holds at end range  Modalities  Date: 03/16/22 Vaso: Shoulder, Low, 15 mins, Pain  PATIENT EDUCATION: Education details: See below. Person educated: Patient Education method: Explanation, Demonstration, Tactile cues, Verbal cues, and Handouts Education comprehension: verbalized understanding and returned demonstration  HOME EXERCISE PROGRAM: HOME EXERCISE PROGRAM Created by Mali Olis Viverette Jan 10th, 2024 View at [email or web address] using code: [email or web address] Total 1 Page 1 of 1 WAND EXTERNAL ROTATION - SUPINE ER Lie on your back holding a  cane or wand with both hands.  On the affected side, place a small rolled up towel or pillow under your elbow. Maintain approx. 90 degree bend at the elbow with your arm approximately 30-45 degrees away from your side. GENTLE. PAIN-FREE Use your other arm to pull the wand/cane to rotate the affected arm back into a stretch. Hold and then return to starting position and then repeat. Repeat 10 Times Hold 15 Seconds Complete 1 Set Perform 4-6 Times a Day  ASSESSMENT:  CLINICAL IMPRESSION: Patient progressing per protocol.  Shoulder remains sore with a pain-level at a 3/10.    OBJECTIVE IMPAIRMENTS: decreased activity tolerance, decreased ROM, increased edema, impaired UE functional use, and pain.   ACTIVITY LIMITATIONS: carrying, lifting, bathing, dressing, and reach over head  PARTICIPATION LIMITATIONS: meal prep, cleaning, laundry, driving, and occupation  PERSONAL FACTORS: Time since onset of injury/illness/exacerbation and 1 comorbidity: prior RCR repair surgery  are also affecting patient's functional outcome.   REHAB POTENTIAL: Excellent  CLINICAL DECISION MAKING: Stable/uncomplicated  EVALUATION COMPLEXITY: Low  GOALS:  LONG TERM GOALS: Target date: 04/06/22. (Per  LTG's per protocol and appropriate timelines).  Ind with HEP. Goal status: INITIAL  2.  Active left shoulder  flexion to 135 degrees+ so the patient can easily reach overhead. Goal status: INITIAL  3.  Active ER to 70 degrees+ to allow for easily donning/doffing of apparel. Goal status: INITIAL  4.  Increase shoulder left strength to a solid 4+/5 to increase stability for performance of functional activities. Goal status: INITIAL  5.  Perform ADL's with pain not > 3/10. Goal status: INITIAL  PLAN:  PT FREQUENCY: 2x/week  PT DURATION: 4 weeks  PLANNED INTERVENTIONS: Therapeutic exercises, Therapeutic activity, Neuromuscular re-education, Patient/Family education, Self Care, Electrical stimulation, Cryotherapy, Moist heat, Vasopneumatic device, and Manual therapy  PLAN FOR NEXT SESSION: Progress per protocol.  Vasopneumatic with pillow between thorax and elbow.  Anvitha Hutmacher, [place], PT 03/22/2022, 2:01 PM

## 2022-03-22 NOTE — Telephone Encounter (Signed)
Patient has been added to schedule for tomorrow

## 2022-03-23 ENCOUNTER — Ambulatory Visit (INDEPENDENT_AMBULATORY_CARE_PROVIDER_SITE_OTHER): Payer: Medicare Other | Admitting: Family Medicine

## 2022-03-23 ENCOUNTER — Ambulatory Visit (HOSPITAL_BASED_OUTPATIENT_CLINIC_OR_DEPARTMENT_OTHER): Payer: Medicare Other

## 2022-03-23 ENCOUNTER — Encounter: Payer: Self-pay | Admitting: Family Medicine

## 2022-03-23 VITALS — BP 144/70 | HR 79 | Temp 97.8°F | Ht 64.0 in | Wt 213.3 lb

## 2022-03-23 DIAGNOSIS — Z6836 Body mass index (BMI) 36.0-36.9, adult: Secondary | ICD-10-CM | POA: Diagnosis not present

## 2022-03-23 DIAGNOSIS — R300 Dysuria: Secondary | ICD-10-CM

## 2022-03-23 DIAGNOSIS — M0589 Other rheumatoid arthritis with rheumatoid factor of multiple sites: Secondary | ICD-10-CM | POA: Diagnosis not present

## 2022-03-23 DIAGNOSIS — G5603 Carpal tunnel syndrome, bilateral upper limbs: Secondary | ICD-10-CM | POA: Diagnosis not present

## 2022-03-23 DIAGNOSIS — M79641 Pain in right hand: Secondary | ICD-10-CM | POA: Diagnosis not present

## 2022-03-23 DIAGNOSIS — M797 Fibromyalgia: Secondary | ICD-10-CM | POA: Diagnosis not present

## 2022-03-23 DIAGNOSIS — E669 Obesity, unspecified: Secondary | ICD-10-CM | POA: Diagnosis not present

## 2022-03-23 DIAGNOSIS — M1991 Primary osteoarthritis, unspecified site: Secondary | ICD-10-CM | POA: Diagnosis not present

## 2022-03-23 LAB — POC URINALSYSI DIPSTICK (AUTOMATED)
Bilirubin, UA: NEGATIVE
Blood, UA: POSITIVE
Glucose, UA: NEGATIVE
Ketones, UA: NEGATIVE
Nitrite, UA: NEGATIVE
Protein, UA: POSITIVE — AB
Spec Grav, UA: >=1.03 — AB (ref 1.010–1.025)
Urobilinogen, UA: 0.2 E.U./dL
pH, UA: 5.5 (ref 5.0–8.0)

## 2022-03-23 MED ORDER — NITROFURANTOIN MONOHYD MACRO 100 MG PO CAPS: 100.0000 mg | ORAL_CAPSULE | Freq: Two times a day (BID) | ORAL | 0 refills | Status: DC

## 2022-03-23 NOTE — Progress Notes (Signed)
Established Patient Office Visit  Subjective   Patient ID: [name], female    DOB: 1955-10-14  Age: 67 y.o. MRN: [id number]  Chief Complaint  Patient presents with   Urinary Tract Infection    HPI   [name] is seen with concern for possible UTI.  She states she had symptoms about 2 weeks.  She initially thought this was more of a yeast infection and tried over-the-counter Azo and 3 days of antifungal treatment without much improvement.  She has some burning with urination now.  Denies any nausea, vomiting, or flank pain.  No documented fever.  No gross hematuria.  No recent UTI.  She is allergic to Keflex and penicillins.  Past Medical History:  Diagnosis Date   ANGIOMA 10/03/2008   REMOVED FROM RIGHT LOWER LEG-BENIGN   Anxiety    Aortic atherosclerosis (HCC)    Arthritis    RA   Bronchitis    hx of    Cancer (HCC)    BASAL CELL SKIN CANCER   Complication of anesthesia    Coronary artery disease    COVID-19 06/18/2020   HA, chills, myalgias, congestion & fever, all symptoms resolved as of 12/29/20 per patient   Depression    Fibromyalgia    GERD 08/27/2008   History of blood transfusion    1995   History of hiatal hernia    hx of has had surgically repaired   History of measles    History of mumps    History of nonmelanoma skin cancer    HYPERLIPIDEMIA 08/27/2008   HYPERTENSION 08/27/2008   pt is currently on no meds    Pneumonia    hx of    PONV (postoperative nausea and vomiting)    Rheumatoid arthritis ([place])    SEBORRHEIC KERATOSIS 08/27/2008   Sinus headache    Vocal cord paresis    hx of    Wears glasses    Wears partial dentures    Past Surgical History:  Procedure Laterality Date   ABDOMINAL HYSTERECTOMY  1995   with appendectomy   BREAST BIOPSY Left 2013   [place]  08/16/2011   Procedure: EXCISION DUCTAL SYSTEM BREAST;  Surgeon: Marcello Moores A. Cornett, MD;  Location: [place];  Service: General;   Laterality: Left;  left breast duct excision   CARPAL TUNNEL RELEASE Right 01/01/2021   Procedure: CARPAL TUNNEL RELEASE;  Surgeon: [name], MD;  Location: [place];  Service: Orthopedics;  Laterality: Right;   CESAREAN SECTION  1980   times 2; 1980 and [place]   COLONOSCOPY  09/15/2018   EXCISION/RELEASE BURSA HIP Right 10/21/2014   Procedure: OPEN RIGHT HIP BURSECTOMY, EXOSECTOMY;  Surgeon: Paralee Cancel, MD;  Location: [facility];  Service: Orthopedics;  Laterality: Right;   foot surgery  2004, 2005, 2008   bilat secondary to neuropathy    JOINT REPLACEMENT  11/2010   lt total knee X2   KNEE ARTHROSCOPY  2007, 2008, 2009   LAPAROSCOPIC GASTRIC SLEEVE RESECTION N/A 10/30/2013   Procedure: LAPAROSCOPIC GASTRIC SLEEVE RESECTION WITH HIATAL HERNIA REPAIR AND UPPER ENDOSCOPY;  Surgeon: Greer Pickerel, MD;  Location: [facility];  Service: General;  Laterality: N/A;   NASAL SINUS SURGERY  2013   OPEN SURGICAL REPAIR OF GLUTEAL TENDON  01/17/2012   Procedure: OPEN SURGICAL REPAIR OF GLUTEAL TENDON;  Surgeon: Mauri Pole, MD;  Location: [facility];  Service:  Orthopedics;  Laterality: Right;   REVERSE SHOULDER ARTHROPLASTY Left 02/18/2022   Procedure: REVERSE SHOULDER ARTHROPLASTY;  Surgeon: Justice Britain, MD;  Location: [facility];  Service: Orthopedics;  Laterality: Left;   ROTATOR CUFF REPAIR Bilateral 2020   2021   TOTAL KNEE ARTHROPLASTY Right 04/15/2020   Procedure: TOTAL KNEE ARTHROPLASTY;  Surgeon: Paralee Cancel, MD;  Location: [facility];  Service: Orthopedics;  Laterality: Right;  70 mins   ULNAR TUNNEL RELEASE Right 01/01/2021   Procedure: CUBITAL TUNNEL RELEASE;  Surgeon: [name], MD;  Location: [place];  Service: Orthopedics;  Laterality: Right;  with MAC anesthesia needs 45 minutes    reports that she quit smoking about 38 years ago. Her smoking use included cigarettes. She has a 1.00 pack-year smoking  history. She has never used smokeless tobacco. She reports that she does not drink alcohol and does not use drugs. family history includes Breast cancer (age of onset: 88) in her mother; Cancer in her mother; Diabetes in her son; Epilepsy in her father; Heart attack in her sister and sister; Kidney disease in her father. Allergies  Allergen Reactions   Bee Venom Anaphylaxis   Keflex [Cephalexin] Hives    Pt took po kelfex for sinus infection, developed blisters over entire body   Codeine Sulfate Other (See Comments)    GI upset   Penicillamine Other (See Comments)   Penicillins Rash    Review of Systems  Constitutional:  Negative for chills and fever.  Genitourinary:  Positive for dysuria and frequency. Negative for flank pain.      Objective:     BP (!) 144/70   Pulse 79   Temp 97.8 F (36.6 C) (Oral)   Ht 5' 4"  (1.626 m)   Wt 213 lb 4.8 oz (96.8 kg)   SpO2 98%   BMI 36.61 kg/m    Physical Exam Vitals reviewed.  Cardiovascular:     Rate and Rhythm: Normal rate and regular rhythm.  Pulmonary:     Effort: Pulmonary effort is normal.     Breath sounds: Normal breath sounds.  Neurological:     Mental Status: She is alert.      Results for orders placed or performed in visit on 03/23/22  POCT Urinalysis Dipstick (Automated)  Result Value Ref Range   Color, UA Yellow    Clarity, UA Clear    Glucose, UA Negative Negative   Bilirubin, UA Negative    Ketones, UA Negative    Spec Grav, UA >=1.030 (A) 1.010 - 1.025   Blood, UA Positive    pH, UA 5.5 5.0 - 8.0   Protein, UA Positive (A) Negative   Urobilinogen, UA 0.2 0.2 or 1.0 E.U./dL   Nitrite, UA Negative    Leukocytes, UA Small (1+) (A) Negative      The ASCVD Risk score (Arnett DK, et al., 2019) failed to calculate for the following reasons:   The valid HDL cholesterol range is 20 to 100 mg/dL    Assessment & Plan:   Problem List Items Addressed This Visit   None Visit Diagnoses     Dysuria    -   Primary   Relevant Orders   POCT Urinalysis Dipstick (Automated) (Completed)   Culture, Urine     Probable uncomplicated cystitis.  She does have 1+ leukocytes along with positive blood.  Urine culture sent.  She has allergy to penicillin and Keflex.  Start Macrobid 1 twice daily for 5 days.  Follow-up for any persistent  or worsening symptoms.  No follow-ups on file.    Carolann Littler, MD

## 2022-03-25 ENCOUNTER — Encounter: Payer: Self-pay | Admitting: *Deleted

## 2022-03-25 ENCOUNTER — Ambulatory Visit: Payer: Medicare Other | Attending: Orthopedic Surgery | Admitting: *Deleted

## 2022-03-25 DIAGNOSIS — G8929 Other chronic pain: Secondary | ICD-10-CM | POA: Insufficient documentation

## 2022-03-25 DIAGNOSIS — M25612 Stiffness of left shoulder, not elsewhere classified: Secondary | ICD-10-CM | POA: Insufficient documentation

## 2022-03-25 DIAGNOSIS — R600 Localized edema: Secondary | ICD-10-CM | POA: Diagnosis not present

## 2022-03-25 DIAGNOSIS — M25512 Pain in left shoulder: Secondary | ICD-10-CM | POA: Diagnosis not present

## 2022-03-25 LAB — URINE CULTURE
MICRO NUMBER:: 14493144
SPECIMEN QUALITY:: ADEQUATE

## 2022-03-25 NOTE — Therapy (Signed)
OUTPATIENT PHYSICAL THERAPY SHOULDER TREATMENT    Patient Name: [name] MRN: [id number] DOB:04-10-1955, 67 y.o., female Today's Date: 03/25/2022  END OF SESSION:  PT End of Session - 03/25/22 1356     Visit Number 5    Number of Visits 8    Authorization Type FOTO AT [place].  PROGRESS NOTE AT 10TH VISIT.  KX MODIFIER AFTER 15 VISITS.    PT Start Time 1645    PT Stop Time 4098    PT Time Calculation (min) 32 min            Past Medical History:  Diagnosis Date   ANGIOMA 10/03/2008   REMOVED FROM RIGHT LOWER LEG-BENIGN   Anxiety    Aortic atherosclerosis (HCC)    Arthritis    RA   Bronchitis    hx of    Cancer (HCC)    BASAL CELL SKIN CANCER   Complication of anesthesia    Coronary artery disease    COVID-19 06/18/2020   HA, chills, myalgias, congestion & fever, all symptoms resolved as of 12/29/20 per patient   Depression    Fibromyalgia    GERD 08/27/2008   History of blood transfusion    1995   History of hiatal hernia    hx of has had surgically repaired   History of measles    History of mumps    History of nonmelanoma skin cancer    HYPERLIPIDEMIA 08/27/2008   HYPERTENSION 08/27/2008   pt is currently on no meds    Pneumonia    hx of    PONV (postoperative nausea and vomiting)    Rheumatoid arthritis ([place])    SEBORRHEIC KERATOSIS 08/27/2008   Sinus headache    Vocal cord paresis    hx of    Wears glasses    Wears partial dentures    Past Surgical History:  Procedure Laterality Date   [place]   with appendectomy   BREAST BIOPSY Left 2013   [place]  08/16/2011   Procedure: EXCISION DUCTAL SYSTEM BREAST;  Surgeon: Marcello Moores A. Cornett, MD;  Location: [place];  Service: General;  Laterality: Left;  left breast duct excision   CARPAL TUNNEL RELEASE Right 01/01/2021   Procedure: CARPAL TUNNEL RELEASE;  Surgeon: [name], MD;  Location: [place];   Service: Orthopedics;  Laterality: Right;   CESAREAN SECTION  1980   times 2; 1980 and [place]   COLONOSCOPY  09/15/2018   EXCISION/RELEASE BURSA HIP Right 10/21/2014   Procedure: OPEN RIGHT HIP BURSECTOMY, EXOSECTOMY;  Surgeon: Paralee Cancel, MD;  Location[facility];  Service: Orthopedics;  Laterality: Right;   foot surgery  2004, 2005, 2008   bilat secondary to neuropathy    JOINT REPLACEMENT  11/2010   lt total knee X2   KNEE ARTHROSCOPY  2007, 2008, 2009   LAPAROSCOPIC GASTRIC SLEEVE RESECTION N/A 10/30/2013   Procedure: LAPAROSCOPIC GASTRIC SLEEVE RESECTION WITH HIATAL HERNIA REPAIR AND UPPER ENDOSCOPY;  Surgeon: Greer Pickerel, MD;  [facility];  Service: General;  Laterality: N/A;   NASAL SINUS SURGERY  2013   OPEN SURGICAL REPAIR OF GLUTEAL TENDON  01/17/2012   Procedure: OPEN SURGICAL REPAIR OF GLUTEAL TENDON;  Surgeon: Mauri Pole, MD;  [facility];  Service: Orthopedics;  Laterality: Right;   REVERSE SHOULDER ARTHROPLASTY Left 02/18/2022   Procedure: REVERSE SHOULDER ARTHROPLASTY;  Surgeon: Justice Britain, MD;  Location[facility];  Service: Orthopedics;  Laterality: Left;   ROTATOR CUFF REPAIR Bilateral 2020   2021   TOTAL KNEE ARTHROPLASTY Right 04/15/2020   Procedure: TOTAL KNEE ARTHROPLASTY;  Surgeon: Paralee Cancel, MD;  Location[facility];  Service: Orthopedics;  Laterality: Right;  70 mins   ULNAR TUNNEL RELEASE Right 01/01/2021   Procedure: CUBITAL TUNNEL RELEASE;  Surgeon: [name], MD;  Location: [place];  Service: Orthopedics;  Laterality: Right;  with MAC anesthesia needs 45 minutes   Patient Active Problem List   Diagnosis Date Noted   Osteopenia 03/04/2021   Primary osteoarthritis 12/22/2020   Acquired trigger finger of left ring finger 12/09/2020   Pain in right hand 12/09/2020   Hiatal hernia 05/13/2020   Obese 04/16/2020   Osteoarthritis of right knee 04/16/2020   S/P total knee  arthroplasty, right 04/15/2020   Pulmonary nodules 12/05/2019   Age-related vocal fold atrophy 09/14/2019   Hoarseness 09/14/2019   Laryngospasms 09/14/2019   Vocal fold paresis, unilateral 09/14/2019   Impingement syndrome of left shoulder region 09/13/2019   Impingement syndrome of right shoulder region 02/10/2019   Bilateral occipital neuralgia 09/26/2017   Arthritis of right knee 03/30/2017   Status post left foot surgery 07/07/2015   Neuroma 05/20/2015   HAV (hallux abducto valgus) 05/20/2015   Hammertoe 05/20/2015   Prominent metatarsal head 05/20/2015   Chronic foot pain 05/20/2015   Anxiety state 03/03/2015   Hip bursitis 10/22/2014   Osteophyte of hip 10/21/2014   Epigastric cramping 11/09/2013   Nausea with vomiting 11/06/2013   Rheumatoid arthritis ([place]) 11/01/2013   S/P laparoscopic sleeve gastrectomy 10/30/2013   Anemia 06/04/2013   CAP (community acquired pneumonia) 02/17/2013   Restless leg syndrome 09/18/2012   Morbid obesity with BMI of 45.0-49.9, adult ([place]) 01/18/2012   Right gluteus tear 01/17/2012   Nipple discharge in female 07/08/2011   Chronic insomnia 06/18/2011   Discharge from nipple 01/06/2011   TMJ PAIN 01/12/2010   DEPRESSION 12/10/2009   SINUSITIS, ACUTE 12/30/2008   Lumbago 12/30/2008   EDEMA 10/03/2008   Hyperlipidemia 08/27/2008   Essential hypertension 08/27/2008   GERD 08/27/2008   REFERRING PROVIDER: Justice Britain MD  REFERRING DIAG: Reverse left total shoulder replacement.  THERAPY DIAG:  Chronic left shoulder pain  Stiffness of left shoulder, not elsewhere classified  Localized edema  Rationale for Evaluation and Treatment: Rehabilitation  ONSET DATE: 02/19/22 (surgery date).  SUBJECTIVE:  SUBJECTIVE STATEMENT: Patient reports that her shoulder  is sore, but no more than normal. Staying around a 3. Need to leave 15 mins early.  PERTINENT HISTORY: Fibromyalgia, RA, bilateral RCR, bilateral TKA's, right CTS.    PAIN:  Are you having pain? Yes: NPRS scale: 3/10 Pain location: Left shoulder. Pain description: Sore and ache. Aggravating factors: As above. Relieving factors: As above.  PRECAUTIONS: Other: Per protocol.  No ultrasound.  WEIGHT BEARING RESTRICTIONS: Yes No left UE weight bearing.  FALLS:  Has patient fallen in last 6 months? 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  PLOF: Independent with basic ADLs  PATIENT GOALS:Use left UE without pain.  NEXT MD VISIT:   OBJECTIVE:    PATIENT SURVEYS:  FOTO...Marland KitchenPatient's intake functional measure is 31 on a scale approximating 0 - 100 (higher number = greater function).  TODAY'S TREATMENT:                                                                                                                                         DATE:                                    03-25-22 EXERCISE LOG LT Shldr  Exercise Repetitions and Resistance Comments  Pulleys  X 6 minutes   AA Wall ladder    Seated UE Ranger      Ball on table  X 6  mins           In supine:  Gentle PROM and Rhythmic stab. For LT shldr  per protocol to patient's left shoulder. STW/ TPR To LT UT x 15 mins  Date:  Vaso: Shoulder,  ,   mins, Pain Not today due to Pt leaving early                                    03/16/2022 EXERCISE LOG  Exercise Repetitions and Resistance Comments  [place] X3 min   UE ranger Into flex, mini circles x5 min Rest breaks due to pain and fatigue               Blank cell = exercise not performed today   Manual Therapy Passive ROM: L shoulder, PROM into flexion, ER, IR with gentle holds at end  range  Modalities  Date: 03/16/22 Vaso: Shoulder, Low, 15 mins, Pain  PATIENT EDUCATION: Education details: See below. Person educated: Patient Education method: Explanation, Demonstration, Tactile cues, Verbal cues, and Handouts Education comprehension: verbalized understanding and returned demonstration  HOME EXERCISE PROGRAM: HOME EXERCISE PROGRAM Created by Mali Applegate Jan 10th, 2024  View at [email or web address] using code: [id number] Total 1 Page 1 of 1 WAND EXTERNAL ROTATION - SUPINE ER Lie on your back holding a cane or wand with both hands.  On the affected side, place a small rolled up towel or pillow under your elbow. Maintain approx. 90 degree bend at the elbow with your arm approximately 30-45 degrees away from your side. GENTLE. PAIN-FREE Use your other arm to pull the wand/cane to rotate the affected arm back into a stretch. Hold and then return to starting position and then repeat. Repeat 10 Times Hold 15 Seconds Complete 1 Set Perform 4-6 Times a Day  ASSESSMENT:  CLINICAL IMPRESSION: Patient arrived today reporting needing to leave early today. She is  doing fairly well with LT shldr , but reports pain and tightness in LT UT. She was able to continue with AAROM exs as well as PROM and did well. Rhythmic stab also performed in supine at different angles for LT shldr As well as STW and TPR to LT Utrap with good release.   OBJECTIVE IMPAIRMENTS: decreased activity tolerance, decreased ROM, increased edema, impaired UE functional use, and pain.   ACTIVITY LIMITATIONS: carrying, lifting, bathing, dressing, and reach over head  PARTICIPATION LIMITATIONS: meal prep, cleaning, laundry, driving, and occupation  PERSONAL FACTORS: Time since onset of injury/illness/exacerbation and 1 comorbidity: prior RCR repair surgery  are also affecting patient's functional outcome.   REHAB POTENTIAL: Excellent  CLINICAL DECISION MAKING: Stable/uncomplicated  EVALUATION  COMPLEXITY: Low  GOALS:  LONG TERM GOALS: Target date: 04/06/22. (Per  LTG's per protocol and appropriate timelines).  Ind with HEP. Goal status: MET  2.  Active left shoulder flexion to 135 degrees+ so the patient can easily reach overhead. Goal status: On-going  3.  Active ER to 70 degrees+ to allow for easily donning/doffing of apparel. Goal status: On-going  4.  Increase shoulder left strength to a solid 4+/5 to increase stability for performance of functional activities. Goal status: On-going  5.  Perform ADL's with pain not > 3/10. Goal status: On-going PLAN:  PT FREQUENCY: 2x/week  PT DURATION: 4 weeks  PLANNED INTERVENTIONS: Therapeutic exercises, Therapeutic activity, Neuromuscular re-education, Patient/Family education, Self Care, Electrical stimulation, Cryotherapy, Moist heat, Vasopneumatic device, and Manual therapy  PLAN FOR NEXT SESSION: Progress per protocol.  Vasopneumatic with pillow between thorax and elbow.  Lidie Glade,CHRIS, PTA 03/25/2022, 2:58 PM

## 2022-03-29 ENCOUNTER — Ambulatory Visit: Payer: Medicare Other | Admitting: Physical Therapy

## 2022-03-29 DIAGNOSIS — M25612 Stiffness of left shoulder, not elsewhere classified: Secondary | ICD-10-CM

## 2022-03-29 DIAGNOSIS — M25512 Pain in left shoulder: Secondary | ICD-10-CM | POA: Diagnosis not present

## 2022-03-29 DIAGNOSIS — R600 Localized edema: Secondary | ICD-10-CM | POA: Diagnosis not present

## 2022-03-29 DIAGNOSIS — G8929 Other chronic pain: Secondary | ICD-10-CM

## 2022-03-29 NOTE — Therapy (Signed)
OUTPATIENT PHYSICAL THERAPY SHOULDER TREATMENT    Patient Name: [name] MRN: [id number] DOB:Aug 01, 1955, 67 y.o., female Today's Date: 03/29/2022  END OF SESSION:  PT End of Session - 03/29/22 1452     Visit Number 6    Number of Visits 8    Date for PT Re-Evaluation 04/06/22    Authorization Type FOTO AT LEAST EVERY 5TH VISIT.  PROGRESS NOTE AT 10TH VISIT.  KX MODIFIER AFTER 15 VISITS.    PT Start Time 0145    PT Stop Time 0222    PT Time Calculation (min) 37 min    Activity Tolerance Patient tolerated treatment well    Behavior During Therapy WFL for tasks assessed/performed            Past Medical History:  Diagnosis Date   ANGIOMA 10/03/2008   REMOVED FROM RIGHT LOWER LEG-BENIGN   Anxiety    Aortic atherosclerosis (HCC)    Arthritis    RA   Bronchitis    hx of    Cancer ([place])    BASAL CELL SKIN CANCER   Complication of anesthesia    Coronary artery disease    COVID-19 06/18/2020   HA, chills, myalgias, congestion & fever, all symptoms resolved as of 12/29/20 per patient   Depression    Fibromyalgia    GERD 08/27/2008   History of blood transfusion    1995   History of hiatal hernia    hx of has had surgically repaired   History of measles    History of mumps    History of nonmelanoma skin cancer    HYPERLIPIDEMIA 08/27/2008   HYPERTENSION 08/27/2008   pt is currently on no meds    Pneumonia    hx of    PONV (postoperative nausea and vomiting)    Rheumatoid arthritis ([place])    SEBORRHEIC KERATOSIS 08/27/2008   Sinus headache    Vocal cord paresis    hx of    Wears glasses    Wears partial dentures    Past Surgical History:  Procedure Laterality Date   [place]   with appendectomy   BREAST BIOPSY Left 2013   [place]  08/16/2011   Procedure: EXCISION DUCTAL SYSTEM BREAST;  Surgeon: Marcello Moores A. Cornett, MD;  Location: [facility];  Service: General;  Laterality: Left;  left breast duct  excision   CARPAL TUNNEL RELEASE Right 01/01/2021   Procedure: CARPAL TUNNEL RELEASE;  Surgeon: [name], MD;  Location: [facility];  Service: Orthopedics;  Laterality: Right;   CESAREAN SECTION  1980   times 2; 1980 and [place]   COLONOSCOPY  09/15/2018   EXCISION/RELEASE BURSA HIP Right 10/21/2014   Procedure: OPEN RIGHT HIP BURSECTOMY, EXOSECTOMY;  Surgeon: Paralee Cancel, MD;  Location: [facility];  Service: Orthopedics;  Laterality: Right;   foot surgery  2004, 2005, 2008   bilat secondary to neuropathy    JOINT REPLACEMENT  11/2010   lt total knee X2   KNEE ARTHROSCOPY  2007, 2008, 2009   LAPAROSCOPIC GASTRIC SLEEVE RESECTION N/A 10/30/2013   Procedure: LAPAROSCOPIC GASTRIC SLEEVE RESECTION WITH HIATAL HERNIA REPAIR AND UPPER ENDOSCOPY;  Surgeon: Greer Pickerel, MD;  Location: [facility];  Service: General;  Laterality: N/A;   NASAL SINUS SURGERY  2013   OPEN SURGICAL REPAIR OF GLUTEAL TENDON  01/17/2012   Procedure: OPEN SURGICAL REPAIR OF GLUTEAL TENDON;  Surgeon:  Mauri Pole, MD;  Location: [facility];  Service: Orthopedics;  Laterality: Right;   REVERSE SHOULDER ARTHROPLASTY Left 02/18/2022   Procedure: REVERSE SHOULDER ARTHROPLASTY;  Surgeon: Justice Britain, MD;  Location: [facility];  Service: Orthopedics;  Laterality: Left;   ROTATOR CUFF REPAIR Bilateral 2020   2021   TOTAL KNEE ARTHROPLASTY Right 04/15/2020   Procedure: TOTAL KNEE ARTHROPLASTY;  Surgeon: Paralee Cancel, MD;  Location: [facility];  Service: Orthopedics;  Laterality: Right;  70 mins   ULNAR TUNNEL RELEASE Right 01/01/2021   Procedure: CUBITAL TUNNEL RELEASE;  Surgeon: [name], MD;  Location: [facility];  Service: Orthopedics;  Laterality: Right;  with MAC anesthesia needs 45 minutes   Patient Active Problem List   Diagnosis Date Noted   Osteopenia 03/04/2021   Primary osteoarthritis 12/22/2020   Acquired trigger finger of left ring finger  12/09/2020   Pain in right hand 12/09/2020   Hiatal hernia 05/13/2020   Obese 04/16/2020   Osteoarthritis of right knee 04/16/2020   S/P total knee arthroplasty, right 04/15/2020   Pulmonary nodules 12/05/2019   Age-related vocal fold atrophy 09/14/2019   Hoarseness 09/14/2019   Laryngospasms 09/14/2019   Vocal fold paresis, unilateral 09/14/2019   Impingement syndrome of left shoulder region 09/13/2019   Impingement syndrome of right shoulder region 02/10/2019   Bilateral occipital neuralgia 09/26/2017   Arthritis of right knee 03/30/2017   Status post left foot surgery 07/07/2015   Neuroma 05/20/2015   HAV (hallux abducto valgus) 05/20/2015   Hammertoe 05/20/2015   Prominent metatarsal head 05/20/2015   Chronic foot pain 05/20/2015   Anxiety state 03/03/2015   Hip bursitis 10/22/2014   Osteophyte of hip 10/21/2014   Epigastric cramping 11/09/2013   Nausea with vomiting 11/06/2013   Rheumatoid arthritis ([place]) 11/01/2013   S/P laparoscopic sleeve gastrectomy 10/30/2013   Anemia 06/04/2013   CAP (community acquired pneumonia) 02/17/2013   Restless leg syndrome 09/18/2012   Morbid obesity with BMI of 45.0-49.9, adult ([place]) 01/18/2012   Right gluteus tear 01/17/2012   Nipple discharge in female 07/08/2011   Chronic insomnia 06/18/2011   Discharge from nipple 01/06/2011   TMJ PAIN 01/12/2010   DEPRESSION 12/10/2009   SINUSITIS, ACUTE 12/30/2008   Lumbago 12/30/2008   EDEMA 10/03/2008   Hyperlipidemia 08/27/2008   Essential hypertension 08/27/2008   GERD 08/27/2008   REFERRING PROVIDER: Justice Britain MD  REFERRING DIAG: Reverse left total shoulder replacement.  THERAPY DIAG:  Chronic left shoulder pain  Stiffness of left shoulder, not elsewhere classified  Localized edema  Rationale for Evaluation and Treatment: Rehabilitation  ONSET DATE: 02/19/22 (surgery date).  SUBJECTIVE:  SUBJECTIVE STATEMENT: No new complaints.  PERTINENT HISTORY: Fibromyalgia, RA, bilateral RCR, bilateral TKA's, right CTS.    PAIN:  Are you having pain? Yes: NPRS scale: 3/10 Pain location: Left shoulder. Pain description: Sore and ache. Aggravating factors: As above. Relieving factors: As above.  PRECAUTIONS: Other: Per protocol.  No ultrasound.  WEIGHT BEARING RESTRICTIONS: Yes No left UE weight bearing.  FALLS:  Has patient fallen in last 6 months? 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  PLOF: Independent with basic ADLs  PATIENT GOALS:Use left UE without pain.  NEXT MD VISIT:   OBJECTIVE:    PATIENT SURVEYS:  FOTO...Marland KitchenPatient's intake functional measure is 31 on a scale approximating 0 - 100 (higher number = greater function).  TODAY'S TREATMENT:                                                                                                                                         DATE:                                    03-29-22 EXERCISE LOG LT Shldr  Exercise Repetitions and Resistance Comments  Pulleys  6 minutes   Wall ladder 3 minutes   UE Ranger on wall 3 minutes into flexion and circle (CW and CCW).                In supine:  Gentle PROM and Rhythmic stab. For LT shldr  per protocol to patient's left shoulder x 15 minutes.  Patient requesting to skip vaso today.                                    03/16/2022 EXERCISE LOG  Exercise Repetitions and Resistance Comments  [place] X3 min   UE ranger Into flex, mini circles x5 min Rest breaks due to pain and fatigue               Blank cell = exercise not performed today   Manual Therapy Passive ROM: L shoulder, PROM into flexion, ER, IR with gentle  holds at end range  Modalities  Date: 03/16/22 Vaso: Shoulder, Low, 15 mins, Pain  PATIENT EDUCATION: Education details: See below. Person educated: Patient Education method: Explanation, Demonstration, Tactile cues, Verbal cues, and Handouts Education comprehension: verbalized understanding and returned demonstration  HOME EXERCISE PROGRAM: HOME EXERCISE PROGRAM Created by Mali Winfrey Chillemi Jan 10th, 2024 View at [email or web address] using code: [id number] Total 1 Page 1 of 1 WAND EXTERNAL ROTATION - SUPINE ER Lie on your back holding a cane or wand with both hands.  On the affected side,  place a small rolled up towel or pillow under your elbow. Maintain approx. 90 degree bend at the elbow with your arm approximately 30-45 degrees away from your side. GENTLE. PAIN-FREE Use your other arm to pull the wand/cane to rotate the affected arm back into a stretch. Hold and then return to starting position and then repeat. Repeat 10 Times Hold 15 Seconds Complete 1 Set Perform 4-6 Times a Day  ASSESSMENT:  CLINICAL IMPRESSION: Patient arrived today reporting needing to leave early today and no vasopneumatic.  Very good progress per protocol with regards to range of motion per protocol.  OBJECTIVE IMPAIRMENTS: decreased activity tolerance, decreased ROM, increased edema, impaired UE functional use, and pain.   ACTIVITY LIMITATIONS: carrying, lifting, bathing, dressing, and reach over head  PARTICIPATION LIMITATIONS: meal prep, cleaning, laundry, driving, and occupation  PERSONAL FACTORS: Time since onset of injury/illness/exacerbation and 1 comorbidity: prior RCR repair surgery  are also affecting patient's functional outcome.   REHAB POTENTIAL: Excellent  CLINICAL DECISION MAKING: Stable/uncomplicated  EVALUATION COMPLEXITY: Low  GOALS:  LONG TERM GOALS: Target date: 04/06/22. (Per  LTG's per protocol and appropriate timelines).  Ind with HEP. Goal status: MET  2.  Active left  shoulder flexion to 135 degrees+ so the patient can easily reach overhead. Goal status: On-going  3.  Active ER to 70 degrees+ to allow for easily donning/doffing of apparel. Goal status: On-going  4.  Increase shoulder left strength to a solid 4+/5 to increase stability for performance of functional activities. Goal status: On-going  5.  Perform ADL's with pain not > 3/10. Goal status: On-going PLAN:  PT FREQUENCY: 2x/week  PT DURATION: 4 weeks  PLANNED INTERVENTIONS: Therapeutic exercises, Therapeutic activity, Neuromuscular re-education, Patient/Family education, Self Care, Electrical stimulation, Cryotherapy, Moist heat, Vasopneumatic device, and Manual therapy  PLAN FOR NEXT SESSION: Progress per protocol.  Vasopneumatic with pillow between thorax and elbow.  Myca Perno, [place], PT 03/29/2022, 3:04 PM

## 2022-04-01 ENCOUNTER — Encounter: Payer: Medicare Other | Admitting: Physical Therapy

## 2022-04-01 DIAGNOSIS — Z79899 Other long term (current) drug therapy: Secondary | ICD-10-CM | POA: Diagnosis not present

## 2022-04-01 DIAGNOSIS — M0589 Other rheumatoid arthritis with rheumatoid factor of multiple sites: Secondary | ICD-10-CM | POA: Diagnosis not present

## 2022-04-05 ENCOUNTER — Encounter: Payer: Medicare Other | Admitting: Physical Therapy

## 2022-04-08 ENCOUNTER — Other Ambulatory Visit (HOSPITAL_COMMUNITY): Payer: Self-pay

## 2022-04-08 DIAGNOSIS — G894 Chronic pain syndrome: Secondary | ICD-10-CM | POA: Diagnosis not present

## 2022-04-08 DIAGNOSIS — M47812 Spondylosis without myelopathy or radiculopathy, cervical region: Secondary | ICD-10-CM | POA: Diagnosis not present

## 2022-04-08 DIAGNOSIS — K5903 Drug induced constipation: Secondary | ICD-10-CM | POA: Diagnosis not present

## 2022-04-08 DIAGNOSIS — G5603 Carpal tunnel syndrome, bilateral upper limbs: Secondary | ICD-10-CM | POA: Diagnosis not present

## 2022-04-08 MED ORDER — OXYCODONE-ACETAMINOPHEN 5-325 MG PO TABS
1.0000 | ORAL_TABLET | Freq: Three times a day (TID) | ORAL | 0 refills | Status: DC | PRN
  Filled 2022-04-08: qty 90, 30d supply, fill #0

## 2022-04-08 MED ORDER — OXYCODONE-ACETAMINOPHEN 5-325 MG PO TABS
1.0000 | ORAL_TABLET | ORAL | 0 refills | Status: DC | PRN
  Filled 2022-05-14: qty 180, 30d supply, fill #0

## 2022-04-27 ENCOUNTER — Ambulatory Visit (INDEPENDENT_AMBULATORY_CARE_PROVIDER_SITE_OTHER): Payer: Medicare Other | Admitting: Podiatry

## 2022-04-27 ENCOUNTER — Ambulatory Visit: Payer: Medicare Other

## 2022-04-27 DIAGNOSIS — R52 Pain, unspecified: Secondary | ICD-10-CM

## 2022-04-27 DIAGNOSIS — B351 Tinea unguium: Secondary | ICD-10-CM

## 2022-04-27 DIAGNOSIS — M7742 Metatarsalgia, left foot: Secondary | ICD-10-CM

## 2022-04-27 DIAGNOSIS — Z79899 Other long term (current) drug therapy: Secondary | ICD-10-CM | POA: Diagnosis not present

## 2022-04-27 NOTE — Progress Notes (Signed)
Subjective: Chief Complaint  Patient presents with   Foot Pain    Pain located in the left foot, pain located at the great hallux and ball of foot, pain has been ongoing for about 2 month, constant pain    67 year old female presents the office today with the above concerns.  She states that she been getting discomfort to the bottom of her left foot and the ball of her foot is located in the big toe going to the bones and on the fourth 2 months.  It is consistent.  No injuries that she reports.  No radiating pain.  No recent treatment.  She recently just decreased prednisone to 5 mg from 10.  Pain did seem to increase when this was changed.  She is also concerned about nail fungus.  Objective: AAO x3, NAD DP/PT pulses palpable bilaterally, CRT less than 3 seconds Nails appear to be hypertrophic, dystrophic with yellow, brown discoloration.  No edema, erythema or signs of infection. She has discomfort along the area of the plantar aspect left foot with metatarsal heads were.  She also has discomfort on plantar aspect of the hallux.  There is no area pinpoint tenderness.  Flexor, extensor tendons appear to be intact.  No edema.  No erythema or warmth. No pain with calf compression, swelling, warmth, erythema  Assessment: Metatarsalgia; onychomycosis  Plan: -All treatment options discussed with the patient including all alternatives, risks, complications.  -X-rays were obtained reviewed.  3 views of the left foot were obtained.  No evidence of acute fracture.  Status post first MPJ arthrodesis which is healed as well as metatarsal head resections. -Unfortunately upon further steroids.  I dispensed a gel metatarsal pad.  Discussed stretching exercises for Achilles to help take pressure off the forefoot.  Consider custom orthotics to help offload and more of an accommodative type insert. -Discussed treatment options for nail fungus including oral, topical as well as alternative treatments.  At  this time the patient wants to proceed with oral Lamisil.  We discussed side effects of the medication and success rates.  We will check a CBC and LFT prior to starting the medication.  Once I receive the results of this I will then call the medication in.  -Patient encouraged to call the office with any questions, concerns, change in symptoms.

## 2022-04-27 NOTE — Patient Instructions (Addendum)
Plantar Fasciitis (Heel Spur Syndrome) with Rehab The plantar fascia is a fibrous, ligament-like, soft-tissue structure that spans the bottom of the foot. Plantar fasciitis is a condition that causes pain in the foot due to inflammation of the tissue. SYMPTOMS   Pain and tenderness on the underneath side of the foot.  Pain that worsens with standing or walking. CAUSES  Plantar fasciitis is caused by irritation and injury to the plantar fascia on the underneath side of the foot. Common mechanisms of injury include:  Direct trauma to bottom of the foot.  Damage to a small nerve that runs under the foot where the main fascia attaches to the heel bone.  Stress placed on the plantar fascia due to bone spurs. RISK INCREASES WITH:   Activities that place stress on the plantar fascia (running, jumping, pivoting, or cutting).  Poor strength and flexibility.  Improperly fitted shoes.  Tight calf muscles.  Flat feet.  Failure to warm-up properly before activity.  Obesity. PREVENTION  Warm up and stretch properly before activity.  Allow for adequate recovery between workouts.  Maintain physical fitness:  Strength, flexibility, and endurance.  Cardiovascular fitness.  Maintain a health body weight.  Avoid stress on the plantar fascia.  Wear properly fitted shoes, including arch supports for individuals who have flat feet.  PROGNOSIS  If treated properly, then the symptoms of plantar fasciitis usually resolve without surgery. However, occasionally surgery is necessary.  RELATED COMPLICATIONS   Recurrent symptoms that may result in a chronic condition.  Problems of the lower back that are caused by compensating for the injury, such as limping.  Pain or weakness of the foot during push-off following surgery.  Chronic inflammation, scarring, and partial or complete fascia tear, occurring more often from repeated injections.  TREATMENT  Treatment initially involves the  use of ice and medication to help reduce pain and inflammation. The use of strengthening and stretching exercises may help reduce pain with activity, especially stretches of the Achilles tendon. These exercises may be performed at home or with a therapist. Your caregiver may recommend that you use heel cups of arch supports to help reduce stress on the plantar fascia. Occasionally, corticosteroid injections are given to reduce inflammation. If symptoms persist for greater than 6 months despite non-surgical (conservative), then surgery may be recommended.   MEDICATION   If pain medication is necessary, then nonsteroidal anti-inflammatory medications, such as aspirin and ibuprofen, or other minor pain relievers, such as acetaminophen, are often recommended.  Do not take pain medication within 7 days before surgery.  Prescription pain relievers may be given if deemed necessary by your caregiver. Use only as directed and only as much as you need.  Corticosteroid injections may be given by your caregiver. These injections should be reserved for the most serious cases, because they may only be given a certain number of times.  HEAT AND COLD  Cold treatment (icing) relieves pain and reduces inflammation. Cold treatment should be applied for 10 to 15 minutes every 2 to 3 hours for inflammation and pain and immediately after any activity that aggravates your symptoms. Use ice packs or massage the area with a piece of ice (ice massage).  Heat treatment may be used prior to performing the stretching and strengthening activities prescribed by your caregiver, physical therapist, or athletic trainer. Use a heat pack or soak the injury in warm water.  SEEK IMMEDIATE MEDICAL CARE IF:  Treatment seems to offer no benefit, or the condition worsens.  Any medications   produce adverse side effects.  EXERCISES- RANGE OF MOTION (ROM) AND STRETCHING EXERCISES - Plantar Fasciitis (Heel Spur Syndrome) These exercises  may help you when beginning to rehabilitate your injury. Your symptoms may resolve with or without further involvement from your physician, physical therapist or athletic trainer. While completing these exercises, remember:   Restoring tissue flexibility helps normal motion to return to the joints. This allows healthier, less painful movement and activity.  An effective stretch should be held for at least 30 seconds.  A stretch should never be painful. You should only feel a gentle lengthening or release in the stretched tissue.  RANGE OF MOTION - Toe Extension, Flexion  Sit with your right / left leg crossed over your opposite knee.  Grasp your toes and gently pull them back toward the top of your foot. You should feel a stretch on the bottom of your toes and/or foot.  Hold this stretch for 10 seconds.  Now, gently pull your toes toward the bottom of your foot. You should feel a stretch on the top of your toes and or foot.  Hold this stretch for 10 seconds. Repeat  times. Complete this stretch 3 times per day.   RANGE OF MOTION - Ankle Dorsiflexion, Active Assisted  Remove shoes and sit on a chair that is preferably not on a carpeted surface.  Place right / left foot under knee. Extend your opposite leg for support.  Keeping your heel down, slide your right / left foot back toward the chair until you feel a stretch at your ankle or calf. If you do not feel a stretch, slide your bottom forward to the edge of the chair, while still keeping your heel down.  Hold this stretch for 10 seconds. Repeat 3 times. Complete this stretch 2 times per day.   STRETCH  Gastroc, Standing  Place hands on wall.  Extend right / left leg, keeping the front knee somewhat bent.  Slightly point your toes inward on your back foot.  Keeping your right / left heel on the floor and your knee straight, shift your weight toward the wall, not allowing your back to arch.  You should feel a gentle stretch  in the right / left calf. Hold this position for 10 seconds. Repeat 3 times. Complete this stretch 2 times per day.  STRETCH  Soleus, Standing  Place hands on wall.  Extend right / left leg, keeping the other knee somewhat bent.  Slightly point your toes inward on your back foot.  Keep your right / left heel on the floor, bend your back knee, and slightly shift your weight over the back leg so that you feel a gentle stretch deep in your back calf.  Hold this position for 10 seconds. Repeat 3 times. Complete this stretch 2 times per day.  STRETCH  Gastrocsoleus, Standing  Note: This exercise can place a lot of stress on your foot and ankle. Please complete this exercise only if specifically instructed by your caregiver.   Place the ball of your right / left foot on a step, keeping your other foot firmly on the same step.  Hold on to the wall or a rail for balance.  Slowly lift your other foot, allowing your body weight to press your heel down over the edge of the step.  You should feel a stretch in your right / left calf.  Hold this position for 10 seconds.  Repeat this exercise with a slight bend in your right /   gain both the endurance and the strength needed for everyday activities through controlled exercises. Complete these exercises as instructed by your physician, physical therapist or athletic trainer. Progress the resistance and repetitions only as guided.  STRENGTH - Towel Curls Sit in a chair positioned on a non-carpeted surface. Place your foot on a towel, keeping your heel on the floor. Pull the towel toward your heel by only curling your toes. Keep your heel on the floor. Repeat 3 times.  Complete this exercise 2 times per day.  STRENGTH - Ankle Inversion Secure one end of a rubber exercise band/tubing to a fixed object (table, pole). Loop the other end around your foot just before your toes. Place your fists between your knees. This will focus your strengthening at your ankle. Slowly, pull your big toe up and in, making sure the band/tubing is positioned to resist the entire motion. Hold this position for 10 seconds. Have your muscles resist the band/tubing as it slowly pulls your foot back to the starting position. Repeat 3 times. Complete this exercises 2 times per day.  Document Released: 02/08/2005 Document Revised: 05/03/2011 Document Reviewed: 05/23/2008 Exit[employer] Patient Information 2014 Exit[employer], [place].  ----   Terbinafine Tablets What is this medication? TERBINAFINE (TER bin a feen) treats fungal infections of the nails. It belongs to a group of medications called antifungals. It will not treat infections caused by bacteria or viruses. This medicine may be used for other purposes; ask your health care provider or pharmacist if you have questions. COMMON BRAND NAME(S): Lamisil, Terbinex What should I tell my care team before I take this medication? They need to know if you have any of these conditions: Liver disease An unusual or allergic reaction to terbinafine, other medications, foods, dyes, or preservatives Pregnant or trying to get pregnant Breast-feeding How should I use this medication? Take this medication by mouth with water. Take it as directed on the prescription label at the same time every day. You can take it with or without food. If it upsets your stomach, take it with food. Keep taking it unless your care team tells you to stop. A special MedGuide will be given to you by the pharmacist with each prescription and refill. Be sure to read this information carefully each time. Talk to your care team regarding the use of this medication in children.  Special care may be needed. Overdosage: If you think you have taken too much of this medicine contact a poison control center or emergency room at once. NOTE: This medicine is only for you. Do not share this medicine with others. What if I miss a dose? If you miss a dose, take it as soon as you can unless it is more than 4 hours late. If it is more than 4 hours late, skip the missed dose. Take the next dose at the normal time. What may interact with this medication? Do not take this medication with any of the following: Pimozide Thioridazine This medication may also interact with the following: Beta blockers Caffeine Certain medications for mental health conditions Cimetidine Cyclosporine Medications for fungal infections like fluconazole and ketoconazole Medications for irregular heartbeat like amiodarone, flecainide and propafenone Rifampin Warfarin This list may not describe all possible interactions. Give your health care provider a list of all the medicines, herbs, non-prescription drugs, or dietary supplements you use. Also tell them if you smoke, drink alcohol, or use illegal drugs. Some items may interact with your medicine. What should I watch for while using  this medication? Visit your care team for regular checks on your progress. You may need blood work while you are taking this medication. It may be some time before you see the benefit from this medication. This medication may cause serious skin reactions. They can happen weeks to months after starting the medication. Contact your care team right away if you notice fevers or flu-like symptoms with a rash. The rash may be red or purple and then turn into blisters or peeling of the skin. Or, you might notice a red rash with swelling of the face, lips or lymph nodes in your neck or under your arms. This medication can make you more sensitive to the sun. Keep out of the sun, If you cannot avoid being in the sun, wear protective  clothing and sunscreen. Do not use sun lamps or tanning beds/booths. What side effects may I notice from receiving this medication? Side effects that you should report to your care team as soon as possible: Allergic reactions--skin rash, itching, hives, swelling of the face, lips, tongue, or throat Change in sense of smell Change in taste Infection--fever, chills, cough, or sore throat Liver injury--right upper belly pain, loss of appetite, nausea, light-colored stool, dark yellow or brown urine, yellowing skin or eyes, unusual weakness or fatigue Low red blood cell level--unusual weakness or fatigue, dizziness, headache, trouble breathing Lupus-like syndrome--joint pain, swelling, or stiffness, butterfly-shaped rash on the face, rashes that get worse in the sun, fever, unusual weakness or fatigue Rash, fever, and swollen lymph nodes Redness, blistering, peeling, or loosening of the skin, including inside the mouth Unusual bruising or bleeding Worsening mood, feelings of depression Side effects that usually do not require medical attention (report to your care team if they continue or are bothersome): Diarrhea Gas Headache Nausea Stomach pain Upset stomach This list may not describe all possible side effects. Call your doctor for medical advice about side effects. You may report side effects to FDA at 1-800-FDA-1088. Where should I keep my medication? Keep out of the reach of children and pets. Store between 20 and 25 degrees C (68 and 77 degrees F). Protect from light. Get rid of any unused medication after the expiration date. To get rid of medications that are no longer needed or have expired: Take the medication to a medication take-back program. Check with your pharmacy or law enforcement to find a location. If you cannot return the medication, check the label or package insert to see if the medication should be thrown out in the garbage or flushed down the toilet. If you are not  sure, ask your care team. If it is safe to put it in the trash, take the medication out of the container. Mix the medication with cat litter, dirt, coffee grounds, or other unwanted substance. Seal the mixture in a bag or container. Put it in the trash. NOTE: This sheet is a summary. It may not cover all possible information. If you have questions about this medicine, talk to your doctor, pharmacist, or health care provider.  2023 Elsevier/Gold Standard (2020-09-02 00:00:00)

## 2022-04-29 DIAGNOSIS — M0589 Other rheumatoid arthritis with rheumatoid factor of multiple sites: Secondary | ICD-10-CM | POA: Diagnosis not present

## 2022-05-05 DIAGNOSIS — Z79899 Other long term (current) drug therapy: Secondary | ICD-10-CM | POA: Diagnosis not present

## 2022-05-06 ENCOUNTER — Other Ambulatory Visit: Payer: Self-pay | Admitting: Podiatry

## 2022-05-06 DIAGNOSIS — Z79899 Other long term (current) drug therapy: Secondary | ICD-10-CM

## 2022-05-06 LAB — HEPATIC FUNCTION PANEL
AG Ratio: 2.1 (calc) (ref 1.0–2.5)
ALT: 15 U/L (ref 6–29)
AST: 23 U/L (ref 10–35)
Albumin: 4 g/dL (ref 3.6–5.1)
Alkaline phosphatase (APISO): 90 U/L (ref 37–153)
Bilirubin, Direct: 0.1 mg/dL (ref 0.0–0.2)
Globulin: 1.9 g/dL (calc) (ref 1.9–3.7)
Indirect Bilirubin: 0.3 mg/dL (calc) (ref 0.2–1.2)
Total Bilirubin: 0.4 mg/dL (ref 0.2–1.2)
Total Protein: 5.9 g/dL — ABNORMAL LOW (ref 6.1–8.1)

## 2022-05-06 LAB — CBC WITH DIFFERENTIAL/PLATELET
Absolute Monocytes: 520 cells/uL (ref 200–950)
Basophils Absolute: 20 cells/uL (ref 0–200)
Basophils Relative: 0.3 %
Eosinophils Absolute: 39 cells/uL (ref 15–500)
Eosinophils Relative: 0.6 %
HCT: 34.5 % — ABNORMAL LOW (ref 35.0–45.0)
Hemoglobin: 11.1 g/dL — ABNORMAL LOW (ref 11.7–15.5)
Lymphs Abs: 423 cells/uL — ABNORMAL LOW (ref 850–3900)
MCH: 28.8 pg (ref 27.0–33.0)
MCHC: 32.2 g/dL (ref 32.0–36.0)
MCV: 89.4 fL (ref 80.0–100.0)
MPV: 9.5 fL (ref 7.5–12.5)
Monocytes Relative: 8 %
Neutro Abs: 5499 cells/uL (ref 1500–7800)
Neutrophils Relative %: 84.6 %
Platelets: 349 10*3/uL (ref 140–400)
RBC: 3.86 10*6/uL (ref 3.80–5.10)
RDW: 13.6 % (ref 11.0–15.0)
Total Lymphocyte: 6.5 %
WBC: 6.5 10*3/uL (ref 3.8–10.8)

## 2022-05-06 MED ORDER — TERBINAFINE HCL 250 MG PO TABS: 250.0000 mg | ORAL_TABLET | Freq: Every day | ORAL | 0 refills | Status: DC

## 2022-05-13 DIAGNOSIS — L570 Actinic keratosis: Secondary | ICD-10-CM | POA: Diagnosis not present

## 2022-05-13 DIAGNOSIS — D045 Carcinoma in situ of skin of trunk: Secondary | ICD-10-CM | POA: Diagnosis not present

## 2022-05-13 DIAGNOSIS — D485 Neoplasm of uncertain behavior of skin: Secondary | ICD-10-CM | POA: Diagnosis not present

## 2022-05-13 DIAGNOSIS — Z85828 Personal history of other malignant neoplasm of skin: Secondary | ICD-10-CM | POA: Diagnosis not present

## 2022-05-13 DIAGNOSIS — D2272 Melanocytic nevi of left lower limb, including hip: Secondary | ICD-10-CM | POA: Diagnosis not present

## 2022-05-13 DIAGNOSIS — D225 Melanocytic nevi of trunk: Secondary | ICD-10-CM | POA: Diagnosis not present

## 2022-05-13 DIAGNOSIS — D1801 Hemangioma of skin and subcutaneous tissue: Secondary | ICD-10-CM | POA: Diagnosis not present

## 2022-05-13 DIAGNOSIS — D2271 Melanocytic nevi of right lower limb, including hip: Secondary | ICD-10-CM | POA: Diagnosis not present

## 2022-05-13 DIAGNOSIS — D2372 Other benign neoplasm of skin of left lower limb, including hip: Secondary | ICD-10-CM | POA: Diagnosis not present

## 2022-05-13 DIAGNOSIS — L821 Other seborrheic keratosis: Secondary | ICD-10-CM | POA: Diagnosis not present

## 2022-05-14 ENCOUNTER — Other Ambulatory Visit (HOSPITAL_COMMUNITY): Payer: Self-pay

## 2022-05-17 ENCOUNTER — Other Ambulatory Visit (HOSPITAL_COMMUNITY): Payer: Self-pay

## 2022-05-31 DIAGNOSIS — M0589 Other rheumatoid arthritis with rheumatoid factor of multiple sites: Secondary | ICD-10-CM | POA: Diagnosis not present

## 2022-05-31 DIAGNOSIS — Z79899 Other long term (current) drug therapy: Secondary | ICD-10-CM | POA: Diagnosis not present

## 2022-06-01 ENCOUNTER — Ambulatory Visit (HOSPITAL_BASED_OUTPATIENT_CLINIC_OR_DEPARTMENT_OTHER)
Admission: RE | Admit: 2022-06-01 | Discharge: 2022-06-01 | Disposition: A | Discharge status: Home / Self Care | Payer: Medicare Other | Source: Ambulatory Visit | Attending: Family Medicine | Admitting: Family Medicine

## 2022-06-01 DIAGNOSIS — R911 Solitary pulmonary nodule: Secondary | ICD-10-CM | POA: Diagnosis not present

## 2022-06-01 DIAGNOSIS — J479 Bronchiectasis, uncomplicated: Secondary | ICD-10-CM | POA: Diagnosis not present

## 2022-06-01 LAB — POCT I-STAT CREATININE: Creatinine, Ser: 0.6 mg/dL (ref 0.44–1.00)

## 2022-06-01 MED ORDER — IOHEXOL 300 MG/ML  SOLN
75.0000 mL | Freq: Once | INTRAMUSCULAR | Status: AC | PRN
  Administered 2022-06-01: 75 mL via INTRAVENOUS

## 2022-06-02 DIAGNOSIS — M25511 Pain in right shoulder: Secondary | ICD-10-CM | POA: Diagnosis not present

## 2022-06-02 DIAGNOSIS — Z96612 Presence of left artificial shoulder joint: Secondary | ICD-10-CM | POA: Diagnosis not present

## 2022-06-03 DIAGNOSIS — K5903 Drug induced constipation: Secondary | ICD-10-CM | POA: Diagnosis not present

## 2022-06-03 DIAGNOSIS — G894 Chronic pain syndrome: Secondary | ICD-10-CM | POA: Diagnosis not present

## 2022-06-03 DIAGNOSIS — M47812 Spondylosis without myelopathy or radiculopathy, cervical region: Secondary | ICD-10-CM | POA: Diagnosis not present

## 2022-06-03 DIAGNOSIS — G5603 Carpal tunnel syndrome, bilateral upper limbs: Secondary | ICD-10-CM | POA: Diagnosis not present

## 2022-06-16 ENCOUNTER — Other Ambulatory Visit: Payer: Self-pay

## 2022-06-16 ENCOUNTER — Other Ambulatory Visit (HOSPITAL_COMMUNITY): Payer: Self-pay

## 2022-06-16 MED ORDER — OXYCODONE-ACETAMINOPHEN 5-325 MG PO TABS
1.0000 | ORAL_TABLET | Freq: Three times a day (TID) | ORAL | 0 refills | Status: DC | PRN
  Filled 2022-06-16: qty 90, 30d supply, fill #0

## 2022-06-16 MED ORDER — OXYCODONE-ACETAMINOPHEN 5-325 MG PO TABS
1.0000 | ORAL_TABLET | ORAL | 0 refills | Status: DC | PRN
  Filled 2022-07-20: qty 180, 30d supply, fill #0

## 2022-06-20 ENCOUNTER — Encounter: Payer: Self-pay | Admitting: Family Medicine

## 2022-06-21 ENCOUNTER — Other Ambulatory Visit: Payer: Self-pay | Admitting: Family Medicine

## 2022-06-22 MED ORDER — ZOLPIDEM TARTRATE 10 MG PO TABS: 10.0000 mg | ORAL_TABLET | Freq: Every evening | ORAL | 0 refills | Status: DC | PRN

## 2022-07-01 ENCOUNTER — Ambulatory Visit (INDEPENDENT_AMBULATORY_CARE_PROVIDER_SITE_OTHER): Payer: Medicare Other | Admitting: Podiatry

## 2022-07-01 DIAGNOSIS — M778 Other enthesopathies, not elsewhere classified: Secondary | ICD-10-CM | POA: Diagnosis not present

## 2022-07-01 MED ORDER — TRIAMCINOLONE ACETONIDE 10 MG/ML IJ SUSP
10.0000 mg | Freq: Once | INTRAMUSCULAR | Status: AC
  Administered 2022-07-01: 10 mg

## 2022-07-01 NOTE — Patient Instructions (Signed)

## 2022-07-07 NOTE — Progress Notes (Signed)
Subjective: Chief Complaint  Patient presents with   Follow-up    Metatarsalgia left foot. Patient continues to experience pain. Patient has been using the gel pad and patient has a pair of orthotics that were made back in 2020 but the patient stated they are to hard and her feet hurt after using them.     67 year old female presents the office today with the above concerns.  She is still having pain is doing about the same.  No recent injury or changes otherwise.  Objective: AAO x3, NAD DP/PT pulses palpable bilaterally, CRT less than 3 seconds Nails appear to be hypertrophic, dystrophic with yellow, brown discoloration.  No edema, erythema or signs of infection- overall the same She has discomfort along the area of the plantar aspect left foot with metatarsal heads were, mostly along the 2nd.  She also has discomfort on plantar aspect of the hallux.  There is no area pinpoint tenderness.  Flexor, extensor tendons appear to be intact.  No edema.  No erythema or warmth. No pain with calf compression, swelling, warmth, erythema  Assessment: Metatarsalgia; onychomycosis  Plan: -All treatment options discussed with the patient including all alternatives, risks, complications.  -We discussed steroid injection she wishes to proceed with this time.  Cleaned the skin with alcohol.  Mixture 1 mL Kenalog 10, 0.5 ml of Marcaine plain, 0.5 mL of lidocaine plain was infiltrated into the area of tenderness mostly along the where the second MPJ was around this area.  She tolerated the injection well without any complications.  Postinjection care discussed. -Her orthotics are very rigid.  Will try to modify this to help and do a different top-cover.  No follow-ups on file.

## 2022-07-08 DIAGNOSIS — R5383 Other fatigue: Secondary | ICD-10-CM | POA: Diagnosis not present

## 2022-07-08 DIAGNOSIS — M0589 Other rheumatoid arthritis with rheumatoid factor of multiple sites: Secondary | ICD-10-CM | POA: Diagnosis not present

## 2022-07-08 DIAGNOSIS — Z79899 Other long term (current) drug therapy: Secondary | ICD-10-CM | POA: Diagnosis not present

## 2022-07-20 ENCOUNTER — Other Ambulatory Visit: Payer: Self-pay

## 2022-07-20 ENCOUNTER — Other Ambulatory Visit (HOSPITAL_COMMUNITY): Payer: Self-pay

## 2022-07-26 ENCOUNTER — Encounter: Payer: Self-pay | Admitting: Family Medicine

## 2022-07-27 ENCOUNTER — Other Ambulatory Visit: Payer: Self-pay | Admitting: Family Medicine

## 2022-07-27 NOTE — Telephone Encounter (Signed)
Error/njr °

## 2022-07-29 ENCOUNTER — Other Ambulatory Visit (HOSPITAL_COMMUNITY): Payer: Self-pay

## 2022-07-29 ENCOUNTER — Encounter: Payer: Self-pay | Admitting: Podiatry

## 2022-07-29 DIAGNOSIS — G894 Chronic pain syndrome: Secondary | ICD-10-CM | POA: Diagnosis not present

## 2022-07-29 DIAGNOSIS — K5903 Drug induced constipation: Secondary | ICD-10-CM | POA: Diagnosis not present

## 2022-07-29 DIAGNOSIS — M47812 Spondylosis without myelopathy or radiculopathy, cervical region: Secondary | ICD-10-CM | POA: Diagnosis not present

## 2022-07-29 DIAGNOSIS — G5603 Carpal tunnel syndrome, bilateral upper limbs: Secondary | ICD-10-CM | POA: Diagnosis not present

## 2022-07-29 MED ORDER — OXYCODONE-ACETAMINOPHEN 5-325 MG PO TABS
1.0000 | ORAL_TABLET | ORAL | 0 refills | Status: DC | PRN
  Filled 2022-08-25: qty 180, 30d supply, fill #0

## 2022-07-29 MED ORDER — OXYCODONE-ACETAMINOPHEN 5-325 MG PO TABS: 1.0000 | ORAL_TABLET | ORAL | 0 refills | Status: DC | PRN

## 2022-07-30 ENCOUNTER — Other Ambulatory Visit (HOSPITAL_COMMUNITY): Payer: Self-pay

## 2022-08-05 DIAGNOSIS — M0589 Other rheumatoid arthritis with rheumatoid factor of multiple sites: Secondary | ICD-10-CM | POA: Diagnosis not present

## 2022-08-25 ENCOUNTER — Other Ambulatory Visit (HOSPITAL_COMMUNITY): Payer: Self-pay

## 2022-08-31 ENCOUNTER — Encounter: Payer: Self-pay | Admitting: Podiatry

## 2022-09-02 DIAGNOSIS — M0589 Other rheumatoid arthritis with rheumatoid factor of multiple sites: Secondary | ICD-10-CM | POA: Diagnosis not present

## 2022-09-07 ENCOUNTER — Telehealth: Payer: Self-pay | Admitting: Podiatry

## 2022-09-07 NOTE — Telephone Encounter (Signed)
Called pt and left message apologizing but the box of orthotics that was to be sent back for adjustment/refurbish was lost in the mail and we need to get her back in to be rescanned for new orthotics.  I asked pt to call to schedule that appt.

## 2022-09-13 ENCOUNTER — Other Ambulatory Visit: Payer: Medicare Other

## 2022-09-16 ENCOUNTER — Other Ambulatory Visit: Payer: Self-pay | Admitting: Internal Medicine

## 2022-09-21 ENCOUNTER — Ambulatory Visit: Payer: Medicare Other

## 2022-09-21 DIAGNOSIS — Z6836 Body mass index (BMI) 36.0-36.9, adult: Secondary | ICD-10-CM | POA: Diagnosis not present

## 2022-09-21 DIAGNOSIS — G5603 Carpal tunnel syndrome, bilateral upper limbs: Secondary | ICD-10-CM | POA: Diagnosis not present

## 2022-09-21 DIAGNOSIS — R110 Nausea: Secondary | ICD-10-CM | POA: Diagnosis not present

## 2022-09-21 DIAGNOSIS — M1991 Primary osteoarthritis, unspecified site: Secondary | ICD-10-CM | POA: Diagnosis not present

## 2022-09-21 DIAGNOSIS — M0589 Other rheumatoid arthritis with rheumatoid factor of multiple sites: Secondary | ICD-10-CM | POA: Diagnosis not present

## 2022-09-21 DIAGNOSIS — M79641 Pain in right hand: Secondary | ICD-10-CM | POA: Diagnosis not present

## 2022-09-21 DIAGNOSIS — M797 Fibromyalgia: Secondary | ICD-10-CM | POA: Diagnosis not present

## 2022-09-21 DIAGNOSIS — E669 Obesity, unspecified: Secondary | ICD-10-CM | POA: Diagnosis not present

## 2022-09-21 NOTE — Progress Notes (Signed)
Patient was present and re-scanned for new CFO's as we lost her in transit when sending for modifications  Patient needs accommodative support as last pair were too rigid she says metatarsalgia present with tenderness   Items ordered and will be fit upon receipt  [name] CPed, CFo, CFm

## 2022-09-27 ENCOUNTER — Other Ambulatory Visit (HOSPITAL_COMMUNITY): Payer: Self-pay

## 2022-09-27 DIAGNOSIS — K5903 Drug induced constipation: Secondary | ICD-10-CM | POA: Diagnosis not present

## 2022-09-27 DIAGNOSIS — Z79891 Long term (current) use of opiate analgesic: Secondary | ICD-10-CM | POA: Diagnosis not present

## 2022-09-27 DIAGNOSIS — G894 Chronic pain syndrome: Secondary | ICD-10-CM | POA: Diagnosis not present

## 2022-09-27 DIAGNOSIS — G5603 Carpal tunnel syndrome, bilateral upper limbs: Secondary | ICD-10-CM | POA: Diagnosis not present

## 2022-09-27 DIAGNOSIS — M47812 Spondylosis without myelopathy or radiculopathy, cervical region: Secondary | ICD-10-CM | POA: Diagnosis not present

## 2022-09-27 MED ORDER — OXYCODONE-ACETAMINOPHEN 5-325 MG PO TABS
1.0000 | ORAL_TABLET | ORAL | 0 refills | Status: DC | PRN
  Filled 2022-10-29 (×2): qty 180, 30d supply, fill #0

## 2022-09-27 MED ORDER — OXYCODONE-ACETAMINOPHEN 5-325 MG PO TABS
1.0000 | ORAL_TABLET | ORAL | 0 refills | Status: DC | PRN
  Filled 2022-09-27: qty 180, 30d supply, fill #0

## 2022-09-28 ENCOUNTER — Other Ambulatory Visit (HOSPITAL_COMMUNITY): Payer: Self-pay

## 2022-09-28 ENCOUNTER — Other Ambulatory Visit: Payer: Self-pay | Admitting: Family Medicine

## 2022-10-04 DIAGNOSIS — M0589 Other rheumatoid arthritis with rheumatoid factor of multiple sites: Secondary | ICD-10-CM | POA: Diagnosis not present

## 2022-10-08 ENCOUNTER — Telehealth: Payer: Self-pay | Admitting: Podiatry

## 2022-10-08 NOTE — Telephone Encounter (Signed)
Left message with husband for pt to call back to set up appt to pick up orthotics

## 2022-10-27 ENCOUNTER — Ambulatory Visit (INDEPENDENT_AMBULATORY_CARE_PROVIDER_SITE_OTHER): Payer: Medicare Other | Admitting: Family Medicine

## 2022-10-27 ENCOUNTER — Encounter: Payer: Self-pay | Admitting: Family Medicine

## 2022-10-27 VITALS — BP 160/86 | HR 70 | Temp 98.3°F | Ht 64.0 in | Wt 216.4 lb

## 2022-10-27 DIAGNOSIS — H539 Unspecified visual disturbance: Secondary | ICD-10-CM

## 2022-10-27 DIAGNOSIS — E785 Hyperlipidemia, unspecified: Secondary | ICD-10-CM | POA: Diagnosis not present

## 2022-10-27 DIAGNOSIS — I10 Essential (primary) hypertension: Secondary | ICD-10-CM | POA: Diagnosis not present

## 2022-10-27 MED ORDER — AMLODIPINE BESYLATE 5 MG PO TABS: 5.0000 mg | ORAL_TABLET | Freq: Every day | ORAL | 5 refills | Status: DC

## 2022-10-27 NOTE — Progress Notes (Signed)
Established Patient Office Visit  Subjective   Patient ID: [name], female    DOB: Apr 06, 1955  Age: 67 y.o. MRN: [id number]  Chief Complaint  Patient presents with   Blurred Vision    Patient complains of blurred vision,     HPI   Duncan Dull is seen with some recent transient episodes of blurred vision right eye.  Chronic problems include history of hypertension, GERD, occipital neuralgia, recurrent sinusitis, rheumatoid arthritis.  She states about 2 weeks ago she noted first episode of transient blurred vision right eye lasting about 3 seconds.  She has had a few more episodes since then with most recent episode occurring about 3 days ago.  Each episode lasted only seconds.  Denies any recent speech change, chest pain, focal weakness, confusion.  She went to ophthalmologist and had full eye exam.  Ophthalmologist had recommended carotid Dopplers and MRI brain to further assess.  Patient has no history of known cerebrovascular disease.  She has had some bilateral headaches mostly frontal.  Saw her rheumatologist recently and just couple weeks ago had sed rate of 36.  She takes lisinopril 10 mg daily for hypertension.  She has had multiple elevated readings recently around 160s systolic.  She is a non-smoker.  No history of diabetes.  Does have hyperlipidemia which is treated with rosuvastatin 20 mg daily.  Needs follow-up lipid panel.  Past Medical History:  Diagnosis Date   ANGIOMA 10/03/2008   REMOVED FROM RIGHT LOWER LEG-BENIGN   Anxiety    Aortic atherosclerosis (HCC)    Arthritis    RA   Bronchitis    hx of    Cancer (HCC)    BASAL CELL SKIN CANCER   Complication of anesthesia    Coronary artery disease    COVID-19 06/18/2020   HA, chills, myalgias, congestion & fever, all symptoms resolved as of 12/29/20 per patient   Depression    Fibromyalgia    GERD 08/27/2008   History of blood transfusion    1995   History of hiatal hernia    hx of has had surgically repaired    History of measles    History of mumps    History of nonmelanoma skin cancer    HYPERLIPIDEMIA 08/27/2008   HYPERTENSION 08/27/2008   pt is currently on no meds    Pneumonia    hx of    PONV (postoperative nausea and vomiting)    Rheumatoid arthritis (HCC)    SEBORRHEIC KERATOSIS 08/27/2008   Sinus headache    Vocal cord paresis    hx of    Wears glasses    Wears partial dentures    Past Surgical History:  Procedure Laterality Date   ABDOMINAL HYSTERECTOMY  1995   with appendectomy   BREAST BIOPSY Left 2013   BREAST DUCTAL SYSTEM EXCISION  08/16/2011   Procedure: EXCISION DUCTAL SYSTEM BREAST;  Surgeon: Maisie Fus A. Cornett, MD;  Location: [facility];  Service: General;  Laterality: Left;  left breast duct excision   CARPAL TUNNEL RELEASE Right 01/01/2021   Procedure: CARPAL TUNNEL RELEASE;  Surgeon: Gomez Cleverly, MD;  Location: [facility];  Service: Orthopedics;  Laterality: Right;   CESAREAN SECTION  1980   times 2; 1980 and 1982   CESAREAN SECTION  1982   CHOLECYSTECTOMY  1997   COLONOSCOPY  09/15/2018   EXCISION/RELEASE BURSA HIP Right 10/21/2014   Procedure: OPEN RIGHT HIP BURSECTOMY, EXOSECTOMY;  Surgeon: Durene Romans, MD;  Location: [facility];  Service: Orthopedics;  Laterality: Right;   foot surgery  2004, 2005, 2008   bilat secondary to neuropathy    JOINT REPLACEMENT  11/2010   lt total knee X2   KNEE ARTHROSCOPY  2007, 2008, 2009   LAPAROSCOPIC GASTRIC SLEEVE RESECTION N/A 10/30/2013   Procedure: LAPAROSCOPIC GASTRIC SLEEVE RESECTION WITH HIATAL HERNIA REPAIR AND UPPER ENDOSCOPY;  Surgeon: Gaynelle Adu, MD;  Location: [facility];  Service: General;  Laterality: N/A;   NASAL SINUS SURGERY  2013   OPEN SURGICAL REPAIR OF GLUTEAL TENDON  01/17/2012   Procedure: OPEN SURGICAL REPAIR OF GLUTEAL TENDON;  Surgeon: Shelda Pal, MD;  Location: [facility];  Service: Orthopedics;  Laterality: Right;   REVERSE SHOULDER ARTHROPLASTY Left 02/18/2022    Procedure: REVERSE SHOULDER ARTHROPLASTY;  Surgeon: Francena Hanly, MD;  Location: [facility];  Service: Orthopedics;  Laterality: Left;   ROTATOR CUFF REPAIR Bilateral 2020   2021   TOTAL KNEE ARTHROPLASTY Right 04/15/2020   Procedure: TOTAL KNEE ARTHROPLASTY;  Surgeon: Durene Romans, MD;  Location: [facility];  Service: Orthopedics;  Laterality: Right;  70 mins   ULNAR TUNNEL RELEASE Right 01/01/2021   Procedure: CUBITAL TUNNEL RELEASE;  Surgeon: Gomez Cleverly, MD;  Location: [facility];  Service: Orthopedics;  Laterality: Right;  with MAC anesthesia needs 45 minutes    reports that she quit smoking about 38 years ago. Her smoking use included cigarettes. She started smoking about 42 years ago. She has a 1 pack-year smoking history. She has never used smokeless tobacco. She reports that she does not drink alcohol and does not use drugs. family history includes Breast cancer (age of onset: 39) in her mother; Cancer in her mother; Diabetes in her son; Epilepsy in her father; Heart attack in her sister and sister; Kidney disease in her father. Allergies  Allergen Reactions   Bee Venom Anaphylaxis   Keflex [Cephalexin] Hives    Pt took po kelfex for sinus infection, developed blisters over entire body   Codeine Sulfate Other (See Comments)    GI upset   Penicillamine Other (See Comments)   Penicillins Rash    Review of Systems  Constitutional:  Negative for malaise/fatigue and weight loss.  Eyes:  Positive for blurred vision.       See HPI.  No current symptoms.  No recent diplopia.  Respiratory:  Negative for shortness of breath.   Cardiovascular:  Negative for chest pain and leg swelling.  Neurological:  Negative for dizziness, tremors, sensory change, speech change, focal weakness, seizures, loss of consciousness, weakness and headaches.      Objective:     BP (!) 160/86 (BP Location: Left Arm, Patient Position: Sitting, Cuff Size: Large)   Pulse 70   Temp 98.3 F (36.8  C) (Oral)   Ht 5\' 4"  (1.626 m)   Wt 216 lb 6.4 oz (98.2 kg)   SpO2 98%   BMI 37.14 kg/m  BP Readings from Last 3 Encounters:  10/27/22 (!) 160/86  03/23/22 (!) 144/70  03/01/22 126/70   Wt Readings from Last 3 Encounters:  10/27/22 216 lb 6.4 oz (98.2 kg)  03/23/22 213 lb 4.8 oz (96.8 kg)  03/01/22 213 lb 3.2 oz (96.7 kg)      Physical Exam Vitals reviewed.  Constitutional:      Appearance: She is well-developed.  HENT:     Head: Normocephalic and atraumatic.     Comments: No temporal artery tenderness. Eyes:     Pupils: Pupils are  equal, round, and reactive to light.  Neck:     Thyroid: No thyromegaly.     Vascular: No JVD.     Comments: Question of subtle bruit right neck.  None noted on the left Cardiovascular:     Rate and Rhythm: Normal rate and regular rhythm.     Heart sounds:     No gallop.  Pulmonary:     Effort: Pulmonary effort is normal. No respiratory distress.     Breath sounds: Normal breath sounds. No wheezing or rales.  Musculoskeletal:     Cervical back: Neck supple.  Neurological:     General: No focal deficit present.     Mental Status: She is alert and oriented to person, place, and time.     Cranial Nerves: No cranial nerve deficit.     Motor: No weakness.     Coordination: Coordination normal.     Gait: Gait normal.  Psychiatric:        Mood and Affect: Mood normal.      No results found for any visits on 10/27/22.  Last CBC Lab Results  Component Value Date   WBC 6.5 05/05/2022   HGB 11.1 (L) 05/05/2022   HCT 34.5 (L) 05/05/2022   MCV 89.4 05/05/2022   MCH 28.8 05/05/2022   RDW 13.6 05/05/2022   PLT 349 05/05/2022   Last metabolic panel Lab Results  Component Value Date   GLUCOSE 99 02/23/2022   NA 132 (L) 02/23/2022   K 3.7 02/23/2022   CL 96 (L) 02/23/2022   CO2 27 02/23/2022   BUN 13 02/23/2022   CREATININE 0.60 06/01/2022   GFRNONAA >60 02/23/2022   CALCIUM 8.4 (L) 02/23/2022   PROT 5.9 (L) 05/05/2022    ALBUMIN 4.2 06/02/2020   LABGLOB 2.4 09/26/2017   AGRATIO 1.7 09/26/2017   BILITOT 0.4 05/05/2022   ALKPHOS 77 06/02/2020   AST 23 05/05/2022   ALT 15 05/05/2022   ANIONGAP 11 02/23/2022   Last lipids Lab Results  Component Value Date   CHOL 169 06/02/2020   HDL 105.10 06/02/2020   LDLCALC 42 06/02/2020   TRIG 112.0 06/02/2020   CHOLHDL 2 06/02/2020   Last hemoglobin A1c No results found for: "HGBA1C"    The ASCVD Risk score (Arnett DK, et al., 2019) failed to calculate for the following reasons:   The valid HDL cholesterol range is 20 to 100 mg/dL    Assessment & Plan:   #1 patient relates 3-4 episodes of very transient blurred vision right eye lasting just seconds.  No other focal symptoms.  Nonfocal exam at this time.  She saw ophthalmologist and had basically normal eye exam.  Does have subtle carotid bruit on the right. ? TIA, recurrent.  Recent sed rate 36.  No temporal artery tenderness.  Doubt temporal arteritis.  -Obtain carotid Dopplers -MRI brain -Start aspirin 81 mg daily -Needs follow-up lipid panel.  Continue Crestor 20 mg daily -Follow-up immediately, call 911, or go to ER for any recurrent visual changes, speech changes, or focal weakness -Set up neurology referral if above unrevealing and symptoms persist  #2 hypertension poorly controlled.  Continue lisinopril 10 mg daily.  Add amlodipine 5 mg daily.  Set up follow-up in 3 weeks to reassess  #3 hyperlipidemia.  Patient on Crestor 20 mg daily.  Needs follow-up lipids.  Nonfasting today.  Get fasting lipids at follow-up in 3 weeks  Evelena Peat, MD

## 2022-10-27 NOTE — Patient Instructions (Addendum)
START baby aspirin 81 mg daily  Start the Amlodipine 5 mg once daily  I will be setting up carotid doppler and MRI of brain.   Follow up immediately for any focal weakness, slurred speech, or recurrent blurred vision.   Set up 3 week follow up

## 2022-10-28 ENCOUNTER — Ambulatory Visit: Payer: Medicare Other

## 2022-10-28 NOTE — Progress Notes (Signed)
Re-made orthotics were fit today and fit was good patient states they feel comfortable  Instructions on wear care and break in were given  If any problems arise patient will call office   [name] Cped CFo, CFm

## 2022-10-29 ENCOUNTER — Other Ambulatory Visit (HOSPITAL_COMMUNITY): Payer: Self-pay

## 2022-11-01 ENCOUNTER — Emergency Department (HOSPITAL_BASED_OUTPATIENT_CLINIC_OR_DEPARTMENT_OTHER): Payer: Medicare Other

## 2022-11-01 ENCOUNTER — Other Ambulatory Visit (HOSPITAL_COMMUNITY): Payer: Self-pay

## 2022-11-01 ENCOUNTER — Other Ambulatory Visit: Payer: Self-pay

## 2022-11-01 ENCOUNTER — Encounter (HOSPITAL_BASED_OUTPATIENT_CLINIC_OR_DEPARTMENT_OTHER): Payer: Self-pay | Admitting: Emergency Medicine

## 2022-11-01 ENCOUNTER — Emergency Department (HOSPITAL_BASED_OUTPATIENT_CLINIC_OR_DEPARTMENT_OTHER)
Admission: EM | Admit: 2022-11-01 | Discharge: 2022-11-01 | Disposition: A | Discharge status: Home / Self Care | Payer: Medicare Other | Attending: Emergency Medicine | Admitting: Emergency Medicine

## 2022-11-01 DIAGNOSIS — Z79899 Other long term (current) drug therapy: Secondary | ICD-10-CM | POA: Diagnosis not present

## 2022-11-01 DIAGNOSIS — R1013 Epigastric pain: Secondary | ICD-10-CM | POA: Diagnosis not present

## 2022-11-01 DIAGNOSIS — R7401 Elevation of levels of liver transaminase levels: Secondary | ICD-10-CM | POA: Diagnosis not present

## 2022-11-01 DIAGNOSIS — R1010 Upper abdominal pain, unspecified: Secondary | ICD-10-CM | POA: Diagnosis not present

## 2022-11-01 DIAGNOSIS — Z90710 Acquired absence of both cervix and uterus: Secondary | ICD-10-CM | POA: Diagnosis not present

## 2022-11-01 DIAGNOSIS — R109 Unspecified abdominal pain: Secondary | ICD-10-CM | POA: Diagnosis present

## 2022-11-01 DIAGNOSIS — R1011 Right upper quadrant pain: Secondary | ICD-10-CM | POA: Diagnosis not present

## 2022-11-01 DIAGNOSIS — I10 Essential (primary) hypertension: Secondary | ICD-10-CM | POA: Insufficient documentation

## 2022-11-01 DIAGNOSIS — Z9049 Acquired absence of other specified parts of digestive tract: Secondary | ICD-10-CM | POA: Diagnosis not present

## 2022-11-01 LAB — COMPREHENSIVE METABOLIC PANEL
ALT: 90 U/L — ABNORMAL HIGH (ref 0–44)
AST: 262 U/L — ABNORMAL HIGH (ref 15–41)
Albumin: 4.2 g/dL (ref 3.5–5.0)
Alkaline Phosphatase: 215 U/L — ABNORMAL HIGH (ref 38–126)
Anion gap: 10 (ref 5–15)
BUN: 18 mg/dL (ref 8–23)
CO2: 26 mmol/L (ref 22–32)
Calcium: 9.3 mg/dL (ref 8.9–10.3)
Chloride: 104 mmol/L (ref 98–111)
Creatinine, Ser: 0.59 mg/dL (ref 0.44–1.00)
GFR, Estimated: >60 mL/min (ref >60)
Glucose, Bld: 93 mg/dL (ref 70–99)
Potassium: 3.9 mmol/L (ref 3.5–5.1)
Sodium: 140 mmol/L (ref 135–145)
Total Bilirubin: 0.7 mg/dL (ref 0.3–1.2)
Total Protein: 6.7 g/dL (ref 6.5–8.1)

## 2022-11-01 LAB — LIPASE, BLOOD: Lipase: 12 U/L (ref 11–51)

## 2022-11-01 LAB — CBC
HCT: 41.8 % (ref 36.0–46.0)
Hemoglobin: 13.2 g/dL (ref 12.0–15.0)
MCH: 29.1 pg (ref 26.0–34.0)
MCHC: 31.6 g/dL (ref 30.0–36.0)
MCV: 92.1 fL (ref 80.0–100.0)
Platelets: 285 10*3/uL (ref 150–400)
RBC: 4.54 MIL/uL (ref 3.87–5.11)
RDW: 14.7 % (ref 11.5–15.5)
WBC: 9.7 10*3/uL (ref 4.0–10.5)
nRBC: 0 % (ref 0.0–0.2)

## 2022-11-01 MED ORDER — IOHEXOL 9 MG/ML PO SOLN
500.0000 mL | ORAL | Status: AC
  Administered 2022-11-01: 500 mL via ORAL

## 2022-11-01 MED ORDER — MORPHINE SULFATE (PF) 4 MG/ML IV SOLN
4.0000 mg | Freq: Once | INTRAVENOUS | Status: AC
  Administered 2022-11-01: 4 mg via INTRAVENOUS
  Filled 2022-11-01: qty 1

## 2022-11-01 MED ORDER — FAMOTIDINE 20 MG PO TABS: 20.0000 mg | ORAL_TABLET | Freq: Two times a day (BID) | ORAL | 1 refills | Status: AC

## 2022-11-01 MED ORDER — SODIUM CHLORIDE 0.9 % IV SOLN: Freq: Once | INTRAVENOUS | Status: AC

## 2022-11-01 MED ORDER — IOHEXOL 300 MG/ML  SOLN
100.0000 mL | Freq: Once | INTRAMUSCULAR | Status: AC | PRN
  Administered 2022-11-01: 100 mL via INTRAVENOUS

## 2022-11-01 MED ORDER — ONDANSETRON HCL 4 MG/2ML IJ SOLN
4.0000 mg | Freq: Once | INTRAMUSCULAR | Status: AC
  Administered 2022-11-01: 4 mg via INTRAVENOUS
  Filled 2022-11-01: qty 2

## 2022-11-01 MED ORDER — FAMOTIDINE 20 MG PO TABS
20.0000 mg | ORAL_TABLET | Freq: Once | ORAL | Status: AC
  Administered 2022-11-01: 20 mg via ORAL
  Filled 2022-11-01: qty 1

## 2022-11-01 MED ORDER — ALUM & MAG HYDROXIDE-SIMETH 200-200-20 MG/5ML PO SUSP
15.0000 mL | Freq: Once | ORAL | Status: AC
  Administered 2022-11-01: 15 mL via ORAL
  Filled 2022-11-01: qty 30

## 2022-11-01 MED ORDER — ONDANSETRON 4 MG PO TBDP: 4.0000 mg | ORAL_TABLET | Freq: Three times a day (TID) | ORAL | 0 refills | Status: AC | PRN

## 2022-11-01 NOTE — ED Triage Notes (Signed)
Pt arrives to ED with c/o COVID x4 days and upper abdominal pain that started last night associated with nausea and radiation to back.

## 2022-11-01 NOTE — ED Notes (Signed)
Pt discharged to home using teachback Method. Discharge instructions have been discussed with patient and/or family members. Pt verbally acknowledges understanding d/c instructions, has been given opportunity for questions to be answered, and endorses comprehension to checkout at registration before leaving.  

## 2022-11-01 NOTE — Discharge Instructions (Addendum)
As discussed, workup today overall reassuring.  Will recommend lifestyle modifications to help decrease likelihood of GERD exacerbations as attached to your discharge papers.  We cannot use a class of medications called PPIs while you are on methotrexate as the medications interact so continue to use Pepcid in the outpatient setting for treatment of GERD.  Recommend close follow-up with primary care in the outpatient setting for reevaluation of your symptoms.  Please do not hesitate to return to emergency department for worrisome signs and symptoms we discussed become apparent.

## 2022-11-01 NOTE — ED Notes (Signed)
Pt returned from US

## 2022-11-01 NOTE — ED Notes (Signed)
Patient transported to CT 

## 2022-11-01 NOTE — ED Provider Notes (Signed)
[place] [facility]ERGENCY DEPARTMENT AT [facility] Provider Note   CSN: [id number] Arrival date & time: 11/01/22  7322     History  Chief Complaint  Patient presents with   Covid Positive   Abdominal Pain    [name] is a 67 y.o. female.   Abdominal Pain   67 year old female presents emergency department with complaints of abdominal pain.  Patient states that abdominal pain began "sometime last night."  States that she was awoken multiple times last night with sharp upper abdominal pain with some radiation beneath her right shoulder blade.  Reports dull pain in between.  States she is felt associated nausea with no vomiting.  She took her at home Zofran without significant improvement of nausea.  Presented to the emergency department due to persistence of symptoms.  States that she has been dealing with symptoms of COVID since 4 days ago.  States that she began with sore throat, nasal congestion, dry cough as well as dull headache.  She took a COVID test at that time and was positive.  She states overall, respiratory symptoms are improving.  Reports chronically dealing with constipation due to being on opioid medication chronically.  Reports fever initially with COVID but none since her abdominal pain began.  Denies any chest pain, shortness of breath.  Past medical history significant for RA on abatacept, methotrexate, hyperlipidemia, hypertension, GERD, obesity, anemia, hiatal hernia  Prior abdominal surgeries include cholecystectomy, gastric bypass  Home Medications Prior to Admission medications   Medication Sig Start Date End Date Taking? Authorizing Provider  famotidine (PEPCID) 20 MG tablet Take 1 tablet (20 mg total) by mouth 2 (two) times daily. 11/01/22  Yes Sherian Maroon A, PA  ondansetron (ZOFRAN-ODT) 4 MG disintegrating tablet Take 1 tablet (4 mg total) by mouth every 8 (eight) hours as needed. 11/01/22  Yes Sherian Maroon A, PA  abatacept (ORENCIA) 250 MG  injection Inject 750 mg into the vein every 30 (thirty) days.    [provider]  amLODipine (NORVASC) 5 MG tablet Take 1 tablet (5 mg total) by mouth daily. 10/27/22   Burchette, Elberta Fortis, MD  B-D TB SYRINGE 1CC/27GX1/2" 27G X 1/2" 1 ML MISC  01/29/20   [provider]  Biotin 10 MG CAPS Take 10 mg by mouth daily.    [provider]  cyclobenzaprine (FLEXERIL) 10 MG tablet Take 1 tablet (10 mg total) by mouth 3 (three) times daily as needed for muscle spasms. 02/18/22   Shuford, French Ana, PA-C  EPINEPHrine (EPIPEN 2-PAK) 0.3 mg/0.3 mL IJ SOAJ injection Inject 0.3 mLs (0.3 mg total) into the muscle once. 10/16/18   Burchette, Elberta Fortis, MD  folic acid (FOLVITE) 800 MCG tablet Take 1,600 mcg by mouth daily.    [provider]  lisinopril (ZESTRIL) 10 MG tablet TAKE ONE TABLET DAILY 09/29/22   Burchette, Elberta Fortis, MD  Methotrexate 25 MG/ML SOSY Inject 25 mg into the skin every 7 (seven) days.    [provider]  nitrofurantoin, macrocrystal-monohydrate, (MACROBID) 100 MG capsule Take 1 capsule (100 mg total) by mouth 2 (two) times daily. 03/23/22   Burchette, Elberta Fortis, MD  oxyCODONE-acetaminophen (PERCOCET) 5-325 MG tablet Take 1 tablet by mouth every 8 (eight) hours as needed for pain 06/16/22     oxyCODONE-acetaminophen (PERCOCET) 5-325 MG tablet Take 1 tablet by mouth every 4 hours as needed for pain 06/16/22     oxyCODONE-acetaminophen (PERCOCET) 5-325 MG tablet Take 1 tablet by mouth every 4 (four) hours  as needed for pain. 09/15/22     oxyCODONE-acetaminophen (PERCOCET) 5-325 MG tablet Take 1 tablet by mouth every 4 hours as needed for pain. 08/18/22     oxyCODONE-acetaminophen (PERCOCET) 5-325 MG tablet Take 1 tablet by mouth every 4 (four) hours as needed. 09/27/22     oxyCODONE-acetaminophen (PERCOCET) 5-325 MG tablet Take 1 tablet by mouth every 4 (four) hours as needed for pain. 10/26/22     polyethylene glycol (MIRALAX / GLYCOLAX) 17 g packet Take 17 g by mouth daily.     [provider]  predniSONE (DELTASONE) 10 MG tablet Take 10 mg by mouth daily with breakfast.    [provider]  rosuvastatin (CRESTOR) 20 MG tablet TAKE 1 TABLET DAILY 02/11/22   Jake Bathe, MD  sertraline (ZOLOFT) 100 MG tablet TAKE ONE TABLET DAILY 09/29/22   Burchette, Elberta Fortis, MD  terbinafine (LAMISIL) 250 MG tablet Take 1 tablet (250 mg total) by mouth daily. 05/06/22   Vivi Barrack, DPM  zolpidem (AMBIEN) 10 MG tablet TAKE ONE TABLET AT BEDTIME AS NEEDED FOR SLEEP 09/16/22   Burchette, Elberta Fortis, MD      Allergies    Bee venom, Keflex [cephalexin], Codeine sulfate, Penicillamine, and Penicillins    Review of Systems   Review of Systems  Gastrointestinal:  Positive for abdominal pain.  All other systems reviewed and are negative.   Physical Exam Updated Vital Signs BP 124/76 (BP Location: Right Arm)   Pulse 64   Temp 98.2 F (36.8 C) (Oral)   Resp 17   SpO2 99%  Physical Exam Vitals and nursing note reviewed.  Constitutional:      General: She is not in acute distress.    Appearance: She is well-developed.  HENT:     Head: Normocephalic and atraumatic.  Eyes:     Conjunctiva/sclera: Conjunctivae normal.  Cardiovascular:     Rate and Rhythm: Normal rate and regular rhythm.     Heart sounds: No murmur heard. Pulmonary:     Effort: Pulmonary effort is normal. No respiratory distress.     Breath sounds: Normal breath sounds.  Abdominal:     Palpations: Abdomen is soft.     Tenderness: There is abdominal tenderness in the right upper quadrant and epigastric area. There is no right CVA tenderness, left CVA tenderness, guarding or rebound.  Musculoskeletal:        General: No swelling.     Cervical back: Neck supple.  Skin:    General: Skin is warm and dry.     Capillary Refill: Capillary refill takes less than 2 seconds.  Neurological:     Mental Status: She is alert.  Psychiatric:        Mood and Affect: Mood normal.     ED Results /  Procedures / Treatments   Labs (all labs ordered are listed, but only abnormal results are displayed) Labs Reviewed  COMPREHENSIVE METABOLIC PANEL - Abnormal; Notable for the following components:      Result Value   AST 262 (*)    ALT 90 (*)    Alkaline Phosphatase 215 (*)    All other components within normal limits  LIPASE, BLOOD  CBC  URINALYSIS, ROUTINE W REFLEX MICROSCOPIC    EKG None  Radiology US Abdomen Limited RUQ (LIVER/GB)  Result Date: 11/01/2022 CLINICAL DATA:  Right upper quadrant pain for 4 days. EXAM: ULTRASOUND ABDOMEN LIMITED RIGHT UPPER QUADRANT COMPARISON:  CT earlier 11/01/2022 FINDINGS: Gallbladder: Surgically absent Common bile duct:  Diameter: 8 mm. Within normal range for the post cholecystectomy state and age. Mild ectasia of the central intrahepatic biliary ducts. Liver: No focal lesion identified. Within normal limits in parenchymal echogenicity. Portal vein is patent on color Doppler imaging with normal direction of blood flow towards the liver. Other: None. IMPRESSION: Previous cholecystectomy. Slight ectasia of the intrahepatic biliary ducts. Electronically Signed   By: Karen Kays M.D.   On: 11/01/2022 13:03   CT ABDOMEN PELVIS W CONTRAST  Result Date: 11/01/2022 CLINICAL DATA:  Upper abdominal pain beginning last night which radiates to the back with associated nausea. History of gastric sleeve. EXAM: CT ABDOMEN AND PELVIS WITH CONTRAST TECHNIQUE: Multidetector CT imaging of the abdomen and pelvis was performed using the standard protocol following bolus administration of intravenous contrast. RADIATION DOSE REDUCTION: This exam was performed according to the departmental dose-optimization program which includes automated exposure control, adjustment of the mA and/or kV according to patient size and/or use of iterative reconstruction technique. CONTRAST:  OMNIPAQUE IOHEXOL 300 MG/ML  SOLN COMPARISON:  MRI abdomen 02/19/2018. CT abdomen and pelvis  01/09/2018. FINDINGS: Lower chest: 3 mm subpleural nodule in the right middle lobe, unchanged from 2019 and considered benign with no follow-up imaging recommended. Partially visualized peripheral density in the lingula likely reflecting scarring. No pleural effusion. Contrast in the distal esophagus which could reflect reflux or dysmotility. Hepatobiliary: No focal liver lesion. Slight intrahepatic and extrahepatic biliary prominence, likely physiologic following cholecystectomy. Pancreas: Fatty infiltration most notable in the pancreatic head and uncinate process. No ductal dilatation or acute inflammation. Spleen: Unremarkable. Adrenals/Urinary Tract: Unremarkable adrenal glands. No suspicious renal mass, calculi, or hydronephrosis. Unremarkable bladder. Stomach/Bowel: Sequelae of sleeve gastrectomy are again identified. There is no evidence of bowel obstruction or inflammation. The appendix is not clearly identified. Vascular/Lymphatic: Normal caliber of the abdominal aorta. No enlarged lymph nodes. Reproductive: Status post hysterectomy. No adnexal masses. Other: No ascites or pneumoperitoneum. Musculoskeletal: No acute osseous abnormality or suspicious osseous lesion. IMPRESSION: No acute abnormality identified in the abdomen or pelvis. Electronically Signed   By: Sebastian Ache M.D.   On: 11/01/2022 11:34    Procedures Procedures    Medications Ordered in ED Medications  famotidine (PEPCID) tablet 20 mg (has no administration in time range)  alum & mag hydroxide-simeth (MAALOX/MYLANTA) 200-200-20 MG/5ML suspension 15 mL (has no administration in time range)  morphine (PF) 4 MG/ML injection 4 mg (4 mg Intravenous Given 11/01/22 1002)  ondansetron (ZOFRAN) injection 4 mg (4 mg Intravenous Given 11/01/22 1002)  0.9 %  sodium chloride infusion ( Intravenous New Bag/Given 11/01/22 1001)  iohexol (OMNIPAQUE) 9 MG/ML oral solution 500 mL (500 mLs Oral Contrast Given 11/01/22 0936)  iohexol (OMNIPAQUE) 300 MG/ML  solution 100 mL (100 mLs Intravenous Contrast Given 11/01/22 1049)    ED Course/ Medical Decision Making/ A&P                                 Medical Decision Making Amount and/or Complexity of Data Reviewed Labs: ordered. Radiology: ordered.  Risk OTC drugs. Prescription drug management.   This patient presents to the ED for concern of abdominal pain, this involves an extensive number of treatment options, and is a complaint that carries with it a high risk of complications and morbidity.  The differential diagnosis includes pancreatitis, CBD pathology, gastritis/GERD, SBO/LBO, volvulus, diverticulitis, appendicitis, pyelonephritis, nephrolithiasis, cystitis   Co morbidities that complicate the patient evaluation  See HPI   Additional history obtained:  Additional history obtained from EMR External records from outside source obtained and reviewed including hospital records   Lab Tests:  I Ordered, and personally interpreted labs.  The pertinent results include: No leukocytosis.  No evidence of anemia.  Platelets within normal range.  No electrolyte abnormalities.  Transaminitis with an AST of 262, ALT of 90 with almost 3:1 ratio.  Alkaline phosphatase elevated of 215.  Total bilirubin within normal range.  No renal dysfunction.  On exam, patient with some reproducible tenderness in both the epigastric and right upper quadrant of her abdomen.  No radiation of pain.  Patient without pulse deficits, significant hypertension   Imaging Studies ordered:  I ordered imaging studies including CT abdomen pelvis, right upper quadrant ultrasound I independently visualized and interpreted imaging which showed  CT abdomen pelvis: No acute abnormality Right upper quadrant ultrasound: No acute abnormality.  Slight ectasia of intra hepatic biliary ducts I agree with the radiologist interpretation   Cardiac Monitoring: / EKG:  The patient was maintained on a cardiac monitor.  I  personally viewed and interpreted the cardiac monitored which showed an underlying rhythm of: Sinus rhythm with PACs   Consultations Obtained:  N/a   Problem List / ED Course / Critical interventions / Medication management  Epigastric abdominal pain I ordered medication including normal saline, morphine, Zofran, Pepcid, Maalox Reevaluation of the patient after these medicines showed that the patient improved I have reviewed the patients home medicines and have made adjustments as needed   Social Determinants of Health:  Former cigarette use.  Denies illicit drug use.   Test / Admission - Considered:  Epigastric abdominal pain Vitals signs within normal range and stable throughout visit. Laboratory/imaging studies significant for: See above 67 year old female presents emergency department with complaints of epigastric abdominal pain, nausea that began sometime last night when she was asleep.  She had some associated nausea.  On exam, patient with tenderness appreciable in both epigastric and right upper quadrant region with invoking feelings of nausea when pressing on patient's abdomen.  Patient's workup today overall reassuring with negative CT imaging as well as ultrasound imaging of abdomen for any acute process.  Patient was with some transaminitis with an AST of 262 as well as ALT of 90 and alk phos of 215 with normal total bilirubin.  Patient states she does not drink alcohol at all.  Will recommend follow-up with primary care in the outpatient setting for reevaluation of liver function tests.  Otherwise, laboratory studies reassuring without any acute abnormality appreciated.  Patient did note improvement of symptoms with administration of GI cocktail and has been diagnosed with GERD in the past and takes Pepcid as needed in the outpatient setting.  Will escalate patient's therapy to involve Protonix as well as lifestyle modifications and follow-up with primary care in the outpatient  setting.  Low suspicion for aortic dissection given lack of hypertension, pain radiating to back, pulse deficits.  Patient with normal lipase and without inflammatory changes on CT study concerning for pancreatitis.  Treatment plan discussed in length with patient and she acknowledged understanding was agreeable to said plan.  Patient overall well-appearing, afebrile in no acute distress, tolerating p.o. without difficulty. Worrisome signs and symptoms were discussed with the patient, and the patient acknowledged understanding to return to the ED if noticed. Patient was stable upon discharge.          Final Clinical Impression(s) / ED Diagnoses Final diagnoses:  Epigastric  abdominal pain  Transaminitis    Rx / DC Orders ED Discharge Orders          Ordered    ondansetron (ZOFRAN-ODT) 4 MG disintegrating tablet  Every 8 hours PRN        11/01/22 1333    famotidine (PEPCID) 20 MG tablet  2 times daily        11/01/22 1333              Peter Garter, [place] 11/01/22 1334    Rondel Baton, MD 11/02/22 1045

## 2022-11-03 ENCOUNTER — Ambulatory Visit (HOSPITAL_BASED_OUTPATIENT_CLINIC_OR_DEPARTMENT_OTHER)
Admission: RE | Admit: 2022-11-03 | Discharge: 2022-11-03 | Disposition: A | Discharge status: Home / Self Care | Payer: Medicare Other | Source: Ambulatory Visit | Attending: Family Medicine | Admitting: Family Medicine

## 2022-11-03 DIAGNOSIS — H538 Other visual disturbances: Secondary | ICD-10-CM | POA: Diagnosis not present

## 2022-11-03 DIAGNOSIS — H539 Unspecified visual disturbance: Secondary | ICD-10-CM | POA: Insufficient documentation

## 2022-11-08 ENCOUNTER — Other Ambulatory Visit: Payer: Self-pay | Admitting: Family Medicine

## 2022-11-09 DIAGNOSIS — M0589 Other rheumatoid arthritis with rheumatoid factor of multiple sites: Secondary | ICD-10-CM | POA: Diagnosis not present

## 2022-11-24 DIAGNOSIS — G5603 Carpal tunnel syndrome, bilateral upper limbs: Secondary | ICD-10-CM | POA: Diagnosis not present

## 2022-11-24 DIAGNOSIS — K5903 Drug induced constipation: Secondary | ICD-10-CM | POA: Diagnosis not present

## 2022-11-24 DIAGNOSIS — M47812 Spondylosis without myelopathy or radiculopathy, cervical region: Secondary | ICD-10-CM | POA: Diagnosis not present

## 2022-11-24 DIAGNOSIS — G894 Chronic pain syndrome: Secondary | ICD-10-CM | POA: Diagnosis not present

## 2022-11-25 ENCOUNTER — Other Ambulatory Visit (HOSPITAL_COMMUNITY): Payer: Self-pay

## 2022-11-25 MED ORDER — OXYCODONE HCL 5 MG PO TABS
5.0000 mg | ORAL_TABLET | ORAL | 0 refills | Status: DC | PRN
  Filled 2022-11-25: qty 180, 30d supply, fill #0

## 2022-11-25 MED ORDER — OXYCODONE HCL 5 MG PO TABS
5.0000 mg | ORAL_TABLET | ORAL | 0 refills | Status: DC | PRN
  Filled 2022-12-30: qty 180, 30d supply, fill #0

## 2022-11-26 ENCOUNTER — Ambulatory Visit: Payer: Medicare Other | Admitting: Family Medicine

## 2022-11-27 ENCOUNTER — Other Ambulatory Visit: Payer: Medicare Other

## 2022-11-29 ENCOUNTER — Other Ambulatory Visit (HOSPITAL_COMMUNITY): Payer: Self-pay

## 2022-11-30 ENCOUNTER — Ambulatory Visit (INDEPENDENT_AMBULATORY_CARE_PROVIDER_SITE_OTHER): Payer: Medicare Other | Admitting: Family Medicine

## 2022-11-30 ENCOUNTER — Telehealth: Payer: Self-pay

## 2022-11-30 VITALS — BP 132/62 | HR 59 | Temp 98.1°F | Ht 64.0 in | Wt 214.8 lb

## 2022-11-30 DIAGNOSIS — R7401 Elevation of levels of liver transaminase levels: Secondary | ICD-10-CM

## 2022-11-30 DIAGNOSIS — I10 Essential (primary) hypertension: Secondary | ICD-10-CM | POA: Diagnosis not present

## 2022-11-30 DIAGNOSIS — E7800 Pure hypercholesterolemia, unspecified: Secondary | ICD-10-CM | POA: Diagnosis not present

## 2022-11-30 DIAGNOSIS — H538 Other visual disturbances: Secondary | ICD-10-CM | POA: Diagnosis not present

## 2022-11-30 DIAGNOSIS — Z23 Encounter for immunization: Secondary | ICD-10-CM

## 2022-11-30 NOTE — Telephone Encounter (Signed)
Transition Care Management Unsuccessful Follow-up Telephone Call  Date of discharge and from where:  [facility] 9/9  Attempts:  2nd  Reason for unsuccessful TCM follow-up call:  No answer/busy   Lenard Forth [place]  [facility], [facility] Guide, Phone: [phone] Website: [email or web address]

## 2022-11-30 NOTE — Telephone Encounter (Signed)
Transition Care Management Unsuccessful Follow-up Telephone Call  Date of discharge and from where:  [facility] 9/9  Attempts:  1st Attempt  Reason for unsuccessful TCM follow-up call:  No answer/busy   Lenard Forth [place]  [facility], [facility] Guide, Phone: [phone] Website: [email or web address]

## 2022-11-30 NOTE — Progress Notes (Signed)
Established Patient Office Visit  Subjective   Patient ID: [name], female    DOB: [birth year]  Age: 67 y.o. MRN: [id number]  Chief Complaint  Patient presents with   Follow-up    Follow up on vision problem. Pt reports R eye is better but still some blur vision.     HPI   [name] is seen for follow-up last visit.  She had presented to her ophthalmologist with transient right sided blurred vision lasting only seconds.  Ophthalmologist had recommended carotid Dopplers and MRI.  Carotid Doppler showed no significant ICA stenosis.  MRI still pending.  She had a family situation that came up where she had to delay getting her MRI.  This is scheduled for a couple weeks from now.  She still has occasional transient issues with right blurred vision.  No slurred speech.  No focal weakness.  No headaches.  Recent sed rate 36.  Blood pressure was significantly elevated last visit.  We added amlodipine to her lisinopril and blood pressure is much improved at this time.  She feels better overall.  She has hyperlipidemia and is on rosuvastatin 20 mg daily.  Needs follow-up lipid panel.  No myalgias.  He had recent COVID infection about a month ago and developed some epigastric pain.  Went to the ER had elevated liver transaminases with elevated alkaline phosphatase, AST, and ALT.  She has had previous cholecystectomy.  No history of elevated liver enzymes.  Abdominal pain resolved at this time.  Ultrasound and CT abdomen pelvis showed no acute findings.  No concerning liver masses.  Her appetite is back to normal at this time.  No alcohol use whatsoever.  Past Medical History:  Diagnosis Date   ANGIOMA 10/03/2008   REMOVED FROM RIGHT LOWER LEG-BENIGN   Anxiety    Aortic atherosclerosis (HCC)    Arthritis    RA   Bronchitis    hx of    Cancer (HCC)    BASAL CELL SKIN CANCER   Complication of anesthesia    Coronary artery disease    COVID-19 06/18/2020   HA, chills, myalgias, congestion  & fever, all symptoms resolved as of 12/29/20 per patient   Depression    Fibromyalgia    GERD 08/27/2008   History of blood transfusion    1995   History of hiatal hernia    hx of has had surgically repaired   History of measles    History of mumps    History of nonmelanoma skin cancer    HYPERLIPIDEMIA 08/27/2008   HYPERTENSION 08/27/2008   pt is currently on no meds    Pneumonia    hx of    PONV (postoperative nausea and vomiting)    Rheumatoid arthritis (HCC)    SEBORRHEIC KERATOSIS 08/27/2008   Sinus headache    Vocal cord paresis    hx of    Wears glasses    Wears partial dentures    Past Surgical History:  Procedure Laterality Date   ABDOMINAL HYSTERECTOMY  1995   with appendectomy   BREAST BIOPSY Left 2013   BREAST DUCTAL SYSTEM EXCISION  08/16/2011   Procedure: EXCISION DUCTAL SYSTEM BREAST;  Surgeon: Maisie Fus A. Cornett, MD;  Location: [facility];  Service: General;  Laterality: Left;  left breast duct excision   CARPAL TUNNEL RELEASE Right 01/01/2021   Procedure: CARPAL TUNNEL RELEASE;  Surgeon: Gomez Cleverly, MD;  Location: [facility];  Service: Orthopedics;  Laterality: Right;  CESAREAN SECTION  1980   times 2; 1980 and 1982   CESAREAN SECTION  1982   CHOLECYSTECTOMY  1997   COLONOSCOPY  09/15/2018   EXCISION/RELEASE BURSA HIP Right 10/21/2014   Procedure: OPEN RIGHT HIP BURSECTOMY, EXOSECTOMY;  Surgeon: Durene Romans, MD;  Location: [facility];  Service: Orthopedics;  Laterality: Right;   foot surgery  2004, 2005, 2008   bilat secondary to neuropathy    JOINT REPLACEMENT  11/2010   lt total knee X2   KNEE ARTHROSCOPY  2007, 2008, 2009   LAPAROSCOPIC GASTRIC SLEEVE RESECTION N/A 10/30/2013   Procedure: LAPAROSCOPIC GASTRIC SLEEVE RESECTION WITH HIATAL HERNIA REPAIR AND UPPER ENDOSCOPY;  Surgeon: Gaynelle Adu, MD;  Location: [facility];  Service: General;  Laterality: N/A;   NASAL SINUS SURGERY  2013   OPEN SURGICAL REPAIR OF GLUTEAL  TENDON  01/17/2012   Procedure: OPEN SURGICAL REPAIR OF GLUTEAL TENDON;  Surgeon: Shelda Pal, MD;  Location: [facility];  Service: Orthopedics;  Laterality: Right;   REVERSE SHOULDER ARTHROPLASTY Left 02/18/2022   Procedure: REVERSE SHOULDER ARTHROPLASTY;  Surgeon: Francena Hanly, MD;  Location: [facility];  Service: Orthopedics;  Laterality: Left;   ROTATOR CUFF REPAIR Bilateral 2020   2021   TOTAL KNEE ARTHROPLASTY Right 04/15/2020   Procedure: TOTAL KNEE ARTHROPLASTY;  Surgeon: Durene Romans, MD;  Location: [facility];  Service: Orthopedics;  Laterality: Right;  70 mins   ULNAR TUNNEL RELEASE Right 01/01/2021   Procedure: CUBITAL TUNNEL RELEASE;  Surgeon: Gomez Cleverly, MD;  Location: [facility];  Service: Orthopedics;  Laterality: Right;  with MAC anesthesia needs 45 minutes    reports that she quit smoking about 38 years ago. Her smoking use included cigarettes. She started smoking about 42 years ago. She has a 1 pack-year smoking history. She has never used smokeless tobacco. She reports that she does not drink alcohol and does not use drugs. family history includes Breast cancer (age of onset: 37) in her mother; Cancer in her mother; Diabetes in her son; Epilepsy in her father; Heart attack in her sister and sister; Kidney disease in her father. Allergies  Allergen Reactions   Bee Venom Anaphylaxis   Keflex [Cephalexin] Hives    Pt took po kelfex for sinus infection, developed blisters over entire body   Codeine Sulfate Other (See Comments)    GI upset   Penicillamine Other (See Comments)   Penicillins Rash    Review of Systems  Constitutional:  Negative for chills and fever.  Eyes:  Positive for blurred vision. Negative for double vision, photophobia, pain and redness.  Cardiovascular:  Negative for chest pain.  Neurological:  Negative for dizziness and headaches.      Objective:     BP 132/62 (BP Location: Right Arm, Cuff Size: Large)   Pulse (!) 59   Temp 98.1 F  (36.7 C) (Oral)   Ht 5\' 4"  (1.626 m)   Wt 214 lb 12.8 oz (97.4 kg)   SpO2 98%   BMI 36.87 kg/m  BP Readings from Last 3 Encounters:  11/30/22 132/62  11/01/22 124/76  10/29/22 (!) 160/86   Wt Readings from Last 3 Encounters:  11/30/22 214 lb 12.8 oz (97.4 kg)  10/27/22 216 lb 6.4 oz (98.2 kg)  03/23/22 213 lb 4.8 oz (96.8 kg)      Physical Exam Vitals reviewed.  Constitutional:      General: She is not in acute distress.    Appearance: Normal appearance. She is not ill-appearing.  Cardiovascular:     Rate and Rhythm: Normal rate and regular rhythm.  Pulmonary:     Effort: Pulmonary effort is normal.     Breath sounds: Normal breath sounds.  Abdominal:     Palpations: Abdomen is soft. There is no mass.     Tenderness: There is no abdominal tenderness. There is no guarding.  Musculoskeletal:     Cervical back: Neck supple.     Right lower leg: No edema.     Left lower leg: No edema.  Lymphadenopathy:     Cervical: No cervical adenopathy.  Neurological:     Mental Status: She is alert.      No results found for any visits on 11/30/22.  Last CBC Lab Results  Component Value Date   WBC 9.7 11/01/2022   HGB 13.2 11/01/2022   HCT 41.8 11/01/2022   MCV 92.1 11/01/2022   MCH 29.1 11/01/2022   RDW 14.7 11/01/2022   PLT 285 11/01/2022   Last metabolic panel Lab Results  Component Value Date   GLUCOSE 93 11/01/2022   NA 140 11/01/2022   K 3.9 11/01/2022   CL 104 11/01/2022   CO2 26 11/01/2022   BUN 18 11/01/2022   CREATININE 0.59 11/01/2022   GFRNONAA >60 11/01/2022   CALCIUM 9.3 11/01/2022   PROT 6.7 11/01/2022   ALBUMIN 4.2 11/01/2022   LABGLOB 2.4 09/26/2017   AGRATIO 1.7 09/26/2017   BILITOT 0.7 11/01/2022   ALKPHOS 215 (H) 11/01/2022   AST 262 (H) 11/01/2022   ALT 90 (H) 11/01/2022   ANIONGAP 10 11/01/2022   Last lipids Lab Results  Component Value Date   CHOL 169 06/02/2020   HDL 105.10 06/02/2020   LDLCALC 42 06/02/2020   TRIG 112.0  06/02/2020   CHOLHDL 2 06/02/2020   Last hemoglobin A1c No results found for: "HGBA1C" Last thyroid functions Lab Results  Component Value Date   TSH 2.30 12/05/2019   T4TOTAL 8.4 07/11/2013      The ASCVD Risk score (Arnett DK, et al., 2019) failed to calculate for the following reasons:   The valid HDL cholesterol range is 20 to 100 mg/dL    Assessment & Plan:   #1 hypertension improved with recent addition of amlodipine to her lisinopril.  Continue current regimen.  Continue close monitoring.  Continue low-sodium diet  #2 hyperlipidemia treated with rosuvastatin 20 mg daily.  Future lab order placed for CMP and lipid  #3 recent elevated liver transaminases in the setting of acute COVID infection.  Ultrasound and CAT scan no acute findings.  Abdominal symptoms have resolved.  She does not use any alcohol.  Future lab order as above for repeat CMP  #4 recent transient blurred vision right eye lasting only seconds.  MRI brain pending.  Doppler showed no significant ICA lesions.  If MRI unrevealing and symptoms persist recommend follow-up with neurologist.   Return in about 6 months (around 05/31/2023).    Evelena Peat, MD

## 2022-11-30 NOTE — Patient Instructions (Signed)
Follow through with the MRI  Set up labs, as discussed  BP much improved!   Continue to monitor.

## 2022-12-03 ENCOUNTER — Other Ambulatory Visit: Payer: Self-pay | Admitting: Family Medicine

## 2022-12-07 ENCOUNTER — Other Ambulatory Visit: Payer: Medicare Other

## 2022-12-07 DIAGNOSIS — Z79899 Other long term (current) drug therapy: Secondary | ICD-10-CM | POA: Diagnosis not present

## 2022-12-07 DIAGNOSIS — M0589 Other rheumatoid arthritis with rheumatoid factor of multiple sites: Secondary | ICD-10-CM | POA: Diagnosis not present

## 2022-12-13 ENCOUNTER — Other Ambulatory Visit: Payer: Self-pay | Admitting: Internal Medicine

## 2022-12-14 MED ORDER — ZOLPIDEM TARTRATE 10 MG PO TABS: 10.0000 mg | ORAL_TABLET | Freq: Every evening | ORAL | 0 refills | Status: DC | PRN

## 2022-12-16 ENCOUNTER — Other Ambulatory Visit (INDEPENDENT_AMBULATORY_CARE_PROVIDER_SITE_OTHER): Payer: Medicare Other

## 2022-12-16 DIAGNOSIS — R7401 Elevation of levels of liver transaminase levels: Secondary | ICD-10-CM | POA: Diagnosis not present

## 2022-12-16 DIAGNOSIS — E7800 Pure hypercholesterolemia, unspecified: Secondary | ICD-10-CM

## 2022-12-16 LAB — LIPID PANEL
Cholesterol: 154 mg/dL (ref 0–200)
HDL: 77.7 mg/dL (ref >39.00)
LDL Cholesterol: 57 mg/dL (ref 0–99)
NonHDL: 76.19
Total CHOL/HDL Ratio: 2
Triglycerides: 98 mg/dL (ref 0.0–149.0)
VLDL: 19.6 mg/dL (ref 0.0–40.0)

## 2022-12-16 LAB — COMPREHENSIVE METABOLIC PANEL
ALT: 15 U/L (ref 0–35)
AST: 23 U/L (ref 0–37)
Albumin: 3.7 g/dL (ref 3.5–5.2)
Alkaline Phosphatase: 76 U/L (ref 39–117)
BUN: 14 mg/dL (ref 6–23)
CO2: 31 meq/L (ref 19–32)
Calcium: 9.2 mg/dL (ref 8.4–10.5)
Chloride: 105 meq/L (ref 96–112)
Creatinine, Ser: 0.59 mg/dL (ref 0.40–1.20)
GFR: 93.64 mL/min (ref >60.00)
Glucose, Bld: 82 mg/dL (ref 70–99)
Potassium: 4.1 meq/L (ref 3.5–5.1)
Sodium: 142 meq/L (ref 135–145)
Total Bilirubin: 0.5 mg/dL (ref 0.2–1.2)
Total Protein: 6 g/dL (ref 6.0–8.3)

## 2022-12-18 ENCOUNTER — Ambulatory Visit
Admission: RE | Admit: 2022-12-18 | Discharge: 2022-12-18 | Disposition: A | Discharge status: Home / Self Care | Payer: Medicare Other | Source: Ambulatory Visit | Attending: Family Medicine | Admitting: Family Medicine

## 2022-12-18 DIAGNOSIS — H539 Unspecified visual disturbance: Secondary | ICD-10-CM

## 2022-12-18 DIAGNOSIS — I6389 Other cerebral infarction: Secondary | ICD-10-CM | POA: Diagnosis not present

## 2022-12-22 DIAGNOSIS — M79641 Pain in right hand: Secondary | ICD-10-CM | POA: Diagnosis not present

## 2022-12-30 ENCOUNTER — Other Ambulatory Visit (HOSPITAL_COMMUNITY): Payer: Self-pay

## 2023-01-04 DIAGNOSIS — M0589 Other rheumatoid arthritis with rheumatoid factor of multiple sites: Secondary | ICD-10-CM | POA: Diagnosis not present

## 2023-01-11 ENCOUNTER — Other Ambulatory Visit: Payer: Self-pay | Admitting: Cardiology

## 2023-01-11 DIAGNOSIS — M79644 Pain in right finger(s): Secondary | ICD-10-CM | POA: Diagnosis not present

## 2023-01-24 ENCOUNTER — Other Ambulatory Visit (HOSPITAL_COMMUNITY): Payer: Self-pay

## 2023-01-24 DIAGNOSIS — G5603 Carpal tunnel syndrome, bilateral upper limbs: Secondary | ICD-10-CM | POA: Diagnosis not present

## 2023-01-24 DIAGNOSIS — M47812 Spondylosis without myelopathy or radiculopathy, cervical region: Secondary | ICD-10-CM | POA: Diagnosis not present

## 2023-01-24 DIAGNOSIS — K5903 Drug induced constipation: Secondary | ICD-10-CM | POA: Diagnosis not present

## 2023-01-24 DIAGNOSIS — G894 Chronic pain syndrome: Secondary | ICD-10-CM | POA: Diagnosis not present

## 2023-01-24 MED ORDER — OXYCODONE HCL 5 MG PO TABS
ORAL_TABLET | ORAL | 0 refills | Status: DC
  Filled 2023-03-09: qty 180, 30d supply, fill #0

## 2023-01-24 MED ORDER — OXYCODONE HCL 5 MG PO TABS
ORAL_TABLET | ORAL | 0 refills | Status: DC
  Filled 2023-02-02: qty 180, 30d supply, fill #0

## 2023-01-25 ENCOUNTER — Other Ambulatory Visit (HOSPITAL_COMMUNITY): Payer: Self-pay

## 2023-01-27 DIAGNOSIS — M79641 Pain in right hand: Secondary | ICD-10-CM | POA: Diagnosis not present

## 2023-02-01 DIAGNOSIS — Z111 Encounter for screening for respiratory tuberculosis: Secondary | ICD-10-CM | POA: Diagnosis not present

## 2023-02-01 DIAGNOSIS — R5383 Other fatigue: Secondary | ICD-10-CM | POA: Diagnosis not present

## 2023-02-01 DIAGNOSIS — Z79899 Other long term (current) drug therapy: Secondary | ICD-10-CM | POA: Diagnosis not present

## 2023-02-01 DIAGNOSIS — M0589 Other rheumatoid arthritis with rheumatoid factor of multiple sites: Secondary | ICD-10-CM | POA: Diagnosis not present

## 2023-02-02 ENCOUNTER — Other Ambulatory Visit (HOSPITAL_COMMUNITY): Payer: Self-pay

## 2023-02-05 ENCOUNTER — Other Ambulatory Visit (HOSPITAL_COMMUNITY): Payer: Self-pay

## 2023-02-14 ENCOUNTER — Other Ambulatory Visit: Payer: Self-pay | Admitting: Cardiology

## 2023-02-20 DIAGNOSIS — M79641 Pain in right hand: Secondary | ICD-10-CM | POA: Diagnosis not present

## 2023-02-21 ENCOUNTER — Ambulatory Visit (INDEPENDENT_AMBULATORY_CARE_PROVIDER_SITE_OTHER): Payer: Medicare Other

## 2023-02-21 ENCOUNTER — Ambulatory Visit (INDEPENDENT_AMBULATORY_CARE_PROVIDER_SITE_OTHER): Payer: Medicare Other | Admitting: Podiatry

## 2023-02-21 ENCOUNTER — Encounter: Payer: Self-pay | Admitting: Podiatry

## 2023-02-21 DIAGNOSIS — R52 Pain, unspecified: Secondary | ICD-10-CM | POA: Diagnosis not present

## 2023-02-21 DIAGNOSIS — M7671 Peroneal tendinitis, right leg: Secondary | ICD-10-CM

## 2023-02-21 DIAGNOSIS — M778 Other enthesopathies, not elsewhere classified: Secondary | ICD-10-CM

## 2023-02-21 DIAGNOSIS — M7751 Other enthesopathy of right foot: Secondary | ICD-10-CM

## 2023-02-21 MED ORDER — TAVABOROLE 5 % EX SOLN: 1.0000 [drp] | Freq: Every day | CUTANEOUS | 0 refills | Status: AC

## 2023-02-21 NOTE — Progress Notes (Signed)
Subjective: Chief Complaint  Patient presents with   Foot Pain    RM#11 Right foot/ankle pain no injuries started  months ago.Patient states hurts all the time worse at night.   67 year old female presents with her husband for new complaints of pain to the right lateral ankle.  This started a few months ago without any injury.  She does not recall any changes in activity when it started.  She denies any recent treatment.  She does get some swelling.  Objective: AAO x3, NAD DP/PT pulses palpable bilaterally, CRT less than 3 seconds There is tenderness palpation of the lateral aspect the right foot over the sinus tarsi and also along the course of the peroneal tendon.  There is localized edema lateral ankle on the sinus tarsi there is no erythema or any warmth.  There is no area pinpoint tenderness.  Flexor, extensor tendons appear to be intact. No pain with calf compression, swelling, warmth, erythema  Assessment: Capsulitis, tendinitis right side  Plan: -All treatment options discussed with the patient including all alternatives, risks, complications.  -X-rays obtained reviewed.  Multiple views were obtained.  No evidence of acute fracture noted today.  Calcaneal spurring present.  Previous fifth metatarsal head resection. -We discussed steroid injection she was to proceed with this.  Skin cleaned Betadine, alcohol and after 1 cc Kenalog 10, 0.5 cc of Marcaine plain, 0.5 cc of lidocaine plain was infiltrated into the sinus tarsi without complications.  Postinjection care discussed.  Tolerated well. -Ankle brace which she already has -Dispensed a new pair of power step inserts.  Her custom inserts seem to hurt her foot more. She has been wearing [place]oka which she likes but discussed inside of her Brooks shoes we can try the powersteps.  -If no improvement consider advanced imaging. -Discussed stretching, range of motion exercises to help with ankle stability. -Patient encouraged to call the  office with any questions, concerns, change in symptoms.   Return in about 2 months (around 04/22/2023).  Vivi Barrack DPM

## 2023-02-21 NOTE — Patient Instructions (Addendum)
While at your visit today you received a steroid injection in your foot or ankle to help with your pain. Along with having the steroid medication there is some "numbing" medication in the shot that you received. Due to this you may notice some numbness to the area for the next couple of hours.   I would recommend limiting activity for the next few days to help the steroid injection take affect.    The actually benefit from the steroid injection may take up to 2-7 days to see a difference. You may actually experience a small (as in 10%) INCREASE in pain in the first 24 hours---that is common. It would be best if you can ice the area today and take anti-inflammatory medications (such as Ibuprofen, Motrin, or Aleve) if you are able to take these medications. If you were prescribed another medication to help with the pain go ahead and start that medication today    Things to watch out for that you should contact us or a health care provider urgently would include: 1. Unusual (as in more than 10%) increase in pain 2. New fever > 101.5 3. New swelling or redness of the injected area.  4. Streaking of red lines around the area injected.  If you have any questions or concerns about this, please give our office a call at [phone].   --  Ankle Sprain, Phase I Rehab An ankle sprain is an injury to the tissues that connect bone to bone (ligaments) in your ankle. Ankle sprains can cause stiffness, loss of motion, and loss of strength. Ask your health care provider which exercises are safe for you. Do exercises exactly as told by your provider and adjust them as directed. It is normal to feel mild stretching, pulling, tightness, or discomfort as you do these exercises. Stop right away if you feel sudden pain or your pain gets worse. Do not begin these exercises until told by your provider. Stretching and range-of-motion exercises These exercises warm up your muscles and joints. They can improve the movement  and flexibility of your lower leg and ankle. They also help to relieve pain and stiffness. Gastroc and soleus stretch This exercise is also called a calf stretch. It stretches the muscles in the back of the lower leg. These muscles are the gastrocnemius, or gastroc, and the soleus. Sit on the floor with your left / right leg extended. Loop a belt or towel around the ball of your left / right foot. The ball of your foot is on the walking surface, right under your toes. Keep your left / right ankle and foot relaxed and keep your knee straight. Use the belt or towel to pull your foot toward you. You should feel a gentle stretch behind your calf or knee in your gastroc muscle. Hold this position for __________ seconds, then release to the starting position. Repeat the exercise with your knee bent. You can put a pillow or a rolled bath towel under your knee to support it. You should feel a stretch deep in your calf in the soleus muscle or at your Achilles tendon. Repeat __________ times. Complete this exercise __________ times a day. Ankle alphabet  Sit with your left / right leg supported at the lower leg. Do not rest your foot on anything. Make sure your foot has room to move freely. Think of your left / right foot as a paintbrush. Move your foot to trace each letter of the alphabet in the air. Keep your hip  and knee still while you trace. Make the letters as large as you can without feeling discomfort. Trace every letter from A to Z. Repeat __________ times. Complete this exercise __________ times a day. Strengthening exercises These exercises build strength and endurance in your ankle and lower leg. Endurance is the ability to use your muscles for a long time, even after they get tired. Ankle dorsiflexion  Secure a rubber exercise band or tube to an object, such as a table leg, that will stay still when the band is pulled. Secure the other end around your left / right foot. Sit on the floor  facing the object, with your left / right leg extended. The band or tube should be slightly tense when your foot is relaxed. Slowly bring your foot toward you, bringing the top of your foot toward your shin (dorsiflexion), and pulling the band tighter. Hold this position for __________ seconds. Slowly return your foot to the starting position. Repeat __________ times. Complete this exercise __________ times a day. Ankle plantar flexion  Sit on the floor with your left / right leg extended. Loop a rubber exercise tube or band around the ball of your left / right foot. The ball of your foot is on the walking surface, right under your toes. Hold the ends of the band or tube in your hands. The band or tube should be slightly tense when your foot is relaxed. Slowly point your foot and toes downward to tilt the top of your foot away from your shin (plantar flexion). Hold this position for __________ seconds. Slowly return your foot to the starting position. Repeat __________ times. Complete this exercise __________ times a day. Ankle eversion  Sit on the floor with your legs straight out in front of you. Loop a rubber exercise band or tube around the ball of your left / right foot. The ball of your foot is on the walking surface, right under your toes. Hold the ends of the band in your hands or secure the band to a stable object. The band or tube should be slightly tense when your foot is relaxed. Slowly push your foot outward, away from your other leg (eversion). Hold this position for __________ seconds. Slowly return your foot to the starting position. Repeat __________ times. Complete this exercise __________ times a day. This information is not intended to replace advice given to you by your health care provider. Make sure you discuss any questions you have with your health care provider. Document Revised: 12/02/2021 Document Reviewed: 12/02/2021 Elsevier Patient Education  2024 [employer].

## 2023-03-03 DIAGNOSIS — Z111 Encounter for screening for respiratory tuberculosis: Secondary | ICD-10-CM | POA: Diagnosis not present

## 2023-03-03 DIAGNOSIS — M0589 Other rheumatoid arthritis with rheumatoid factor of multiple sites: Secondary | ICD-10-CM | POA: Diagnosis not present

## 2023-03-03 DIAGNOSIS — Z79899 Other long term (current) drug therapy: Secondary | ICD-10-CM | POA: Diagnosis not present

## 2023-03-03 DIAGNOSIS — R5383 Other fatigue: Secondary | ICD-10-CM | POA: Diagnosis not present

## 2023-03-09 ENCOUNTER — Other Ambulatory Visit (HOSPITAL_COMMUNITY): Payer: Self-pay

## 2023-03-11 ENCOUNTER — Other Ambulatory Visit: Payer: Self-pay | Admitting: Family Medicine

## 2023-03-21 ENCOUNTER — Telehealth: Payer: Medicare Other | Admitting: Emergency Medicine

## 2023-03-21 DIAGNOSIS — J069 Acute upper respiratory infection, unspecified: Secondary | ICD-10-CM

## 2023-03-21 MED ORDER — FLUTICASONE PROPIONATE 50 MCG/ACT NA SUSP: 2.0000 | Freq: Every day | NASAL | 0 refills | Status: AC

## 2023-03-21 NOTE — Progress Notes (Signed)
E-Visit for Upper Respiratory Infection   We are sorry you are not feeling well.  Here is how we plan to help!  Based on what you have shared with me, it looks like you may have a viral upper respiratory infection.  Upper respiratory infections are caused by a large number of viruses; however, rhinovirus is the most common cause. COVID is also a possiblity and I recommend you test for covid at home. There are high levels of flu in the community but your symptoms do not sound like influenza. If you are worried about flu, there are home flu/covid tests you can use or you can go to an urgent care.   Symptoms vary from person to person, with common symptoms including sore throat, cough, fatigue or lack of energy and feeling of general discomfort.  A low-grade fever of up to 100.4 may present, but is often uncommon.  Symptoms vary however, and are closely related to a person's age or underlying illnesses.  The most common symptoms associated with an upper respiratory infection are nasal discharge or congestion, cough, sneezing, headache and pressure in the ears and face.  These symptoms usually persist for about 3 to 10 days, but can last up to 2 weeks.  It is important to know that upper respiratory infections do not cause serious illness or complications in most cases.    Upper respiratory infections can be transmitted from person to person, with the most common method of transmission being a person's hands.  The virus is able to live on the skin and can infect other persons for up to 2 hours after direct contact.  Also, these can be transmitted when someone coughs or sneezes; thus, it is important to cover the mouth to reduce this risk.  To keep the spread of the illness at bay, good hand hygiene is very important.  This is an infection that is most likely caused by a virus. There are no specific treatments other than to help you with the symptoms until the infection runs its course.  We are sorry you are  not feeling well.  Here is how we plan to help!   For nasal congestion, you may use an oral decongestants such as Mucinex D or if you have glaucoma or high blood pressure use plain Mucinex.  Saline nasal spray or nasal drops can help and can safely be used as often as needed for congestion.  For your congestion, I have prescribed Fluticasone nasal spray one spray in each nostril twice a day  If you do not have a history of heart disease, hypertension, diabetes or thyroid disease, prostate/bladder issues or glaucoma, you may also use Sudafed to treat nasal congestion.  It is highly recommended that you consult with a pharmacist or your primary care physician to ensure this medication is safe for you to take.     If you have a cough, you may use cough suppressants such as Delsym and Robitussin.  If you have glaucoma or high blood pressure, you can also use Coricidin HBP.   For your cough, I recommend Mucinex DM  If you have a sore or scratchy throat, use a saltwater gargle-  to  teaspoon of salt dissolved in a 4-ounce to 8-ounce glass of warm water.  Gargle the solution for approximately 15-30 seconds and then spit.  It is important not to swallow the solution.  You can also use throat lozenges/cough drops and Chloraseptic spray to help with throat pain or discomfort.  Warm or cold liquids can also be helpful in relieving throat pain.  For headache, pain or general discomfort, you can use Ibuprofen or Tylenol as directed.   Some authorities believe that zinc sprays or the use of Echinacea may shorten the course of your symptoms.   HOME CARE Only take medications as instructed by your medical team. Be sure to drink plenty of fluids. Water is fine as well as fruit juices, sodas and electrolyte beverages. You may want to stay away from caffeine or alcohol. If you are nauseated, try taking small sips of liquids. How do you know if you are getting enough fluid? Your urine should be a pale yellow or  almost colorless. Get rest. Taking a steamy shower or using a humidifier may help nasal congestion and ease sore throat pain. You can place a towel over your head and breathe in the steam from hot water coming from a faucet. Using a saline nasal spray works much the same way. Cough drops, hard candies and sore throat lozenges may ease your cough. Avoid close contacts especially the very young and the elderly Cover your mouth if you cough or sneeze Always remember to wash your hands.   GET HELP RIGHT AWAY IF: You develop worsening fever. If your symptoms do not improve within 10 days You develop yellow or green discharge from your nose over 3 days. You have coughing fits You develop a severe head ache or visual changes. You develop shortness of breath, difficulty breathing or start having chest pain Your symptoms persist after you have completed your treatment plan  MAKE SURE YOU  Understand these instructions. Will watch your condition. Will get help right away if you are not doing well or get worse.  Thank you for choosing an e-visit.  Your e-visit answers were reviewed by a board certified advanced clinical practitioner to complete your personal care plan. Depending upon the condition, your plan could have included both over the counter or prescription medications.  Please review your pharmacy choice. Make sure the pharmacy is open so you can pick up prescription now. If there is a problem, you may contact your provider through [employer] and have the prescription routed to another pharmacy.  Your safety is important to [place]. If you have drug allergies check your prescription carefully.   For the next 24 hours you can use MyChart to ask questions about today's visit, request a non-urgent call back, or ask for a work or school excuse. You will get an email in the next two days asking about your experience. I hope that your e-visit has been valuable and will speed your  recovery.  I have spent 5 minutes in review of e-visit questionnaire, review and updating patient chart, medical decision making and response to patient.   Rica Mast, PhD, FNP-BC

## 2023-03-22 ENCOUNTER — Ambulatory Visit (INDEPENDENT_AMBULATORY_CARE_PROVIDER_SITE_OTHER): Payer: Medicare Other | Admitting: Family Medicine

## 2023-03-22 ENCOUNTER — Encounter: Payer: Self-pay | Admitting: Family Medicine

## 2023-03-22 DIAGNOSIS — M79641 Pain in right hand: Secondary | ICD-10-CM | POA: Diagnosis not present

## 2023-03-22 DIAGNOSIS — Z6836 Body mass index (BMI) 36.0-36.9, adult: Secondary | ICD-10-CM | POA: Diagnosis not present

## 2023-03-22 DIAGNOSIS — E669 Obesity, unspecified: Secondary | ICD-10-CM | POA: Diagnosis not present

## 2023-03-22 DIAGNOSIS — R110 Nausea: Secondary | ICD-10-CM | POA: Diagnosis not present

## 2023-03-22 DIAGNOSIS — M797 Fibromyalgia: Secondary | ICD-10-CM | POA: Diagnosis not present

## 2023-03-22 DIAGNOSIS — M0589 Other rheumatoid arthritis with rheumatoid factor of multiple sites: Secondary | ICD-10-CM | POA: Diagnosis not present

## 2023-03-22 DIAGNOSIS — M1991 Primary osteoarthritis, unspecified site: Secondary | ICD-10-CM | POA: Diagnosis not present

## 2023-03-22 DIAGNOSIS — Z1231 Encounter for screening mammogram for malignant neoplasm of breast: Secondary | ICD-10-CM

## 2023-03-22 DIAGNOSIS — Z0000 Encounter for general adult medical examination without abnormal findings: Secondary | ICD-10-CM | POA: Diagnosis not present

## 2023-03-22 DIAGNOSIS — G5603 Carpal tunnel syndrome, bilateral upper limbs: Secondary | ICD-10-CM | POA: Diagnosis not present

## 2023-03-22 DIAGNOSIS — J0191 Acute recurrent sinusitis, unspecified: Secondary | ICD-10-CM

## 2023-03-22 MED ORDER — DOXYCYCLINE HYCLATE 100 MG PO TABS: 100.0000 mg | ORAL_TABLET | Freq: Two times a day (BID) | ORAL | 0 refills | Status: DC

## 2023-03-22 NOTE — Progress Notes (Signed)
PATIENT CHECK-IN and HEALTH RISK ASSESSMENT QUESTIONNAIRE:  -completed by phone/video for upcoming Medicare Preventive Visit   Pre-Visit Check-in: 1)Vitals (height, wt, BP, etc) - record in vitals section for visit on day of visit Request home vitals (wt, BP, etc.) and enter into vitals, THEN update Vital Signs SmartPhrase below at the top of the HPI. See below.  2)Review and Update Medications, Allergies PMH, Surgeries, Social history in Epic 3)Hospitalizations in the last year with date/reason? no  4)Review and Update Care Team (patient's specialists) in Epic 5) Complete PHQ9 in Epic  6) Complete Fall Screening in Epic 7)Review all Health Maintenance Due and order under PCP if not done.  Medicare Wellness Patient Questionnaire:  Answer theses question about your habits: How often do you have a drink containing alcohol?never Have you ever smoked? Quit over 42 years ago On average, how many days per week do you engage in moderate to strenuous exercise (like a brisk walk)? walks On average, how many minutes do you engage in exercise at this level? Does 30 minutes 3 days per week Reports diet is pretty healthy - had gastric sleeve - does lots of veggies, protein, switched to all whole grains - does not   Beverages: water   Answer theses question about your everyday activities: Can you perform most household chores? y Are you deaf or have significant trouble hearing?no Do you feel that you have a problem with memory?no Do you feel safe at home?yes Last dentist visit? Goes every 6 months 8. Do you have any difficulty performing your everyday activities?n Are you having any difficulty walking, taking medications on your own, and or difficulty managing daily home needs?n Do you have difficulty walking or climbing stairs?n Do you have difficulty dressing or bathing?n Do you have difficulty doing errands alone such as visiting a doctor's office or shopping?n Do you currently have any  difficulty preparing food and eating?n Do you currently have any difficulty using the toilet?n Do you have any difficulty managing your finances?n Do you have any difficulties with housekeeping of managing your housekeeping?n   Do you have Advanced Directives in place (Living Will, Healthcare Power or Attorney)? no   Last eye Exam and location? Goes every year   Do you currently use prescribed or non-prescribed narcotic or opioid pain medications? Yes prescribed by pain management - she only takes one pill per day as tries to limit due to risks.      ----------------------------------------------------------------------------------------------------------------------------------------------------------------------------------------------------------------------  Because this visit was a virtual/telehealth visit, some criteria may be missing or patient reported. Any vitals not documented were not able to be obtained and vitals that have been documented are patient reported.    MEDICARE ANNUAL PREVENTIVE VISIT WITH PROVIDER: (Welcome to Medicare, initial annual wellness or annual wellness exam)  Virtual Visit via Phone Note  I connected with [name] on 03/22/23 by phone and verified that I am speaking with the correct person using two identifiers.  Location patient: home Location provider:work or home office Persons participating in the virtual visit: patient, provider  Concerns and/or follow up today: doing ok - currently has sinus infection and got on antibiotic today.    See HM section in Epic for other details of completed HM.    ROS: negative for report of fevers, unintentional weight loss, vision changes, vision loss, hearing loss or change, chest pain, sob, hemoptysis, melena, hematochezia, hematuria, falls, bleeding or bruising, thoughts of suicide or self harm, memory loss  Patient-completed extensive health risk assessment -  reviewed and discussed with the  patient: See Health Risk Assessment completed with patient prior to the visit either above or in recent phone note. This was reviewed in detailed with the patient today and appropriate recommendations, orders and referrals were placed as needed per Summary below and patient instructions.   Review of Medical History: -PMH, PSH, Family History and current specialty and care providers reviewed and updated and listed below   Patient Care Team: Kristian Covey, MD as PCP - General Jake Bathe, MD as PCP - Cardiology (Cardiology) Donnetta Hail, MD as Consulting Physician (Rheumatology)   Past Medical History:  Diagnosis Date   ANGIOMA 10/03/2008   REMOVED FROM RIGHT LOWER LEG-BENIGN   Anxiety    Aortic atherosclerosis (HCC)    Arthritis    RA   Bronchitis    hx of    Cancer (HCC)    BASAL CELL SKIN CANCER   Complication of anesthesia    Coronary artery disease    COVID-19 06/18/2020   HA, chills, myalgias, congestion & fever, all symptoms resolved as of 12/29/20 per patient   Depression    Fibromyalgia    GERD 08/27/2008   History of blood transfusion    1995   History of hiatal hernia    hx of has had surgically repaired   History of measles    History of mumps    History of nonmelanoma skin cancer    HYPERLIPIDEMIA 08/27/2008   HYPERTENSION 08/27/2008   pt is currently on no meds    Pneumonia    hx of    PONV (postoperative nausea and vomiting)    Rheumatoid arthritis (HCC)    SEBORRHEIC KERATOSIS 08/27/2008   Sinus headache    Vocal cord paresis    hx of    Wears glasses    Wears partial dentures     Past Surgical History:  Procedure Laterality Date   ABDOMINAL HYSTERECTOMY  1995   with appendectomy   BREAST BIOPSY Left 2013   BREAST DUCTAL SYSTEM EXCISION  08/16/2011   Procedure: EXCISION DUCTAL SYSTEM BREAST;  Surgeon: Maisie Fus A. Cornett, MD;  Location: [facility];  Service: General;  Laterality: Left;  left breast duct excision    CARPAL TUNNEL RELEASE Right 01/01/2021   Procedure: CARPAL TUNNEL RELEASE;  Surgeon: Gomez Cleverly, MD;  Location: [facility];  Service: Orthopedics;  Laterality: Right;   CESAREAN SECTION  1980   times 2; 1980 and 1982   CESAREAN SECTION  1982   CHOLECYSTECTOMY  1997   COLONOSCOPY  09/15/2018   EXCISION/RELEASE BURSA HIP Right 10/21/2014   Procedure: OPEN RIGHT HIP BURSECTOMY, EXOSECTOMY;  Surgeon: Durene Romans, MD;  Location: [facility];  Service: Orthopedics;  Laterality: Right;   foot surgery  2004, 2005, 2008   bilat secondary to neuropathy    JOINT REPLACEMENT  11/2010   lt total knee X2   KNEE ARTHROSCOPY  2007, 2008, 2009   LAPAROSCOPIC GASTRIC SLEEVE RESECTION N/A 10/30/2013   Procedure: LAPAROSCOPIC GASTRIC SLEEVE RESECTION WITH HIATAL HERNIA REPAIR AND UPPER ENDOSCOPY;  Surgeon: Gaynelle Adu, MD;  Location: [facility];  Service: General;  Laterality: N/A;   NASAL SINUS SURGERY  2013   OPEN SURGICAL REPAIR OF GLUTEAL TENDON  01/17/2012   Procedure: OPEN SURGICAL REPAIR OF GLUTEAL TENDON;  Surgeon: Shelda Pal, MD;  Location: [facility];  Service: Orthopedics;  Laterality: Right;   REVERSE SHOULDER ARTHROPLASTY Left 02/18/2022   Procedure: REVERSE SHOULDER ARTHROPLASTY;  Surgeon: Francena Hanly, MD;  Location: [facility];  Service: Orthopedics;  Laterality: Left;   ROTATOR CUFF REPAIR Bilateral 2020   2021   TOTAL KNEE ARTHROPLASTY Right 04/15/2020   Procedure: TOTAL KNEE ARTHROPLASTY;  Surgeon: Durene Romans, MD;  Location: [facility];  Service: Orthopedics;  Laterality: Right;  70 mins   ULNAR TUNNEL RELEASE Right 01/01/2021   Procedure: CUBITAL TUNNEL RELEASE;  Surgeon: Gomez Cleverly, MD;  Location: [facility];  Service: Orthopedics;  Laterality: Right;  with MAC anesthesia needs 45 minutes    Social History   Socioeconomic History   Marital status: Married    Spouse name: Not on file   Number of children: 2   Years of education: Not on file   Highest  education level: 12th grade  Occupational History   Occupation: retired    Comment: Arts administrator  Tobacco Use   Smoking status: Former    Current packs/day: 0.00    Average packs/day: 0.3 packs/day for 4.0 years (1.0 ttl pk-yrs)    Types: Cigarettes    Start date: 02/23/1980    Quit date: 02/23/1984    Years since quitting: 39.1   Smokeless tobacco: Never  Vaping Use   Vaping status: Never Used  Substance and Sexual Activity   Alcohol use: No   Drug use: No   Sexual activity: Not on file  Other Topics Concern   Not on file  Social History Narrative   Lives at home with husband   Disabled   Right handed   Caffeine: 1 cup of coffee daily   Social Drivers of Corporate investment banker Strain: Low Risk  (03/22/2022)   Overall Financial Resource Strain (CARDIA)    Difficulty of Paying Living Expenses: Not hard at all  Food Insecurity: No Food Insecurity (03/22/2022)   Hunger Vital Sign    Worried About Running Out of Food in the Last Year: Never true    Ran Out of Food in the Last Year: Never true  Transportation Needs: No Transportation Needs (03/22/2022)   PRAPARE - Administrator, Civil Service (Medical): No    Lack of Transportation (Non-Medical): No  Physical Activity: Insufficiently Active (03/22/2022)   Exercise Vital Sign    Days of Exercise per Week: 3 days    Minutes of Exercise per Session: 20 min  Stress: No Stress Concern Present (03/22/2022)   [employer] of Occupational Health - Occupational Stress Questionnaire    Feeling of Stress : Not at all  Social Connections: Socially Integrated (03/22/2022)   Social Connection and Isolation Panel [NHANES]    Frequency of Communication with Friends and Family: More than three times a week    Frequency of Social Gatherings with Friends and Family: Once a week    Attends Religious Services: 1 to 4 times per year    Active Member of [employer] or Organizations: Yes    Attends Banker Meetings:  1 to 4 times per year    Marital Status: Married  Catering manager Violence: Not At Risk (07/11/2020)   Humiliation, Afraid, Rape, and Kick questionnaire    Fear of Current or Ex-Partner: No    Emotionally Abused: No    Physically Abused: No    Sexually Abused: No    Family History  Problem Relation Age of Onset   Cancer Mother        breast   Breast cancer Mother 64   Kidney disease Father    Epilepsy  Father    Heart attack Sister    Heart attack Sister    Diabetes Son     Current Outpatient Medications on File Prior to Visit  Medication Sig Dispense Refill   abatacept (ORENCIA) 250 MG injection Inject 750 mg into the vein every 30 (thirty) days.     amLODipine (NORVASC) 5 MG tablet Take 1 tablet (5 mg total) by mouth daily. 30 tablet 5   B-D TB SYRINGE 1CC/27GX1/2" 27G X 1/2" 1 ML MISC      Biotin 10 MG CAPS Take 10 mg by mouth daily.     cyclobenzaprine (FLEXERIL) 10 MG tablet Take 1 tablet (10 mg total) by mouth 3 (three) times daily as needed for muscle spasms. 30 tablet 1   EPINEPHrine (EPIPEN 2-PAK) 0.3 mg/0.3 mL IJ SOAJ injection Inject 0.3 mLs (0.3 mg total) into the muscle once. 2 each 1   famotidine (PEPCID) 20 MG tablet Take 1 tablet (20 mg total) by mouth 2 (two) times daily. 60 tablet 1   fluticasone (FLONASE) 50 MCG/ACT nasal spray Place 2 sprays into both nostrils daily. 16 g 0   folic acid (FOLVITE) 800 MCG tablet Take 1,600 mcg by mouth daily.     lisinopril (ZESTRIL) 10 MG tablet TAKE ONE TABLET DAILY 90 tablet 0   Methotrexate 25 MG/ML SOSY Inject 25 mg into the skin every 7 (seven) days.     ondansetron (ZOFRAN-ODT) 4 MG disintegrating tablet Take 1 tablet (4 mg total) by mouth every 8 (eight) hours as needed. 20 tablet 0   oxyCODONE (OXY IR/ROXICODONE) 5 MG immediate release tablet Take 1 tablet (5 mg total) by mouth every 4 (four) hours as needed for pain. Stop Percocet due to elevated liver enzymes. 180 tablet 0   oxyCODONE (OXY IR/ROXICODONE) 5 MG  immediate release tablet Take 1 tablet (5 mg total) by mouth every 4 (four) hours as needed for pain. 180 tablet 0   oxyCODONE (OXY IR/ROXICODONE) 5 MG immediate release tablet Take 1 tablet by mouth every four hours as needed for pain. stop percocet due to elevated liver enzymes 180 tablet 0   oxyCODONE (OXY IR/ROXICODONE) 5 MG immediate release tablet Take 1 tablet by mouth every four hours as needed for pain. stop percocet due to elevated liver enzymes 180 tablet 0   polyethylene glycol (MIRALAX / GLYCOLAX) 17 g packet Take 17 g by mouth daily.     predniSONE (DELTASONE) 10 MG tablet Take 10 mg by mouth daily with breakfast.     rosuvastatin (CRESTOR) 20 MG tablet TAKE 1 TABLET DAILY 30 tablet 0   sertraline (ZOLOFT) 100 MG tablet TAKE ONE TABLET DAILY 90 tablet 0   Tavaborole (KERYDIN) 5 % SOLN Apply 1 drop topically daily. Apply 1 drop to the toenail daily. 10 mL 0   zolpidem (AMBIEN) 10 MG tablet TAKE ONE TABLET AT BEDTIME AS NEEDED FOR SLEEP 90 tablet 1   No current facility-administered medications on file prior to visit.    Allergies  Allergen Reactions   Bee Venom Anaphylaxis   Keflex [Cephalexin] Hives    Pt took po kelfex for sinus infection, developed blisters over entire body   Codeine Sulfate Other (See Comments)    GI upset   Penicillamine Other (See Comments)   Penicillins Rash       Physical Exam Vitals requested from patient and listed below if patient had equipment and was able to obtain at home for this virtual visit: There were no vitals filed  for this visit. Estimated body mass index is 36.87 kg/m as calculated from the following:   Height as of 11/30/22: 5\' 4"  (1.626 m).   Weight as of 11/30/22: 214 lb 12.8 oz (97.4 kg).  EKG (optional): deferred due to virtual visit  GENERAL: alert, oriented, no acute distress detected, full vision exam deferred due to pandemic and/or virtual encounter  PSYCH/NEURO: pleasant and cooperative, no obvious depression or  anxiety, speech and thought processing grossly intact, Cognitive function grossly intact  Flowsheet Row Office Visit from 06/10/2021 in [facility]  PHQ-9 Total Score 0           03/22/2023    5:36 PM 03/01/2022    2:26 PM 07/14/2021   11:37 AM 06/10/2021    4:11 PM 07/11/2020    3:46 PM  Depression screen PHQ 2/9  Decreased Interest 0 0 0 0 0  Down, Depressed, Hopeless 0 0 0 0 0  PHQ - 2 Score 0 0 0 0 0  Altered sleeping    0   Tired, decreased energy    0   Change in appetite    0   Feeling bad or failure about yourself     0   Trouble concentrating    0   Moving slowly or fidgety/restless    0   Suicidal thoughts    0   PHQ-9 Score    0        02/23/2022   11:41 AM 03/01/2022    2:26 PM 03/22/2022    3:55 PM 03/20/2023   11:30 AM 03/22/2023    5:36 PM  Fall Risk  Falls in the past year?  0 0 0 0  Was there an injury with Fall?  0  0 0  Fall Risk Category Calculator  0  0  0  Fall Risk Category (Retired)  Low     (RETIRED) Patient Fall Risk Level Low fall risk Low fall risk     Patient at Risk for Falls Due to  No Fall Risks     Fall risk Follow up  Falls evaluation completed   Falls evaluation completed;Education provided     Patient-reported     SUMMARY AND PLAN:  Encounter for Medicare annual wellness exam  Encounter for screening mammogram for malignant neoplasm of breast - Plan: MM 3D SCREENING MAMMOGRAM BILATERAL BREAST  Discussed applicable health maintenance/preventive health measures and advised and referred or ordered per patient preferences: -she plans to call to schedule her mammogram, sent order today -she reports she had the covid and tetanus booster Health Maintenance  Topic Date Due   DTaP/Tdap/Td (3 - Td or Tdap) 08/29/2019   MAMMOGRAM  01/22/2022   COVID-19 Vaccine (8 - 2024-25 season) 01/18/2023   Medicare Annual Wellness (AWV)  03/21/2024   Colonoscopy  09/14/2028   Pneumonia Vaccine 59+ Years old  Completed    INFLUENZA VACCINE  Completed   DEXA SCAN  Completed   Hepatitis C Screening  Completed   Zoster Vaccines- Shingrix  Completed   HPV VACCINES  Aged [employer] and counseling on the following was provided based on the above review of health and a plan/checklist for the patient, along with additional information discussed, was provided for the patient in the patient instructions :  -Advised on importance of completing advanced directives, discussed options for completing and provided information in patient instructions as well -Provided  safe balance exercises that can be  done at home to improve balance and discussed exercise guidelines for adults with include balance exercises at least 3 days per week - see patient instructions. -Advised and counseled on a healthy lifestyle - including the importance of a healthy diet, regular physical activity, social connections  -Reviewed patient's current diet. Advised and counseled on a whole foods based healthy diet. A summary of a healthy diet was provided in the Patient Instructions.  -reviewed patient's current physical activity level and discussed exercise guidelines for adults. Discussed community resources and ideas for safe exercise at home to assist in meeting exercise guideline recommendations in a safe and healthy way.  -Advise yearly dental visits at minimum and regular eye exams  Follow up: see patient instructions     Patient Instructions  I really enjoyed getting to talk with you today! I am available on Tuesdays and Thursdays for virtual visits if you have any questions or concerns, or if I can be of any further assistance.   CHECKLIST FROM ANNUAL WELLNESS VISIT:  -Follow up (please call to schedule if not scheduled after visit):   -yearly for annual wellness visit with primary care office  Here is a list of your preventive care/health maintenance measures and the plan for each if any are due:  PLAN For any measures below  that may be due:  -please call to schedule you mammogram -please obtain records of your tetanus and covid vaccines if you can to provide to our office so that we can update your records  Health Maintenance  Topic Date Due   DTaP/Tdap/Td (3 - Td or Tdap) 08/29/2019   MAMMOGRAM  01/22/2022   COVID-19 Vaccine (8 - 2024-25 season) 01/18/2023   Medicare Annual Wellness (AWV)  03/21/2024   Colonoscopy  09/14/2028   Pneumonia Vaccine 98+ Years old  Completed   INFLUENZA VACCINE  Completed   DEXA SCAN  Completed   Hepatitis C Screening  Completed   Zoster Vaccines- Shingrix  Completed   HPV VACCINES  Aged Out    -See a dentist at least yearly  -Get your eyes checked and then per your eye specialist's recommendations  -Other issues addressed today:   -I have included below further information regarding a healthy whole foods based diet, physical activity guidelines for adults, stress management and opportunities for social connections. I hope you find this information useful.   -----------------------------------------------------------------------------------------------------------------------------------------------------------------------------------------------------------------------------------------------------------    NUTRITION: -eat real food: lots of colorful vegetables (half the plate) and fruits -5-7 servings of vegetables and fruits per day (fresh or steamed is best), exp. 2 servings of vegetables with lunch and dinner and 2 servings of fruit per day. Berries and greens such as kale and collards are great choices.  -consume on a regular basis:  fresh fruits, fresh veggies, fish, nuts, seeds, healthy oils (such as olive oil, avocado oil), whole grains (make sure first ingredient on label contains the word "whole"), -can eat small amounts of dairy and lean meat (no larger than the palm of your hand), but avoid processed meats such as ham, bacon, lunch meat, etc. -drink  water -try to avoid fast food and pre-packaged foods, processed meat, ultra processed foods (donuts, candy, etc.) -most experts advise limiting sodium to < 2300mg  per day, should limit further is any chronic conditions such as high blood pressure, heart disease, diabetes, etc. The American Heart Association advised that < 1500mg  is is ideal -try to avoid foods that contain any ingredients with names you do not recognize  -try to avoid  foods with added sugar or sweeteners/sweets  -try to avoid sweet drinks -try to avoid white rice, white bread, pasta (unless whole grain)  EXERCISE GUIDELINES FOR ADULTS: -if you wish to increase your physical activity, do so gradually and with the approval of your doctor -STOP and seek medical care immediately if you have any chest pain, chest discomfort or trouble breathing when starting or increasing exercise  -move and stretch your body, legs, feet and arms when sitting for long periods -Physical activity guidelines for optimal health in adults: -get at least 150 minutes per week of moderate exercise (can talk, but not sing); this is about 20-30 minutes of sustained activity 5-7 days per week or two 10-15 minute episodes of sustained activity 5-7 days per week -do some muscle building/resistance training at least 2 days per week  -balance exercises 3+ days per week:   Stand somewhere where you have something sturdy to hold onto if you lose balance.    1) lift up on toes, start with 5x per day and work up to 20x   2) stand and lift on leg straight out to the side so that foot is a few inches of the floor, start with 5x each side and work up to 20x each side   3) stand on one foot, start with 5 seconds each side and work up to 20 seconds on each side  If you need ideas or help with getting more active:  -Silver sneakers https://tools.silversneakers.com  -Walk with a Doc: http://www.duncan-williams.com/  -try to include resistance (weight lifting/strength  building) and balance exercises twice per week: or the following link for ideas: http://castillo-powell.com/  BuyDucts.dk  STRESS MANAGEMENT: -can try meditating, or just sitting quietly with deep breathing while intentionally relaxing all parts of your body for 5 minutes daily -if you need further help with stress, anxiety or depression please follow up with your primary doctor or contact the wonderful folks at [facility]: [phone]  SOCIAL CONNECTIONS: -options in [place] if you wish to engage in more social and exercise related activities:  -Silver sneakers https://tools.silversneakers.com  -Walk with a Doc: http://www.duncan-williams.com/  -Check out the [facility] Active Adults 50+ section on the [place] [employer] (hiking clubs, book clubs, cards and games, chess, exercise classes, aquatic classes and much more) - see the website for details: https://www.[place].gov/departments/parks-recreation/active-adults50  -YouTube has lots of exercise videos for different ages and abilities as well  -[facility] (a variety of indoor and outdoor inperson activities for adults). [phone]. [address]  -Virtual Online Classes (a variety of topics): see seniorplanet.org or call [phone]  -consider volunteering at a school, hospice center, church, senior center or elsewhere          ADVANCED HEALTHCARE DIRECTIVES:  [place] Advanced Directives assistance:   ExpressWeek.com.cy  Everyone should have advanced health care directives in place. This is so that you get the care you want, should you ever be in a situation where you are unable to make your own medical decisions.   From the [place] Advanced Directive Website: "Advance Health Care Directives are legal documents in which you give  written instructions about your health care if, in the future, you cannot speak for yourself.   A health care power of attorney allows you to name a person you trust to make your health care decisions if you cannot make them yourself. A declaration of a desire for a natural death (or living will) is document, which states that you desire not  to have your life prolonged by extraordinary measures if you have a terminal or incurable illness or if you are in a vegetative state. An advance instruction for mental health treatment makes a declaration of instructions, information and preferences regarding your mental health treatment. It also states that you are aware that the advance instruction authorizes a mental health treatment provider to act according to your wishes. It may also outline your consent or refusal of mental health treatment. A declaration of an anatomical gift allows anyone over the age of 74 to make a gift by will, organ donor card or other document."   Please see the following website or an elder law attorney for forms, FAQs and for completion of advanced directives: [place] TEFL teacher Health Care Directives Advance Health Care Directives ([email or web address]/)  Or copy and paste the following to your web browser: [email or web address]    Terressa Koyanagi, DO

## 2023-03-22 NOTE — Patient Instructions (Addendum)
I really enjoyed getting to talk with you today! I am available on Tuesdays and Thursdays for virtual visits if you have any questions or concerns, or if I can be of any further assistance.   CHECKLIST FROM ANNUAL WELLNESS VISIT:  -Follow up (please call to schedule if not scheduled after visit):   -yearly for annual wellness visit with primary care office  Here is a list of your preventive care/health maintenance measures and the plan for each if any are due:  PLAN For any measures below that may be due:  -please call to schedule you mammogram -please obtain records of your tetanus and covid vaccines if you can to provide to our office so that we can update your records  Health Maintenance  Topic Date Due   DTaP/Tdap/Td (3 - Td or Tdap) 08/29/2019   MAMMOGRAM  01/22/2022   COVID-19 Vaccine (8 - 2024-25 season) 01/18/2023   Medicare Annual Wellness (AWV)  03/21/2024   Colonoscopy  09/14/2028   Pneumonia Vaccine 26+ Years old  Completed   INFLUENZA VACCINE  Completed   DEXA SCAN  Completed   Hepatitis C Screening  Completed   Zoster Vaccines- Shingrix  Completed   HPV VACCINES  Aged Out    -See a dentist at least yearly  -Get your eyes checked and then per your eye specialist's recommendations  -Other issues addressed today:   -I have included below further information regarding a healthy whole foods based diet, physical activity guidelines for adults, stress management and opportunities for social connections. I hope you find this information useful.   -----------------------------------------------------------------------------------------------------------------------------------------------------------------------------------------------------------------------------------------------------------    NUTRITION: -eat real food: lots of colorful vegetables (half the plate) and fruits -5-7 servings of vegetables and fruits per day (fresh or steamed is best), exp. 2 servings  of vegetables with lunch and dinner and 2 servings of fruit per day. Berries and greens such as kale and collards are great choices.  -consume on a regular basis:  fresh fruits, fresh veggies, fish, nuts, seeds, healthy oils (such as olive oil, avocado oil), whole grains (make sure first ingredient on label contains the word "whole"), -can eat small amounts of dairy and lean meat (no larger than the palm of your hand), but avoid processed meats such as ham, bacon, lunch meat, etc. -drink water -try to avoid fast food and pre-packaged foods, processed meat, ultra processed foods (donuts, candy, etc.) -most experts advise limiting sodium to < 2300mg  per day, should limit further is any chronic conditions such as high blood pressure, heart disease, diabetes, etc. The American Heart Association advised that < 1500mg  is is ideal -try to avoid foods that contain any ingredients with names you do not recognize  -try to avoid foods with added sugar or sweeteners/sweets  -try to avoid sweet drinks -try to avoid white rice, white bread, pasta (unless whole grain)  EXERCISE GUIDELINES FOR ADULTS: -if you wish to increase your physical activity, do so gradually and with the approval of your doctor -STOP and seek medical care immediately if you have any chest pain, chest discomfort or trouble breathing when starting or increasing exercise  -move and stretch your body, legs, feet and arms when sitting for long periods -Physical activity guidelines for optimal health in adults: -get at least 150 minutes per week of moderate exercise (can talk, but not sing); this is about 20-30 minutes of sustained activity 5-7 days per week or two 10-15 minute episodes of sustained activity 5-7 days per week -do some muscle building/resistance  training at least 2 days per week  -balance exercises 3+ days per week:   Stand somewhere where you have something sturdy to hold onto if you lose balance.    1) lift up on toes, start  with 5x per day and work up to 20x   2) stand and lift on leg straight out to the side so that foot is a few inches of the floor, start with 5x each side and work up to 20x each side   3) stand on one foot, start with 5 seconds each side and work up to 20 seconds on each side  If you need ideas or help with getting more active:  -Silver sneakers https://tools.silversneakers.com  -Walk with a Doc: http://www.duncan-williams.com/  -try to include resistance (weight lifting/strength building) and balance exercises twice per week: or the following link for ideas: http://castillo-powell.com/  BuyDucts.dk  STRESS MANAGEMENT: -can try meditating, or just sitting quietly with deep breathing while intentionally relaxing all parts of your body for 5 minutes daily -if you need further help with stress, anxiety or depression please follow up with your primary doctor or contact the wonderful folks at [facility]: [phone]  SOCIAL CONNECTIONS: -options in [place] if you wish to engage in more social and exercise related activities:  -Silver sneakers https://tools.silversneakers.com  -Walk with a Doc: http://www.duncan-williams.com/  -Check out the [facility] Active Adults 50+ section on the [place] [employer] (hiking clubs, book clubs, cards and games, chess, exercise classes, aquatic classes and much more) - see the website for details: https://www.[place].gov/departments/parks-recreation/active-adults50  -YouTube has lots of exercise videos for different ages and abilities as well  -[facility] (a variety of indoor and outdoor inperson activities for adults). [phone]. [address]  -Virtual Online Classes (a variety of topics): see seniorplanet.org or call [phone]  -consider volunteering at a school, hospice center, church, senior center or  elsewhere          ADVANCED HEALTHCARE DIRECTIVES:  [place] Advanced Directives assistance:   ExpressWeek.com.cy  Everyone should have advanced health care directives in place. This is so that you get the care you want, should you ever be in a situation where you are unable to make your own medical decisions.   From the [place] Advanced Directive Website: "Advance Health Care Directives are legal documents in which you give written instructions about your health care if, in the future, you cannot speak for yourself.   A health care power of attorney allows you to name a person you trust to make your health care decisions if you cannot make them yourself. A declaration of a desire for a natural death (or living will) is document, which states that you desire not to have your life prolonged by extraordinary measures if you have a terminal or incurable illness or if you are in a vegetative state. An advance instruction for mental health treatment makes a declaration of instructions, information and preferences regarding your mental health treatment. It also states that you are aware that the advance instruction authorizes a mental health treatment provider to act according to your wishes. It may also outline your consent or refusal of mental health treatment. A declaration of an anatomical gift allows anyone over the age of 66 to make a gift by will, organ donor card or other document."   Please see the following website or an elder law attorney for forms, FAQs and for completion of advanced directives: [place] [employer]  Advance Health Care Directives ([email or web address]/)  Or copy and paste the following to your web browser: [email or web address]

## 2023-04-11 ENCOUNTER — Ambulatory Visit (INDEPENDENT_AMBULATORY_CARE_PROVIDER_SITE_OTHER): Payer: Medicare Other | Admitting: Family Medicine

## 2023-04-11 VITALS — BP 130/76 | HR 89 | Temp 98.6°F | Wt 212.0 lb

## 2023-04-11 DIAGNOSIS — R059 Cough, unspecified: Secondary | ICD-10-CM | POA: Diagnosis not present

## 2023-04-11 DIAGNOSIS — J0191 Acute recurrent sinusitis, unspecified: Secondary | ICD-10-CM

## 2023-04-11 LAB — POCT INFLUENZA A/B
Influenza A, POC: NEGATIVE
Influenza B, POC: NEGATIVE

## 2023-04-11 MED ORDER — DOXYCYCLINE HYCLATE 100 MG PO CAPS
100.0000 mg | ORAL_CAPSULE | Freq: Two times a day (BID) | ORAL | 0 refills | Status: DC
Start: 2023-04-11 — End: 2023-09-14

## 2023-04-11 NOTE — Progress Notes (Signed)
Established Patient Office Visit  Subjective   Patient ID: IDY RAWLING, female    DOB: 1955/12/27  Age: 68 y.o. MRN: [id number]  Chief Complaint  Patient presents with   Cough    Patient complains of productive cough, x3 days, Tried Tylenol     HPI   Duncan Dull is seen with acute respiratory illness.  Onset this past Friday.  She works at [employer]  She states several residents there recently had COVID or flu.  She started Friday with bodyaches, cough, headaches.  Body aches have particularly been bothersome.  No nausea or vomiting or diarrhea.  Fever was up to 102 over the weekend but down today.  Still has malaise.  Past Medical History:  Diagnosis Date   ANGIOMA 10/03/2008   REMOVED FROM RIGHT LOWER LEG-BENIGN   Anxiety    Aortic atherosclerosis (HCC)    Arthritis    RA   Bronchitis    hx of    Cancer (HCC)    BASAL CELL SKIN CANCER   Complication of anesthesia    Coronary artery disease    COVID-19 06/18/2020   HA, chills, myalgias, congestion & fever, all symptoms resolved as of 12/29/20 per patient   Depression    Fibromyalgia    GERD 08/27/2008   History of blood transfusion    1995   History of hiatal hernia    hx of has had surgically repaired   History of measles    History of mumps    History of nonmelanoma skin cancer    HYPERLIPIDEMIA 08/27/2008   HYPERTENSION 08/27/2008   pt is currently on no meds    Pneumonia    hx of    PONV (postoperative nausea and vomiting)    Rheumatoid arthritis (HCC)    SEBORRHEIC KERATOSIS 08/27/2008   Sinus headache    Vocal cord paresis    hx of    Wears glasses    Wears partial dentures    Past Surgical History:  Procedure Laterality Date   ABDOMINAL HYSTERECTOMY  1995   with appendectomy   BREAST BIOPSY Left 2013   BREAST DUCTAL SYSTEM EXCISION  08/16/2011   Procedure: EXCISION DUCTAL SYSTEM BREAST;  Surgeon: Maisie Fus A. Cornett, MD;  Location: [facility];  Service: General;   Laterality: Left;  left breast duct excision   CARPAL TUNNEL RELEASE Right 01/01/2021   Procedure: CARPAL TUNNEL RELEASE;  Surgeon: Gomez Cleverly, MD;  Location: [facility];  Service: Orthopedics;  Laterality: Right;   CESAREAN SECTION  1980   times 2; 1980 and 1982   CESAREAN SECTION  1982   CHOLECYSTECTOMY  1997   COLONOSCOPY  09/15/2018   EXCISION/RELEASE BURSA HIP Right 10/21/2014   Procedure: OPEN RIGHT HIP BURSECTOMY, EXOSECTOMY;  Surgeon: Durene Romans, MD;  Location: [facility];  Service: Orthopedics;  Laterality: Right;   foot surgery  2004, 2005, 2008   bilat secondary to neuropathy    JOINT REPLACEMENT  11/2010   lt total knee X2   KNEE ARTHROSCOPY  2007, 2008, 2009   LAPAROSCOPIC GASTRIC SLEEVE RESECTION N/A 10/30/2013   Procedure: LAPAROSCOPIC GASTRIC SLEEVE RESECTION WITH HIATAL HERNIA REPAIR AND UPPER ENDOSCOPY;  Surgeon: Gaynelle Adu, MD;  Location: [facility];  Service: General;  Laterality: N/A;   NASAL SINUS SURGERY  2013   OPEN SURGICAL REPAIR OF GLUTEAL TENDON  01/17/2012   Procedure: OPEN SURGICAL REPAIR OF GLUTEAL TENDON;  Surgeon: Shelda Pal, MD;  Location: [facility];  Service: Orthopedics;  Laterality: Right;   REVERSE SHOULDER ARTHROPLASTY Left 02/18/2022   Procedure: REVERSE SHOULDER ARTHROPLASTY;  Surgeon: Francena Hanly, MD;  Location: [facility];  Service: Orthopedics;  Laterality: Left;   ROTATOR CUFF REPAIR Bilateral 2020   2021   TOTAL KNEE ARTHROPLASTY Right 04/15/2020   Procedure: TOTAL KNEE ARTHROPLASTY;  Surgeon: Durene Romans, MD;  Location: [facility];  Service: Orthopedics;  Laterality: Right;  70 mins   ULNAR TUNNEL RELEASE Right 01/01/2021   Procedure: CUBITAL TUNNEL RELEASE;  Surgeon: Gomez Cleverly, MD;  Location: [facility];  Service: Orthopedics;  Laterality: Right;  with MAC anesthesia needs 45 minutes    reports that she quit smoking about 39 years ago. Her smoking use included cigarettes. She started smoking about 43 years  ago. She has a 1 pack-year smoking history. She has never used smokeless tobacco. She reports that she does not drink alcohol and does not use drugs. family history includes Breast cancer (age of onset: 47) in her mother; Cancer in her mother; Diabetes in her son; Epilepsy in her father; Heart attack in her sister and sister; Kidney disease in her father. Allergies  Allergen Reactions   Bee Venom Anaphylaxis   Keflex [Cephalexin] Hives    Pt took po kelfex for sinus infection, developed blisters over entire body   Codeine Sulfate Other (See Comments)    GI upset   Penicillamine Other (See Comments)   Penicillins Rash      Review of Systems  Constitutional:  Positive for malaise/fatigue.  Eyes:  Negative for blurred vision.  Respiratory:  Positive for cough.   Cardiovascular:  Negative for chest pain.  Musculoskeletal:  Positive for myalgias.  Neurological:  Positive for headaches. Negative for dizziness and weakness.      Objective:     BP 130/76 (BP Location: Left Arm, Patient Position: Sitting, Cuff Size: Large)   Pulse 89   Temp 98.6 F (37 C) (Oral)   Wt 212 lb (96.2 kg)   SpO2 95%   BMI 36.39 kg/m  BP Readings from Last 3 Encounters:  04/11/23 130/76  11/30/22 132/62  11/01/22 124/76   Wt Readings from Last 3 Encounters:  04/11/23 212 lb (96.2 kg)  11/30/22 214 lb 12.8 oz (97.4 kg)  10/27/22 216 lb 6.4 oz (98.2 kg)      Physical Exam Vitals reviewed.  Constitutional:      General: She is not in acute distress.    Appearance: She is not ill-appearing.  HENT:     Right Ear: Tympanic membrane normal.     Left Ear: Tympanic membrane normal.  Cardiovascular:     Rate and Rhythm: Normal rate and regular rhythm.  Pulmonary:     Effort: Pulmonary effort is normal.     Breath sounds: Normal breath sounds. No wheezing or rales.  Musculoskeletal:     Cervical back: Neck supple.  Lymphadenopathy:     Cervical: No cervical adenopathy.  Neurological:     Mental  Status: She is alert.      No results found for any visits on 04/11/23.    The 10-year ASCVD risk score (Arnett DK, et al., 2019) is: 7.6%    Assessment & Plan:   Acute viral illness.  Patient is currently afebrile.  Influenza screen negative. Even though her influenza screen is negative she has concerns regarding acute sinusitis.  She has had multiple bouts previously and also prior history of sinus surgery.  She is starting to get facial  pain and headaches and colored nasal discharge.  -Continue Tylenol or Advil as needed for body aches and headaches -Continue plenty of fluids and rest -Follow-up for any persistent or worsening symptoms. -Send in doxycycline 100 mg twice daily for 10 days given sinusitis symptoms above.  She is penicillin allergic.  Evelena Peat, MD

## 2023-04-19 ENCOUNTER — Other Ambulatory Visit (HOSPITAL_COMMUNITY): Payer: Self-pay

## 2023-04-19 DIAGNOSIS — G5603 Carpal tunnel syndrome, bilateral upper limbs: Secondary | ICD-10-CM | POA: Diagnosis not present

## 2023-04-19 DIAGNOSIS — M47812 Spondylosis without myelopathy or radiculopathy, cervical region: Secondary | ICD-10-CM | POA: Diagnosis not present

## 2023-04-19 DIAGNOSIS — G894 Chronic pain syndrome: Secondary | ICD-10-CM | POA: Diagnosis not present

## 2023-04-19 DIAGNOSIS — K5903 Drug induced constipation: Secondary | ICD-10-CM | POA: Diagnosis not present

## 2023-04-19 MED ORDER — OXYCODONE HCL 5 MG PO TABS
5.0000 mg | ORAL_TABLET | ORAL | 0 refills | Status: DC | PRN
  Filled 2023-05-17: qty 180, 30d supply, fill #0

## 2023-04-19 MED ORDER — OXYCODONE HCL 5 MG PO TABS
5.0000 mg | ORAL_TABLET | ORAL | 0 refills | Status: DC | PRN
  Filled 2023-04-19: qty 180, 30d supply, fill #0

## 2023-04-21 ENCOUNTER — Other Ambulatory Visit: Payer: Self-pay | Admitting: Family Medicine

## 2023-04-21 DIAGNOSIS — M0589 Other rheumatoid arthritis with rheumatoid factor of multiple sites: Secondary | ICD-10-CM | POA: Diagnosis not present

## 2023-04-25 ENCOUNTER — Ambulatory Visit: Payer: Medicare Other | Admitting: Podiatry

## 2023-04-27 ENCOUNTER — Encounter: Payer: Self-pay | Admitting: Podiatry

## 2023-04-28 ENCOUNTER — Other Ambulatory Visit: Payer: Self-pay | Admitting: Family Medicine

## 2023-04-28 ENCOUNTER — Encounter: Payer: Self-pay | Admitting: Podiatry

## 2023-04-28 ENCOUNTER — Ambulatory Visit (INDEPENDENT_AMBULATORY_CARE_PROVIDER_SITE_OTHER)

## 2023-04-28 ENCOUNTER — Ambulatory Visit (INDEPENDENT_AMBULATORY_CARE_PROVIDER_SITE_OTHER): Admitting: Podiatry

## 2023-04-28 ENCOUNTER — Other Ambulatory Visit: Payer: Self-pay | Admitting: Cardiology

## 2023-04-28 DIAGNOSIS — Z1231 Encounter for screening mammogram for malignant neoplasm of breast: Secondary | ICD-10-CM

## 2023-04-28 DIAGNOSIS — M79672 Pain in left foot: Secondary | ICD-10-CM

## 2023-04-28 DIAGNOSIS — M7752 Other enthesopathy of left foot: Secondary | ICD-10-CM | POA: Diagnosis not present

## 2023-04-28 DIAGNOSIS — M7751 Other enthesopathy of right foot: Secondary | ICD-10-CM | POA: Diagnosis not present

## 2023-04-28 NOTE — Progress Notes (Signed)
 Subjective: Chief Complaint  Patient presents with   Foot Pain    RM#12 Patient presents today with left foot pain about 3 days with no injury to foot has had previous surgical history to left foot. Pain hurts on the outside of foot and radiates into leg area above ankle.   68 year old female presents the office with above concerns.  She states the last couple days she has had increased pain to the lateral aspect of the left foot foot along the fifth MPJ area.  This is a localized swelling but no redness or warmth.  No injuries that she reports.  She also still gets pain to the right foot although doing better the injection did help.  No injuries to the right foot either.  Objective: AAO x3, NAD DP/PT pulses palpable bilaterally, CRT less than 3 seconds Tenderness to palpation of the left fifth MTPJ area although the metatarsals the previously been resected.  Some localized edema but there is no erythema, warmth or any signs of infection today.  There are no open lesions.  There is normal sinus tarsi on the right foot.  No erythema continues. No pain with calf compression, swelling, warmth, erythema  Assessment: Capsulitis right foot, bursitis left foot  Plan: -All treatment options discussed with the patient including all alternatives, risks, complications.  -X-rays were obtained reviewed of the left foot.  Status post prior metatarsal head resections with medial arthrodesis.  There is no evidence of acute fracture. -We discussed steroid injection bilateral and she like to proceed with this.  I cleaned the skin bilaterally with alcohol as well as Betadine.  The left side I injected on the fifth metatarsal head area on the area of swelling to the right sinus tarsi.  I injected 1 cc Kenalog 10, 0.5 cc of Marcaine plain, 0.5 cc of lidocaine plain without complications.  Postinjection care discussed. -Continue shoes, good support. -If symptoms persist consider advanced imaging. -Patient  encouraged to call the office with any questions, concerns, change in symptoms.   No follow-ups on file.  Vivi Barrack DPM

## 2023-04-29 ENCOUNTER — Ambulatory Visit: Admitting: Podiatry

## 2023-05-05 ENCOUNTER — Emergency Department (HOSPITAL_BASED_OUTPATIENT_CLINIC_OR_DEPARTMENT_OTHER)

## 2023-05-05 ENCOUNTER — Other Ambulatory Visit: Payer: Self-pay

## 2023-05-05 ENCOUNTER — Ambulatory Visit

## 2023-05-05 ENCOUNTER — Emergency Department (HOSPITAL_BASED_OUTPATIENT_CLINIC_OR_DEPARTMENT_OTHER)
Admission: EM | Admit: 2023-05-05 | Discharge: 2023-05-05 | Disposition: A | Discharge status: Home / Self Care | Attending: Emergency Medicine | Admitting: Emergency Medicine

## 2023-05-05 ENCOUNTER — Encounter (HOSPITAL_BASED_OUTPATIENT_CLINIC_OR_DEPARTMENT_OTHER): Payer: Self-pay | Admitting: *Deleted

## 2023-05-05 DIAGNOSIS — Z20828 Contact with and (suspected) exposure to other viral communicable diseases: Secondary | ICD-10-CM | POA: Diagnosis not present

## 2023-05-05 DIAGNOSIS — I2510 Atherosclerotic heart disease of native coronary artery without angina pectoris: Secondary | ICD-10-CM | POA: Diagnosis not present

## 2023-05-05 DIAGNOSIS — Z90710 Acquired absence of both cervix and uterus: Secondary | ICD-10-CM | POA: Diagnosis not present

## 2023-05-05 DIAGNOSIS — Z20822 Contact with and (suspected) exposure to covid-19: Secondary | ICD-10-CM | POA: Insufficient documentation

## 2023-05-05 DIAGNOSIS — R197 Diarrhea, unspecified: Secondary | ICD-10-CM | POA: Insufficient documentation

## 2023-05-05 DIAGNOSIS — I10 Essential (primary) hypertension: Secondary | ICD-10-CM | POA: Diagnosis not present

## 2023-05-05 DIAGNOSIS — Z79899 Other long term (current) drug therapy: Secondary | ICD-10-CM | POA: Insufficient documentation

## 2023-05-05 DIAGNOSIS — R109 Unspecified abdominal pain: Secondary | ICD-10-CM | POA: Diagnosis not present

## 2023-05-05 DIAGNOSIS — R112 Nausea with vomiting, unspecified: Secondary | ICD-10-CM | POA: Diagnosis not present

## 2023-05-05 LAB — CBC
HCT: 42.2 % (ref 36.0–46.0)
Hemoglobin: 13.7 g/dL (ref 12.0–15.0)
MCH: 28.8 pg (ref 26.0–34.0)
MCHC: 32.5 g/dL (ref 30.0–36.0)
MCV: 88.7 fL (ref 80.0–100.0)
Platelets: 287 10*3/uL (ref 150–400)
RBC: 4.76 MIL/uL (ref 3.87–5.11)
RDW: 15 % (ref 11.5–15.5)
WBC: 8.4 10*3/uL (ref 4.0–10.5)
nRBC: 0 % (ref 0.0–0.2)

## 2023-05-05 LAB — RESP PANEL BY RT-PCR (RSV, FLU A&B, COVID)  RVPGX2
Influenza A by PCR: NEGATIVE
Influenza B by PCR: NEGATIVE
Resp Syncytial Virus by PCR: NEGATIVE
SARS Coronavirus 2 by RT PCR: NEGATIVE

## 2023-05-05 LAB — COMPREHENSIVE METABOLIC PANEL
ALT: 34 U/L (ref 0–44)
AST: 42 U/L — ABNORMAL HIGH (ref 15–41)
Albumin: 3.9 g/dL (ref 3.5–5.0)
Alkaline Phosphatase: 93 U/L (ref 38–126)
Anion gap: 14 (ref 5–15)
BUN: 21 mg/dL (ref 8–23)
CO2: 19 mmol/L — ABNORMAL LOW (ref 22–32)
Calcium: 8.9 mg/dL (ref 8.9–10.3)
Chloride: 104 mmol/L (ref 98–111)
Creatinine, Ser: 0.49 mg/dL (ref 0.44–1.00)
GFR, Estimated: >60 mL/min (ref >60)
Glucose, Bld: 84 mg/dL (ref 70–99)
Potassium: 4 mmol/L (ref 3.5–5.1)
Sodium: 137 mmol/L (ref 135–145)
Total Bilirubin: 0.6 mg/dL (ref 0.0–1.2)
Total Protein: 6.2 g/dL — ABNORMAL LOW (ref 6.5–8.1)

## 2023-05-05 LAB — URINALYSIS, ROUTINE W REFLEX MICROSCOPIC
Bacteria, UA: NONE SEEN
Bilirubin Urine: NEGATIVE
Glucose, UA: NEGATIVE mg/dL
Ketones, ur: >80 mg/dL — AB
Leukocytes,Ua: NEGATIVE
Nitrite: NEGATIVE
Specific Gravity, Urine: 1.023 (ref 1.005–1.030)
pH: 5.5 (ref 5.0–8.0)

## 2023-05-05 LAB — LIPASE, BLOOD: Lipase: <10 U/L — ABNORMAL LOW (ref 11–51)

## 2023-05-05 LAB — MAGNESIUM: Magnesium: 1.8 mg/dL (ref 1.7–2.4)

## 2023-05-05 MED ORDER — METOCLOPRAMIDE HCL 10 MG PO TABS
10.0000 mg | ORAL_TABLET | Freq: Three times a day (TID) | ORAL | 0 refills | Status: AC | PRN
Start: 2023-05-05 — End: ?

## 2023-05-05 MED ORDER — ONDANSETRON HCL 4 MG/2ML IJ SOLN
4.0000 mg | Freq: Once | INTRAMUSCULAR | Status: AC
  Administered 2023-05-05: 4 mg via INTRAVENOUS
  Filled 2023-05-05: qty 2

## 2023-05-05 MED ORDER — IOHEXOL 300 MG/ML  SOLN
100.0000 mL | Freq: Once | INTRAMUSCULAR | Status: AC | PRN
  Administered 2023-05-05: 100 mL via INTRAVENOUS

## 2023-05-05 MED ORDER — METOCLOPRAMIDE HCL 5 MG/ML IJ SOLN
10.0000 mg | Freq: Once | INTRAMUSCULAR | Status: AC
  Administered 2023-05-05: 10 mg via INTRAVENOUS
  Filled 2023-05-05: qty 2

## 2023-05-05 MED ORDER — MORPHINE SULFATE (PF) 4 MG/ML IV SOLN
4.0000 mg | Freq: Once | INTRAVENOUS | Status: AC
  Administered 2023-05-05: 4 mg via INTRAVENOUS
  Filled 2023-05-05: qty 1

## 2023-05-05 MED ORDER — SODIUM CHLORIDE 0.9 % IV BOLUS
1000.0000 mL | Freq: Once | INTRAVENOUS | Status: AC
  Administered 2023-05-05: 1000 mL via INTRAVENOUS

## 2023-05-05 NOTE — ED Notes (Signed)
 Pt aware need for urine sample and stool sample if she has bm. Bsc set up in room with separate compartments to facilitate keeping samples separate.

## 2023-05-05 NOTE — ED Notes (Signed)
 Dc instructions reviewed with patient. Patient voiced understanding. Dc with belongings.

## 2023-05-05 NOTE — ED Provider Notes (Signed)
 [place] EMERGENCY DEPARTMENT AT [facility] Provider Note   CSN: [id number] Arrival date & time: 05/05/23  1411     History  Chief Complaint  Patient presents with   Emesis    [name] is a 68 y.o. female.  With a history of hypertension, RA, CAD, anxiety, fibromyalgia, echo on 02/08/2020 with a EF of 60 to 65% presenting to the ED for evaluation of nausea, vomiting and diarrhea.  Symptoms began approximately 5 days ago.  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  She denies any fevers.  She reports a cough which is mostly nonproductive as well as a sore throat.  She has been mostly dry heaving today due to lack of oral intake.  She has had 4 episodes of diarrhea.  She denies melena, hematochezia, hematemesis.  She denies fevers.  She reports generalized lower abdominal pain, typically right before episodes of diarrhea.  No recent travel or hospitalizations.  She was on doxycycline for sinus infection 2 weeks ago.  No recent medication changes.  She reports bilateral lower leg cramping as well.  No chest pain or shortness of breath.   Emesis Associated symptoms: abdominal pain and diarrhea        Home Medications Prior to Admission medications   Medication Sig Start Date End Date Taking? Authorizing Provider  methotrexate 50 MG/2ML injection Inject 4 mLs into the muscle once a week. 03/28/23  Yes [provider]  metoCLOPramide (REGLAN) 10 MG tablet Take 1 tablet (10 mg total) by mouth every 8 (eight) hours as needed for nausea. 05/05/23  Yes Demiyah Fischbach, Edsel Petrin, PA-C  abatacept (ORENCIA) 250 MG injection Inject 750 mg into the vein every 30 (thirty) days.    [provider]  amLODipine (NORVASC) 5 MG tablet Take 1 tablet (5 mg total) by mouth daily. 10/27/22   Burchette, Elberta Fortis, MD  B-D TB SYRINGE 1CC/27GX1/2" 27G X 1/2" 1 ML MISC  01/29/20   [provider]  Biotin 10  MG CAPS Take 10 mg by mouth daily.    [provider]  cyclobenzaprine (FLEXERIL) 10 MG tablet Take 1 tablet (10 mg total) by mouth 3 (three) times daily as needed for muscle spasms. 02/18/22   Shuford, French Ana, PA-C  doxycycline (VIBRA-TABS) 100 MG tablet Take 1 tablet (100 mg total) by mouth 2 (two) times daily. 03/22/23   Burchette, Elberta Fortis, MD  doxycycline (VIBRAMYCIN) 100 MG capsule Take 1 capsule (100 mg total) by mouth 2 (two) times daily. 04/11/23   Burchette, Elberta Fortis, MD  EPINEPHrine (EPIPEN 2-PAK) 0.3 mg/0.3 mL IJ SOAJ injection Inject 0.3 mLs (0.3 mg total) into the muscle once. 10/16/18   Burchette, Elberta Fortis, MD  famotidine (PEPCID) 20 MG tablet Take 1 tablet (20 mg total) by mouth 2 (two) times daily. 11/01/22   Sherian Maroon A, PA  fluticasone (FLONASE) 50 MCG/ACT nasal spray Place 2 sprays into both nostrils daily. 03/21/23   Cathlyn Parsons, NP  folic acid (FOLVITE) 800 MCG tablet Take 1,600 mcg by mouth daily.    [provider]  lisinopril (ZESTRIL) 10 MG tablet TAKE ONE TABLET DAILY 04/28/23   Burchette, Elberta Fortis, MD  Methotrexate 25 MG/ML SOSY Inject 25 mg into the skin every 7 (seven) days.    [provider]  ondansetron (ZOFRAN-ODT) 4 MG disintegrating tablet Take 1 tablet (4 mg total) by mouth every 8 (  eight) hours as needed. 11/01/22   Sherian Maroon A, PA  oxyCODONE (OXY IR/ROXICODONE) 5 MG immediate release tablet Take 1 tablet (5 mg total) by mouth every 4 (four) hours as needed for pain. Stop Percocet due to elevated liver enzymes. 11/24/22     oxyCODONE (OXY IR/ROXICODONE) 5 MG immediate release tablet Take 1 tablet (5 mg total) by mouth every 4 (four) hours as needed for pain. 12/23/22     oxyCODONE (OXY IR/ROXICODONE) 5 MG immediate release tablet Take 1 tablet by mouth every four hours as needed for pain. stop percocet due to elevated liver enzymes 01/27/23     oxyCODONE (OXY IR/ROXICODONE) 5 MG immediate release tablet Take 1 tablet by mouth every four  hours as needed for pain. stop percocet due to elevated liver enzymes 02/25/23     oxyCODONE (OXY IR/ROXICODONE) 5 MG immediate release tablet Take 1 tablet (5 mg total) by mouth every 4 (four) hours as needed for pain. Stop percocet due to elevated liver enzymes. 04/19/23     oxyCODONE (OXY IR/ROXICODONE) 5 MG immediate release tablet Take 1 tablet (5 mg total) by mouth every 4 (four) hours as needed for pain. Stop percocet due to elevated liver enzymes. 05/17/23     polyethylene glycol (MIRALAX / GLYCOLAX) 17 g packet Take 17 g by mouth daily.    [provider]  predniSONE (DELTASONE) 10 MG tablet Take 10 mg by mouth daily with breakfast.    [provider]  rosuvastatin (CRESTOR) 20 MG tablet Take 1 tablet (20 mg total) by mouth daily. Pt needs to call and schedule office visit for further refills - 2nd attempt 04/28/23   Jake Bathe, MD  sertraline (ZOLOFT) 100 MG tablet TAKE ONE TABLET DAILY 04/28/23   Burchette, Elberta Fortis, MD  Tavaborole (KERYDIN) 5 % SOLN Apply 1 drop topically daily. Apply 1 drop to the toenail daily. 02/21/23   Vivi Barrack, DPM  zolpidem (AMBIEN) 10 MG tablet TAKE ONE TABLET AT BEDTIME AS NEEDED FOR SLEEP 03/12/23   Burchette, Elberta Fortis, MD      Allergies    Bee venom, Keflex [cephalexin], Codeine sulfate, Penicillamine, and Penicillins    Review of Systems   Review of Systems  Gastrointestinal:  Positive for abdominal pain, diarrhea, nausea and vomiting.  All other systems reviewed and are negative.   Physical Exam Updated Vital Signs BP (!) 116/59   Pulse 70   Temp 98.3 F (36.8 C) (Oral)   Resp (!) 8   SpO2 96%  Physical Exam Vitals and nursing note reviewed.  Constitutional:      General: She is not in acute distress.    Appearance: Normal appearance. She is normal weight. She is not ill-appearing.     Comments: Resting in bed, appears uncomfortable  HENT:     Head: Normocephalic and atraumatic.  Cardiovascular:     Rate and Rhythm:  Normal rate and regular rhythm.  Pulmonary:     Effort: Pulmonary effort is normal. No respiratory distress.     Breath sounds: No wheezing, rhonchi or rales.  Abdominal:     General: Abdomen is flat.     Tenderness: There is abdominal tenderness (Generalized). There is no guarding.  Musculoskeletal:        General: Normal range of motion.     Cervical back: Neck supple.  Skin:    General: Skin is warm and dry.  Neurological:     Mental Status: She is alert and oriented to  person, place, and time.  Psychiatric:        Mood and Affect: Mood normal.        Behavior: Behavior normal.     ED Results / Procedures / Treatments   Labs (all labs ordered are listed, but only abnormal results are displayed) Labs Reviewed  LIPASE, BLOOD - Abnormal; Notable for the following components:      Result Value   Lipase <10 (*)    All other components within normal limits  COMPREHENSIVE METABOLIC PANEL - Abnormal; Notable for the following components:   CO2 19 (*)    Total Protein 6.2 (*)    AST 42 (*)    All other components within normal limits  URINALYSIS, ROUTINE W REFLEX MICROSCOPIC - Abnormal; Notable for the following components:   Hgb urine dipstick MODERATE (*)    Ketones, ur >80 (*)    Protein, ur TRACE (*)    All other components within normal limits  RESP PANEL BY RT-PCR (RSV, FLU A&B, COVID)  RVPGX2  CBC  MAGNESIUM    EKG None  Radiology CT ABDOMEN PELVIS W CONTRAST Result Date: 05/05/2023 CLINICAL DATA:  Acute abdominal pain. Nausea and vomiting. Diarrhea. EXAM: CT ABDOMEN AND PELVIS WITH CONTRAST TECHNIQUE: Multidetector CT imaging of the abdomen and pelvis was performed using the standard protocol following bolus administration of intravenous contrast. RADIATION DOSE REDUCTION: This exam was performed according to the departmental dose-optimization program which includes automated exposure control, adjustment of the mA and/or kV according to patient size and/or use of  iterative reconstruction technique. CONTRAST:  OMNIPAQUE IOHEXOL 300 MG/ML  SOLN COMPARISON:  11/01/2022 FINDINGS: Lower chest: Unremarkable Hepatobiliary: Cholecystectomy.  Otherwise unremarkable. Pancreas: Unremarkable Spleen: Unremarkable Adrenals/Urinary Tract: Left kidney upper pole hypodense exophytic lesion, internal density 22 Hounsfield units, homogeneous appearance, compatible with benign but mildly complex cyst. No further imaging workup of this lesion is indicated. Adrenal glands unremarkable.  Urinary bladder appears normal. Stomach/Bowel: Prior sleeve gastrectomy. No dilated bowel appendix not well seen, but no pericecal inflammatory process is observed. No specific imaging findings to corroborate with the patient's gastrointestinal symptoms. Vascular/Lymphatic: Unremarkable Reproductive: Uterus absent.  Adnexa unremarkable. Other: No supplemental non-categorized findings. Musculoskeletal: Unremarkable IMPRESSION: 1. No specific imaging findings to corroborate with the patient's gastrointestinal symptoms. 2. Prior sleeve gastrectomy, cholecystectomy, and hysterectomy. Electronically Signed   By: Gaylyn Rong M.D.   On: 05/05/2023 18:49    Procedures Procedures    Medications Ordered in ED Medications  sodium chloride 0.9 % bolus 1,000 mL (1,000 mLs Intravenous New Bag/Given 05/05/23 1514)  ondansetron (ZOFRAN) injection 4 mg (4 mg Intravenous Given 05/05/23 1510)  morphine (PF) 4 MG/ML injection 4 mg (4 mg Intravenous Given 05/05/23 1511)  metoCLOPramide (REGLAN) injection 10 mg (10 mg Intravenous Given 05/05/23 1729)  iohexol (OMNIPAQUE) 300 MG/ML solution 100 mL (100 mLs Intravenous Contrast Given 05/05/23 1740)    ED Course/ Medical Decision Making/ A&P                                 Medical Decision Making Amount and/or Complexity of Data Reviewed Labs: ordered. Radiology: ordered.  Risk Prescription drug management.  This patient presents to the ED for concern  of nausea, vomiting, diarrhea, this involves an extensive number of treatment options, and is a complaint that carries with it a high risk of complications and morbidity. The differential diagnosis of diarrhea includes but is not limited to  Viral- norovirus/rotavirus; Bacterial-Campylobacter,Shigella, Salmonella, Escherichia coli, E. coli 0157:H7, Yersinia enterocolitica, Vibrio cholerae, Clostridium difficile. Parasitic- Giardia lamblia, Cryptosporidium,Entamoeba histolytica,Cyclospora, Microsporidium. Toxin- Staphylococcus aureus, Bacillus cereus. Noninfectious causes include GI Bleed, Appendicitis, Mesenteric Ischemia, Diverticulitis, Adrenal Crisis, Thyroid Storm, Toxicologic exposures, Antibiotic or drug-associated, inflammatory bowel disease.   My initial workup includes labs, symptom control  Additional history obtained from: Nursing notes from this visit. Husband at bedside  I ordered, reviewed and interpreted labs which include: CBC, CMP, lipase, magnesium, urinalysis, respiratory panel, C. difficile quick screen.  No leukocytosis or anemia.  Lipase less than 10.  No electrolyte derangement or kidney dysfunction.  Normal LFTs.  Bicarb decreased to 19.  Urine with moderate hemoglobin, 880 ketones, without evidence of infection.  STI panel negative.  Magnesium negative.  I ordered imaging studies including CT abdomen pelvis I independently visualized and interpreted imaging which showed no acute intra-abdominal abnormalities I agree with the radiologist interpretation  Cardiac Monitoring:  The patient was maintained on a cardiac monitor.  I personally viewed and interpreted the cardiac monitored which showed an underlying rhythm of: NSR  Afebrile, hemodynamically stable.  68 year old female presenting to the ED for evaluation of nausea, vomiting, diarrhea, abdominal pain.  Symptoms began 5 days ago.  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  No melena,  hematochezia or hematemesis.  No fevers.  On exam, she does have a diffusely tender abdomen.  Lab workup fairly reassuring as stated above.  Ketonuria likely due to malnutrition.  Bicarb likely due to GI loss.  She did not have any episodes of vomiting or diarrhea while in the emergency department.  CT abdomen pelvis reassuring.  Patient reported improvement in his symptoms after treatment in the emergency department.  She was able to tolerate oral intake.  Suspect a viral illness.  Her symptoms diarrhea appears to be improving.  She was encouraged to follow-up with a primary care provider.  She was given a prescription for Reglan for her symptoms.  She was given return precautions.  Stable at discharge.  At this time there does not appear to be any evidence of an acute emergency medical condition and the patient appears stable for discharge with appropriate outpatient follow up. Diagnosis was discussed with patient who verbalizes understanding of care plan and is agreeable to discharge. I have discussed return precautions with patient and husband at bedside who verbalizes understanding. Patient encouraged to follow-up with their PCP within 1 week. All questions answered.  Note: Portions of this report may have been transcribed using voice recognition software. Every effort was made to ensure accuracy; however, inadvertent computerized transcription errors may still be present.        Final Clinical Impression(s) / ED Diagnoses Final diagnoses:  Nausea, vomiting and diarrhea    Rx / DC Orders ED Discharge Orders          Ordered    metoCLOPramide (REGLAN) 10 MG tablet  Every 8 hours PRN        05/05/23 1856              Michelle Piper, PA-C 05/05/23 1909    Melene Plan, DO 05/06/23 [phone]

## 2023-05-05 NOTE — ED Triage Notes (Signed)
 Pt has been sick with nausea, vomiting and diarrhea since Tuesday.  Lots of sick contacts at work

## 2023-05-05 NOTE — ED Notes (Signed)
Unable to collect stool at this time.

## 2023-05-05 NOTE — Discharge Instructions (Addendum)
 You have been seen today for your complaint of nausea, vomiting and diarrhea. Your lab work was reassuring but showed some dehydration. Your imaging was reassuring. Your discharge medications include Reglan.  This is a nausea medicine.  Take as needed in order to eat and drink normal diet. Home care instructions are as follows:  Follow an easy to digest diet such as bananas, rice, applesauce and toast Follow up with: Your primary care doctor Please seek immediate medical care if you develop any of the following symptoms: Have chest pain. Have trouble breathing or you are breathing very quickly. Have a fast heartbeat. Feel extremely weak or you faint. Have a severe headache, a stiff neck, or both. Have a rash. Have severe pain, cramping, or bloating in your abdomen. Have skin that feels cold and clammy. Feel confused. Have pain when you urinate. Have signs of dehydration, such as: Dark urine, very little urine, or no urine. Cracked lips. Dry mouth. Sunken eyes. Sleepiness. Weakness. Have signs of bleeding, such as: Seeing blood in your vomit. Having vomit that looks like coffee grounds. Having bloody or black stools or stools that look like tar. At this time there does not appear to be the presence of an emergent medical condition, however there is always the potential for conditions to change. Please read and follow the below instructions.  Do not take your medicine if  develop an itchy rash, swelling in your mouth or lips, or difficulty breathing; call 911 and seek immediate emergency medical attention if this occurs.  You may review your lab tests and imaging results in their entirety on your MyChart account.  Please discuss all results of fully with your primary care provider and other specialist at your follow-up visit.  Note: Portions of this text may have been transcribed using voice recognition software. Every effort was made to ensure accuracy; however, inadvertent  computerized transcription errors may still be present.

## 2023-05-05 NOTE — ED Notes (Signed)
 Transport to ct

## 2023-05-06 ENCOUNTER — Telehealth: Payer: Self-pay

## 2023-05-06 NOTE — Transitions of Care (Post Inpatient/ED Visit) (Signed)
   05/06/2023  Name: [name] MRN: [id number] DOB: 09-30-1955  Today's TOC FU Call Status: Today's TOC FU Call Status:: Unsuccessful Call (1st Attempt) Unsuccessful Call (1st Attempt) Date: 05/06/23  Attempted to reach the patient regarding the most recent Inpatient/ED visit.  Follow Up Plan: Additional outreach attempts will be made to reach the patient to complete the Transitions of Care (Post Inpatient/ED visit) call.   Signature Karena Addison, LPN [facility] Nurse Health Advisor Direct Dial [phone]

## 2023-05-10 NOTE — Transitions of Care (Post Inpatient/ED Visit) (Signed)
 05/10/2023  Name: [name] MRN: [id number] DOB: [birth year]  Today's TOC FU Call Status: Today's TOC FU Call Status:: Successful TOC FU Call Completed Unsuccessful Call (1st Attempt) Date: 05/06/23 [facility] FU Call Complete Date: 05/10/23 Patient's Name and Date of Birth confirmed.  Transition Care Management Follow-up Telephone Call Date of Discharge: 05/05/23 Discharge Facility: [facility] ([facility]) Type of Discharge: Emergency Department Reason for ED Visit: Other: (diarrhea) How have you been since you were released from the hospital?: Better Any questions or concerns?: No  Items Reviewed: Any new allergies since your discharge?: No Dietary orders reviewed?: Yes Do you have support at home?: Yes People in Home: spouse  Medications Reviewed Today: Medications Reviewed Today     Reviewed by Karena Addison, LPN (Licensed Practical Nurse) on 05/10/23 at 1716  Med List Status: <None>   Medication Order Taking? Sig Documenting Provider Last Dose Status Informant  abatacept (ORENCIA) 250 MG injection [id number] No Inject 750 mg into the vein every 30 (thirty) days. [provider] Taking Active Self  amLODipine (NORVASC) 5 MG tablet [id number] No Take 1 tablet (5 mg total) by mouth daily. Kristian Covey, MD Taking Active   B-D TB SYRINGE 1CC/27GX1/2" 27G X 1/2" 1 ML MISC [id number] No  [provider] Taking Active Self  Biotin 10 MG CAPS [id number] No Take 10 mg by mouth daily. [provider] Taking Active Self  cyclobenzaprine (FLEXERIL) 10 MG tablet [id number] No Take 1 tablet (10 mg total) by mouth 3 (three) times daily as needed for muscle spasms. Shuford, French Ana, PA-C Taking Active   doxycycline (VIBRA-TABS) 100 MG tablet [id number]  Take 1 tablet (100 mg total) by mouth 2 (two) times daily. Kristian Covey, MD  Active   doxycycline (VIBRAMYCIN) 100 MG capsule [id number]  Take 1 capsule (100 mg total) by mouth 2 (two) times daily.  Kristian Covey, MD  Active   EPINEPHrine (EPIPEN 2-PAK) 0.3 mg/0.3 mL IJ SOAJ injection [id number] No Inject 0.3 mLs (0.3 mg total) into the muscle once. Kristian Covey, MD Taking Active Self  famotidine (PEPCID) 20 MG tablet [id number] No Take 1 tablet (20 mg total) by mouth 2 (two) times daily. Peter Garter, PA Taking Active   fluticasone (FLONASE) 50 MCG/ACT nasal spray [id number] No Place 2 sprays into both nostrils daily. Cathlyn Parsons, NP Taking Active   folic acid (FOLVITE) 800 MCG tablet [id number] No Take 1,600 mcg by mouth daily. [provider] Taking Active Self  lisinopril (ZESTRIL) 10 MG tablet [id number] No TAKE ONE TABLET DAILY Burchette, Elberta Fortis, MD Taking Active   Methotrexate 25 MG/ML SOSY [id number] No Inject 25 mg into the skin every 7 (seven) days. [provider] Taking Active Self  methotrexate 50 MG/2ML injection [id number]  Inject 4 mLs into the muscle once a week. [provider]  Active   metoCLOPramide (REGLAN) 10 MG tablet [id number]  Take 1 tablet (10 mg total) by mouth every 8 (eight) hours as needed for nausea. Schutt, Alexander M, PA-C  Active   ondansetron (ZOFRAN-ODT) 4 MG disintegrating tablet [id number] No Take 1 tablet (4 mg total) by mouth every 8 (eight) hours as needed. Peter Garter, [place] Taking Active   oxyCODONE (OXY IR/ROXICODONE) 5 MG immediate release tablet [id number] No Take 1 tablet (5 mg total) by mouth every 4 (four) hours as needed for pain. Stop Percocet due to elevated liver enzymes.  Taking Active   oxyCODONE (OXY IR/ROXICODONE) 5 MG immediate release tablet [id number]  No Take 1 tablet (5 mg total) by mouth every 4 (four) hours as needed for pain.  Taking Active   oxyCODONE (OXY IR/ROXICODONE) 5 MG immediate release tablet [id number] No Take 1 tablet by mouth every four hours as needed for pain. stop percocet due to elevated liver enzymes  Taking Active   oxyCODONE (OXY IR/ROXICODONE) 5 MG immediate release tablet  [id number] No Take 1 tablet by mouth every four hours as needed for pain. stop percocet due to elevated liver enzymes  Taking Active   oxyCODONE (OXY IR/ROXICODONE) 5 MG immediate release tablet [id number]  Take 1 tablet (5 mg total) by mouth every 4 (four) hours as needed for pain. Stop percocet due to elevated liver enzymes.   Active   oxyCODONE (OXY IR/ROXICODONE) 5 MG immediate release tablet [id number]  Take 1 tablet (5 mg total) by mouth every 4 (four) hours as needed for pain. Stop percocet due to elevated liver enzymes.   Active   polyethylene glycol (MIRALAX / GLYCOLAX) 17 g packet [id number] No Take 17 g by mouth daily. [provider] Taking Active Self  predniSONE (DELTASONE) 10 MG tablet [id number] No Take 10 mg by mouth daily with breakfast. [provider] Taking Active Self  rosuvastatin (CRESTOR) 20 MG tablet [id number]  Take 1 tablet (20 mg total) by mouth daily. Pt needs to call and schedule office visit for further refills - 2nd attempt Jake Bathe, MD  Active   sertraline (ZOLOFT) 100 MG tablet [id number] No TAKE ONE TABLET DAILY Burchette, Elberta Fortis, MD Taking Active   Tavaborole (KERYDIN) 5 % SOLN [id number] No Apply 1 drop topically daily. Apply 1 drop to the toenail daily. Vivi Barrack, DPM Taking Active   zolpidem (AMBIEN) 10 MG tablet [id number] No TAKE ONE TABLET AT BEDTIME AS NEEDED FOR SLEEP Burchette, Elberta Fortis, MD Taking Active   Med List Note Milana Obey 03/23/15 1859): [facility] : 1 [phone]. Call about strength of pramipexole last taken 01.27.17            Home Care and Equipment/Supplies: Were Home Health Services Ordered?: NA Any new equipment or medical supplies ordered?: NA  Functional Questionnaire: Do you need assistance with bathing/showering or dressing?: No Do you need assistance with meal preparation?: No Do you need assistance with eating?: No Do you have difficulty maintaining continence: No Do you need assistance  with getting out of bed/getting out of a chair/moving?: No Do you have difficulty managing or taking your medications?: No  Follow up appointments reviewed: PCP Follow-up appointment confirmed?: No (declined) MD Provider Line [phone] Given: No Specialist Hospital Follow-up appointment confirmed?: NA Do you need transportation to your follow-up appointment?: No Do you understand care options if your condition(s) worsen?: Yes-patient verbalized understanding    SIGNATURE Karena Addison, LPN [facility] Nurse Health Advisor Direct Dial [phone]

## 2023-05-16 ENCOUNTER — Other Ambulatory Visit (HOSPITAL_COMMUNITY): Payer: Self-pay

## 2023-05-17 ENCOUNTER — Other Ambulatory Visit (HOSPITAL_COMMUNITY): Payer: Self-pay

## 2023-05-17 DIAGNOSIS — C44519 Basal cell carcinoma of skin of other part of trunk: Secondary | ICD-10-CM | POA: Diagnosis not present

## 2023-05-17 DIAGNOSIS — D485 Neoplasm of uncertain behavior of skin: Secondary | ICD-10-CM | POA: Diagnosis not present

## 2023-05-17 DIAGNOSIS — B078 Other viral warts: Secondary | ICD-10-CM | POA: Diagnosis not present

## 2023-05-17 DIAGNOSIS — C44319 Basal cell carcinoma of skin of other parts of face: Secondary | ICD-10-CM | POA: Diagnosis not present

## 2023-05-17 DIAGNOSIS — Z85828 Personal history of other malignant neoplasm of skin: Secondary | ICD-10-CM | POA: Diagnosis not present

## 2023-05-17 DIAGNOSIS — C4441 Basal cell carcinoma of skin of scalp and neck: Secondary | ICD-10-CM | POA: Diagnosis not present

## 2023-05-17 DIAGNOSIS — L570 Actinic keratosis: Secondary | ICD-10-CM | POA: Diagnosis not present

## 2023-05-17 DIAGNOSIS — D225 Melanocytic nevi of trunk: Secondary | ICD-10-CM | POA: Diagnosis not present

## 2023-05-17 DIAGNOSIS — D2261 Melanocytic nevi of right upper limb, including shoulder: Secondary | ICD-10-CM | POA: Diagnosis not present

## 2023-05-26 DIAGNOSIS — M0589 Other rheumatoid arthritis with rheumatoid factor of multiple sites: Secondary | ICD-10-CM | POA: Diagnosis not present

## 2023-05-26 DIAGNOSIS — Z79899 Other long term (current) drug therapy: Secondary | ICD-10-CM | POA: Diagnosis not present

## 2023-05-31 ENCOUNTER — Other Ambulatory Visit: Payer: Self-pay | Admitting: Family Medicine

## 2023-06-06 ENCOUNTER — Encounter: Payer: Self-pay | Admitting: Family Medicine

## 2023-06-06 ENCOUNTER — Other Ambulatory Visit: Payer: Self-pay | Admitting: Family Medicine

## 2023-06-06 DIAGNOSIS — Z1231 Encounter for screening mammogram for malignant neoplasm of breast: Secondary | ICD-10-CM

## 2023-06-07 DIAGNOSIS — C44319 Basal cell carcinoma of skin of other parts of face: Secondary | ICD-10-CM | POA: Diagnosis not present

## 2023-06-07 DIAGNOSIS — Z85828 Personal history of other malignant neoplasm of skin: Secondary | ICD-10-CM | POA: Diagnosis not present

## 2023-06-14 ENCOUNTER — Other Ambulatory Visit (HOSPITAL_COMMUNITY): Payer: Self-pay

## 2023-06-14 DIAGNOSIS — G894 Chronic pain syndrome: Secondary | ICD-10-CM | POA: Diagnosis not present

## 2023-06-14 DIAGNOSIS — K5903 Drug induced constipation: Secondary | ICD-10-CM | POA: Diagnosis not present

## 2023-06-14 DIAGNOSIS — M47812 Spondylosis without myelopathy or radiculopathy, cervical region: Secondary | ICD-10-CM | POA: Diagnosis not present

## 2023-06-14 DIAGNOSIS — G5603 Carpal tunnel syndrome, bilateral upper limbs: Secondary | ICD-10-CM | POA: Diagnosis not present

## 2023-06-14 MED ORDER — OXYCODONE HCL 5 MG PO TABS
5.0000 mg | ORAL_TABLET | ORAL | 0 refills | Status: DC | PRN
Start: 2023-06-15 — End: 2023-08-19
  Filled 2023-06-17: qty 180, 30d supply, fill #0

## 2023-06-14 MED ORDER — OXYCODONE HCL 5 MG PO TABS
5.0000 mg | ORAL_TABLET | ORAL | 0 refills | Status: DC | PRN
  Filled 2023-07-19: qty 180, 30d supply, fill #0

## 2023-06-15 ENCOUNTER — Telehealth: Payer: Self-pay | Admitting: Cardiology

## 2023-06-15 ENCOUNTER — Other Ambulatory Visit (HOSPITAL_COMMUNITY): Payer: Self-pay

## 2023-06-15 ENCOUNTER — Ambulatory Visit
Admission: RE | Admit: 2023-06-15 | Discharge: 2023-06-15 | Disposition: A | Discharge status: Home / Self Care | Source: Ambulatory Visit

## 2023-06-15 DIAGNOSIS — Z1231 Encounter for screening mammogram for malignant neoplasm of breast: Secondary | ICD-10-CM | POA: Diagnosis not present

## 2023-06-15 MED ORDER — ROSUVASTATIN CALCIUM 20 MG PO TABS
20.0000 mg | ORAL_TABLET | Freq: Every day | ORAL | 0 refills | Status: DC
Start: 2023-06-15 — End: 2023-09-22

## 2023-06-15 NOTE — Telephone Encounter (Signed)
*  STAT* If patient is at the pharmacy, call can be transferred to refill team.   1. Which medications need to be refilled? (please list name of each medication and dose if known)   rosuvastatin  (CRESTOR ) 20 MG tablet    2. Which pharmacy/location (including street and city if local pharmacy) is medication to be sent to? [facility]: [address] [place], [place] - [address] Phone: [phone]  Fax: [phone]     3. Do they need a 30 day or 90 day supply? Pt has made first avail appt on 6/27 please refill til then. Pt is out of medication

## 2023-06-15 NOTE — Telephone Encounter (Signed)
 RX sent to requested Pharmacy

## 2023-06-17 ENCOUNTER — Other Ambulatory Visit (HOSPITAL_COMMUNITY): Payer: Self-pay

## 2023-06-21 ENCOUNTER — Other Ambulatory Visit (HOSPITAL_COMMUNITY): Payer: Self-pay | Admitting: Gastroenterology

## 2023-06-21 DIAGNOSIS — R1033 Periumbilical pain: Secondary | ICD-10-CM | POA: Diagnosis not present

## 2023-06-21 DIAGNOSIS — Z1211 Encounter for screening for malignant neoplasm of colon: Secondary | ICD-10-CM | POA: Diagnosis not present

## 2023-06-21 DIAGNOSIS — Z800 Family history of malignant neoplasm of digestive organs: Secondary | ICD-10-CM | POA: Diagnosis not present

## 2023-06-21 DIAGNOSIS — K219 Gastro-esophageal reflux disease without esophagitis: Secondary | ICD-10-CM | POA: Diagnosis not present

## 2023-06-21 DIAGNOSIS — K5904 Chronic idiopathic constipation: Secondary | ICD-10-CM | POA: Diagnosis not present

## 2023-06-21 DIAGNOSIS — R1084 Generalized abdominal pain: Secondary | ICD-10-CM

## 2023-06-23 DIAGNOSIS — M0589 Other rheumatoid arthritis with rheumatoid factor of multiple sites: Secondary | ICD-10-CM | POA: Diagnosis not present

## 2023-06-29 ENCOUNTER — Ambulatory Visit (HOSPITAL_BASED_OUTPATIENT_CLINIC_OR_DEPARTMENT_OTHER)

## 2023-07-02 ENCOUNTER — Ambulatory Visit (HOSPITAL_BASED_OUTPATIENT_CLINIC_OR_DEPARTMENT_OTHER)
Admission: RE | Admit: 2023-07-02 | Discharge: 2023-07-02 | Disposition: A | Discharge status: Home / Self Care | Source: Ambulatory Visit | Attending: Gastroenterology | Admitting: Gastroenterology

## 2023-07-02 DIAGNOSIS — R1084 Generalized abdominal pain: Secondary | ICD-10-CM | POA: Diagnosis not present

## 2023-07-02 MED ORDER — IOHEXOL 300 MG/ML  SOLN
100.0000 mL | Freq: Once | INTRAMUSCULAR | Status: AC | PRN
  Administered 2023-07-02: 100 mL via INTRAVENOUS

## 2023-07-19 ENCOUNTER — Other Ambulatory Visit (HOSPITAL_COMMUNITY): Payer: Self-pay

## 2023-07-21 DIAGNOSIS — M0589 Other rheumatoid arthritis with rheumatoid factor of multiple sites: Secondary | ICD-10-CM | POA: Diagnosis not present

## 2023-08-01 ENCOUNTER — Other Ambulatory Visit: Payer: Self-pay | Admitting: Family Medicine

## 2023-08-09 ENCOUNTER — Other Ambulatory Visit (HOSPITAL_COMMUNITY): Payer: Self-pay

## 2023-08-09 DIAGNOSIS — M47812 Spondylosis without myelopathy or radiculopathy, cervical region: Secondary | ICD-10-CM | POA: Diagnosis not present

## 2023-08-09 DIAGNOSIS — G894 Chronic pain syndrome: Secondary | ICD-10-CM | POA: Diagnosis not present

## 2023-08-09 DIAGNOSIS — G5603 Carpal tunnel syndrome, bilateral upper limbs: Secondary | ICD-10-CM | POA: Diagnosis not present

## 2023-08-09 DIAGNOSIS — K5903 Drug induced constipation: Secondary | ICD-10-CM | POA: Diagnosis not present

## 2023-08-09 MED ORDER — OXYCODONE HCL 5 MG PO TABS
5.0000 mg | ORAL_TABLET | ORAL | 0 refills | Status: DC | PRN
  Filled 2023-08-18: qty 180, 30d supply, fill #0

## 2023-08-09 MED ORDER — OXYCODONE HCL 5 MG PO TABS
5.0000 mg | ORAL_TABLET | ORAL | 0 refills | Status: AC | PRN
  Filled 2023-09-21: qty 180, 30d supply, fill #0

## 2023-08-17 ENCOUNTER — Other Ambulatory Visit (HOSPITAL_COMMUNITY): Payer: Self-pay

## 2023-08-18 ENCOUNTER — Other Ambulatory Visit (HOSPITAL_COMMUNITY): Payer: Self-pay

## 2023-08-18 DIAGNOSIS — M0589 Other rheumatoid arthritis with rheumatoid factor of multiple sites: Secondary | ICD-10-CM | POA: Diagnosis not present

## 2023-08-18 DIAGNOSIS — Z79899 Other long term (current) drug therapy: Secondary | ICD-10-CM | POA: Diagnosis not present

## 2023-08-18 NOTE — Progress Notes (Signed)
 Cardiology Office Note    Patient Name: [name] Date of Encounter: 08/18/2023  Primary Care Provider:  Micheal Wolm ORN, MD Primary Cardiologist:  Oneil Parchment, MD Primary Electrophysiologist: None   Past Medical History    Past Medical History:  Diagnosis Date   ANGIOMA 10/03/2008   REMOVED FROM RIGHT LOWER LEG-BENIGN   Anxiety    Aortic atherosclerosis (HCC)    Arthritis    RA   Bronchitis    hx of    Cancer (HCC)    BASAL CELL SKIN CANCER   Complication of anesthesia    Coronary artery disease    COVID-19 06/18/2020   HA, chills, myalgias, congestion & fever, all symptoms resolved as of 12/29/20 per patient   Depression    Fibromyalgia    GERD 08/27/2008   History of blood transfusion    1995   History of hiatal hernia    hx of has had surgically repaired   History of measles    History of mumps    History of nonmelanoma skin cancer    HYPERLIPIDEMIA 08/27/2008   HYPERTENSION 08/27/2008   pt is currently on no meds    Pneumonia    hx of    PONV (postoperative nausea and vomiting)    Rheumatoid arthritis (HCC)    SEBORRHEIC KERATOSIS 08/27/2008   Sinus headache    Vocal cord paresis    hx of    Wears glasses    Wears partial dentures     History of Present Illness  [name] is a 68 y.o. female with a PMH of palpitations, HLD, aortic atherosclerosis who presents today for follow-up.  [name] was seen initially by Dr. Parchment in 2021 for complaint of atypical chest pain.  She completed a coronary CTA that showed calcium  score of 71 with focal calcified proximal LAD nonflow limiting with aortic atherosclerosis and mild nonobstructive disease.  2D echo was also completed showing normal heart pump function with no significant valve abnormalities present.  For follow-up in 2022 and reported palpitations and wore an event monitor that showed sinus rhythm with rare PVCs and PACs and no evidence of atrial fibrillation.  She was continued on metoprolol   for brief episodes of atrial tachycardia.  She was last seen by Dr. Parchment on 09/28/2021 and endorsed ongoing palpitations with low threshold for ZIO monitor.  She was advised to increase her magnesium  and potassium and patient denied any angina or chest pain.  [name] presents today for biannual follow-up. n 2022, she experienced palpitations and wore an event monitor, which did not show sustained arrhythmia or atrial fibrillation, but did show a brief episode of atrial tachycardia. She continues to experience palpitations occasionally, though not frequently.  She is currently on metoprolol , Norvasc , and Crestor . Her cholesterol was last checked in 2024, showing stable levels. No issues with her current medications. Her kidney function and electrolytes, including magnesium  and potassium, were checked in March and were normal.  She works full-time in a nursing home, which keeps her physically active, although she does not engage in additional physical activities outside of work unless necessary. She has a family history of heart attacks in her sisters and mother before the age of 9.  Patient denies chest pain, palpitations, dyspnea, PND, orthopnea, nausea, vomiting, dizziness, syncope, edema, weight gain, or early satiety.   Discussed the use of AI scribe software for clinical note transcription with the patient, who gave verbal consent to proceed.  History of Present  Illness   Review of Systems  Please see the history of present illness.    All other systems reviewed and are otherwise negative except as noted above.  Physical Exam    Wt Readings from Last 3 Encounters:  04/11/23 212 lb (96.2 kg)  11/30/22 214 lb 12.8 oz (97.4 kg)  10/27/22 216 lb 6.4 oz (98.2 kg)   CD:Uyzmz were no vitals filed for this visit.,There is no height or weight on file to calculate BMI. GEN: Well nourished, well developed in no acute distress Neck: No JVD; No carotid bruits Pulmonary: Clear to auscultation  without rales, wheezing or rhonchi  Cardiovascular: Normal rate. Regular rhythm. Normal S1. Normal S2.   Murmurs: There is no murmur.  ABDOMEN: Soft, non-tender, non-distended EXTREMITIES:  No edema; No deformity   EKG/LABS/ Recent Cardiac Studies   ECG personally reviewed by me today -none completed today  Risk Assessment/Calculations:          Lab Results  Component Value Date   WBC 8.4 05/05/2023   HGB 13.7 05/05/2023   HCT 42.2 05/05/2023   MCV 88.7 05/05/2023   PLT 287 05/05/2023   Lab Results  Component Value Date   CREATININE 0.49 05/05/2023   BUN 21 05/05/2023   NA 137 05/05/2023   K 4.0 05/05/2023   CL 104 05/05/2023   CO2 19 (L) 05/05/2023   Lab Results  Component Value Date   CHOL 154 12/16/2022   HDL 77.70 12/16/2022   LDLCALC 57 12/16/2022   TRIG 98.0 12/16/2022   CHOLHDL 2 12/16/2022    No results found for: HGBA1C Assessment & Plan    Assessment and Plan Assessment & Plan Palpitations Intermittent palpitations with no sustained arrhythmia. EKG shows occasional premature atrial contractions. - Continue metoprolol  as prescribed. - Encourage increased intake of magnesium  and potassium. - Advise regular monitoring of symptoms and report any changes.  Atrial tachycardia Previous event monitor showed brief atrial tachycardia. Current EKG shows occasional premature atrial contractions. Condition well-managed. - Continue metoprolol  as prescribed. - Encourage increased intake of magnesium  and potassium. - Advise regular monitoring of symptoms and report any changes.  Hypertension Blood pressure well-controlled with Norvasc . - Continue Norvasc  as prescribed. - Monitor blood pressure regularly.  Hyperlipidemia Cholesterol levels well-controlled with Crestor . - Continue Crestor  as prescribed. - Monitor cholesterol levels regularly.  Family history of premature coronary artery disease Family history of early heart attacks. Emphasized preventive  measures. - Encourage regular physical activity. - Monitor cardiovascular health regularly.  1.  Coronary artery disease: - Patient had previous coronary CTA completed showing no flow-limiting lesions. -Family history of early heart attacks. Emphasized preventive measures. - Encourage regular physical activity. - Monitor cardiovascular health regularly.  2.  Pure hypercholesteremia: -Cholesterol levels well-controlled with Crestor . - Continue Crestor  as prescribed. - Monitor cholesterol levels regularly.  3.  Palpitations -Intermittent palpitations with no sustained arrhythmia. EKG shows occasional premature atrial contractions. - Continue metoprolol  as prescribed. - Encourage increased intake of magnesium  and potassium. - Advise regular monitoring of symptoms and report any changes  4.  Aortic atherosclerosis - Cholesterol levels well-controlled -Continue Crestor  20 mg daily     Disposition: Follow-up with Oneil Parchment, MD or APP in 24 months   Signed, Wyn Raddle, Jackee Shove, NP 08/18/2023, 6:58 PM [facility]

## 2023-08-19 ENCOUNTER — Ambulatory Visit: Attending: Nurse Practitioner | Admitting: Nurse Practitioner

## 2023-08-19 ENCOUNTER — Encounter: Payer: Self-pay | Admitting: Nurse Practitioner

## 2023-08-19 VITALS — BP 128/80 | HR 59 | Ht 64.0 in | Wt 214.2 lb

## 2023-08-19 DIAGNOSIS — I2510 Atherosclerotic heart disease of native coronary artery without angina pectoris: Secondary | ICD-10-CM | POA: Diagnosis not present

## 2023-08-19 DIAGNOSIS — I700 Atherosclerosis of aorta: Secondary | ICD-10-CM | POA: Insufficient documentation

## 2023-08-19 DIAGNOSIS — R002 Palpitations: Secondary | ICD-10-CM | POA: Insufficient documentation

## 2023-08-19 DIAGNOSIS — E7800 Pure hypercholesterolemia, unspecified: Secondary | ICD-10-CM | POA: Diagnosis not present

## 2023-08-19 NOTE — Patient Instructions (Addendum)
 Medication Instructions:  Your physician recommends that you continue on your current medications as directed. Please refer to the Current Medication list given to you today.  *If you need a refill on your cardiac medications before your next appointment, please call your pharmacy*  Lab Work: None ordered  If you have labs (blood work) drawn today and your tests are completely normal, you will receive your results only by: MyChart Message (if you have MyChart) OR A paper copy in the mail If you have any lab test that is abnormal or we need to change your treatment, we will call you to review the results.  Testing/Procedures: None ordered  Follow-Up: At [facility], you and your health needs are our priority.  As part of our continuing mission to provide you with exceptional heart care, our providers are all part of one team.  This team includes your primary Cardiologist (physician) and Advanced Practice Providers or APPs (Physician Assistants and Nurse Practitioners) who all work together to provide you with the care you need, when you need it.  Your next appointment:   2 Years  Provider:   Oneil Parchment, MD    We recommend signing up for the patient portal called MyChart.  Sign up information is provided on this After Visit Summary.  MyChart is used to connect with patients for Virtual Visits (Telemedicine).  Patients are able to view lab/test results, encounter notes, upcoming appointments, etc.  Non-urgent messages can be sent to your provider as well.   To learn more about what you can do with MyChart, go to [email or web address].   Other Instructions

## 2023-09-05 ENCOUNTER — Other Ambulatory Visit: Payer: Self-pay | Admitting: Family Medicine

## 2023-09-14 ENCOUNTER — Observation Stay (HOSPITAL_COMMUNITY)
Admission: EM | Admit: 2023-09-14 | Discharge: 2023-09-14 | Disposition: A | Discharge status: Home / Self Care | Attending: Cardiology | Admitting: Cardiology

## 2023-09-14 ENCOUNTER — Emergency Department (HOSPITAL_COMMUNITY)

## 2023-09-14 ENCOUNTER — Encounter (HOSPITAL_COMMUNITY): Admission: EM | Disposition: A | Discharge status: Home / Self Care | Payer: Self-pay | Source: Home / Self Care

## 2023-09-14 DIAGNOSIS — M797 Fibromyalgia: Secondary | ICD-10-CM | POA: Insufficient documentation

## 2023-09-14 DIAGNOSIS — E782 Mixed hyperlipidemia: Secondary | ICD-10-CM | POA: Insufficient documentation

## 2023-09-14 DIAGNOSIS — I10 Essential (primary) hypertension: Secondary | ICD-10-CM | POA: Diagnosis not present

## 2023-09-14 DIAGNOSIS — Z96653 Presence of artificial knee joint, bilateral: Secondary | ICD-10-CM | POA: Insufficient documentation

## 2023-09-14 DIAGNOSIS — Z79899 Other long term (current) drug therapy: Secondary | ICD-10-CM | POA: Diagnosis not present

## 2023-09-14 DIAGNOSIS — I700 Atherosclerosis of aorta: Secondary | ICD-10-CM | POA: Diagnosis not present

## 2023-09-14 DIAGNOSIS — Z96612 Presence of left artificial shoulder joint: Secondary | ICD-10-CM | POA: Diagnosis not present

## 2023-09-14 DIAGNOSIS — G4700 Insomnia, unspecified: Secondary | ICD-10-CM | POA: Insufficient documentation

## 2023-09-14 DIAGNOSIS — E7800 Pure hypercholesterolemia, unspecified: Secondary | ICD-10-CM | POA: Diagnosis not present

## 2023-09-14 DIAGNOSIS — Z85828 Personal history of other malignant neoplasm of skin: Secondary | ICD-10-CM | POA: Insufficient documentation

## 2023-09-14 DIAGNOSIS — M069 Rheumatoid arthritis, unspecified: Secondary | ICD-10-CM | POA: Insufficient documentation

## 2023-09-14 DIAGNOSIS — Z8739 Personal history of other diseases of the musculoskeletal system and connective tissue: Secondary | ICD-10-CM

## 2023-09-14 DIAGNOSIS — I200 Unstable angina: Secondary | ICD-10-CM | POA: Diagnosis not present

## 2023-09-14 DIAGNOSIS — Z8616 Personal history of COVID-19: Secondary | ICD-10-CM | POA: Insufficient documentation

## 2023-09-14 DIAGNOSIS — R079 Chest pain, unspecified: Secondary | ICD-10-CM

## 2023-09-14 DIAGNOSIS — I25110 Atherosclerotic heart disease of native coronary artery with unstable angina pectoris: Principal | ICD-10-CM | POA: Insufficient documentation

## 2023-09-14 DIAGNOSIS — F32A Depression, unspecified: Secondary | ICD-10-CM | POA: Insufficient documentation

## 2023-09-14 HISTORY — PX: LEFT HEART CATH AND CORONARY ANGIOGRAPHY: CATH118249

## 2023-09-14 LAB — CBC
HCT: 39.5 % (ref 36.0–46.0)
Hemoglobin: 12.3 g/dL (ref 12.0–15.0)
MCH: 29 pg (ref 26.0–34.0)
MCHC: 31.1 g/dL (ref 30.0–36.0)
MCV: 93.2 fL (ref 80.0–100.0)
Platelets: 272 K/uL (ref 150–400)
RBC: 4.24 MIL/uL (ref 3.87–5.11)
RDW: 14.3 % (ref 11.5–15.5)
WBC: 6.5 K/uL (ref 4.0–10.5)
nRBC: 0 % (ref 0.0–0.2)

## 2023-09-14 LAB — BASIC METABOLIC PANEL WITH GFR
Anion gap: 12 (ref 5–15)
BUN: 22 mg/dL (ref 8–23)
CO2: 25 mmol/L (ref 22–32)
Calcium: 9.6 mg/dL (ref 8.9–10.3)
Chloride: 103 mmol/L (ref 98–111)
Creatinine, Ser: 0.64 mg/dL (ref 0.44–1.00)
GFR, Estimated: >60 mL/min (ref >60)
Glucose, Bld: 100 mg/dL — ABNORMAL HIGH (ref 70–99)
Potassium: 3.9 mmol/L (ref 3.5–5.1)
Sodium: 140 mmol/L (ref 135–145)

## 2023-09-14 LAB — HEMOGLOBIN A1C
Hgb A1c MFr Bld: 5.6 % (ref 4.8–5.6)
Mean Plasma Glucose: 114.02 mg/dL

## 2023-09-14 LAB — TROPONIN I (HIGH SENSITIVITY)
Troponin I (High Sensitivity): 2 ng/L (ref <18)
Troponin I (High Sensitivity): <2 ng/L (ref <18)

## 2023-09-14 SURGERY — LEFT HEART CATH AND CORONARY ANGIOGRAPHY
Anesthesia: LOCAL

## 2023-09-14 MED ORDER — LIDOCAINE HCL (PF) 1 % IJ SOLN
INTRAMUSCULAR | Status: DC | PRN
  Administered 2023-09-14: 2 mL

## 2023-09-14 MED ORDER — SODIUM CHLORIDE 0.9 % WEIGHT BASED INFUSION
3.0000 mL/kg/h | INTRAVENOUS | Status: DC
  Administered 2023-09-14: 3 mL/kg/h via INTRAVENOUS

## 2023-09-14 MED ORDER — LIDOCAINE HCL (PF) 1 % IJ SOLN
INTRAMUSCULAR | Status: AC
  Filled 2023-09-14: qty 30

## 2023-09-14 MED ORDER — HEPARIN (PORCINE) IN NACL 1000-0.9 UT/500ML-% IV SOLN
INTRAVENOUS | Status: DC | PRN
  Administered 2023-09-14 (×3): 500 mL

## 2023-09-14 MED ORDER — SODIUM CHLORIDE 0.9% FLUSH: 3.0000 mL | Freq: Two times a day (BID) | INTRAVENOUS | Status: DC

## 2023-09-14 MED ORDER — MIDAZOLAM HCL 2 MG/2ML IJ SOLN
INTRAMUSCULAR | Status: AC
  Filled 2023-09-14: qty 2

## 2023-09-14 MED ORDER — FENTANYL CITRATE (PF) 100 MCG/2ML IJ SOLN
INTRAMUSCULAR | Status: AC
  Filled 2023-09-14: qty 2

## 2023-09-14 MED ORDER — SODIUM CHLORIDE 0.9 % WEIGHT BASED INFUSION: 1.0000 mL/kg/h | INTRAVENOUS | Status: DC

## 2023-09-14 MED ORDER — ACETAMINOPHEN 325 MG PO TABS: 650.0000 mg | ORAL_TABLET | ORAL | Status: DC | PRN

## 2023-09-14 MED ORDER — SODIUM CHLORIDE 0.9% FLUSH: 3.0000 mL | INTRAVENOUS | Status: DC | PRN

## 2023-09-14 MED ORDER — NITROGLYCERIN 0.4 MG SL SUBL
0.4000 mg | SUBLINGUAL_TABLET | SUBLINGUAL | Status: DC | PRN
  Administered 2023-09-14 (×3): 0.4 mg via SUBLINGUAL
  Filled 2023-09-14 (×3): qty 1

## 2023-09-14 MED ORDER — FENTANYL CITRATE (PF) 100 MCG/2ML IJ SOLN
INTRAMUSCULAR | Status: DC | PRN
  Administered 2023-09-14 (×2): 25 ug via INTRAVENOUS

## 2023-09-14 MED ORDER — VERAPAMIL HCL 2.5 MG/ML IV SOLN
INTRAVENOUS | Status: AC
  Filled 2023-09-14: qty 2

## 2023-09-14 MED ORDER — ROSUVASTATIN CALCIUM 20 MG PO TABS: 20.0000 mg | ORAL_TABLET | Freq: Every day | ORAL | Status: DC

## 2023-09-14 MED ORDER — VERAPAMIL HCL 2.5 MG/ML IV SOLN
INTRAVENOUS | Status: DC | PRN
  Administered 2023-09-14: 10 mL via INTRA_ARTERIAL

## 2023-09-14 MED ORDER — HEPARIN SODIUM (PORCINE) 1000 UNIT/ML IJ SOLN
INTRAMUSCULAR | Status: DC | PRN
  Administered 2023-09-14: 5000 [IU] via INTRAVENOUS

## 2023-09-14 MED ORDER — ASPIRIN 325 MG PO TBEC
325.0000 mg | DELAYED_RELEASE_TABLET | Freq: Once | ORAL | Status: AC
  Administered 2023-09-14: 325 mg via ORAL
  Filled 2023-09-14: qty 1

## 2023-09-14 MED ORDER — ASPIRIN 81 MG PO CHEW: 81.0000 mg | CHEWABLE_TABLET | ORAL | Status: DC

## 2023-09-14 MED ORDER — SODIUM CHLORIDE 0.9 % IV SOLN: 250.0000 mL | INTRAVENOUS | Status: DC | PRN

## 2023-09-14 MED ORDER — SERTRALINE HCL 100 MG PO TABS: 100.0000 mg | ORAL_TABLET | Freq: Every day | ORAL | Status: DC

## 2023-09-14 MED ORDER — SODIUM CHLORIDE 0.9 % WEIGHT BASED INFUSION: 3.0000 mL/kg/h | INTRAVENOUS | Status: DC

## 2023-09-14 MED ORDER — MIDAZOLAM HCL 2 MG/2ML IJ SOLN
INTRAMUSCULAR | Status: DC | PRN
  Administered 2023-09-14 (×2): 1 mg via INTRAVENOUS

## 2023-09-14 MED ORDER — PREDNISONE 20 MG PO TABS: 10.0000 mg | ORAL_TABLET | Freq: Every day | ORAL | Status: DC

## 2023-09-14 MED ORDER — ONDANSETRON HCL 4 MG/2ML IJ SOLN: 4.0000 mg | Freq: Four times a day (QID) | INTRAMUSCULAR | Status: DC | PRN

## 2023-09-14 SURGICAL SUPPLY — 9 items
CATH 5FR JL3.5 JR4 ANG PIG MP (CATHETERS) IMPLANT
DEVICE RAD COMP TR BAND LRG (VASCULAR PRODUCTS) IMPLANT
GLIDESHEATH SLEND SS 6F .021 (SHEATH) IMPLANT
GUIDEWIRE INQWIRE 1.5J.035X260 (WIRE) IMPLANT
KIT HEART LEFT (KITS) IMPLANT
PACK CARDIAC CATHETERIZATION (CUSTOM PROCEDURE TRAY) ×1 IMPLANT
SHEATH PROBE COVER 6X72 (BAG) IMPLANT
TRANSDUCER W/STOPCOCK (MISCELLANEOUS) IMPLANT
TUBING CIL FLEX 10 FLL-RA (TUBING) IMPLANT

## 2023-09-14 NOTE — ED Notes (Signed)
 Pt transferred to yellow er room 39 on stretcher

## 2023-09-14 NOTE — H&P (Addendum)
 Cardiology Admission History and Physical  Patient ID: [name] MRN: [id number]; DOB: Sep 21, 1955   Admission date: 09/14/2023  PCP:  Micheal Wolm ORN, MD   [facility] Providers Cardiologist:  Oneil Parchment, MD     Chief Complaint: Chest pain  Patient Profile: [name] is a 68 y.o. female with past medical history of atrial tachycardia, hyperlipidemia, aortic atherosclerosis, GERD, fibromyalgia, rheumatoid arthritis who is being seen 09/14/2023 for the evaluation of chest pain.  History of Present Illness: [name] has past medical history as listed above. She presented to the [facility] ED on 09/14/2023 complaining of chest pain. She reported several episodes of chest tightness, diaphoresis, near syncope. She reported that the chest pain would radiate to her left jaw.   Relevant workup in the ED includes: troponin negative x 2, CBC normal, BMP normal, CXR showed no acute abnormalities. EKG shows sinus rhythm with no acute ischemic changes   She was recently seen in clinic by Jackee Alberts, NP on 08/19/2023 for regular follow up. At this appointment she denied any chest pain, palpitations, dyspnea, orthopnea, N/V, dizziness, syncope, near syncope, edema. Her current cardiac medications at this time included: amlodipine  5 mg daily, lisinopril  10 mg daily, Crestor  20 mg daily. It discusses metoprolol  but I do not see it on her medication list outside of receiving it as a one time dose prior to her coronary CTA. After speaking with the patient, I confirmed that she has not been prescribed metoprolol .   She had a coronary CTA completed 01/2020 that showed calcium  score of 71 (81th percentile), non-flow limiting proximal LAD plaque with 25-49% stenosis, mild non-obstructive CAD. She wore a long term monitor for palpitations 02/2020 that showed rare episodes of paroxysmal atrial tachycardia, no A. Fib.   After speaking with the patient, she agrees with the history as stated  above.  She tells me that she had her first episode of chest pain yesterday while at work.  During this episode of chest pain she experienced diaphoresis, radiation of the pain to her left jaw/arm, near syncope.  It resolved slightly but she states that she did not feel entirely resolved between yesterday and today.  Today when driving to work, she had a similar experience of chest tightness, diaphoresis, near syncop  The second episode along with never having full resolution of her symptoms with prompted her to come to the emergency department today.  Past Medical History:  Diagnosis Date   ANGIOMA 10/03/2008   REMOVED FROM RIGHT LOWER LEG-BENIGN   Anxiety    Aortic atherosclerosis (HCC)    Arthritis    RA   Bronchitis    hx of    Cancer (HCC)    BASAL CELL SKIN CANCER   Complication of anesthesia    Coronary artery disease    COVID-19 06/18/2020   HA, chills, myalgias, congestion & fever, all symptoms resolved as of 12/29/20 per patient   Depression    Fibromyalgia    GERD 08/27/2008   History of blood transfusion    1995   History of hiatal hernia    hx of has had surgically repaired   History of measles    History of mumps    History of nonmelanoma skin cancer    HYPERLIPIDEMIA 08/27/2008   HYPERTENSION 08/27/2008   pt is currently on no meds    Pneumonia    hx of    PONV (postoperative nausea and vomiting)    Rheumatoid arthritis (HCC)  SEBORRHEIC KERATOSIS 08/27/2008   Sinus headache    Vocal cord paresis    hx of    Wears glasses    Wears partial dentures    Past Surgical History:  Procedure Laterality Date   ABDOMINAL HYSTERECTOMY  1995   with appendectomy   BREAST BIOPSY Left 2013   BREAST DUCTAL SYSTEM EXCISION  08/16/2011   Procedure: EXCISION DUCTAL SYSTEM BREAST;  Surgeon: Debby A. Cornett, MD;  Location: [facility];  Service: General;  Laterality: Left;  left breast duct excision   CARPAL TUNNEL RELEASE Right 01/01/2021   Procedure:  CARPAL TUNNEL RELEASE;  Surgeon: Alyse Agent, MD;  Location: [facility];  Service: Orthopedics;  Laterality: Right;   CESAREAN SECTION  1980   times 2; 1980 and 1982   CESAREAN SECTION  1982   CHOLECYSTECTOMY  1997   COLONOSCOPY  09/15/2018   EXCISION/RELEASE BURSA HIP Right 10/21/2014   Procedure: OPEN RIGHT HIP BURSECTOMY, EXOSECTOMY;  Surgeon: Donnice Car, MD;  Location: [facility];  Service: Orthopedics;  Laterality: Right;   foot surgery  2004, 2005, 2008   bilat secondary to neuropathy    JOINT REPLACEMENT  11/2010   lt total knee X2   KNEE ARTHROSCOPY  2007, 2008, 2009   LAPAROSCOPIC GASTRIC SLEEVE RESECTION N/A 10/30/2013   Procedure: LAPAROSCOPIC GASTRIC SLEEVE RESECTION WITH HIATAL HERNIA REPAIR AND UPPER ENDOSCOPY;  Surgeon: Camellia Blush, MD;  Location: [facility];  Service: General;  Laterality: N/A;   NASAL SINUS SURGERY  2013   OPEN SURGICAL REPAIR OF GLUTEAL TENDON  01/17/2012   Procedure: OPEN SURGICAL REPAIR OF GLUTEAL TENDON;  Surgeon: Donnice JONETTA Car, MD;  Location: [facility];  Service: Orthopedics;  Laterality: Right;   REVERSE SHOULDER ARTHROPLASTY Left 02/18/2022   Procedure: REVERSE SHOULDER ARTHROPLASTY;  Surgeon: Melita Drivers, MD;  Location: [facility];  Service: Orthopedics;  Laterality: Left;   ROTATOR CUFF REPAIR Bilateral 2020   2021   TOTAL KNEE ARTHROPLASTY Right 04/15/2020   Procedure: TOTAL KNEE ARTHROPLASTY;  Surgeon: Car Donnice, MD;  Location: [facility];  Service: Orthopedics;  Laterality: Right;  70 mins   ULNAR TUNNEL RELEASE Right 01/01/2021   Procedure: CUBITAL TUNNEL RELEASE;  Surgeon: Alyse Agent, MD;  Location: [facility];  Service: Orthopedics;  Laterality: Right;  with MAC anesthesia needs 45 minutes    edications Prior to Admission: Prior to Admission medications   Medication Sig Start Date End Date Taking? Authorizing Provider  abatacept  (ORENCIA ) 250 MG injection Inject 750 mg into the vein every 30 (thirty) days.     [provider]  amLODipine  (NORVASC ) 5 MG tablet TAKE 1 TABLET DAILY 06/01/23   Burchette, Wolm ORN, MD  B-D TB SYRINGE 1CC/27GX1/2 27G X 1/2 1 ML MISC  01/29/20   [provider]  Biotin 10 MG CAPS Take 10 mg by mouth daily.    [provider]  cyclobenzaprine  (FLEXERIL ) 10 MG tablet Take 1 tablet (10 mg total) by mouth 3 (three) times daily as needed for muscle spasms. Patient not taking: Reported on 08/19/2023 02/18/22   Shuford, Randine, PA-C  doxycycline  (VIBRA -TABS) 100 MG tablet Take 1 tablet (100 mg total) by mouth 2 (two) times daily. Patient not taking: Reported on 08/19/2023 03/22/23   Micheal Wolm ORN, MD  doxycycline  (VIBRAMYCIN ) 100 MG capsule Take 1 capsule (100 mg total) by mouth 2 (two) times daily. Patient not taking: Reported on 08/19/2023 04/11/23   Micheal Wolm ORN, MD  EPINEPHrine  (EPIPEN   2-PAK) 0.3 mg/0.3 mL IJ SOAJ injection Inject 0.3 mLs (0.3 mg total) into the muscle once. 10/16/18   Burchette, Wolm ORN, MD  famotidine  (PEPCID ) 20 MG tablet Take 1 tablet (20 mg total) by mouth 2 (two) times daily. 11/01/22   Silver Wonda LABOR, PA  fluticasone  (FLONASE ) 50 MCG/ACT nasal spray Place 2 sprays into both nostrils daily. 03/21/23   Richad Jon HERO, NP  folic acid  (FOLVITE ) 800 MCG tablet Take 1,600 mcg by mouth daily.    [provider]  lisinopril  (ZESTRIL ) 10 MG tablet TAKE ONE TABLET DAILY 08/01/23   Burchette, Wolm ORN, MD  Methotrexate  25 MG/ML SOSY Inject 25 mg into the skin every 7 (seven) days.    [provider]  methotrexate  50 MG/2ML injection Inject 4 mLs into the muscle once a week. 03/28/23   [provider]  metoCLOPramide  (REGLAN ) 10 MG tablet Take 1 tablet (10 mg total) by mouth every 8 (eight) hours as needed for nausea. 05/05/23   Schutt, Marsa HERO, PA-C  ondansetron  (ZOFRAN -ODT) 4 MG disintegrating tablet Take 1 tablet (4 mg total) by mouth every 8 (eight) hours as needed. 11/01/22   Silver Wonda LABOR, PA  oxyCODONE   (OXY IR/ROXICODONE ) 5 MG immediate release tablet Take 1 tablet (5 mg total) by mouth every 4 (four) hours as needed for pain. 09/15/23     polyethylene glycol (MIRALAX  / GLYCOLAX ) 17 g packet Take 17 g by mouth daily.    [provider]  predniSONE  (DELTASONE ) 10 MG tablet Take 10 mg by mouth daily with breakfast.    [provider]  rosuvastatin  (CRESTOR ) 20 MG tablet Take 1 tablet (20 mg total) by mouth daily. 06/15/23   Jeffrie Oneil BROCKS, MD  sertraline  (ZOLOFT ) 100 MG tablet TAKE ONE TABLET DAILY 08/01/23   Burchette, Wolm ORN, MD  Tavaborole  (KERYDIN ) 5 % SOLN Apply 1 drop topically daily. Apply 1 drop to the toenail daily. 02/21/23   Gershon Donnice SAUNDERS, DPM  zolpidem  (AMBIEN ) 10 MG tablet TAKE ONE TABLET AT BEDTIME AS NEEDED FOR SLEEP 09/05/23   Burchette, Wolm ORN, MD    Allergies:    Allergies  Allergen Reactions   Bee Venom Anaphylaxis   Keflex  [Cephalexin ] Hives    Pt took po kelfex for sinus infection, developed blisters over entire body   Codeine Sulfate Other (See Comments)    GI upset   Penicillamine Other (See Comments)   Penicillins Rash   Social History:   Social History   Socioeconomic History   Marital status: Married    Spouse name: Not on file   Number of children: 2   Years of education: Not on file   Highest education level: Associate degree: occupational, Scientist, product/process development, or vocational program  Occupational History   Occupation: retired    Comment: Arts administrator  Tobacco Use   Smoking status: Former    Current packs/day: 0.00    Average packs/day: 0.3 packs/day for 4.0 years (1.0 ttl pk-yrs)    Types: Cigarettes    Start date: 02/23/1980    Quit date: 02/23/1984    Years since quitting: 39.5   Smokeless tobacco: Never  Vaping Use   Vaping status: Never Used  Substance and Sexual Activity   Alcohol use: No   Drug use: No   Sexual activity: Not on file  Other Topics Concern   Not on file  Social History Narrative   Lives at home with husband    Disabled   Right handed  Caffeine: 1 cup of coffee daily   Social Drivers of Corporate investment banker Strain: Low Risk  (04/10/2023)   Overall Financial Resource Strain (CARDIA)    Difficulty of Paying Living Expenses: Not hard at all  Food Insecurity: No Food Insecurity (04/10/2023)   Hunger Vital Sign    Worried About Running Out of Food in the Last Year: Never true    Ran Out of Food in the Last Year: Never true  Transportation Needs: No Transportation Needs (04/10/2023)   PRAPARE - Administrator, Civil Service (Medical): No    Lack of Transportation (Non-Medical): No  Physical Activity: Insufficiently Active (04/10/2023)   Exercise Vital Sign    Days of Exercise per Week: 4 days    Minutes of Exercise per Session: 20 min  Stress: No Stress Concern Present (04/10/2023)   [employer] of Occupational Health - Occupational Stress Questionnaire    Feeling of Stress : Not at all  Social Connections: Moderately Integrated (04/10/2023)   Social Connection and Isolation Panel    Frequency of Communication with Friends and Family: More than three times a week    Frequency of Social Gatherings with Friends and Family: Once a week    Attends Religious Services: More than 4 times per year    Active Member of [employer] or Organizations: No    Attends Banker Meetings: Not on file    Marital Status: Married  Catering manager Violence: Not At Risk (07/11/2020)   Humiliation, Afraid, Rape, and Kick questionnaire    Fear of Current or Ex-Partner: No    Emotionally Abused: No    Physically Abused: No    Sexually Abused: No    Family History:   The patient's family history includes Breast cancer (age of onset: 56) in her mother; Cancer in her mother; Diabetes in her son; Epilepsy in her father; Heart attack in her sister and sister; Kidney disease in her father.    ROS:  Please see the history of present illness.  All other ROS reviewed and negative.      Physical Exam/Data: Vitals:   09/14/23 0848 09/14/23 0855 09/14/23 1130 09/14/23 1318  BP:  (!) 143/56 (!) 119/55 138/61  Pulse:  63 (!) 55 (!) 56  Resp:  16 (!) 6 14  Temp:  97.8 F (36.6 C)  97.7 F (36.5 C)  TempSrc:  Oral  Oral  SpO2:  100% 100% 100%  Weight: 97.5 kg     Height: 5' 4 (1.626 m)      No intake or output data in the 24 hours ending 09/14/23 1447    09/14/2023    8:48 AM 08/19/2023    3:31 PM 04/11/2023    2:43 PM  Last 3 Weights  Weight (lbs) 215 lb 214 lb 3.2 oz 212 lb  Weight (kg) 97.523 kg 97.16 kg 96.163 kg     Body mass index is 36.9 kg/m.   General:  in no acute distress, on room air  HEENT: normal Neck: no JVD Vascular: bilateral carotid bruits L > R; Distal pulses 2+ bilaterally   Cardiac:  normal S1, S2; RRR; soft systolic murmur at RUSB  Lungs:  clear to auscultation bilaterally, no wheezing, rhonchi or rales  Abd: soft, nontender Ext: no edema Musculoskeletal:  No deformities Skin: warm and dry  Neuro:  no focal abnormalities noted Psych:  Normal affect   EKG:  The ECG that was done 09/14/2023 was personally reviewed and demonstrates  sinus rhythm, HR 62  Relevant CV Studies:  Echocardiogram, 02/08/2020 Left ventricular ejection fraction, by estimation, is 60 to 65% . The left ventricle has normal function. The left ventricle has no regional wall motion abnormalities. Left ventricular diastolic parameters were normal.  Right ventricular systolic function is normal. The right ventricular size is normal. There is mildly elevated pulmonary artery systolic pressure. The estimated right ventricular systolic pressure is 36. 9 mmHg.  The mitral valve is normal in structure. Mild mitral valve regurgitation. No evidence of mitral stenosis.  The aortic valve is normal in structure. Aortic valve regurgitation is not visualized. No aortic stenosis is present.  The inferior vena cava is normal in size with greater than 50% respiratory variability,  suggesting right atrial pressure of 3 mmHg.  Coronary CTA 01/28/2020 Coronary calcium  score of 71. This was 30 percentile for age and sex matched control. Normal coronary origin with right dominance. There is focal calcified proximal LAD plaque that is 25-49% stenosis. Non flow limiting. CAD-RADS 2. Mild non-obstructive CAD (25-49%). Consider non-atherosclerotic causes of chest pain. Consider preventive therapy and risk factor modification  Aortic atherosclerosis.  Laboratory Data: High Sensitivity Troponin:   Recent Labs  Lab 09/14/23 1015 09/14/23 1207  TROPONINIHS 2 <2      Chemistry Recent Labs  Lab 09/14/23 1015  NA 140  K 3.9  CL 103  CO2 25  GLUCOSE 100*  BUN 22  CREATININE 0.64  CALCIUM  9.6  GFRNONAA >60  ANIONGAP 12    No results for input(s): PROT, ALBUMIN, AST, ALT, ALKPHOS, BILITOT in the last 168 hours. Lipids No results for input(s): CHOL, TRIG, HDL, LABVLDL, LDLCALC, CHOLHDL in the last 168 hours. Hematology Recent Labs  Lab 09/14/23 1015  WBC 6.5  RBC 4.24  HGB 12.3  HCT 39.5  MCV 93.2  MCH 29.0  MCHC 31.1  RDW 14.3  PLT 272   Thyroid  No results for input(s): TSH, FREET4 in the last 168 hours. BNPNo results for input(s): BNP, PROBNP in the last 168 hours.  DDimer No results for input(s): DDIMER in the last 168 hours.  Radiology/Studies:  DG Chest Port 1 View Result Date: 09/14/2023 CLINICAL DATA:  Chest pain EXAM: PORTABLE CHEST 1 VIEW COMPARISON:  Chest x-ray performed February 23, 2022 FINDINGS: Heart mediastinum are not significantly changed. No focal infiltrate. Left shoulder prosthesis. IMPRESSION: 1. No focal infiltrate, pleural effusion, or pneumothorax. Electronically Signed   By: Maude Naegeli M.D.   On: 09/14/2023 09:39   Assessment and Plan:  Chest pain Non-obstructive CAD per coronary CTA 2021 Reported episodes of chest pain over last two days  First episode while at work, second one this AM while  driving to work Associated diaphoresis, near syncope Reported radiation to left jaw/arm Time in between the two episodes she reported lingering chest tightness Troponin negative x 2 Coronary CTA from 01/2020 showed: calcium  score 71 (88th percentile), non-flowing limiting prox LAD plaque at 25-49% stenosed Given ASA 342 mg in ED Given SL NTG x 3 doses with minimal relief  Given history of chest pain with diaphoresis, near syncope, radiation to left jaw/arm while at rest and previous coronary calcium  score of 71 (88 th percentile) believe it would be reasonable to get cardiac cath, will discuss with MD Patient has been NPO today, see if we can get her into the lab today Order echo Check lipid panel in AM Check A1C  Informed Consent   Shared Decision Making/Informed Consent The risks [stroke (1 in 1000),  death (1 in 1000), kidney failure [usually temporary] (1 in 500), bleeding (1 in 200), allergic reaction [possibly serious] (1 in 200)], benefits (diagnostic support and management of coronary artery disease) and alternatives of a cardiac catheterization were discussed in detail with [name] and she is willing to proceed.    Hypertension  Home meds: amlodipine  5 mg daily, lisinopril  10 mg daily Most recent BP 138/61  History of atrial tachycardia  Noted rare events of atrial tachycardia on long term monitor  Patient denies any palpitations today but has had them in the recent past Prior notes indicate that patient was on metoprolol , this is not accurate, she was given one time dose prior to her coronary CTA in 2021 but has not been prescribed a daily beta-blocker  HR remained around 50-60s when I was in the room  Hyperlipidemia  12/16/2022: HDL 77.70; LDL Cholesterol 57 05/05/2023: ALT 34  Home meds: Crestor  20 mg daily Continue home Crestor  20 mg daily   Rheumatoid arthritis Fibromyalgia Home meds: Prednisone  10 mg daily, oxycodone  5 mg Q4 PRN, methotrexate   Depression Home  meds: Zoloft  100 mg daily  Insomnia  Home meds: Ambien  10 mg daily   Risk Assessment/Risk Scores:   TIMI Risk Score for Unstable Angina or Non-ST Elevation MI:   The patient's TIMI risk score is 4, which indicates a 20% risk of all cause mortality, new or recurrent myocardial infarction or need for urgent revascularization in the next 14 days.    Code Status: Full Code  Severity of Illness: The appropriate patient status for this patient is OBSERVATION. Observation status is judged to be reasonable and necessary in order to provide the required intensity of service to ensure the patient's safety. The patient's presenting symptoms, physical exam findings, and initial radiographic and laboratory data in the context of their medical condition is felt to place them at decreased risk for further clinical deterioration. Furthermore, it is anticipated that the patient will be medically stable for discharge from the hospital within 2 midnights of admission.   For questions or updates, please contact [facility] Please consult [email or web address] for contact info under     Signed, Waddell DELENA Donath, PA-C  09/14/2023 2:47 PM

## 2023-09-14 NOTE — ED Notes (Signed)
 CCMD called, pt added to monitor.

## 2023-09-14 NOTE — Discharge Instructions (Signed)

## 2023-09-14 NOTE — Discharge Summary (Signed)
 Discharge Summary   Patient ID: [name] MRN: [id number]; DOB: 01-24-1956  Admit date: 09/14/2023 Discharge date: 09/14/2023  PCP:  Micheal Wolm ORN, MD   [facility] Providers Cardiologist:  Oneil Parchment, MD     Discharge Diagnoses  Principal Problem:   Unstable angina [facility]) Active Problems:   Hypercholesteremia   Aortic atherosclerosis (HCC)   H/O rheumatoid arthritis  Diagnostic Studies/Procedures  Cardiac catheterization, 09/14/2023   The left ventricular systolic function is normal.   LV end diastolic pressure is normal.   The left ventricular ejection fraction is 55-65% by visual estimate.   No significant CAD Normal LV function Normal LVEDP _____________   History of Present Illness   [name] is a 68 y.o. female with atrial tachycardia, hyperlipidemia, aortic atherosclerosis, GERD, fibromyalgia, rheumatoid arthritis who presented to [facility] ED on 09/14/2023 for evaluation of chest pain.   Hospital Course    She presented to the [facility] ED on 09/14/2023 complaining of chest pain. She reported several episodes of chest tightness, diaphoresis, near syncope. She reported that the chest pain would radiate to her left jaw.    Relevant workup in the ED includes: troponin negative x 2, CBC normal, BMP normal, CXR showed no acute abnormalities. EKG shows sinus rhythm with no acute ischemic changes.  She was given SL NTG x 3, ASA 325 mg while in the ED. The decision was made to undergo cardiac cath. She had been NPO all day and was able to have cath same day by Dr. [place]. He reported that the cath was clean and patient was able to discharge home the same day. Full report as above. Will make follow up appointment with cardiology to reevaluate symptoms and see if there are any med changes that need to be made.      Did the patient have an acute coronary syndrome (MI, NSTEMI, STEMI, etc) this admission?:  No                               Did the  patient have a percutaneous coronary intervention (stent / angioplasty)?:  No.    _____________  Discharge Vitals Blood pressure 138/61, pulse (!) 56, temperature 97.7 F (36.5 C), temperature source Oral, resp. rate 14, height 5' 4 (1.626 m), weight 97.5 kg, SpO2 100%.  Filed Weights   09/14/23 0848  Weight: 97.5 kg   Labs & Radiologic Studies  CBC Recent Labs    09/14/23 1015  WBC 6.5  HGB 12.3  HCT 39.5  MCV 93.2  PLT 272   Basic Metabolic Panel Recent Labs    92/76/74 1015  NA 140  K 3.9  CL 103  CO2 25  GLUCOSE 100*  BUN 22  CREATININE 0.64  CALCIUM  9.6   Liver Function Tests No results for input(s): AST, ALT, ALKPHOS, BILITOT, PROT, ALBUMIN in the last 72 hours. No results for input(s): LIPASE, AMYLASE in the last 72 hours. High Sensitivity Troponin:   Recent Labs  Lab 09/14/23 1015 09/14/23 1207  TROPONINIHS 2 <2    No results for input(s): TRNPT in the last 720 hours.  BNP Invalid input(s): POCBNP No results for input(s): PROBNP in the last 72 hours.  No results for input(s): BNP in the last 72 hours.  D-Dimer No results for input(s): DDIMER in the last 72 hours. Hemoglobin A1C No results for input(s): HGBA1C in the last 72 hours. Fasting Lipid Panel  No results for input(s): CHOL, HDL, LDLCALC, TRIG, CHOLHDL, LDLDIRECT in the last 72 hours. No results found for: LIPOA  Thyroid  Function Tests No results for input(s): TSH, T4TOTAL, T3FREE, THYROIDAB in the last 72 hours.  Invalid input(s): FREET3 _____________  CARDIAC CATHETERIZATION Result Date: 09/14/2023   The left ventricular systolic function is normal.   LV end diastolic pressure is normal.   The left ventricular ejection fraction is 55-65% by visual estimate. No significant CAD Normal LV function Normal LVEDP  DG Chest Port 1 View Result Date: 09/14/2023 CLINICAL DATA:  Chest pain EXAM: PORTABLE CHEST 1 VIEW COMPARISON:  Chest x-ray  performed February 23, 2022 FINDINGS: Heart mediastinum are not significantly changed. No focal infiltrate. Left shoulder prosthesis. IMPRESSION: 1. No focal infiltrate, pleural effusion, or pneumothorax. Electronically Signed   By: Maude Naegeli M.D.   On: 09/14/2023 09:39   Disposition Pt is being discharged home today in good condition per MD.  Follow-up Plans & Appointments  Discharge Instructions     Call MD for:  redness, tenderness, or signs of infection (pain, swelling, redness, odor or green/yellow discharge around incision site)   Complete by: As directed    Discharge instructions   Complete by: As directed    Radial Site Care Refer to this sheet in the next few weeks. These instructions provide you with information on caring for yourself after your procedure. Your caregiver may also give you more specific instructions. Your treatment has been planned according to current medical practices, but problems sometimes occur. Call your caregiver if you have any problems or questions after your procedure.  HOME CARE INSTRUCTIONS You may shower the day after the procedure. Remove the bandage (dressing) and gently wash the site with plain soap and water . Gently pat the site dry.  Do not apply powder or lotion to the site.  Do not submerge the affected site in water  for 3 to 5 days.  Inspect the site at least twice daily.  Do not flex or bend the affected arm for 24 hours.  No lifting over 5 pounds (2.3 kg) for 5 days after your procedure.  Do not drive home if you are discharged the same day of the procedure. Have someone else drive you.  You may drive 24 hours after the procedure unless otherwise instructed by your caregiver.   What to expect: Any bruising will usually fade within 1 to 2 weeks.  Blood that collects in the tissue (hematoma) may be painful to the touch. It should usually decrease in size and tenderness within 1 to 2 weeks.   SEEK IMMEDIATE MEDICAL CARE IF: You have  unusual pain at the radial site.  You have redness, warmth, swelling, or pain at the radial site.  You have drainage (other than a small amount of blood on the dressing).  You have chills.  You have a fever or persistent symptoms for more than 72 hours.  You have a fever and your symptoms suddenly get worse.  Your arm becomes pale, cool, tingly, or numb.  You have heavy bleeding from the site. Hold pressure on the site.      Future Appointments  Date Time Provider Department Center  11/29/2023  8:40 AM Jeffrie Oneil BROCKS, MD [facility]   Discharge Medications Allergies as of 09/14/2023       Reactions   Bee Venom Anaphylaxis   Keflex  [cephalexin ] Hives   Pt took po kelfex for sinus infection, developed blisters over entire body   Codeine  Sulfate Other (See Comments)   GI upset   Penicillamine Other (See Comments)   Penicillins Rash        Medication List     STOP taking these medications    cyclobenzaprine  10 MG tablet Commonly known as: FLEXERIL    doxycycline  100 MG capsule Commonly known as: VIBRAMYCIN    doxycycline  100 MG tablet Commonly known as: VIBRA -TABS       TAKE these medications    abatacept  250 MG injection Commonly known as: ORENCIA  Inject 750 mg into the vein every 30 (thirty) days.   amLODipine  5 MG tablet Commonly known as: NORVASC  TAKE 1 TABLET DAILY   Biotin 10 MG Caps Take 10 mg by mouth daily.   EPINEPHrine  0.3 mg/0.3 mL Soaj injection Commonly known as: EpiPen  2-Pak Inject 0.3 mLs (0.3 mg total) into the muscle once.   famotidine  20 MG tablet Commonly known as: PEPCID  Take 1 tablet (20 mg total) by mouth 2 (two) times daily.   fluticasone  50 MCG/ACT nasal spray Commonly known as: FLONASE  Place 2 sprays into both nostrils daily.   folic acid  800 MCG tablet Commonly known as: FOLVITE  Take 1,600 mcg by mouth daily.   lisinopril  10 MG tablet Commonly known as: ZESTRIL  TAKE ONE TABLET DAILY   Methotrexate  25 MG/ML  Sosy Inject 25 mg into the skin every 7 (seven) days.   methotrexate  50 MG/2ML injection Inject 4 mLs into the muscle once a week.   metoCLOPramide  10 MG tablet Commonly known as: REGLAN  Take 1 tablet (10 mg total) by mouth every 8 (eight) hours as needed for nausea.   ondansetron  4 MG disintegrating tablet Commonly known as: ZOFRAN -ODT Take 1 tablet (4 mg total) by mouth every 8 (eight) hours as needed.   oxyCODONE  5 MG immediate release tablet Commonly known as: Oxy IR/ROXICODONE  Take 1 tablet (5 mg total) by mouth every 4 (four) hours as needed for pain. Start taking on: September 15, 2023   pantoprazole  40 MG tablet Commonly known as: PROTONIX  Take 40 mg by mouth every morning.   polyethylene glycol 17 g packet Commonly known as: MIRALAX  / GLYCOLAX  Take 17 g by mouth daily.   predniSONE  10 MG tablet Commonly known as: DELTASONE  Take 10 mg by mouth daily with breakfast.   rosuvastatin  20 MG tablet Commonly known as: CRESTOR  Take 1 tablet (20 mg total) by mouth daily.   sertraline  100 MG tablet Commonly known as: ZOLOFT  TAKE ONE TABLET DAILY   Tavaborole  5 % Soln Commonly known as: Kerydin  Apply 1 drop topically daily. Apply 1 drop to the toenail daily.   zolpidem  10 MG tablet Commonly known as: AMBIEN  TAKE ONE TABLET AT BEDTIME AS NEEDED FOR SLEEP        Outstanding Labs/Studies N/A  Duration of Discharge Encounter: APP Time: 25 minutes   Signed, Waddell DELENA Donath, PA-C 09/14/2023, 4:49 PM

## 2023-09-14 NOTE — ED Provider Notes (Signed)
 [place] EMERGENCY DEPARTMENT AT [facility] Provider Note   CSN: [id number] Arrival date & time: 09/14/23  9160     Patient presents with: Chest Pain, Dizziness, Jaw Pain, and Back Pain   [name] is a 68 y.o. female.   This is a 68 year old female presenting emergency department for chest pain and near syncope.  Reports yesterday had several episodes of chest tightness associated with diaphoresis.  Episodes happened at work.  Would sit down and symptoms would subside.  On the way to work this morning chest pain returned, radiated to left jaw.  Associated diaphoresis.  She also notes she felt quite lightheaded and that she may pass out.  Got to work and nurse took blood pressure noted it was 180 systolic.  Reports she is still having some minor chest tightness, but greatly improved.  No medications prior to arrival.  Reports that she recently saw her cardiologist and was told everything was okay.  Denies having recent stress test or catheterization.   Chest Pain Associated symptoms: back pain and dizziness   Dizziness Associated symptoms: chest pain   Back Pain Associated symptoms: chest pain        Prior to Admission medications   Medication Sig Start Date End Date Taking? Authorizing Provider  pantoprazole  (PROTONIX ) 40 MG tablet Take 40 mg by mouth every morning. 06/21/23  Yes [provider]  abatacept  (ORENCIA ) 250 MG injection Inject 750 mg into the vein every 30 (thirty) days.    [provider]  amLODipine  (NORVASC ) 5 MG tablet TAKE 1 TABLET DAILY 06/01/23   Burchette, Wolm ORN, MD  Biotin 10 MG CAPS Take 10 mg by mouth daily.    [provider]  cyclobenzaprine  (FLEXERIL ) 10 MG tablet Take 1 tablet (10 mg total) by mouth 3 (three) times daily as needed for muscle spasms. Patient not taking: Reported on 08/19/2023 02/18/22   Shuford, Randine, PA-C  doxycycline  (VIBRA -TABS) 100 MG tablet Take 1 tablet (100 mg total) by mouth 2 (two)  times daily. Patient not taking: Reported on 08/19/2023 03/22/23   Micheal Wolm ORN, MD  doxycycline  (VIBRAMYCIN ) 100 MG capsule Take 1 capsule (100 mg total) by mouth 2 (two) times daily. Patient not taking: Reported on 08/19/2023 04/11/23   Micheal Wolm ORN, MD  EPINEPHrine  (EPIPEN  2-PAK) 0.3 mg/0.3 mL IJ SOAJ injection Inject 0.3 mLs (0.3 mg total) into the muscle once. 10/16/18   Burchette, Wolm ORN, MD  famotidine  (PEPCID ) 20 MG tablet Take 1 tablet (20 mg total) by mouth 2 (two) times daily. 11/01/22   Silver Wonda LABOR, PA  fluticasone  (FLONASE ) 50 MCG/ACT nasal spray Place 2 sprays into both nostrils daily. 03/21/23   Richad Jon HERO, NP  folic acid  (FOLVITE ) 800 MCG tablet Take 1,600 mcg by mouth daily.    [provider]  lisinopril  (ZESTRIL ) 10 MG tablet TAKE ONE TABLET DAILY 08/01/23   Burchette, Wolm ORN, MD  Methotrexate  25 MG/ML SOSY Inject 25 mg into the skin every 7 (seven) days.    [provider]  methotrexate  50 MG/2ML injection Inject 4 mLs into the muscle once a week. 03/28/23   [provider]  metoCLOPramide  (REGLAN ) 10 MG tablet Take 1 tablet (10 mg total) by mouth every 8 (eight) hours as needed for nausea. 05/05/23   Schutt, Marsa HERO, PA-C  ondansetron  (ZOFRAN -ODT) 4 MG disintegrating tablet Take 1 tablet (4 mg total) by mouth every 8 (eight) hours as needed. 11/01/22   Silver Wonda LABOR,  PA  oxyCODONE  (OXY IR/ROXICODONE ) 5 MG immediate release tablet Take 1 tablet (5 mg total) by mouth every 4 (four) hours as needed for pain. 09/15/23     polyethylene glycol (MIRALAX  / GLYCOLAX ) 17 g packet Take 17 g by mouth daily.    [provider]  predniSONE  (DELTASONE ) 10 MG tablet Take 10 mg by mouth daily with breakfast.    [provider]  rosuvastatin  (CRESTOR ) 20 MG tablet Take 1 tablet (20 mg total) by mouth daily. 06/15/23   Jeffrie Oneil BROCKS, MD  sertraline  (ZOLOFT ) 100 MG tablet TAKE ONE TABLET DAILY 08/01/23   Burchette, Wolm ORN, MD  Tavaborole   (KERYDIN ) 5 % SOLN Apply 1 drop topically daily. Apply 1 drop to the toenail daily. 02/21/23   Gershon Donnice SAUNDERS, DPM  zolpidem  (AMBIEN ) 10 MG tablet TAKE ONE TABLET AT BEDTIME AS NEEDED FOR SLEEP 09/05/23   Burchette, Wolm ORN, MD    Allergies: Bee venom, Keflex  [cephalexin ], Codeine sulfate, Penicillamine, and Penicillins    Review of Systems  Cardiovascular:  Positive for chest pain.  Musculoskeletal:  Positive for back pain.  Neurological:  Positive for dizziness.    Updated Vital Signs BP 138/61 (BP Location: Right Arm)   Pulse (!) 56   Temp 97.7 F (36.5 C) (Oral)   Resp 14   Ht 5' 4 (1.626 m)   Wt 97.5 kg   SpO2 100%   BMI 36.90 kg/m   Physical Exam Vitals and nursing note reviewed.  Constitutional:      General: She is not in acute distress.    Appearance: She is not toxic-appearing.  Cardiovascular:     Rate and Rhythm: Normal rate and regular rhythm.     Pulses:          Radial pulses are 2+ on the right side.     Heart sounds: Normal heart sounds.  Pulmonary:     Effort: Pulmonary effort is normal.     Breath sounds: Normal breath sounds.  Chest:     Chest wall: No tenderness.  Abdominal:     Palpations: Abdomen is soft.  Musculoskeletal:     Right lower leg: No edema.     Left lower leg: No edema.  Skin:    General: Skin is warm and dry.     Capillary Refill: Capillary refill takes less than 2 seconds.  Neurological:     General: No focal deficit present.     Mental Status: She is alert.  Psychiatric:        Mood and Affect: Mood normal.        Behavior: Behavior normal.     (all labs ordered are listed, but only abnormal results are displayed) Labs Reviewed  BASIC METABOLIC PANEL WITH GFR - Abnormal; Notable for the following components:      Result Value   Glucose, Bld 100 (*)    All other components within normal limits  CBC  HEMOGLOBIN A1C  TROPONIN I (HIGH SENSITIVITY)  TROPONIN I (HIGH SENSITIVITY)    EKG: EKG  Interpretation Date/Time:  Wednesday September 14 2023 08:50:20 EDT Ventricular Rate:  63 PR Interval:  158 QRS Duration:  82 QT Interval:  432 QTC Calculation: 442 R Axis:   53  Text Interpretation: Normal sinus rhythm Normal ECG When compared with ECG of 01-Nov-2022 09:22, PREVIOUS ECG IS PRESENT Confirmed by Neysa Clap [phone]) on 09/14/2023 9:18:31 AM  Radiology: [facility] Chest Port 1 View Result Date: 09/14/2023 CLINICAL DATA:  Chest pain  EXAM: PORTABLE CHEST 1 VIEW COMPARISON:  Chest x-ray performed February 23, 2022 FINDINGS: Heart mediastinum are not significantly changed. No focal infiltrate. Left shoulder prosthesis. IMPRESSION: 1. No focal infiltrate, pleural effusion, or pneumothorax. Electronically Signed   By: Maude Naegeli M.D.   On: 09/14/2023 09:39     Procedures   Medications Ordered in the ED  nitroGLYCERIN  (NITROSTAT ) SL tablet 0.4 mg ( Sublingual MAR Hold 09/14/23 1602)  acetaminophen  (TYLENOL ) tablet 650 mg ( Oral MAR Hold 09/14/23 1602)  ondansetron  (ZOFRAN ) injection 4 mg ( Intravenous MAR Hold 09/14/23 1602)  0.9% sodium chloride  infusion (3 mL/kg/hr  97.5 kg Intravenous New Bag/Given 09/14/23 1512)    Followed by  0.9% sodium chloride  infusion (has no administration in time range)  aspirin  chewable tablet 81 mg (has no administration in time range)  predniSONE  (DELTASONE ) tablet 10 mg ( Oral Automatically Held 09/23/23 0800)  rosuvastatin  (CRESTOR ) tablet 20 mg ( Oral Automatically Held 09/23/23 1000)  sertraline  (ZOLOFT ) tablet 100 mg ( Oral Automatically Held 09/23/23 1000)  aspirin  EC tablet 325 mg (325 mg Oral Given 09/14/23 1024)    Clinical Course as of 09/14/23 1614  Wed Sep 14, 2023  1110 Troponin I (High Sensitivity): 2 Initial troponin reassuring.  She still having some chest discomfort despite nitro [TY]  1110 Basic metabolic panel(!) No significant metabolic derangements.  Normal kidney function [TY]  1111 CBC No leukocytosis to suggest infectious process.  No  anemia [TY]    Clinical Course User Index [TY] Neysa Caron PARAS, DO                                 Medical Decision Making This is a 68 year old female presenting emergency department for chest pain.  Concerning story central chest pressure radiating to the jaw with associated diaphoresis.  Symptoms greatly improved, but still having some minor chest discomfort currently.  Her EKG without STEMI or overt EKG changes to indicate ischemia.  Awaiting repeat troponins.  Chest x-ray without pneumonia pneumothorax on my independent review.  Low suspicion for PE, Wells score negative.  Considered dissection, however no widened mediastinum on chest x-ray equal pulses.    Repeat troponin flat.  Patient with heart score of 5 and concerning story.  Case discussed with cardiology will admit patient for further workup  See ED course for further MDM and disposition.  Amount and/or Complexity of Data Reviewed Independent Historian:     Details: Notes patient had near syncopal episode while driving to work External Data Reviewed:     Details: Coronary calcium  score in 2021: IMPRESSION: 1. Coronary calcium  score of 71. This was 94 percentile for age and sex matched control.   2. Normal coronary origin with right dominance.   3. There is focal calcified proximal LAD plaque that is 25-49% stenosis. Non flow limiting.   4. CAD-RADS 2. Mild non-obstructive CAD (25-49%). Consider non-atherosclerotic causes of chest pain. Consider preventive therapy and risk factor modification.   5.  Aortic atherosclerosis.   Oneil Parchment, MD [facility]    Labs:  Decision-making details documented in ED Course. Radiology: independent interpretation performed. ECG/medicine tests: independent interpretation performed.  Risk OTC drugs. Prescription drug management. Decision regarding hospitalization. Diagnosis or treatment significantly limited by social determinants of health.      Final diagnoses:  None    ED  Discharge Orders     None  Neysa Caron PARAS, DO 09/14/23 1614

## 2023-09-14 NOTE — Interval H&P Note (Signed)
 History and Physical Interval Note:  09/14/2023 4:11 PM  [name]  has presented today for surgery, with the diagnosis of chest pain.  The various methods of treatment have been discussed with the patient and family. After consideration of risks, benefits and other options for treatment, the patient has consented to  Procedure(s): LEFT HEART CATH AND CORONARY ANGIOGRAPHY (N/A) as a surgical intervention.  The patient's history has been reviewed, patient examined, no change in status, stable for surgery.  I have reviewed the patient's chart and labs.  Questions were answered to the patient's satisfaction.   Cath Lab Visit (complete for each Cath Lab visit)  Clinical Evaluation Leading to the Procedure:   ACS: Yes.    Non-ACS:    Anginal Classification: CCS III  Anti-ischemic medical therapy: Minimal Therapy (1 class of medications)  Non-Invasive Test Results: No non-invasive testing performed  Prior CABG: No previous CABG        Maude [facility] 09/14/2023 4:12 PM'

## 2023-09-14 NOTE — Progress Notes (Signed)
 Patient and husband was given discharge instructions. Both verbalized understanding.

## 2023-09-14 NOTE — ED Notes (Signed)
 Unable to obtain labs

## 2023-09-14 NOTE — ED Provider Triage Note (Signed)
 Emergency Medicine Provider Triage Evaluation Note  [name] , a 68 y.o. female  was evaluated in triage.  Pt complains of pain.  Near syncopal event.  Patient states this happened at home to work.  She may have noted some of the symptoms yesterday while at work.  No cardiac history.  Does endorse history of hypertension, high cholesterol as well as a family cardiac history.  May have some slight chest pressure with exertion yesterday..  Review of Systems  Positive: Cp Negative: N/V/D, pleuritic chest pain  Physical Exam  Ht 5' 4 (1.626 m)   Wt 97.5 kg   BMI 36.90 kg/m  Gen:   Awake, no distress   Resp:  Normal effort  MSK:   Moves extremities without difficulty  Other:    Medical Decision Making  Medically screening exam initiated at 8:53 AM.  Appropriate orders placed.  [name] was informed that the remainder of the evaluation will be completed by another provider, this initial triage assessment does not replace that evaluation, and the importance of remaining in the ED until their evaluation is complete.  EKG shows no STEMI.  Will obtain cardiac workup.  Will obtain chest x-ray as well.    Felicia Lavonia SAILOR, MD 09/14/23 [phone]

## 2023-09-14 NOTE — ED Notes (Signed)
 Pt transferred to cath lab on stretcher

## 2023-09-14 NOTE — Progress Notes (Signed)
 TR band removed at 1845, gauze dressing applied. Right radial level 0, clean, dry, and intact.

## 2023-09-14 NOTE — ED Triage Notes (Signed)
 Pt. Stated, I was on the way to work and I started having some chest pain and would not go away, then to my jaw and between my shoulder blades.

## 2023-09-15 ENCOUNTER — Encounter (HOSPITAL_COMMUNITY): Payer: Self-pay | Admitting: Cardiology

## 2023-09-19 DIAGNOSIS — M0589 Other rheumatoid arthritis with rheumatoid factor of multiple sites: Secondary | ICD-10-CM | POA: Diagnosis not present

## 2023-09-21 ENCOUNTER — Other Ambulatory Visit (HOSPITAL_COMMUNITY): Payer: Self-pay

## 2023-09-22 ENCOUNTER — Other Ambulatory Visit: Payer: Self-pay | Admitting: Cardiology

## 2023-09-26 DIAGNOSIS — M1991 Primary osteoarthritis, unspecified site: Secondary | ICD-10-CM | POA: Diagnosis not present

## 2023-09-26 DIAGNOSIS — Z6837 Body mass index (BMI) 37.0-37.9, adult: Secondary | ICD-10-CM | POA: Diagnosis not present

## 2023-09-26 DIAGNOSIS — M79641 Pain in right hand: Secondary | ICD-10-CM | POA: Diagnosis not present

## 2023-09-26 DIAGNOSIS — R110 Nausea: Secondary | ICD-10-CM | POA: Diagnosis not present

## 2023-09-26 DIAGNOSIS — M0589 Other rheumatoid arthritis with rheumatoid factor of multiple sites: Secondary | ICD-10-CM | POA: Diagnosis not present

## 2023-09-26 DIAGNOSIS — G5603 Carpal tunnel syndrome, bilateral upper limbs: Secondary | ICD-10-CM | POA: Diagnosis not present

## 2023-09-26 DIAGNOSIS — E669 Obesity, unspecified: Secondary | ICD-10-CM | POA: Diagnosis not present

## 2023-09-26 DIAGNOSIS — M797 Fibromyalgia: Secondary | ICD-10-CM | POA: Diagnosis not present

## 2023-10-04 ENCOUNTER — Other Ambulatory Visit (HOSPITAL_COMMUNITY): Payer: Self-pay

## 2023-10-04 DIAGNOSIS — M47816 Spondylosis without myelopathy or radiculopathy, lumbar region: Secondary | ICD-10-CM | POA: Diagnosis not present

## 2023-10-04 DIAGNOSIS — K5903 Drug induced constipation: Secondary | ICD-10-CM | POA: Diagnosis not present

## 2023-10-04 DIAGNOSIS — G894 Chronic pain syndrome: Secondary | ICD-10-CM | POA: Diagnosis not present

## 2023-10-04 DIAGNOSIS — M47812 Spondylosis without myelopathy or radiculopathy, cervical region: Secondary | ICD-10-CM | POA: Diagnosis not present

## 2023-10-04 MED ORDER — OXYCODONE HCL 5 MG PO TABS
5.0000 mg | ORAL_TABLET | ORAL | 0 refills | Status: AC | PRN
  Filled 2023-10-25: qty 180, 30d supply, fill #0

## 2023-10-04 MED ORDER — OXYCODONE HCL 5 MG PO TABS
5.0000 mg | ORAL_TABLET | ORAL | 0 refills | Status: AC | PRN
  Filled 2023-11-24: qty 180, 30d supply, fill #0

## 2023-10-17 DIAGNOSIS — M0589 Other rheumatoid arthritis with rheumatoid factor of multiple sites: Secondary | ICD-10-CM | POA: Diagnosis not present

## 2023-10-17 DIAGNOSIS — Z79899 Other long term (current) drug therapy: Secondary | ICD-10-CM | POA: Diagnosis not present

## 2023-10-19 DIAGNOSIS — E7800 Pure hypercholesterolemia, unspecified: Secondary | ICD-10-CM | POA: Diagnosis not present

## 2023-10-19 DIAGNOSIS — R918 Other nonspecific abnormal finding of lung field: Secondary | ICD-10-CM | POA: Diagnosis not present

## 2023-10-19 DIAGNOSIS — K5903 Drug induced constipation: Secondary | ICD-10-CM | POA: Diagnosis not present

## 2023-10-19 DIAGNOSIS — E538 Deficiency of other specified B group vitamins: Secondary | ICD-10-CM | POA: Diagnosis not present

## 2023-10-19 DIAGNOSIS — L509 Urticaria, unspecified: Secondary | ICD-10-CM | POA: Diagnosis not present

## 2023-10-19 DIAGNOSIS — Z23 Encounter for immunization: Secondary | ICD-10-CM | POA: Diagnosis not present

## 2023-10-19 DIAGNOSIS — I10 Essential (primary) hypertension: Secondary | ICD-10-CM | POA: Diagnosis not present

## 2023-10-19 DIAGNOSIS — M069 Rheumatoid arthritis, unspecified: Secondary | ICD-10-CM | POA: Diagnosis not present

## 2023-10-19 DIAGNOSIS — M797 Fibromyalgia: Secondary | ICD-10-CM | POA: Diagnosis not present

## 2023-10-19 DIAGNOSIS — K219 Gastro-esophageal reflux disease without esophagitis: Secondary | ICD-10-CM | POA: Diagnosis not present

## 2023-10-19 DIAGNOSIS — Z6837 Body mass index (BMI) 37.0-37.9, adult: Secondary | ICD-10-CM | POA: Diagnosis not present

## 2023-10-19 DIAGNOSIS — M8580 Other specified disorders of bone density and structure, unspecified site: Secondary | ICD-10-CM | POA: Diagnosis not present

## 2023-10-21 ENCOUNTER — Telehealth: Payer: Self-pay

## 2023-10-21 NOTE — Telephone Encounter (Signed)
   Patient Name: [name]  DOB: 09-Mar-1955 MRN: [id number]  Primary Cardiologist: Oneil Parchment, MD  Chart reviewed as part of pre-operative protocol coverage. Given past medical history and time since last visit, based on ACC/AHA guidelines, [name] is at acceptable risk for the planned procedure without further cardiovascular testing.   Cardiac catheterization, 09/14/2023   The left ventricular systolic function is normal.   LV end diastolic pressure is normal.   The left ventricular ejection fraction is 55-65% by visual estimate.   No significant CAD Normal LV function Normal LVEDP  The patient was advised that if she develops new symptoms prior to surgery to contact our office to arrange for a follow-up visit, and she verbalized understanding.  I will route this recommendation to the requesting party via Epic fax function and remove from pre-op pool.  Please call with questions.  Lamarr Satterfield, NP 10/21/2023, 1:35 PM

## 2023-10-21 NOTE — Telephone Encounter (Signed)
   Pre-operative Risk Assessment    Patient Name: [name]  DOB: 1955/12/13 MRN: [id number]   Date of last office visit: 08/19/23 JACKEE WYN RADDLE., NP Date of next office visit: NONE  Request for Surgical Clearance    Procedure:  COLONOSCOPY  Date of Surgery:  Clearance 11/04/23                                Surgeon:  DR KRISTIE Surgeon's Group or Practice Name:  [facility], [place] Phone number:  [phone] Fax number:  [phone]   Type of Clearance Requested:   - Medical    Type of Anesthesia:  PROPOFOL    Additional requests/questions:    Signed, Lucie DELENA Ku   10/21/2023, 1:22 PM

## 2023-10-25 ENCOUNTER — Other Ambulatory Visit (HOSPITAL_COMMUNITY): Payer: Self-pay

## 2023-10-25 ENCOUNTER — Other Ambulatory Visit: Payer: Self-pay

## 2023-10-26 ENCOUNTER — Other Ambulatory Visit: Payer: Self-pay | Admitting: Internal Medicine

## 2023-10-26 DIAGNOSIS — R918 Other nonspecific abnormal finding of lung field: Secondary | ICD-10-CM

## 2023-11-04 DIAGNOSIS — K635 Polyp of colon: Secondary | ICD-10-CM | POA: Diagnosis not present

## 2023-11-04 DIAGNOSIS — Z800 Family history of malignant neoplasm of digestive organs: Secondary | ICD-10-CM | POA: Diagnosis not present

## 2023-11-04 DIAGNOSIS — D124 Benign neoplasm of descending colon: Secondary | ICD-10-CM | POA: Diagnosis not present

## 2023-11-04 DIAGNOSIS — Z860100 Personal history of colon polyps, unspecified: Secondary | ICD-10-CM | POA: Diagnosis not present

## 2023-11-04 DIAGNOSIS — K648 Other hemorrhoids: Secondary | ICD-10-CM | POA: Diagnosis not present

## 2023-11-04 DIAGNOSIS — Z1211 Encounter for screening for malignant neoplasm of colon: Secondary | ICD-10-CM | POA: Diagnosis not present

## 2023-11-04 LAB — HM COLONOSCOPY

## 2023-11-10 ENCOUNTER — Ambulatory Visit
Admission: RE | Admit: 2023-11-10 | Discharge: 2023-11-10 | Disposition: A | Discharge status: Home / Self Care | Source: Ambulatory Visit | Attending: Internal Medicine | Admitting: Internal Medicine

## 2023-11-10 DIAGNOSIS — R918 Other nonspecific abnormal finding of lung field: Secondary | ICD-10-CM

## 2023-11-10 DIAGNOSIS — J479 Bronchiectasis, uncomplicated: Secondary | ICD-10-CM | POA: Diagnosis not present

## 2023-11-14 DIAGNOSIS — M0589 Other rheumatoid arthritis with rheumatoid factor of multiple sites: Secondary | ICD-10-CM | POA: Diagnosis not present

## 2023-11-24 ENCOUNTER — Other Ambulatory Visit: Payer: Self-pay

## 2023-11-24 ENCOUNTER — Other Ambulatory Visit (HOSPITAL_COMMUNITY): Payer: Self-pay

## 2023-11-29 ENCOUNTER — Other Ambulatory Visit (HOSPITAL_COMMUNITY): Payer: Self-pay

## 2023-11-29 ENCOUNTER — Ambulatory Visit: Admitting: Cardiology

## 2023-11-29 DIAGNOSIS — K5903 Drug induced constipation: Secondary | ICD-10-CM | POA: Diagnosis not present

## 2023-11-29 DIAGNOSIS — G894 Chronic pain syndrome: Secondary | ICD-10-CM | POA: Diagnosis not present

## 2023-11-29 DIAGNOSIS — M5417 Radiculopathy, lumbosacral region: Secondary | ICD-10-CM | POA: Diagnosis not present

## 2023-11-29 DIAGNOSIS — M47816 Spondylosis without myelopathy or radiculopathy, lumbar region: Secondary | ICD-10-CM | POA: Diagnosis not present

## 2023-11-29 MED ORDER — OXYCODONE-ACETAMINOPHEN 5-325 MG PO TABS
1.0000 | ORAL_TABLET | ORAL | 0 refills | Status: AC | PRN
  Filled 2024-01-04: qty 180, 30d supply, fill #0

## 2023-12-12 DIAGNOSIS — M0589 Other rheumatoid arthritis with rheumatoid factor of multiple sites: Secondary | ICD-10-CM | POA: Diagnosis not present

## 2023-12-16 ENCOUNTER — Encounter: Payer: Self-pay | Admitting: Podiatry

## 2023-12-19 ENCOUNTER — Ambulatory Visit (INDEPENDENT_AMBULATORY_CARE_PROVIDER_SITE_OTHER): Admitting: Podiatry

## 2023-12-19 ENCOUNTER — Encounter: Payer: Self-pay | Admitting: Podiatry

## 2023-12-19 DIAGNOSIS — B351 Tinea unguium: Secondary | ICD-10-CM | POA: Diagnosis not present

## 2023-12-19 DIAGNOSIS — M722 Plantar fascial fibromatosis: Secondary | ICD-10-CM | POA: Diagnosis not present

## 2023-12-19 DIAGNOSIS — Z79899 Other long term (current) drug therapy: Secondary | ICD-10-CM

## 2023-12-19 NOTE — Patient Instructions (Signed)
 While at your visit today you received a steroid injection in your foot or ankle to help with your pain. Along with having the steroid medication there is some numbing medication in the shot that you received. Due to this you may notice some numbness to the area for the next couple of hours.   I would recommend limiting activity for the next few days to help the steroid injection take affect.    The actually benefit from the steroid injection may take up to 2-7 days to see a difference. You may actually experience a small (as in 10%) INCREASE in pain in the first 24 hours---that is common. It would be best if you can ice the area today and take anti-inflammatory medications (such as Ibuprofen, Motrin, or Aleve) if you are able to take these medications. If you were prescribed another medication to help with the pain go ahead and start that medication today    Things to watch out for that you should contact us  or a health care provider urgently would include: 1. Unusual (as in more than 10%) increase in pain 2. New fever > 101.5 3. New swelling or redness of the injected area.  4. Streaking of red lines around the area injected.  If you have any questions or concerns about this, please give our office a call at [phone].   Terbinafine Tablets What is this medication? TERBINAFINE (TER bin a feen) treats fungal infections of the nails. It belongs to a group of medications called antifungals. It will not treat infections caused by bacteria or viruses. This medicine may be used for other purposes; ask your health care provider or pharmacist if you have questions. COMMON BRAND NAME(S): Lamisil, Terbinex What should I tell my care team before I take this medication? They need to know if you have any of these conditions: Liver disease An unusual or allergic reaction to terbinafine, other medications, foods, dyes, or preservatives Pregnant or trying to get pregnant Breast-feeding How should I  use this medication? Take this medication by mouth with water. Take it as directed on the prescription label at the same time every day. You can take it with or without food. If it upsets your stomach, take it with food. Keep taking it unless your care team tells you to stop. A special MedGuide will be given to you by the pharmacist with each prescription and refill. Be sure to read this information carefully each time. Talk to your care team about the use of this medication in children. Special care may be needed. Overdosage: If you think you have taken too much of this medicine contact a poison control center or emergency room at once. NOTE: This medicine is only for you. Do not share this medicine with others. What if I miss a dose? If you miss a dose, take it as soon as you can unless it is more than 4 hours late. If it is more than 4 hours late, skip the missed dose. Take the next dose at the normal time. What may interact with this medication? Do not take this medication with any of the following: Pimozide Thioridazine This medication may also interact with the following: Beta blockers Caffeine Certain medications for mental health conditions Cimetidine Cyclosporine Medications for fungal infections, such as fluconazole or ketoconazole Medications for irregular heartbeat, such as amiodarone, flecainide, propafenone Rifampin Warfarin This list may not describe all possible interactions. Give your health care provider a list of all the medicines, herbs, non-prescription drugs, or  dietary supplements you use. Also tell them if you smoke, drink alcohol, or use illegal drugs. Some items may interact with your medicine. What should I watch for while using this medication? Visit your care team for regular checks on your progress. Tell your care team if your symptoms do not start to get better or if they get worse. This medication may cause serious skin reactions. They can happen weeks to  months after starting the medication. Contact your care team right away if you notice fevers or flu-like symptoms with a rash. The rash may be red or purple and then turn into blisters or peeling of the skin. You may also notice a red rash with swelling of the face, lips, or lymph nodes in your neck or under your arms. This medication can make you more sensitive to the sun. Keep out of the sun. If you cannot avoid being in the sun, wear protective clothing and sunscreen. Do not use sun lamps, tanning beds, or tanning booths. What side effects may I notice from receiving this medication? Side effects that you should report to your care team as soon as possible: Allergic reactions--skin rash, itching, hives, swelling of the face, lips, tongue, or throat Change in sense of smell Change in taste Infection--fever, chills, cough, or sore throat Liver injury--right upper belly pain, loss of appetite, nausea, light-colored stool, dark yellow or brown urine, yellowing skin or eyes, unusual weakness or fatigue Low red blood cell level--unusual weakness or fatigue, dizziness, headache, trouble breathing Lupus-like syndrome--joint pain, swelling, or stiffness, butterfly-shaped rash on the face, rashes that get worse in the sun, fever, unusual weakness or fatigue Rash, fever, and swollen lymph nodes Redness, blistering, peeling, or loosening of the skin, including inside the mouth Unusual bruising or bleeding Worsening mood, feelings of depression Side effects that usually do not require medical attention (report to your care team if they continue or are bothersome): Diarrhea Gas Headache Nausea Stomach pain Upset stomach This list may not describe all possible side effects. Call your doctor for medical advice about side effects. You may report side effects to FDA at 1-800-FDA-1088. Where should I keep my medication? Keep out of the reach of children and pets. Store between 20 and 25 degrees C (68 and 77  degrees F). Protect from light. Get rid of any unused medication after the expiration date. To get rid of medications that are no longer needed or have expired: Take the medication to a medication take-back program. Check with your pharmacy or law enforcement to find a location. If you cannot return the medication, check the label or package insert to see if the medication should be thrown out in the garbage or flushed down the toilet. If you are not sure, ask your care team. If it is safe to put it in the trash, take the medication out of the container. Mix the medication with cat litter, dirt, coffee grounds, or other unwanted substance. Seal the mixture in a bag or container. Put it in the trash. NOTE: This sheet is a summary. It may not cover all possible information. If you have questions about this medicine, talk to your doctor, pharmacist, or health care provider.  2024 [employer] Standard (2022-08-27 00:00:00)

## 2023-12-21 NOTE — Progress Notes (Signed)
 Chief Complaint  Patient presents with   Foot Pain    Patient is here for chronic left foot pain    68 year old female presents the office with above concerns.  She states that she is having worsening pain in her left foot.  She does not report any recent injuries or changes otherwise.  She is requesting a steroid injection today.  She gets pain along the fifth MPJ area to the bottom of the heel.  She no other treatment at this time.  She tries to continue with shoes, good arch support.  She is also asking about nail fungus treatment again.  She was previously on Lamisil  and tolerated well.   Objective: AAO x3, NAD DP/PT pulses palpable bilaterally, CRT less than 3 seconds Toenails continue to be mildly hypertrophic, dystrophic nail discoloration.  No edema, erythema. Tenderness to palpation of the left fifth MTPJ area although the metatarsals the previously been resected.  Some localized edema but there is no erythema, warmth or any signs of infection today.  This does not appear to be any worse.  She also gets tenderness on the plantar aspect of calcaneus on insertion of plantar fascia.  Small nodule present along the arch of the foot likely consistent plantar fibroma.  There is no erythema or warmth.  No area pinpoint tenderness otherwise.  MMT 5/5.   No pain with calf compression, swelling, warmth, erythema  Assessment: Bursitis left foot, plantar fasciitis; onychomycosis   Plan: -All treatment options discussed with the patient including all alternatives, risks, complications.  -We discussed possible advanced imaging if needed if symptoms persist. -Today's tear injection was formed to the plantar fascia as well as along the fifth MPJ area.  Verbal consent obtained.  Mixture of 0.5 cc betamethasone  phosphate, 0.5 cc of Marcaine  plain was infiltrated into the fifth MPJ area as well as the plantar fascia without complications.  Postinjection care discussed.  Tolerated well. -Continue  stretching, icing a regular basis as well as supportive shoes, orthotics.  Onychomycosis -She will do oral Lamisil  again for nail fungus.  Distal side effects.  Check CBC, LFT.   Return if symptoms worsen or fail to improve.  [name] Fees DPM

## 2024-01-04 ENCOUNTER — Other Ambulatory Visit (HOSPITAL_COMMUNITY): Payer: Self-pay

## 2024-01-05 ENCOUNTER — Other Ambulatory Visit: Payer: Self-pay | Admitting: Podiatry

## 2024-01-05 DIAGNOSIS — Z79899 Other long term (current) drug therapy: Secondary | ICD-10-CM | POA: Diagnosis not present

## 2024-01-05 DIAGNOSIS — M542 Cervicalgia: Secondary | ICD-10-CM | POA: Diagnosis not present

## 2024-01-06 LAB — HEPATIC FUNCTION PANEL
AG Ratio: 2 (calc) (ref 1.0–2.5)
ALT: 17 U/L (ref 6–29)
AST: 25 U/L (ref 10–35)
Albumin: 4.3 g/dL (ref 3.6–5.1)
Alkaline phosphatase (APISO): 82 U/L (ref 37–153)
Bilirubin, Direct: 0.1 mg/dL (ref 0.0–0.2)
Globulin: 2.1 g/dL (ref 1.9–3.7)
Indirect Bilirubin: 0.3 mg/dL (ref 0.2–1.2)
Total Bilirubin: 0.4 mg/dL (ref 0.2–1.2)
Total Protein: 6.4 g/dL (ref 6.1–8.1)

## 2024-01-06 LAB — CBC WITH DIFFERENTIAL/PLATELET
Absolute Lymphocytes: 502 {cells}/uL — ABNORMAL LOW (ref 850–3900)
Absolute Monocytes: 433 {cells}/uL (ref 200–950)
Basophils Absolute: 23 {cells}/uL (ref 0–200)
Basophils Relative: 0.4 %
Eosinophils Absolute: 23 {cells}/uL (ref 15–500)
Eosinophils Relative: 0.4 %
HCT: 35.2 % (ref 35.0–45.0)
Hemoglobin: 11.2 g/dL — ABNORMAL LOW (ref 11.7–15.5)
MCH: 28.6 pg (ref 27.0–33.0)
MCHC: 31.8 g/dL — ABNORMAL LOW (ref 32.0–36.0)
MCV: 90 fL (ref 80.0–100.0)
MPV: 10.1 fL (ref 7.5–12.5)
Monocytes Relative: 7.6 %
Neutro Abs: 4720 {cells}/uL (ref 1500–7800)
Neutrophils Relative %: 82.8 %
Platelets: 298 Thousand/uL (ref 140–400)
RBC: 3.91 Million/uL (ref 3.80–5.10)
RDW: 13.6 % (ref 11.0–15.0)
Total Lymphocyte: 8.8 %
WBC: 5.7 Thousand/uL (ref 3.8–10.8)

## 2024-01-09 ENCOUNTER — Ambulatory Visit: Payer: Self-pay | Admitting: Podiatry

## 2024-01-09 ENCOUNTER — Other Ambulatory Visit: Payer: Self-pay | Admitting: Podiatry

## 2024-01-09 DIAGNOSIS — M0589 Other rheumatoid arthritis with rheumatoid factor of multiple sites: Secondary | ICD-10-CM | POA: Diagnosis not present

## 2024-01-09 DIAGNOSIS — Z79899 Other long term (current) drug therapy: Secondary | ICD-10-CM

## 2024-01-09 MED ORDER — TERBINAFINE HCL 250 MG PO TABS
250.0000 mg | ORAL_TABLET | Freq: Every day | ORAL | 0 refills | Status: AC
Start: 2024-01-09 — End: ?

## 2024-01-10 ENCOUNTER — Other Ambulatory Visit (HOSPITAL_COMMUNITY): Payer: Self-pay

## 2024-01-10 DIAGNOSIS — M5412 Radiculopathy, cervical region: Secondary | ICD-10-CM | POA: Diagnosis not present

## 2024-01-10 DIAGNOSIS — M47812 Spondylosis without myelopathy or radiculopathy, cervical region: Secondary | ICD-10-CM | POA: Diagnosis not present

## 2024-01-10 DIAGNOSIS — M5417 Radiculopathy, lumbosacral region: Secondary | ICD-10-CM | POA: Diagnosis not present

## 2024-01-10 DIAGNOSIS — M5481 Occipital neuralgia: Secondary | ICD-10-CM | POA: Diagnosis not present

## 2024-01-10 MED ORDER — OXYCODONE-ACETAMINOPHEN 5-325 MG PO TABS
1.0000 | ORAL_TABLET | ORAL | 0 refills | Status: AC
  Filled 2024-02-10 (×2): qty 180, 30d supply, fill #0

## 2024-01-30 ENCOUNTER — Ambulatory Visit: Admitting: Cardiology

## 2024-02-06 DIAGNOSIS — M0589 Other rheumatoid arthritis with rheumatoid factor of multiple sites: Secondary | ICD-10-CM | POA: Diagnosis not present

## 2024-02-10 ENCOUNTER — Other Ambulatory Visit: Payer: Self-pay

## 2024-03-01 ENCOUNTER — Ambulatory Visit: Admitting: Podiatry

## 2024-03-01 ENCOUNTER — Ambulatory Visit (INDEPENDENT_AMBULATORY_CARE_PROVIDER_SITE_OTHER)

## 2024-03-01 ENCOUNTER — Encounter: Payer: Self-pay | Admitting: Podiatry

## 2024-03-01 VITALS — Ht 64.0 in | Wt 215.0 lb

## 2024-03-01 DIAGNOSIS — B351 Tinea unguium: Secondary | ICD-10-CM

## 2024-03-01 DIAGNOSIS — M778 Other enthesopathies, not elsewhere classified: Secondary | ICD-10-CM | POA: Diagnosis not present

## 2024-03-01 DIAGNOSIS — M76821 Posterior tibial tendinitis, right leg: Secondary | ICD-10-CM | POA: Diagnosis not present

## 2024-03-01 DIAGNOSIS — M7751 Other enthesopathy of right foot: Secondary | ICD-10-CM

## 2024-03-01 DIAGNOSIS — M7752 Other enthesopathy of left foot: Secondary | ICD-10-CM

## 2024-03-01 NOTE — Patient Instructions (Signed)

## 2024-03-03 NOTE — Progress Notes (Signed)
 Chief Complaint  Patient presents with   Foot Pain    Pt is here due to chronic bilateral foot pain, she is requesting injection.     69 year old female presents the office with above concerns.  She states that she had pain in both of her feet and she is requesting an injection today.  She points on the left fifth MPJ area as well as the right second interspace where she gets discomfort.  She also gets some pain on the navicular tuberosity, medial aspect of the foot, ankle that she points to on the right side.  She does not report any recent injuries.  She uses topical medications.  She is still taking the terbinafine  for nail fungus and tolerating well with any side effects.  Objective: AAO x3, NAD DP/PT pulses palpable bilaterally, CRT less than 3 seconds Toenails continue to be mildly hypertrophic, dystrophic nail discoloration.  Some clearing on the proximal nail folds.  No edema, erythema. Tenderness to palpation of the left fifth MTPJ area although the metatarsals the previously been resected.  Some localized edema but there is no erythema, warmth or any signs of infection today.  Also tenderness on the right second interspace.  No area of pinpoint tenderness but there is localized edema to this area but there is no erythema or warmth.  Prominence the navicular tuberosity on the right side there is tenderness palpation to this area as well as the distal portion of the posterior tibial tendon.  No erythema or warmth.  There is no area of pinpoint tenderness at the ankles.  No pain with calf compression, swelling, warmth, erythema  Assessment: Bursitis left foot/right foot; PTTD; onychomycosis   Plan: -X-rays obtained reviewed bilaterally.  Multiple views were obtained.  Status post metatarsal head resections of the left foot with first MTPJ arthrodesis and right fifth metatarsal head excision.  The left fifth metatarsal distally there is a prominence noted which could be causing some of  her pain.  There is no evidence of acute fracture otherwise.  Bursitis left, right foot -Patient wishes proceed with an injection.  Mixture 1 cc Kenalog  10, 0.5 cc of Marcaine  plain, 0.5 cc lidocaine  plain was infiltrated to the left fifth MPJ area as well as the right second interspace today without any complications.  Postinjection care discussed.  Tolerated well. -We discussed possible ostectomy of the left fifth metatarsal to help decrease pressure if needed.  Right posterior tibial tendon dysfunction -Continue supportive shoe gear.  Topical anti-inflammatories.  Consider increased prednisone  short-term if needed.  Onychomycosis -Continue Lamisil , monitor for any side effects.  Return if symptoms worsen or fail to improve.  [name] DPM

## 2024-03-06 ENCOUNTER — Other Ambulatory Visit (HOSPITAL_COMMUNITY): Payer: Self-pay

## 2024-03-06 ENCOUNTER — Ambulatory Visit: Admitting: Podiatry

## 2024-03-06 MED ORDER — OXYCODONE-ACETAMINOPHEN 5-325 MG PO TABS: 1.0000 | ORAL_TABLET | ORAL | 0 refills | Status: AC | PRN

## 2024-03-06 MED ORDER — OXYCODONE-ACETAMINOPHEN 5-325 MG PO TABS
1.0000 | ORAL_TABLET | ORAL | 0 refills | Status: AC | PRN
  Filled 2024-03-13: qty 180, 30d supply, fill #0

## 2024-03-07 ENCOUNTER — Other Ambulatory Visit (HOSPITAL_COMMUNITY): Payer: Self-pay

## 2024-03-13 ENCOUNTER — Other Ambulatory Visit: Payer: Self-pay
